# Patient Record
Sex: Male | Born: 1935
Health system: Southern US, Community
[De-identification: ages and names within clinical notes are randomized; demographics above are authoritative.]

## PROBLEM LIST (undated history)

## (undated) DIAGNOSIS — I252 Old myocardial infarction: Secondary | ICD-10-CM

## (undated) DIAGNOSIS — D649 Anemia, unspecified: Secondary | ICD-10-CM

## (undated) DIAGNOSIS — N183 Chronic kidney disease, stage 3 unspecified: Secondary | ICD-10-CM

## (undated) DIAGNOSIS — K219 Gastro-esophageal reflux disease without esophagitis: Secondary | ICD-10-CM

## (undated) DIAGNOSIS — Z87440 Personal history of urinary (tract) infections: Secondary | ICD-10-CM

## (undated) DIAGNOSIS — Z973 Presence of spectacles and contact lenses: Secondary | ICD-10-CM

## (undated) DIAGNOSIS — Z872 Personal history of diseases of the skin and subcutaneous tissue: Secondary | ICD-10-CM

## (undated) DIAGNOSIS — M1990 Unspecified osteoarthritis, unspecified site: Secondary | ICD-10-CM

## (undated) DIAGNOSIS — Z860100 Personal history of colon polyps, unspecified: Secondary | ICD-10-CM

## (undated) DIAGNOSIS — F419 Anxiety disorder, unspecified: Secondary | ICD-10-CM

## (undated) DIAGNOSIS — I5030 Unspecified diastolic (congestive) heart failure: Secondary | ICD-10-CM

## (undated) DIAGNOSIS — I272 Pulmonary hypertension, unspecified: Secondary | ICD-10-CM

## (undated) DIAGNOSIS — I4819 Other persistent atrial fibrillation: Secondary | ICD-10-CM

## (undated) DIAGNOSIS — I714 Abdominal aortic aneurysm, without rupture: Secondary | ICD-10-CM

## (undated) DIAGNOSIS — Z951 Presence of aortocoronary bypass graft: Secondary | ICD-10-CM

## (undated) DIAGNOSIS — E785 Hyperlipidemia, unspecified: Secondary | ICD-10-CM

## (undated) DIAGNOSIS — C61 Malignant neoplasm of prostate: Secondary | ICD-10-CM

## (undated) DIAGNOSIS — Z87448 Personal history of other diseases of urinary system: Secondary | ICD-10-CM

## (undated) DIAGNOSIS — I10 Essential (primary) hypertension: Secondary | ICD-10-CM

## (undated) DIAGNOSIS — R0600 Dyspnea, unspecified: Secondary | ICD-10-CM

## (undated) DIAGNOSIS — N400 Enlarged prostate without lower urinary tract symptoms: Secondary | ICD-10-CM

## (undated) DIAGNOSIS — I340 Nonrheumatic mitral (valve) insufficiency: Secondary | ICD-10-CM

## (undated) DIAGNOSIS — N201 Calculus of ureter: Secondary | ICD-10-CM

## (undated) DIAGNOSIS — I4892 Unspecified atrial flutter: Secondary | ICD-10-CM

## (undated) DIAGNOSIS — R319 Hematuria, unspecified: Secondary | ICD-10-CM

## (undated) DIAGNOSIS — N200 Calculus of kidney: Secondary | ICD-10-CM

## (undated) DIAGNOSIS — I951 Orthostatic hypotension: Secondary | ICD-10-CM

## (undated) DIAGNOSIS — I2510 Atherosclerotic heart disease of native coronary artery without angina pectoris: Secondary | ICD-10-CM

## (undated) DIAGNOSIS — R0609 Other forms of dyspnea: Secondary | ICD-10-CM

## (undated) DIAGNOSIS — I491 Atrial premature depolarization: Secondary | ICD-10-CM

## (undated) DIAGNOSIS — I255 Ischemic cardiomyopathy: Secondary | ICD-10-CM

## (undated) DIAGNOSIS — R911 Solitary pulmonary nodule: Secondary | ICD-10-CM

## (undated) DIAGNOSIS — N210 Calculus in bladder: Secondary | ICD-10-CM

## (undated) DIAGNOSIS — Z978 Presence of other specified devices: Secondary | ICD-10-CM

## (undated) DIAGNOSIS — R011 Cardiac murmur, unspecified: Secondary | ICD-10-CM

## (undated) DIAGNOSIS — J189 Pneumonia, unspecified organism: Secondary | ICD-10-CM

## (undated) HISTORY — PX: CARDIOVASCULAR STRESS TEST: SHX262

## (undated) HISTORY — PX: CORONARY ARTERY BYPASS GRAFT: SHX141

## (undated) HISTORY — PX: INGUINAL HERNIA REPAIR: SUR1180

## (undated) HISTORY — DX: Atherosclerotic heart disease of native coronary artery without angina pectoris: I25.10

## (undated) HISTORY — DX: Gastro-esophageal reflux disease without esophagitis: K21.9

## (undated) HISTORY — DX: Hyperlipidemia, unspecified: E78.5

## (undated) HISTORY — DX: Chronic kidney disease, stage 3 (moderate): N18.3

## (undated) HISTORY — DX: Unspecified atrial flutter: I48.92

## (undated) HISTORY — DX: Nonrheumatic mitral (valve) insufficiency: I34.0

## (undated) HISTORY — DX: Essential (primary) hypertension: I10

## (undated) HISTORY — DX: Chronic kidney disease, stage 3 unspecified: N18.30

## (undated) HISTORY — DX: Orthostatic hypotension: I95.1

## (undated) HISTORY — PX: CARDIAC CATHETERIZATION: SHX172

## (undated) HISTORY — DX: Unspecified diastolic (congestive) heart failure: I50.30

## (undated) HISTORY — DX: Solitary pulmonary nodule: R91.1

## (undated) HISTORY — DX: Anxiety disorder, unspecified: F41.9

---

## 1995-07-29 HISTORY — PX: OTHER SURGICAL HISTORY: SHX169

## 2005-03-30 ENCOUNTER — Inpatient Hospital Stay (HOSPITAL_COMMUNITY): Admission: EM | Admit: 2005-03-30 | Discharge: 2005-04-10 | Payer: Self-pay | Admitting: Emergency Medicine

## 2005-03-30 DIAGNOSIS — I252 Old myocardial infarction: Secondary | ICD-10-CM

## 2005-03-30 HISTORY — DX: Old myocardial infarction: I25.2

## 2005-04-04 DIAGNOSIS — Z951 Presence of aortocoronary bypass graft: Secondary | ICD-10-CM

## 2005-04-04 HISTORY — DX: Presence of aortocoronary bypass graft: Z95.1

## 2006-06-11 ENCOUNTER — Ambulatory Visit (HOSPITAL_COMMUNITY): Admission: RE | Admit: 2006-06-11 | Discharge: 2006-06-11 | Payer: Self-pay | Admitting: Urology

## 2010-07-24 ENCOUNTER — Ambulatory Visit: Payer: Self-pay | Admitting: Cardiology

## 2010-08-16 ENCOUNTER — Ambulatory Visit: Payer: Self-pay | Admitting: Cardiology

## 2010-11-28 ENCOUNTER — Other Ambulatory Visit: Payer: Self-pay | Admitting: Cardiology

## 2010-11-28 DIAGNOSIS — E785 Hyperlipidemia, unspecified: Secondary | ICD-10-CM

## 2010-12-13 NOTE — Op Note (Signed)
NAMEWOLFE, CAMARENA            ACCOUNT NO.:  [id number]   MEDICAL RECORD NO.:  [id number]          PATIENT TYPE:  INP   LOCATION:  2311                         FACILITY:  [facility]   PHYSICIAN:  Salvatore Decent. Cornelius Moras, M.D. DATE OF BIRTH:  [birth year]   DATE OF PROCEDURE:  04/04/2005  DATE OF DISCHARGE:                                 OPERATIVE REPORT   PREOPERATIVE DIAGNOSIS:  Severe three-vessel coronary artery disease, status  post acute non-ST segment elevation myocardial infarction.   POSTOPERATIVE DIAGNOSIS:  Severe three-vessel coronary artery disease,  status post acute non-ST segment elevation myocardial infarction.   PROCEDURE:  Median sternotomy for coronary artery bypass grafting x5 (left  internal mammary artery to distal left anterior descending coronary artery,  saphenous vein graft to ramus intermediate branch, saphenous vein graft to  first circumflex marginal branch and sequential saphenous vein graft to  second left posterolateral branch, saphenous vein graft to posterior  descending coronary artery, endoscopic saphenous vein harvest from right  thigh and right lower leg).   SURGEON:  Salvatore Decent. Cornelius Moras, M.D.   ASSISTANT:  Rowe Clack, P.A.-C.   ANESTHESIA:  General.   BRIEF CLINICAL NOTE:  The patient is a 75 year old male who presented with  an incarcerated left inguinal hernia on March 30, 2005.  He underwent  emergency repair of his inguinal hernia using mesh by Dr. Glenna Fellows.  Immediately following surgery, the patient complained of substernal chest  pain.  EKG findings are equivocal and the patient's symptoms resolved.  However, on serial cardiac enzymes the patient is noted to have ruled in for  an acute non-ST segment elevation myocardial infarction.  The patient is  anticoagulated with heparin.  He remained stable for the ensuing 72 hours  and ultimately he underwent elective cardiac catheterization by Dr. Delfin Edis on September 6.  Findings  at the time of catheterization demonstrate  severe three-vessel coronary artery disease with normal left ventricular  function.  A full consultation note has been dictated previously.  Subsequent to consultation, the patient's heparin is discontinued due to the  presence of hematoma in the patient's left groin and scrotum associated with  his inguinal hernia repair.  He remained entirely stable from a cardiac  standpoint and never had any recurrent episodes of chest pain.  Over the  ensuing 48 hours, the patient has remained clinically stable.  He has been  afebrile with normal white blood count.  There have been signs of bowel  activity, although the patient has not had a bowel movement.  His abdominal  exam has been benign.  No other abnormalities are appreciated.  The patient  is now brought to the operating room for elective surgical  revascularization.   OPERATIVE CONSENT:  The patient has been counseled at length regarding the  indications and potential benefits of coronary artery bypass grafting.  Alternative treatment strategies have been discussed.  The patient  understands and accepts all associated risks of surgery including but not  limited to risk of death, stroke, myocardial infarction, congestive heart  failure, respiratory failure, pneumonia, bleeding requiring blood  transfusion, arrhythmia, infection,  and recurrent coronary artery disease.  The patient also understands that he may be at somewhat increased risk for  postoperative complications due to the presence of a large hematoma in the  left groin and scrotum with recent left inguinal hernia repair and mild  postoperative ileus.  All of his questions have been addressed.   OPERATIVE NOTE IN DETAIL:  The patient is brought to the operating room on  the above-mentioned date and placed in the supine position on the operating  table.  Central monitoring is established by the Anesthesia Service  initially under the care  and direction of Dr. Sharee Holster.  Specifically,  a Swan-Ganz catheter is placed through the right internal jugular approach.  A radial arterial line is placed.  Intravenous antibiotics are administered.  Following induction with general endotracheal anesthesia, the patient's  chest, abdomen, both groins and both lower extremities are prepared and  draped in a sterile manner. The existing Foley catheter that was placed  prior to surgery was left in place and not exchanged due to significant  penile and scrotal edema.   Of note, after induction the patient was noted to develop a low-grade fever  with a temperature of 38.3 degrees centigrade.   Baseline transesophageal echocardiogram is performed by Dr. Jacklynn Bue.  This  demonstrates normal left ventricular function.  There is trivial mitral  regurgitation.  There are no significant wall motion abnormalities.  No  other significant abnormalities are appreciated.   A median sternotomy incision is performed and the left internal mammary  artery is dissected from the chest wall and prepared for bypass grafting.  The left internal mammary artery is good-quality conduit.  Simultaneously saphenous vein is obtained from the patient's right thigh and  the upper portion of the right lower leg using endoscopic vein harvest  incision through a small incision made just below the right knee.  Several  additional incisions are made in the lower leg to facilitate adequate  saphenous vein for conduit.  After the saphenous vein is removed, all of the  small surgical incisions in the right lower extremity are closed in multiple  layers with running absorbable suture.  The saphenous vein conduit is felt  to be good-quality.   The patient is heparinized systemically.  The pericardium is opened.  The  ascending aorta is normal in appearance.  The ascending aorta and the right atrium are cannulated for cardiopulmonary bypass.  Adequate heparinization is  verified.  Cardiopulmonary bypass is begun and  the surface of the heart is inspected.  There is codominant coronary  circulation with a small posterior descending coronary artery arising off  the distal right coronary artery.  All of the remaining left ventricular  branches to the inferior wall and posterolateral wall are branches off the  distal left circumflex coronary artery.  Distal sites are selected for  coronary bypass grafting.  Portions of saphenous vein and left internal  mammary artery are trimmed to appropriate lengths.  A temperature probe is  placed in the left ventricular septum.  A cardioplegia catheter is placed in the ascending aorta.   The patient is cooled to 34 degrees systemic temperature.  The aortic  crossclamp is applied and cardioplegia is delivered in an antegrade fashion  through the aortic root.  Iced saline slush is applied for topical  hypothermia.  The initial cardioplegic arrest and myocardial cooling are  felt to be excellent.  Repeat doses of cardioplegia are administered intermittently throughout the  crossclamp portion  of the operation through the aortic root and down the  subsequently-placed vein grafts to maintain septal temperature less than 15  degrees centigrade.   The following distal coronary anastomoses are performed:  1.  The posterior descending coronary artery is grafted with a saphenous      vein graft in an end-to-side fashion.  This vessel measures 1.4 mm in      diameter and is of fair to good quality at the site of distal bypass.  2.  The first circumflex marginal branch is grafted with a saphenous vein      graft in a side-to-side fashion.  This vessel measures 1.1 mm in diameter and is a fair-quality target.  1.  The second posterolateral branch off the distal left circumflex coronary      artery is grafted using a sequential saphenous vein graft off of the      vein placed at this circumflex marginal branch.  This posterolateral       branch is the largest and terminal-most branch off the distal left      circumflex coronary artery.  At the site of distal bypass it measures      1.8 mm in diameter.  It is a fair to good-quality target.  2.  The ramus intermediate branch is grafted with a saphenous vein graft in      an end-to-side fashion.  This vessel measures 1.8 mm in diameter and is      good quality at the site of distal bypass.  3.  The distal left anterior descending coronary artery is grafted with the      left internal mammary artery in an end-to-side fashion.  This vessel is      diffusely diseased.  At the site of distal bypass it measures 1.7 mm in      diameter.  A 1.0 probe will pass to the apex of the heart  but a 1.5      probe will not due to diffuse disease both proximal and distal to the      level of the distal anastomosis.  This vessel is chronically occluded      proximally.  All three proximal saphenous vein anastomoses are performed directly to the  ascending aorta prior to removal of the aortic crossclamp.  The left  ventricular septal temperature rises rapidly with reperfusion of the left  internal mammary artery.  The aortic crossclamp is removed after a total  crossclamp time of 76 minutes.   The heart began to beat spontaneously without need for cardioversion.  All  proximal and distal coronary anastomoses are inspected for hemostasis and  appropriate graft orientation.  Epicardial pacing wires are fixed right  ventricular outflow tract and to the right atrial appendage.  The patient is  rewarmed to 36.5 degrees centigrade temperature.  The patient is weaned from  cardiopulmonary bypass without difficulty.  The patient's rhythm at  separation from bypass is normal sinus rhythm.  No inotropic support is  required.  Total cardiopulmonary bypass time for the operation is 95  minutes.  Follow-up transesophageal echocardiogram performed by Dr. Gelene Mink after  separation from bypass  demonstrates normal left ventricular function with no  significant abnormalities.   The venous and arterial cannulae are removed uneventfully.  Protamine is  administered to reverse the anticoagulation.  The mediastinum and the left  chest are irrigated with saline solution containing vancomycin.  Meticulous  surgical hemostasis ascertained.  The mediastinum and left chest are  drained  using three chest tubes exited through separate stab incisions inferiorly.  The median sternotomy is closed with single-strength sternal wire after  loosely reapproximating the pericardium anteriorly.  The soft tissues  anterior to the sternum are closed in multiple layers and the skin is closed  with a running subcuticular skin closure.   The patient tolerated the procedure well and is transported to the surgical  intensive care unit in stable condition.  There are no intraoperative  complications.  All sponge, instrument and needle counts are verified  correct at completion of the operation.  The patient was transfused total of  4 units packed red blood cells during cardiopulmonary bypass due to anemia  which is present preoperatively and exacerbated by surgery.      Salvatore Decent. Cornelius Moras, M.D.  Electronically Signed     CHO/MEDQ  D:  04/04/2005  T:  04/04/2005  Job:  [id number]   cc:   Colleen Can. Deborah Chalk, M.D.  Fax: [phone]   Lorne Skeens. Hoxworth, M.D.  [address]  [place]  [place] [address]   Meindert Lacie Scotts  [address]  [place]  [place] [address]  Fax: [phone]

## 2010-12-13 NOTE — Op Note (Signed)
NAMEHARSHIL, CAVALLARO NO.:  [id number]   MEDICAL RECORD NO.:  [id number]          PATIENT TYPE:  INP   LOCATION:  2399                         FACILITY:  [facility]   PHYSICIAN:  Burna Forts, M.D.DATE OF BIRTH:  [birth year]   DATE OF PROCEDURE:  04/04/2005  DATE OF DISCHARGE:                                 OPERATIVE REPORT   PROCEDURE:  Interoperative transesophageal echocardiogram.   INDICATIONS FOR PROCEDURE:  Mr. Foerster is a 75 year old gentleman who  underwent left inguinal hernia repair and was subsequently found to have  severe three vessel coronary artery disease.  He presents today for coronary  artery bypass grafting by Dr. Tressie Stalker who has requested that we place  a TEE probe for evaluation for cardiac structures and function.   DESCRIPTION OF PROCEDURE:  The patient was taken to the operating room for  routine induction of general anesthesia whereupon the trachea was intubated.  Following this, the TEE probe was lubricated and passed oropharyngeally into  the stomach and withdrawn for imaging of the cardiac structures.   PRE-CORONARY BYPASS EXAMINATION:   Left ventricle:  This is normal left ventricular chamber size, function, and  shape.  Short axis views revealed good overall contractility, good segmental  wall contractility in all aspects.  Papillary muscles are well outlined.  Long axis views, again, revealed good inferior and good anterior  contractility in the left ventricular wall well down into the apex.  Good  overall contractility noted.   Mitral valve:  This is a normal, compliant, mobile mitral valve apparatus.  Thin, compliant mobile leaflets are noted.  There is trace mitral  regurgitant flow.   Left atrium:  Normal left atrial chamber.   Aortic valve:  Thin, compliant, trileaflet aortic valve, opened well during  systolic contraction, there is no systolic obstruction to flow nor is there  any aortic insufficiency  noted.   Right ventricle, tricuspid valve, and the right atrium are entirely normal.  This is an entirely normal exam.  The patient will be subsequently bypassed  by Dr. Cornelius Moras.  The rest of the dictation, if any, will be followed by Dr.  Gelene Mink in the post bypass period.           ______________________________  Burna Forts, M.D.     JTM/MEDQ  D:  04/04/2005  T:  04/04/2005  Job:  [id number]

## 2010-12-13 NOTE — Cardiovascular Report (Signed)
NAMEDREUX, MCGROARTY NO.:  [id number]   MEDICAL RECORD NO.:  [id number]          PATIENT TYPE:  INP   LOCATION:  2923                         FACILITY:  [facility]   PHYSICIAN:  Colleen Can. Deborah Chalk, M.D.DATE OF BIRTH:  [birth year]   DATE OF PROCEDURE:  04/02/2005  DATE OF DISCHARGE:                              CARDIAC CATHETERIZATION   HISTORY:  Mr. Zwart presented initially with incarcerated left inguinal  hernia. In the immediate postoperative phase, he had chest pain and had rise  in his cardiac enzymes consistent with subendocardial infarction. He was  placed on heparin with plans to proceed on with cardiac catheterization. He  developed a left groin hematoma over the last 12 to 24 hours and presented  to the catheterization lab for elective catheterization but with a prominent  left groin hematoma. He is now referred for elective catheterization.   PROCEDURE:  Left heart catheterization with selective coronary angiography,  left ventricular angiography with AngioSeal.   TYPE AND SITE OF ENTRY:  Percutaneous right femoral artery with AngioSeal.   CATHETERS:  A 6-French 4 curved Judkins right and left coronary catheter, 6-  French pigtail ventriculographic catheter.   CONTRAST MATERIAL:  Omnipaque.   MEDICATIONS:  Given prior procedure, Valium 10 milligrams p.o.   MEDICATIONS:  Given during the procedure, Versed 2 milligrams IV, Ancef 1  gram IV.   COMMENT:  The patient tolerated the procedure well.   HEMODYNAMIC DATA:  The aortic pressure was 80/60. LV was 100/2-6. There is  no aortic valve gradient on pullback.   ANGIOGRAPHIC DATA:  1.  Left main coronary artery had 30-40% left main stenosis. It is somewhat      diffuse.  2.  Left circumflex: Left circumflex was large and codominant. It had a 60-      70% proximal lesion left circumflex at the level of a smaller obtuse      marginal.  3.  Intermediate:  Intermediate is a relatively large branch  with multiple      distal branches. It had 95% proximal stenosis.  4.  Left anterior descending:  Left anterior descending was totally occluded      proximally. There is retrograde collaterals from the left system. It      would be bypassable.  5.  Right coronary artery:  The right coronary artery has a 99% stenosis      with thrombus present. It is TIMI grade III flow into bypassable      pressure lateral branch.   Left ventricular function was essentially normal. The ejection fraction is  55%.   AngioSeal was used for right groin with satisfactory results.   OVERALL IMPRESSION:  1.  Critical three-vessel coronary disease.  2.  Essentially normal left ventricular function.   DISCUSSION:  I think Mr. Schaffert is in a critical situation. We have had  consultation with general surgery as well as with CVTS. It is a feeling that  emergency bypass surgery would be too risky with his groin hematoma and that  it is best to do the surgery on an elective basis. We will try to manage  him  with IV fluids appropriate and transfusions as needed. Heparin and  anticoagulation will be very problematic because of the significance of the  hematoma in the left groin. The high risk situation will be discussed with  the patient's family.      Colleen Can. Deborah Chalk, M.D.  Electronically Signed     SNT/MEDQ  D:  04/02/2005  T:  04/02/2005  Job:  [id number]

## 2010-12-13 NOTE — Consult Note (Signed)
NAMERONI, SCOW NO.:  [id number]   MEDICAL RECORD NO.:  [id number]          PATIENT TYPE:  INP   LOCATION:  2923                         FACILITY:  [facility]   PHYSICIAN:  Salvatore Decent. Cornelius Moras, M.D. DATE OF BIRTH:  25-Nov-1935   DATE OF CONSULTATION:  04/02/2005  DATE OF DISCHARGE:                                   CONSULTATION   REQUESTING PHYSICIAN:  Dr. Delfin Edis.   REASON FOR CONSULTATION:  Severe three-vessel coronary artery disease,  status post non ST-segment elevation myocardial infarction.   HISTORY OF PRESENT ILLNESS:  Mr. Trentman is a 75 year old white male with  no previous history of coronary artery disease but risk factors notable for  history of hypertension and hyperlipidemia. The patient states in retrospect  that he has been having occasional episodes of substernal chest discomfort  off and on for at least six months. His wife also interjects that several  weeks ago he had a prolonged episode of substernal chest pain radiating down  his left arm. He did not seek medical attention for it at that time and he  blamed his symptoms on his presumed hiatal hernia. Mr. Goudeau is also  known to have a left inguinal hernia for several months but elected not to  have elective surgical repair. On September3, 2006, the patient presented  with an incarcerated left inguinal hernia and he was taken to the operating  room emergently by Dr. Johna Sheriff for repair of his inguinal hernia. In the  postoperative recovery, early following surgery, Mr. Barthelemy developed  substernal chest pain. A 12-lead electrocardiograms revealed some mild and  nonspecific ST-segment changes but no significant acute changes were  appreciated. However, Mr. Mccook did rule in for an acute non-ST-segment  myocardial infarction by serial cardiac enzymes. His peak cardiac enzymes  were notable for a total CK of 888 with a CK-MB fraction of 59.9. The  troponin-I peaked at 6.3. Since  that period time, Mr. Specht has remained  entirely stable from a clinical standpoint with no further episodes of chest  pain and the last set of cardiac enzymes were trending down towards normal.  He was brought for elective cardiac catheterization today by Dr. Deborah Chalk.  Findings at the time of catheterization demonstrate severe three-vessel  coronary artery disease with normal left ventricular function. Emergent  cardiac surgical consultation was requested to consider emergent surgical  revascularization.   REVIEW OF SYSTEMS:  GENERAL: Mr. Morad reports that he has otherwise felt  reasonably well recently. He has good appetite and has not been gaining or  losing weight. CARDIAC: The patient does admit in retrospect of having  exertional chest pain for at least six months and it sounds as though he may  have had a prolonged episode of chest pain occurring at rest several weeks  ago according to his wife. He denies shortness of breath with activity or at  rest. He denies productive cough, hemoptysis, PND, orthopnea, or lower  extremity edema. He denies palpitations or syncope. RESPIRATORY: Notable for  the absence of productive cough, hemoptysis, wheezing. GASTROINTESTINAL:  Notable for the absence of significant problems  prior to this week. However,  since his recent hernia surgery repair, Mr. Kosman has yet to develop  bowel function. He claims that he did pass a small amount of flatus this  morning but he has not had a bowel movement. He has been tolerating small  amounts of clear liquids fluids p.o. without nausea and vomiting.  MUSCULOSKELETAL: Negative. NEUROLOGIC: Negative. GENITOURINARY: Notable for  history of prostate cancer, although Mr. Carll has been out ambulating  and voiding reportedly without difficulty over the last two days. HEENT:  Negative. INFECTIOUS: Negative.   PAST MEDICAL HISTORY:  1.  Hypertension.  2.  Hyperlipidemia.  3.  Prostate cancer, status  post prostatectomy followed by radiation seed      implantation.  4.  Anxiety.  5.  Left inguinal hernia, status post emergent inguinal hernia repair using      mesh September 3rd by Dr. Johna Sheriff for incarcerated inguinal hernia      (without compromise to the intestines and without need for valve      resection).   FAMILY HISTORY:  Noncontributory. Notable for the absence of premature  coronary artery disease.   MEDICATIONS PRIOR TO ADMISSION:  1.  Aspirin.  2.  Prevacid.  3.  Lotrel.  4.  Lovastatin.  5.  Celebrex.   DRUG ALLERGIES:  None known.   PHYSICAL EXAMINATION:  The patient is a well-appearing male who appears his  stated age in no acute distress. He is currently in sinus rhythm with normal  blood pressure. He is resting comfortably and denies any chest pain.  Auscultation of chest reveals clear and symmetrical breath sounds  bilaterally. No wheezes or rhonchi noted. Cardiovascular exam includes  regular rate and rhythm. No murmurs, rubs or gallops noted. The abdomen is  soft and nontender. There are a few bowel sounds, although they are slightly  hypoactive. There is no tenderness or guarding. There is a clean, dry and  intact surgical incision from recent left inguinal hernia repair. There is  moderate amount of swelling with moderate-sized hematoma extending down into  the scrotum. The extremities are warm and well-perfused. There is no lower  extremity edema. Distal pulses are palpable in both lower legs at the ankle.  Rectal and GU exam are both deferred. Neurologic examination is grossly  nonfocal. The skin is clean, dry healthy-appearing throughout.   DIAGNOSTIC TEST:  Cardiac catheterization performed by Dr. Deborah Chalk is  reviewed. This demonstrates severe three-vessel coronary artery disease with  normal left ventricular function. Specifically, there is 100% proximal  occlusion of the left anterior descending coronary artery. This appears chronic with diffusely  diseased distal left anterior descending coronary  artery that can be seen filling via chronic collaterals through the left  circumflex system. The left circumflex coronary artery is a very large and  dominant vessel that gives rise to a medium-sized first circumflex marginal  branch. There is 60% stenosis in this vessel. There is a few small  circumflex marginal branch beyond the first circumflex marginal branch and  then two very large posterolateral branches off the distal left circumflex  coronary artery that appeared to perfuse the majority of the inferior wall  of the left ventricle. There is no significant disease in these vessels. The  right coronary artery was codominant and relatively small perfusing a small  posterior descending coronary artery. There is subtotal occlusion of the  right coronary artery with sluggish flow in this vessel, TIMI grade I to II.  The distal right  coronary artery and/or posterior descending coronary artery  probably will be graftable although it appears relatively small. Left  ventricular function appears normal with no significant wall motion  abnormalities.   IMPRESSION:  Severe three-vessel coronary artery disease status post recent  non ST-segment elevation acute myocardial infarction during the immediate  postoperative recovery phase following emergent repair of incarcerated  inguinal hernia three days ago. Mr. Sacks has remained entirely stable  from a cardiac standpoint since then with no further episodes of chest pain  and no acute changes on 12-lead electrocardiogram. He does have critical  coronary anatomy that would be unfavorable for percutaneous coronary  intervention and I believe in the long run this coronary artery disease  would best be treated by surgical revascularization. He has had recent  emergency surgical repair of incarcerated inguinal hernia. He does appear to  have a moderate size hematoma in the left groin associated with  this hernia  repair, likely secondary to the recent emergency surgery with subsequent  anticoagulation with heparin. However, this hematoma is not tense and the  swelling is not unexpected under the circumstances. The time course for the  development of this hematoma is not clear.  Mr. Rabine has had a mild  postoperative ileus which is not unexpected under the circumstances, but his  bowel function has not yet returned towards normal. I do not feel that it  would be in Mr. Barbone's best interest to rush him off for emergency  surgical revascularization. However, I do agree that coronary artery bypass  grafting will be the best way to treat his coronary disease and it certainly  must be done prior to hospital discharge.  Percutaneous coronary  intervention for the subtotal occlusion of the right coronary artery is an  option, particularly if the patient were to develop signs of unstable post- infarction angina or acute coronary occlusion.  However, I do not feel this  is neccessary at the present time.   RECOMMENDATIONS:  I recommend close observation in the cardiac intensive  care unit. I suspect that stopping heparin will not exacerbate his cardiac  status, but I will defer decisions regarding anticoagulation at present to  discussions between Dr. Deborah Chalk and Dr. Johna Sheriff. Mr. Capers will require  a large dose of heparin and full anticoagulation when he goes to the  operating room for surgical revascularization. Once he is stabilized from  this standpoint of his inguinal hernia repair, we can consider elective  coronary artery bypass grafting. Certainly, if he develops signs of cardiac  instability we may need to think about proceeding with coronary bypass  surgery sooner, although PCI of the RCA might be more prudent should that  occur. We will follow along closely.      Salvatore Decent. Cornelius Moras, M.D.  Electronically Signed     CHO/MEDQ  D:  04/02/2005  T:  04/03/2005  Job:   [id number]   cc:   Colleen Can. Deborah Chalk, M.D.  Fax: [phone]   Lorne Skeens. Hoxworth, M.D.  [address], [address]  [place]  [place] [address]   Meindert Lacie Scotts  [address]  [place]  [place] [address]  Fax: [phone]

## 2010-12-13 NOTE — Discharge Summary (Signed)
NAMEGABRIEN, MENTINK NO.:  [id number]   MEDICAL RECORD NO.:  [id number]          PATIENT TYPE:  INP   LOCATION:  2037                         FACILITY:  [facility]   PHYSICIAN:  Salvatore Decent. Cornelius Moras, M.D. DATE OF BIRTH:  10-Oct-1935   DATE OF ADMISSION:  03/30/2005  DATE OF DISCHARGE:  04/10/2005                                 DISCHARGE SUMMARY   HISTORY OF THE PRESENT ILLNESS:  The patient is a 75 year old male with a  known inguinal hernia on the left side for several years.  He noted a bulge  that was intermittent, sometimes uncomfortable, but decided not to seek  medical treatment.  He presented to the emergency room on the date of  admission with a several-hour history of a persistent painful mass in the  left groin.  There was no nausea or vomiting, no urinary difficulties.  Prompt surgical consultation was obtained with Dr. Johna Sheriff, whose  assessment included incarcerated left inguinal hernia and the patient was to  be taken to the operating room for emergency repair under general  anesthesia.   PAST MEDICAL HISTORY:  1.  History of prostate cancer, status post radioactive seed implant.  2.  Hypertension.  3.  Hypercholesterolemia.  4.  Anxiety.   MEDICATIONS PRIOR TO ADMISSION:  Aspirin, Prevacid, Lotrel, Lovastatin and  Celebrex.   ALLERGIES:  Unknown pain medication.   FAMILY HISTORY:  Please see the history and physical done at the time of  admission.   SOCIAL HISTORY:  Please see the history and physical done at the time of  admission.   REVIEW OF SYSTEMS:  Please see the history and physical done at the time of  admission.   PHYSICAL EXAMINATION:  Please see the history and physical done at the time  of admission.   HOSPITAL COURSE:  The patient was admitted through the emergency room and  taken to the operating room, where he underwent a repair of an incarcerated  left inguinal hernia with a Parietex mesh by Dr. Johna Sheriff.  There were no  complications and the patient presented to the postanesthesia care unit,  however, with complaints of substernal and epigastric burning-type pain.  An  EKG was obtained as well as Cardiology consultation by Dr. Lemmie Evens.  Impression was chest pain, rule out cardiac ischemia, and plan was outlined  to include telemetry, cardiac enzymes, aspirin, intravenous nitroglycerin,  low-dose Lovenox, after clearing with Dr. Johna Sheriff, metoprolol and  consultation with Dr. Roger Shelter.  The patient was noted to have  elevation of troponin and CK isoenzymes to a peak in troponin I of 1.39 and  a CK-MB of 43.2.  He was stabilized medically, placed on heparin and felt to  require cardiac catheterization, and this was undertaken on April 02, 2005; this was performed by Dr. Deborah Chalk.  The patient subsequently also  developed a large inguinal hernia site hematoma as well as scrotal hematoma  that necessitated the discontinuation of heparin.  Findings at  catheterization revealed the following:  #1 - Left main coronary artery with  a 30% to 40% stenosis.  #2 - The left  circumflex with a 60% to 70% proximal  circumflex and it was a codominant vessel.  #3 - The intermediate vessel had  a 95% stenosis.  #4 - The LAD had 100% proximal with retrograde collateral  flow from the left side.  #5 - The right coronary artery had a 99% stenosis  with evidence of thrombus.  Due to these findings, it was felt that surgical  revascularization was warranted and consultation was obtained with Dr.  Tressie Stalker, who evaluated the patient and studies, and agreed with the  recommendations.  The patient also was noted to have had difficulty in  placing of the Foley catheter and consultation was obtained with Dr. Isabel Caprice  on April 02, 2005.  After the patient was deemed to be medically stable  and all preoperative evaluation was undertaken, it was felt he was able to  proceed with the cardiac surgery and on  April 04, 2005, he underwent the  following procedure:  Coronary artery bypass grafting x5.  The following  grafts were placed:  #1 - Left internal mammary artery to the LAD, #2 -  saphenous vein graft to the intermediate, #3 - sequential saphenous vein  graft to the obtuse marginal 1 and the left posterolateral #2, and #4 -  saphenous vein graft to the posterior descending.  The patient tolerated the  procedure well and was taken to the surgical intensive care unit in stable  condition.   POSTOPERATIVE HOSPITAL COURSE:  The patient has done well, maintaining  stable hemodynamics.  He did have some sinus tachycardia and beta blocker  was adjusted with good improvement.  The patient had all routine lines,  monitors and drainage devices discontinued through the standard fashion.  His hematoma related to his hernia surgery has shown a good and gradual  improvement with decrease in ecchymosis and swelling.  Followup  Genitourinary consultation with Dr. Isabel Caprice was also obtained and other  issues have presented.  He is noted to have a renal calculus which appears  to be chronic and can be treated as an outpatient.  A CT scan was obtained  in regards to this and it was not felt to require any urgent followup.  The  CT did, however, show some findings that may be consistent with metastatic  implants related to his prostate cancer.  A bone scan is to be obtained on  April 09, 2005 prior to discharge to see if there are any metastatic  bony foci.  This patient is voiding with the Foley catheter out without  difficulty.  He also ruled out on evaluation for deep venous thrombosis by  Duplex and CT scan.  The patient is overall felt to be quite stable for  discharge on today's date, again following the bone scan, April 10, 2005.   MEDICATIONS ON DISCHARGE:  1.  Aspirin 325 mg daily.  2.  Lopressor 50 mg b.i.d.  3.  Lovastatin 20 mg nightly.  4.  Prevacid 30 mg daily. 5.  Lasix 40 mg  five times daily.  6.  Potassium chloride 20 mEq daily x5 days.  7.  Diazepam as previously taken at home.  8.  Tylox one or two every 4-6 hours as needed for pain.   INSTRUCTIONS:  The patient received written instructions regarding  medications, activity, diet, wound care and followup.   FOLLOWUP:  Followup will include Dr. Johna Sheriff on April 25, 2005 at 3:20  p.m.  Additionally, he is instructed to follow up with Dr. Ellin Goodie  office  and Dr. Ronnald Nian office as written in his instructions.  Additionally, he  will see Dr. Cornelius Moras on April 28, 2005 at 2 p.m.   FINAL DIAGNOSES:  1.  Admission for incarcerated left inguinal hernia, status post repair with      mesh.  2.  Perioperative myocardial infarction.  3.  Severe three-vessel coronary artery disease, status post non-ST-segment-      elevation myocardial infarction.  4.  Hypertension.  5.  Hyperlipidemia.  6.  Prostate cancer with possible metastatic disease to be followed up as an      outpatient with Dr Isabel Caprice.  7.  Left inguinal hernia site repair hematoma secondary to systemic      heparinization, improving.  8.  Anxiety.  9.  Renal calculi.      Rowe Clack, P.A.-C.      Salvatore Decent. Cornelius Moras, M.D.  Electronically Signed    WEG/MEDQ  D:  04/10/2005  T:  04/10/2005  Job:  [id number]   cc:   Lorne Skeens. Hoxworth, M.D.  [address]  [place]  [place] [address]   Salvatore Decent. Cornelius Moras, M.D.  [address]  [place]  [place] [address]   Colleen Can. Deborah Chalk, M.D.  Fax: [phone]   Valetta Fuller, M.D.  [address]  [place]  [place] [address]  Fax: [phone]

## 2010-12-13 NOTE — Consult Note (Signed)
NAMEARDIE, MCLENNAN NO.:  [id number]   MEDICAL RECORD NO.:  [id number]          PATIENT TYPE:  INP   LOCATION:  4702                         FACILITY:  [facility]   PHYSICIAN:  Ulyses Amor, MD DATE OF BIRTH:  [birth year]   DATE OF CONSULTATION:  03/30/2005  DATE OF DISCHARGE:                                   CONSULTATION   Carl [name] is a 75 year old white man who underwent repair of an  incarcerated inguinal hernia today.  In the recovery room he began to  experience chest pain.  Over the ensuing hours, his chest pain waxed and  waned.  The chest pain is described as a pressure along the length of the  sternum.  It has not radiated.  It is not associated with dyspnea,  diaphoresis, or nausea. There are no exacerbating or ameliorating factors.  It appears not be related to position, activity, meals, or respirations.  It  has subsided somewhat since earlier this evening.   The patient has no past history of cardiac disease, including no history of  chest pain, myocardial infarction, coronary artery disease, congestive heart  failure, or arrhythmia.  He has a number of risk factors for coronary artery  disease including hypertension and dyslipidemia.  There is no history of  diabetes mellitus, smoking, or family history of early coronary artery  disease.   The patient has only one other medical problem.  That is a history of  prostate cancer.   MEDICATIONS:  1.  Aspirin.  2.  Prevacid.  3.  Lotrel.  4.  Lovastatin.  5.  Celebrex.   ALLERGIES:  Unknown pain medicine.   PAST SURGICAL HISTORY:  Prostate surgery.   SOCIAL HISTORY:  The patient is a retired Consulting civil engineer.  He lives with  his wife.  He does not smoke cigarettes.   REVIEW OF SYSTEMS:  No problems related to his head, eyes, ears, nose,  mouth, throat, lungs, gastrointestinal system, genitourinary system, or  extremities.  There is no neurologic or psychiatric disorder.  There is  no  history of fever, chills, or weight loss.   PHYSICAL EXAMINATION:  VITAL SIGNS:  Blood pressure 143/85, pulse 114 and  regular, temperature 19.0, respirations 20.  GENERAL:  The patient was an older white man in no discomfort.  She was  alert, oriented, appropriate, and responsive.  HEENT:  Normal.  NECK:  Without thyromegaly or adenopathy.  Carotid pulses were palpable  bilaterally and without bruits.  CARDIOVASCULAR:  Normal S1 and S2.  There was no S3, S4, murmur, rub, or  click.  Cardiac rhythm is regular.  No chest wall tenderness was noted.  LUNGS:  Clear.  ABDOMEN:  Soft and nontender.  There was no mass, hepatosplenomegaly,  bruits, extension, rebound, guarding, or rigidity.  RECTAL:  GENITOURINARY:  Not performed as they were not pertinent to the  reason for acute care hospitalization.  EXTREMITIES:  Without edema, deviation, or deformity.  Radial and dorsalis  pedal pulses were palpable bilaterally.  NEUROLOGY:  Brief screening neurologic survey was unremarkable.   Electrocardiogram revealed sinus tachycardia.  There were  Q waves in leads 3  and aVF which were of uncertain significance, but the possibility of a prior  inferior myocardial infarction could not be excluded.  There were no changes  specific for ischemia or infarction.   The initial set of cardiac enzymes revealed a CK of 135, CK-MB 5.7, troponin  0.02.  Another set of cardiac enzymes revealed a total CK of 261, CK-MB  11.5, and troponin 0.07.  No other labs are available at this time.   IMPRESSION:  1.  Chest pain, rule out cardiac ischemia.  2.  Status post repair of incarcerated inguinal hernia today.  3.  Hypertension.  4.  Dyslipidemia.  5.  Status post prostate cancer.   PLAN:  1.  Telemetry.  2.  Serial cardiac enzymes.  3.  Aspirin.  4.  Intravenous nitroglycerin.  5.  Low dose Lovenox (cleared by Sharlet Salina T. Hoxworth, M.D.)  6.  Metoprolol.  7.  Further measures per Colleen Can. Deborah Chalk,  M.D.      Ulyses Amor, MD  Electronically Signed     MSC/MEDQ  D:  03/31/2005  T:  03/31/2005  Job:  [id number]   cc:   Colleen Can. Deborah Chalk, M.D.  Fax: [phone]

## 2010-12-13 NOTE — H&P (Signed)
NAMEJAYDEN, [name]            ACCOUNT NO.:  [id number]   MEDICAL RECORD NO.:  [id number]          PATIENT TYPE:  INP   LOCATION:  1826                         FACILITY:  [facility]   PHYSICIAN:  Carl [name], M.D.DATE OF BIRTH:  [birth year]   DATE OF ADMISSION:  03/30/2005  DATE OF DISCHARGE:                                HISTORY & PHYSICAL   CHIEF COMPLAINT:  Painful lump left groin.   HISTORY OF PRESENT ILLNESS:  Carl [name] is a 75 year old white male  who has had a known inguinal hernia on the left side for several years. He  has had a bulge that has come and gone occasionally, uncomfortable, but had  decided not to seek medical treatment. He now presents in the emergency room  with a several-hour history of a persistent painful mass in the left groin.  There is no nausea or vomiting. No urinary difficulties.   PAST MEDICAL HISTORY:  1.  History of cancer of the prostate status post seed implant.  2.  He is also treated for hypertension.  3.  Elevated cholesterol.  4.  Anxiety.   MEDICATIONS:  Aspirin, Prevacid, Lotrel, Lovastatin, Celebrex.   ALLERGIES:  UNKNOWN PAIN MEDICATION.   SOCIAL HISTORY:  Married. Denies cigarette or alcohol use.   FAMILY HISTORY:  Noncontributory.   REVIEW OF SYSTEMS:  GENERAL:  No fever, chills, weight change. RESPIRATORY:  No shortness of breath, cough, wheezing, history of lung disease. CARDIAC:  No chest pain, palpitations, swelling. No history of heart disease. ABDOMEN:  No abdominal pain, nausea, vomiting. GU:  No urinary burning or frequency.   PHYSICAL EXAMINATION:  VITAL SIGNS:  He is afebrile. Vital signs are within  normal limits.  GENERAL:  Mildly overweight white male in no acute distress.  SKIN:  Warm and dry, no rash or infection.  HEENT:  No palpable masses or thyromegaly. Sclerae nonicteric. Nares,  oropharynx clear.  LUNGS:  Clear to auscultation, no wheeze or increased work of breathing.  CARDIAC:  Regular  rate and rhythm, no murmurs, no edema.  ABDOMEN:  Nondistended, soft, nontender. There is an approximately 8 cm  tender palpable mass in the left inguinal canal consistent with incarcerated  hernia.  GU:  Normal genitalia.  EXTREMITIES:  No joint swelling or deformity.  NEUROLOGIC:  Alert and oriented, able to understand and answer questions,  grossly normal.   ASSESSMENT/PLAN:  Incarcerated left inguinal hernia. The patient is being  taken to the operating room for emergency repair under general anesthesia.      Lorne Skeens. [name], M.D.  Electronically Signed     BTH/MEDQ  D:  03/30/2005  T:  03/30/2005  Job:  [id number]

## 2010-12-13 NOTE — Op Note (Signed)
Carl [name], Carl [name]            ACCOUNT NO.:  [id number]   MEDICAL RECORD NO.:  [id number]          PATIENT TYPE:  INP   LOCATION:  1826                         FACILITY:  [facility]   PHYSICIAN:  Sharlet Salina T. Hoxworth, M.D.DATE OF BIRTH:  1935-09-23   DATE OF PROCEDURE:  03/30/2005  DATE OF DISCHARGE:                                 OPERATIVE REPORT   PREOPERATIVE DIAGNOSIS:  Incarcerated left inguinal hernia.   POSTOPERATIVE DIAGNOSIS:  Incarcerated left inguinal hernia.   SURGICAL PROCEDURE:  Repair of incarcerated left inguinal hernia with mesh.   SURGEON:  Lorne Skeens. Hoxworth, M.D.   ANESTHESIA:  General.   BRIEF HISTORY:  [name] is a 75 year old male with a known left  inguinal hernia for several years.  He now presents with a persistent  painful mass in the left groin of several hours' duration. Examination shows  a tender mass in left groin consistent with an incarcerated left inguinal  hernia. There is no evidence of bowel obstruction on history, exam, or  abdominal x-ray. I have recommended proceeding with emergency repair under  general anesthesia using mesh.  The nature of the procedure, indications,  risks of bleeding, infection, recurrence, and possible need for bowel  resection were discussed with the patient's family preoperatively.  He is  now brought to the operating room for this procedure.   DESCRIPTION OF OPERATION:  The patient was brought to the operating room,  placed in the supine position on the operating table, and general  endotracheal anesthesia was induced. He received preoperative IV  antibiotics. PAS were placed. The left groin was widely sterilely prepped  and draped with Ioban. Correct patient and procedure were verified. I made  an oblique incision in the left groin over the mass, and dissection was  carried down through  the subcutaneous tissue and Scarpa's fascia.  Dissection was deepened down to the external oblique which was cleared  down  to the external ring and inguinal ligament. There was a large mass at the  external ring. The external oblique was divided through the ring along the  lines of its fibers. The spermatic cord was then unable to be dissected up  off the floor of the pubic tubercle and encircled with a Penrose drain.  There was a good-sized hernia sac through the indirect space. As I dissected  this away from the cord, the contents spontaneously reduced. The indirect  sac was dissected away from cord structures up to and above the level of the  internal ring. I opened the sac at this point. There was just a little bit  of clear fluid. There was a small sliding component with appendix epiploica  from the sigmoid colon, and the sigmoid colon was adjacent, and I suspect  that the hernia had contained sigmoid colon and pericolonic fat. There was  no evidence of any visceral injury. The colon was dissected somewhat off the  sac in the area of the sliding component, and then the sac was suture  ligated with 2-0 silk and divided and removed. The internal ring was very  dilated. I closed this with several interrupted  2-0 Prolene sutures placed  between the internal oblique medially and the iliopubic tract and inguinal  ligament laterally, closing the internal ring down to a fingertip. A piece  of Parietex mesh was then chosen and trimmed to size to fit the floor of the  inguinal canal with tails around the cord at the inguinal ring.  The mesh  was sutured to the pubic tubercle and then to the iliopubic tract and  inguinal ligament with running 2-0 Prolene, working medial to lateral until  it was sutured up lateral to the internal ring. Medially, the mesh was  sutured to the edge of the rectus sheath and the internal oblique with  interrupted 2-0 Prolene. The tails were then tacked together lateral to cord  with interrupted 2-0 Prolene creating a new internal ring that was snug to a  fingertip. The area was  inspected fracture hemostasis which appeared  complete. The cord was returned to its anatomic position. The external  oblique was closed over this with running 3-0 Vicryl. Scarpa's fascia was  closed with running 3-0 Vicryl. The skin was closed with running  subcuticular 4-0 Monocryl and Steri-Strips. Sponge and instrument counts  were correct.  Dry sterile dressings were applied, and the patient was taken  to the recovery room in good condition.      Lorne Skeens. Hoxworth, M.D.  Electronically Signed     BTH/MEDQ  D:  03/30/2005  T:  03/30/2005  Job:  [id number]

## 2011-01-24 ENCOUNTER — Encounter: Payer: Self-pay | Admitting: *Deleted

## 2011-01-27 ENCOUNTER — Other Ambulatory Visit: Payer: Self-pay | Admitting: Cardiology

## 2011-01-27 MED ORDER — METOPROLOL TARTRATE 50 MG PO TABS: 50.0000 mg | ORAL_TABLET | Freq: Two times a day (BID) | ORAL | Status: DC

## 2011-01-27 NOTE — Telephone Encounter (Signed)
Med refill

## 2011-01-28 ENCOUNTER — Encounter: Payer: Self-pay | Admitting: Cardiology

## 2011-02-04 ENCOUNTER — Encounter: Payer: Self-pay | Admitting: Nurse Practitioner

## 2011-02-04 ENCOUNTER — Ambulatory Visit (INDEPENDENT_AMBULATORY_CARE_PROVIDER_SITE_OTHER): Payer: Medicare Other | Admitting: Nurse Practitioner

## 2011-02-04 VITALS — BP 120/74 | HR 60 | Ht 70.0 in | Wt 205.6 lb

## 2011-02-04 DIAGNOSIS — I2510 Atherosclerotic heart disease of native coronary artery without angina pectoris: Secondary | ICD-10-CM

## 2011-02-04 DIAGNOSIS — Z951 Presence of aortocoronary bypass graft: Secondary | ICD-10-CM | POA: Insufficient documentation

## 2011-02-04 DIAGNOSIS — E785 Hyperlipidemia, unspecified: Secondary | ICD-10-CM

## 2011-02-04 LAB — BASIC METABOLIC PANEL
BUN: 30 mg/dL — ABNORMAL HIGH (ref 6–23)
CO2: 25 mEq/L (ref 19–32)
Calcium: 9.2 mg/dL (ref 8.4–10.5)
Chloride: 111 mEq/L (ref 96–112)
Creatinine, Ser: 1.5 mg/dL (ref 0.4–1.5)
GFR: 47.27 mL/min — ABNORMAL LOW (ref >60.00)
Glucose, Bld: 109 mg/dL — ABNORMAL HIGH (ref 70–99)
Potassium: 4.6 mEq/L (ref 3.5–5.1)
Sodium: 142 mEq/L (ref 135–145)

## 2011-02-04 LAB — HEPATIC FUNCTION PANEL
ALT: 15 U/L (ref 0–53)
AST: 17 U/L (ref 0–37)
Albumin: 4.4 g/dL (ref 3.5–5.2)
Alkaline Phosphatase: 59 U/L (ref 39–117)
Bilirubin, Direct: 0 mg/dL (ref 0.0–0.3)
Total Bilirubin: 0.3 mg/dL (ref 0.3–1.2)
Total Protein: 7 g/dL (ref 6.0–8.3)

## 2011-02-04 LAB — LIPID PANEL
Cholesterol: 148 mg/dL (ref 0–200)
HDL: 39.9 mg/dL (ref >39.00)
LDL Cholesterol: 87 mg/dL (ref 0–99)
Total CHOL/HDL Ratio: 4
Triglycerides: 108 mg/dL (ref 0.0–149.0)
VLDL: 21.6 mg/dL (ref 0.0–40.0)

## 2011-02-04 NOTE — Assessment & Plan Note (Signed)
He is having trouble affording Zetia. Will see what his labs look like. No samples available today.

## 2011-02-04 NOTE — Assessment & Plan Note (Signed)
He has had a NSTEMI following hernia surgery with emergent CABG back in 2006. Nuclear study was performed in 2009. He remains asymptomatic. I have left him on his current medicines. We will see him back in 6 months. Labs are checked today although he was not entirely fasting, but he has a long car ride. Patient is agreeable to this plan and will call if any problems develop in the interim.

## 2011-02-04 NOTE — Progress Notes (Signed)
Gloriajean Dell Date of Birth: 05/05/1936   History of Present Illness: Mr. Signor is seen today for his 6 month visit. He is seen for Dr. Shirlee Latch. He is a former patient of Dr. Ronnald Nian. He is doing well. No chest pain. No shortness of breath. He does not exercise to any routine. He is having trouble affording his Zetia. He needs his labs today. He is tolerating his medicines otherwise. He thinks he is still taking his statin but will clarify when he returns home. His last nuclear was in 2009.   Current Outpatient Prescriptions on File Prior to Visit  Medication Sig Dispense Refill  . aspirin 325 MG tablet Take 325 mg by mouth daily.        [name] ezetimibe (ZETIA) 10 MG tablet Take 10 mg by mouth daily.        . fenofibrate 160 MG tablet Take 160 mg by mouth daily.        [name] lovastatin (MEVACOR) 40 MG tablet TAKE 1 TABLET EVERY DAY  30 tablet  3  . metoprolol (LOPRESSOR) 50 MG tablet Take 1 tablet (50 mg total) by mouth 2 (two) times daily.  60 tablet  1  . omeprazole (PRILOSEC) 20 MG capsule Take 20 mg by mouth daily.          Allergies  Allergen Reactions  . Other     Unknown pain medicine    Past Medical History  Diagnosis Date  . Hypertension   . Hyperlipidemia   . Ischemic heart disease   . Obesity   . History of prostate cancer     on Lupron  . GERD (gastroesophageal reflux disease)   . Chronic anxiety   . Coronary artery disease     04/04/2005 -- CABG x5  . Myocardial infarction 03/2005    postoperative MI - after emergency hernia repair  . S/P CABG (coronary artery bypass graft) 2006  . Normal nuclear stress test 2009    Past Surgical History  Procedure Date  . Cardiac catheterization 04/02/2005    Est. EF of 55% -- Critical three-vessel coronary disease --  Essentially normal left ventricular function -- Colleen Can. Deborah Chalk, M.D.  . Coronary artery bypass graft 04/04/2005    x5 (left internal mammary artery to distal left anterior descending coronary artery,  saphenous vein graft to ramus intermediate branch, saphenous vein graft to first circumflex marginal branch and sequential saphenous vein graft tosecond left posterolateral branch, saphenous vein graft to posterior descending coronary artery -- Salvatore Decent. Cornelius Moras, M.D.  . Hiatal hernia repair 03/2005    History  Smoking status  . Former Smoker  . Quit date: 07/28/1960  Smokeless tobacco  . Not on file    History  Alcohol Use No    Family History  Problem Relation Age of Onset  . Heart disease Neg Hx     Review of Systems: The review of systems is positive for mild joint pain. He has had a recent URI that has resolved.  All other systems were reviewed and are negative.  Physical Exam: BP 120/74  Pulse 60  Ht 5\' 10"  (1.778 m)  Wt 205 lb 9.6 oz (93.26 kg)  BMI 29.50 kg/m2 Patient is very pleasant and in no acute distress. Skin is warm and dry. Color is normal.  HEENT is unremarkable. Normocephalic/atraumatic. PERRL. Sclera are nonicteric. Neck is supple. No masses. No JVD. Lungs are clear. Cardiac exam shows a regular rate and rhythm. Abdomen is soft. Extremities  are without edema. Gait and ROM are intact. No gross neurologic deficits noted.  LABORATORY DATA:   Assessment / Plan:

## 2011-02-04 NOTE — Patient Instructions (Signed)
Make sure your medicine list is correct. We will check some labs today I will have you see Dr. Marca Ancona in 6 months with fasting labs Call for any problems.

## 2011-02-05 ENCOUNTER — Telehealth: Payer: Self-pay | Admitting: Cardiology

## 2011-02-05 NOTE — Telephone Encounter (Signed)
Message copied by Karle Plumber on Wed Feb 05, 2011  9:18 AM ------      Message from: Rosalio Macadamia      Created: Tue Feb 04, 2011  4:09 PM       Ok to report. Has some mild kidney impairment. Needs to avoid NSAIDS. Lipids are ok. If can't afford Zetia may stop.Stay on other medicines. Recheck BMET/HPF/LIPIDS  in 6 months.

## 2011-02-05 NOTE — Telephone Encounter (Signed)
Returning your call regarding his INR results. Please call back.

## 2011-03-03 ENCOUNTER — Telehealth: Payer: Self-pay | Admitting: Cardiology

## 2011-03-03 NOTE — Telephone Encounter (Signed)
Received call from patient wife stating that she received a bill from [facility] for 5.00.  This is due to our providers are being processed as out of network for [facility].  I had emailed this information to Daphene Calamity to be worked on.  Advised to patient it is being address.

## 2011-03-25 ENCOUNTER — Other Ambulatory Visit: Payer: Self-pay | Admitting: Cardiology

## 2011-04-23 ENCOUNTER — Other Ambulatory Visit: Payer: Self-pay | Admitting: *Deleted

## 2011-04-23 DIAGNOSIS — E781 Pure hyperglyceridemia: Secondary | ICD-10-CM

## 2011-04-23 NOTE — Telephone Encounter (Signed)
Refilled meds per fax request.  

## 2011-04-24 MED ORDER — FENOFIBRATE 160 MG PO TABS: 160.0000 mg | ORAL_TABLET | Freq: Every day | ORAL | Status: DC

## 2011-04-25 ENCOUNTER — Other Ambulatory Visit: Payer: Self-pay | Admitting: Nurse Practitioner

## 2011-07-20 ENCOUNTER — Other Ambulatory Visit: Payer: Self-pay | Admitting: Nurse Practitioner

## 2011-07-20 ENCOUNTER — Other Ambulatory Visit: Payer: Self-pay | Admitting: Cardiology

## 2011-08-05 ENCOUNTER — Ambulatory Visit (INDEPENDENT_AMBULATORY_CARE_PROVIDER_SITE_OTHER): Payer: Medicare Other | Admitting: Cardiology

## 2011-08-05 ENCOUNTER — Encounter: Payer: Self-pay | Admitting: Cardiology

## 2011-08-05 DIAGNOSIS — I10 Essential (primary) hypertension: Secondary | ICD-10-CM

## 2011-08-05 DIAGNOSIS — I25810 Atherosclerosis of coronary artery bypass graft(s) without angina pectoris: Secondary | ICD-10-CM

## 2011-08-05 DIAGNOSIS — R011 Cardiac murmur, unspecified: Secondary | ICD-10-CM | POA: Insufficient documentation

## 2011-08-05 DIAGNOSIS — I2510 Atherosclerotic heart disease of native coronary artery without angina pectoris: Secondary | ICD-10-CM

## 2011-08-05 DIAGNOSIS — E785 Hyperlipidemia, unspecified: Secondary | ICD-10-CM

## 2011-08-05 LAB — BASIC METABOLIC PANEL
BUN: 20 mg/dL (ref 6–23)
CO2: 26 mEq/L (ref 19–32)
Calcium: 9.3 mg/dL (ref 8.4–10.5)
Chloride: 110 mEq/L (ref 96–112)
Creatinine, Ser: 1.5 mg/dL (ref 0.4–1.5)
GFR: 49.03 mL/min — ABNORMAL LOW (ref >60.00)
Glucose, Bld: 101 mg/dL — ABNORMAL HIGH (ref 70–99)
Potassium: 4.9 mEq/L (ref 3.5–5.1)
Sodium: 142 mEq/L (ref 135–145)

## 2011-08-05 LAB — LIPID PANEL
Cholesterol: 140 mg/dL (ref 0–200)
HDL: 30.1 mg/dL — ABNORMAL LOW (ref >39.00)
LDL Cholesterol: 95 mg/dL (ref 0–99)
Total CHOL/HDL Ratio: 5
Triglycerides: 76 mg/dL (ref 0.0–149.0)
VLDL: 15.2 mg/dL (ref 0.0–40.0)

## 2011-08-05 NOTE — Assessment & Plan Note (Signed)
Stable s/p CABG.  Had normal myoview in 2009.  No ischemic symptoms.  Continue ASA, statin, metoprolol.  Will hold off on ACEI for right now with elevated creatinine when last checked.  Will repeat BMET today to see what creatinine looks like.

## 2011-08-05 NOTE — Assessment & Plan Note (Signed)
Goal LDL < 70.  He is no longer taking Zetia, just lovastatin.  I will check lipids today.

## 2011-08-05 NOTE — Assessment & Plan Note (Signed)
Mitral area murmur.  Will get echo.

## 2011-08-05 NOTE — Assessment & Plan Note (Signed)
BP high today but checks at home and tends to run in normal range.  I will have him check his BP periodically over the next 2 weeks and we will call to see what it runs at home.  If BP is elevated at home, would start additional agent.  Would ideally choose ACEI but need to recheck creatinine first (will get BMET today).

## 2011-08-05 NOTE — Progress Notes (Signed)
PCP: Dr. Lacie Scotts  76 yo with history of CAD s/p CABG presents for cardiology followup.  He has seen Dr. Deborah Chalk in the past and is seeing me for the first time today.  In 9/06, he had NSTEMI after hernia repair.  Cath showed severe 3 vessel disease so CABG was done.  Last myoview in 2009 showed no evidence for ischemia or infarction.    Carl [name] has been doing well lately.  No chest pain or exertional dyspnea.  No formal exercise program but he is fairly active.  BP is running high in the office today but has been in the 130s/80s range when he checks at home.  He is no longer taking Zetia but is still on lovastatin.    ECG: NSR, 1st degree AV block (216 msec), old ASMI  Labs (7/12): LDL 87, HDL 40, K 4.6, creatinine 1.5  PMH: 1. CKD 2. HTN 3. Hyperlipidemia 4. H/o prostate CA treated in 1997 with radioactive seeds, now getting Lupron injections.  5. GERD 6. CAD s/p CABG x 5 in 9/06.  Patient had NSTEMI post-op hernia repair and was noted on cath to have severe 3VD with preserved EF (55%).  CABG was done with LIMA-LAD, SVG-ramus, SVG-OM, and seq SVG-PLV and PDA.  Normal myoview in 2009.  7. Hiatal hernia repair.   SH: Quit smoking in 1962.  Lives in [place] with wife.  3 children, one lives with them.  Retired from [employer].    FH: No premature CAD  ROS: All systems reviewed and negative except as per HPI.   Current Outpatient Prescriptions  Medication Sig Dispense Refill  . aspirin 325 MG tablet Take 325 mg by mouth daily.        . fenofibrate 160 MG tablet Take 1 tablet (160 mg total) by mouth daily.  30 tablet  5  . HYDROcodone-acetaminophen (VICODIN) 5-500 MG per tablet Take 1 tablet by mouth every 8 (eight) hours as needed.       . lovastatin (MEVACOR) 40 MG tablet TAKE 1 TABLET EVERY DAY  30 tablet  5  . metoprolol (LOPRESSOR) 50 MG tablet TAKE 1 TABLET BY MOUTH TWICE A DAY  60 tablet  2  . omeprazole (PRILOSEC) 20 MG capsule TAKE 1 CAPSULE BY MOUTH DAILY  (REPLACES PREVACID)  30 capsule  6    BP 158/88  Pulse 62  Ht 5\' 11"  (1.803 m)  Wt 98.884 kg (218 lb)  BMI 30.40 kg/m2 General: NAD Neck: No JVD, no thyromegaly or thyroid nodule.  Lungs: Clear to auscultation bilaterally with normal respiratory effort. CV: Nondisplaced PMI.  Heart regular S1/S2, no S3/S4, 2/6 early SEM at apex.  No peripheral edema.  No carotid bruit.  Normal pedal pulses.  Abdomen: Soft, nontender, no hepatosplenomegaly, no distention.  Neurologic: Alert and oriented x 3.  Psych: Normal affect. Extremities: No clubbing or cyanosis.

## 2011-08-05 NOTE — Patient Instructions (Signed)
Your physician recommends that you have  a FASTING lipid profile/ BMET today--401.9 785.2  414.05 272.0  Your physician has requested that you have an echocardiogram. Echocardiography is a painless test that uses sound waves to create images of your heart. It provides your doctor with information about the size and shape of your heart and how well your heart's chambers and valves are working. This procedure takes approximately one hour. There are no restrictions for this procedure.  Take and record your blood pressure daily. I will call you in 2 weeks to get the readings. Luana Shu [phone]  Your physician wants you to follow-up in: 6 months with Dr Shirlee Latch. (July 2013) You will receive a reminder letter in the mail two months in advance. If you don't receive a letter, please call our office to schedule the follow-up appointment.

## 2011-08-08 ENCOUNTER — Telehealth: Payer: Self-pay | Admitting: *Deleted

## 2011-08-08 DIAGNOSIS — E785 Hyperlipidemia, unspecified: Secondary | ICD-10-CM

## 2011-08-08 DIAGNOSIS — I25810 Atherosclerosis of coronary artery bypass graft(s) without angina pectoris: Secondary | ICD-10-CM

## 2011-08-08 MED ORDER — ATORVASTATIN CALCIUM 20 MG PO TABS: 20.0000 mg | ORAL_TABLET | Freq: Every day | ORAL | Status: DC

## 2011-08-08 NOTE — Telephone Encounter (Signed)
Notes Recorded by Jacqlyn Krauss, RN on 08/08/2011 at 5:05 PM Discussed with pt and wife. Pt will stop lovastatin and start atorvastatin 20mg  daily. Pt will return for fasting lipid /liver profile 10/09/11. ------  Notes Recorded by Marca Ancona, MD on 08/05/2011 at 4:53 PM LDL is too high. Would stop lovastatin, start atorvastatin 20 mg for goal LDL < 70. Creatinine stable.

## 2011-08-14 ENCOUNTER — Ambulatory Visit (HOSPITAL_COMMUNITY): Payer: Medicare Other | Attending: Cardiology | Admitting: Radiology

## 2011-08-14 DIAGNOSIS — E785 Hyperlipidemia, unspecified: Secondary | ICD-10-CM | POA: Insufficient documentation

## 2011-08-14 DIAGNOSIS — I252 Old myocardial infarction: Secondary | ICD-10-CM | POA: Insufficient documentation

## 2011-08-14 DIAGNOSIS — N189 Chronic kidney disease, unspecified: Secondary | ICD-10-CM | POA: Insufficient documentation

## 2011-08-14 DIAGNOSIS — R011 Cardiac murmur, unspecified: Secondary | ICD-10-CM

## 2011-08-14 DIAGNOSIS — I2510 Atherosclerotic heart disease of native coronary artery without angina pectoris: Secondary | ICD-10-CM | POA: Insufficient documentation

## 2011-08-14 DIAGNOSIS — Z951 Presence of aortocoronary bypass graft: Secondary | ICD-10-CM | POA: Insufficient documentation

## 2011-08-14 DIAGNOSIS — I129 Hypertensive chronic kidney disease with stage 1 through stage 4 chronic kidney disease, or unspecified chronic kidney disease: Secondary | ICD-10-CM | POA: Insufficient documentation

## 2011-08-15 ENCOUNTER — Other Ambulatory Visit: Payer: Self-pay | Admitting: *Deleted

## 2011-08-15 ENCOUNTER — Telehealth: Payer: Self-pay | Admitting: *Deleted

## 2011-08-15 DIAGNOSIS — E875 Hyperkalemia: Secondary | ICD-10-CM

## 2011-08-15 NOTE — Telephone Encounter (Signed)
08/15/11--1430p--spoke with pt and gave results of echo and advised pt he needs to follow low K+ diet(sent diet instructions to home), start lisinopril 2.5 mg qd (called in script to [facility]), and f/u in 2 weeks with repeat BMET--pt agrees to all and will come for lab on 08/29/11--nt

## 2011-08-19 ENCOUNTER — Telehealth: Payer: Self-pay | Admitting: *Deleted

## 2011-08-19 NOTE — Telephone Encounter (Signed)
Pt aware.

## 2011-08-19 NOTE — Telephone Encounter (Signed)
Good BP

## 2011-08-19 NOTE — Telephone Encounter (Signed)
Hypertension - Carl Ancona, MD 08/05/2011 11:55 AM Signed  BP high today but checks at home and tends to run in normal range. I will have him check his BP periodically over the next 2 weeks and we will call to see what it runs at home. If BP is elevated at home, would start additional agent. Would ideally choose ACEI but need to recheck creatinine first (will get BMET today).   08/19/11--talked with pt. Pt states since OV with Dr Shirlee Latch BPs have been in the 132-134/62-65 range. Pt did start lisinopril 2.5mg  daily 08/16/11 based on echo results 08/14/11. He states he did get one reading 115 systolic since then. Pt scheduled for repeat BMET 08/29/11.  I will forward to Dr Shirlee Latch for review.

## 2011-08-29 ENCOUNTER — Other Ambulatory Visit (INDEPENDENT_AMBULATORY_CARE_PROVIDER_SITE_OTHER): Payer: Medicare Other | Admitting: *Deleted

## 2011-08-29 DIAGNOSIS — E875 Hyperkalemia: Secondary | ICD-10-CM

## 2011-08-29 LAB — BASIC METABOLIC PANEL
BUN: 27 mg/dL — ABNORMAL HIGH (ref 6–23)
CO2: 22 mEq/L (ref 19–32)
Calcium: 9.2 mg/dL (ref 8.4–10.5)
Chloride: 109 mEq/L (ref 96–112)
Creatinine, Ser: 1.5 mg/dL (ref 0.4–1.5)
GFR: 47.55 mL/min — ABNORMAL LOW (ref >60.00)
Glucose, Bld: 96 mg/dL (ref 70–99)
Potassium: 4.5 mEq/L (ref 3.5–5.1)
Sodium: 139 mEq/L (ref 135–145)

## 2011-09-05 ENCOUNTER — Telehealth: Payer: Self-pay | Admitting: Cardiology

## 2011-09-05 NOTE — Telephone Encounter (Signed)
Patient and aware of lab results.

## 2011-09-05 NOTE — Telephone Encounter (Signed)
New Problem   Patient wife Bonita Quin calling for the results of patient labwork 08/29/11. Please return call patient at hm#

## 2011-10-09 ENCOUNTER — Other Ambulatory Visit: Payer: Self-pay | Admitting: Cardiology

## 2011-10-09 ENCOUNTER — Other Ambulatory Visit (INDEPENDENT_AMBULATORY_CARE_PROVIDER_SITE_OTHER): Payer: Medicare Other

## 2011-10-09 DIAGNOSIS — E785 Hyperlipidemia, unspecified: Secondary | ICD-10-CM

## 2011-10-09 DIAGNOSIS — I25810 Atherosclerosis of coronary artery bypass graft(s) without angina pectoris: Secondary | ICD-10-CM

## 2011-10-09 LAB — HEPATIC FUNCTION PANEL
ALT: 14 U/L (ref 0–53)
AST: 19 U/L (ref 0–37)
Albumin: 3.9 g/dL (ref 3.5–5.2)
Alkaline Phosphatase: 50 U/L (ref 39–117)
Bilirubin, Direct: 0.1 mg/dL (ref 0.0–0.3)
Total Bilirubin: 0.4 mg/dL (ref 0.3–1.2)
Total Protein: 6.7 g/dL (ref 6.0–8.3)

## 2011-10-09 LAB — BASIC METABOLIC PANEL
BUN: 27 mg/dL — ABNORMAL HIGH (ref 6–23)
CO2: 25 mEq/L (ref 19–32)
Calcium: 9.2 mg/dL (ref 8.4–10.5)
Chloride: 107 mEq/L (ref 96–112)
Creatinine, Ser: 1.4 mg/dL (ref 0.4–1.5)
GFR: 52.66 mL/min — ABNORMAL LOW (ref >60.00)
Glucose, Bld: 102 mg/dL — ABNORMAL HIGH (ref 70–99)
Potassium: 4.3 mEq/L (ref 3.5–5.1)
Sodium: 139 mEq/L (ref 135–145)

## 2011-10-09 LAB — LIPID PANEL
Cholesterol: 124 mg/dL (ref 0–200)
HDL: 35 mg/dL — ABNORMAL LOW (ref >39.00)
LDL Cholesterol: 74 mg/dL (ref 0–99)
Total CHOL/HDL Ratio: 4
Triglycerides: 75 mg/dL (ref 0.0–149.0)
VLDL: 15 mg/dL (ref 0.0–40.0)

## 2011-10-21 ENCOUNTER — Other Ambulatory Visit: Payer: Self-pay | Admitting: *Deleted

## 2011-10-21 DIAGNOSIS — E781 Pure hyperglyceridemia: Secondary | ICD-10-CM

## 2011-10-21 MED ORDER — METOPROLOL TARTRATE 50 MG PO TABS: 50.0000 mg | ORAL_TABLET | Freq: Two times a day (BID) | ORAL | Status: DC

## 2011-10-21 MED ORDER — FENOFIBRATE 160 MG PO TABS: 160.0000 mg | ORAL_TABLET | Freq: Every day | ORAL | Status: DC

## 2012-01-16 ENCOUNTER — Other Ambulatory Visit: Payer: Self-pay | Admitting: Cardiology

## 2012-01-28 ENCOUNTER — Encounter: Payer: Self-pay | Admitting: Cardiology

## 2012-01-28 ENCOUNTER — Ambulatory Visit (INDEPENDENT_AMBULATORY_CARE_PROVIDER_SITE_OTHER): Payer: Medicare Other | Admitting: Cardiology

## 2012-01-28 VITALS — BP 122/70 | HR 80 | Ht 71.0 in | Wt 209.0 lb

## 2012-01-28 DIAGNOSIS — I2510 Atherosclerotic heart disease of native coronary artery without angina pectoris: Secondary | ICD-10-CM

## 2012-01-28 DIAGNOSIS — I10 Essential (primary) hypertension: Secondary | ICD-10-CM

## 2012-01-28 DIAGNOSIS — E785 Hyperlipidemia, unspecified: Secondary | ICD-10-CM

## 2012-01-28 MED ORDER — LISINOPRIL 5 MG PO TABS: 5.0000 mg | ORAL_TABLET | Freq: Every day | ORAL | Status: DC

## 2012-01-28 MED ORDER — ASPIRIN EC 81 MG PO TBEC: 81.0000 mg | DELAYED_RELEASE_TABLET | Freq: Every day | ORAL | Status: DC

## 2012-01-28 NOTE — Patient Instructions (Addendum)
Increase Lisinopril 5 mg daily  Start Aspirin 81 mg daily  Your physician recommends that you return for lab work Bmet in: 3 weeks 02/18/12 you may eat( 8:30 am to 12 noon.)   Your physician wants you to follow-up in: 6 months You will receive a reminder letter in the mail two months in advance. If you don't receive a letter, please call our office to schedule the follow-up appointment.

## 2012-01-30 NOTE — Assessment & Plan Note (Signed)
Stable s/p CABG.  Had normal myoview in 2009.  No ischemic symptoms.  Echo with EF 45-50% septal akinesis (septal Qs on ECG).  Continue ASA, statin, metoprolol.  Can decrease ASA to 81 mg daily.  On ACEI for secondary prevention and with mild cardiomyopathy, can increase lisinopril to 5 mg daily today. BMET in 3 wks.

## 2012-01-30 NOTE — Assessment & Plan Note (Signed)
BP is controlled 

## 2012-01-30 NOTE — Progress Notes (Signed)
Patient ID: Carl [name], male   DOB: 1935-12-09, 76 y.o.   MRN: [id number] PCP: Dr. Lacie Scotts  76 y.o. with history of CAD s/p CABG presents for cardiology followup.  In 9/06, he had NSTEMI after hernia repair.  Cath showed severe 3 vessel disease so CABG was done.  Last myoview in 2009 showed no evidence for ischemia or infarction.  Echo earlier this year showed EF 45-50%.   Carl [name] has been doing well lately.  No chest pain or exertional dyspnea.  No formal exercise program but he is fairly active and has planted a garden.  BP is well-controlled.   Labs (7/12): LDL 87, HDL 40, K 4.6, creatinine 1.5 Labs (3/13): K 4.3, creatinine 1.4, LDL 74, HDL 35  PMH: 1. CKD 2. HTN 3. Hyperlipidemia 4. H/o prostate CA treated in 1997 with radioactive seeds, now getting Lupron injections.  5. GERD 6. CAD s/p CABG x 5 in 9/06.  Patient had NSTEMI post-op hernia repair and was noted on cath to have severe 3VD with preserved EF (55%).  CABG was done with LIMA-LAD, SVG-ramus, SVG-OM, and seq SVG-PLV and PDA.  Normal myoview in 2009.  7. Hiatal hernia repair.  8. Ischemic cardiomyopathy: Echo (1/13) with EF 45-50%, septal akinesis, mild MR.   SH: Quit smoking in 1962.  Lives in [place] with wife.  3 children, one lives with them.  Retired from [employer].    FH: No premature CAD  ROS: All systems reviewed and negative except as per HPI.   Current Outpatient Prescriptions  Medication Sig Dispense Refill  . aspirin 325 MG tablet Take 325 mg by mouth daily.        [name] atorvastatin (LIPITOR) 20 MG tablet Take 1 tablet (20 mg total) by mouth daily.  30 tablet  6  . fenofibrate 160 MG tablet Take 1 tablet (160 mg total) by mouth daily.  30 tablet  5  . HYDROcodone-acetaminophen (VICODIN) 5-500 MG per tablet Take 1 tablet by mouth every 8 (eight) hours as needed.       . metoprolol (LOPRESSOR) 50 MG tablet Take 1 tablet (50 mg total) by mouth 2 (two) times daily.  60 tablet  6  . omeprazole  (PRILOSEC) 20 MG capsule TAKE 1 CAPSULE BY MOUTH DAILY (REPLACES PREVACID)  30 capsule  6  . aspirin EC 81 MG tablet Take 1 tablet (81 mg total) by mouth daily.  30 tablet  6  . lisinopril (PRINIVIL,ZESTRIL) 5 MG tablet Take 1 tablet (5 mg total) by mouth daily.  30 tablet  6    BP 122/70  Pulse 80  Ht 5\' 11"  (1.803 m)  Wt 94.802 kg (209 lb)  BMI 29.15 kg/m2 General: NAD Neck: No JVD, no thyromegaly or thyroid nodule.  Lungs: Clear to auscultation bilaterally with normal respiratory effort. CV: Nondisplaced PMI.  Heart regular S1/S2, no S3/S4, 2/6 early SEM at apex.  No peripheral edema.  No carotid bruit.  Normal pedal pulses.  Abdomen: Soft, nontender, no hepatosplenomegaly, no distention.  Neurologic: Alert and oriented x 3.  Psych: Normal affect. Extremities: No clubbing or cyanosis.

## 2012-01-30 NOTE — Assessment & Plan Note (Signed)
Lipids near goal (LDL < 70) when last checked in 3/13.

## 2012-02-16 ENCOUNTER — Other Ambulatory Visit: Payer: Self-pay | Admitting: Nurse Practitioner

## 2012-02-18 ENCOUNTER — Other Ambulatory Visit (INDEPENDENT_AMBULATORY_CARE_PROVIDER_SITE_OTHER): Payer: Medicare Other

## 2012-02-18 DIAGNOSIS — I2510 Atherosclerotic heart disease of native coronary artery without angina pectoris: Secondary | ICD-10-CM

## 2012-02-18 LAB — BASIC METABOLIC PANEL
BUN: 27 mg/dL — ABNORMAL HIGH (ref 6–23)
CO2: 24 mEq/L (ref 19–32)
Calcium: 9.3 mg/dL (ref 8.4–10.5)
Chloride: 107 mEq/L (ref 96–112)
Creatinine, Ser: 1.4 mg/dL (ref 0.4–1.5)
GFR: 54.4 mL/min — ABNORMAL LOW (ref >60.00)
Glucose, Bld: 108 mg/dL — ABNORMAL HIGH (ref 70–99)
Potassium: 4.3 mEq/L (ref 3.5–5.1)
Sodium: 139 mEq/L (ref 135–145)

## 2012-03-16 ENCOUNTER — Other Ambulatory Visit: Payer: Self-pay | Admitting: Cardiology

## 2012-04-27 ENCOUNTER — Other Ambulatory Visit: Payer: Self-pay | Admitting: Cardiology

## 2012-06-02 ENCOUNTER — Other Ambulatory Visit: Payer: Self-pay | Admitting: Cardiology

## 2012-07-16 ENCOUNTER — Encounter: Payer: Self-pay | Admitting: Cardiology

## 2012-07-16 ENCOUNTER — Ambulatory Visit (INDEPENDENT_AMBULATORY_CARE_PROVIDER_SITE_OTHER): Payer: Medicare Other | Admitting: Cardiology

## 2012-07-16 VITALS — BP 160/72 | HR 66 | Ht 71.0 in | Wt 203.2 lb

## 2012-07-16 DIAGNOSIS — I2510 Atherosclerotic heart disease of native coronary artery without angina pectoris: Secondary | ICD-10-CM

## 2012-07-16 DIAGNOSIS — I10 Essential (primary) hypertension: Secondary | ICD-10-CM

## 2012-07-16 DIAGNOSIS — I2589 Other forms of chronic ischemic heart disease: Secondary | ICD-10-CM

## 2012-07-16 DIAGNOSIS — N183 Chronic kidney disease, stage 3 unspecified: Secondary | ICD-10-CM | POA: Insufficient documentation

## 2012-07-16 DIAGNOSIS — N189 Chronic kidney disease, unspecified: Secondary | ICD-10-CM

## 2012-07-16 DIAGNOSIS — I255 Ischemic cardiomyopathy: Secondary | ICD-10-CM

## 2012-07-16 DIAGNOSIS — E785 Hyperlipidemia, unspecified: Secondary | ICD-10-CM

## 2012-07-16 LAB — LIPID PANEL
Cholesterol: 116 mg/dL (ref 0–200)
HDL: 26.8 mg/dL — ABNORMAL LOW (ref >39.00)
LDL Cholesterol: 73 mg/dL (ref 0–99)
Total CHOL/HDL Ratio: 4
Triglycerides: 82 mg/dL (ref 0.0–149.0)
VLDL: 16.4 mg/dL (ref 0.0–40.0)

## 2012-07-16 LAB — BASIC METABOLIC PANEL
BUN: 17 mg/dL (ref 6–23)
CO2: 26 mEq/L (ref 19–32)
Calcium: 8.9 mg/dL (ref 8.4–10.5)
Chloride: 109 mEq/L (ref 96–112)
Creatinine, Ser: 1.4 mg/dL (ref 0.4–1.5)
GFR: 54.8 mL/min — ABNORMAL LOW (ref >60.00)
Glucose, Bld: 107 mg/dL — ABNORMAL HIGH (ref 70–99)
Potassium: 4.6 mEq/L (ref 3.5–5.1)
Sodium: 140 mEq/L (ref 135–145)

## 2012-07-16 MED ORDER — CARVEDILOL 12.5 MG PO TABS: 12.5000 mg | ORAL_TABLET | Freq: Two times a day (BID) | ORAL | Status: DC

## 2012-07-16 NOTE — Patient Instructions (Addendum)
Stop metoprolol.   Start coreg(carvedilol) 12.5mg  two times a day.  Your physician recommends that you have lipid profile /BMET today.  Your physician wants you to follow-up in: 6 months with Dr Shirlee Latch. (June 2014).You will receive a reminder letter in the mail two months in advance. If you don't receive a letter, please call our office to schedule the follow-up appointment.

## 2012-07-16 NOTE — Progress Notes (Signed)
Patient ID: Carl [name], male   DOB: [birth year], 76 y.o.   MRN: [id number] PCP: Dr. Lacie Scotts  76 yo with history of CAD s/p CABG presents for cardiology followup.  In 9/06, he had NSTEMI after hernia repair.  Cath showed severe 3 vessel disease so CABG was done.  Last myoview in 2009 showed no evidence for ischemia or infarction.  Echo earlier this year showed EF 45-50%.   Carl [name] has been doing well lately.  No chest pain or exertional dyspnea.  No formal exercise program but he is fairly active and does all his yardwork on [place] acres.  He has been doing a lot of raking and leaf-blowing.  He does some woodworking as well.  His wife of 55 yrs recently passed away from COPD complications.  He is coping with it fairly well.  Weight is down 6 lbs since last appointment.  BP is running high today, tends to be in the 130s systolic when he checks at home.   Labs (7/12): LDL 87, HDL 40, K 4.6, creatinine 1.5 Labs (3/13): K 4.3, creatinine 1.4, LDL 74, HDL 35  PMH: 1. CKD 2. HTN 3. Hyperlipidemia 4. H/o prostate CA treated in 1997 with radioactive seeds, now getting Lupron injections.  5. GERD 6. CAD s/p CABG x 5 in 9/06.  Patient had NSTEMI post-op hernia repair and was noted on cath to have severe 3VD with preserved EF (55%).  CABG was done with LIMA-LAD, SVG-ramus, SVG-OM, and seq SVG-PLV and PDA.  Normal myoview in 2009.  7. Hiatal hernia repair.  8. Ischemic cardiomyopathy: Echo (1/13) with EF 45-50%, septal akinesis, mild MR.   SH: Quit smoking in 1962.  Lives in [place] with wife.  3 children, one lives with them.  Retired from [employer].    FH: No premature CAD  ROS: All systems reviewed and negative except as per HPI.   Current Outpatient Prescriptions  Medication Sig Dispense Refill  . aspirin EC 81 MG tablet Take 1 tablet (81 mg total) by mouth daily.  30 tablet  6  . atorvastatin (LIPITOR) 20 MG tablet TAKE 1 TABLET BY MOUTH EVERY DAY  30 tablet  6  . fenofibrate  160 MG tablet TAKE 1 TABLET EVERY DAY  30 tablet  5  . HYDROcodone-acetaminophen (VICODIN) 5-500 MG per tablet Take 1 tablet by mouth every 8 (eight) hours as needed.       [name] lisinopril (PRINIVIL,ZESTRIL) 5 MG tablet Take 1 tablet (5 mg total) by mouth daily.  30 tablet  6  . metoprolol (LOPRESSOR) 50 MG tablet TAKE 1 TABLET TWICE A DAY  60 tablet  5  . omeprazole (PRILOSEC) 20 MG capsule TAKE ONE CAPSULE BY MOUTH EVERY DAY *REPLACES PREVACID*  30 capsule  6    BP 160/72  Pulse 66  Ht 5\' 11"  (1.803 m)  Wt 203 lb 3.2 oz (92.171 kg)  BMI 28.34 kg/m2  SpO2 98% General: NAD Neck: No JVD, no thyromegaly or thyroid nodule.  Lungs: Clear to auscultation bilaterally with normal respiratory effort. CV: Nondisplaced PMI.  Heart regular S1/S2, no S3/S4, 1/6 early SEM at apex.  No peripheral edema.  No carotid bruit.  Normal pedal pulses.  Abdomen: Soft, nontender, no hepatosplenomegaly, no distention.  Neurologic: Alert and oriented x 3.  Psych: Normal affect. Extremities: No clubbing or cyanosis.   Assessment/Plan:  CAD  Stable s/p CABG. Had normal myoview in 2009. No ischemic symptoms. Echo with EF 45-50% septal akinesis (septal Qs  on ECG).  Continue ASA 81, statin, ACEI, beta blocker.  Hyperlipidemia   Check lipids today, continue statin.  Hypertension  BP is high today.  I am going to stop metoprolol and put him on Coreg 12.5 mg bid which should lower the BP to a greater extent.  Ischemic Cardiomyopathy EF around 45%.  NYHA class I-II symptoms.  Not volume overloaded on exam.  Continue lisinopril.  Will change metoprolol to Coreg 12.5 mg bid as above.  CKD BMET today.   He is on lisinopril.   74 10:07 AM

## 2012-08-10 ENCOUNTER — Telehealth: Payer: Self-pay | Admitting: Cardiology

## 2012-08-10 NOTE — Telephone Encounter (Signed)
Pt has had some medication changes and b/p is now elevated and they need to be advised as to what to do

## 2012-08-10 NOTE — Telephone Encounter (Signed)
07/16/12 metoprolol stopped and coreg 12.5mg  two times a day started. Daughter states pt's BP has been varying. Recent BP readings 132/74 08/10/12 154/82 08/09/12. Pt's daughter will continue to monitor BP and heart rate daily for 10 days. She will call readings in 10 days.

## 2012-08-10 NOTE — Telephone Encounter (Signed)
Spoke with pt's daughter,[name].

## 2012-09-13 ENCOUNTER — Other Ambulatory Visit: Payer: Self-pay | Admitting: Nurse Practitioner

## 2012-09-15 ENCOUNTER — Other Ambulatory Visit: Payer: Self-pay | Admitting: Cardiology

## 2012-10-11 ENCOUNTER — Other Ambulatory Visit: Payer: Self-pay | Admitting: Cardiology

## 2012-10-12 ENCOUNTER — Other Ambulatory Visit: Payer: Self-pay | Admitting: Cardiology

## 2012-10-12 ENCOUNTER — Other Ambulatory Visit: Payer: Self-pay | Admitting: *Deleted

## 2012-10-12 MED ORDER — ATORVASTATIN CALCIUM 20 MG PO TABS: 20.0000 mg | ORAL_TABLET | Freq: Every day | ORAL | Status: DC

## 2012-11-07 ENCOUNTER — Other Ambulatory Visit: Payer: Self-pay | Admitting: Cardiovascular Disease

## 2012-11-09 ENCOUNTER — Other Ambulatory Visit: Payer: Self-pay | Admitting: Cardiology

## 2012-11-09 ENCOUNTER — Other Ambulatory Visit: Payer: Self-pay | Admitting: Cardiovascular Disease

## 2012-11-09 ENCOUNTER — Other Ambulatory Visit: Payer: Self-pay | Admitting: Nurse Practitioner

## 2012-12-30 ENCOUNTER — Ambulatory Visit (INDEPENDENT_AMBULATORY_CARE_PROVIDER_SITE_OTHER): Payer: Medicare Other | Admitting: Cardiology

## 2012-12-30 ENCOUNTER — Encounter: Payer: Self-pay | Admitting: Cardiology

## 2012-12-30 VITALS — BP 116/72 | HR 88 | Ht 72.0 in | Wt 180.8 lb

## 2012-12-30 DIAGNOSIS — I10 Essential (primary) hypertension: Secondary | ICD-10-CM

## 2012-12-30 DIAGNOSIS — I2589 Other forms of chronic ischemic heart disease: Secondary | ICD-10-CM

## 2012-12-30 DIAGNOSIS — E785 Hyperlipidemia, unspecified: Secondary | ICD-10-CM

## 2012-12-30 DIAGNOSIS — I255 Ischemic cardiomyopathy: Secondary | ICD-10-CM

## 2012-12-30 DIAGNOSIS — R011 Cardiac murmur, unspecified: Secondary | ICD-10-CM

## 2012-12-30 DIAGNOSIS — I2510 Atherosclerotic heart disease of native coronary artery without angina pectoris: Secondary | ICD-10-CM

## 2012-12-30 LAB — BASIC METABOLIC PANEL
BUN: 26 mg/dL — ABNORMAL HIGH (ref 6–23)
CO2: 24 mEq/L (ref 19–32)
Calcium: 9.7 mg/dL (ref 8.4–10.5)
Chloride: 109 mEq/L (ref 96–112)
Creatinine, Ser: 1.4 mg/dL (ref 0.4–1.5)
GFR: 50.81 mL/min — ABNORMAL LOW (ref >60.00)
Glucose, Bld: 104 mg/dL — ABNORMAL HIGH (ref 70–99)
Potassium: 4.3 mEq/L (ref 3.5–5.1)
Sodium: 142 mEq/L (ref 135–145)

## 2012-12-30 LAB — LIPID PANEL
Cholesterol: 133 mg/dL (ref 0–200)
HDL: 28.9 mg/dL — ABNORMAL LOW (ref >39.00)
LDL Cholesterol: 83 mg/dL (ref 0–99)
Total CHOL/HDL Ratio: 5
Triglycerides: 108 mg/dL (ref 0.0–149.0)
VLDL: 21.6 mg/dL (ref 0.0–40.0)

## 2012-12-30 NOTE — Patient Instructions (Addendum)
Your physician recommends that you have  lab work today--lipid profile/BMET.   Your physician wants you to follow-up in: 1 year with Dr Shirlee Latch. (June 2015).  You will receive a reminder letter in the mail two months in advance. If you don't receive a letter, please call our office to schedule the follow-up appointment.

## 2012-12-31 NOTE — Progress Notes (Signed)
Patient ID: Carl [name], male   DOB: 1936/01/09, 77 y.o.   MRN: [id number] PCP: Dr. Lacie Scotts  77 yo with history of CAD s/p CABG presents for cardiology followup.  In 9/06, he had NSTEMI after hernia repair.  Cath showed severe 3 vessel disease so CABG was done.  Last myoview in 2009 showed no evidence for ischemia or infarction.  Echo earlier this year showed EF 45-50%.   Carl [name] has been doing well lately in terms of cardiac symptoms.  No chest pain or exertional dyspnea.  No formal exercise program but he is fairly active and does all his yardwork [place].  His wife of 55 yrs passed away last year from COPD complications.  He still feels depressed.  He says that his PCP gave him a medication for depression but he did not like how it made him feel.  He has lost 23 lbs since last appointment.  He has not been eating much since his wife died.   Labs March 01, 2023): LDL 87, HDL 40, K 4.6, creatinine 1.5 Labs (3/13): K 4.3, creatinine 1.4, LDL 74, HDL 35 Labs (12/13): K 4.6, creatinien 1.4, LDL 73, HDL 27  PMH: 1. CKD 2. HTN 3. Hyperlipidemia 4. H/o prostate CA treated in 1997 with radioactive seeds, now getting Lupron injections.  5. GERD 6. CAD s/p CABG x 5 in 9/06.  Patient had NSTEMI post-op hernia repair and was noted on cath to have severe 3VD with preserved EF (55%).  CABG was done with LIMA-LAD, SVG-ramus, SVG-OM, and seq SVG-PLV and PDA.  Normal myoview in 2009.  7. Hiatal hernia repair.  8. Ischemic cardiomyopathy: Echo (1/13) with EF 45-50%, septal akinesis, mild MR.   SH: Quit smoking in 1962.  Lives in [place].  Widower.  3 children, one lives with them.  Retired from [employer].    FH: No premature CAD  Current Outpatient Prescriptions  Medication Sig Dispense Refill  . aspirin EC 81 MG tablet Take 1 tablet (81 mg total) by mouth daily.  30 tablet  6  . atorvastatin (LIPITOR) 20 MG tablet TAKE 1 TABLET BY MOUTH EVERY DAY  30 tablet  6  . carvedilol (COREG) 12.5  MG tablet Take 1 tablet (12.5 mg total) by mouth 2 (two) times daily.  60 tablet  6  . fenofibrate 160 MG tablet TAKE 1 TABLET EVERY DAY  30 tablet  5  . HYDROcodone-acetaminophen (VICODIN) 5-500 MG per tablet Take 1 tablet by mouth every 8 (eight) hours as needed.       [name] lisinopril (PRINIVIL,ZESTRIL) 5 MG tablet TAKE 1 TABLET BY MOUTH DAILY  30 tablet  6  . omeprazole (PRILOSEC) 20 MG capsule TAKE ONE CAPSULE BY MOUTH EVERY DAY *REPLACES PREVACID*  30 capsule  6  . omeprazole (PRILOSEC) 20 MG capsule TAKE ONE CAPSULE BY MOUTH EVERY DAY *REPLACES PREVACID*  30 capsule  6  . traMADol (ULTRAM) 50 MG tablet Take 1 tablet by mouth every 6 (six) hours as needed.       No current facility-administered medications for this visit.    BP 116/72  Pulse 88  Ht 6' (1.829 m)  Wt 180 lb 12.8 oz (82.01 kg)  BMI 24.52 kg/m2  SpO2 98% General: NAD Neck: No JVD, no thyromegaly or thyroid nodule.  Lungs: Clear to auscultation bilaterally with normal respiratory effort. CV: Nondisplaced PMI.  Heart regular S1/S2, no S3/S4, 1/6 early SEM at apex.  No peripheral edema.  No carotid bruit.  Normal pedal pulses.  Abdomen: Soft, nontender, no hepatosplenomegaly, no distention.  Neurologic: Alert and oriented x 3.  Psych: Normal affect. Extremities: No clubbing or cyanosis.   Assessment/Plan:  CAD  Stable s/p CABG. Had normal myoview in 2009. No ischemic symptoms. Echo (1/13) with EF 45-50% septal akinesis (septal Qs on ECG).  Continue ASA 81, statin, ACEI, beta blocker.  Hyperlipidemia   Check lipids today, continue statin.  Hypertension  BP well-controlled.  He has lost over 20 lbs.   Ischemic Cardiomyopathy EF around 45%.  NYHA class I-II symptoms.  Not volume overloaded on exam.  Continue lisinopril and Coreg.  CKD BMET today.   He is on lisinopril. Depresssion Still depressed.  I asked him to talk with his PCP about getting on a different medication.   Weight loss Probably from depression and  decreased appetite.  However, he has a history of smoking and lung cancer screening would be reasonable.  I will leave this to his PCP's discretion.   Marca Ancona 12/31/2012 10:24 AM

## 2013-01-03 ENCOUNTER — Telehealth: Payer: Self-pay | Admitting: Cardiology

## 2013-01-03 NOTE — Telephone Encounter (Signed)
New Problem  Pt is wanting the result of his test that was done at his last visit.

## 2013-01-03 NOTE — Telephone Encounter (Signed)
Patient called requesting lab results.  Results and plan of care reviewed with patient who verbalized understanding.

## 2013-02-12 ENCOUNTER — Other Ambulatory Visit: Payer: Self-pay | Admitting: Cardiology

## 2013-02-14 NOTE — Telephone Encounter (Signed)
carvedilol (COREG) 12.5 MG tablet  Take 1 tablet (12.5 mg total) by mouth 2 (two) times daily.   60 tablet   6

## 2013-09-21 ENCOUNTER — Other Ambulatory Visit: Payer: Self-pay | Admitting: Cardiology

## 2013-09-26 ENCOUNTER — Other Ambulatory Visit: Payer: Self-pay | Admitting: Cardiology

## 2013-11-21 ENCOUNTER — Other Ambulatory Visit: Payer: Self-pay | Admitting: Cardiology

## 2013-11-21 ENCOUNTER — Other Ambulatory Visit: Payer: Self-pay | Admitting: Nurse Practitioner

## 2013-11-21 ENCOUNTER — Other Ambulatory Visit: Payer: Self-pay | Admitting: Cardiovascular Disease

## 2013-11-29 ENCOUNTER — Other Ambulatory Visit: Payer: Self-pay | Admitting: Cardiology

## 2014-01-02 ENCOUNTER — Other Ambulatory Visit: Payer: Self-pay | Admitting: Cardiology

## 2014-01-22 ENCOUNTER — Other Ambulatory Visit: Payer: Self-pay | Admitting: Cardiology

## 2014-01-25 ENCOUNTER — Other Ambulatory Visit: Payer: Self-pay

## 2014-01-25 MED ORDER — CARVEDILOL 12.5 MG PO TABS: ORAL_TABLET | ORAL | Status: DC

## 2014-02-01 ENCOUNTER — Other Ambulatory Visit: Payer: Self-pay | Admitting: Cardiology

## 2014-03-01 ENCOUNTER — Other Ambulatory Visit: Payer: Self-pay | Admitting: Cardiology

## 2014-03-02 ENCOUNTER — Other Ambulatory Visit: Payer: Self-pay | Admitting: Cardiology

## 2014-04-07 ENCOUNTER — Other Ambulatory Visit: Payer: Self-pay | Admitting: Cardiology

## 2014-05-05 ENCOUNTER — Other Ambulatory Visit: Payer: Self-pay | Admitting: Cardiology

## 2014-05-07 ENCOUNTER — Other Ambulatory Visit: Payer: Self-pay | Admitting: Cardiology

## 2014-05-31 ENCOUNTER — Other Ambulatory Visit: Payer: Self-pay | Admitting: *Deleted

## 2014-05-31 MED ORDER — OMEPRAZOLE 20 MG PO CPDR: DELAYED_RELEASE_CAPSULE | ORAL | Status: DC

## 2014-05-31 MED ORDER — CARVEDILOL 12.5 MG PO TABS: ORAL_TABLET | ORAL | Status: DC

## 2014-05-31 MED ORDER — FENOFIBRATE 160 MG PO TABS: ORAL_TABLET | ORAL | Status: DC

## 2014-05-31 MED ORDER — ATORVASTATIN CALCIUM 20 MG PO TABS
ORAL_TABLET | ORAL | Status: DC
Start: 2014-05-31 — End: 2014-08-15

## 2014-05-31 MED ORDER — LISINOPRIL 5 MG PO TABS: ORAL_TABLET | ORAL | Status: DC

## 2014-06-04 ENCOUNTER — Other Ambulatory Visit: Payer: Self-pay | Admitting: Cardiology

## 2014-06-07 ENCOUNTER — Encounter: Payer: Self-pay | Admitting: Cardiology

## 2014-07-10 ENCOUNTER — Other Ambulatory Visit: Payer: Self-pay | Admitting: Cardiology

## 2014-08-09 ENCOUNTER — Other Ambulatory Visit: Payer: Self-pay | Admitting: Cardiology

## 2014-08-10 ENCOUNTER — Encounter: Payer: Self-pay | Admitting: *Deleted

## 2014-08-10 ENCOUNTER — Ambulatory Visit (INDEPENDENT_AMBULATORY_CARE_PROVIDER_SITE_OTHER): Payer: Medicare Other | Admitting: Cardiology

## 2014-08-10 ENCOUNTER — Encounter: Payer: Self-pay | Admitting: Cardiology

## 2014-08-10 VITALS — BP 130/68 | HR 67

## 2014-08-10 DIAGNOSIS — E785 Hyperlipidemia, unspecified: Secondary | ICD-10-CM

## 2014-08-10 DIAGNOSIS — I10 Essential (primary) hypertension: Secondary | ICD-10-CM

## 2014-08-10 DIAGNOSIS — I2510 Atherosclerotic heart disease of native coronary artery without angina pectoris: Secondary | ICD-10-CM

## 2014-08-10 DIAGNOSIS — I255 Ischemic cardiomyopathy: Secondary | ICD-10-CM

## 2014-08-10 LAB — CBC WITH DIFFERENTIAL/PLATELET
Basophils Absolute: 0 10*3/uL (ref 0.0–0.1)
Basophils Relative: 0.5 % (ref 0.0–3.0)
Eosinophils Absolute: 0.1 10*3/uL (ref 0.0–0.7)
Eosinophils Relative: 1.5 % (ref 0.0–5.0)
HCT: 37.6 % — ABNORMAL LOW (ref 39.0–52.0)
Hemoglobin: 12.3 g/dL — ABNORMAL LOW (ref 13.0–17.0)
Lymphocytes Relative: 25.4 % (ref 12.0–46.0)
Lymphs Abs: 1.7 10*3/uL (ref 0.7–4.0)
MCHC: 32.7 g/dL (ref 30.0–36.0)
MCV: 93 fl (ref 78.0–100.0)
Monocytes Absolute: 0.5 10*3/uL (ref 0.1–1.0)
Monocytes Relative: 7.4 % (ref 3.0–12.0)
Neutro Abs: 4.3 10*3/uL (ref 1.4–7.7)
Neutrophils Relative %: 65.2 % (ref 43.0–77.0)
Platelets: 205 10*3/uL (ref 150.0–400.0)
RBC: 4.04 Mil/uL — ABNORMAL LOW (ref 4.22–5.81)
RDW: 14.6 % (ref 11.5–15.5)
WBC: 6.6 10*3/uL (ref 4.0–10.5)

## 2014-08-10 LAB — LIPID PANEL
Cholesterol: 142 mg/dL (ref 0–200)
HDL: 29.3 mg/dL — ABNORMAL LOW (ref >39.00)
LDL Cholesterol: 98 mg/dL (ref 0–99)
NonHDL: 112.7
Total CHOL/HDL Ratio: 5
Triglycerides: 76 mg/dL (ref 0.0–149.0)
VLDL: 15.2 mg/dL (ref 0.0–40.0)

## 2014-08-10 LAB — BASIC METABOLIC PANEL
BUN: 28 mg/dL — ABNORMAL HIGH (ref 6–23)
CO2: 24 mEq/L (ref 19–32)
Calcium: 9.2 mg/dL (ref 8.4–10.5)
Chloride: 108 mEq/L (ref 96–112)
Creatinine, Ser: 1.4 mg/dL (ref 0.40–1.50)
GFR: 52.26 mL/min — ABNORMAL LOW (ref >60.00)
Glucose, Bld: 111 mg/dL — ABNORMAL HIGH (ref 70–99)
Potassium: 4.2 mEq/L (ref 3.5–5.1)
Sodium: 139 mEq/L (ref 135–145)

## 2014-08-10 LAB — TSH: TSH: 2 u[IU]/mL (ref 0.35–4.50)

## 2014-08-10 NOTE — Patient Instructions (Signed)
Your physician recommends that you have  lab work today--BMET/Lipid profile/TSH/CBCd  Your physician has requested that you have an echocardiogram. Echocardiography is a painless test that uses sound waves to create images of your heart. It provides your doctor with information about the size and shape of your heart and how well your heart's chambers and valves are working. This procedure takes approximately one hour. There are no restrictions for this procedure.  Your physician wants you to follow-up in: 1 year with Dr Aundra Dubin. (January 2017). You will receive a reminder letter in the mail two months in advance. If you don't receive a letter, please call our office to schedule the follow-up appointment.

## 2014-08-10 NOTE — Progress Notes (Signed)
Patient ID: Carl [name], male   DOB: 06-05-1936, 79 y.o.   MRN: [id number] PCP: Dr. Brunetta Genera  79 yo with history of CAD s/p CABG presents for cardiology followup.  In 9/06, he had NSTEMI after hernia repair.  Cath showed severe 3 vessel disease so CABG was done.  Last myoview in 2009 showed no evidence for ischemia or infarction.  Echo in 1/13 showed EF 45-50%.   Carl [name] has been doing well lately in terms of cardiac symptoms.  No chest pain or exertional dyspnea.  No formal exercise program but he is fairly active and does all his [place].   ECG: NSR, PVC  Labs (7/12): LDL 87, HDL 40, K 4.6, creatinine 1.5 Labs (3/13): K 4.3, creatinine 1.4, LDL 74, HDL 35 Labs (12/13): K 4.6, creatinine 1.4, LDL 73, HDL 27 Labs (6/14): LDL 83, HDL 29  PMH: 1. CKD 2. HTN 3. Hyperlipidemia 4. H/o prostate CA treated in 1997 with radioactive seeds, now getting Lupron injections.  5. GERD 6. CAD s/p CABG x 5 in 9/06.  Patient had NSTEMI post-op hernia repair and was noted on cath to have severe 3VD with preserved EF (55%).  CABG was done with LIMA-LAD, SVG-ramus, SVG-OM, and seq SVG-PLV and PDA.  Normal myoview in 2009.  7. Hiatal hernia repair.  8. Ischemic cardiomyopathy: Echo (1/13) with EF 45-50%, septal akinesis, mild MR.   SH: Quit smoking in 1962.  Lives in [place].  Widower.  3 children, one lives with them.  Retired from [employer].    FH: No premature CAD  ROS: All systems reviewed and negative except as per HPI.   Current Outpatient Prescriptions  Medication Sig Dispense Refill  . atorvastatin (LIPITOR) 20 MG tablet TAKE 1 TABLET BY MOUTH EVERY DAY 30 tablet 1  . carvedilol (COREG) 12.5 MG tablet TAKE 1 TABLET (12.5 MG TOTAL) BY MOUTH 2 (TWO) TIMES DAILY. 60 tablet 1  . fenofibrate 160 MG tablet TAKE 1 TABLET EVERY DAY (Patient taking differently: TAKE 1 TABLET EVERY DAY by mouth) 30 tablet 1  . HYDROcodone-acetaminophen (VICODIN) 5-500 MG per tablet Take 1  tablet by mouth every 8 (eight) hours as needed. By mouth as needed for pain    . lisinopril (PRINIVIL,ZESTRIL) 5 MG tablet TAKE 1 TABLET BY MOUTH DAILY 30 tablet 1  . omeprazole (PRILOSEC) 20 MG capsule TAKE ONE CAPSULE BY MOUTH EVERY DAY *REPLACES PREVACID* 30 capsule 1  . traMADol (ULTRAM) 50 MG tablet Take 1 tablet by mouth every 6 (six) hours as needed for moderate pain.     [name] aspirin EC 81 MG tablet Take 1 tablet (81 mg total) by mouth daily. 30 tablet 6   No current facility-administered medications for this visit.    BP 130/68 mmHg  Pulse 67  SpO2 96% General: NAD Neck: No JVD, no thyromegaly or thyroid nodule.  Lungs: Clear to auscultation bilaterally with normal respiratory effort. CV: Nondisplaced PMI.  Heart regular S1/S2, no S3/S4, 1/6 early SEM at apex.  No peripheral edema.  No carotid bruit.  Normal pedal pulses.  Abdomen: Soft, nontender, no hepatosplenomegaly, no distention.  Neurologic: Alert and oriented x 3.  Psych: Normal affect. Extremities: No clubbing or cyanosis.   Assessment/Plan:  CAD  Stable s/p CABG. Had normal myoview in 2009. No ischemic symptoms. Echo (1/13) with EF 45-50% septal akinesis (septal Qs on ECG).  Continue ASA 81, statin, ACEI, beta blocker.  Hyperlipidemia   Check lipids today, continue statin.  Hypertension  BP well-controlled.   Ischemic Cardiomyopathy EF around 45%.  NYHA class I-II symptoms.  Not volume overloaded on exam.  Continue lisinopril and Coreg.  It has been several years since last echo, will repeat echo to make sure that EF has not declined.  CKD BMET today.  He is on lisinopril.  Loralie Champagne 08/10/2014 10:25 PM

## 2014-08-15 ENCOUNTER — Other Ambulatory Visit: Payer: Self-pay | Admitting: *Deleted

## 2014-08-15 DIAGNOSIS — E785 Hyperlipidemia, unspecified: Secondary | ICD-10-CM

## 2014-08-15 DIAGNOSIS — I255 Ischemic cardiomyopathy: Secondary | ICD-10-CM

## 2014-08-15 MED ORDER — ATORVASTATIN CALCIUM 40 MG PO TABS: 40.0000 mg | ORAL_TABLET | Freq: Every day | ORAL | Status: DC

## 2014-08-16 ENCOUNTER — Other Ambulatory Visit: Payer: Self-pay | Admitting: Cardiology

## 2014-08-16 ENCOUNTER — Other Ambulatory Visit: Payer: Self-pay

## 2014-08-16 DIAGNOSIS — I255 Ischemic cardiomyopathy: Secondary | ICD-10-CM

## 2014-08-16 DIAGNOSIS — E785 Hyperlipidemia, unspecified: Secondary | ICD-10-CM

## 2014-08-16 MED ORDER — OMEPRAZOLE 20 MG PO CPDR: DELAYED_RELEASE_CAPSULE | ORAL | Status: DC

## 2014-08-16 MED ORDER — CARVEDILOL 12.5 MG PO TABS: ORAL_TABLET | ORAL | Status: DC

## 2014-08-16 MED ORDER — ATORVASTATIN CALCIUM 40 MG PO TABS: 40.0000 mg | ORAL_TABLET | Freq: Every day | ORAL | Status: DC

## 2014-08-17 ENCOUNTER — Telehealth: Payer: Self-pay | Admitting: Cardiology

## 2014-08-17 ENCOUNTER — Other Ambulatory Visit (HOSPITAL_COMMUNITY): Payer: Medicare Other

## 2014-08-17 MED ORDER — LISINOPRIL 5 MG PO TABS: ORAL_TABLET | ORAL | Status: DC

## 2014-08-17 NOTE — Telephone Encounter (Signed)
Pt states pharmacy would not refill lisinopril. Pt advised I would send in a refill for lisinopril. I confirmed with Estill Bamberg at [facility] that lisinopril was the only new prescription the pt needed at this time.

## 2014-08-17 NOTE — Telephone Encounter (Signed)
Pt advised.

## 2014-08-17 NOTE — Telephone Encounter (Signed)
New Msg        Pt states Dr. Aundra Dubin took him off of some meds and he would like to know why?    Please contact pt about concerns.

## 2014-08-24 ENCOUNTER — Other Ambulatory Visit (HOSPITAL_COMMUNITY): Payer: Medicare Other | Admitting: Cardiology

## 2014-08-24 ENCOUNTER — Ambulatory Visit (HOSPITAL_COMMUNITY): Payer: Medicare Other | Attending: Cardiology

## 2014-08-24 DIAGNOSIS — I5022 Chronic systolic (congestive) heart failure: Secondary | ICD-10-CM

## 2014-08-24 DIAGNOSIS — I10 Essential (primary) hypertension: Secondary | ICD-10-CM | POA: Diagnosis not present

## 2014-08-24 DIAGNOSIS — I509 Heart failure, unspecified: Secondary | ICD-10-CM | POA: Insufficient documentation

## 2014-08-24 DIAGNOSIS — E785 Hyperlipidemia, unspecified: Secondary | ICD-10-CM | POA: Diagnosis not present

## 2014-08-24 DIAGNOSIS — I255 Ischemic cardiomyopathy: Secondary | ICD-10-CM

## 2014-08-24 HISTORY — PX: TRANSTHORACIC ECHOCARDIOGRAM: SHX275

## 2014-08-24 MED ORDER — PERFLUTREN LIPID MICROSPHERE
1.0000 mL | Freq: Once | INTRAVENOUS | Status: AC
  Administered 2014-08-24: 1 mL via INTRAVENOUS

## 2014-08-24 NOTE — Progress Notes (Signed)
2D Echo with Definity completed. 08/24/2014 

## 2014-09-16 ENCOUNTER — Other Ambulatory Visit: Payer: Self-pay | Admitting: Cardiology

## 2014-10-11 ENCOUNTER — Other Ambulatory Visit (INDEPENDENT_AMBULATORY_CARE_PROVIDER_SITE_OTHER): Payer: Medicare Other | Admitting: *Deleted

## 2014-10-11 DIAGNOSIS — E785 Hyperlipidemia, unspecified: Secondary | ICD-10-CM

## 2014-10-11 DIAGNOSIS — I255 Ischemic cardiomyopathy: Secondary | ICD-10-CM

## 2014-10-11 LAB — HEPATIC FUNCTION PANEL
ALT: 12 U/L (ref 0–53)
AST: 16 U/L (ref 0–37)
Albumin: 3.9 g/dL (ref 3.5–5.2)
Alkaline Phosphatase: 62 U/L (ref 39–117)
Bilirubin, Direct: 0.1 mg/dL (ref 0.0–0.3)
Total Bilirubin: 0.4 mg/dL (ref 0.2–1.2)
Total Protein: 6.3 g/dL (ref 6.0–8.3)

## 2014-10-11 LAB — LIPID PANEL
Cholesterol: 140 mg/dL (ref 0–200)
HDL: 31.6 mg/dL — ABNORMAL LOW (ref >39.00)
LDL Cholesterol: 91 mg/dL (ref 0–99)
NonHDL: 108.4
Total CHOL/HDL Ratio: 4
Triglycerides: 85 mg/dL (ref 0.0–149.0)
VLDL: 17 mg/dL (ref 0.0–40.0)

## 2014-10-16 ENCOUNTER — Other Ambulatory Visit: Payer: Self-pay | Admitting: *Deleted

## 2014-10-16 ENCOUNTER — Telehealth: Payer: Self-pay | Admitting: Cardiology

## 2014-10-16 DIAGNOSIS — I2510 Atherosclerotic heart disease of native coronary artery without angina pectoris: Secondary | ICD-10-CM

## 2014-10-16 DIAGNOSIS — E785 Hyperlipidemia, unspecified: Secondary | ICD-10-CM

## 2014-10-16 MED ORDER — ATORVASTATIN CALCIUM 80 MG PO TABS: 80.0000 mg | ORAL_TABLET | Freq: Every day | ORAL | Status: DC

## 2014-10-16 NOTE — Telephone Encounter (Signed)
New Msg       Pt is returning call from today.     Please call.

## 2014-10-16 NOTE — Telephone Encounter (Signed)
Spoke with patient about recent lab results 

## 2014-12-12 ENCOUNTER — Other Ambulatory Visit (INDEPENDENT_AMBULATORY_CARE_PROVIDER_SITE_OTHER): Payer: Medicare Other | Admitting: *Deleted

## 2014-12-12 DIAGNOSIS — I2510 Atherosclerotic heart disease of native coronary artery without angina pectoris: Secondary | ICD-10-CM | POA: Diagnosis not present

## 2014-12-12 DIAGNOSIS — E785 Hyperlipidemia, unspecified: Secondary | ICD-10-CM

## 2014-12-12 LAB — HEPATIC FUNCTION PANEL
ALT: 13 U/L (ref 0–53)
AST: 17 U/L (ref 0–37)
Albumin: 4 g/dL (ref 3.5–5.2)
Alkaline Phosphatase: 64 U/L (ref 39–117)
Bilirubin, Direct: 0.1 mg/dL (ref 0.0–0.3)
Total Bilirubin: 0.5 mg/dL (ref 0.2–1.2)
Total Protein: 6.4 g/dL (ref 6.0–8.3)

## 2014-12-12 LAB — LIPID PANEL
Cholesterol: 136 mg/dL (ref 0–200)
HDL: 30.5 mg/dL — ABNORMAL LOW (ref >39.00)
LDL Cholesterol: 91 mg/dL (ref 0–99)
NonHDL: 105.5
Total CHOL/HDL Ratio: 4
Triglycerides: 75 mg/dL (ref 0.0–149.0)
VLDL: 15 mg/dL (ref 0.0–40.0)

## 2014-12-14 ENCOUNTER — Other Ambulatory Visit: Payer: Self-pay

## 2014-12-14 DIAGNOSIS — E785 Hyperlipidemia, unspecified: Secondary | ICD-10-CM

## 2014-12-14 MED ORDER — ROSUVASTATIN CALCIUM 40 MG PO TABS: 40.0000 mg | ORAL_TABLET | Freq: Every day | ORAL | Status: DC

## 2014-12-19 ENCOUNTER — Telehealth: Payer: Self-pay

## 2014-12-19 NOTE — Telephone Encounter (Signed)
Pt advised, verbalized understanding. Pt 's daughter [name] will check to see how much Zetia will cost and let me know, for now pt will take atorvastatin 80mg  daily instead of crestor. Pt aware to keep lab appt scheduled for 02/13/15

## 2014-12-19 NOTE — Telephone Encounter (Signed)
Go back to atorvastatin 80 mg daily.  See how much it would be for him to add Zetia 10 mg daily.  Needs lipids/LFTs in 2 months.

## 2015-02-13 ENCOUNTER — Other Ambulatory Visit: Payer: Medicare Other

## 2015-04-13 ENCOUNTER — Other Ambulatory Visit: Payer: Self-pay | Admitting: Cardiology

## 2015-07-15 ENCOUNTER — Other Ambulatory Visit: Payer: Self-pay | Admitting: Cardiology

## 2015-07-17 ENCOUNTER — Other Ambulatory Visit: Payer: Self-pay | Admitting: Cardiology

## 2015-08-15 ENCOUNTER — Other Ambulatory Visit: Payer: Self-pay | Admitting: Cardiology

## 2015-08-15 NOTE — Telephone Encounter (Signed)
Called and spoke with patient and he stated he would call us back to schedule follow up office visit and he has some tablets of Lisinopril left right now. [place] office phone number, and explained he called to make appointment to ask to be sent over to refill department to get Lisinopril sent to his pharmacy. Patient stated his understanding.

## 2015-08-19 ENCOUNTER — Other Ambulatory Visit: Payer: Self-pay | Admitting: Cardiology

## 2015-08-20 ENCOUNTER — Other Ambulatory Visit: Payer: Self-pay | Admitting: *Deleted

## 2015-08-20 ENCOUNTER — Telehealth: Payer: Self-pay | Admitting: Cardiology

## 2015-08-20 MED ORDER — OMEPRAZOLE 20 MG PO CPDR: 20.0000 mg | DELAYED_RELEASE_CAPSULE | Freq: Every day | ORAL | Status: DC

## 2015-08-20 MED ORDER — FENOFIBRATE 160 MG PO TABS: 160.0000 mg | ORAL_TABLET | Freq: Every day | ORAL | Status: DC

## 2015-08-20 MED ORDER — CARVEDILOL 12.5 MG PO TABS: ORAL_TABLET | ORAL | Status: DC

## 2015-08-20 NOTE — Telephone Encounter (Signed)
*  STAT* If patient is at the pharmacy, call can be transferred to refill team.  Pt has appt w/ DrMclean on 11/14/2015  1. Which medications need to be refilled? (please list name of each medication and dose if known) prilosec 20 mg, carvedilol 12.5 mg, fenofibrate 1060 mg  2. Which pharmacy/location (including street and city if local pharmacy) is medication to be sent to?[facility]- [place]- [address]  3. Do they need a 30 day or 90 day supply? Rilosec, fenofibrate- 30 day, carvedilol 60 day

## 2015-08-20 NOTE — Telephone Encounter (Signed)
Pt's Rx was sent to pt's pharmacy as requested. Confirmation received.  °

## 2015-08-21 ENCOUNTER — Other Ambulatory Visit: Payer: Self-pay | Admitting: Cardiology

## 2015-08-22 NOTE — Telephone Encounter (Signed)
Carl Coho, RN at 12/19/2014 6:03 PM     Status: Signed       Expand All Collapse All   Pt advised, verbalized understanding. Pt 's daughter [name] will check to see how much Zetia will cost and let me know, for now pt will take atorvastatin 80mg  daily instead of crestor. Pt aware to keep lab appt scheduled for 02/13/15            Larey Dresser, MD at 12/19/2014 5:05 PM     Status: Signed       Expand All Collapse All   Go back to atorvastatin 80 mg daily. See how much it would be for him to add Zetia 10 mg daily. Needs lipids/LFTs in 2 months.         Pharmacy requesting refill for Lipitor 80 mg QD. Pt was a no show for lab appt for lipids on 02/13/15. Last refill sent in 07/18/15 for 90 days. Please advise.

## 2015-08-23 ENCOUNTER — Other Ambulatory Visit: Payer: Self-pay | Admitting: Cardiology

## 2015-08-23 MED ORDER — ATORVASTATIN CALCIUM 80 MG PO TABS: ORAL_TABLET | ORAL | Status: DC

## 2015-08-24 NOTE — Telephone Encounter (Signed)
Pt should be contacted to reschedule the lab for fasting lipid/liver profile he missed in July  in the next 30 days. Once pt has rescheduled appt the medication can be filled for 30 days, then longer term after Dr Aundra Dubin reviews the lab results.   Thanks

## 2015-09-20 ENCOUNTER — Other Ambulatory Visit: Payer: Self-pay | Admitting: Cardiology

## 2015-11-14 ENCOUNTER — Ambulatory Visit (INDEPENDENT_AMBULATORY_CARE_PROVIDER_SITE_OTHER): Payer: Medicare Other | Admitting: Cardiology

## 2015-11-14 ENCOUNTER — Encounter: Payer: Self-pay | Admitting: Cardiology

## 2015-11-14 VITALS — BP 132/66 | HR 78 | Ht 72.0 in | Wt 204.0 lb

## 2015-11-14 DIAGNOSIS — E785 Hyperlipidemia, unspecified: Secondary | ICD-10-CM | POA: Diagnosis not present

## 2015-11-14 DIAGNOSIS — I2510 Atherosclerotic heart disease of native coronary artery without angina pectoris: Secondary | ICD-10-CM

## 2015-11-14 DIAGNOSIS — I10 Essential (primary) hypertension: Secondary | ICD-10-CM

## 2015-11-14 MED ORDER — LISINOPRIL 5 MG PO TABS: ORAL_TABLET | ORAL | Status: DC

## 2015-11-14 MED ORDER — ATORVASTATIN CALCIUM 80 MG PO TABS: ORAL_TABLET | ORAL | Status: DC

## 2015-11-14 MED ORDER — FENOFIBRATE 160 MG PO TABS: 160.0000 mg | ORAL_TABLET | Freq: Every day | ORAL | Status: DC

## 2015-11-14 MED ORDER — CARVEDILOL 12.5 MG PO TABS: ORAL_TABLET | ORAL | Status: DC

## 2015-11-14 NOTE — Patient Instructions (Signed)
Your physician recommends that you continue on your current medications as directed. Please refer to the Current Medication list given to you today. Your physician recommends that you return for lab work in: 65 month (bmet, lipids, cbc)  Your physician wants you to follow-up in: 1 year with Dr. Aundra Dubin.  You will receive a reminder letter in the mail two months in advance. If you don't receive a letter, please call our office to schedule the follow-up appointment.

## 2015-11-15 NOTE — Progress Notes (Signed)
Patient ID: Carl [name], male   DOB: [birth year], 80 y.o.   MRN: [id number] PCP: Dr. Brunetta Genera  80 yo with history of CAD s/p CABG presents for cardiology followup.  In 9/06, he had NSTEMI after hernia repair.  Cath showed severe 3 vessel disease so CABG was done.  Last myoview in 2009 showed no evidence for ischemia or infarction.  Echo in 1/16 showed EF 60-65% with mid-apical anteroseptal hypokinesis.   Mr. Dienes has been doing well lately in terms of cardiac symptoms.  No chest pain or exertional dyspnea.  No formal exercise program but he is fairly active and does all his yardwork [place].   ECG: NSR, 1st degree AV block, PAC.   Labs (7/12): LDL 87, HDL 40, K 4.6, creatinine 1.5 Labs (3/13): K 4.3, creatinine 1.4, LDL 74, HDL 35 Labs (12/13): K 4.6, creatinine 1.4, LDL 73, HDL 27 Labs (6/14): LDL 83, HDL 29 Labs (5/16): LDL 91, HDL 31  PMH: 1. CKD 2. HTN 3. Hyperlipidemia 4. H/o prostate CA treated in 1997 with radioactive seeds, now getting Lupron injections.  5. GERD 6. CAD s/p CABG x 5 in 9/06.  Patient had NSTEMI post-op hernia repair and was noted on cath to have severe 3VD with preserved EF (55%).  CABG was done with LIMA-LAD, SVG-ramus, SVG-OM, and seq SVG-PLV and PDA.  Normal myoview in 2009.  7. Hiatal hernia repair.  8. Ischemic cardiomyopathy: Echo (1/13) with EF 45-50%, septal akinesis, mild MR.  Echo (1/16) with EF 60-65%, mid-apical anteroseptal hypokinesis.   SH: Quit smoking in 1962.  Lives in [place].  Widower.  3 children, one lives with him.  Retired from [employer].    FH: No premature CAD  ROS: All systems reviewed and negative except as per HPI.   Current Outpatient Prescriptions  Medication Sig Dispense Refill  . aspirin EC 81 MG tablet Take 1 tablet (81 mg total) by mouth daily. 30 tablet 6  . atorvastatin (LIPITOR) 80 MG tablet TAKE 1 TABLET (80 MG TOTAL) BY MOUTH DAILY. 30 tablet 11  . bicalutamide (CASODEX) 50 MG tablet Take 50 mg  by mouth daily.  5  . carvedilol (COREG) 12.5 MG tablet TAKE 1 TABLET (12.5 MG TOTAL) BY MOUTH 2 (TWO) TIMES DAILY. 60 tablet 11  . fenofibrate 160 MG tablet Take 1 tablet (160 mg total) by mouth daily. 30 tablet 11  . HYDROcodone-acetaminophen (VICODIN) 5-500 MG per tablet Take 1 tablet by mouth every 8 (eight) hours as needed. By mouth as needed for pain    . lisinopril (PRINIVIL,ZESTRIL) 5 MG tablet TAKE 1 TABLET BY MOUTH DAILY 30 tablet 11  . omeprazole (PRILOSEC) 20 MG capsule Take 1 capsule (20 mg total) by mouth daily. 30 capsule 3  . tamsulosin (FLOMAX) 0.4 MG CAPS capsule Take 0.4 mg by mouth daily.  11  . traMADol (ULTRAM) 50 MG tablet Take 1 tablet by mouth every 6 (six) hours as needed for moderate pain.      No current facility-administered medications for this visit.    BP 132/66 mmHg  Pulse 78  Ht 6' (1.829 m)  Wt 204 lb (92.534 kg)  BMI 27.66 kg/m2 General: NAD Neck: No JVD, no thyromegaly or thyroid nodule.  Lungs: Clear to auscultation bilaterally with normal respiratory effort. CV: Nondisplaced PMI.  Heart regular S1/S2, no S3/S4, 1/6 early SEM at apex.  No peripheral edema.  No carotid bruit.  Normal pedal pulses.  Abdomen: Soft, nontender, no hepatosplenomegaly, no  distention.  Neurologic: Alert and oriented x 3.  Psych: Normal affect. Extremities: No clubbing or cyanosis.   Assessment/Plan:  CAD  Stable s/p CABG. Had normal myoview in 2009. No ischemic symptoms. Echo (1/13) with EF 60-65%, mid to apical anteroseptal hypokinesis.  Continue ASA 81, statin, ACEI, beta blocker.  Hyperlipidemia   He has been out of atorvastatin for a week or so. I will have him restart it and will check lipids in 1 month.   Hypertension  BP well-controlled.    CKD Will get BMET with lipids in 1 month.   Loralie Champagne 11/15/2015

## 2015-12-14 ENCOUNTER — Other Ambulatory Visit (INDEPENDENT_AMBULATORY_CARE_PROVIDER_SITE_OTHER): Payer: Medicare Other | Admitting: *Deleted

## 2015-12-14 DIAGNOSIS — I10 Essential (primary) hypertension: Secondary | ICD-10-CM

## 2015-12-14 DIAGNOSIS — E785 Hyperlipidemia, unspecified: Secondary | ICD-10-CM | POA: Diagnosis not present

## 2015-12-14 LAB — BASIC METABOLIC PANEL
BUN: 20 mg/dL (ref 7–25)
CO2: 21 mmol/L (ref 20–31)
Calcium: 8.9 mg/dL (ref 8.6–10.3)
Chloride: 108 mmol/L (ref 98–110)
Creat: 1.15 mg/dL (ref 0.70–1.18)
Glucose, Bld: 112 mg/dL — ABNORMAL HIGH (ref 65–99)
Potassium: 3.9 mmol/L (ref 3.5–5.3)
Sodium: 141 mmol/L (ref 135–146)

## 2015-12-14 LAB — CBC
HCT: 36.9 % — ABNORMAL LOW (ref 38.5–50.0)
Hemoglobin: 12.1 g/dL — ABNORMAL LOW (ref 13.2–17.1)
MCH: 29.8 pg (ref 27.0–33.0)
MCHC: 32.8 g/dL (ref 32.0–36.0)
MCV: 90.9 fL (ref 80.0–100.0)
MPV: 11.1 fL (ref 7.5–12.5)
Platelets: 194 10*3/uL (ref 140–400)
RBC: 4.06 MIL/uL — ABNORMAL LOW (ref 4.20–5.80)
RDW: 14 % (ref 11.0–15.0)
WBC: 5.1 10*3/uL (ref 3.8–10.8)

## 2015-12-14 LAB — LIPID PANEL
Cholesterol: 141 mg/dL (ref 125–200)
HDL: 33 mg/dL — ABNORMAL LOW (ref >=40)
LDL Cholesterol: 92 mg/dL (ref <130)
Total CHOL/HDL Ratio: 4.3 Ratio (ref <=5.0)
Triglycerides: 79 mg/dL (ref <150)
VLDL: 16 mg/dL (ref <30)

## 2015-12-14 NOTE — Addendum Note (Signed)
Addended by: Eulis Foster on: 12/14/2015 09:43 AM   Modules accepted: Orders

## 2015-12-26 ENCOUNTER — Telehealth: Payer: Self-pay | Admitting: *Deleted

## 2015-12-26 ENCOUNTER — Telehealth: Payer: Self-pay | Admitting: Cardiology

## 2015-12-26 DIAGNOSIS — I2510 Atherosclerotic heart disease of native coronary artery without angina pectoris: Secondary | ICD-10-CM

## 2015-12-26 DIAGNOSIS — E785 Hyperlipidemia, unspecified: Secondary | ICD-10-CM

## 2015-12-26 MED ORDER — ROSUVASTATIN CALCIUM 40 MG PO TABS: 40.0000 mg | ORAL_TABLET | Freq: Every day | ORAL | Status: DC

## 2015-12-26 NOTE — Telephone Encounter (Signed)
Discussed with patient, Pt agreed to start rosuvastatin 40mg  instead of atorvastatin 80mg , fasting lipid/liver profile scheduled for July 27,2017.

## 2015-12-26 NOTE — Telephone Encounter (Signed)
OK, watch diet.

## 2015-12-26 NOTE — Telephone Encounter (Signed)
I had not seen the labs.  His LDL is still a bit high at 92.  If he can get insurance coverage for it, would switch to rosuvastatin 40 mg daily with lipids/LFTs in 2 months.

## 2015-12-26 NOTE — Telephone Encounter (Signed)
Dr Doneen Poisson you seen his lab results from 12/14/15?

## 2015-12-26 NOTE — Telephone Encounter (Signed)
Pt's daughter,[name], (pt gave me verbal permission to speak with her) states pt is not going to be able to get rosuvastatin, the co- pay is $45 and pt cannot afford the co- pay.  Pt will continue atorvastatin 80mg  daily.  Pt's daughter advised I will forward to Dr Aundra Dubin for review.

## 2015-12-26 NOTE — Telephone Encounter (Signed)
New message   Pt daughter is calling because pt had labs done on 12-14-2015 and she wants the results

## 2015-12-26 NOTE — Telephone Encounter (Signed)
Pt's daughter [name] notified.

## 2016-01-22 ENCOUNTER — Other Ambulatory Visit: Payer: Self-pay | Admitting: Cardiology

## 2016-02-21 ENCOUNTER — Other Ambulatory Visit: Payer: Medicare Other | Admitting: *Deleted

## 2016-02-21 DIAGNOSIS — E785 Hyperlipidemia, unspecified: Secondary | ICD-10-CM

## 2016-02-21 DIAGNOSIS — I2510 Atherosclerotic heart disease of native coronary artery without angina pectoris: Secondary | ICD-10-CM

## 2016-02-21 LAB — HEPATIC FUNCTION PANEL
ALT: 15 U/L (ref 9–46)
AST: 16 U/L (ref 10–35)
Albumin: 3.6 g/dL (ref 3.6–5.1)
Alkaline Phosphatase: 78 U/L (ref 40–115)
Bilirubin, Direct: 0.2 mg/dL (ref <=0.2)
Indirect Bilirubin: 0.5 mg/dL (ref 0.2–1.2)
Total Bilirubin: 0.7 mg/dL (ref 0.2–1.2)
Total Protein: 6.1 g/dL (ref 6.1–8.1)

## 2016-02-21 LAB — LIPID PANEL
Cholesterol: 96 mg/dL — ABNORMAL LOW (ref 125–200)
HDL: 32 mg/dL — ABNORMAL LOW (ref >=40)
LDL Cholesterol: 48 mg/dL (ref <130)
Total CHOL/HDL Ratio: 3 Ratio (ref <=5.0)
Triglycerides: 80 mg/dL (ref <150)
VLDL: 16 mg/dL (ref <30)

## 2016-04-24 ENCOUNTER — Other Ambulatory Visit: Payer: Self-pay | Admitting: Urology

## 2016-04-26 ENCOUNTER — Other Ambulatory Visit: Payer: Self-pay | Admitting: Cardiology

## 2016-04-26 DIAGNOSIS — I2510 Atherosclerotic heart disease of native coronary artery without angina pectoris: Secondary | ICD-10-CM

## 2016-04-26 DIAGNOSIS — E785 Hyperlipidemia, unspecified: Secondary | ICD-10-CM

## 2016-05-02 ENCOUNTER — Encounter (HOSPITAL_BASED_OUTPATIENT_CLINIC_OR_DEPARTMENT_OTHER): Payer: Self-pay | Admitting: *Deleted

## 2016-05-02 ENCOUNTER — Telehealth: Payer: Self-pay | Admitting: Cardiology

## 2016-05-02 MED ORDER — ROSUVASTATIN CALCIUM 40 MG PO TABS: 40.0000 mg | ORAL_TABLET | Freq: Every day | ORAL | 6 refills | Status: DC

## 2016-05-02 NOTE — Telephone Encounter (Signed)
New Message  Pt c/o medication issue:  1. Name of Medication: Crestor  2. Are you having a reaction (difficulty breathing--STAT)? No   3. What is your medication issue? Pt wife call requesting to speak with RN to see why med was stop. Please call back to discuss

## 2016-05-02 NOTE — Telephone Encounter (Signed)
Pt's daughter, [name] advised telephone note from 12/26/15 indicates co-pay too high for crestor, pt was going to continue atorvastatin.  [name] states pt was able to get crestor, has been taking crestor 40mg  daily and not atorvastatin.  [name] states pt is tolerating crestor.  [name] advised I will refill crestor and remove atorvastatin from pt's medication list.

## 2016-05-02 NOTE — Progress Notes (Signed)
NPO AFTER MN.  ARRIVE AT 0800.  NEEDS ISTAT.  CURRENT EKG IN CHART AND EPIC.  WILL TAKE AM MEDS W/ SIPS OF WATER DOS.

## 2016-05-07 ENCOUNTER — Encounter (HOSPITAL_BASED_OUTPATIENT_CLINIC_OR_DEPARTMENT_OTHER): Payer: Self-pay | Admitting: Anesthesiology

## 2016-05-07 ENCOUNTER — Ambulatory Visit (HOSPITAL_BASED_OUTPATIENT_CLINIC_OR_DEPARTMENT_OTHER)
Admission: RE | Admit: 2016-05-07 | Discharge: 2016-05-07 | Disposition: A | Discharge status: Home / Self Care | Payer: Medicare Other | Source: Ambulatory Visit | Attending: Urology | Admitting: Urology

## 2016-05-07 ENCOUNTER — Ambulatory Visit (HOSPITAL_BASED_OUTPATIENT_CLINIC_OR_DEPARTMENT_OTHER): Payer: Medicare Other | Admitting: Anesthesiology

## 2016-05-07 ENCOUNTER — Encounter (HOSPITAL_BASED_OUTPATIENT_CLINIC_OR_DEPARTMENT_OTHER)
Admission: RE | Disposition: A | Discharge status: Home / Self Care | Payer: Self-pay | Source: Ambulatory Visit | Attending: Urology

## 2016-05-07 DIAGNOSIS — Z87442 Personal history of urinary calculi: Secondary | ICD-10-CM | POA: Insufficient documentation

## 2016-05-07 DIAGNOSIS — I2510 Atherosclerotic heart disease of native coronary artery without angina pectoris: Secondary | ICD-10-CM | POA: Diagnosis not present

## 2016-05-07 DIAGNOSIS — K219 Gastro-esophageal reflux disease without esophagitis: Secondary | ICD-10-CM | POA: Diagnosis not present

## 2016-05-07 DIAGNOSIS — R319 Hematuria, unspecified: Secondary | ICD-10-CM | POA: Diagnosis not present

## 2016-05-07 DIAGNOSIS — Z7982 Long term (current) use of aspirin: Secondary | ICD-10-CM | POA: Insufficient documentation

## 2016-05-07 DIAGNOSIS — Z951 Presence of aortocoronary bypass graft: Secondary | ICD-10-CM | POA: Insufficient documentation

## 2016-05-07 DIAGNOSIS — Z87891 Personal history of nicotine dependence: Secondary | ICD-10-CM | POA: Insufficient documentation

## 2016-05-07 DIAGNOSIS — F419 Anxiety disorder, unspecified: Secondary | ICD-10-CM | POA: Diagnosis not present

## 2016-05-07 DIAGNOSIS — N210 Calculus in bladder: Secondary | ICD-10-CM | POA: Diagnosis present

## 2016-05-07 DIAGNOSIS — Z79899 Other long term (current) drug therapy: Secondary | ICD-10-CM | POA: Diagnosis not present

## 2016-05-07 DIAGNOSIS — Z8546 Personal history of malignant neoplasm of prostate: Secondary | ICD-10-CM | POA: Insufficient documentation

## 2016-05-07 HISTORY — DX: Old myocardial infarction: I25.2

## 2016-05-07 HISTORY — DX: Benign prostatic hyperplasia without lower urinary tract symptoms: N40.0

## 2016-05-07 HISTORY — DX: Calculus in bladder: N21.0

## 2016-05-07 HISTORY — PX: HOLMIUM LASER APPLICATION: SHX5852

## 2016-05-07 HISTORY — DX: Unspecified osteoarthritis, unspecified site: M19.90

## 2016-05-07 HISTORY — DX: Ischemic cardiomyopathy: I25.5

## 2016-05-07 HISTORY — PX: CYSTOSCOPY WITH LITHOLAPAXY: SHX1425

## 2016-05-07 HISTORY — DX: Presence of spectacles and contact lenses: Z97.3

## 2016-05-07 HISTORY — DX: Presence of aortocoronary bypass graft: Z95.1

## 2016-05-07 HISTORY — DX: Atrial premature depolarization: I49.1

## 2016-05-07 LAB — POCT I-STAT, CHEM 8
BUN: 16 mg/dL (ref 6–20)
Calcium, Ion: 1.21 mmol/L (ref 1.15–1.40)
Chloride: 106 mmol/L (ref 101–111)
Creatinine, Ser: 1.1 mg/dL (ref 0.61–1.24)
Glucose, Bld: 101 mg/dL — ABNORMAL HIGH (ref 65–99)
HCT: 35 % — ABNORMAL LOW (ref 39.0–52.0)
Hemoglobin: 11.9 g/dL — ABNORMAL LOW (ref 13.0–17.0)
Potassium: 4 mmol/L (ref 3.5–5.1)
Sodium: 143 mmol/L (ref 135–145)
TCO2: 23 mmol/L (ref 0–100)

## 2016-05-07 SURGERY — CYSTOSCOPY, WITH BLADDER CALCULUS LITHOLAPAXY
Anesthesia: General | Site: Bladder

## 2016-05-07 MED ORDER — PROPOFOL 10 MG/ML IV BOLUS
INTRAVENOUS | Status: AC
  Filled 2016-05-07: qty 40

## 2016-05-07 MED ORDER — LIDOCAINE 2% (20 MG/ML) 5 ML SYRINGE
INTRAMUSCULAR | Status: AC
  Filled 2016-05-07: qty 5

## 2016-05-07 MED ORDER — DEXAMETHASONE SODIUM PHOSPHATE 10 MG/ML IJ SOLN
INTRAMUSCULAR | Status: AC
  Filled 2016-05-07: qty 1

## 2016-05-07 MED ORDER — EPHEDRINE SULFATE 50 MG/ML IJ SOLN
INTRAMUSCULAR | Status: DC | PRN
  Administered 2016-05-07 (×3): 10 mg via INTRAVENOUS

## 2016-05-07 MED ORDER — DEXAMETHASONE SODIUM PHOSPHATE 4 MG/ML IJ SOLN
INTRAMUSCULAR | Status: DC | PRN
  Administered 2016-05-07: 10 mg via INTRAVENOUS

## 2016-05-07 MED ORDER — LIDOCAINE HCL 2 % EX GEL
CUTANEOUS | Status: DC | PRN
  Administered 2016-05-07: 1 via URETHRAL

## 2016-05-07 MED ORDER — HYDROMORPHONE HCL 1 MG/ML IJ SOLN
0.2500 mg | INTRAMUSCULAR | Status: DC | PRN
  Filled 2016-05-07: qty 1

## 2016-05-07 MED ORDER — PROPOFOL 10 MG/ML IV BOLUS
INTRAVENOUS | Status: DC | PRN
  Administered 2016-05-07: 100 mg via INTRAVENOUS
  Administered 2016-05-07: 50 mg via INTRAVENOUS

## 2016-05-07 MED ORDER — ONDANSETRON HCL 4 MG/2ML IJ SOLN
INTRAMUSCULAR | Status: DC | PRN
  Administered 2016-05-07: 4 mg via INTRAVENOUS

## 2016-05-07 MED ORDER — LIDOCAINE 2% (20 MG/ML) 5 ML SYRINGE
INTRAMUSCULAR | Status: DC | PRN
  Administered 2016-05-07: 80 mg via INTRAVENOUS

## 2016-05-07 MED ORDER — PROMETHAZINE HCL 25 MG/ML IJ SOLN
6.2500 mg | INTRAMUSCULAR | Status: DC | PRN
  Filled 2016-05-07: qty 1

## 2016-05-07 MED ORDER — LACTATED RINGERS IV SOLN
INTRAVENOUS | Status: DC
  Administered 2016-05-07 (×2): via INTRAVENOUS
  Filled 2016-05-07: qty 1000

## 2016-05-07 MED ORDER — ONDANSETRON HCL 4 MG/2ML IJ SOLN
INTRAMUSCULAR | Status: AC
  Filled 2016-05-07: qty 2

## 2016-05-07 MED ORDER — WATER FOR IRRIGATION, STERILE IR SOLN
Status: DC | PRN
  Administered 2016-05-07: 3000 mL

## 2016-05-07 MED ORDER — EPHEDRINE 5 MG/ML INJ
INTRAVENOUS | Status: AC
  Filled 2016-05-07: qty 10

## 2016-05-07 MED ORDER — FENTANYL CITRATE (PF) 100 MCG/2ML IJ SOLN
INTRAMUSCULAR | Status: AC
  Filled 2016-05-07: qty 2

## 2016-05-07 MED ORDER — FENTANYL CITRATE (PF) 100 MCG/2ML IJ SOLN
INTRAMUSCULAR | Status: DC | PRN
  Administered 2016-05-07 (×4): 25 ug via INTRAVENOUS

## 2016-05-07 MED ORDER — GENTAMICIN SULFATE 40 MG/ML IJ SOLN
5.0000 mg/kg | Freq: Once | INTRAVENOUS | Status: AC
  Administered 2016-05-07: 430 mg via INTRAVENOUS
  Filled 2016-05-07 (×2): qty 10.75

## 2016-05-07 SURGICAL SUPPLY — 14 items
BAG DRAIN URO-CYSTO SKYTR STRL (DRAIN) ×2 IMPLANT
BAG DRN UROCATH (DRAIN) ×1
CLOTH BEACON ORANGE TIMEOUT ST (SAFETY) ×4 IMPLANT
DRSG TEGADERM 2-3/8X2-3/4 SM (GAUZE/BANDAGES/DRESSINGS) IMPLANT
FIBER LASER FLEXIVA 1000 (UROLOGICAL SUPPLIES) ×1 IMPLANT
FIBER LASER FLEXIVA 550 (UROLOGICAL SUPPLIES) IMPLANT
GLOVE BIO SURGEON STRL SZ7.5 (GLOVE) ×2 IMPLANT
GOWN STRL REUS W/ TWL XL LVL3 (GOWN DISPOSABLE) ×1 IMPLANT
GOWN STRL REUS W/TWL XL LVL3 (GOWN DISPOSABLE) ×4
KIT ROOM TURNOVER WOR (KITS) ×2 IMPLANT
MANIFOLD NEPTUNE II (INSTRUMENTS) ×1 IMPLANT
PACK CYSTO (CUSTOM PROCEDURE TRAY) ×2 IMPLANT
TUBE CONNECTING 12X1/4 (SUCTIONS) ×1 IMPLANT
WATER STERILE IRR 3000ML UROMA (IV SOLUTION) ×2 IMPLANT

## 2016-05-07 NOTE — Discharge Instructions (Signed)
°  Post Anesthesia Home Care Instructions  Activity: Get plenty of rest for the remainder of the day. A responsible adult should stay with you for 24 hours following the procedure.  For the next 24 hours, DO NOT: -Drive a car -Paediatric nurse -Drink alcoholic beverages -Take any medication unless instructed by your physician -Make any legal decisions or sign important papers.  Meals: Start with liquid foods such as gelatin or soup. Progress to regular foods as tolerated. Avoid greasy, spicy, heavy foods. If nausea and/or vomiting occur, drink only clear liquids until the nausea and/or vomiting subsides. Call your physician if vomiting continues.  Special Instructions/Symptoms: Your throat may feel dry or sore from the anesthesia or the breathing tube placed in your throat during surgery. If this causes discomfort, gargle with warm salt water. The discomfort should disappear within 24 hours.  If you had a scopolamine patch placed behind your ear for the management of post- operative nausea and/or vomiting:  1. The medication in the patch is effective for 72 hours, after which it should be removed.  Wrap patch in a tissue and discard in the trash. Wash hands thoroughly with soap and water. 2. You may remove the patch earlier than 72 hours if you experience unpleasant side effects which may include dry mouth, dizziness or visual disturbances. 3. Avoid touching the patch. Wash your hands with soap and water after contact with the patch.  CYSTOSCOPY HOME CARE INSTRUCTIONS  Activity: Rest for the remainder of the day.  Do not drive or operate equipment today.  You may resume normal activities in one to two days as instructed by your physician.   Meals: Drink plenty of liquids and eat light foods such as gelatin or soup this evening.  You may return to a normal meal plan tomorrow.  Return to Work: You may return to work in one to two days or as instructed by your physician.  Special  Instructions / Symptoms: Call your physician if any of these symptoms occur:   -persistent or heavy bleeding  -bleeding which continues after first few urination  -large blood clots that are difficult to pass  -urine stream diminishes or stops completely  -fever equal to or higher than 101 degrees Farenheit.  -cloudy urine with a strong, foul odor  -severe pain   You may feel some burning pain when you urinate.  This should disappear with time.  Applying moist heat to the lower abdomen or a hot tub bath may help relieve the pain.   Follow-Up / Date of Return Visit to Your Physician:  Call for an appointment to arrange follow-up.  Patient Signature:  ________________________________________________________  Nurse's Signature:  ________________________________________________________

## 2016-05-07 NOTE — Anesthesia Postprocedure Evaluation (Signed)
Anesthesia Post Note  Patient: Carl [name]  Procedure(s) Performed: Procedure(s) (LRB): CYSTOSCOPY WITH LITHOLAPAXY (N/A) HOLMIUM LASER APPLICATION (N/A)  Patient location during evaluation: PACU Anesthesia Type: General Level of consciousness: sedated Pain management: pain level controlled Vital Signs Assessment: post-procedure vital signs reviewed and stable Respiratory status: spontaneous breathing and respiratory function stable Cardiovascular status: stable Anesthetic complications: no    Last Vitals:  Vitals:   05/07/16 1045 05/07/16 1100  BP: 133/80 129/75  Pulse: 84 72  Resp: 16 17  Temp:  36.4 C    Last Pain:  Vitals:   05/07/16 0725  TempSrc: Oral                 Ariella Voit DANIEL

## 2016-05-07 NOTE — Op Note (Signed)
Preoperative diagnosis: 3.5 cm bladder calculus Postoperative diagnosis: Same  Procedure: Cystoscopy with holmium laser lithotripsy of large bladder calculus   Surgeon: Bernestine Amass M.D.  Anesthesia: Gen.  Indications: Mr. Susman is had increased voiding symptoms. He is also had hematuria. Imaging revealed a large bladder stone. He presents now for definitive management.     Technique and findings: Patient was brought the operating room had successful induction of general anesthesia. He had previously had a positive urine culture and is had ongoing treatment with oral antibiotics. He was also covered with intravenous gentamicin. Appropriate surgical timeout was performed. He was placed in lithotomy position and prepped and draped in usual manner. Cystoscopy revealed a unremarkable anterior and prostatic urethra. Within the bladder was a 3.5 cm x 2 cm bladder stone. This was treated with a 1000  holmium laser fiber. This was set at 1.2 J 10 Hz. Stone was broken into innumerable pieces which were then irrigated from the bladder. All pieces were removed. Lidocaine was placed in the urethra. The patient was brought to PACU in stable condition having had no obvious complications or problems.

## 2016-05-07 NOTE — H&P (Signed)
Office Visit Report     04/17/2016   --------------------------------------------------------------------------------   Carl [name]. Todaro  MRN: [id number]  PRIMARY CARE:  Meindert A. Brunetta Genera, MD  DOB: 04-Dec-1935, 80 year old Male  REFERRING:    SSN: [id number]  PROVIDER:  Rana Snare, M.D.    TREATING:  Jiles Crocker    LOCATION:  [facility] [phone]   --------------------------------------------------------------------------------   CC/HPI: This gentleman has a history of prostate cancer on androgen deprivation therapy as well as a significant bilateral stone burden and narrowing 2 cm stone within the urinary bladder. Last office visit he was recommended to undergo treatment of the bladder stone as this is thought to be the source of his microscopic hematuria and other bothersome lower urinary tract symptoms but patient did not want to undergo treatment at that time.    Since last evaluation, pt has had worsening LUTS most notably frequency, dysuria, and some bladder pain. He denies fevers or gross hematuria.     ALLERGIES: No Known Drug Allergies    MEDICATIONS: Bicalutamide 50 mg tablet TAKE 1 TABLET EVERY DAY  Aspirin 81 MG TABS 0 Oral  Atorvastatin Calcium 40 mg tablet  Carvedilol 12.5 MG Oral Tablet Oral  Fenofibrate 160 MG Oral Tablet 1 Oral Daily  Hydrocodone-Acetaminophen 5-325 MG Oral Tablet 1 tablet PO Every 6 hours  Lisinopril 5 MG Oral Tablet Oral  Lupron Depot 30 mg (4 month) syringe kit 30 mg IM Administered by: Anell Barr  Metoprolol Tartrate 50 MG Oral Tablet 2 Oral  Omeprazole 20 MG Oral Capsule Delayed Release 0 Oral  Tamsulosin HCl - 0.4 MG Oral Capsule 1 capsule PO Daily  Vitamin D TABS Oral     GU PSH: Cystoscopy - 04/07/2016 Hernia Repair Locm 300-399Mg/Ml Iodine,1Ml - 04/04/2016      PSH Notes: Heart Surgery, Inguinal Hernia Repair   NON-GU PSH: None   GU PMH: Bladder Stone - 04/07/2016 Kidney Stone - 04/07/2016 Asymptomatic  microscopic hematuria - 03/21/2016 Prostate Cancer, Prostate cancer - 11/12/2015 Recurrent Cystitis w/o hematuria, Chronic cystitis - 07/13/2015 Weak Urinary Stream, Weak urinary stream - 03/02/2015 Dysuria, Dysuria - 2014 Personal Hx urinary calculi, Nephrolithiasis - 2014 Prostate Cancer, History, Prostate Cancer - 2014      PMH Notes:  Past Gu Hx:    Mr. Brayboy returns for follow-up. Again, his situation is fairly complicated. He had been a long-standing patient of Dr. Caesar Bookman and we assumed his care in 2006.[name] He was originally diagnosed with what appeared to be favorable prostate cancer 18 years ago( 1997). He had a seed implantation which subsequently failed. His PSA began to rise and it appears that around 2004 he was started on hormonal therapy. His testosterone fell but not all the way to castrate levels. His PSA went down to a nadir level of 1.1 in October 2006. It was elected to do intermittent hormonal therapy and his PSA slowly increased. His testosterone remained on the low side. Primary care physician was unaware of his diagnosis of prostate cancer and he was put on testosterone. With that, his PSA went up really quite dramatically. It got as high as approximately 12.0. The patient's testosterone supplementation was subsequently discontinued, but his PSA continued to remain elevated. For that reason, we decided to go ahead and put him back on hormonal therapy. A bone scan was done in November in 2007 which was negative. He has been on Lupron therapy now since 09/2006. He still has occasional hot flashes but is doing pretty  well. No voiding complaints. No other new systemic complaints. He does like to take usually about 2 pain pills a day to help with some chronic back discomfort. PSA 06/2007 down to 1.6.    Recent PSA in June 2014 was actually down slightly at 3.1. There's been a slow increase over the last couple of years. 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 DEXA scan was repeated in July of 2014. The hip showed some very mild osteopenia and spine was completely normal.     NON-GU PMH: Encounter for general adult medical examination without abnormal findings, Encounter for preventive health examination - 07/13/2015    FAMILY HISTORY: 1 son - Runs in Family 2 daughters - Runs in Family Death - Mother, Father   SOCIAL HISTORY: Marital Status: Widowed Current Smoking Status: Patient does not smoke anymore. Has not smoked since 02/25/1966.  Does not drink anymore.  Drinks 1 caffeinated drink per day. Patient's occupation is/was Retired.     Notes: Former smoker   REVIEW OF SYSTEMS:    GU Review Male:   Patient reports frequent urination, hard to postpone urination, burning/ pain with urination, and get up at night to urinate. Patient denies leakage of urine, stream starts and stops, trouble starting your stream, have to strain to urinate , erection problems, and penile pain.  Gastrointestinal (Upper):   Patient denies nausea, vomiting, and indigestion/ heartburn.  Gastrointestinal (Lower):   Patient denies diarrhea and constipation.  Constitutional:   Patient denies fever, night sweats, weight loss, and fatigue.  Skin:   Patient denies skin rash/ lesion and itching.  Eyes:   Patient denies blurred vision and double vision.  Ears/ Nose/ Throat:   Patient denies sore throat and sinus problems.  Hematologic/Lymphatic:   Patient denies swollen glands and easy bruising.  Cardiovascular:   Patient denies leg swelling and chest pains.  Respiratory:   Patient denies cough and shortness of breath.  Endocrine:   Patient denies excessive thirst.  Musculoskeletal:   Patient reports back pain and joint pain.   Neurological:   Patient denies headaches and dizziness.  Psychologic:   Patient denies depression and anxiety.   VITAL SIGNS:      04/17/2016 10:38 AM  Weight 200 lb /  90.72 kg  Height 70 in / 177.8 cm  BP 111/64 mmHg  Pulse 79 /min  Temperature 97.4 F / 36 C  BMI 28.7 kg/m   MULTI-SYSTEM PHYSICAL EXAMINATION:    Constitutional: Well-nourished. No physical deformities. Normally developed. Good grooming.  Respiratory: No labored breathing, no use of accessory muscles.   Cardiovascular: Normal temperature, normal extremity pulses, no swelling, no varicosities.  Skin: No paleness, no jaundice, no cyanosis. No lesion, no ulcer, no rash.  Neurologic / Psychiatric: Oriented to time, oriented to place, oriented to person. No depression, no anxiety, no agitation.  Gastrointestinal: No mass, no tenderness, no rigidity, non obese abdomen.  Musculoskeletal: Spine, ribs, pelvis no bilateral tenderness. Normal gait and station of head and neck.     PAST DATA REVIEWED:  Source Of History:  Patient  Records Review:   Previous Patient Records  Urine Test Review:   Urinalysis  Urodynamics Review:   Review Bladder Scan   03/14/16 11/06/15 07/07/15 02/23/15 10/24/14 06/13/14 02/03/14 10/04/13  PSA  Total PSA 1.42  1.09  0.80  0.53  0.36  0.28  0.29  0.50     03/14/16 07/06/15  02/23/15 10/05/13 05/28/13 01/20/13 12/05/08 03/21/08  Hormones  Testosterone, Total 33.0 pg/dL 45  64  56  47  12  30.1  23.7     04/17/16  Urinalysis  Urine Appearance Clear   Urine Specimen Voided   Urine Color Yellow   Urine Glucose Neg   Urine Bilirubin Neg   Urine Ketones Neg   Urine Specific Gravity 1.025   Urine Blood 3+   Urine pH 5.5   Urine Protein 3+   Urine Urobilinogen 0.2   Urine Nitrites Neg   Urine Leukocyte Esterase 3+   Urine WBC/hpf >60/hpf   Urine RBC/hpf NS (Not Seen)   Urine Epithelial Cells NS (Not Seen)   Urine Bacteria Few (10-25/hpf)   Urine Mucous Present   Urine Yeast NS (Not Seen)   Urine Trichomonas Not Present   Urine Cystals NS (Not Seen)   Urine Casts NS (Not Seen)   Urine Sperm Not Present    PROCEDURES:           PVR Ultrasound -  87867  Scanned Volume: 25 cc         Urinalysis w/Scope - 81001 Dipstick Dipstick Cont'd Micro  Specimen: Voided Bilirubin: Neg WBC/hpf: >60/hpf  Color: Yellow Ketones: Neg RBC/hpf: NS (Not Seen)  Appearance: Clear Blood: 3+ Bacteria: Few (10-25/hpf)  Specific Gravity: 1.025 Protein: 3+ Cystals: NS (Not Seen)  pH: 5.5 Urobilinogen: 0.2 Casts: NS (Not Seen)  Glucose: Neg Nitrites: Neg Trichomonas: Not Present    Leukocyte Esterase: 3+ Mucous: Present      Epithelial Cells: NS (Not Seen)      Yeast: NS (Not Seen)      Sperm: Not Present    ASSESSMENT:      ICD-10 Details  1 GU:   Bladder Stone - N21.0    PLAN:           Orders Labs Urine Culture and Sensitivity            Schedule Return Visit: Next Available Appointment - Schedule Surgery, Follow up MD          Document Letter(s):  Created for Patient: Clinical Summary         Notes:   Pt now with new symptoms of increased frequency, bladder pain, and dysuria. He wishes to proceed with a definitive procedure to treat stone as previously recommended. I'm mildly suspicious for possible UTI. I will follow up with urine culture results and prescribe appropriate and a microbial therapy and also reach up to his urologist about having him set up for cystolitholapaxy.   Urine culture was positive for pseudomonas which was pansensitive.  He's been on several weeks now of ciprofloxacin.    * Signed by Jiles Crocker on 04/17/16 at 10:56 AM (EDT)*

## 2016-05-07 NOTE — Transfer of Care (Signed)
Immediate Anesthesia Transfer of Care Note  Patient: Carl [name]  Procedure(s) Performed: Procedure(s) (LRB): CYSTOSCOPY WITH LITHOLAPAXY (N/A) HOLMIUM LASER APPLICATION (N/A)  Patient Location: PACU  Anesthesia Type: General  Level of Consciousness: awake, sedated, patient cooperative and responds to stimulation  Airway & Oxygen Therapy: Patient Spontanous Breathing and Patient connected to face mask oxygen  Post-op Assessment: Report given to PACU RN, Post -op Vital signs reviewed and stable and Patient moving all extremities  Post vital signs: Reviewed and stable  Complications: No apparent anesthesia complications

## 2016-05-07 NOTE — Anesthesia Procedure Notes (Signed)
Procedure Name: LMA Insertion Date/Time: 05/07/2016 9:30 AM Performed by: Justice Rocher Pre-anesthesia Checklist: Patient identified, Emergency Drugs available, Suction available and Patient being monitored Patient Re-evaluated:Patient Re-evaluated prior to inductionOxygen Delivery Method: Circle system utilized Preoxygenation: Pre-oxygenation with 100% oxygen Intubation Type: IV induction Ventilation: Mask ventilation without difficulty LMA: LMA inserted LMA Size: 5.0 Number of attempts: 1 Airway Equipment and Method: Bite block Placement Confirmation: positive ETCO2 Tube secured with: Tape Dental Injury: Teeth and Oropharynx as per pre-operative assessment

## 2016-05-07 NOTE — Interval H&P Note (Signed)
History and Physical Interval Note:  05/07/2016 9:30 AM  Carl [name]  has presented today for surgery, with the diagnosis of BLADDER CALCULUS  The various methods of treatment have been discussed with the patient and family. After consideration of risks, benefits and other options for treatment, the patient has consented to  Procedure(s): CYSTOSCOPY WITH LITHOLAPAXY (N/A) HOLMIUM LASER APPLICATION (N/A) as a surgical intervention .  The patient's history has been reviewed, patient examined, no change in status, stable for surgery.  I have reviewed the patient's chart and labs.  Questions were answered to the patient's satisfaction.     Giankarlo Leamer S

## 2016-05-07 NOTE — Anesthesia Preprocedure Evaluation (Signed)
Anesthesia Evaluation  Patient identified by MRN, date of birth, ID band Patient awake    Reviewed: Allergy & Precautions, NPO status , Patient's Chart, lab work & pertinent test results, reviewed documented beta blocker date and time   History of Anesthesia Complications Negative for: history of anesthetic complications  Airway Mallampati: II  TM Distance: >3 FB Neck ROM: Full    Dental  (+) Dental Advisory Given, Partial Upper, Poor Dentition   Pulmonary former smoker,    Pulmonary exam normal        Cardiovascular hypertension, Pt. on medications and Pt. on home beta blockers + CAD and + CABG  Normal cardiovascular exam  01/16) ef 60-65%, mid-apical anteroseptal hypokinesis   Neuro/Psych PSYCHIATRIC DISORDERS Anxiety negative neurological ROS     GI/Hepatic Neg liver ROS, GERD  ,  Endo/Other    Renal/GU      Musculoskeletal   Abdominal   Peds  Hematology negative hematology ROS (+)   Anesthesia Other Findings   Reproductive/Obstetrics                             Anesthesia Physical Anesthesia Plan  ASA: III  Anesthesia Plan: General   Post-op Pain Management:    Induction: Intravenous  Airway Management Planned: LMA  Additional Equipment:   Intra-op Plan:   Post-operative Plan: Extubation in OR  Informed Consent: I have reviewed the patients History and Physical, chart, labs and discussed the procedure including the risks, benefits and alternatives for the proposed anesthesia with the patient or authorized representative who has indicated his/her understanding and acceptance.   Dental advisory given  Plan Discussed with: CRNA  Anesthesia Plan Comments:         Anesthesia Quick Evaluation

## 2016-05-08 ENCOUNTER — Encounter (HOSPITAL_BASED_OUTPATIENT_CLINIC_OR_DEPARTMENT_OTHER): Payer: Self-pay | Admitting: Urology

## 2016-09-04 ENCOUNTER — Ambulatory Visit (INDEPENDENT_AMBULATORY_CARE_PROVIDER_SITE_OTHER): Payer: Medicare Other | Admitting: Cardiology

## 2016-09-04 ENCOUNTER — Telehealth: Payer: Self-pay | Admitting: Pharmacist

## 2016-09-04 ENCOUNTER — Encounter: Payer: Self-pay | Admitting: Cardiology

## 2016-09-04 DIAGNOSIS — E785 Hyperlipidemia, unspecified: Secondary | ICD-10-CM

## 2016-09-04 DIAGNOSIS — I4891 Unspecified atrial fibrillation: Secondary | ICD-10-CM | POA: Diagnosis not present

## 2016-09-04 DIAGNOSIS — I4819 Other persistent atrial fibrillation: Secondary | ICD-10-CM | POA: Insufficient documentation

## 2016-09-04 DIAGNOSIS — C61 Malignant neoplasm of prostate: Secondary | ICD-10-CM | POA: Insufficient documentation

## 2016-09-04 DIAGNOSIS — Z8546 Personal history of malignant neoplasm of prostate: Secondary | ICD-10-CM

## 2016-09-04 DIAGNOSIS — I4811 Longstanding persistent atrial fibrillation: Secondary | ICD-10-CM | POA: Insufficient documentation

## 2016-09-04 DIAGNOSIS — I255 Ischemic cardiomyopathy: Secondary | ICD-10-CM

## 2016-09-04 DIAGNOSIS — Z951 Presence of aortocoronary bypass graft: Secondary | ICD-10-CM

## 2016-09-04 DIAGNOSIS — I5041 Acute combined systolic (congestive) and diastolic (congestive) heart failure: Secondary | ICD-10-CM

## 2016-09-04 DIAGNOSIS — I509 Heart failure, unspecified: Secondary | ICD-10-CM | POA: Insufficient documentation

## 2016-09-04 LAB — CBC
HCT: 34.2 % — ABNORMAL LOW (ref 38.5–50.0)
Hemoglobin: 10.7 g/dL — ABNORMAL LOW (ref 13.2–17.1)
MCH: 27.6 pg (ref 27.0–33.0)
MCHC: 31.3 g/dL — ABNORMAL LOW (ref 32.0–36.0)
MCV: 88.1 fL (ref 80.0–100.0)
MPV: 10.2 fL (ref 7.5–12.5)
Platelets: 151 10*3/uL (ref 140–400)
RBC: 3.88 MIL/uL — ABNORMAL LOW (ref 4.20–5.80)
RDW: 16.1 % — ABNORMAL HIGH (ref 11.0–15.0)
WBC: 5.4 10*3/uL (ref 3.8–10.8)

## 2016-09-04 LAB — COMPREHENSIVE METABOLIC PANEL
ALT: 23 U/L (ref 9–46)
AST: 27 U/L (ref 10–35)
Albumin: 3.6 g/dL (ref 3.6–5.1)
Alkaline Phosphatase: 90 U/L (ref 40–115)
BUN: 23 mg/dL (ref 7–25)
CO2: 27 mmol/L (ref 20–31)
Calcium: 8.7 mg/dL (ref 8.6–10.3)
Chloride: 109 mmol/L (ref 98–110)
Creat: 1.2 mg/dL — ABNORMAL HIGH (ref 0.70–1.18)
Glucose, Bld: 98 mg/dL (ref 65–99)
Potassium: 4.4 mmol/L (ref 3.5–5.3)
Sodium: 143 mmol/L (ref 135–146)
Total Bilirubin: 0.7 mg/dL (ref 0.2–1.2)
Total Protein: 6 g/dL — ABNORMAL LOW (ref 6.1–8.1)

## 2016-09-04 LAB — MAGNESIUM: Magnesium: 1.7 mg/dL (ref 1.5–2.5)

## 2016-09-04 LAB — TSH: TSH: 1.68 mIU/L (ref 0.40–4.50)

## 2016-09-04 MED ORDER — RIVAROXABAN 20 MG PO TABS: 20.0000 mg | ORAL_TABLET | Freq: Every day | ORAL | 5 refills | Status: DC

## 2016-09-04 NOTE — Assessment & Plan Note (Addendum)
LIMA-LAD, S-RI, SOM, S-PD/PL Myoview low risk 2009 No angina

## 2016-09-04 NOTE — Assessment & Plan Note (Signed)
Pt noted to be in AF with VR 80's- new for him, onset undetermined

## 2016-09-04 NOTE — Telephone Encounter (Signed)
Carl [name] from [place] called to report STAT labs have resulted. No critical values. She is faxing currently to [phone].   Will route to Triage and covering nurse for provider, [name], [name].

## 2016-09-04 NOTE — Telephone Encounter (Signed)
NOTED KILROY ALREADY RESULTED

## 2016-09-04 NOTE — Assessment & Plan Note (Signed)
Placed on Lasix at Urgent Care with some improvement but still volume overloaded on exam

## 2016-09-04 NOTE — Addendum Note (Signed)
Addended by: Waylan Rocher on: 09/04/2016 04:10 PM   Modules accepted: Orders

## 2016-09-04 NOTE — Assessment & Plan Note (Signed)
EF 45% in 2013- improved to 60-65% by echo Jan 2016

## 2016-09-04 NOTE — Patient Instructions (Signed)
Medication Instructions:  Your physician recommends that you continue on your current medications as directed. Please refer to the Current Medication list given to you today.  WE WILL CALL TO LET YOU KNOW IF YOU NEED TO START XARELTO 20MG  DAILY  If you need a refill on your cardiac medications before your next appointment, please call your pharmacy.  Labwork: CBC-STAT, CMET, MAG AND TSH TODAY AT [facility] ON THE 1ST FLOOR  Testing/Procedures: Your physician has requested that you have an echocardiogram. Echocardiography is a painless test that uses sound waves to create images of your heart. It provides your doctor with information about the size and shape of your heart and how well your heart's chambers and valves are working. This procedure takes approximately one hour. There are no restrictions for this procedure.   Follow-Up: WITH [name], PA-C AFTER ECHO   Thank you for choosing [facility] at [facility], [place], PA-C

## 2016-09-04 NOTE — Progress Notes (Signed)
09/04/2016 Carl [name]   10-13-1935  [id number]  Primary Physician Lorelee Market, MD Primary Cardiologist: Dr Aundra Dubin- (will be Dr Percival Spanish)  HPI:  81 y/o male from [place] who is here with his granddaughter. He has a history of CAD- s/p CABG x 5 in 2006. He has had no further issues from that standpoint. He has had a moderate CM in the past but his echo in Jan 2016 showed his EF was 60-65%. The pt went to an Urgent Care in [facility] yesterday with complaints of increased DOE and orthopnea x 2 weeks. He was felt to be in AF with CHF there. His rate is controlled-80's. He was placed on Lasix and referred here for further evaluation. He was unaware of his HR being irregular. He denies any chest pain. His dyspnea is better but not resolved.      Current Outpatient Prescriptions  Medication Sig Dispense Refill  . aspirin EC 81 MG tablet Take 1 tablet (81 mg total) by mouth daily. 30 tablet 6  . bicalutamide (CASODEX) 50 MG tablet Take 50 mg by mouth every morning.   5  . carvedilol (COREG) 12.5 MG tablet TAKE 1 TABLET (12.5 MG TOTAL) BY MOUTH 2 (TWO) TIMES DAILY. (Patient taking differently: Take 12.5 mg by mouth 2 (two) times daily with a meal. TAKE 1 TABLET (12.5 MG TOTAL) BY MOUTH 2 (TWO) TIMES DAILY.) 60 tablet 11  . furosemide (LASIX) 20 MG tablet Take 20 mg by mouth daily. 7 tablets    . omeprazole (PRILOSEC) 20 MG capsule TAKE ONE CAPSULE BY MOUTH EVERY DAY (Patient taking differently: TAKE ONE CAPSULE BY MOUTH EVERY DAY--  TAKES IN AM) 90 capsule 2  . tamsulosin (FLOMAX) 0.4 MG CAPS capsule Take 0.4 mg by mouth every morning.   11  . rosuvastatin (CRESTOR) 40 MG tablet Take 1 tablet (40 mg total) by mouth daily. (Patient taking differently: Take 40 mg by mouth every morning. ) 30 tablet 6   No current facility-administered medications for this visit.     No Known Allergies  Social History   Social History  . Marital status: Widowed    Spouse name: N/A  . Number of  children: N/A  . Years of education: N/A   Occupational History  . Not on file.   Social History Main Topics  . Smoking status: Former Smoker    Years: 2.00    Types: Cigarettes    Quit date: 07/28/1960  . Smokeless tobacco: Never Used  . Alcohol use No  . Drug use: No  . Sexual activity: No   Other Topics Concern  . Not on file   Social History Narrative  . No narrative on file     Review of Systems: General: negative for chills, fever, night sweats or weight changes.  Cardiovascular: negative for chest pain, or palpitations Dermatological: negative for rash Respiratory: negative for cough or wheezing Urologic: negative for hematuria Abdominal: negative for nausea, vomiting, diarrhea, bright red blood per rectum, melena, or hematemesis Neurologic: negative for visual changes, syncope, or dizziness All other systems reviewed and are otherwise negative except as noted above.    Blood pressure (!) 144/87, pulse 81, weight 199 lb 9.6 oz (90.5 kg).  General appearance: alert, cooperative and no distress Neck: no carotid bruit and no JVD Lungs: basilar rales Heart: irregularly irregular rhythm Abdomen: soft, non-tender; bowel sounds normal; no masses,  no organomegaly Extremities: 1-2+ pitting ankle edema Pulses: 2+ and symmetric Skin: Skin color, texture,  turgor normal. No rashes or lesions or pale, cool, dry Neurologic: Grossly normal  EKG AF with VR 81  ASSESSMENT AND PLAN:   Atrial fibrillation, new onset ([place]) Pt noted to be in AF with VR 80's- new for him, onset undetermined  Acute congestive heart failure ([place]) Placed on Lasix at Urgent Care with some improvement but still volume overloaded on exam  Cardiomyopathy, ischemic EF 45% in 2013- improved to 60-65% by echo Jan 2016  Hx of CABG x 5 '06 LIMA-LAD, S-RI, [facility], S-PD/PL Myoview low risk 2009 No angina   PLAN  The pt is in new AF with CVR. He has CHF that is improving with diuretics. He looks pale to  me- he denies any bleeding history or PUD. He denies any melana. He is a CHADs VASc=5 for age, HTN, CHF, and vascular disease.  I reviewed his case with Hochrein in the office today-plan is for labs, (none drawn at Urgent Care) -including stool guaiac cards-  and an echo next week. He'll continue the Lasix as ordered. We provided samples of Xarelto 20 mg but will wait for his CBC before contacting him to start this.   [name] PA-C 09/04/2016 10:41 AM

## 2016-09-05 ENCOUNTER — Telehealth: Payer: Self-pay | Admitting: Cardiology

## 2016-09-05 DIAGNOSIS — D649 Anemia, unspecified: Secondary | ICD-10-CM

## 2016-09-05 DIAGNOSIS — K922 Gastrointestinal hemorrhage, unspecified: Secondary | ICD-10-CM

## 2016-09-05 NOTE — Telephone Encounter (Signed)
Discussed this case w Isaac Laud - per his instruction, pt needs to be confirmed hemocult negative before starting Xarelto. Also noted that wrong lab was ordered - pt will need stool guiac, not culture. I will contact [facility] to inform and submit new orders.

## 2016-09-05 NOTE — Telephone Encounter (Signed)
Spoke w [employer] rep, who advised that since the patient had given stool sample in a culture vial, no other tests can be run on this sample (would be compromised by vial solution). Will need patient to get this recollected in a sterile cup. The order would be for "fecal occult blood, immunochemical".  Verified w other RN and order placed for this test.  I've given patient's granddaughter instructions to hold Xarelto for now, apologized for error but requested she get sterile cup from [facility] for new fecal sample collection. She voiced understanding and thanks. She will go to [facility] [place] today (open until 5pm) or tomorrow (open at 8am) to get specimen cup, and reacquire.  Routed to provider as Juluis Rainier. Please advise further if needed.

## 2016-09-05 NOTE — Telephone Encounter (Signed)
Former pt of Dr. Aundra Dubin, seen urgently by [facility] yesterday and who will establish care w Dr. Percival Spanish.  Hx of CHF Pale on assessment yesterday, had new dx of A Fib, Luke indicated he would start Xarelto pending stable bloodwork. Labs including CBC and stool study ordered  I spoke w daughter. She is on the way to [facility] to drop off stool sample today. Relays that patient saw possible blood in stool sample, but he's unsure.  Aware that Xarelto initiation was pending labwork - daughter informed me patient received call yesterday from nurse indicating OK to start. I don't see Luke's notes on lab results so am unable to verify.  Patient denies new symptoms.  Routed to Dr. Sallyanne Kuster DoD  Can you review labs? Advise if he should hold Xarelto or continue?

## 2016-09-05 NOTE — Telephone Encounter (Signed)
Pt was started on a blood thinner yesterday.2 or 3 hrs after pt took the blood thinner he saw blood in his stool. Should pt continue taking the blood thinner?

## 2016-09-09 ENCOUNTER — Encounter: Payer: Self-pay | Admitting: Nurse Practitioner

## 2016-09-09 LAB — FECAL OCCULT BLOOD, IMMUNOCHEMICAL: Fecal Occult Blood: POSITIVE — AB

## 2016-09-09 NOTE — Telephone Encounter (Signed)
noted 

## 2016-09-09 NOTE — Telephone Encounter (Signed)
Discussed w Dr. Percival Spanish, explained situation w lapse in primary care - he advised OK to submit GI referral rather than refer back to PCP.  Discussed w granddaughter. She voiced understanding of plan and expressed thanks for helping w arrangements. Understands to call if she does not hear back from [facility] about evaluation.

## 2016-09-09 NOTE — Telephone Encounter (Signed)
Unable to reach patient, have contacted granddaughter to get information. She states pt does not have a PCP, she's been trying to set him up with someone, but he lapsed into "new patient" status after not seeing his historical PCP, also now they will not accept his current insurance. She's trying to get him in w a new provider, but states they are being waitlisted and not able to see anyone for a few weeks. Needing advice on what to do, if urgent care, GI referral, etc advised.  Informed granddaughter I would seek recommendations from MD and return call.

## 2016-09-09 NOTE — Telephone Encounter (Signed)
-----   Message from Minus Breeding, MD sent at 09/09/2016 12:38 PM EST ----- Please confirm that this patient has an appt with his PCP to discuss work up of the guaiac positive stool.

## 2016-09-10 LAB — STOOL CULTURE

## 2016-09-15 ENCOUNTER — Telehealth: Payer: Self-pay | Admitting: Cardiology

## 2016-09-15 ENCOUNTER — Encounter: Payer: Self-pay | Admitting: *Deleted

## 2016-09-15 NOTE — Telephone Encounter (Addendum)
Received records from [facility] for appointment on 09/19/16 with [name], [place].  Records put with Luke's schedule for 09/19/16. lp

## 2016-09-16 ENCOUNTER — Ambulatory Visit (HOSPITAL_COMMUNITY): Payer: Medicare Other | Attending: Cardiovascular Disease

## 2016-09-16 ENCOUNTER — Other Ambulatory Visit: Payer: Self-pay

## 2016-09-16 DIAGNOSIS — I4891 Unspecified atrial fibrillation: Secondary | ICD-10-CM

## 2016-09-16 DIAGNOSIS — I517 Cardiomegaly: Secondary | ICD-10-CM

## 2016-09-16 DIAGNOSIS — I081 Rheumatic disorders of both mitral and tricuspid valves: Secondary | ICD-10-CM | POA: Insufficient documentation

## 2016-09-16 HISTORY — DX: Cardiomegaly: I51.7

## 2016-09-17 ENCOUNTER — Encounter: Payer: Self-pay | Admitting: Nurse Practitioner

## 2016-09-17 ENCOUNTER — Ambulatory Visit (INDEPENDENT_AMBULATORY_CARE_PROVIDER_SITE_OTHER): Payer: Medicare Other | Admitting: Nurse Practitioner

## 2016-09-17 ENCOUNTER — Other Ambulatory Visit (INDEPENDENT_AMBULATORY_CARE_PROVIDER_SITE_OTHER): Payer: Medicare Other

## 2016-09-17 VITALS — BP 118/70 | HR 72 | Ht 71.0 in | Wt 200.8 lb

## 2016-09-17 DIAGNOSIS — R195 Other fecal abnormalities: Secondary | ICD-10-CM

## 2016-09-17 DIAGNOSIS — D649 Anemia, unspecified: Secondary | ICD-10-CM | POA: Diagnosis not present

## 2016-09-17 LAB — CBC WITH DIFFERENTIAL/PLATELET
Basophils Absolute: 0 10*3/uL (ref 0.0–0.1)
Basophils Relative: 0.7 % (ref 0.0–3.0)
Eosinophils Absolute: 0.2 10*3/uL (ref 0.0–0.7)
Eosinophils Relative: 3 % (ref 0.0–5.0)
HCT: 32.5 % — ABNORMAL LOW (ref 39.0–52.0)
Hemoglobin: 10.3 g/dL — ABNORMAL LOW (ref 13.0–17.0)
Lymphocytes Relative: 23.6 % (ref 12.0–46.0)
Lymphs Abs: 1.3 10*3/uL (ref 0.7–4.0)
MCHC: 31.8 g/dL (ref 30.0–36.0)
MCV: 87.8 fl (ref 78.0–100.0)
Monocytes Absolute: 0.5 10*3/uL (ref 0.1–1.0)
Monocytes Relative: 8.9 % (ref 3.0–12.0)
Neutro Abs: 3.5 10*3/uL (ref 1.4–7.7)
Neutrophils Relative %: 63.8 % (ref 43.0–77.0)
Platelets: 138 10*3/uL — ABNORMAL LOW (ref 150.0–400.0)
RBC: 3.7 Mil/uL — ABNORMAL LOW (ref 4.22–5.81)
RDW: 17.1 % — ABNORMAL HIGH (ref 11.5–15.5)
WBC: 5.4 10*3/uL (ref 4.0–10.5)

## 2016-09-17 NOTE — Patient Instructions (Addendum)
Your physician has requested that you go to the basement for the following lab work before leaving today: CBC  If you are age 79 or older, your body mass index should be between 23-30. Your Body mass index is 28.01 kg/m. If this is out of the aforementioned range listed, please consider follow up with your Primary Care Provider.  If you are age 72 or younger, your body mass index should be between 19-25. Your Body mass index is 28.01 kg/m. If this is out of the aformentioned range listed, please consider follow up with your Primary Care Provider.

## 2016-09-17 NOTE — Progress Notes (Signed)
HPI: Patient is a 81 year old male referred by [name], P.A. [facility] for evaluation of anemia. He has a history of CAD, status post CABG in 2006. He has cardiomyopathy, last echo done January 2016 showed ejection fraction of 123456, grade 1 diastolic failure. He had an echo yesterday, results are pending. Patient recently seen at urgent care with complaints of dyspnea and orthopnea, diagnosed with new AFib. Evaluated by cardiology on 09/05/15, started on Xarelto but it was placed on hold after a few doses because of FOBT+. After starting Xarelto patient saw a speck of blood with one BM. In October his hgb was 11.9, now at 10.7. MCV is normal. Patient doesn't take any NSAIDs. No abdominal pain, nausea, bowel changes, or weight loss.   He continues to complain of SOB, especially with exertion. No chest pain or dizziness. No leg swelling. No cough. Patient is accompanied by his Granddaughter.    Past Medical History:  Diagnosis Date  . Anemia   . Arthritis    arms  . Bladder calculus   . Chronic anxiety   . Coronary artery disease CARDIOLOGIST-  DR Loralie Champagne   04/04/2005 -- CABG x5  . GERD (gastroesophageal reflux disease)   . History of non-ST elevation myocardial infarction (NSTEMI) 03/30/2005   immediate post-op chest and sob 03-30-2005 Left inguinal hernia repair  . History of prostate cancer urologist-  dr Risa Grill   hx radioactive prostate seed implants 1997--  currently on Lupron injection  . Hyperlipidemia   . Hyperplasia of prostate without lower urinary tract symptoms (LUTS)   . Hypertension   . Ischemic cardiomyopathy    echo (01/13) 45-50% septal akinesis, mild MR/  echo  (01/16) ef 60-65%, mid-apical anteroseptal hypokinesis  . PAC (premature atrial contraction)   . S/P CABG x 5 04/04/2005   LIMA to LAD,  SVG to Ramus,  SVG to OM,  seqSVG to PLV and PDA  . Wears glasses      Past Surgical History:  Procedure Laterality Date  . CARDIAC CATHETERIZATION   04/02/2005   dr Doreatha Lew    Critical three-vessel coronary disease --  Essentially normal left ventricular function , ef 55%  . CARDIOVASCULAR STRESS TEST  05-01-2008  dr Doreatha Lew /  dr harding   Low risk nuclear study w/ no ischemia or scar but flattened septal motion/  ef 64%  . CORONARY ARTERY BYPASS GRAFT  04/04/2005   dr  Ricard Dillon   LIMA - LAD,  SVG - Ramus, SVG - OM,  seqSVG - PLV and PDA  . CYSTOSCOPY WITH LITHOLAPAXY N/A 05/07/2016   Procedure: CYSTOSCOPY WITH LITHOLAPAXY;  Surgeon: Rana Snare, MD;  Location: [facility];  Service: Urology;  Laterality: N/A;  . HOLMIUM LASER APPLICATION N/A [id number]   Procedure: HOLMIUM LASER APPLICATION;  Surgeon: Rana Snare, MD;  Location: [facility];  Service: Urology;  Laterality: N/A;  . INGUINAL HERNIA REPAIR Left 03-30-2005  dr Excell Seltzer   incarcerated   . RADIOACTIVE PROSTATE SEED IMPLANTS  1997  . TRANSTHORACIC ECHOCARDIOGRAM  08/24/2014   mid anteroseptal and distal septak hypokinetic,  mild focal basal LVH, ef 60-65%,  grade 1 diastolic dysfunction/  mild MR/  mild LAE/  mild dilated RV with normal RVSP/  trivial TR   Family History  Problem Relation Age of Onset  . Heart disease Neg Hx    Social History  Substance Use Topics  . Smoking status: Former Smoker    Years: 2.00  Types: Cigarettes    Quit date: 07/28/1960  . Smokeless tobacco: Never Used  . Alcohol use No   Current Outpatient Prescriptions  Medication Sig Dispense Refill  . aspirin EC 81 MG tablet Take 1 tablet (81 mg total) by mouth daily. 30 tablet 6  . bicalutamide (CASODEX) 50 MG tablet Take 50 mg by mouth every morning.   5  . carvedilol (COREG) 12.5 MG tablet TAKE 1 TABLET (12.5 MG TOTAL) BY MOUTH 2 (TWO) TIMES DAILY. (Patient taking differently: Take 12.5 mg by mouth 2 (two) times daily with a meal. TAKE 1 TABLET (12.5 MG TOTAL) BY MOUTH 2 (TWO) TIMES DAILY.) 60 tablet 11  . omeprazole (PRILOSEC) 20 MG capsule TAKE ONE CAPSULE BY  MOUTH EVERY DAY (Patient taking differently: TAKE ONE CAPSULE BY MOUTH EVERY DAY--  TAKES IN AM) 90 capsule 2  . rivaroxaban (XARELTO) 20 MG TABS tablet Take 1 tablet (20 mg total) by mouth daily with supper. 30 tablet 5  . rosuvastatin (CRESTOR) 40 MG tablet Take 1 tablet (40 mg total) by mouth daily. (Patient taking differently: Take 40 mg by mouth every morning. ) 30 tablet 6  . tamsulosin (FLOMAX) 0.4 MG CAPS capsule Take 0.4 mg by mouth every morning.   11  . furosemide (LASIX) 20 MG tablet Take 20 mg by mouth daily. 7 tablets     No current facility-administered medications for this visit.    No Known Allergies   Review of Systems: All systems reviewed and negative except where noted in HPI.    Physical Exam: BP 118/70 (BP Location: Left Arm, Patient Position: Sitting, Cuff Size: Normal)   Pulse 72   Ht 5\' 11"  (1.803 m)   Wt 200 lb 12.8 oz (91.1 kg)   BMI 28.01 kg/m  Constitutional:  Well-developed, white male in no acute distress. Psychiatric: Normal mood and affect. Behavior is normal. EENT:  Pupils equal. Conjunctivae are normal. No scleral icterus. Neck supple.  Cardiovascular: Normal rate, irregular rhythm, + murmur Pulmonary/chest: Effort normal and breath sounds normal. No wheezing, rales or rhonchi. Abdominal: Soft, nondistended, nontender. Bowel sounds active throughout. There are no masses palpable. No hepatomegaly. Extremities: no edema Lymphadenopathy: No cervical adenopathy noted. Neurological: Alert and oriented to person place and time. Skin: Skin is warm and dry. No rashes noted.   ASSESSMENT AND PLAN:  28. 81 year old male with normocytic anemia, FOBT+. Patient recently diagnosed with  AFib and plan is to start Xarelto but Cardiology wanted GI workup beforehand. We discussed colonoscopy but patient seems weak and still having intermittent SOB. I am concerned about his ability to undergo a colonoscopy right now. He appeared pale today, I recheck a CBC and  hgb is down a little more but overall stable at 10.3 ( a 2 gram drop since mid May) -His echocardiogram from yesterday is pending. At this point would recommend starting Xarelto if necessary. If he has overt bleeding then will certainly proceed with a colonoscopy. Hopefully in the interim his dyspnea will improve  2. CAD / CABG / cardiomopathy/ new Afib, rate controlled, CHADs VASc=5 for age.    [name], NP  09/17/2016, 10:14 AM  Cc:  [name], PA-C

## 2016-09-18 ENCOUNTER — Telehealth: Payer: Self-pay | Admitting: Nurse Practitioner

## 2016-09-18 NOTE — Telephone Encounter (Signed)
Advised of the hgb result and understands there will be recommendations once the labs are reviewed by the provider.

## 2016-09-18 NOTE — Progress Notes (Signed)
I agree with the above note, plan 

## 2016-09-19 ENCOUNTER — Ambulatory Visit (INDEPENDENT_AMBULATORY_CARE_PROVIDER_SITE_OTHER): Payer: Medicare Other | Admitting: Cardiology

## 2016-09-19 ENCOUNTER — Encounter: Payer: Self-pay | Admitting: Cardiology

## 2016-09-19 VITALS — BP 144/92 | HR 92 | Ht 71.0 in | Wt 201.8 lb

## 2016-09-19 DIAGNOSIS — E785 Hyperlipidemia, unspecified: Secondary | ICD-10-CM | POA: Diagnosis not present

## 2016-09-19 DIAGNOSIS — K921 Melena: Secondary | ICD-10-CM | POA: Insufficient documentation

## 2016-09-19 DIAGNOSIS — I4891 Unspecified atrial fibrillation: Secondary | ICD-10-CM

## 2016-09-19 DIAGNOSIS — R011 Cardiac murmur, unspecified: Secondary | ICD-10-CM

## 2016-09-19 DIAGNOSIS — I10 Essential (primary) hypertension: Secondary | ICD-10-CM

## 2016-09-19 DIAGNOSIS — N182 Chronic kidney disease, stage 2 (mild): Secondary | ICD-10-CM

## 2016-09-19 DIAGNOSIS — Z951 Presence of aortocoronary bypass graft: Secondary | ICD-10-CM

## 2016-09-19 DIAGNOSIS — I255 Ischemic cardiomyopathy: Secondary | ICD-10-CM | POA: Diagnosis not present

## 2016-09-19 DIAGNOSIS — Z7901 Long term (current) use of anticoagulants: Secondary | ICD-10-CM | POA: Diagnosis not present

## 2016-09-19 DIAGNOSIS — I5041 Acute combined systolic (congestive) and diastolic (congestive) heart failure: Secondary | ICD-10-CM

## 2016-09-19 MED ORDER — APIXABAN 5 MG PO TABS: 5.0000 mg | ORAL_TABLET | Freq: Two times a day (BID) | ORAL | 11 refills | Status: DC

## 2016-09-19 MED ORDER — FUROSEMIDE 20 MG PO TABS: 20.0000 mg | ORAL_TABLET | Freq: Every day | ORAL | 11 refills | Status: DC

## 2016-09-19 NOTE — Assessment & Plan Note (Signed)
Borderline control. 

## 2016-09-19 NOTE — Progress Notes (Addendum)
09/19/2016 Carl [name]   12/06/1935  [id number]  Primary Physician Lorelee Market, MD Primary Cardiologist: Was Dr Aundra Dubin- pt and family have requested a cardiologist at the [facility] office.   HPI:  81 y/o male from [place] who was seen 09/04/16 in the office at [facility]. He is a former cardiology patient of Dr Aundra Dubin and was to transition to Dr Percival Spanish (though Dr Percival Spanish has never seen him). He has a history of CAD- s/p CABG x 5 in 2006. He has had no further issues from that standpoint. He has had a moderate CM in the past but his echo in Jan 2016 showed his EF was 60-65%. The pt went to an Urgent Care in [facility] 09/03/16 with complaints of increased DOE and orthopnea x 2 weeks. He was felt to be in AF with CHF there. His rate was controlled-80's. He was placed on Lasix 20 mg x 7 days and referred here for further evaluation. He was unaware of his HR being irregular. He denies any chest pain. His dyspnea improved on the Lasix but did not completely resolve.   When I saw him in the office he looked pale. He denied any history of GI bleeding or PUD. The plan was to start him on Xarelto (CHADsVASc=5) if his labs including stool hemoccults were OK.  His Hgb was stable but his hemoccults were positive and the pt reported red blood in his stool. He was referred to GI (Dr Ardis Hughs and [name] saw him 09/17/16). They felt he had only minor bleeding and suggested resuming anticoagulation. If he has recurrent bleeding we'll need to consider colonoscopy.   The pt is in the office today with his granddaughter for follow up. His 7 days of Lasix are up and he feels a little more SOB. He remains in AF with CVR.     Current Outpatient Prescriptions  Medication Sig Dispense Refill  . aspirin EC 81 MG tablet Take 1 tablet (81 mg total) by mouth daily. 30 tablet 6  . bicalutamide (CASODEX) 50 MG tablet Take 50 mg by mouth every morning.   5  . carvedilol (COREG) 12.5 MG tablet TAKE 1  TABLET (12.5 MG TOTAL) BY MOUTH 2 (TWO) TIMES DAILY. (Patient taking differently: Take 12.5 mg by mouth 2 (two) times daily with a meal. TAKE 1 TABLET (12.5 MG TOTAL) BY MOUTH 2 (TWO) TIMES DAILY.) 60 tablet 11  . furosemide (LASIX) 20 MG tablet Take 1 tablet (20 mg total) by mouth daily. 7 tablets 30 tablet 11  . omeprazole (PRILOSEC) 20 MG capsule TAKE ONE CAPSULE BY MOUTH EVERY DAY (Patient taking differently: TAKE ONE CAPSULE BY MOUTH EVERY DAY--  TAKES IN AM) 90 capsule 2  . rosuvastatin (CRESTOR) 40 MG tablet Take 1 tablet (40 mg total) by mouth daily. 30 tablet 6  . tamsulosin (FLOMAX) 0.4 MG CAPS capsule Take 0.4 mg by mouth every morning.   11  . apixaban (ELIQUIS) 5 MG TABS tablet Take 1 tablet (5 mg total) by mouth 2 (two) times daily. 60 tablet 11   No current facility-administered medications for this visit.     No Known Allergies  Social History   Social History  . Marital status: Widowed    Spouse name: N/A  . Number of children: N/A  . Years of education: N/A   Occupational History  . Not on file.   Social History Main Topics  . Smoking status: Former Smoker    Years: 2.00  Types: Cigarettes    Quit date: 07/28/1960  . Smokeless tobacco: Never Used  . Alcohol use No  . Drug use: No  . Sexual activity: No   Other Topics Concern  . Not on file   Social History Narrative  . No narrative on file     Review of Systems: General: negative for chills, fever, night sweats or weight changes.  Cardiovascular: negative for chest pain, dyspnea on exertion, edema, orthopnea, palpitations, paroxysmal nocturnal dyspnea  Dermatological: negative for rash Respiratory: negative for cough or wheezing Urologic: negative for hematuria Abdominal: negative for nausea, vomiting, diarrhea, bright red blood per rectum, melena, or hematemesis Neurologic: negative for visual changes, syncope, or dizziness All other systems reviewed and are otherwise negative except as noted  above.    Blood pressure (!) 144/92, pulse 92, height 5\' 11"  (1.803 m), weight 201 lb 12.8 oz (91.5 kg), SpO2 99 %.  General appearance: alert, cooperative, appears older than stated age and no distress Neck: no carotid bruit and no JVD Lungs: dereased at bases Rt > Lt with scattered rhonchi Heart: irregularly irregular rhythm and 2/6 MR murmur Extremities: trace edema Skin: Pale, cool, dry Neurologic: Grossly normal  EKG AF with VR  ASSESSMENT AND PLAN:   Acute congestive heart failure (HCC) CHF suspected to be secondary to new AF. He improved with diuretics but he is out now.  Atrial fibrillation, new onset ([place]) Pt noted to be in AF with VR 80's- new for him, onset undetermined  Cardiomyopathy, ischemic EF 45% in 2013- improved to 60-65% by echo Jan 2016 Repeat echo done 09/16/16 is pending  Hx of CABG x 5 '06 LIMA-LAD, S-RI, [facility], S-PD/PL Myoview low risk 2009  Murmur MR murmur on exam  Essential hypertension Borderline control  Dyslipidemia LDL 48 July 2017 on statin Rx  CKD (chronic kidney disease) stage 2, GFR 60-89 ml/min Last SCr1.2  Blood in stool GI evaluation-09/17/16. He is not significantly anemic and blood in stool suspected to be minor hemorrhoidal bleeding.   Anticoagulated Will try Eliquis (family requested something other than Xarelto)   PLAN  The pt and his family made it clear they would prefer to be followed at the [facility] office. They are very appreciative of our care but I think its a convenience issue for them. As its unlikely I could get him established there with a cardiologist in the next 2-3 weeks, and I think he should be cardioverted sooner rather than later, (after 4 weeks of anticoagulation), I will arrange for him to be seen in the AF clinic to arrange this. I resumed his Lasix 20 mg daily.   After his cardioversion a primary cardiologist either at [facility], (or possible [place] ?)  Can be arranged.   [name]  PA-C 09/19/2016 10:15 AM

## 2016-09-19 NOTE — Progress Notes (Signed)
Thanks! Owe you one  [employer]

## 2016-09-19 NOTE — Assessment & Plan Note (Signed)
Last SCr1.2

## 2016-09-19 NOTE — Patient Instructions (Signed)
Medication Instructions:  START LASIX 20MG  BID START ELIQUIS 5MG  BID  If you need a refill on your cardiac medications before your next appointment, please call your pharmacy.    Follow-Up: Your physician recommends that you schedule a follow-up appointment in: [facility]   Thank you for choosing [facility] at [facility]!!    Kerby Moors, LPN

## 2016-09-19 NOTE — Assessment & Plan Note (Signed)
Pt noted to be in AF with VR 80's- new for him, onset undetermined

## 2016-09-19 NOTE — Assessment & Plan Note (Signed)
EF 45% in 2013- improved to 60-65% by echo Jan 2016 Repeat echo done 09/16/16 is pending

## 2016-09-19 NOTE — Assessment & Plan Note (Signed)
CHF suspected to be secondary to new AF. He improved with diuretics but he is out now.

## 2016-09-19 NOTE — Assessment & Plan Note (Signed)
Duane [place], [facility], S-PD/PL Myoview low risk 2009

## 2016-09-19 NOTE — Assessment & Plan Note (Signed)
LDL 48 July 2017 on statin Rx

## 2016-09-19 NOTE — Assessment & Plan Note (Signed)
MR murmur on exam

## 2016-09-19 NOTE — Assessment & Plan Note (Signed)
GI evaluation-09/17/16. He is not significantly anemic and blood in stool suspected to be minor hemorrhoidal bleeding.

## 2016-09-19 NOTE — Assessment & Plan Note (Signed)
Will try Eliquis (family requested something other than Xarelto)

## 2016-09-23 LAB — ECHOCARDIOGRAM COMPLETE
Ao-asc: 34 cm
E decel time: 246 msec
FS: 33 % (ref 28–44)
IVS/LV PW RATIO, ED: 1.03
LA ID, A-P, ES: 53 mm
LA diam end sys: 53 mm
LA diam index: 2.54 cm/m2
LA vol A4C: 86 ml
LA vol index: 53.6 mL/m2
LA vol: 112 mL
LV PW d: 14.1 mm — AB (ref 0.6–1.1)
LVOT SV: 53 mL
LVOT VTI: 15.4 cm
LVOT area: 3.46 cm2
LVOT diameter: 21 mm
LVOT peak grad rest: 3 mmHg
LVOT peak vel: 88.7 cm/s
MV Dec: 246
MV Peak grad: 5 mmHg
MV pk E vel: 114 m/s
RV sys press: 62 mmHg
Reg peak vel: 368 cm/s
TR max vel: 368 cm/s

## 2016-09-29 ENCOUNTER — Encounter: Payer: Medicare Other | Admitting: Gastroenterology

## 2016-10-10 ENCOUNTER — Ambulatory Visit (HOSPITAL_COMMUNITY)
Admission: RE | Admit: 2016-10-10 | Discharge: 2016-10-10 | Disposition: A | Discharge status: Home / Self Care | Payer: Medicare Other | Source: Ambulatory Visit | Attending: Nurse Practitioner | Admitting: Nurse Practitioner

## 2016-10-10 ENCOUNTER — Encounter (HOSPITAL_COMMUNITY): Payer: Self-pay | Admitting: Nurse Practitioner

## 2016-10-10 VITALS — BP 160/86 | HR 82 | Ht 71.0 in | Wt 194.0 lb

## 2016-10-10 DIAGNOSIS — Z951 Presence of aortocoronary bypass graft: Secondary | ICD-10-CM | POA: Diagnosis not present

## 2016-10-10 DIAGNOSIS — Z9889 Other specified postprocedural states: Secondary | ICD-10-CM | POA: Diagnosis not present

## 2016-10-10 DIAGNOSIS — Z8249 Family history of ischemic heart disease and other diseases of the circulatory system: Secondary | ICD-10-CM | POA: Diagnosis not present

## 2016-10-10 DIAGNOSIS — K219 Gastro-esophageal reflux disease without esophagitis: Secondary | ICD-10-CM | POA: Insufficient documentation

## 2016-10-10 DIAGNOSIS — Z8546 Personal history of malignant neoplasm of prostate: Secondary | ICD-10-CM | POA: Diagnosis not present

## 2016-10-10 DIAGNOSIS — I491 Atrial premature depolarization: Secondary | ICD-10-CM | POA: Diagnosis not present

## 2016-10-10 DIAGNOSIS — E785 Hyperlipidemia, unspecified: Secondary | ICD-10-CM | POA: Insufficient documentation

## 2016-10-10 DIAGNOSIS — I2510 Atherosclerotic heart disease of native coronary artery without angina pectoris: Secondary | ICD-10-CM | POA: Diagnosis not present

## 2016-10-10 DIAGNOSIS — I252 Old myocardial infarction: Secondary | ICD-10-CM | POA: Diagnosis not present

## 2016-10-10 DIAGNOSIS — I10 Essential (primary) hypertension: Secondary | ICD-10-CM | POA: Diagnosis not present

## 2016-10-10 DIAGNOSIS — I4891 Unspecified atrial fibrillation: Secondary | ICD-10-CM

## 2016-10-10 DIAGNOSIS — Z79899 Other long term (current) drug therapy: Secondary | ICD-10-CM | POA: Insufficient documentation

## 2016-10-10 DIAGNOSIS — N400 Enlarged prostate without lower urinary tract symptoms: Secondary | ICD-10-CM | POA: Diagnosis not present

## 2016-10-10 DIAGNOSIS — Z87891 Personal history of nicotine dependence: Secondary | ICD-10-CM | POA: Insufficient documentation

## 2016-10-10 DIAGNOSIS — I255 Ischemic cardiomyopathy: Secondary | ICD-10-CM | POA: Diagnosis not present

## 2016-10-10 DIAGNOSIS — Z7901 Long term (current) use of anticoagulants: Secondary | ICD-10-CM | POA: Insufficient documentation

## 2016-10-10 LAB — BASIC METABOLIC PANEL
Anion gap: 6 (ref 5–15)
BUN: 14 mg/dL (ref 6–20)
CO2: 23 mmol/L (ref 22–32)
Calcium: 8.8 mg/dL — ABNORMAL LOW (ref 8.9–10.3)
Chloride: 108 mmol/L (ref 101–111)
Creatinine, Ser: 1.05 mg/dL (ref 0.61–1.24)
GFR calc Af Amer: >60 mL/min (ref >60)
GFR calc non Af Amer: >60 mL/min (ref >60)
Glucose, Bld: 107 mg/dL — ABNORMAL HIGH (ref 65–99)
Potassium: 4.3 mmol/L (ref 3.5–5.1)
Sodium: 137 mmol/L (ref 135–145)

## 2016-10-10 LAB — CBC
HCT: 30.9 % — ABNORMAL LOW (ref 39.0–52.0)
Hemoglobin: 9.5 g/dL — ABNORMAL LOW (ref 13.0–17.0)
MCH: 27 pg (ref 26.0–34.0)
MCHC: 30.7 g/dL (ref 30.0–36.0)
MCV: 87.8 fL (ref 78.0–100.0)
Platelets: 160 10*3/uL (ref 150–400)
RBC: 3.52 MIL/uL — ABNORMAL LOW (ref 4.22–5.81)
RDW: 16.2 % — ABNORMAL HIGH (ref 11.5–15.5)
WBC: 5.5 10*3/uL (ref 4.0–10.5)

## 2016-10-10 NOTE — Patient Instructions (Addendum)
Your physician has recommended you make the following change in your medication:  1)Stop Aspirin  Cardioversion scheduled for Tuesday, March 20th  - Arrive at the [facility] and go to admitting at 9:30am  -Do not eat or drink anything after midnight the night prior to your procedure.  - Take all your medication with a sip of water prior to arrival.  - You will not be able to drive home after your procedure.

## 2016-10-13 NOTE — Progress Notes (Addendum)
Primary Care Physician: Lorelee Market, MD Referring Physician:Luke Ackerman, [name]   Carl [name] is a 81 y.o. male with a h/o CAD- s/p CABG x 5 in 2006. He has had no further issues from that standpoint. He has had a moderate CM in the past but his echo in Jan 2016 showed his EF was 60-65%. The pt went to an Urgent Care in [facility] 09/03/16 with complaints of increased DOE and orthopnea x 2 weeks. He was felt to be in AF with CHF.[name] His rate was controlled-80's. He was placed on Lasix 20 mg x 7 days and referred to [name], [name] for further evaluation. He was unaware of his HR being irregular. His dyspnea improved on the Lasix but did not completely resolve. He had been a prior pt of Dr. Aundra Dubin and when he saw [name], requested a MD at [facility] office instead of [facility] due to the further distance for [employer]. [name] thought he needed cardioversion when he satisfied the 3 weeks loading of DOAC. He went on to c/o of some blood in the stool with a drop in HGB, [name] was concerned with his paleness and was referred to GI, seen by [name], NP. She felt for now he was stable with minimal blood seen in stool and she was concerned he may not be able to tolerate an colonoscopy, HGB at 10.3 down 2 points since May of 2017.[name]   He continues to feel shortness of breath with exertion, unchanged. Weight is down 6 lbs.No alcohol, no tobacco.No excessive caffeine. His granddaughter is here with him today and helps with his care. They live together.   Today, he denies symptoms of palpitations, chest pain, shortness of breath, orthopnea, PND, lower extremity edema, dizziness, presyncope, syncope, or neurologic sequela. The patient is tolerating medications without difficulties and is otherwise without complaint today.   Past Medical History:  Diagnosis Date  . Anemia   . Arthritis    arms  . Bladder calculus   . Chronic anxiety   . Coronary artery disease CARDIOLOGIST-  DR Loralie Champagne   04/04/2005 -- CABG x5  . GERD (gastroesophageal reflux disease)   . History of non-ST elevation myocardial infarction (NSTEMI) 03/30/2005   immediate post-op chest and sob 03-30-2005 Left inguinal hernia repair  . History of prostate cancer urologist-  dr Risa Grill   hx radioactive prostate seed implants 1997--  currently on Lupron injection  . Hyperlipidemia   . Hyperplasia of prostate without lower urinary tract symptoms (LUTS)   . Hypertension   . Ischemic cardiomyopathy    echo (01/13) 45-50% septal akinesis, mild MR/  echo  (01/16) ef 60-65%, mid-apical anteroseptal hypokinesis  . PAC (premature atrial contraction)   . S/P CABG x 5 04/04/2005   LIMA to LAD,  SVG to Ramus,  SVG to OM,  seqSVG to PLV and PDA  . Wears glasses    Past Surgical History:  Procedure Laterality Date  . CARDIAC CATHETERIZATION  04/02/2005   dr Doreatha Lew    Critical three-vessel coronary disease --  Essentially normal left ventricular function , ef 55%  . CARDIOVASCULAR STRESS TEST  05-01-2008  dr Doreatha Lew /  dr harding   Low risk nuclear study w/ no ischemia or scar but flattened septal motion/  ef 64%  . CORONARY ARTERY BYPASS GRAFT  04/04/2005   dr  Ricard Dillon   LIMA - LAD,  SVG - Ramus, SVG - OM,  seqSVG - PLV and PDA  . CYSTOSCOPY WITH LITHOLAPAXY  N/A 05/07/2016   Procedure: CYSTOSCOPY WITH LITHOLAPAXY;  Surgeon: Rana Snare, MD;  Location: [facility];  Service: Urology;  Laterality: N/A;  . HOLMIUM LASER APPLICATION N/A [id number]/[id number]   Procedure: HOLMIUM LASER APPLICATION;  Surgeon: Rana Snare, MD;  Location: [facility];  Service: Urology;  Laterality: N/A;  . INGUINAL HERNIA REPAIR Left 03-30-2005  dr Excell Seltzer   incarcerated   . RADIOACTIVE PROSTATE SEED IMPLANTS  1997  . TRANSTHORACIC ECHOCARDIOGRAM  08/24/2014   mid anteroseptal and distal septak hypokinetic,  mild focal basal LVH, ef 60-65%,  grade 1 diastolic dysfunction/  mild MR/  mild LAE/  mild dilated RV with normal  RVSP/  trivial TR    Current Outpatient Prescriptions  Medication Sig Dispense Refill  . apixaban (ELIQUIS) 5 MG TABS tablet Take 1 tablet (5 mg total) by mouth 2 (two) times daily. 60 tablet 11  . bicalutamide (CASODEX) 50 MG tablet Take 50 mg by mouth every morning.   5  . carvedilol (COREG) 12.5 MG tablet TAKE 1 TABLET (12.5 MG TOTAL) BY MOUTH 2 (TWO) TIMES DAILY. (Patient taking differently: Take 12.5 mg by mouth 2 (two) times daily with a meal. TAKE 1 TABLET (12.5 MG TOTAL) BY MOUTH 2 (TWO) TIMES DAILY.) 60 tablet 11  . furosemide (LASIX) 20 MG tablet Take 1 tablet (20 mg total) by mouth daily. 7 tablets (Patient taking differently: Take 20 mg by mouth daily. ) 30 tablet 11  . omeprazole (PRILOSEC) 20 MG capsule TAKE ONE CAPSULE BY MOUTH EVERY DAY (Patient taking differently: TAKE ONE CAPSULE BY MOUTH EVERY DAY--  TAKES IN AM) 90 capsule 2  . rosuvastatin (CRESTOR) 40 MG tablet Take 1 tablet (40 mg total) by mouth daily. 30 tablet 6  . tamsulosin (FLOMAX) 0.4 MG CAPS capsule Take 0.4 mg by mouth every morning.   11   No current facility-administered medications for this encounter.     No Known Allergies  Social History   Social History  . Marital status: Widowed    Spouse name: N/A  . Number of children: N/A  . Years of education: N/A   Occupational History  . Not on file.   Social History Main Topics  . Smoking status: Former Smoker    Years: 2.00    Types: Cigarettes    Quit date: 07/28/1960  . Smokeless tobacco: Never Used  . Alcohol use No  . Drug use: No  . Sexual activity: No   Other Topics Concern  . Not on file   Social History Narrative  . No narrative on file    Family History  Problem Relation Age of Onset  . Heart disease Neg Hx     ROS- All systems are reviewed and negative except as per the HPI above  Physical Exam: Vitals:   10/10/16 0912  BP: (!) 160/86  Pulse: 82  Weight: 194 lb (88 kg)  Height: 5\' 11"  (1.803 m)   Wt Readings from Last  3 Encounters:  10/10/16 194 lb (88 kg)  09/19/16 201 lb 12.8 oz (91.5 kg)  09/17/16 200 lb 12.8 oz (91.1 kg)    Labs: Lab Results  Component Value Date   NA 137 10/10/2016   K 4.3 10/10/2016   CL 108 10/10/2016   CO2 23 10/10/2016   GLUCOSE 107 (H) 10/10/2016   BUN 14 10/10/2016   CREATININE 1.05 10/10/2016   CALCIUM 8.8 (L) 10/10/2016   MG 1.7 09/04/2016   No results found for: INR  Lab Results  Component Value Date   CHOL 96 (L) 02/21/2016   HDL 32 (L) 02/21/2016   LDLCALC 48 02/21/2016   TRIG 80 02/21/2016     GEN- The patient is well appearing, alert and oriented x 3 today.   Head- normocephalic, atraumatic Eyes-  Sclera clear, conjunctiva pink Ears- hearing intact Oropharynx- clear Neck- supple, no JVP Lymph- no cervical lymphadenopathy Lungs- Clear to ausculation bilaterally, normal work of breathing Heart- Regular rate and rhythm, no murmurs, rubs or gallops, PMI not laterally displaced GI- soft, NT, ND, + BS Extremities- no clubbing, cyanosis, or edema MS- no significant deformity or atrophy Skin- no rash or lesion Psych- euthymic mood, full affect Neuro- strength and sensation are intact  EKG- afib rate controlled at 82 bpm, qrs int 86 ms, qtc 455 ms Echo-Study Conclusions  - Left ventricle: The cavity size was normal. Wall thickness was   increased in a pattern of moderate LVH. Systolic function was   normal. The estimated ejection fraction was in the range of 50%   to 55%. The study is not technically sufficient to allow   evaluation of LV diastolic function. - Ventricular septum: Septal motion showed paradox. - Mitral valve: There was moderate regurgitation. - Left atrium: The appendage was severely dilated. - Right ventricle: The cavity size was moderately dilated. - Right atrium: The atrium was moderately dilated. - Atrial septum: No defect or patent foramen ovale was identified. - Tricuspid valve: There was moderate regurgitation. - Pulmonary  arteries: PA peak pressure: 62 mm Hg (S).  CBC-3/16-RBC 3.52,HGB -9.5(10.3-2/21)HCT 30.9    Assessment and Plan: 1. New onset afib Pt states that he has been back on Eliquis 5 mg without interruption since 2/23, chadsvasc score is at least 4. Scheduled pt for cardioversion however his hgb has shown additional drop to 9.5. If he is cardioverted, anticoagulation can not be interrupted x 30 days without increasing his risk for stroke. I have messaged [name], NP to see if cardioversion should be cancelled and further work up of anemia should be done. I will await her opinion before cancelling cardioversion. Pt aware of risk vrs benefit of procedure and wishes to proceed.  f/u in one week if cardioversion is pursued.  Geroge Baseman Mila Homer [facility] [address] [place], [place] [address] [phone]  Addendum, I did speak to [place] and she agreed to see him this week for further GI work up. I will cancel cardioversion for tomorrow until more is known re possible GI bleed.  I will see in one month.  Geroge Baseman Seleta Hovland, [facility] [address] [place], [place] [address] [phone]

## 2016-10-13 NOTE — Addendum Note (Signed)
Encounter addended by: Sherran Needs, NP on: 10/13/2016  9:01 AM<BR>    Actions taken: Sign clinical note

## 2016-10-14 ENCOUNTER — Encounter (HOSPITAL_COMMUNITY): Payer: Self-pay

## 2016-10-14 ENCOUNTER — Ambulatory Visit (HOSPITAL_COMMUNITY): Admit: 2016-10-14 | Payer: Medicare Other | Admitting: Cardiology

## 2016-10-14 SURGERY — CARDIOVERSION
Anesthesia: Monitor Anesthesia Care

## 2016-10-17 ENCOUNTER — Encounter: Payer: Self-pay | Admitting: Nurse Practitioner

## 2016-10-17 ENCOUNTER — Ambulatory Visit (INDEPENDENT_AMBULATORY_CARE_PROVIDER_SITE_OTHER): Payer: Medicare Other | Admitting: Nurse Practitioner

## 2016-10-17 VITALS — BP 144/78 | HR 88 | Ht 70.0 in | Wt 190.0 lb

## 2016-10-17 DIAGNOSIS — K625 Hemorrhage of anus and rectum: Secondary | ICD-10-CM | POA: Diagnosis not present

## 2016-10-17 DIAGNOSIS — Z7901 Long term (current) use of anticoagulants: Secondary | ICD-10-CM | POA: Diagnosis not present

## 2016-10-17 DIAGNOSIS — D649 Anemia, unspecified: Secondary | ICD-10-CM

## 2016-10-17 MED ORDER — NA SULFATE-K SULFATE-MG SULF 17.5-3.13-1.6 GM/177ML PO SOLN: 1.0000 | Freq: Once | ORAL | 0 refills | Status: AC

## 2016-10-17 NOTE — Progress Notes (Signed)
I agree with the above note, plan 

## 2016-10-17 NOTE — Progress Notes (Signed)
HPI: Patient is a 81 year old male who I saw late February as a referral from cardiology for anemia and Hemoccult-positive stools. Patient had had some very minor rectal bleeding since starting anticoagulation for new onset atrial fibrillation. Patient was weak and short of breath when I saw in the office, not felt to be a great candidate for endoscopic workup. Our recommendation was to resume anticoagulation and let us know if there was any further overt bleeding or drop in hemoglobin. Patient was scheduled for a cardioversion last week it was canceled after a drop in hemoglobin from 10.3 to 9.5. He was worked in for further evaluation today. Patient had an echocardiogram in late February, EF was 50-55%.  His breathing has improved since I saw him last.  Patient is here with his granddaughter. He has had intermittent, small volume rectal bleeding with bowel movements since being on blood thinner. He has no abdominal pain. No upper GI symptoms. He does have a history of GERD but asymptomatic on chronic omeprazole.  Past Medical History:  Diagnosis Date  . Anemia   . Arthritis    arms  . Bladder calculus   . Chronic anxiety   . Coronary artery disease CARDIOLOGIST-  DR Loralie Champagne   04/04/2005 -- CABG x5  . GERD (gastroesophageal reflux disease)   . History of non-ST elevation myocardial infarction (NSTEMI) 03/30/2005   immediate post-op chest and sob 03-30-2005 Left inguinal hernia repair  . History of prostate cancer urologist-  dr Risa Grill   hx radioactive prostate seed implants 1997--  currently on Lupron injection  . Hyperlipidemia   . Hyperplasia of prostate without lower urinary tract symptoms (LUTS)   . Hypertension   . Ischemic cardiomyopathy    echo (01/13) 45-50% septal akinesis, mild MR/  echo  (01/16) ef 60-65%, mid-apical anteroseptal hypokinesis  . PAC (premature atrial contraction)   . S/P CABG x 5 04/04/2005   LIMA to LAD,  SVG to Ramus,  SVG to OM,  seqSVG to PLV  and PDA  . Wears glasses     Patient's surgical history, family medical history, social history, medications and allergies were all reviewed in Epic    Physical Exam: BP (!) 144/78   Pulse 88   Ht 5\' 10"  (1.778 m)   Wt 190 lb (86.2 kg)   BMI 27.26 kg/m   GENERAL: well developed white male in NAD PSYCH: :Pleasant, cooperative, normal affect EENT:  conjunctiva pink, mucous membranes moist, neck supple without masses CARDIAC:  Regular rate, irregular rhythm , trace BLE peripheral edema PULM: Normal respiratory effort, lungs CTA bilaterally, no wheezing ABDOMEN:  soft, nontender, nondistended, no obvious masses, no hepatomegaly,  normal bowel sounds SKIN:  turgor, no lesions seen Musculoskeletal:  Normal muscle tone, normal  strength NEURO: Alert and oriented x 3, no focal neurologic deficits   ASSESSMENT and PLAN:  43. 81 year old male with declining hemoglobin and minor rectal bleeding on Eliquis. Baseline hgb 12 in May 2017, it has slowly declined to 9.5.  -for further evaluation patient will be scheduled for a colonoscopy. Will plan for EGD to be done at same time since patient is anemic and will be off blood thinner for procedure.  The risks and benefits of the procedures were discussed and the patient agrees to proceed.    2. CAD, remote CABG / cardiomyopathy but recent echo  >> EF 50-55% / AFib, on Eliquis. Hold Eliquis for 2 days before procedure - will instruct when and  how to resume after procedure. Patient understands that there is a low but real risk of cardiovascular event such as heart attack or embolism while off blood thinner.  The patient consents to proceed. Will communicate by phone or EMR with patient's prescribing provider to confirm that holding Eliquis is reasonable in this case.    [name] , NP 10/17/2016, 10:59 AM

## 2016-10-17 NOTE — Patient Instructions (Signed)
If you are age 79 or older, your body mass index should be between 23-30. Your Body mass index is 27.26 kg/m. If this is out of the aforementioned range listed, please consider follow up with your Primary Care Provider.  If you are age 3 or younger, your body mass index should be between 19-25. Your Body mass index is 27.26 kg/m. If this is out of the aformentioned range listed, please consider follow up with your Primary Care Provider.   We have sent the following medications to your pharmacy for you to pick up at your convenience:  [place] have been scheduled for a colonoscopy. Please follow written instructions given to you at your visit today.  Please pick up your prep supplies at the pharmacy within the next 1-3 days. If you use inhalers (even only as needed), please bring them with you on the day of your procedure. Your physician has requested that you go to www.startemmi.com and enter the access code given to you at your visit today. This web site gives a general overview about your procedure. However, you should still follow specific instructions given to you by our office regarding your preparation for the procedure.  We will contact your cardiologist about discontinuing your Eliqis prior to your colonoscopy. If you have not heard from our office in 1 week please call us to see if we have spoken with the cardiologist.  Thank you.

## 2016-10-21 ENCOUNTER — Ambulatory Visit (HOSPITAL_COMMUNITY): Payer: Medicare Other | Admitting: Nurse Practitioner

## 2016-10-21 ENCOUNTER — Encounter (HOSPITAL_COMMUNITY): Payer: Self-pay | Admitting: *Deleted

## 2016-10-21 ENCOUNTER — Telehealth: Payer: Self-pay

## 2016-10-21 ENCOUNTER — Encounter: Payer: Medicare Other | Admitting: Gastroenterology

## 2016-10-21 NOTE — Telephone Encounter (Signed)
Per Roderic Palau at Dr. Tiajuana Amass office pt can hold his Eliquis 2 days prior to his procedure and restart ASAP after procedure. Pt's granddaughter [name] informed.

## 2016-10-22 NOTE — Anesthesia Preprocedure Evaluation (Addendum)
Anesthesia Evaluation  Patient identified by MRN, date of birth, ID band Patient awake    Reviewed: Allergy & Precautions, NPO status , Patient's Chart, lab work & pertinent test results  Airway Mallampati: II   Neck ROM: Full  Mouth opening: Limited Mouth Opening  Dental  (+) Edentulous Upper   Pulmonary neg pulmonary ROS, former smoker,    breath sounds clear to auscultation       Cardiovascular hypertension, + CAD, + CABG and +CHF  + dysrhythmias Atrial Fibrillation  Rhythm:Irregular Rate:Normal     Neuro/Psych negative neurological ROS     GI/Hepatic GERD  ,  Endo/Other    Renal/GU      Musculoskeletal  (+) Arthritis ,   Abdominal   Peds  Hematology  (+) anemia ,   Anesthesia Other Findings   Reproductive/Obstetrics                            Anesthesia Physical Anesthesia Plan  ASA: III  Anesthesia Plan: MAC   Post-op Pain Management:    Induction: Intravenous  Airway Management Planned: Natural Airway and Nasal Cannula  Additional Equipment:   Intra-op Plan:   Post-operative Plan:   Informed Consent: I have reviewed the patients History and Physical, chart, labs and discussed the procedure including the risks, benefits and alternatives for the proposed anesthesia with the patient or authorized representative who has indicated his/her understanding and acceptance.     Plan Discussed with:   Anesthesia Plan Comments:         Anesthesia Quick Evaluation

## 2016-10-23 ENCOUNTER — Telehealth: Payer: Self-pay | Admitting: Gastroenterology

## 2016-10-23 ENCOUNTER — Encounter (HOSPITAL_COMMUNITY): Payer: Self-pay | Admitting: *Deleted

## 2016-10-23 ENCOUNTER — Ambulatory Visit (HOSPITAL_COMMUNITY): Payer: Medicare Other | Admitting: Anesthesiology

## 2016-10-23 ENCOUNTER — Ambulatory Visit (HOSPITAL_COMMUNITY)
Admission: RE | Admit: 2016-10-23 | Discharge: 2016-10-23 | Disposition: A | Discharge status: Home / Self Care | Payer: Medicare Other | Source: Ambulatory Visit | Attending: Gastroenterology | Admitting: Gastroenterology

## 2016-10-23 ENCOUNTER — Encounter (HOSPITAL_COMMUNITY)
Admission: RE | Disposition: A | Discharge status: Home / Self Care | Payer: Self-pay | Source: Ambulatory Visit | Attending: Gastroenterology

## 2016-10-23 DIAGNOSIS — Z8546 Personal history of malignant neoplasm of prostate: Secondary | ICD-10-CM | POA: Diagnosis not present

## 2016-10-23 DIAGNOSIS — K219 Gastro-esophageal reflux disease without esophagitis: Secondary | ICD-10-CM | POA: Diagnosis not present

## 2016-10-23 DIAGNOSIS — D123 Benign neoplasm of transverse colon: Secondary | ICD-10-CM | POA: Diagnosis not present

## 2016-10-23 DIAGNOSIS — N400 Enlarged prostate without lower urinary tract symptoms: Secondary | ICD-10-CM | POA: Insufficient documentation

## 2016-10-23 DIAGNOSIS — Z87442 Personal history of urinary calculi: Secondary | ICD-10-CM | POA: Diagnosis not present

## 2016-10-23 DIAGNOSIS — M1990 Unspecified osteoarthritis, unspecified site: Secondary | ICD-10-CM | POA: Diagnosis not present

## 2016-10-23 DIAGNOSIS — D124 Benign neoplasm of descending colon: Secondary | ICD-10-CM | POA: Insufficient documentation

## 2016-10-23 DIAGNOSIS — Z923 Personal history of irradiation: Secondary | ICD-10-CM | POA: Diagnosis not present

## 2016-10-23 DIAGNOSIS — D125 Benign neoplasm of sigmoid colon: Secondary | ICD-10-CM | POA: Diagnosis not present

## 2016-10-23 DIAGNOSIS — E785 Hyperlipidemia, unspecified: Secondary | ICD-10-CM | POA: Insufficient documentation

## 2016-10-23 DIAGNOSIS — Z951 Presence of aortocoronary bypass graft: Secondary | ICD-10-CM | POA: Diagnosis not present

## 2016-10-23 DIAGNOSIS — Z7901 Long term (current) use of anticoagulants: Secondary | ICD-10-CM | POA: Diagnosis not present

## 2016-10-23 DIAGNOSIS — I491 Atrial premature depolarization: Secondary | ICD-10-CM | POA: Diagnosis not present

## 2016-10-23 DIAGNOSIS — F419 Anxiety disorder, unspecified: Secondary | ICD-10-CM | POA: Diagnosis not present

## 2016-10-23 DIAGNOSIS — I509 Heart failure, unspecified: Secondary | ICD-10-CM | POA: Insufficient documentation

## 2016-10-23 DIAGNOSIS — Z87891 Personal history of nicotine dependence: Secondary | ICD-10-CM | POA: Insufficient documentation

## 2016-10-23 DIAGNOSIS — K921 Melena: Secondary | ICD-10-CM | POA: Diagnosis not present

## 2016-10-23 DIAGNOSIS — D649 Anemia, unspecified: Secondary | ICD-10-CM | POA: Insufficient documentation

## 2016-10-23 DIAGNOSIS — I2510 Atherosclerotic heart disease of native coronary artery without angina pectoris: Secondary | ICD-10-CM | POA: Insufficient documentation

## 2016-10-23 DIAGNOSIS — K644 Residual hemorrhoidal skin tags: Secondary | ICD-10-CM | POA: Diagnosis not present

## 2016-10-23 DIAGNOSIS — K648 Other hemorrhoids: Secondary | ICD-10-CM | POA: Insufficient documentation

## 2016-10-23 DIAGNOSIS — K5730 Diverticulosis of large intestine without perforation or abscess without bleeding: Secondary | ICD-10-CM | POA: Diagnosis not present

## 2016-10-23 DIAGNOSIS — I255 Ischemic cardiomyopathy: Secondary | ICD-10-CM | POA: Diagnosis not present

## 2016-10-23 DIAGNOSIS — I4891 Unspecified atrial fibrillation: Secondary | ICD-10-CM | POA: Insufficient documentation

## 2016-10-23 DIAGNOSIS — D122 Benign neoplasm of ascending colon: Secondary | ICD-10-CM | POA: Diagnosis not present

## 2016-10-23 DIAGNOSIS — I252 Old myocardial infarction: Secondary | ICD-10-CM | POA: Insufficient documentation

## 2016-10-23 DIAGNOSIS — I110 Hypertensive heart disease with heart failure: Secondary | ICD-10-CM | POA: Insufficient documentation

## 2016-10-23 HISTORY — PX: COLONOSCOPY: SHX5424

## 2016-10-23 SURGERY — COLONOSCOPY
Anesthesia: Monitor Anesthesia Care

## 2016-10-23 MED ORDER — PROPOFOL 10 MG/ML IV BOLUS
INTRAVENOUS | Status: DC | PRN
  Administered 2016-10-23: 30 mg via INTRAVENOUS

## 2016-10-23 MED ORDER — KETOROLAC TROMETHAMINE 0.5 % OP SOLN
1.0000 [drp] | OPHTHALMIC | Status: DC
  Administered 2016-10-23: 2 [drp] via OPHTHALMIC
  Filled 2016-10-23: qty 3

## 2016-10-23 MED ORDER — LIDOCAINE 2% (20 MG/ML) 5 ML SYRINGE
INTRAMUSCULAR | Status: DC | PRN
  Administered 2016-10-23: 100 mg via INTRAVENOUS

## 2016-10-23 MED ORDER — ONDANSETRON HCL 4 MG/2ML IJ SOLN
INTRAMUSCULAR | Status: DC | PRN
  Administered 2016-10-23: 4 mg via INTRAVENOUS

## 2016-10-23 MED ORDER — ONDANSETRON HCL 4 MG/2ML IJ SOLN
INTRAMUSCULAR | Status: AC
  Filled 2016-10-23: qty 2

## 2016-10-23 MED ORDER — PHENYLEPHRINE 40 MCG/ML (10ML) SYRINGE FOR IV PUSH (FOR BLOOD PRESSURE SUPPORT)
PREFILLED_SYRINGE | INTRAVENOUS | Status: AC
  Filled 2016-10-23: qty 10

## 2016-10-23 MED ORDER — PHENYLEPHRINE 40 MCG/ML (10ML) SYRINGE FOR IV PUSH (FOR BLOOD PRESSURE SUPPORT)
PREFILLED_SYRINGE | INTRAVENOUS | Status: DC | PRN
  Administered 2016-10-23 (×6): 80 ug via INTRAVENOUS

## 2016-10-23 MED ORDER — SPOT INK MARKER SYRINGE KIT
PACK | SUBMUCOSAL | Status: AC
  Filled 2016-10-23: qty 5

## 2016-10-23 MED ORDER — PROPOFOL 10 MG/ML IV BOLUS
INTRAVENOUS | Status: AC
  Filled 2016-10-23: qty 20

## 2016-10-23 MED ORDER — LACTATED RINGERS IV SOLN
INTRAVENOUS | Status: DC
  Administered 2016-10-23: 08:00:00 via INTRAVENOUS

## 2016-10-23 MED ORDER — LIDOCAINE 2% (20 MG/ML) 5 ML SYRINGE
INTRAMUSCULAR | Status: AC
  Filled 2016-10-23: qty 5

## 2016-10-23 MED ORDER — PROPOFOL 500 MG/50ML IV EMUL
INTRAVENOUS | Status: DC | PRN
  Administered 2016-10-23: 125 ug/kg/min via INTRAVENOUS

## 2016-10-23 MED ORDER — PROPOFOL 10 MG/ML IV BOLUS
INTRAVENOUS | Status: AC
  Filled 2016-10-23: qty 60

## 2016-10-23 NOTE — Progress Notes (Signed)
Eye drops and patch done per order. Went over instructions with granddaughter who is caretaker. She voices understanding to keep eye covered and to do one drop tonight around 8. If eye does not feel better will follow up with his family doctor.

## 2016-10-23 NOTE — Anesthesia Postprocedure Evaluation (Addendum)
Anesthesia Post Note  Patient: Carl [name]  Procedure(s) Performed: Procedure(s) (LRB): COLONOSCOPY (N/A)  Patient location during evaluation: Endoscopy Anesthesia Type: MAC Level of consciousness: awake and alert Pain management: pain level controlled Vital Signs Assessment: post-procedure vital signs reviewed and stable Respiratory status: spontaneous breathing, nonlabored ventilation, respiratory function stable and patient connected to nasal cannula oxygen Cardiovascular status: stable and blood pressure returned to baseline Anesthetic complications: no       Last Vitals:  Vitals:   10/23/16 1110 10/23/16 1115  BP: 127/66   Pulse: 61 (!) 41  Resp: 14 17  Temp:      Last Pain:  Vitals:   10/23/16 0723  TempSrc: Oral                 Marielouise Amey,JAMES TERRILL

## 2016-10-23 NOTE — Discharge Instructions (Signed)

## 2016-10-23 NOTE — Progress Notes (Signed)
Dr. Orene Desanctis to see pt before discharge . Ordering ketorolac eye drops and eye patch. Will place 2 drops in before discharge then patch and family will place one drop at [place] and will keep it covered.

## 2016-10-23 NOTE — Transfer of Care (Signed)
Immediate Anesthesia Transfer of Care Note  Patient: Carl [name]  Procedure(s) Performed: Procedure(s): ESOPHAGOGASTRODUODENOSCOPY (EGD) (N/A) COLONOSCOPY (N/A)  Patient Location: PACU  Anesthesia Type:MAC  Level of Consciousness:  sedated, patient cooperative and responds to stimulation  Airway & Oxygen Therapy:Patient Spontanous Breathing and Patient connected to face mask oxgen  Post-op Assessment:  Report given to PACU RN and Post -op Vital signs reviewed and stable  Post vital signs:  Reviewed and stable  Last Vitals:  Vitals:   10/23/16 0723  BP: 117/80  Pulse: 70  Resp: (!) 21  Temp: 33.2 C    Complications: No apparent anesthesia complications

## 2016-10-23 NOTE — H&P (View-Only) (Signed)
HPI: Patient is a 81 year old male who I saw late February as a referral from cardiology for anemia and Hemoccult-positive stools. Patient had had some very minor rectal bleeding since starting anticoagulation for new onset atrial fibrillation. Patient was weak and short of breath when I saw in the office, not felt to be a great candidate for endoscopic workup. Our recommendation was to resume anticoagulation and let us know if there was any further overt bleeding or drop in hemoglobin. Patient was scheduled for a cardioversion last week it was canceled after a drop in hemoglobin from 10.3 to 9.5. He was worked in for further evaluation today. Patient had an echocardiogram in late February, EF was 50-55%.  His breathing has improved since I saw him last.  Patient is here with his granddaughter. He has had intermittent, small volume rectal bleeding with bowel movements since being on blood thinner. He has no abdominal pain. No upper GI symptoms. He does have a history of GERD but asymptomatic on chronic omeprazole.  Past Medical History:  Diagnosis Date  . Anemia   . Arthritis    arms  . Bladder calculus   . Chronic anxiety   . Coronary artery disease CARDIOLOGIST-  DR Loralie Champagne   04/04/2005 -- CABG x5  . GERD (gastroesophageal reflux disease)   . History of non-ST elevation myocardial infarction (NSTEMI) 03/30/2005   immediate post-op chest and sob 03-30-2005 Left inguinal hernia repair  . History of prostate cancer urologist-  dr Risa Grill   hx radioactive prostate seed implants 1997--  currently on Lupron injection  . Hyperlipidemia   . Hyperplasia of prostate without lower urinary tract symptoms (LUTS)   . Hypertension   . Ischemic cardiomyopathy    echo (01/13) 45-50% septal akinesis, mild MR/  echo  (01/16) ef 60-65%, mid-apical anteroseptal hypokinesis  . PAC (premature atrial contraction)   . S/P CABG x 5 04/04/2005   LIMA to LAD,  SVG to Ramus,  SVG to OM,  seqSVG to PLV  and PDA  . Wears glasses     Patient's surgical history, family medical history, social history, medications and allergies were all reviewed in Epic    Physical Exam: BP (!) 144/78   Pulse 88   Ht 5\' 10"  (1.778 m)   Wt 190 lb (86.2 kg)   BMI 27.26 kg/m   GENERAL: well developed white male in NAD PSYCH: :Pleasant, cooperative, normal affect EENT:  conjunctiva pink, mucous membranes moist, neck supple without masses CARDIAC:  Regular rate, irregular rhythm , trace BLE peripheral edema PULM: Normal respiratory effort, lungs CTA bilaterally, no wheezing ABDOMEN:  soft, nontender, nondistended, no obvious masses, no hepatomegaly,  normal bowel sounds SKIN:  turgor, no lesions seen Musculoskeletal:  Normal muscle tone, normal  strength NEURO: Alert and oriented x 3, no focal neurologic deficits   ASSESSMENT and PLAN:  9. 81 year old male with declining hemoglobin and minor rectal bleeding on Eliquis. Baseline hgb 12 in May 2017, it has slowly declined to 9.5.  -for further evaluation patient will be scheduled for a colonoscopy. Will plan for EGD to be done at same time since patient is anemic and will be off blood thinner for procedure.  The risks and benefits of the procedures were discussed and the patient agrees to proceed.    2. CAD, remote CABG / cardiomyopathy but recent echo  >> EF 50-55% / AFib, on Eliquis. Hold Eliquis for 2 days before procedure - will instruct when and  how to resume after procedure. Patient understands that there is a low but real risk of cardiovascular event such as heart attack or embolism while off blood thinner.  The patient consents to proceed. Will communicate by phone or EMR with patient's prescribing provider to confirm that holding Eliquis is reasonable in this case.    [name] , NP 10/17/2016, 10:59 AM

## 2016-10-23 NOTE — Progress Notes (Signed)
26 Dr. Orene Desanctis by to see pt. Pt has been complaining of itchy painful left eye " feels like something is in it"  Dr. Orene Desanctis talked with pt and granddaughter. Encouraging pt not to rub or scratch eye.  Dr. Orene Desanctis will be back by to see pt before discharge.

## 2016-10-23 NOTE — Op Note (Signed)
[facility] Patient Name: Carl [name] Procedure Date: 10/23/2016 MRN: [id number] Attending MD: Milus Banister , MD Date of Birth: 10-23-1935 CSN: [id number] Age: 81 Admit Type: Outpatient Procedure:                Colonoscopy Indications:              Hematochezia, Heme positive stool Providers:                Milus Banister, MD, Hilma Favors, RN, Alfonso Patten, Technician, Marla Roe, CRNA Referring MD:              Medicines:                Monitored Anesthesia Care Complications:            No immediate complications. Estimated blood loss:                            None. Estimated Blood Loss:     Estimated blood loss: none. Procedure:                Pre-Anesthesia Assessment:                           - Prior to the procedure, a History and Physical                            was performed, and patient medications and                            allergies were reviewed. The patient's tolerance of                            previous anesthesia was also reviewed. The risks                            and benefits of the procedure and the sedation                            options and risks were discussed with the patient.                            All questions were answered, and informed consent                            was obtained. Prior Anticoagulants: The patient has                            taken Eliquis (apixaban), last dose was 5 days                            prior to procedure. ASA Grade Assessment: III - A  patient with severe systemic disease. After                            reviewing the risks and benefits, the patient was                            deemed in satisfactory condition to undergo the                            procedure.                           After obtaining informed consent, the colonoscope                            was passed under direct vision. Throughout the                        procedure, the patient's blood pressure, pulse, and                            oxygen saturations were monitored continuously. The                            was introduced through the anus and advanced to the                            the cecum, identified by appendiceal orifice and                            ileocecal valve. The colonoscopy was performed                            without difficulty. The patient tolerated the                            procedure well. The quality of the bowel                            preparation was good. The ileocecal valve,                            appendiceal orifice, and rectum were photographed. Scope In: 8:27:01 AM Scope Out: 9:17:47 AM Scope Withdrawal Time: 0 hours 47 minutes 18 seconds  Total Procedure Duration: 0 hours 50 minutes 46 seconds  Findings:      A 35 mm polyp was found in the sigmoid colon. The polyp was       pedunculated. The polyp was removed with a hot snare. Resection and       retrieval were complete. Area was tattooed with an injection of [place]       ink. To prevent bleeding after the polypectomy, two hemostatic clips       were successfully placed (MR conditional). (jar 5)      Five sessile polyps were found in the sigmoid colon, transverse colon  and ascending colon. The polyps were 5 to 9 mm in size. These polyps       were removed with a cold snare. Resection and retrieval were complete.      Seven pedunculated and semi-pedunculated polyps were found in the       descending colon, transverse colon, hepatic flexure and ascending colon.       The polyps were 10 to 17 mm in size. These polyps were removed with a       hot snare. Resection and retrieval were complete.      There were several other smaller, adenomatous appearing polyps       predominantly in the right colon. I estimate 10-12 polyps remain, these       range in size from 3-25mm.      Multiple small and large-mouthed diverticula  were found in the left       colon.      External and internal hemorrhoids were found. The hemorrhoids were small.      The exam was otherwise without abnormality on direct and retroflexion       views. Impression:               - One 35 mm polyp in the sigmoid colon, removed                            with a hot snare. Resected and retrieved. Tattooed.                            Clips (MR conditional) were placed.                           - Five 5 to 9 mm polyps in the sigmoid colon, in                            the transverse colon and in the ascending colon,                            removed with a cold snare. Resected and retrieved.                           - Seven 10 to 17 mm polyps in the descending colon,                            in the transverse colon, at the hepatic flexure and                            in the ascending colon, removed with a hot snare.                            Resected and retrieved.                           - Several smaller adenomatous polyps remain. These                            are at low risk for bleeding,  even when he resumes                            his blood thinner. I plan to remove these after                            several months, after cardiac issues settled as                            long as none of the polyps removed today are                            malignant.                           - Diverticulosis in the left colon.                           - External and internal hemorrhoids.                           - The examination was otherwise normal on direct                            and retroflexion views. Moderate Sedation:      N/A- Per Anesthesia Care Recommendation:           - Patient has a contact number available for                            emergencies. The signs and symptoms of potential                            delayed complications were discussed with the                            patient. Return to  normal activities tomorrow.                            Written discharge instructions were provided to the                            patient.                           - Resume previous diet.                           - Continue present medications. OK to resume his                            blood thinner tomorrow.                           - Repeat colonoscopy is recommended. The  colonoscopy date will be determined after pathology                            results from today's exam become available for                            review. Procedure Code(s):        --- Professional ---                           [phone], Colonoscopy, flexible; with removal of                            tumor(s), polyp(s), or other lesion(s) by snare                            technique                           [id number], Colonoscopy, flexible; with directed                            submucosal injection(s), any substance Diagnosis Code(s):        --- Professional ---                           D12.5, Benign neoplasm of sigmoid colon                           D12.4, Benign neoplasm of descending colon                           D12.3, Benign neoplasm of transverse colon (hepatic                            flexure or splenic flexure)                           D12.2, Benign neoplasm of ascending colon                           K64.8, Other hemorrhoids                           K92.1, Melena (includes Hematochezia)                           R19.5, Other fecal abnormalities                           K57.30, Diverticulosis of large intestine without                            perforation or abscess without bleeding CPT copyright 2016 American Medical Association. All rights reserved. The codes documented in this report are preliminary and upon coder review may  be revised to meet current compliance requirements. Milus Banister, MD 10/23/2016 9:33:36 AM This  report has been signed  electronically. Number of Addenda: 0

## 2016-10-23 NOTE — Telephone Encounter (Signed)
Ok, thanks.

## 2016-10-23 NOTE — Interval H&P Note (Signed)
History and Physical Interval Note:  10/23/2016 11:25 AM  Carl [name]  has presented today for surgery, with the diagnosis of Anemia, rectal bleeding, chronic anticoagulation, AH  The various methods of treatment have been discussed with the patient and family. After consideration of risks, benefits and other options for treatment, the patient has consented to  Procedure(s): COLONOSCOPY (N/A) as a surgical intervention .  The patient's history has been reviewed, patient examined, no change in status, stable for surgery.  I have reviewed the patient's chart and labs.  Questions were answered to the patient's satisfaction.     Milus Banister

## 2016-10-23 NOTE — Telephone Encounter (Signed)
Dr Jac Canavan:  The pt's granddaughter is calling to report on the pt who had colon this morning and is home now with severe headache, body aches, pt states 10/10 pain :all over"  and BR rectal bleeding filling the toilet.  No abd pain, feels warm but they do not have a thermometer.  Pt is in the back ground having a hard time completing sentences.  He is telling the granddaughter he is not going to the ED.  I advised her that he needs to go to the ED for eval and she states she will call EMS and have him transported.

## 2016-10-24 LAB — POCT I-STAT 4, (NA,K, GLUC, HGB,HCT)
Glucose, Bld: 104 mg/dL — ABNORMAL HIGH (ref 65–99)
HCT: 28 % — ABNORMAL LOW (ref 39.0–52.0)
Hemoglobin: 9.5 g/dL — ABNORMAL LOW (ref 13.0–17.0)
Potassium: 4.3 mmol/L (ref 3.5–5.1)
Sodium: 142 mmol/L (ref 135–145)

## 2016-10-30 ENCOUNTER — Telehealth: Payer: Self-pay | Admitting: Cardiology

## 2016-10-30 DIAGNOSIS — I509 Heart failure, unspecified: Secondary | ICD-10-CM

## 2016-10-30 NOTE — Telephone Encounter (Signed)
Spoke with pt grand daughter, the patient will not see his new PCP for 6 weeks. He is currently sleeping in a recliner for better breathing and it is causing some back issues. He would be able to get a hospital bed with his insurance they just need an order. Discussed with luke, okay to order. Order placed.

## 2016-10-30 NOTE — Telephone Encounter (Signed)
New Message  Pts grand-daughter-in law voiced wanting to know if we can put order in for hospital bed.  Please f/u

## 2016-10-30 NOTE — Telephone Encounter (Signed)
I am not this pt's primary cardiologist. He was transferred to the [facility] office. I would suggest she contact the pt's primary care provider for a hospital bed.   [name] PA-C 10/30/2016 1:37 PM

## 2016-10-31 ENCOUNTER — Telehealth: Payer: Self-pay | Admitting: Cardiology

## 2016-10-31 NOTE — Telephone Encounter (Signed)
New Message  Pts granddaughter in law voiced she reached out to [facility] and they did not see an order and told her to give [place] a call back.  Please f/u

## 2016-10-31 NOTE — Telephone Encounter (Signed)
Spoke with Ms. Fields and informed her that the order has been faxed to [place] at [facility] care.

## 2016-11-05 ENCOUNTER — Encounter (HOSPITAL_COMMUNITY): Payer: Self-pay | Admitting: Nurse Practitioner

## 2016-11-05 ENCOUNTER — Ambulatory Visit (HOSPITAL_COMMUNITY)
Admission: RE | Admit: 2016-11-05 | Discharge: 2016-11-05 | Disposition: A | Discharge status: Home / Self Care | Payer: Medicare Other | Source: Ambulatory Visit | Attending: Nurse Practitioner | Admitting: Nurse Practitioner

## 2016-11-05 VITALS — BP 122/76 | HR 61 | Ht 70.0 in | Wt 187.0 lb

## 2016-11-05 DIAGNOSIS — Z8261 Family history of arthritis: Secondary | ICD-10-CM | POA: Insufficient documentation

## 2016-11-05 DIAGNOSIS — I255 Ischemic cardiomyopathy: Secondary | ICD-10-CM | POA: Diagnosis not present

## 2016-11-05 DIAGNOSIS — I10 Essential (primary) hypertension: Secondary | ICD-10-CM | POA: Diagnosis not present

## 2016-11-05 DIAGNOSIS — I481 Persistent atrial fibrillation: Secondary | ICD-10-CM | POA: Diagnosis not present

## 2016-11-05 DIAGNOSIS — Z951 Presence of aortocoronary bypass graft: Secondary | ICD-10-CM | POA: Diagnosis not present

## 2016-11-05 DIAGNOSIS — E785 Hyperlipidemia, unspecified: Secondary | ICD-10-CM | POA: Diagnosis not present

## 2016-11-05 DIAGNOSIS — Z87891 Personal history of nicotine dependence: Secondary | ICD-10-CM | POA: Diagnosis not present

## 2016-11-05 DIAGNOSIS — I252 Old myocardial infarction: Secondary | ICD-10-CM | POA: Insufficient documentation

## 2016-11-05 DIAGNOSIS — Z9889 Other specified postprocedural states: Secondary | ICD-10-CM | POA: Diagnosis not present

## 2016-11-05 DIAGNOSIS — I2510 Atherosclerotic heart disease of native coronary artery without angina pectoris: Secondary | ICD-10-CM | POA: Insufficient documentation

## 2016-11-05 DIAGNOSIS — N400 Enlarged prostate without lower urinary tract symptoms: Secondary | ICD-10-CM | POA: Diagnosis not present

## 2016-11-05 DIAGNOSIS — Z79899 Other long term (current) drug therapy: Secondary | ICD-10-CM | POA: Diagnosis not present

## 2016-11-05 DIAGNOSIS — I4891 Unspecified atrial fibrillation: Secondary | ICD-10-CM | POA: Diagnosis not present

## 2016-11-05 DIAGNOSIS — K219 Gastro-esophageal reflux disease without esophagitis: Secondary | ICD-10-CM | POA: Diagnosis not present

## 2016-11-05 DIAGNOSIS — I491 Atrial premature depolarization: Secondary | ICD-10-CM | POA: Insufficient documentation

## 2016-11-05 DIAGNOSIS — Z7901 Long term (current) use of anticoagulants: Secondary | ICD-10-CM | POA: Insufficient documentation

## 2016-11-05 DIAGNOSIS — Z8546 Personal history of malignant neoplasm of prostate: Secondary | ICD-10-CM | POA: Diagnosis not present

## 2016-11-05 DIAGNOSIS — Z8249 Family history of ischemic heart disease and other diseases of the circulatory system: Secondary | ICD-10-CM | POA: Insufficient documentation

## 2016-11-05 DIAGNOSIS — D649 Anemia, unspecified: Secondary | ICD-10-CM | POA: Diagnosis not present

## 2016-11-05 DIAGNOSIS — I4819 Other persistent atrial fibrillation: Secondary | ICD-10-CM

## 2016-11-05 LAB — CBC
HCT: 29.3 % — ABNORMAL LOW (ref 39.0–52.0)
Hemoglobin: 8.7 g/dL — ABNORMAL LOW (ref 13.0–17.0)
MCH: 25.1 pg — ABNORMAL LOW (ref 26.0–34.0)
MCHC: 29.7 g/dL — ABNORMAL LOW (ref 30.0–36.0)
MCV: 84.7 fL (ref 78.0–100.0)
Platelets: 205 10*3/uL (ref 150–400)
RBC: 3.46 MIL/uL — ABNORMAL LOW (ref 4.22–5.81)
RDW: 16.3 % — ABNORMAL HIGH (ref 11.5–15.5)
WBC: 5.2 10*3/uL (ref 4.0–10.5)

## 2016-11-05 LAB — BASIC METABOLIC PANEL
Anion gap: 8 (ref 5–15)
BUN: 26 mg/dL — ABNORMAL HIGH (ref 6–20)
CO2: 24 mmol/L (ref 22–32)
Calcium: 9.1 mg/dL (ref 8.9–10.3)
Chloride: 109 mmol/L (ref 101–111)
Creatinine, Ser: 1.11 mg/dL (ref 0.61–1.24)
GFR calc Af Amer: >60 mL/min (ref >60)
GFR calc non Af Amer: >60 mL/min (ref >60)
Glucose, Bld: 112 mg/dL — ABNORMAL HIGH (ref 65–99)
Potassium: 4.2 mmol/L (ref 3.5–5.1)
Sodium: 141 mmol/L (ref 135–145)

## 2016-11-05 NOTE — Progress Notes (Signed)
Primary Care Physician: No PCP Per Patient Referring Physician:Luke [name], [name]   Carl [name] is a 81 y.o. male with a h/o CAD- s/p CABG x 5 in 2006. He has had no further issues from that standpoint. He has had a moderate CM in the past but his echo in Jan 2016 showed his EF was 60-65%. The pt went to an Urgent Care in [facility] 09/03/16 with complaints of increased DOE and orthopnea x 2 weeks. He was felt to be in AF with CHF.[name] His rate was controlled-80's. He was placed on Lasix 20 mg x 7 days and referred to Carl [name], [name] for further evaluation. He was unaware of his HR being irregular. His dyspnea improved on the Lasix but did not completely resolve. He had been a prior pt of Carl [name] and when he saw Carl [name], requested a MD at [facility] office instead of [facility] due to the further distance for [employer]. Carl [name] thought he needed cardioversion when he satisfied the 3 weeks loading of DOAC. He went on to c/o of some blood in the stool with a drop in HGB, Carl [name] was concerned with his paleness and was referred to GI, seen by Carl [name]. She felt for now he was stable with minimal blood seen in stool and she was concerned he may not be able to tolerate an colonoscopy, HGB at 10.3 down 2 points since May of 2017.[name]   He continues to feel shortness of breath with exertion, unchanged. Weight is down 6 lbs.No alcohol, no tobacco.No excessive caffeine. His granddaughter is here with him today and helps with his care. They live together.  F/u in afib clinic 4/11. He was referred to GI when his pre cardioversion labs showed a further drop in hgb. DCCV was cancelled and he was sent back to GI. He underwent colonoscopy 3/29 and had 14 polyps removed but many more small ones were left in place. Bx neg for cancer.` He will need to have repeat colonoscopy in the fall. He restarted DOAC 4/2 and will have been back on x 3 weeks 4/23. Will plan for cardioversion 4/25.   Today, he denies symptoms of  palpitations, chest pain, shortness of breath, orthopnea, PND, lower extremity edema, dizziness, presyncope, syncope, or neurologic sequela. The patient is tolerating medications without difficulties and is otherwise without complaint today.   Past Medical History:  Diagnosis Date  . Anemia   . Arthritis    arms  . Bladder calculus   . Chronic anxiety   . Coronary artery disease CARDIOLOGIST-  DR Carl [name]   04/04/2005 -- CABG x5  . GERD (gastroesophageal reflux disease)   . History of non-ST elevation myocardial infarction (NSTEMI) 03/30/2005   immediate post-op chest and sob 03-30-2005 Left inguinal hernia repair  . History of prostate cancer urologist-  dr Carl [name]   hx radioactive prostate seed implants 1997--  currently on Lupron injection  . Hyperlipidemia   . Hyperplasia of prostate without lower urinary tract symptoms (LUTS)   . Hypertension   . Ischemic cardiomyopathy    echo (01/13) 45-50% septal akinesis, mild MR/  echo  (01/16) ef 60-65%, mid-apical anteroseptal hypokinesis  . PAC (premature atrial contraction)   . S/P CABG x 5 04/04/2005   LIMA to LAD,  SVG to Ramus,  SVG to OM,  seqSVG to PLV and PDA  . Wears glasses    Past Surgical History:  Procedure Laterality Date  . CARDIAC CATHETERIZATION  04/02/2005   dr Carl [name]  Critical three-vessel coronary disease --  Essentially normal left ventricular function , ef 55%  . CARDIOVASCULAR STRESS TEST  05-01-2008  dr Carl [name] /  dr Carl [name]   Low risk nuclear study w/ no ischemia or scar but flattened septal motion/  ef 64%  . COLONOSCOPY N/A 10/23/2016   Procedure: COLONOSCOPY;  Surgeon: Carl Banister, MD;  Location: [facility];  Service: Endoscopy;  Laterality: N/A;  . CORONARY ARTERY BYPASS GRAFT  04/04/2005   dr  Carl [name]   LIMA - LAD,  SVG - Ramus, SVG - OM,  seqSVG - PLV and PDA  . CYSTOSCOPY WITH LITHOLAPAXY N/A 05/07/2016   Procedure: CYSTOSCOPY WITH LITHOLAPAXY;  Surgeon: Carl Snare, MD;  Location: [facility];  Service: Urology;  Laterality: N/A;  . HOLMIUM LASER APPLICATION N/A 72/53/6644   Procedure: HOLMIUM LASER APPLICATION;  Surgeon: Carl Snare, MD;  Location: [facility];  Service: Urology;  Laterality: N/A;  . INGUINAL HERNIA REPAIR Left 03-30-2005  dr Carl [name]   incarcerated   . RADIOACTIVE PROSTATE SEED IMPLANTS  1997  . TRANSTHORACIC ECHOCARDIOGRAM  08/24/2014   mid anteroseptal and distal septak hypokinetic,  mild focal basal LVH, ef 60-65%,  grade 1 diastolic dysfunction/  mild MR/  mild LAE/  mild dilated RV with normal RVSP/  trivial TR    Current Outpatient Prescriptions  Medication Sig Dispense Refill  . apixaban (ELIQUIS) 5 MG TABS tablet Take 1 tablet (5 mg total) by mouth 2 (two) times daily. 60 tablet 11  . bicalutamide (CASODEX) 50 MG tablet Take 50 mg by mouth daily with breakfast.   5  . carvedilol (COREG) 12.5 MG tablet TAKE 1 TABLET (12.5 MG TOTAL) BY MOUTH 2 (TWO) TIMES DAILY. 60 tablet 11  . furosemide (LASIX) 20 MG tablet Take 1 tablet (20 mg total) by mouth daily. 7 tablets (Patient taking differently: Take 20 mg by mouth daily. ) 30 tablet 11  . ibuprofen (ADVIL,MOTRIN) 200 MG tablet Take 400 mg by mouth daily as needed for headache or moderate pain.    . naproxen sodium (ANAPROX) 220 MG tablet Take 220 mg by mouth daily as needed (pain).    [name] omeprazole (PRILOSEC) 20 MG capsule TAKE ONE CAPSULE BY MOUTH EVERY DAY 90 capsule 2  . rosuvastatin (CRESTOR) 40 MG tablet Take 1 tablet (40 mg total) by mouth daily. 30 tablet 6  . tamsulosin (FLOMAX) 0.4 MG CAPS capsule Take 0.4 mg by mouth every morning.   11   No current facility-administered medications for this encounter.     No Known Allergies  Social History   Social History  . Marital status: Widowed    Spouse name: N/A  . Number of children: 3  . Years of education: N/A   Occupational History  . retired    Social History Main Topics  . Smoking status: Former Smoker      Years: 2.00    Types: Cigarettes    Quit date: 07/28/1960  . Smokeless tobacco: Never Used  . Alcohol use No  . Drug use: No  . Sexual activity: No   Other Topics Concern  . Not on file   Social History Narrative  . No narrative on file    Family History  Problem Relation Age of Onset  . Heart disease Neg Hx     ROS- All systems are reviewed and negative except as per the HPI above  Physical Exam: Vitals:   11/05/16 1007  BP: 122/76  Pulse:  61  Weight: 187 lb (84.8 kg)  Height: 5\' 10"  (1.778 m)   Wt Readings from Last 3 Encounters:  11/05/16 187 lb (84.8 kg)  10/23/16 190 lb (86.2 kg)  10/17/16 190 lb (86.2 kg)    Labs: Lab Results  Component Value Date   NA 141 11/05/2016   K 4.2 11/05/2016   CL 109 11/05/2016   CO2 24 11/05/2016   GLUCOSE 112 (H) 11/05/2016   BUN 26 (H) 11/05/2016   CREATININE 1.11 11/05/2016   CALCIUM 9.1 11/05/2016   MG 1.7 09/04/2016   No results found for: INR Lab Results  Component Value Date   CHOL 96 (L) 02/21/2016   HDL 32 (L) 02/21/2016   LDLCALC 48 02/21/2016   TRIG 80 02/21/2016     GEN- The patient is well appearing, alert and oriented x 3 today.   Head- normocephalic, atraumatic Eyes-  Sclera clear, conjunctiva pink Ears- hearing intact Oropharynx- clear Neck- supple, no JVP Lymph- no cervical lymphadenopathy Lungs- Clear to ausculation bilaterally, normal work of breathing Heart- Regular rate and rhythm, no murmurs, rubs or gallops, PMI not laterally displaced GI- soft, NT, ND, + BS Extremities- no clubbing, cyanosis, or edema MS- no significant deformity or atrophy Skin- no rash or lesion Psych- euthymic mood, full affect Neuro- strength and sensation are intact  EKG- afib rate controlled at 61 bpm, qrs int 86 ms, qtc 448 ms Echo-Study Conclusions  - Left ventricle: The cavity size was normal. Wall thickness was   increased in a pattern of moderate LVH. Systolic function was   normal. The estimated  ejection fraction was in the range of 50%   to 55%. The study is not technically sufficient to allow   evaluation of LV diastolic function. - Ventricular septum: Septal motion showed paradox. - Mitral valve: There was moderate regurgitation. - Left atrium: The appendage was severely dilated. - Right ventricle: The cavity size was moderately dilated. - Right atrium: The atrium was moderately dilated. - Atrial septum: No defect or patent foramen ovale was identified. - Tricuspid valve: There was moderate regurgitation. - Pulmonary arteries: PA peak pressure: 62 mm Hg (S).  CBC-3/16-RBC 3.52,HGB -9.5(10.3-2/21)HCT 30.9    Assessment and Plan: 1. New onset afib Pt states that he has been back on Eliquis 5 mg without interruption since 4/2, chadsvasc score is at least 4. Scheduled pt for cardioversion  for 4/25  after being back on eliquis uninterrupted x 3 weeks   Pt aware of risk vrs benefit of procedure and wishes to proceed Hold BB am of cardioverion bmet,cbc today  2. Anemia Multiple polys removed Back on DOAC without any visible blood seen by pt in stool Has been taking ibuprofen for pain and recommended to avoid NSAIDS and take tylenol instead   f/u in one week after cardioversion   Butch Penny C. [facility] [address] [place], [place] [address] [phone]  .

## 2016-11-05 NOTE — Patient Instructions (Signed)
   Your cardioversion is scheduled for  11/19/2016.  Please be there at 11:30 a.m. Arrive at the [facility] and go to admitting at (time) Do Not eat or drink anything after midnight the night prior to your procedure. Take all your medications with a sip of water prior to arrival. Do NOT miss any doses of your blood thinner. You will NOT be able to drive home after your procedure.   HOLD carvedilol the morning of the cardioversion.   DO NOT miss any doses of your blood thinner.

## 2016-11-06 ENCOUNTER — Other Ambulatory Visit (HOSPITAL_COMMUNITY): Payer: Self-pay | Admitting: *Deleted

## 2016-11-14 ENCOUNTER — Inpatient Hospital Stay (HOSPITAL_COMMUNITY): Admission: RE | Admit: 2016-11-14 | Payer: Medicare Other | Source: Ambulatory Visit | Admitting: Nurse Practitioner

## 2016-11-17 ENCOUNTER — Ambulatory Visit (HOSPITAL_COMMUNITY)
Admission: RE | Admit: 2016-11-17 | Discharge: 2016-11-17 | Disposition: A | Discharge status: Home / Self Care | Payer: Medicare Other | Source: Ambulatory Visit | Attending: Nurse Practitioner | Admitting: Nurse Practitioner

## 2016-11-17 ENCOUNTER — Other Ambulatory Visit: Payer: Self-pay | Admitting: Cardiology

## 2016-11-17 DIAGNOSIS — D649 Anemia, unspecified: Secondary | ICD-10-CM | POA: Insufficient documentation

## 2016-11-17 LAB — CBC
HCT: 30.6 % — ABNORMAL LOW (ref 39.0–52.0)
Hemoglobin: 9.2 g/dL — ABNORMAL LOW (ref 13.0–17.0)
MCH: 25 pg — ABNORMAL LOW (ref 26.0–34.0)
MCHC: 30.1 g/dL (ref 30.0–36.0)
MCV: 83.2 fL (ref 78.0–100.0)
Platelets: 193 10*3/uL (ref 150–400)
RBC: 3.68 MIL/uL — ABNORMAL LOW (ref 4.22–5.81)
RDW: 17 % — ABNORMAL HIGH (ref 11.5–15.5)
WBC: 7.9 10*3/uL (ref 4.0–10.5)

## 2016-11-19 ENCOUNTER — Ambulatory Visit (HOSPITAL_COMMUNITY)
Admission: RE | Admit: 2016-11-19 | Discharge: 2016-11-19 | Disposition: A | Discharge status: Home / Self Care | Payer: Medicare Other | Source: Ambulatory Visit | Attending: Cardiology | Admitting: Cardiology

## 2016-11-19 ENCOUNTER — Ambulatory Visit (HOSPITAL_COMMUNITY): Payer: Medicare Other | Admitting: Certified Registered"

## 2016-11-19 ENCOUNTER — Encounter (HOSPITAL_COMMUNITY): Payer: Self-pay

## 2016-11-19 ENCOUNTER — Encounter (HOSPITAL_COMMUNITY)
Admission: RE | Disposition: A | Discharge status: Home / Self Care | Payer: Self-pay | Source: Ambulatory Visit | Attending: Cardiology

## 2016-11-19 DIAGNOSIS — I4891 Unspecified atrial fibrillation: Secondary | ICD-10-CM | POA: Insufficient documentation

## 2016-11-19 DIAGNOSIS — F418 Other specified anxiety disorders: Secondary | ICD-10-CM | POA: Diagnosis not present

## 2016-11-19 DIAGNOSIS — E785 Hyperlipidemia, unspecified: Secondary | ICD-10-CM | POA: Insufficient documentation

## 2016-11-19 DIAGNOSIS — I10 Essential (primary) hypertension: Secondary | ICD-10-CM | POA: Diagnosis not present

## 2016-11-19 DIAGNOSIS — Z8546 Personal history of malignant neoplasm of prostate: Secondary | ICD-10-CM | POA: Insufficient documentation

## 2016-11-19 DIAGNOSIS — I252 Old myocardial infarction: Secondary | ICD-10-CM | POA: Insufficient documentation

## 2016-11-19 DIAGNOSIS — K219 Gastro-esophageal reflux disease without esophagitis: Secondary | ICD-10-CM | POA: Diagnosis not present

## 2016-11-19 DIAGNOSIS — I255 Ischemic cardiomyopathy: Secondary | ICD-10-CM | POA: Diagnosis not present

## 2016-11-19 DIAGNOSIS — I2510 Atherosclerotic heart disease of native coronary artery without angina pectoris: Secondary | ICD-10-CM | POA: Diagnosis not present

## 2016-11-19 DIAGNOSIS — Z7901 Long term (current) use of anticoagulants: Secondary | ICD-10-CM | POA: Insufficient documentation

## 2016-11-19 DIAGNOSIS — Z87891 Personal history of nicotine dependence: Secondary | ICD-10-CM | POA: Insufficient documentation

## 2016-11-19 DIAGNOSIS — I491 Atrial premature depolarization: Secondary | ICD-10-CM | POA: Insufficient documentation

## 2016-11-19 DIAGNOSIS — Z951 Presence of aortocoronary bypass graft: Secondary | ICD-10-CM | POA: Diagnosis not present

## 2016-11-19 DIAGNOSIS — N400 Enlarged prostate without lower urinary tract symptoms: Secondary | ICD-10-CM | POA: Insufficient documentation

## 2016-11-19 DIAGNOSIS — D649 Anemia, unspecified: Secondary | ICD-10-CM | POA: Diagnosis not present

## 2016-11-19 DIAGNOSIS — I481 Persistent atrial fibrillation: Secondary | ICD-10-CM

## 2016-11-19 HISTORY — PX: CARDIOVERSION: SHX1299

## 2016-11-19 SURGERY — CARDIOVERSION
Anesthesia: General

## 2016-11-19 MED ORDER — PROPOFOL 10 MG/ML IV BOLUS
INTRAVENOUS | Status: DC | PRN
  Administered 2016-11-19: 80 mg via INTRAVENOUS

## 2016-11-19 MED ORDER — LIDOCAINE 2% (20 MG/ML) 5 ML SYRINGE
INTRAMUSCULAR | Status: DC | PRN
  Administered 2016-11-19: 60 mg via INTRAVENOUS

## 2016-11-19 MED ORDER — SODIUM CHLORIDE 0.9 % IV SOLN
INTRAVENOUS | Status: DC | PRN
  Administered 2016-11-19: 13:00:00 via INTRAVENOUS

## 2016-11-19 NOTE — Anesthesia Postprocedure Evaluation (Addendum)
Anesthesia Post Note  Patient: Carl [name]  Procedure(s) Performed: Procedure(s) (LRB): CARDIOVERSION (N/A)  Patient location during evaluation: PACU Anesthesia Type: General Level of consciousness: awake Pain management: pain level controlled Vital Signs Assessment: post-procedure vital signs reviewed and stable Respiratory status: spontaneous breathing Cardiovascular status: stable Postop Assessment: no signs of nausea or vomiting Anesthetic complications: no        Last Vitals:  Vitals:   11/19/16 1311 11/19/16 1314  BP: (!) 113/56 (!) 129/59  Pulse: 64 (!) 52  Resp: 18 14  Temp:  36.8 C    Last Pain:  Vitals:   11/19/16 1314  TempSrc: Oral   Pain Goal:                 Thedore Pickel JR,JOHN Lisaanne Lawrie

## 2016-11-19 NOTE — Transfer of Care (Signed)
Immediate Anesthesia Transfer of Care Note  Patient: Carl [name]  Procedure(s) Performed: Procedure(s): CARDIOVERSION (N/A)  Patient Location: Endoscopy Unit  Anesthesia Type:General  Level of Consciousness: sedated  Airway & Oxygen Therapy: Patient Spontanous Breathing and Patient connected to nasal cannula oxygen  Post-op Assessment: Report given to RN, Post -op Vital signs reviewed and stable and Patient moving all extremities  Post vital signs: Reviewed and stable  Last Vitals:  Vitals:   11/19/16 1153  BP: (!) 157/90  Pulse: 75  Resp: 17    Last Pain:  Vitals:   11/19/16 1153  TempSrc: Oral         Complications: No apparent anesthesia complications

## 2016-11-19 NOTE — Interval H&P Note (Signed)
History and Physical Interval Note:  11/19/2016 12:46 PM  Carl [name]  has presented today for surgery, with the diagnosis of afib  The various methods of treatment have been discussed with the patient and family. After consideration of risks, benefits and other options for treatment, the patient has consented to  Procedure(s): CARDIOVERSION (N/A) as a surgical intervention .  The patient's history has been reviewed, patient examined, no change in status, stable for surgery.  I have reviewed the patient's chart and labs.  Questions were answered to the patient's satisfaction.     [employer]

## 2016-11-19 NOTE — H&P (View-Only) (Signed)
Primary Care Physician: No PCP Per Patient Referring Physician:Luke [place], [name]   DONAL LYNAM is a 81 y.o. male with a h/o CAD- s/p CABG x 5 in 2006. He has had no further issues from that standpoint. He has had a moderate CM in the past but his echo in Jan 2016 showed his EF was 60-65%. The pt went to an Urgent Care in [facility] 09/03/16 with complaints of increased DOE and orthopnea x 2 weeks. He was felt to be in AF with CHF.[name] His rate was controlled-80's. He was placed on Lasix 20 mg x 7 days and referred to [name], [name] for further evaluation. He was unaware of his HR being irregular. His dyspnea improved on the Lasix but did not completely resolve. He had been a prior pt of Dr. Aundra Dubin and when he saw [name], requested a MD at [facility] office instead of [facility] due to the further distance for [employer]. [name] thought he needed cardioversion when he satisfied the 3 weeks loading of DOAC. He went on to c/o of some blood in the stool with a drop in HGB, [name] was concerned with his paleness and was referred to GI, seen by [name], NP. She felt for now he was stable with minimal blood seen in stool and she was concerned he may not be able to tolerate an colonoscopy, HGB at 10.3 down 2 points since May of 2017.[name]   He continues to feel shortness of breath with exertion, unchanged. Weight is down 6 lbs.No alcohol, no tobacco.No excessive caffeine. His granddaughter is here with him today and helps with his care. They live together.  F/u in afib clinic 4/11. He was referred to GI when his pre cardioversion labs showed a further drop in hgb. DCCV was cancelled and he was sent back to GI. He underwent colonoscopy 3/29 and had 14 polyps removed but many more small ones were left in place. Bx neg for cancer.` He will need to have repeat colonoscopy in the fall. He restarted DOAC 4/2 and will have been back on x 3 weeks 4/23. Will plan for cardioversion 4/25.   Today, he denies symptoms of  palpitations, chest pain, shortness of breath, orthopnea, PND, lower extremity edema, dizziness, presyncope, syncope, or neurologic sequela. The patient is tolerating medications without difficulties and is otherwise without complaint today.   Past Medical History:  Diagnosis Date  . Anemia   . Arthritis    arms  . Bladder calculus   . Chronic anxiety   . Coronary artery disease CARDIOLOGIST-  DR Loralie Champagne   04/04/2005 -- CABG x5  . GERD (gastroesophageal reflux disease)   . History of non-ST elevation myocardial infarction (NSTEMI) 03/30/2005   immediate post-op chest and sob 03-30-2005 Left inguinal hernia repair  . History of prostate cancer urologist-  dr Risa Grill   hx radioactive prostate seed implants 1997--  currently on Lupron injection  . Hyperlipidemia   . Hyperplasia of prostate without lower urinary tract symptoms (LUTS)   . Hypertension   . Ischemic cardiomyopathy    echo (01/13) 45-50% septal akinesis, mild MR/  echo  (01/16) ef 60-65%, mid-apical anteroseptal hypokinesis  . PAC (premature atrial contraction)   . S/P CABG x 5 04/04/2005   LIMA to LAD,  SVG to Ramus,  SVG to OM,  seqSVG to PLV and PDA  . Wears glasses    Past Surgical History:  Procedure Laterality Date  . CARDIAC CATHETERIZATION  04/02/2005   dr Doreatha Lew  Critical three-vessel coronary disease --  Essentially normal left ventricular function , ef 55%  . CARDIOVASCULAR STRESS TEST  05-01-2008  dr Doreatha Lew /  dr harding   Low risk nuclear study w/ no ischemia or scar but flattened septal motion/  ef 64%  . COLONOSCOPY N/A 10/23/2016   Procedure: COLONOSCOPY;  Surgeon: Milus Banister, MD;  Location: [facility];  Service: Endoscopy;  Laterality: N/A;  . CORONARY ARTERY BYPASS GRAFT  04/04/2005   dr  Ricard Dillon   LIMA - LAD,  SVG - Ramus, SVG - OM,  seqSVG - PLV and PDA  . CYSTOSCOPY WITH LITHOLAPAXY N/A 05/07/2016   Procedure: CYSTOSCOPY WITH LITHOLAPAXY;  Surgeon: Rana Snare, MD;  Location: [facility];  Service: Urology;  Laterality: N/A;  . HOLMIUM LASER APPLICATION N/A 55/73/2202   Procedure: HOLMIUM LASER APPLICATION;  Surgeon: Rana Snare, MD;  Location: [facility];  Service: Urology;  Laterality: N/A;  . INGUINAL HERNIA REPAIR Left 03-30-2005  dr Excell Seltzer   incarcerated   . RADIOACTIVE PROSTATE SEED IMPLANTS  1997  . TRANSTHORACIC ECHOCARDIOGRAM  08/24/2014   mid anteroseptal and distal septak hypokinetic,  mild focal basal LVH, ef 60-65%,  grade 1 diastolic dysfunction/  mild MR/  mild LAE/  mild dilated RV with normal RVSP/  trivial TR    Current Outpatient Prescriptions  Medication Sig Dispense Refill  . apixaban (ELIQUIS) 5 MG TABS tablet Take 1 tablet (5 mg total) by mouth 2 (two) times daily. 60 tablet 11  . bicalutamide (CASODEX) 50 MG tablet Take 50 mg by mouth daily with breakfast.   5  . carvedilol (COREG) 12.5 MG tablet TAKE 1 TABLET (12.5 MG TOTAL) BY MOUTH 2 (TWO) TIMES DAILY. 60 tablet 11  . furosemide (LASIX) 20 MG tablet Take 1 tablet (20 mg total) by mouth daily. 7 tablets (Patient taking differently: Take 20 mg by mouth daily. ) 30 tablet 11  . ibuprofen (ADVIL,MOTRIN) 200 MG tablet Take 400 mg by mouth daily as needed for headache or moderate pain.    . naproxen sodium (ANAPROX) 220 MG tablet Take 220 mg by mouth daily as needed (pain).    [name] omeprazole (PRILOSEC) 20 MG capsule TAKE ONE CAPSULE BY MOUTH EVERY DAY 90 capsule 2  . rosuvastatin (CRESTOR) 40 MG tablet Take 1 tablet (40 mg total) by mouth daily. 30 tablet 6  . tamsulosin (FLOMAX) 0.4 MG CAPS capsule Take 0.4 mg by mouth every morning.   11   No current facility-administered medications for this encounter.     No Known Allergies  Social History   Social History  . Marital status: Widowed    Spouse name: N/A  . Number of children: 3  . Years of education: N/A   Occupational History  . retired    Social History Main Topics  . Smoking status: Former Smoker      Years: 2.00    Types: Cigarettes    Quit date: 07/28/1960  . Smokeless tobacco: Never Used  . Alcohol use No  . Drug use: No  . Sexual activity: No   Other Topics Concern  . Not on file   Social History Narrative  . No narrative on file    Family History  Problem Relation Age of Onset  . Heart disease Neg Hx     ROS- All systems are reviewed and negative except as per the HPI above  Physical Exam: Vitals:   11/05/16 1007  BP: 122/76  Pulse:  61  Weight: 187 lb (84.8 kg)  Height: 5\' 10"  (1.778 m)   Wt Readings from Last 3 Encounters:  11/05/16 187 lb (84.8 kg)  10/23/16 190 lb (86.2 kg)  10/17/16 190 lb (86.2 kg)    Labs: Lab Results  Component Value Date   NA 141 11/05/2016   K 4.2 11/05/2016   CL 109 11/05/2016   CO2 24 11/05/2016   GLUCOSE 112 (H) 11/05/2016   BUN 26 (H) 11/05/2016   CREATININE 1.11 11/05/2016   CALCIUM 9.1 11/05/2016   MG 1.7 09/04/2016   No results found for: INR Lab Results  Component Value Date   CHOL 96 (L) 02/21/2016   HDL 32 (L) 02/21/2016   LDLCALC 48 02/21/2016   TRIG 80 02/21/2016     GEN- The patient is well appearing, alert and oriented x 3 today.   Head- normocephalic, atraumatic Eyes-  Sclera clear, conjunctiva pink Ears- hearing intact Oropharynx- clear Neck- supple, no JVP Lymph- no cervical lymphadenopathy Lungs- Clear to ausculation bilaterally, normal work of breathing Heart- Regular rate and rhythm, no murmurs, rubs or gallops, PMI not laterally displaced GI- soft, NT, ND, + BS Extremities- no clubbing, cyanosis, or edema MS- no significant deformity or atrophy Skin- no rash or lesion Psych- euthymic mood, full affect Neuro- strength and sensation are intact  EKG- afib rate controlled at 61 bpm, qrs int 86 ms, qtc 448 ms Echo-Study Conclusions  - Left ventricle: The cavity size was normal. Wall thickness was   increased in a pattern of moderate LVH. Systolic function was   normal. The estimated  ejection fraction was in the range of 50%   to 55%. The study is not technically sufficient to allow   evaluation of LV diastolic function. - Ventricular septum: Septal motion showed paradox. - Mitral valve: There was moderate regurgitation. - Left atrium: The appendage was severely dilated. - Right ventricle: The cavity size was moderately dilated. - Right atrium: The atrium was moderately dilated. - Atrial septum: No defect or patent foramen ovale was identified. - Tricuspid valve: There was moderate regurgitation. - Pulmonary arteries: PA peak pressure: 62 mm Hg (S).  CBC-3/16-RBC 3.52,HGB -9.5(10.3-2/21)HCT 30.9    Assessment and Plan: 1. New onset afib Pt states that he has been back on Eliquis 5 mg without interruption since 4/2, chadsvasc score is at least 4. Scheduled pt for cardioversion  for 4/25  after being back on eliquis uninterrupted x 3 weeks   Pt aware of risk vrs benefit of procedure and wishes to proceed Hold BB am of cardioverion bmet,cbc today  2. Anemia Multiple polys removed Back on DOAC without any visible blood seen by pt in stool Has been taking ibuprofen for pain and recommended to avoid NSAIDS and take tylenol instead   f/u in one week after cardioversion   Butch Penny C. [facility] [name] [place], [place] [address] [phone]  .

## 2016-11-19 NOTE — OR Nursing (Signed)
300 IVF of NS

## 2016-11-19 NOTE — Anesthesia Preprocedure Evaluation (Addendum)
Anesthesia Evaluation  Patient identified by MRN, date of birth, ID band Patient awake    Reviewed: Allergy & Precautions, NPO status , Patient's Chart, lab work & pertinent test results, reviewed documented beta blocker date and time   Airway Mallampati: III  TM Distance: >3 FB Neck ROM: Full    Dental  (+) Partial Upper, Dental Advisory Given, Poor Dentition   Pulmonary former smoker,    Pulmonary exam normal        Cardiovascular hypertension, Pt. on medications and Pt. on home beta blockers + CAD   Rhythm:Irregular Rate:Normal     Neuro/Psych    GI/Hepatic   Endo/Other    Renal/GU      Musculoskeletal   Abdominal Normal abdominal exam  (+)   Peds  Hematology   Anesthesia Other Findings   Reproductive/Obstetrics                            Anesthesia Physical Anesthesia Plan  ASA: III  Anesthesia Plan: General   Post-op Pain Management:    Induction: Intravenous  Airway Management Planned: Natural Airway  Additional Equipment: None  Intra-op Plan:   Post-operative Plan: Extubation in OR  Informed Consent: I have reviewed the patients History and Physical, chart, labs and discussed the procedure including the risks, benefits and alternatives for the proposed anesthesia with the patient or authorized representative who has indicated his/her understanding and acceptance.   Dental advisory given  Plan Discussed with: CRNA, Anesthesiologist and Surgeon  Anesthesia Plan Comments:         Anesthesia Quick Evaluation

## 2016-11-19 NOTE — CV Procedure (Signed)
    Electrical Cardioversion Procedure Note Carl [name] [id number] [birth year]  Procedure: Electrical Cardioversion Indications:  Atrial Fibrillation  Time Out: Verified patient identification, verified procedure,medications/allergies/relevent history reviewed, required imaging and test results available.  Performed  Procedure Details  The patient was NPO after midnight. Anesthesia was administered at the beside  by Dr.Hatchet with propofol.  Cardioversion was performed with synchronized biphasic defibrillation via AP pads with 120 joules.  1 attempt(s) were performed.  The patient converted to normal sinus rhythm. The patient tolerated the procedure well   IMPRESSION:  Successful cardioversion of atrial fibrillation. Occasional PAC's noted.     Candee Furbish 11/19/2016, 1:03 PM

## 2016-11-19 NOTE — Discharge Instructions (Signed)
Electrical Cardioversion, Care After °This sheet gives you information about how to care for yourself after your procedure. Your health care provider may also give you more specific instructions. If you have problems or questions, contact your health care provider. °What can I expect after the procedure? °After the procedure, it is common to have: °· Some redness on the skin where the shocks were given. °Follow these instructions at home: °· Do not drive for 24 hours if you were given a medicine to help you relax (sedative). °· Take over-the-counter and prescription medicines only as told by your health care provider. °· Ask your health care provider how to check your pulse. Check it often. °· Rest for 48 hours after the procedure or as told by your health care provider. °· Avoid or limit your caffeine use as told by your health care provider. °Contact a health care provider if: °· You feel like your heart is beating too quickly or your pulse is not regular. °· You have a serious muscle cramp that does not go away. °Get help right away if: °· You have discomfort in your chest. °· You are dizzy or you feel faint. °· You have trouble breathing or you are short of breath. °· Your speech is slurred. °· You have trouble moving an arm or leg on one side of your body. °· Your fingers or toes turn cold or blue. °This information is not intended to replace advice given to you by your health care provider. Make sure you discuss any questions you have with your health care provider. °Document Released: 05/04/2013 Document Revised: 02/15/2016 Document Reviewed: 01/18/2016 °Elsevier Interactive Patient Education © 2017 Elsevier Inc. ° °

## 2016-11-20 ENCOUNTER — Encounter (HOSPITAL_COMMUNITY): Payer: Self-pay | Admitting: Cardiology

## 2016-11-25 ENCOUNTER — Other Ambulatory Visit: Payer: Self-pay | Admitting: Cardiology

## 2016-11-27 ENCOUNTER — Inpatient Hospital Stay (HOSPITAL_COMMUNITY): Admission: RE | Admit: 2016-11-27 | Payer: Medicare Other | Source: Ambulatory Visit | Admitting: Nurse Practitioner

## 2016-12-09 ENCOUNTER — Ambulatory Visit (HOSPITAL_COMMUNITY)
Admission: RE | Admit: 2016-12-09 | Discharge: 2016-12-09 | Disposition: A | Discharge status: Home / Self Care | Payer: Medicare Other | Source: Ambulatory Visit | Attending: Nurse Practitioner | Admitting: Nurse Practitioner

## 2016-12-09 ENCOUNTER — Encounter (HOSPITAL_COMMUNITY): Payer: Self-pay | Admitting: Nurse Practitioner

## 2016-12-09 VITALS — BP 104/72 | HR 79 | Ht 70.0 in | Wt 169.0 lb

## 2016-12-09 DIAGNOSIS — I4819 Other persistent atrial fibrillation: Secondary | ICD-10-CM

## 2016-12-09 DIAGNOSIS — Z951 Presence of aortocoronary bypass graft: Secondary | ICD-10-CM | POA: Diagnosis not present

## 2016-12-09 DIAGNOSIS — I481 Persistent atrial fibrillation: Secondary | ICD-10-CM | POA: Diagnosis not present

## 2016-12-09 DIAGNOSIS — I4891 Unspecified atrial fibrillation: Secondary | ICD-10-CM | POA: Diagnosis present

## 2016-12-09 DIAGNOSIS — D649 Anemia, unspecified: Secondary | ICD-10-CM | POA: Insufficient documentation

## 2016-12-09 DIAGNOSIS — Z87891 Personal history of nicotine dependence: Secondary | ICD-10-CM | POA: Diagnosis not present

## 2016-12-09 DIAGNOSIS — E785 Hyperlipidemia, unspecified: Secondary | ICD-10-CM | POA: Diagnosis not present

## 2016-12-09 DIAGNOSIS — I2510 Atherosclerotic heart disease of native coronary artery without angina pectoris: Secondary | ICD-10-CM | POA: Insufficient documentation

## 2016-12-09 DIAGNOSIS — I252 Old myocardial infarction: Secondary | ICD-10-CM | POA: Insufficient documentation

## 2016-12-09 DIAGNOSIS — N400 Enlarged prostate without lower urinary tract symptoms: Secondary | ICD-10-CM | POA: Insufficient documentation

## 2016-12-09 DIAGNOSIS — I10 Essential (primary) hypertension: Secondary | ICD-10-CM | POA: Diagnosis not present

## 2016-12-09 DIAGNOSIS — Z923 Personal history of irradiation: Secondary | ICD-10-CM | POA: Diagnosis not present

## 2016-12-09 DIAGNOSIS — Z8546 Personal history of malignant neoplasm of prostate: Secondary | ICD-10-CM | POA: Diagnosis not present

## 2016-12-09 DIAGNOSIS — Z79899 Other long term (current) drug therapy: Secondary | ICD-10-CM | POA: Diagnosis not present

## 2016-12-09 DIAGNOSIS — Z7901 Long term (current) use of anticoagulants: Secondary | ICD-10-CM | POA: Diagnosis not present

## 2016-12-09 DIAGNOSIS — K219 Gastro-esophageal reflux disease without esophagitis: Secondary | ICD-10-CM | POA: Insufficient documentation

## 2016-12-09 NOTE — Progress Notes (Signed)
Primary Care Physician: Patient, No Pcp Per Referring Physician:Luke [place], [place]   Carl [name] is a 81 y.o. male with a h/o CAD- s/p CABG x 5 in 2006. He has had no further issues from that standpoint. He has had a moderate CM in the past but his echo in Jan 2016 showed his EF was 60-65%. The pt went to an Urgent Care in [facility] 09/03/16 with complaints of increased DOE and orthopnea x 2 weeks. He was felt to be in AF with CHF.[name] His rate was controlled-80's. He was placed on Lasix 20 mg x 7 days and referred to [name], [name] for further evaluation. He was unaware of his HR being irregular. His dyspnea improved on the Lasix but did not completely resolve. He had been a prior pt of Dr. Aundra Dubin and when he saw [name], requested a MD at [facility] office instead of [facility] due to the further distance for [employer]. [name] thought he needed cardioversion when he satisfied the 3 weeks loading of DOAC. He went on to c/o of some blood in the stool with a drop in HGB, [name] was concerned with his paleness and was referred to GI, seen by [name], NP. She felt for now he was stable with minimal blood seen in stool and she was concerned he may not be able to tolerate an colonoscopy, HGB at 10.3 down 2 points since May of 2017.[name]   He continues to feel shortness of breath with exertion, unchanged. Weight is down 6 lbs.No alcohol, no tobacco.No excessive caffeine. His granddaughter is here with him today and helps with his care. They live together.  F/u in afib clinic 4/11. He was referred to GI when his pre cardioversion labs showed a further drop in hgb. DCCV was cancelled and he was sent back to GI. He underwent colonoscopy 3/29 and had 14 polyps removed but many more small ones were left in place. Bx neg for cancer.` He will need to have repeat colonoscopy in the fall. He restarted DOAC 4/2 and will have been back on x 3 weeks 4/23. Will plan for cardioversion 4/25.  F/u cardioversion 5/15, He had  successful cardioversion and continues in SR. No further rectal bleeding.No complaints voiced.   Today, he denies symptoms of palpitations, chest pain, shortness of breath, orthopnea, PND, lower extremity edema, dizziness, presyncope, syncope, or neurologic sequela. The patient is tolerating medications without difficulties and is otherwise without complaint today.   Past Medical History:  Diagnosis Date  . Anemia   . Arthritis    arms  . Bladder calculus   . Chronic anxiety   . Coronary artery disease CARDIOLOGIST-  DR Loralie Champagne   04/04/2005 -- CABG x5  . GERD (gastroesophageal reflux disease)   . History of non-ST elevation myocardial infarction (NSTEMI) 03/30/2005   immediate post-op chest and sob 03-30-2005 Left inguinal hernia repair  . History of prostate cancer urologist-  dr Risa Grill   hx radioactive prostate seed implants 1997--  currently on Lupron injection  . Hyperlipidemia   . Hyperplasia of prostate without lower urinary tract symptoms (LUTS)   . Hypertension   . Ischemic cardiomyopathy    echo (01/13) 45-50% septal akinesis, mild MR/  echo  (01/16) ef 60-65%, mid-apical anteroseptal hypokinesis  . PAC (premature atrial contraction)   . S/P CABG x 5 04/04/2005   LIMA to LAD,  SVG to Ramus,  SVG to OM,  seqSVG to PLV and PDA  . Wears glasses  Past Surgical History:  Procedure Laterality Date  . CARDIAC CATHETERIZATION  04/02/2005   dr Doreatha Lew    Critical three-vessel coronary disease --  Essentially normal left ventricular function , ef 55%  . CARDIOVASCULAR STRESS TEST  05-01-2008  dr Doreatha Lew /  dr harding   Low risk nuclear study w/ no ischemia or scar but flattened septal motion/  ef 64%  . CARDIOVERSION N/A 11/19/2016   Procedure: CARDIOVERSION;  Surgeon: Jerline Pain, MD;  Location: [place];  Service: Cardiovascular;  Laterality: N/A;  . COLONOSCOPY N/A 10/23/2016   Procedure: COLONOSCOPY;  Surgeon: Milus Banister, MD;  Location: [facility];   Service: Endoscopy;  Laterality: N/A;  . CORONARY ARTERY BYPASS GRAFT  04/04/2005   dr  Ricard Dillon   LIMA - LAD,  SVG - Ramus, SVG - OM,  seqSVG - PLV and PDA  . CYSTOSCOPY WITH LITHOLAPAXY N/A 05/07/2016   Procedure: CYSTOSCOPY WITH LITHOLAPAXY;  Surgeon: Rana Snare, MD;  Location: [facility];  Service: Urology;  Laterality: N/A;  . HOLMIUM LASER APPLICATION N/A 39/09/90   Procedure: HOLMIUM LASER APPLICATION;  Surgeon: Rana Snare, MD;  Location: [facility];  Service: Urology;  Laterality: N/A;  . INGUINAL HERNIA REPAIR Left 03-30-2005  dr Excell Seltzer   incarcerated   . RADIOACTIVE PROSTATE SEED IMPLANTS  1997  . TRANSTHORACIC ECHOCARDIOGRAM  08/24/2014   mid anteroseptal and distal septak hypokinetic,  mild focal basal LVH, ef 60-65%,  grade 1 diastolic dysfunction/  mild MR/  mild LAE/  mild dilated RV with normal RVSP/  trivial TR    Current Outpatient Prescriptions  Medication Sig Dispense Refill  . acetaminophen (TYLENOL) 500 MG tablet Take 500 mg by mouth every 6 (six) hours as needed.    [name] apixaban (ELIQUIS) 5 MG TABS tablet Take 1 tablet (5 mg total) by mouth 2 (two) times daily. 60 tablet 11  . bicalutamide (CASODEX) 50 MG tablet Take 50 mg by mouth daily with breakfast.   5  . carvedilol (COREG) 12.5 MG tablet TAKE 1 TABLET (12.5 MG TOTAL) BY MOUTH 2 (TWO) TIMES DAILY. 60 tablet 6  . furosemide (LASIX) 20 MG tablet Take 20 mg by mouth daily as needed.    [name] omeprazole (PRILOSEC) 20 MG capsule TAKE ONE CAPSULE BY MOUTH EVERY DAY 90 capsule 2  . rosuvastatin (CRESTOR) 40 MG tablet Take 1 tablet (40 mg total) by mouth daily. 30 tablet 6  . tamsulosin (FLOMAX) 0.4 MG CAPS capsule Take 0.4 mg by mouth every morning.   11   No current facility-administered medications for this encounter.     No Known Allergies  Social History   Social History  . Marital status: Widowed    Spouse name: N/A  . Number of children: 3  . Years of education: N/A    Occupational History  . retired    Social History Main Topics  . Smoking status: Former Smoker    Years: 2.00    Types: Cigarettes    Quit date: 07/28/1960  . Smokeless tobacco: Never Used  . Alcohol use No  . Drug use: No  . Sexual activity: No   Other Topics Concern  . Not on file   Social History Narrative  . No narrative on file    Family History  Problem Relation Age of Onset  . Heart disease Neg Hx     ROS- All systems are reviewed and negative except as per the HPI above  Physical Exam: Vitals:  12/09/16 0916  BP: 104/72  Pulse: 79  Weight: 169 lb (76.7 kg)  Height: 5\' 10"  (1.778 m)   Wt Readings from Last 3 Encounters:  12/09/16 169 lb (76.7 kg)  11/19/16 187 lb (84.8 kg)  11/05/16 187 lb (84.8 kg)    Labs: Lab Results  Component Value Date   NA 141 11/05/2016   K 4.2 11/05/2016   CL 109 11/05/2016   CO2 24 11/05/2016   GLUCOSE 112 (H) 11/05/2016   BUN 26 (H) 11/05/2016   CREATININE 1.11 11/05/2016   CALCIUM 9.1 11/05/2016   MG 1.7 09/04/2016   No results found for: INR Lab Results  Component Value Date   CHOL 96 (L) 02/21/2016   HDL 32 (L) 02/21/2016   LDLCALC 48 02/21/2016   TRIG 80 02/21/2016     GEN- The patient is well appearing, alert and oriented x 3 today.   Head- normocephalic, atraumatic Eyes-  Sclera clear, conjunctiva pink Ears- hearing intact Oropharynx- clear Neck- supple, no JVP Lymph- no cervical lymphadenopathy Lungs- Clear to ausculation bilaterally, normal work of breathing Heart- Regular rate and rhythm, no murmurs, rubs or gallops, PMI not laterally displaced GI- soft, NT, ND, + BS Extremities- no clubbing, cyanosis, or edema MS- no significant deformity or atrophy Skin- no rash or lesion Psych- euthymic mood, full affect Neuro- strength and sensation are intact  EKG- afib rate controlled at 61 bpm, qrs int 86 ms, qtc 448 ms Echo-Study Conclusions  - Left ventricle: The cavity size was normal. Wall  thickness was   increased in a pattern of moderate LVH. Systolic function was   normal. The estimated ejection fraction was in the range of 50%   to 55%. The study is not technically sufficient to allow   evaluation of LV diastolic function. - Ventricular septum: Septal motion showed paradox. - Mitral valve: There was moderate regurgitation. - Left atrium: The appendage was severely dilated. - Right ventricle: The cavity size was moderately dilated. - Right atrium: The atrium was moderately dilated. - Atrial septum: No defect or patent foramen ovale was identified. - Tricuspid valve: There was moderate regurgitation. - Pulmonary arteries: PA peak pressure: 62 mm Hg (S).  CBC-3/16-RBC 3.52,HGB -9.5(10.3-2/21)HCT 30.9    Assessment and Plan: 1. New onset afib Continue Eliquis 5 mg without interruption, chadsvasc score is at least 4. Successful cardioversion lasix as needed Continue carvedilol at 12.5 mg bid  2. Anemia Multiple polys removed Back on DOAC without any visible blood seen by pt in stool Has been taking ibuprofen for pain and recommended to avoid NSAIDS and take tylenol instead  3. CAD Appears stable Requesting a cardiologist at [facility] street to follow Appointment requested   afib clinic as needed  Geroge Baseman. [facility] [address] [place], [place] [address] [phone]  .

## 2016-12-17 ENCOUNTER — Other Ambulatory Visit: Payer: Self-pay | Admitting: Cardiology

## 2016-12-17 ENCOUNTER — Encounter: Payer: Medicare Other | Admitting: Gastroenterology

## 2016-12-26 NOTE — Addendum Note (Signed)
Addendum  created 12/26/16 1304 by Edie Vallandingham, MD   Sign clinical note    

## 2017-01-02 NOTE — Addendum Note (Signed)
Addendum  created 01/02/17 1203 by Lyn Hollingshead, MD   Sign clinical note

## 2017-01-12 ENCOUNTER — Ambulatory Visit: Payer: Medicare Other | Admitting: Gastroenterology

## 2017-02-09 ENCOUNTER — Encounter: Payer: Self-pay | Admitting: Nurse Practitioner

## 2017-02-09 ENCOUNTER — Ambulatory Visit (INDEPENDENT_AMBULATORY_CARE_PROVIDER_SITE_OTHER): Payer: Medicare Other | Admitting: Nurse Practitioner

## 2017-02-09 ENCOUNTER — Encounter (INDEPENDENT_AMBULATORY_CARE_PROVIDER_SITE_OTHER): Payer: Self-pay

## 2017-02-09 VITALS — BP 90/60 | HR 86 | Ht 71.0 in | Wt 172.8 lb

## 2017-02-09 DIAGNOSIS — Z951 Presence of aortocoronary bypass graft: Secondary | ICD-10-CM | POA: Diagnosis not present

## 2017-02-09 DIAGNOSIS — R634 Abnormal weight loss: Secondary | ICD-10-CM | POA: Diagnosis not present

## 2017-02-09 DIAGNOSIS — I255 Ischemic cardiomyopathy: Secondary | ICD-10-CM

## 2017-02-09 DIAGNOSIS — I4819 Other persistent atrial fibrillation: Secondary | ICD-10-CM

## 2017-02-09 DIAGNOSIS — Z79899 Other long term (current) drug therapy: Secondary | ICD-10-CM | POA: Diagnosis not present

## 2017-02-09 DIAGNOSIS — I481 Persistent atrial fibrillation: Secondary | ICD-10-CM

## 2017-02-09 LAB — CBC
Hematocrit: 34.2 % — ABNORMAL LOW (ref 37.5–51.0)
Hemoglobin: 11.4 g/dL — ABNORMAL LOW (ref 13.0–17.7)
MCH: 26 pg — ABNORMAL LOW (ref 26.6–33.0)
MCHC: 33.3 g/dL (ref 31.5–35.7)
MCV: 78 fL — ABNORMAL LOW (ref 79–97)
Platelets: 202 10*3/uL (ref 150–379)
RBC: 4.39 x10E6/uL (ref 4.14–5.80)
RDW: 23 % — ABNORMAL HIGH (ref 12.3–15.4)
WBC: 7.2 10*3/uL (ref 3.4–10.8)

## 2017-02-09 LAB — BASIC METABOLIC PANEL
BUN/Creatinine Ratio: 16 (ref 10–24)
BUN: 19 mg/dL (ref 8–27)
CO2: 27 mmol/L (ref 20–29)
Calcium: 9.9 mg/dL (ref 8.6–10.2)
Chloride: 111 mmol/L — ABNORMAL HIGH (ref 96–106)
Creatinine, Ser: 1.19 mg/dL (ref 0.76–1.27)
GFR calc Af Amer: 67 mL/min/{1.73_m2} (ref >59)
GFR calc non Af Amer: 58 mL/min/{1.73_m2} — ABNORMAL LOW (ref >59)
Glucose: 119 mg/dL — ABNORMAL HIGH (ref 65–99)
Potassium: 4.2 mmol/L (ref 3.5–5.2)
Sodium: 139 mmol/L (ref 134–144)

## 2017-02-09 MED ORDER — CARVEDILOL 12.5 MG PO TABS
6.2500 mg | ORAL_TABLET | Freq: Two times a day (BID) | ORAL | 6 refills | Status: DC
Start: 2017-02-09 — End: 2017-02-16

## 2017-02-09 NOTE — Patient Instructions (Addendum)
We will be checking the following labs today - STAT BMET and CBC, TSH   Medication Instructions:    Continue with your current medicines. BUT  I am cutting the Coreg in half - take just 1/2 a tablet twice a day    Testing/Procedures To Be Arranged:  N/A  Follow-Up:   Let's see what the labs show.     Other Special Instructions:   N/A    If you need a refill on your cardiac medications before your next appointment, please call your pharmacy.   Call the [place] office at [phone] if you have any questions, problems or concerns.

## 2017-02-09 NOTE — Progress Notes (Signed)
CARDIOLOGY OFFICE NOTE  Date:  02/09/2017     Carl [name] Date of Birth: 01-Mar-1936 Medical Record #[id number]  PCP:  Patient, No Pcp Per  Cardiologist:  Carl [name] & Carl [name] (wishes to establish with Carl. Marlou [name] - former patient of Carl. Susa [name] and Carl. Aundra [name])    Chief Complaint  Patient presents with  . Atrial Fibrillation  . Congestive Heart Failure  . Coronary Artery Disease    Follow up visit - to establish with Carl. Marlou [name]    History of Present Illness: Carl [name] is a 81 y.o. male who presents today for a follow up visit. He would like to be followed back here in the [facility]. Former patient of Carl. Susa [name] and Carl. Claris [name]. I last saw him about 6 years ago.   He has a history of CAD- s/p CABG x 5 in 2006.  He has had a moderate CM in the past but his echo in Jan 2016 showed his EF was 60-65%. His other issues are as noted below.   The pt went to an Urgent Care in [facility] 09/03/16 with complaints of increased DOE and orthopnea x 2 weeks. He was found to be in AF with CHF there. His rate was controlled-80's. He was placed on Lasix 20 mg x 7 days and referred here. He was unaware of his HR being irregular. He denies any chest [name]. His dyspnea improved on the Lasix but did not completely resolve.   When seen for follow up in the office by Carl Ransom, PA back in February - he looked pale. He denied any history of GI bleeding or PUD. The plan was to start him on Xarelto (CHADsVASc=5) if his labs including stool hemoccults were OK.  His Hgb was stable but his hemoccults were positive and the pt reported red blood in his stool. He was referred to GI (Carl [name] and Carl [name] saw him 09/17/16). They felt he had only minor bleeding and suggested resuming anticoagulation. If he were to have recurrent bleeding then colonoscopy would be considered. Last seen towards the later part of February. He was doing well - remained in AF with a controlled VR and was on  anticoagulation without issue.  He was then referred to the AF clinic - got cardioverted back to NSR.   Comes in today. Here with his grand daughter in law Carl [name]) and her 2 kids. They have moved in with him.  His wife died about 5 years ago. He has aged considerably since I last saw him 6 years ago. Looks pale. BP soft. He has had his colonoscopy - due to recurrent bleeding - had lots of polyps removed - goes back to see GI next month. No recent lab noted. He remains on Eliquis. Little lightheaded. No syncope. Generalized weakness. Stomach "stays tore up".  No chest [name]. Says he is not short of breath. Very sedentary as a general rule. Sits most of the day. Lost his PCP and will be reestablishing next month.   Past Medical History:  Diagnosis Date  . Anemia   . Arthritis    arms  . Bladder calculus   . Chronic anxiety   . Coronary artery disease CARDIOLOGIST-  Carl [name]   04/04/2005 -- CABG x5  . GERD (gastroesophageal reflux disease)   . History of non-ST elevation myocardial infarction (NSTEMI) 03/30/2005   immediate post-op chest and sob 03-30-2005 Left inguinal hernia repair  . History of prostate cancer urologist-  Carl Carl [name]   hx radioactive prostate seed implants 1997--  currently on Lupron injection  . Hyperlipidemia   . Hyperplasia of prostate without lower urinary tract symptoms (LUTS)   . Hypertension   . Ischemic cardiomyopathy    echo (01/13) 45-50% septal akinesis, mild MR/  echo  (01/16) ef 60-65%, mid-apical anteroseptal hypokinesis  . PAC (premature atrial contraction)   . S/P CABG x 5 04/04/2005   LIMA to LAD,  SVG to Ramus,  SVG to OM,  seqSVG to PLV and PDA  . Wears glasses     Past Surgical History:  Procedure Laterality Date  . CARDIAC CATHETERIZATION  04/02/2005   Carl Carl [name]    Critical three-vessel coronary disease --  Essentially normal left ventricular function , ef 55%  . CARDIOVASCULAR STRESS TEST  05-01-2008  Carl Carl [name] /  Carl Carl [name]   Low  risk nuclear study w/ no ischemia or scar but flattened septal motion/  ef 64%  . CARDIOVERSION N/A 11/19/2016   Procedure: CARDIOVERSION;  Surgeon: Carl Pain, MD;  Location: [facility];  Service: Cardiovascular;  Laterality: N/A;  . COLONOSCOPY N/A 10/23/2016   Procedure: COLONOSCOPY;  Surgeon: Carl Banister, MD;  Location: [facility];  Service: Endoscopy;  Laterality: N/A;  . CORONARY ARTERY BYPASS GRAFT  04/04/2005   Carl  Carl [name]   LIMA - LAD,  SVG - Ramus, SVG - OM,  seqSVG - PLV and PDA  . CYSTOSCOPY WITH LITHOLAPAXY N/A 05/07/2016   Procedure: CYSTOSCOPY WITH LITHOLAPAXY;  Surgeon: Carl Snare, MD;  Location[facility];  Service: Urology;  Laterality: N/A;  . HOLMIUM LASER APPLICATION N/A 97/35/3299   Procedure: HOLMIUM LASER APPLICATION;  Surgeon: Carl Snare, MD;  Location: [facility];  Service: Urology;  Laterality: N/A;  . INGUINAL HERNIA REPAIR Left 03-30-2005  Carl Carl [name]   incarcerated   . RADIOACTIVE PROSTATE SEED IMPLANTS  1997  . TRANSTHORACIC ECHOCARDIOGRAM  08/24/2014   mid anteroseptal and distal septak hypokinetic,  mild focal basal LVH, ef 60-65%,  grade 1 diastolic dysfunction/  mild MR/  mild LAE/  mild dilated RV with normal RVSP/  trivial TR     Medications: Current Meds  Medication Sig  . acetaminophen (TYLENOL) 500 MG tablet Take 500 mg by mouth every 6 (six) hours as needed.  [name] apixaban (ELIQUIS) 5 MG TABS tablet Take 1 tablet (5 mg total) by mouth 2 (two) times daily.  . bicalutamide (CASODEX) 50 MG tablet Take 50 mg by mouth daily with breakfast.   . carvedilol (COREG) 12.5 MG tablet Take 0.5 tablets (6.25 mg total) by mouth 2 (two) times daily with a meal.  . furosemide (LASIX) 20 MG tablet Take 20 mg by mouth daily as needed.  [name] HYDROcodone-acetaminophen (NORCO/VICODIN) 5-325 MG tablet Take 1 tablet by mouth every 6 (six) hours as needed. for [name]  . omeprazole (PRILOSEC) 20 MG capsule TAKE ONE CAPSULE BY MOUTH EVERY  DAY  . rosuvastatin (CRESTOR) 40 MG tablet TAKE 1 TABLET BY MOUTH EVERY DAY  . tamsulosin (FLOMAX) 0.4 MG CAPS capsule Take 0.4 mg by mouth every morning.   . [DISCONTINUED] carvedilol (COREG) 12.5 MG tablet TAKE 1 TABLET (12.5 MG TOTAL) BY MOUTH 2 (TWO) TIMES DAILY.     Allergies: No Known Allergies  Social History: The patient  reports that he quit smoking about 56 years ago. His smoking use included Cigarettes. He quit after 2.00 years of use. He has never used smokeless tobacco. He reports  that he does not drink alcohol or use drugs.   Family History: The patient's family history is not on file.   Review of Systems: Please see the history of present illness.   Otherwise, the review of systems is positive for none.   All other systems are reviewed and negative.   Physical Exam: VS:  BP 90/60 (BP Location: Left Arm, Patient Position: Sitting, Cuff Size: Normal)   Pulse 86   Ht 5\' 11"  (1.803 m)   Wt 172 lb 12.8 oz (78.4 kg)   BMI 24.10 kg/m  .  BMI Body mass index is 24.1 kg/m.  Wt Readings from Last 3 Encounters:  02/09/17 172 lb 12.8 oz (78.4 kg)  12/09/16 169 lb (76.7 kg)  11/19/16 187 lb (84.8 kg)   Repeat BP by me is 85/60.  General: Little despondent. Chronically ill appearing. Quite pale - very sallow. Weight is down 15 pounds since April. He is in no acute distress.   HEENT: Normal.  Neck: Supple, no JVD, carotid bruits, or masses noted.  Cardiac: Regular rate and rhythm. No murmurs, rubs, or gallops. No edema.  Respiratory:  Lungs are fairly clear to auscultation bilaterally with normal work of breathing.  GI: Soft and nontender.  MS: No deformity or atrophy. Gait and ROM intact.  Skin: Warm and dry. Color is normal.  Neuro:  Strength and sensation are intact and no gross focal deficits noted.  Psych: Alert, appropriate and with normal affect.   LABORATORY DATA:  EKG:  EKG is ordered today. This demonstrates NSR with 1st degree AV block.   Lab Results    Component Value Date   WBC 7.9 11/17/2016   HGB 9.2 (L) 11/17/2016   HCT 30.6 (L) 11/17/2016   PLT 193 11/17/2016   GLUCOSE 112 (H) 11/05/2016   CHOL 96 (L) 02/21/2016   TRIG 80 02/21/2016   HDL 32 (L) 02/21/2016   LDLCALC 48 02/21/2016   ALT 23 09/04/2016   AST 27 09/04/2016   NA 141 11/05/2016   K 4.2 11/05/2016   CL 109 11/05/2016   CREATININE 1.11 11/05/2016   BUN 26 (H) 11/05/2016   CO2 24 11/05/2016   TSH 1.68 09/04/2016     BNP (last 3 results) No results for input(s): BNP in the last 8760 hours.  ProBNP (last 3 results) No results for input(s): PROBNP in the last 8760 hours.   Other Studies Reviewed Today:  Echo Study Conclusions from 08/2016  - Left ventricle: The cavity size was normal. Wall thickness was   increased in a pattern of moderate LVH. Systolic function was   normal. The estimated ejection fraction was in the range of 50%   to 55%. The study is not technically sufficient to allow   evaluation of LV diastolic function. - Ventricular septum: Septal motion showed paradox. - Mitral valve: There was moderate regurgitation. - Left atrium: The appendage was severely dilated. - Right ventricle: The cavity size was moderately dilated. - Right atrium: The atrium was moderately dilated. - Atrial septum: No defect or patent foramen ovale was identified. - Tricuspid valve: There was moderate regurgitation. - Pulmonary arteries: PA peak pressure: 62 mm Hg (S).  Procedure: Electrical Cardioversion 10/2016 Indications:  Atrial Fibrillation  Time Out: Verified patient identification, verified procedure,medications/allergies/relevent history reviewed, required imaging and test results available.  Performed  Procedure Details  The patient was NPO after midnight. Anesthesia was administered at the beside  by Carl.Hatchet with propofol.  Cardioversion was performed with synchronized  biphasic defibrillation via AP pads with 120 joules.  1 attempt(s) were  performed.  The patient converted to normal sinus rhythm. The patient tolerated the procedure well   IMPRESSION:  Successful cardioversion of atrial fibrillation. Occasional PAC's noted.     Mark Skains 11/19/2016, 1:03 PM  Assessment/Plan:  1. PAF - s/p cardioversion back in April - holding in NSR today by EKG and exam but clinically does not look well today. I suspect that he is bleeding. He is hypotensive.   2. Ischemic CM - EF normal by most recent echo. BP is low. Weight is down 15 pounds over the past 4 months - this is quite concerning. Cutting Coreg back today.   3. CAD with remote CABG - no active chest [name].   4. HTN - BP quite low here today - cutting Coreg back.   5. HLD - on Crestor  6. CKD - lab today  7. Blood in stool - he has had multiple polyps removed earlier this year. Last HGB was on 9. No recent labs. Lost his prior PCP. He looks quite anemic here today and BP is low. Checking stat labs. Coreg cut back. May end up referring on to the hospital if he is in fact anemic to the point that he needs a transfusion.   8. Prostate cancer - unclear as to the disposition of this.   Current medicines are reviewed with the patient today.  The patient does not have concerns regarding medicines other than what has been noted above.  The following changes have been made:  See above.  Labs/ tests ordered today include:    Orders Placed This Encounter  Procedures  . Basic metabolic panel  . CBC  . TSH  . Hepatic function panel  . EKG 12-Lead     Disposition:   Further disposition pending.   Patient is agreeable to this plan and will call if any problems develop in the interim.   SignedTruitt Merle, NP  02/09/2017 2:15 PM  [facility] [address] [place], [place]  [address] Phone: [phone] Fax: [phone]

## 2017-02-10 LAB — HEPATIC FUNCTION PANEL
ALT: 16 IU/L (ref 0–44)
AST: 14 IU/L (ref 0–40)
Albumin: 4.1 g/dL (ref 3.5–4.8)
Alkaline Phosphatase: 91 IU/L (ref 39–117)
Bilirubin Total: 0.4 mg/dL (ref 0.0–1.2)
Bilirubin, Direct: 0.11 mg/dL (ref 0.00–0.40)
Total Protein: 6.7 g/dL (ref 6.0–8.5)

## 2017-02-10 LAB — TSH: TSH: 1.48 u[IU]/mL (ref 0.450–4.500)

## 2017-02-16 ENCOUNTER — Encounter (INDEPENDENT_AMBULATORY_CARE_PROVIDER_SITE_OTHER): Payer: Self-pay

## 2017-02-16 ENCOUNTER — Encounter: Payer: Self-pay | Admitting: Physician Assistant

## 2017-02-16 ENCOUNTER — Ambulatory Visit (INDEPENDENT_AMBULATORY_CARE_PROVIDER_SITE_OTHER): Payer: Medicare Other | Admitting: Physician Assistant

## 2017-02-16 VITALS — BP 132/86 | HR 96 | Ht 71.0 in | Wt 168.8 lb

## 2017-02-16 DIAGNOSIS — Z951 Presence of aortocoronary bypass graft: Secondary | ICD-10-CM

## 2017-02-16 DIAGNOSIS — I481 Persistent atrial fibrillation: Secondary | ICD-10-CM

## 2017-02-16 DIAGNOSIS — I951 Orthostatic hypotension: Secondary | ICD-10-CM

## 2017-02-16 DIAGNOSIS — I10 Essential (primary) hypertension: Secondary | ICD-10-CM

## 2017-02-16 DIAGNOSIS — I4819 Other persistent atrial fibrillation: Secondary | ICD-10-CM

## 2017-02-16 MED ORDER — CARVEDILOL 3.125 MG PO TABS: 3.1250 mg | ORAL_TABLET | Freq: Two times a day (BID) | ORAL | 3 refills | Status: DC

## 2017-02-16 NOTE — Progress Notes (Signed)
Cardiology Office Note    Date:  02/16/2017   ID:  Carl [name], DOB [birth year], MRN [id number]  PCP:  Carl [name], No Pcp Per  Cardiologist: Dr. Marlou Porch  Chief Complaint  Carl [name] presents with  . Follow-up    History of Present Illness:  Carl [name] is a 81 y.o. male with history of CAD status post CABG 5 in 2006 with prior cardiomyopathy but most recent echo 2016 EF 60-65%.  Carl [name] was found to be in atrial fibrillation and heart failure 08/2016. He was treated at of [facility] urgent care with Lasix and had follow-up with plans to start him on Xarelto because of the Chadsvasc score of 5. He was Hemoccult positive and was referred to GI who felt he only had minor bleeding and suggested resumption of Xarelto. Colonoscopy for recurrent bleeding and had lots of polyps removed.  Carl [name] was seen by Truitt Merle, NP last week. He was very weak. He was in normal sinus rhythm on EKG. He was hypotensive. She cut his Coreg back and ordered labs. Hemoglobin was actually up to 11.4. TSH was normal. Renal function normal.  Carl [name] comes in today accompanied by his granddaughter. He got dizzy weighing at the scale. He gets dizzy every time he takes his Coreg. He says he  might feel low but better but his granddaughter says she hasn't noticed a difference. She says she held his Coreg to 1 day and he wasn't dizzy. He is significantly orthostatic today.  Past Medical History:  Diagnosis Date  . Anemia   . Arthritis    arms  . Bladder calculus   . Chronic anxiety   . Coronary artery disease CARDIOLOGIST-  DR Loralie Champagne   04/04/2005 -- CABG x5  . GERD (gastroesophageal reflux disease)   . History of non-ST elevation myocardial infarction (NSTEMI) 03/30/2005   immediate post-op chest and sob 03-30-2005 Left inguinal hernia repair  . History of prostate cancer urologist-  dr Risa Grill   hx radioactive prostate seed implants 1997--  currently on Lupron injection  . Hyperlipidemia   .  Hyperplasia of prostate without lower urinary tract symptoms (LUTS)   . Hypertension   . Ischemic cardiomyopathy    echo (01/13) 45-50% septal akinesis, mild MR/  echo  (01/16) ef 60-65%, mid-apical anteroseptal hypokinesis  . PAC (premature atrial contraction)   . S/P CABG x 5 04/04/2005   LIMA to LAD,  SVG to Ramus,  SVG to OM,  seqSVG to PLV and PDA  . Wears glasses     Past Surgical History:  Procedure Laterality Date  . CARDIAC CATHETERIZATION  04/02/2005   dr Doreatha Lew    Critical three-vessel coronary disease --  Essentially normal left ventricular function , ef 55%  . CARDIOVASCULAR STRESS TEST  05-01-2008  dr Doreatha Lew /  dr harding   Low risk nuclear study w/ no ischemia or scar but flattened septal motion/  ef 64%  . CARDIOVERSION N/A 11/19/2016   Procedure: CARDIOVERSION;  Surgeon: Jerline Pain, MD;  Location: [facility];  Service: Cardiovascular;  Laterality: N/A;  . COLONOSCOPY N/A 10/23/2016   Procedure: COLONOSCOPY;  Surgeon: Milus Banister, MD;  Location: [facility];  Service: Endoscopy;  Laterality: N/A;  . CORONARY ARTERY BYPASS GRAFT  04/04/2005   dr  Ricard Dillon   LIMA - LAD,  SVG - Ramus, SVG - OM,  seqSVG - PLV and PDA  . CYSTOSCOPY WITH LITHOLAPAXY N/A 05/07/2016   Procedure: CYSTOSCOPY WITH LITHOLAPAXY;  Surgeon: Rana Snare,  MD;  Location: [facility];  Service: Urology;  Laterality: N/A;  . HOLMIUM LASER APPLICATION N/A 93/81/8299   Procedure: HOLMIUM LASER APPLICATION;  Surgeon: Rana Snare, MD;  Location: [facility];  Service: Urology;  Laterality: N/A;  . INGUINAL HERNIA REPAIR Left 03-30-2005  dr Excell Seltzer   incarcerated   . RADIOACTIVE PROSTATE SEED IMPLANTS  1997  . TRANSTHORACIC ECHOCARDIOGRAM  08/24/2014   mid anteroseptal and distal septak hypokinetic,  mild focal basal LVH, ef 60-65%,  grade 1 diastolic dysfunction/  mild MR/  mild LAE/  mild dilated RV with normal RVSP/  trivial TR    Current Medications: Current Meds    Medication Sig  . acetaminophen (TYLENOL) 500 MG tablet Take 500 mg by mouth every 6 (six) hours as needed.  [name] apixaban (ELIQUIS) 5 MG TABS tablet Take 1 tablet (5 mg total) by mouth 2 (two) times daily.  . bicalutamide (CASODEX) 50 MG tablet Take 50 mg by mouth daily with breakfast.   . carvedilol (COREG) 3.125 MG tablet Take 1 tablet (3.125 mg total) by mouth 2 (two) times daily with a meal.  . furosemide (LASIX) 20 MG tablet Take 20 mg by mouth daily as needed.  [name] HYDROcodone-acetaminophen (NORCO/VICODIN) 5-325 MG tablet Take 1 tablet by mouth every 6 (six) hours as needed. for pain  . omeprazole (PRILOSEC) 20 MG capsule TAKE ONE CAPSULE BY MOUTH EVERY DAY  . rosuvastatin (CRESTOR) 40 MG tablet TAKE 1 TABLET BY MOUTH EVERY DAY  . tamsulosin (FLOMAX) 0.4 MG CAPS capsule Take 0.4 mg by mouth every morning.   . [DISCONTINUED] carvedilol (COREG) 12.5 MG tablet Take 0.5 tablets (6.25 mg total) by mouth 2 (two) times daily with a meal.     Allergies:   Carl [name] has no known allergies.   Social History   Social History  . Marital status: Widowed    Spouse name: N/A  . Number of children: 3  . Years of education: N/A   Occupational History  . retired    Social History Main Topics  . Smoking status: Former Smoker    Years: 2.00    Types: Cigarettes    Quit date: 07/28/1960  . Smokeless tobacco: Never Used  . Alcohol use No  . Drug use: No  . Sexual activity: No   Other Topics Concern  . None   Social History Narrative  . None     Family History:  The Carl [name]'s  family history is not on file.   ROS:   Please see the history of present illness.    Review of Systems  Constitution: Negative.  HENT: Negative.   Cardiovascular: Negative.   Respiratory: Negative.   Endocrine: Negative.   Hematologic/Lymphatic: Negative.   Musculoskeletal: Negative.   Gastrointestinal: Positive for diarrhea.  Genitourinary: Negative.   Neurological: Positive for dizziness and loss of  balance.   All other systems reviewed and are negative.   PHYSICAL EXAM:   VS:  BP 132/86 (BP Location: Left Arm, Carl [name] Position: Sitting, Cuff Size: Normal)   Pulse 96   Ht 5\' 11"  (1.803 m)   Wt 168 lb 12.8 oz (76.6 kg)   SpO2 98%   BMI 23.54 kg/m   Physical Exam  GEN: Well nourished, well developed, in no acute distress  Neck: no JVD, carotid bruits, or masses Cardiac:RRR  With some skipping in 1 to 2/6 systolic murmur left sternal border Respiratory:  clear to auscultation bilaterally, normal work of breathing GI: soft, nontender,  nondistended, + BS Ext: without cyanosis, clubbing, or edema, Good distal pulses bilaterally MS: no deformity or atrophy  Skin: warm and dry, no rash Neuro:  Alert and Oriented x 3  Psych: euthymic mood, full affect  Wt Readings from Last 3 Encounters:  02/16/17 168 lb 12.8 oz (76.6 kg)  02/09/17 172 lb 12.8 oz (78.4 kg)  12/09/16 169 lb (76.7 kg)      Studies/Labs Reviewed:   EKG:  EKG is  ordered today.  The ekg ordered today demonstrates Normal sinus rhythm with first-degree AV block and PA-C  Recent Labs: 09/04/2016: Magnesium 1.7 02/09/2017: ALT 16; BUN 19; Creatinine, Ser 1.19; Hemoglobin 11.4; Platelets 202; Potassium 4.2; Sodium 139; TSH 1.480   Lipid Panel    Component Value Date/Time   CHOL 96 (L) 02/21/2016 0830   TRIG 80 02/21/2016 0830   HDL 32 (L) 02/21/2016 0830   CHOLHDL 3.0 02/21/2016 0830   VLDL 16 02/21/2016 0830   LDLCALC 48 02/21/2016 0830    Additional studies/ records that were reviewed today include:  Echo Study Conclusions from 08/2016   - Left ventricle: The cavity size was normal. Wall thickness was   increased in a pattern of moderate LVH. Systolic function was   normal. The estimated ejection fraction was in the range of 50%   to 55%. The study is not technically sufficient to allow   evaluation of LV diastolic function. - Ventricular septum: Septal motion showed paradox. - Mitral valve: There was  moderate regurgitation. - Left atrium: The appendage was severely dilated. - Right ventricle: The cavity size was moderately dilated. - Right atrium: The atrium was moderately dilated. - Atrial septum: No defect or patent foramen ovale was identified. - Tricuspid valve: There was moderate regurgitation. - Pulmonary arteries: PA peak pressure: 62 mm Hg (S).   Procedure: Electrical Cardioversion 10/2016 Indications:  Atrial Fibrillation   Time Out: Verified Carl [name] identification, verified procedure,medications/allergies/relevent history reviewed, required imaging and test results available.  Performed   Procedure Details   The Carl [name] was NPO after midnight. Anesthesia was administered at the beside  by Dr.Hatchet with propofol.  Cardioversion was performed with synchronized biphasic defibrillation via AP pads with 120 joules.  1 attempt(s) were performed.  The Carl [name] converted to normal sinus rhythm. The Carl [name] tolerated the procedure well    IMPRESSION:   Successful cardioversion of atrial fibrillation. Occasional PAC's noted.        Mark Skains 11/19/2016, 1:03 PM    ASSESSMENT:    1. Orthostatic hypotension   2. Persistent atrial fibrillation ([place])   3. Essential hypertension   4. Hx of CABG x 5 '06      PLAN:  In order of problems listed above:   Orthostatic hypotension blood pressure drops from 142/88 lying to 77/53 standing. Carl [name]'s lab last week looked good. He is eating and drinking enough. We'll decrease Coreg to 3.125 mg twice a day. His granddaughter will take his blood pressures lying sitting and standing and stop his Coreg altogether if he remains orthostatic. We'll also order compression hose. Precautions when changing position have been given. I suspect he won't tolerate even low-dose Coreg at this point. Follow-up in 1-2 weeks with Cecille Rubin or myself.  Paroxysmal atrial fibrillation currently in normal sinus rhythm on Eliquis and Hgb stable last week. Hopefully  his heart rate will go fast on low dose or no Coreg at all.  Essential hypertension now severely orthostatic and not tolerating Coreg.  CAD status post CABG in  2006 stable without angina.  Medication Adjustments/Labs and Tests Ordered: Current medicines are reviewed at length with the Carl [name] today.  Concerns regarding medicines are outlined above.  Medication changes, Labs and Tests ordered today are listed in the Carl [name] Instructions below. There are no Carl [name] Instructions on file for this visit.   Sumner Boast, PA-C  02/16/2017 11:54 AM    [facility] [place], [place], [place]  [address] Phone: [phone]; Fax: [phone]

## 2017-02-16 NOTE — Patient Instructions (Signed)
Medication Instructions:  Your physician has recommended you make the following change in your medication:  1-DECREASE Coreg 3.125 mg (1/4 of 12. 5 mg tablet)  by mouth twice daily  Check BPs before taking medication, if BP is low do not take Coreg.  Labwork: NONE  Testing/Procedures: NONE  Follow-Up: Your physician recommends that you schedule a follow-up appointment in: 2 weeks with Truitt Merle NP or Estella Husk PA.  Patient needs to wear compression stockings daily to help with BP.   If you need a refill on your cardiac medications before your next appointment, please call your pharmacy.

## 2017-02-17 ENCOUNTER — Telehealth: Payer: Self-pay | Admitting: Physician Assistant

## 2017-02-17 NOTE — Telephone Encounter (Signed)
Returned pts grand daughter n law, Glo Herring, call, DPR on file. She states that last night there was a pool of blood in the toiled and paper in the trash with blood on it, that she believes came from the pt and the dizziness and all that he's been having, she believes is related, and she was concerned and requesting a stool card to be done. I did advise her that when we checked his hgb a week ago, it was ok, and if he was losing blood, that we would see it in that number, but I would send Ermalinda Barrios, PA-C a message, and get her recommendations.  Please advise!

## 2017-02-17 NOTE — Telephone Encounter (Signed)
New Message  Pt daughter call requesting to speak with RN. Pt states she would like for pt to get a stool sample complete. Pt daughter states she feels pt has not been telling the truth about having blood in stool. She states pt has been c/o dizziness, and high headedness. Pt is also losing weight. Please call back to discuss

## 2017-02-19 ENCOUNTER — Other Ambulatory Visit: Payer: Medicare Other | Admitting: *Deleted

## 2017-02-19 ENCOUNTER — Other Ambulatory Visit: Payer: Self-pay | Admitting: *Deleted

## 2017-02-19 DIAGNOSIS — K921 Melena: Secondary | ICD-10-CM

## 2017-02-19 NOTE — Telephone Encounter (Signed)
S/w pt's grand daughter per (DPR) is aware to bring pt in for repeat cbc.  Will bring pt In today. Appointment made, orders in and linked.

## 2017-02-19 NOTE — Telephone Encounter (Signed)
If bleeding will need CBC rechecked.

## 2017-02-20 LAB — CBC WITH DIFFERENTIAL/PLATELET
Basophils Absolute: 0 10*3/uL (ref 0.0–0.2)
Basos: 0 %
EOS (ABSOLUTE): 0.1 10*3/uL (ref 0.0–0.4)
Eos: 2 %
Hematocrit: 35.5 % — ABNORMAL LOW (ref 37.5–51.0)
Hemoglobin: 11.3 g/dL — ABNORMAL LOW (ref 13.0–17.7)
Immature Grans (Abs): 0 10*3/uL (ref 0.0–0.1)
Immature Granulocytes: 0 %
Lymphocytes Absolute: 2.2 10*3/uL (ref 0.7–3.1)
Lymphs: 28 %
MCH: 26.5 pg — ABNORMAL LOW (ref 26.6–33.0)
MCHC: 31.8 g/dL (ref 31.5–35.7)
MCV: 83 fL (ref 79–97)
Monocytes Absolute: 0.4 10*3/uL (ref 0.1–0.9)
Monocytes: 5 %
Neutrophils Absolute: 5.2 10*3/uL (ref 1.4–7.0)
Neutrophils: 65 %
Platelets: 226 10*3/uL (ref 150–379)
RBC: 4.27 x10E6/uL (ref 4.14–5.80)
RDW: 21.6 % — ABNORMAL HIGH (ref 12.3–15.4)
WBC: 8 10*3/uL (ref 3.4–10.8)

## 2017-02-20 NOTE — Telephone Encounter (Signed)
Called granddaughter and informed her that the provider needs to review lab work before results are released. Informed her that someone will call her on Monday with results. Patient's granddaughter verbalized understanding.

## 2017-02-20 NOTE — Telephone Encounter (Signed)
New message     Pt granddaughter calling for blood work results from yesterday

## 2017-02-24 ENCOUNTER — Ambulatory Visit: Payer: Medicare Other | Admitting: Podiatry

## 2017-03-04 NOTE — Progress Notes (Signed)
Cardiology Office Note   Date:  03/05/2017   ID:  [name], DOB [birth year], MRN [id number]  PCP:  Patient, No Pcp Per  Cardiologist:  Dr. Marlou Porch    Chief Complaint  Patient presents with  . Dizziness      History of Present Illness: GILFORD LARDIZABAL is a 80 y.o. male who presents for follow up of orthostatic hypotension.    He has a history of CAD status post CABG 5 in 2006 with prior cardiomyopathy but most recent echo 2016 EF 60-65%.  Patient was found to be in atrial fibrillation and heart failure 08/2016. He was treated at of [facility] urgent care with Lasix and had follow-up with plans to start him on Xarelto because of the Chadsvasc score of 5. He was Hemoccult positive and was referred to GI who felt he only had minor bleeding and suggested resumption of Xarelto. Colonoscopy for recurrent bleeding and had lots of polyps removed.  Patient was seen by Truitt Merle, NP several weeks ago He was very weak. He was in normal sinus rhythm on EKG. He was hypotensive. She cut his Coreg back and ordered labs. Hemoglobin was actually up to 11.4. TSH was normal. Renal function normal.  Patient was seen a couple weeks ago accompanied by his granddaughter. He got dizzy weighing at the scale. He gets dizzy every time he takes his Coreg. He says he  might feel low but better but his granddaughter says she hasn't noticed a difference. She says she held his Coreg to 1 day and he wasn't dizzy. He was significantly orthostatic 02/16/17.  He was in SR, .coreg decreased to 3.125 BID and due to continued dizziness and low BP coreg has been stopped.  He has not yet rec'd support stockings.  Still dizzy at times but improved.  He is eating well.. No chest pain, some DOE +orthostatic today.  His HR increases with walking as well up to 126.  Has not had lasix in some time.  Past Medical History:  Diagnosis Date  . Anemia   . Arthritis    arms  . Bladder calculus   . Chronic anxiety   .  Coronary artery disease CARDIOLOGIST-  DR Loralie Champagne   04/04/2005 -- CABG x5  . GERD (gastroesophageal reflux disease)   . History of non-ST elevation myocardial infarction (NSTEMI) 03/30/2005   immediate post-op chest and sob 03-30-2005 Left inguinal hernia repair  . History of prostate cancer urologist-  dr Risa Grill   hx radioactive prostate seed implants 1997--  currently on Lupron injection  . Hyperlipidemia   . Hyperplasia of prostate without lower urinary tract symptoms (LUTS)   . Hypertension   . Ischemic cardiomyopathy    echo (01/13) 45-50% septal akinesis, mild MR/  echo  (01/16) ef 60-65%, mid-apical anteroseptal hypokinesis  . PAC (premature atrial contraction)   . S/P CABG x 5 04/04/2005   LIMA to LAD,  SVG to Ramus,  SVG to OM,  seqSVG to PLV and PDA  . Wears glasses     Past Surgical History:  Procedure Laterality Date  . CARDIAC CATHETERIZATION  04/02/2005   dr Doreatha Lew    Critical three-vessel coronary disease --  Essentially normal left ventricular function , ef 55%  . CARDIOVASCULAR STRESS TEST  05-01-2008  dr Doreatha Lew /  dr harding   Low risk nuclear study w/ no ischemia or scar but flattened septal motion/  ef 64%  . CARDIOVERSION N/A 11/19/2016   Procedure: CARDIOVERSION;  Surgeon: Jerline Pain, MD;  Location: [facility] ENDOSCOPY;  Service: Cardiovascular;  Laterality: N/A;  . COLONOSCOPY N/A 10/23/2016   Procedure: COLONOSCOPY;  Surgeon: Milus Banister, MD;  Location: [facility] ENDOSCOPY;  Service: Endoscopy;  Laterality: N/A;  . CORONARY ARTERY BYPASS GRAFT  04/04/2005   dr  Ricard Dillon   LIMA - LAD,  SVG - Ramus, SVG - OM,  seqSVG - PLV and PDA  . CYSTOSCOPY WITH LITHOLAPAXY N/A 05/07/2016   Procedure: CYSTOSCOPY WITH LITHOLAPAXY;  Surgeon: Rana Snare, MD;  Location: [facility];  Service: Urology;  Laterality: N/A;  . HOLMIUM LASER APPLICATION N/A 17/51/0258   Procedure: HOLMIUM LASER APPLICATION;  Surgeon: Rana Snare, MD;  Location: [facility];  Service: Urology;  Laterality: N/A;  . INGUINAL HERNIA REPAIR Left 03-30-2005  dr Excell Seltzer   incarcerated   . RADIOACTIVE PROSTATE SEED IMPLANTS  1997  . TRANSTHORACIC ECHOCARDIOGRAM  08/24/2014   mid anteroseptal and distal septak hypokinetic,  mild focal basal LVH, ef 60-65%,  grade 1 diastolic dysfunction/  mild MR/  mild LAE/  mild dilated RV with normal RVSP/  trivial TR     Current Outpatient Prescriptions  Medication Sig Dispense Refill  . acetaminophen (TYLENOL) 500 MG tablet Take 500 mg by mouth every 6 (six) hours as needed (pain).     [name] apixaban (ELIQUIS) 5 MG TABS tablet Take 1 tablet (5 mg total) by mouth 2 (two) times daily. 60 tablet 11  . bicalutamide (CASODEX) 50 MG tablet Take 50 mg by mouth daily with breakfast.   5  . furosemide (LASIX) 20 MG tablet Take 20 mg by mouth daily as needed (swelling).     [name] HYDROcodone-acetaminophen (NORCO/VICODIN) 5-325 MG tablet Take 1 tablet by mouth every 6 (six) hours as needed. for pain  0  . omeprazole (PRILOSEC) 20 MG capsule TAKE ONE CAPSULE BY MOUTH EVERY DAY 90 capsule 2  . rosuvastatin (CRESTOR) 40 MG tablet TAKE 1 TABLET BY MOUTH EVERY DAY 30 tablet 6   No current facility-administered medications for this visit.     Allergies:   Patient has no known allergies.    Social History:  The patient  reports that he quit smoking about 56 years ago. His smoking use included Cigarettes. He quit after 2.00 years of use. He has never used smokeless tobacco. He reports that he does not drink alcohol or use drugs.   Family History:  The patient's family history includes Cancer in his brother, father, and mother; Heart disease in his mother.    ROS:  General:no colds or fevers, no weight changes Skin:no rashes or ulcers HEENT:no blurred vision, no congestion CV:see HPI PUL:see HPI GI:no diarrhea constipation or melena, no indigestion GU:no hematuria, no dysuria MS:no joint pain, no claudication Neuro:no syncope, no  lightheadedness Endo:no diabetes, no thyroid disease  Wt Readings from Last 3 Encounters:  03/05/17 169 lb 6.4 oz (76.8 kg)  02/16/17 168 lb 12.8 oz (76.6 kg)  02/09/17 172 lb 12.8 oz (78.4 kg)     PHYSICAL EXAM: VS:  BP 124/68   Pulse (!) 106   Ht 5\' 11"  (1.803 m)   Wt 169 lb 6.4 oz (76.8 kg)   SpO2 96%   BMI 23.63 kg/m  , BMI Body mass index is 23.63 kg/m. General:Pleasant affect, NAD Skin:Warm and dry, brisk capillary refill HEENT:normocephalic, sclera clear, mucus membranes moist Neck:supple, no JVD, no bruits  Heart:S1S2 RRR without murmur, gallup, rub or click Lungs:clear to diminished without  rales, rhonchi, or wheezes JME:QAST, non tender, + BS, do not palpate liver spleen or masses Ext:no lower ext edema, 2+ pedal pulses, 2+ radial pulses Neuro:alert and oriented X 3, MAE, follows commands, + facial symmetry  Lying BP 124/82 p 92 Sitting p 113 BP 81.49 felt fuzzy Standing p 77 with BP 85/52 Standing at 3 min P 128 BP 86/46   EKG:  EKG is ordered today. The ekg ordered today demonstrates ST with freq PACs but no acute changes otherwise.    Recent Labs: 09/04/2016: Magnesium 1.7 02/09/2017: ALT 16; BUN 19; Creatinine, Ser 1.19; Potassium 4.2; Sodium 139; TSH 1.480 02/19/2017: Hemoglobin 11.3; Platelets 226    Lipid Panel    Component Value Date/Time   CHOL 96 (L) 02/21/2016 0830   TRIG 80 02/21/2016 0830   HDL 32 (L) 02/21/2016 0830   CHOLHDL 3.0 02/21/2016 0830   VLDL 16 02/21/2016 0830   LDLCALC 48 02/21/2016 0830       Other studies Reviewed: Additional studies/ records that were reviewed today include: . ECHO 09/16/16 Study Conclusions  - Left ventricle: The cavity size was normal. Wall thickness was   increased in a pattern of moderate LVH. Systolic function was   normal. The estimated ejection fraction was in the range of 50%   to 55%. The study is not technically sufficient to allow   evaluation of LV diastolic function. - Ventricular  septum: Septal motion showed paradox. - Mitral valve: There was moderate regurgitation. - Left atrium: The appendage was severely dilated. - Right ventricle: The cavity size was moderately dilated. - Right atrium: The atrium was moderately dilated. - Atrial septum: No defect or patent foramen ovale was identified. - Tricuspid valve: There was moderate regurgitation. - Pulmonary arteries: PA peak pressure: 62 mm Hg (S).   ASSESSMENT AND PLAN:  1. Orthostatic hypotension - will hold flomax for now - encouraged to increase fluids , some salt and support stockings. Would like to see him back in 1 week for orthostatic BP check  If he has problems off flomax may need to resume discussed with Dr. Angelena Form.    2.  Tachycardia with exertion may be due to hypotension but freq PACs will check 48 hour holter to make sure no a fib adding to the picture- if so send to a fib clinic  3.  Persistent AF with successful DCCV on eliquis and stable hgb- hx of GI bleed but GI stated he could resume anticoagulation  4. .   CAD with hx CABG  5. BPH      Current medicines are reviewed with the patient today.  The patient Has no concerns regarding medicines.  The following changes have been made:  See above Labs/ tests ordered today include:see above  Disposition:   FU:  see above  Signed, Cecilie Kicks, NP  03/05/2017 6:10 PM    [facility] [place], [place], [place] Phone: [phone]; Fax: [phone]

## 2017-03-05 ENCOUNTER — Ambulatory Visit (INDEPENDENT_AMBULATORY_CARE_PROVIDER_SITE_OTHER): Payer: Medicare Other | Admitting: Cardiology

## 2017-03-05 ENCOUNTER — Encounter: Payer: Self-pay | Admitting: Cardiology

## 2017-03-05 VITALS — BP 124/68 | HR 106 | Ht 71.0 in | Wt 169.4 lb

## 2017-03-05 DIAGNOSIS — I481 Persistent atrial fibrillation: Secondary | ICD-10-CM

## 2017-03-05 DIAGNOSIS — I4819 Other persistent atrial fibrillation: Secondary | ICD-10-CM

## 2017-03-05 DIAGNOSIS — I951 Orthostatic hypotension: Secondary | ICD-10-CM

## 2017-03-05 DIAGNOSIS — Z951 Presence of aortocoronary bypass graft: Secondary | ICD-10-CM | POA: Diagnosis not present

## 2017-03-05 DIAGNOSIS — I499 Cardiac arrhythmia, unspecified: Secondary | ICD-10-CM | POA: Diagnosis not present

## 2017-03-05 NOTE — Patient Instructions (Addendum)
Medication Instructions:  Your physician has recommended you make the following change in your medication: 1.) Hold on taking the flomax for now.  Labwork: None Ordered   Testing/Procedures: Your physician has recommended that you wear a holter monitor.  AS SOON AS POSSIBLE. Holter monitors are medical devices that record the heart's electrical activity. Doctors most often use these monitors to diagnose arrhythmias. Arrhythmias are problems with the speed or rhythm of the heartbeat. The monitor is a small, portable device. You can wear one while you do your normal daily activities. This is usually used to diagnose what is causing palpitations/syncope (passing out).    Follow-Up: Your physician recommends that you schedule a follow-up appointment in: with Doristine Devoid in the Afib clinic.    Your physician recommends that you schedule a follow-up appointment in: 1 week with Nena Polio, Truitt Merle, or Dr. Marlou Porch for blood pressure check.   Any Other Special Instructions Will Be Listed Below (If Applicable).     If you need a refill on your cardiac medications before your next appointment, please call your pharmacy.

## 2017-03-10 ENCOUNTER — Other Ambulatory Visit: Payer: Self-pay | Admitting: Cardiology

## 2017-03-10 ENCOUNTER — Ambulatory Visit: Payer: Medicare Other | Admitting: Gastroenterology

## 2017-03-10 ENCOUNTER — Ambulatory Visit (INDEPENDENT_AMBULATORY_CARE_PROVIDER_SITE_OTHER): Payer: Medicare Other

## 2017-03-10 DIAGNOSIS — R42 Dizziness and giddiness: Secondary | ICD-10-CM

## 2017-03-10 DIAGNOSIS — I499 Cardiac arrhythmia, unspecified: Secondary | ICD-10-CM

## 2017-03-12 ENCOUNTER — Emergency Department (HOSPITAL_COMMUNITY): Payer: Medicare Other

## 2017-03-12 ENCOUNTER — Inpatient Hospital Stay (HOSPITAL_COMMUNITY)
Admission: EM | Admit: 2017-03-12 | Discharge: 2017-03-17 | DRG: 309 | Disposition: A | Discharge status: Home / Self Care | Payer: Medicare Other | Attending: Internal Medicine | Admitting: Internal Medicine

## 2017-03-12 ENCOUNTER — Encounter: Payer: Self-pay | Admitting: Family Medicine

## 2017-03-12 ENCOUNTER — Encounter (INDEPENDENT_AMBULATORY_CARE_PROVIDER_SITE_OTHER): Payer: Self-pay

## 2017-03-12 ENCOUNTER — Ambulatory Visit (INDEPENDENT_AMBULATORY_CARE_PROVIDER_SITE_OTHER): Payer: Medicare Other | Admitting: Family Medicine

## 2017-03-12 ENCOUNTER — Encounter (HOSPITAL_COMMUNITY): Payer: Self-pay | Admitting: Emergency Medicine

## 2017-03-12 VITALS — BP 102/64 | HR 155 | Temp 97.4°F | Ht 68.5 in | Wt 166.2 lb

## 2017-03-12 DIAGNOSIS — Z951 Presence of aortocoronary bypass graft: Secondary | ICD-10-CM | POA: Diagnosis not present

## 2017-03-12 DIAGNOSIS — R5381 Other malaise: Secondary | ICD-10-CM | POA: Diagnosis present

## 2017-03-12 DIAGNOSIS — N179 Acute kidney failure, unspecified: Secondary | ICD-10-CM | POA: Diagnosis present

## 2017-03-12 DIAGNOSIS — R011 Cardiac murmur, unspecified: Secondary | ICD-10-CM | POA: Diagnosis present

## 2017-03-12 DIAGNOSIS — N189 Chronic kidney disease, unspecified: Secondary | ICD-10-CM | POA: Diagnosis not present

## 2017-03-12 DIAGNOSIS — R911 Solitary pulmonary nodule: Secondary | ICD-10-CM | POA: Diagnosis not present

## 2017-03-12 DIAGNOSIS — I2510 Atherosclerotic heart disease of native coronary artery without angina pectoris: Secondary | ICD-10-CM | POA: Diagnosis present

## 2017-03-12 DIAGNOSIS — I255 Ischemic cardiomyopathy: Secondary | ICD-10-CM | POA: Diagnosis present

## 2017-03-12 DIAGNOSIS — N182 Chronic kidney disease, stage 2 (mild): Secondary | ICD-10-CM

## 2017-03-12 DIAGNOSIS — I4819 Other persistent atrial fibrillation: Secondary | ICD-10-CM | POA: Diagnosis present

## 2017-03-12 DIAGNOSIS — E785 Hyperlipidemia, unspecified: Secondary | ICD-10-CM | POA: Diagnosis present

## 2017-03-12 DIAGNOSIS — I252 Old myocardial infarction: Secondary | ICD-10-CM

## 2017-03-12 DIAGNOSIS — E871 Hypo-osmolality and hyponatremia: Secondary | ICD-10-CM | POA: Diagnosis present

## 2017-03-12 DIAGNOSIS — N3000 Acute cystitis without hematuria: Secondary | ICD-10-CM | POA: Diagnosis not present

## 2017-03-12 DIAGNOSIS — Z79899 Other long term (current) drug therapy: Secondary | ICD-10-CM | POA: Diagnosis not present

## 2017-03-12 DIAGNOSIS — I4891 Unspecified atrial fibrillation: Secondary | ICD-10-CM

## 2017-03-12 DIAGNOSIS — W19XXXA Unspecified fall, initial encounter: Secondary | ICD-10-CM | POA: Diagnosis present

## 2017-03-12 DIAGNOSIS — Z87891 Personal history of nicotine dependence: Secondary | ICD-10-CM | POA: Diagnosis not present

## 2017-03-12 DIAGNOSIS — I481 Persistent atrial fibrillation: Secondary | ICD-10-CM

## 2017-03-12 DIAGNOSIS — B965 Pseudomonas (aeruginosa) (mallei) (pseudomallei) as the cause of diseases classified elsewhere: Secondary | ICD-10-CM | POA: Diagnosis present

## 2017-03-12 DIAGNOSIS — J8410 Pulmonary fibrosis, unspecified: Secondary | ICD-10-CM | POA: Diagnosis present

## 2017-03-12 DIAGNOSIS — I483 Typical atrial flutter: Secondary | ICD-10-CM | POA: Diagnosis present

## 2017-03-12 DIAGNOSIS — R000 Tachycardia, unspecified: Secondary | ICD-10-CM | POA: Diagnosis present

## 2017-03-12 DIAGNOSIS — D72829 Elevated white blood cell count, unspecified: Secondary | ICD-10-CM | POA: Diagnosis present

## 2017-03-12 DIAGNOSIS — Z7901 Long term (current) use of anticoagulants: Secondary | ICD-10-CM | POA: Diagnosis not present

## 2017-03-12 DIAGNOSIS — Z8546 Personal history of malignant neoplasm of prostate: Secondary | ICD-10-CM

## 2017-03-12 DIAGNOSIS — N390 Urinary tract infection, site not specified: Secondary | ICD-10-CM | POA: Diagnosis present

## 2017-03-12 DIAGNOSIS — N183 Chronic kidney disease, stage 3 unspecified: Secondary | ICD-10-CM | POA: Diagnosis present

## 2017-03-12 DIAGNOSIS — E860 Dehydration: Secondary | ICD-10-CM | POA: Diagnosis present

## 2017-03-12 DIAGNOSIS — N289 Disorder of kidney and ureter, unspecified: Secondary | ICD-10-CM | POA: Diagnosis not present

## 2017-03-12 DIAGNOSIS — D649 Anemia, unspecified: Secondary | ICD-10-CM | POA: Diagnosis present

## 2017-03-12 DIAGNOSIS — Z7401 Bed confinement status: Secondary | ICD-10-CM | POA: Diagnosis not present

## 2017-03-12 DIAGNOSIS — Z923 Personal history of irradiation: Secondary | ICD-10-CM

## 2017-03-12 DIAGNOSIS — I4811 Longstanding persistent atrial fibrillation: Secondary | ICD-10-CM | POA: Diagnosis present

## 2017-03-12 DIAGNOSIS — K219 Gastro-esophageal reflux disease without esophagitis: Secondary | ICD-10-CM | POA: Diagnosis present

## 2017-03-12 DIAGNOSIS — I25119 Atherosclerotic heart disease of native coronary artery with unspecified angina pectoris: Secondary | ICD-10-CM | POA: Diagnosis not present

## 2017-03-12 DIAGNOSIS — I129 Hypertensive chronic kidney disease with stage 1 through stage 4 chronic kidney disease, or unspecified chronic kidney disease: Secondary | ICD-10-CM | POA: Diagnosis present

## 2017-03-12 DIAGNOSIS — R531 Weakness: Secondary | ICD-10-CM | POA: Diagnosis not present

## 2017-03-12 DIAGNOSIS — C61 Malignant neoplasm of prostate: Secondary | ICD-10-CM | POA: Diagnosis not present

## 2017-03-12 DIAGNOSIS — I951 Orthostatic hypotension: Secondary | ICD-10-CM | POA: Diagnosis present

## 2017-03-12 DIAGNOSIS — R109 Unspecified abdominal pain: Secondary | ICD-10-CM

## 2017-03-12 DIAGNOSIS — I10 Essential (primary) hypertension: Secondary | ICD-10-CM | POA: Diagnosis not present

## 2017-03-12 DIAGNOSIS — I420 Dilated cardiomyopathy: Secondary | ICD-10-CM | POA: Diagnosis not present

## 2017-03-12 LAB — I-STAT TROPONIN, ED: Troponin i, poc: 0.03 ng/mL (ref 0.00–0.08)

## 2017-03-12 LAB — CBC
HCT: 34.6 % — ABNORMAL LOW (ref 39.0–52.0)
Hemoglobin: 11.3 g/dL — ABNORMAL LOW (ref 13.0–17.0)
MCH: 27.2 pg (ref 26.0–34.0)
MCHC: 32.7 g/dL (ref 30.0–36.0)
MCV: 83.2 fL (ref 78.0–100.0)
Platelets: 176 10*3/uL (ref 150–400)
RBC: 4.16 MIL/uL — ABNORMAL LOW (ref 4.22–5.81)
RDW: 19.1 % — ABNORMAL HIGH (ref 11.5–15.5)
WBC: 11.5 10*3/uL — ABNORMAL HIGH (ref 4.0–10.5)

## 2017-03-12 LAB — BASIC METABOLIC PANEL
Anion gap: 12 (ref 5–15)
BUN: 40 mg/dL — ABNORMAL HIGH (ref 6–20)
CO2: 17 mmol/L — ABNORMAL LOW (ref 22–32)
Calcium: 8.3 mg/dL — ABNORMAL LOW (ref 8.9–10.3)
Chloride: 103 mmol/L (ref 101–111)
Creatinine, Ser: 1.95 mg/dL — ABNORMAL HIGH (ref 0.61–1.24)
GFR calc Af Amer: 36 mL/min — ABNORMAL LOW (ref >60)
GFR calc non Af Amer: 31 mL/min — ABNORMAL LOW (ref >60)
Glucose, Bld: 107 mg/dL — ABNORMAL HIGH (ref 65–99)
Potassium: 4 mmol/L (ref 3.5–5.1)
Sodium: 132 mmol/L — ABNORMAL LOW (ref 135–145)

## 2017-03-12 LAB — URINALYSIS, ROUTINE W REFLEX MICROSCOPIC
Bilirubin Urine: NEGATIVE
Glucose, UA: NEGATIVE mg/dL
Ketones, ur: NEGATIVE mg/dL
Nitrite: NEGATIVE
Protein, ur: 100 mg/dL — AB
Specific Gravity, Urine: 1.015 (ref 1.005–1.030)
pH: 5 (ref 5.0–8.0)

## 2017-03-12 LAB — TROPONIN I
Troponin I: 0.03 ng/mL (ref <0.03)
Troponin I: 0.03 ng/mL (ref <0.03)

## 2017-03-12 LAB — HEMOGLOBIN A1C
Hgb A1c MFr Bld: 5.5 % (ref 4.8–5.6)
Mean Plasma Glucose: 111.15 mg/dL

## 2017-03-12 LAB — MAGNESIUM: Magnesium: 1.8 mg/dL (ref 1.7–2.4)

## 2017-03-12 LAB — MRSA PCR SCREENING: MRSA by PCR: NEGATIVE

## 2017-03-12 LAB — TSH: TSH: 1.445 u[IU]/mL (ref 0.350–4.500)

## 2017-03-12 MED ORDER — ONDANSETRON HCL 4 MG/2ML IJ SOLN: 4.0000 mg | Freq: Four times a day (QID) | INTRAMUSCULAR | Status: DC | PRN

## 2017-03-12 MED ORDER — SODIUM CHLORIDE 0.9% FLUSH: 3.0000 mL | Freq: Two times a day (BID) | INTRAVENOUS | Status: DC

## 2017-03-12 MED ORDER — DILTIAZEM HCL 25 MG/5ML IV SOLN
10.0000 mg | Freq: Once | INTRAVENOUS | Status: AC
  Administered 2017-03-12: 10 mg via INTRAVENOUS
  Filled 2017-03-12: qty 5

## 2017-03-12 MED ORDER — DOCUSATE SODIUM 100 MG PO CAPS
100.0000 mg | ORAL_CAPSULE | Freq: Two times a day (BID) | ORAL | Status: DC
  Administered 2017-03-12 – 2017-03-17 (×10): 100 mg via ORAL
  Filled 2017-03-12 (×10): qty 1

## 2017-03-12 MED ORDER — SODIUM CHLORIDE 0.9 % IV BOLUS (SEPSIS)
1000.0000 mL | Freq: Once | INTRAVENOUS | Status: AC
  Administered 2017-03-12: 1000 mL via INTRAVENOUS

## 2017-03-12 MED ORDER — AMIODARONE HCL IN DEXTROSE 360-4.14 MG/200ML-% IV SOLN
60.0000 mg/h | INTRAVENOUS | Status: AC
  Administered 2017-03-12: 60 mg/h via INTRAVENOUS
  Filled 2017-03-12 (×2): qty 200

## 2017-03-12 MED ORDER — PANTOPRAZOLE SODIUM 40 MG PO TBEC
40.0000 mg | DELAYED_RELEASE_TABLET | Freq: Every day | ORAL | Status: DC
  Administered 2017-03-13 – 2017-03-17 (×5): 40 mg via ORAL
  Filled 2017-03-12 (×5): qty 1

## 2017-03-12 MED ORDER — SODIUM CHLORIDE 0.9% FLUSH: 3.0000 mL | INTRAVENOUS | Status: DC | PRN

## 2017-03-12 MED ORDER — ONDANSETRON HCL 4 MG PO TABS: 4.0000 mg | ORAL_TABLET | Freq: Four times a day (QID) | ORAL | Status: DC | PRN

## 2017-03-12 MED ORDER — SODIUM CHLORIDE 0.9 % IV SOLN: 250.0000 mL | INTRAVENOUS | Status: DC | PRN

## 2017-03-12 MED ORDER — ACETAMINOPHEN 500 MG PO TABS: 500.0000 mg | ORAL_TABLET | Freq: Four times a day (QID) | ORAL | Status: DC | PRN

## 2017-03-12 MED ORDER — APIXABAN 5 MG PO TABS
5.0000 mg | ORAL_TABLET | Freq: Two times a day (BID) | ORAL | Status: DC
  Administered 2017-03-12 – 2017-03-17 (×10): 5 mg via ORAL
  Filled 2017-03-12 (×10): qty 1

## 2017-03-12 MED ORDER — SODIUM CHLORIDE 0.9 % IV SOLN: 250.0000 mL | INTRAVENOUS | Status: DC

## 2017-03-12 MED ORDER — DEXTROSE 5 % IV SOLN
5.0000 mg/h | Freq: Once | INTRAVENOUS | Status: AC
  Administered 2017-03-12: 5 mg/h via INTRAVENOUS
  Filled 2017-03-12: qty 100

## 2017-03-12 MED ORDER — BICALUTAMIDE 50 MG PO TABS
50.0000 mg | ORAL_TABLET | Freq: Every day | ORAL | Status: DC
  Administered 2017-03-12 – 2017-03-16 (×5): 50 mg via ORAL
  Filled 2017-03-12 (×6): qty 1

## 2017-03-12 MED ORDER — DEXTROSE 5 % IV SOLN
5.0000 mg/h | INTRAVENOUS | Status: DC
  Filled 2017-03-12: qty 100

## 2017-03-12 MED ORDER — POLYETHYLENE GLYCOL 3350 17 G PO PACK: 17.0000 g | PACK | Freq: Every day | ORAL | Status: DC | PRN

## 2017-03-12 MED ORDER — SODIUM CHLORIDE 0.9 % IV SOLN: INTRAVENOUS | Status: DC

## 2017-03-12 MED ORDER — SODIUM CHLORIDE 0.9 % IV SOLN: INTRAVENOUS | Status: AC

## 2017-03-12 MED ORDER — AMIODARONE HCL IN DEXTROSE 360-4.14 MG/200ML-% IV SOLN
30.0000 mg/h | INTRAVENOUS | Status: DC
  Administered 2017-03-13 – 2017-03-15 (×6): 30 mg/h via INTRAVENOUS
  Filled 2017-03-12 (×7): qty 200

## 2017-03-12 MED ORDER — ROSUVASTATIN CALCIUM 20 MG PO TABS
40.0000 mg | ORAL_TABLET | Freq: Every day | ORAL | Status: DC
  Administered 2017-03-12 – 2017-03-16 (×5): 40 mg via ORAL
  Filled 2017-03-12 (×4): qty 2

## 2017-03-12 MED ORDER — AMIODARONE LOAD VIA INFUSION
150.0000 mg | Freq: Once | INTRAVENOUS | Status: AC
  Administered 2017-03-12: 150 mg via INTRAVENOUS
  Filled 2017-03-12: qty 83.34

## 2017-03-12 NOTE — ED Notes (Signed)
Doctor at bedside.

## 2017-03-12 NOTE — Progress Notes (Signed)
Carl Rumps, MD Phone: [phone]  Carl [name] is a 81 y.o. male who presents today for new patient visit.  A. fib: Patient notes history of A. fib. Has been followed by cardiology. Recently has been followed by a variety nurse practitioners and PAs. He notes his heart rate goes up if active. He's recently come off of Flomax and Coreg given orthostasis. He has been on a heart monitor and just came off of this today. He notes he has palpitations when he is active. He notes no dyspnea at this time. No chest pain. Heart rate noted to be in the 150s today. He notes over the last 2-3 days he has not felt well. He has difficult time stating what this means.  He notes he has had some stomach discomfort that is described as soreness since having polyps removed during a colonoscopy. He notes rare cough that does cause some soreness. He notes some loose stools. Some nausea. No vomiting. No blood in his stool. He was supposed to see GI this week though has not been able to do that given cardiac issues.  Patient reports 2 nights ago he fell out of a chair. He had no head injury. No loss of consciousness. No other injuries. He reports no issues currently.  History of prostate cancer: Followed by urology in [place]. Recently came off of Flomax and has had increased difficulty with starting and stopping his urination.  Active Ambulatory Problems    Diagnosis Date Noted  . 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 Dyslipidemia 02/04/2011  . Essential hypertension 08/05/2011  . Murmur 08/05/2011  . Cardiomyopathy, ischemic 07/16/2012  . CKD (chronic kidney disease) stage 2, GFR 60-89 ml/min 07/16/2012  . History of prostate cancer 09/04/2016  . Persistent atrial fibrillation ([place]) 09/04/2016  . Acute congestive heart failure ([place]) 09/04/2016  . Blood in stool 09/19/2016  . Anticoagulated 09/19/2016  . Orthostatic hypotension 02/16/2017  . Fall 03/12/2017  . Abdominal soreness 03/12/2017    Resolved Ambulatory Problems    Diagnosis Date Noted  . No Resolved Ambulatory Problems   Past Medical History:  Diagnosis Date  . Anemia   . Arthritis   . Bladder calculus   . Chronic anxiety   . Coronary artery disease CARDIOLOGIST-  DR Loralie Champagne  . GERD (gastroesophageal reflux disease)   . History of non-ST elevation myocardial infarction (NSTEMI) 03/30/2005  . History of prostate cancer urologist-  dr Risa Grill  . Hyperlipidemia   . Hyperplasia of prostate without lower urinary tract symptoms (LUTS)   . Hypertension   . Ischemic cardiomyopathy   . PAC (premature atrial contraction)   . S/P CABG x 5 04/04/2005  . Wears glasses     Family History  Problem Relation Age of Onset  . Heart disease Mother   . Cancer Mother   . Cancer Father   . Cancer Brother     Social History   Social History  . Marital status: Widowed    Spouse name: N/A  . Number of children: 3  . Years of education: N/A   Occupational History  . retired    Social History Main Topics  . Smoking status: Former Smoker    Years: 2.00    Types: Cigarettes    Quit date: 07/28/1960  . Smokeless tobacco: Never Used  . Alcohol use No  . Drug use: No  . Sexual activity: No   Other Topics Concern  . Not on file   Social History  Narrative  . No narrative on file    ROS  General:  Negative for nexplained weight loss, fever Skin: Negative for new or changing mole, sore that won't heal HEENT: Negative for trouble hearing, trouble seeing, ringing in ears, mouth sores, hoarseness, change in voice, dysphagia. CV:  Positive for palpitations, Negative for chest pain, dyspnea, edema Resp: Positive for dyspnea, Negative for cough, hemoptysis GI: Positive for nausea, diarrhea, abdominal pain, vomiting, constipation, melena, hematochezia. GU: Positive for urinary hesitancy, Negative for dysuria, incontinence, hematuria, vaginal or penile discharge, polyuria, sexual difficulty, lumps in testicle or  breasts MSK: Negative for muscle cramps or aches, joint pain or swelling Neuro: Positive for weakness, dizziness, Negative for headaches, numbness, passing out/fainting Psych: Negative for depression, anxiety, memory problems  Objective  Physical Exam Vitals:   03/12/17 1050  BP: 102/64  Pulse: (!) 155  Temp: (!) 97.4 F (36.3 C)  SpO2: 97%    BP Readings from Last 3 Encounters:  03/12/17 102/64  03/05/17 124/68  02/16/17 132/86   Wt Readings from Last 3 Encounters:  03/12/17 166 lb 3.2 oz (75.4 kg)  03/05/17 169 lb 6.4 oz (76.8 kg)  02/16/17 168 lb 12.8 oz (76.6 kg)    Physical Exam  Constitutional: No distress.  HENT:  Head: Normocephalic and atraumatic.  Mouth/Throat: Oropharynx is clear and moist.  Eyes: Pupils are equal, round, and reactive to light. Conjunctivae are normal.  Cardiovascular: Normal heart sounds.  Tachycardia present.   Pulmonary/Chest: Effort normal and breath sounds normal.  Abdominal: Soft. Bowel sounds are normal. He exhibits no distension. There is no tenderness. There is no rebound and no guarding.  Musculoskeletal: He exhibits no edema.  Neurological: He is alert. Gait normal.  Skin: Skin is warm and dry. He is not diaphoretic.  Psychiatric: Mood and affect normal.   EKG: Rate 153, difficult to tell what rhythm it is, no apparent P waves though there is some artifact, could be A. fib versus SVT  Assessment/Plan:   Persistent atrial fibrillation ([place]) Patient with significant tachycardia. Rate of 153. He has not been feeling well the last several days. Blood pressure is borderline low normal. Given significant heart rate elevation and low normal blood pressure I believe he warrants evaluation in the emergency room. We will have him transported by EMS for evaluation to consider further treatment and evaluation. CMA contacted the charge nurse and let them know he was on his way.  Fall Patient reports no injury. Potentially could be related to  his heart rate being elevated and lightheadedness. He had no syncopal event. He's going to be evaluated in the emergency room for his tachycardia. Monitor for further falls.  History of prostate cancer Patient does note some issues with his urinary stream having to strain and having difficulty starting and stopping. He notes he does empty his bladder. He urinates 10 times a day. Discussed that he will need to see his urologist in follow-up once his evaluation for his elevated heart rate is completed.  Abdominal soreness Patient notes intermittent abdominal soreness and loose stools since having his colonoscopy. No blood in his stool. Benign abdominal exam. He is going to the emergency room to be evaluated for his tachycardia. He will need to see GI once tachycardia evaluation is complete for follow-up to evaluate his soreness and loose stools.   Orders Placed This Encounter  Procedures  . EKG 12-Lead    No orders of the defined types were placed in this encounter.  Carl Rumps, MD [place]

## 2017-03-12 NOTE — H&P (Addendum)
History and Physical  Carl [name] PFX:[id number] DOB: 11-16-1935 DOA: 03/12/2017  Referring physician: Rona Ravens PCP: [name], MD  Cardiologist: Dr. Marlou Porch  Chief Complaint: palpatations  HPI: Carl [name] is a 81 y.o. male with significant CAD, s/p 5 vessel CABG, history of prostate cancer, known Atrial fib followed by Dr. Marlou Porch presented to ED from PCP office for palpations.  He has been complaining of generalized weakness and malaise.  Reports occasional heart palpitations when he is active. He was seen by his doctor and was placed on a Holter monitoring. He returned today for a follow-up and was sent here due to a heart rate of 160. Patient received IV fluid prior to arrival and states he feels better. Patient is a poor historian. He does have history of atrial fibrillation, currently on Eliquis.  He denies any fever, headache, lightheadedness, dizziness, chest pain, shortness of breath, abdominal pain, focal numbness/weakness. Did report that he was taken off some blood pressure medication a month ago but no recent medication changes.  No other complaints.    ED course:  He was seen in ED and noted to be tachycardic with HR in 160s irregular and aflutter seen on EKG.  He was started on IV diltiazem with good control of heart rate.  SDU admission was requested.   Severity of Illness: The appropriate patient status for this patient is INPATIENT. Inpatient status is judged to be reasonable and necessary in order to provide the required intensity of service to ensure the patient's safety. The patient's presenting symptoms, physical exam findings, and initial radiographic and laboratory data in the context of their chronic comorbidities is felt to place them at high risk for further clinical deterioration. Furthermore, it is not anticipated that the patient will be medically stable for discharge from the hospital within 2 midnights of admission. The following factors support the  patient status of inpatient.   " The patient's presenting symptoms include palpitations. " The worrisome physical exam findings include tachycardia. " The initial radiographic and laboratory data are worrisome because of Afib RVR on EKG. " The chronic co-morbidities include CAD and others detailed in [facility] below.  * I certify that at the point of admission it is my clinical judgment that the patient will require inpatient hospital care spanning beyond 2 midnights from the point of admission due to high intensity of service, high risk for further deterioration and high frequency of surveillance required.*  Review of Systems: All systems reviewed and apart from history of presenting illness, are negative.  Past Medical History:  Diagnosis Date  . Anemia   . Arthritis    arms  . Bladder calculus   . Chronic anxiety   . Coronary artery disease CARDIOLOGIST-  DR Loralie Champagne   04/04/2005 -- CABG x5  . GERD (gastroesophageal reflux disease)   . History of non-ST elevation myocardial infarction (NSTEMI) 03/30/2005   immediate post-op chest and sob 03-30-2005 Left inguinal hernia repair  . History of prostate cancer urologist-  dr Risa Grill   hx radioactive prostate seed implants 1997--  currently on Lupron injection  . Hyperlipidemia   . Hyperplasia of prostate without lower urinary tract symptoms (LUTS)   . Hypertension   . Ischemic cardiomyopathy    echo (01/13) 45-50% septal akinesis, mild MR/  echo  (01/16) ef 60-65%, mid-apical anteroseptal hypokinesis  . PAC (premature atrial contraction)   . S/P CABG x 5 04/04/2005   LIMA to LAD,  SVG to Ramus,  SVG  to OM,  seqSVG to PLV and PDA  . Wears glasses    Past Surgical History:  Procedure Laterality Date  . CARDIAC CATHETERIZATION  04/02/2005   dr Doreatha Lew    Critical three-vessel coronary disease --  Essentially normal left ventricular function , ef 55%  . CARDIOVASCULAR STRESS TEST  05-01-2008  dr Doreatha Lew /  dr harding   Low risk  nuclear study w/ no ischemia or scar but flattened septal motion/  ef 64%  . CARDIOVERSION N/A 11/19/2016   Procedure: CARDIOVERSION;  Surgeon: Jerline Pain, MD;  Location: [facility];  Service: Cardiovascular;  Laterality: N/A;  . COLONOSCOPY N/A 10/23/2016   Procedure: COLONOSCOPY;  Surgeon: Milus Banister, MD;  Location: [facility];  Service: Endoscopy;  Laterality: N/A;  . CORONARY ARTERY BYPASS GRAFT  04/04/2005   dr  Ricard Dillon   LIMA - LAD,  SVG - Ramus, SVG - OM,  seqSVG - PLV and PDA  . CYSTOSCOPY WITH LITHOLAPAXY N/A 05/07/2016   Procedure: CYSTOSCOPY WITH LITHOLAPAXY;  Surgeon: Rana Snare, MD;  Location: [facility];  Service: Urology;  Laterality: N/A;  . HOLMIUM LASER APPLICATION N/A 81/44/8185   Procedure: HOLMIUM LASER APPLICATION;  Surgeon: Rana Snare, MD;  Location: [facility];  Service: Urology;  Laterality: N/A;  . INGUINAL HERNIA REPAIR Left 03-30-2005  dr Excell Seltzer   incarcerated   . RADIOACTIVE PROSTATE SEED IMPLANTS  1997  . TRANSTHORACIC ECHOCARDIOGRAM  08/24/2014   mid anteroseptal and distal septak hypokinetic,  mild focal basal LVH, ef 60-65%,  grade 1 diastolic dysfunction/  mild MR/  mild LAE/  mild dilated RV with normal RVSP/  trivial TR   Social History:  reports that he quit smoking about 56 years ago. His smoking use included Cigarettes. He quit after 2.00 years of use. He has never used smokeless tobacco. He reports that he does not drink alcohol or use drugs.  No Known Allergies  Family History  Problem Relation Age of Onset  . Heart disease Mother   . Cancer Mother   . Cancer Father   . Cancer Brother     Prior to Admission medications   Medication Sig Start Date End Date Taking? Authorizing Provider  acetaminophen (TYLENOL) 500 MG tablet Take 500 mg by mouth every 6 (six) hours as needed (for pain).    Yes [provider]  apixaban (ELIQUIS) 5 MG TABS tablet Take 1 tablet (5 mg total) by mouth 2 (two)  times daily. 09/19/16  Yes Erlene Quan, PA-C  bicalutamide (CASODEX) 50 MG tablet Take 50 mg by mouth daily with breakfast.  10/22/15  Yes [provider]  furosemide (LASIX) 20 MG tablet Take 20 mg by mouth daily as needed for edema.    Yes [provider]  omeprazole (PRILOSEC) 20 MG capsule TAKE ONE CAPSULE BY MOUTH EVERY DAY Patient taking differently: Take 20 mg by moiuth once a day 11/25/16  Yes Larey Dresser, MD  rosuvastatin (CRESTOR) 40 MG tablet TAKE 1 TABLET BY MOUTH EVERY DAY Patient taking differently: Take 40 mg by mouth once a day 12/17/16  Yes Sherran Needs, NP   Physical Exam: Vitals:   03/12/17 1415 03/12/17 1430 03/12/17 1445 03/12/17 1500  BP: 104/65 105/60 (!) 103/59 108/63  Pulse: 77 76 76 76  Resp: 18 19 16 16   Temp:      TempSrc:      SpO2: 98% 98% 99% 99%  Weight:  Height:        General exam: Moderately built and nourished patient, lying comfortably supine on the gurney in no obvious distress.  Head, eyes and ENT: Nontraumatic and normocephalic. Pupils equally reacting to light and accommodation. Oral mucosa moist.  Neck: Supple. No JVD, carotid bruit or thyromegaly.  Lymphatics: No lymphadenopathy.  Respiratory system: Clear to auscultation. No increased work of breathing.  Cardiovascular system: S1 and S2 heard, tachycardic and irregular. No JVD, murmurs, gallops, clicks or pedal edema.  Gastrointestinal system: Abdomen is nondistended, soft and nontender. Normal bowel sounds heard. No organomegaly or masses appreciated.  Central nervous system: Alert and oriented. No focal neurological deficits.  Extremities: Symmetric 5 x 5 power. Peripheral pulses symmetrically felt.   Skin: No rashes or acute findings.  Musculoskeletal system: Negative exam.  Psychiatry: Pleasant and cooperative.  Labs on Admission:  Basic Metabolic Panel:  Recent Labs Lab 03/12/17 1220  NA 132*  K 4.0  CL 103  CO2 17*  GLUCOSE 107*  BUN  40*  CREATININE 1.95*  CALCIUM 8.3*  MG 1.8   Liver Function Tests: No results for input(s): AST, ALT, ALKPHOS, BILITOT, PROT, ALBUMIN in the last 168 hours. No results for input(s): LIPASE, AMYLASE in the last 168 hours. No results for input(s): AMMONIA in the last 168 hours. CBC:  Recent Labs Lab 03/12/17 1220  WBC 11.5*  HGB 11.3*  HCT 34.6*  MCV 83.2  PLT 176   Cardiac Enzymes: No results for input(s): CKTOTAL, CKMB, CKMBINDEX, TROPONINI in the last 168 hours.  BNP (last 3 results) No results for input(s): PROBNP in the last 8760 hours. CBG: No results for input(s): GLUCAP in the last 168 hours.  Radiological Exams on Admission: Dg Chest 2 View  Result Date: 03/12/2017 CLINICAL DATA:  Generalize weakness. Atrial fibrillation with tachycardia. EXAM: CHEST  2 VIEW COMPARISON:  04/08/2005 and previous FINDINGS: Previous median sternotomy and CABG. Heart size is normal. There is aortic atherosclerosis. The pulmonary vascularity is normal. No infiltrate, collapse or effusion. There is a 7 mm nodule in the left mid lung that looks dense and is most consistent with a granuloma, but I cannot identify this on any of the previous chest radiographs as distant as 04/02/2005. IMPRESSION: No active cardiopulmonary disease.  No evidence of heart failure. Aortic atherosclerosis. Newly seen 7 mm nodule in the left mid lung. This appears well circumscribed and is dense, most consistent with a granuloma. However, I cannot define this on any of the images from 2006. Even given that, the strong likelihood is that this is benign. This could be followed on subsequent radiographs. Electronically Signed   By: Nelson Chimes M.D.   On: 03/12/2017 14:01    EKG: Independently reviewed.   Assessment/Plan Principal Problem:   Atrial fibrillation with RVR (HCC) Active Problems:   Cardiomyopathy, ischemic   CKD (chronic kidney disease) stage 2, GFR 60-89 ml/min   Persistent atrial fibrillation (HCC)    Anticoagulated   Orthostatic hypotension   Generalized weakness   Leukocytosis   Hyponatremia   Hx of CABG x 5 '06   Dyslipidemia   Essential hypertension   Murmur   History of prostate cancer  1. Afib with RVR - He has been started on IV diltiazem with good improvement in heart rate control, will continue and admit to SDU.  I have requested a cardiology consultation.  I have ordered a repeat EKG in AM.  I ordered TSH, Mg, Phos.  I reviewed his recent echocardiogram  which shows preserved EF with diastolic dysfunction.  Monitor on continuous telemetry.  Resume full anticoagulation with apixaban.  2. CAD s/p CABG - no complaints of chest pain, troponin normal. Follow clinically.  3. Generalized weakness - suspect multifocal but may improve with better control of heart rate. I have asked for PT/OT eval when medically stabilized.  Fall precautions recommended.  4. Hyponatremia - hold lasix for now, follow CMP. Gently hydrate with NS.  5. Dyslipidemia - resume home rosuvastatin 40 mg and check lipid panel in AM.  6. Acute on CKD stage 2/3 - pt appears mildly dehydrated, will gently hydrate and follow creatinine, will monitor closely with serial CMP testing.  7. Orthostatic hypotension - check orthostatic vital signs.  8. Leukocytosis - check urinalysis, blood cultures, chest xray clear.    DVT Prophylaxis: apixaban Code Status: FULL   Family Communication: bedside  Disposition Plan: TBD   Time spent: 25 mins  Irwin Brakeman, MD [facility] Pager [phone]  If 7PM-7AM, please contact night-coverage [email or web address] Password [email or web address] 03/12/2017, 3:59 PM

## 2017-03-12 NOTE — Assessment & Plan Note (Signed)
Patient with significant tachycardia. Rate of 153. He has not been feeling well the last several days. Blood pressure is borderline low normal. Given significant heart rate elevation and low normal blood pressure I believe he warrants evaluation in the emergency room. We will have him transported by EMS for evaluation to consider further treatment and evaluation. CMA contacted the charge nurse and let them know he was on his way.

## 2017-03-12 NOTE — Consult Note (Addendum)
Cardiology Consult    Patient ID: Carl [name] MRN: [id number], DOB/AGE: January 09, 1936   Admit date: 03/12/2017 Date of Consult: 03/12/2017  Primary Physician: [name], MD Reason for Consult: Atrial Fibrillation Primary Cardiologist: Dr. Marlou Porch Requesting Provider: Dr. Wynetta Emery  Patient Profile    Carl [name] is a 81 y.o. male with past medical history of CAD (s/p CABG in 2006 with LIMA-LAD, SVG-Ramus, SVG-OM, seqSVG-PLV-PDA), PAF (on Eliquis, s/p DCCV in 10/2016), HTN, and HLD who is being seen today for the evaluation of atrial fibrillation at the request of Dr. Wynetta Emery.   History of Present Illness   He has been followed closely in the office over the past several months as he was experiencing hypotension and dizziness while on BB therapy. Coreg was decreased to 3.125mg  BID in 01/2017 but at the time of his recent office visit on 03/05/2017 this had been discontinued. He reported continued dizziness and soft BP's with HR in the low-100's at that time. He had been positive for orthostatic hypotension at prior visits but had not yet started to utilize compression stockings. Flomax was discontinued at his visit.  He was evaluated at his PCP's office earlier today and was noted to have a HR in the 150's. He also noted abdominal pain and loose stools, therefore EMS was contacted and he was transported to [facility] for further evaluation.   In talking with the patient and his family today, he has experienced worsening weakness and a decreased appetite for the past 3 days. He denies any palpitations, chest pain, dyspnea, orthopnea, PND, or lower extremity edema.   The patient reports his dizziness improved following discontinuation of Flomax but he has experienced difficulty urinating. Does not wear compression stockings regularly.   Initial labs show Na+ of 132, creatinine 1.95 (previously 1.19 in 01/2017), K+ 4.0, WBC 11.5, Hgb 11.3, platelets 176. CXR shows no active  cardiopulmonary disease but a 7 mm nodule is noted in the left mid-lung, thought to be most consistent with a granuloma. EKG shows atrial flutter with HR of 121.   He has been started on IV Diltiazem with HR currently in the 70's - 80's. He denies any symptoms at this time. He does report missing his AM doses of Eliquis over the past 2 days due to "not feeling well".   Past Medical History   Past Medical History:  Diagnosis Date  . Anemia   . Arthritis    arms  . Bladder calculus   . Chronic anxiety   . Coronary artery disease CARDIOLOGIST-  DR Loralie Champagne   04/04/2005 -- CABG x5  . GERD (gastroesophageal reflux disease)   . History of non-ST elevation myocardial infarction (NSTEMI) 03/30/2005   immediate post-op chest and sob 03-30-2005 Left inguinal hernia repair  . History of prostate cancer urologist-  dr Risa Grill   hx radioactive prostate seed implants 1997--  currently on Lupron injection  . Hyperlipidemia   . Hyperplasia of prostate without lower urinary tract symptoms (LUTS)   . Hypertension   . Ischemic cardiomyopathy    echo (01/13) 45-50% septal akinesis, mild MR/  echo  (01/16) ef 60-65%, mid-apical anteroseptal hypokinesis  . PAC (premature atrial contraction)   . S/P CABG x 5 04/04/2005   LIMA to LAD,  SVG to Ramus,  SVG to OM,  seqSVG to PLV and PDA  . Wears glasses     Past Surgical History:  Procedure Laterality Date  . CARDIAC CATHETERIZATION  04/02/2005  dr Doreatha Lew    Critical three-vessel coronary disease --  Essentially normal left ventricular function , ef 55%  . CARDIOVASCULAR STRESS TEST  05-01-2008  dr Doreatha Lew /  dr harding   Low risk nuclear study w/ no ischemia or scar but flattened septal motion/  ef 64%  . CARDIOVERSION N/A 11/19/2016   Procedure: CARDIOVERSION;  Surgeon: Jerline Pain, MD;  Location: [place];  Service: Cardiovascular;  Laterality: N/A;  . COLONOSCOPY N/A 10/23/2016   Procedure: COLONOSCOPY;  Surgeon: Milus Banister, MD;   Location: [facility];  Service: Endoscopy;  Laterality: N/A;  . CORONARY ARTERY BYPASS GRAFT  04/04/2005   dr  Ricard Dillon   LIMA - LAD,  SVG - Ramus, SVG - OM,  seqSVG - PLV and PDA  . CYSTOSCOPY WITH LITHOLAPAXY N/A 05/07/2016   Procedure: CYSTOSCOPY WITH LITHOLAPAXY;  Surgeon: Rana Snare, MD;  Location: [facility];  Service: Urology;  Laterality: N/A;  . HOLMIUM LASER APPLICATION N/A 49/70/2637   Procedure: HOLMIUM LASER APPLICATION;  Surgeon: Rana Snare, MD;  Location: [facility];  Service: Urology;  Laterality: N/A;  . INGUINAL HERNIA REPAIR Left 03-30-2005  dr Excell Seltzer   incarcerated   . RADIOACTIVE PROSTATE SEED IMPLANTS  1997  . TRANSTHORACIC ECHOCARDIOGRAM  08/24/2014   mid anteroseptal and distal septak hypokinetic,  mild focal basal LVH, ef 60-65%,  grade 1 diastolic dysfunction/  mild MR/  mild LAE/  mild dilated RV with normal RVSP/  trivial TR     Allergies  No Known Allergies  Inpatient Medications      Family History    Family History  Problem Relation Age of Onset  . Heart disease Mother   . Cancer Mother   . Cancer Father   . Cancer Brother     Social History    Social History   Social History  . Marital status: Widowed    Spouse name: N/A  . Number of children: 3  . Years of education: N/A   Occupational History  . retired    Social History Main Topics  . Smoking status: Former Smoker    Years: 2.00    Types: Cigarettes    Quit date: 07/28/1960  . Smokeless tobacco: Never Used  . Alcohol use No  . Drug use: No  . Sexual activity: No   Other Topics Concern  . Not on file   Social History Narrative  . No narrative on file     Review of Systems    General:  No chills, fever, night sweats or weight changes. Positive for weakness and decreased appetite.  Cardiovascular:  No chest pain, dyspnea on exertion, edema, orthopnea, palpitations, paroxysmal nocturnal dyspnea. Dermatological: No rash,  lesions/masses Respiratory: No cough, dyspnea Urologic: No hematuria, dysuria Abdominal:   No nausea, vomiting, diarrhea, bright red blood per rectum, melena, or hematemesis. Positive for abdominal pain.  Neurologic:  No visual changes, wkns, changes in mental status. All other systems reviewed and are otherwise negative except as noted above.  Physical Exam    Blood pressure 108/63, pulse 76, temperature 98.4 F (36.9 C), temperature source Oral, resp. rate 16, height 5\' 8"  (1.727 m), weight 160 lb (72.6 kg), SpO2 99 %.  General: Pleasant elderly Caucasian male appearing in NAD Psych: Normal affect. Neuro: Alert and oriented X 3. Moves all extremities spontaneously. HEENT: Normal  Neck: Supple without bruits or JVD. Lungs:  Resp regular and unlabored, CTA without wheezing or rales. Heart: Irregularly irregular no  s3, s4, or murmurs. Abdomen: Soft, non-tender, non-distended, BS + x 4.  Extremities: No clubbing, cyanosis or edema. DP/PT/Radials 2+ and equal bilaterally.  Labs    Troponin (Point of Care Test) No results for input(s): TROPIPOC in the last 72 hours. No results for input(s): CKTOTAL, CKMB, TROPONINI in the last 72 hours. Lab Results  Component Value Date   WBC 11.5 (H) 03/12/2017   HGB 11.3 (L) 03/12/2017   HCT 34.6 (L) 03/12/2017   MCV 83.2 03/12/2017   PLT 176 03/12/2017    Recent Labs Lab 03/12/17 1220  NA 132*  K 4.0  CL 103  CO2 17*  BUN 40*  CREATININE 1.95*  CALCIUM 8.3*  GLUCOSE 107*   Lab Results  Component Value Date   CHOL 96 (L) 02/21/2016   HDL 32 (L) 02/21/2016   LDLCALC 48 02/21/2016   TRIG 80 02/21/2016   No results found for: [facility]   Radiology Studies    Dg Chest 2 View  Result Date: 03/12/2017 CLINICAL DATA:  Generalize weakness. Atrial fibrillation with tachycardia. EXAM: CHEST  2 VIEW COMPARISON:  04/08/2005 and previous FINDINGS: Previous median sternotomy and CABG. Heart size is normal. There is aortic atherosclerosis. The  pulmonary vascularity is normal. No infiltrate, collapse or effusion. There is a 7 mm nodule in the left mid lung that looks dense and is most consistent with a granuloma, but I cannot identify this on any of the previous chest radiographs as distant as 04/02/2005. IMPRESSION: No active cardiopulmonary disease.  No evidence of heart failure. Aortic atherosclerosis. Newly seen 7 mm nodule in the left mid lung. This appears well circumscribed and is dense, most consistent with a granuloma. However, I cannot define this on any of the images from 2006. Even given that, the strong likelihood is that this is benign. This could be followed on subsequent radiographs. Electronically Signed   By:  Chimes M.D.   On: 03/12/2017 14:01    EKG & Cardiac Imaging    EKG: Atrial flutter with HR of 121.  - Personally Reviewed  Echocardiogram: 09/16/2016 Study Conclusions  - Left ventricle: The cavity size was normal. Wall thickness was   increased in a pattern of moderate LVH. Systolic function was   normal. The estimated ejection fraction was in the range of 50%   to 55%. The study is not technically sufficient to allow   evaluation of LV diastolic function. - Ventricular septum: Septal motion showed paradox. - Mitral valve: There was moderate regurgitation. - Left atrium: The appendage was severely dilated. - Right ventricle: The cavity size was moderately dilated. - Right atrium: The atrium was moderately dilated. - Atrial septum: No defect or patent foramen ovale was identified. - Tricuspid valve: There was moderate regurgitation. - Pulmonary arteries: PA peak pressure: 62 mm Hg (S).  Assessment & Plan    1. Paroxysmal Atrial Flutter with RVR - initially diagnosed with atrial flutter in 10/2016 with a DCCV performed at that time. No known recurrence since, although his office EKG on 03/05/2017 is concerning for atrial flutter. He presented with worsening weakness and fatigue but denies any  palpitations, chest pain, or dyspnea.  - HR was initially elevated in the 150's, but now improved into the 70's while on IV Cardizem.  - initial labs show Na+ of 132, creatinine 1.95 (previously 1.19 in 01/2017), K+ 4.0, WBC 11.5, Hgb 11.3, platelets 176. TSH and Mg are pending.  - he has been unable to tolerate even low-dose BB therapy  with Coreg 3.125mg  BID. Currently on IV Cardizem but unable to tolerate this with activity secondary to dizziness and orthostatic hypotension. He remains on Eliquis 5mg  BID for anticoagulation but missed his past two morning doses and is therefore not a candidate for DCCV at this time. Possibly consider initiation of antiarrhythmic therapy with Amiodarone since rate-control options are limited.   2. CAD - s/p CABG in 2006 with LIMA-LAD, SVG-Ramus, SVG-OM, and seqSVG-PLV-PDA - he denies any recurrent chest pain or dyspnea on exertion.  - continue statin. No ASA secondary to need for Eliquis  3. Orthostatic Hypotension - has been a recurrent problem for the patient. Repeat orthostatics are pending.  - receiving IVF. BB therapy was previously discontinued as an outpatient.   4. Acute on Chronic Stage 3 CKD - baseline creatinine 1.1 - 1.2, elevated to 1.95 this admission.  - receiving IVF. Consider bladder scan to check for urinary retention and resumption of lower dose Flomax or Finasteride.   5. Lung Nodule - CXR shows a 7 mm nodule in the left mid-lung, thought to be most consistent with a granuloma.  Signed, Erma Heritage, PA-C 03/12/2017, 3:24 PM Pager: [phone]  The patient was seen, examined and discussed with Bernerd Pho, PA-C and I agree with the above.    81 y.o. male with past medical history of CAD (s/p CABG in 2006 with LIMA-LAD, SVG-Ramus, SVG-OM, seqSVG-PLV-PDA), PAF (on Eliquis, s/p DCCV in 10/2016), HTN, and HLD who presented with increased fatigue, weakness and was found to be in atrial flutter with RVR. No palpitations, chest  pain or SOB, no LE edema.   He has been followed closely in the office over the past several months as he was experiencing hypotension and dizziness while on BB therapy. Coreg was discontinued. He had been positive for orthostatic hypotension.   Initial labs show Na+ of 132, creatinine 1.95 (previously 1.19 in 01/2017), K+ 4.0, WBC 11.5, Hgb 11.3, platelets 176. CXR shows no active cardiopulmonary disease but a 7 mm nodule is noted in the left mid-lung, thought to be most consistent with a granuloma. EKG shows atrial flutter with RVR 121 BPM< telemetry now shows atrial flutter with variable block, ventricular rates in 70', however low BP, on iv Cardizem.   The patient is not feeling well. Physical exam reveals iRRR, lungs CTA, no LE edema, good peripheral pulses.  Plan: - I will discontinue cardizem drip, start amiodarone drip - Plan for a TEE/DCCV if no spontaneous cardioversion overnight (he missed 2 doses of Eliquis) - continue Eliquis   Ena Dawley, MD 03/12/2017

## 2017-03-12 NOTE — ED Provider Notes (Signed)
[place] DEPT Provider Note   CSN: [id number] Arrival date & time: 03/12/17  1205     History   Chief Complaint Chief Complaint  Patient presents with  . Tachycardia    HPI Carl [name] is a 81 y.o. male.  HPI   81 year old male With history of premature atrial contraction, CAD status post CABG 5, prostate cancer status post radiation, sent here from [place] office for fast heart rate. Patient states for the past 3-4 days he has been having generalized weakness without any significant pain. The report that he fell off his chest 2 nights ago but denies any injury. Reports occasional heart palpitations when he is active. He was seen by his doctor and was placed on a Holter monitoring. He returned today for a follow-up and was sent here due to a heart rate of 160. Patient received IV fluid prior to arrival and states he feels better. Patient is a poor historian. He does have history of atrial fibrillation, currently on Eliquis.  He denies any fever, headache, lightheadedness, dizziness, chest pain, shortness of breath, abdominal pain, focal numbness/weakness. Did report that he was taken off some blood pressure medication a month ago but no recent medication changes.  No other complaints.   Past Medical History:  Diagnosis Date  . Anemia   . Arthritis    arms  . Bladder calculus   . Chronic anxiety   . Coronary artery disease CARDIOLOGIST-  DR Loralie Champagne   04/04/2005 -- CABG x5  . GERD (gastroesophageal reflux disease)   . History of non-ST elevation myocardial infarction (NSTEMI) 03/30/2005   immediate post-op chest and sob 03-30-2005 Left inguinal hernia repair  . History of prostate cancer urologist-  dr Risa Grill   hx radioactive prostate seed implants 1997--  currently on Lupron injection  . Hyperlipidemia   . Hyperplasia of prostate without lower urinary tract symptoms (LUTS)   . Hypertension   . Ischemic cardiomyopathy    echo (01/13) 45-50% septal akinesis,  mild MR/  echo  (01/16) ef 60-65%, mid-apical anteroseptal hypokinesis  . PAC (premature atrial contraction)   . S/P CABG x 5 04/04/2005   LIMA to LAD,  SVG to Ramus,  SVG to OM,  seqSVG to PLV and PDA  . Wears glasses     Patient Active Problem List   Diagnosis Date Noted  . Fall 03/12/2017  . Abdominal soreness 03/12/2017  . Orthostatic hypotension 02/16/2017  . Blood in stool 09/19/2016  . Anticoagulated 09/19/2016  . History of prostate cancer 09/04/2016  . Persistent atrial fibrillation ([place]) 09/04/2016  . Acute congestive heart failure ([place]) 09/04/2016  . Cardiomyopathy, ischemic 07/16/2012  . CKD (chronic kidney disease) stage 2, GFR 60-89 ml/min 07/16/2012  . Essential hypertension 08/05/2011  . Murmur 08/05/2011  . 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 Dyslipidemia 02/04/2011    Past Surgical History:  Procedure Laterality Date  . CARDIAC CATHETERIZATION  04/02/2005   dr Doreatha Lew    Critical three-vessel coronary disease --  Essentially normal left ventricular function , ef 55%  . CARDIOVASCULAR STRESS TEST  05-01-2008  dr Doreatha Lew /  dr harding   Low risk nuclear study w/ no ischemia or scar but flattened septal motion/  ef 64%  . CARDIOVERSION N/A 11/19/2016   Procedure: CARDIOVERSION;  Surgeon: Jerline Pain, MD;  Location: [place];  Service: Cardiovascular;  Laterality: N/A;  . COLONOSCOPY N/A 10/23/2016   Procedure: COLONOSCOPY;  Surgeon: Milus Banister, MD;  Location: [facility];  Service: Endoscopy;  Laterality: N/A;  . CORONARY ARTERY BYPASS GRAFT  04/04/2005   dr  Ricard Dillon   LIMA - LAD,  SVG - Ramus, SVG - OM,  seqSVG - PLV and PDA  . CYSTOSCOPY WITH LITHOLAPAXY N/A 05/07/2016   Procedure: CYSTOSCOPY WITH LITHOLAPAXY;  Surgeon: Rana Snare, MD;  Location: [facility];  Service: Urology;  Laterality: N/A;  . HOLMIUM LASER APPLICATION N/A 38/18/2993   Procedure: HOLMIUM LASER APPLICATION;  Surgeon: Rana Snare, MD;  Location: [facility];  Service: Urology;  Laterality: N/A;  . INGUINAL HERNIA REPAIR Left 03-30-2005  dr Excell Seltzer   incarcerated   . RADIOACTIVE PROSTATE SEED IMPLANTS  1997  . TRANSTHORACIC ECHOCARDIOGRAM  08/24/2014   mid anteroseptal and distal septak hypokinetic,  mild focal basal LVH, ef 60-65%,  grade 1 diastolic dysfunction/  mild MR/  mild LAE/  mild dilated RV with normal RVSP/  trivial TR       Home Medications    Prior to Admission medications   Medication Sig Start Date End Date Taking? Authorizing Provider  acetaminophen (TYLENOL) 500 MG tablet Take 500 mg by mouth every 6 (six) hours as needed (pain).     [provider]  apixaban (ELIQUIS) 5 MG TABS tablet Take 1 tablet (5 mg total) by mouth 2 (two) times daily. 09/19/16   Erlene Quan, PA-C  bicalutamide (CASODEX) 50 MG tablet Take 50 mg by mouth daily with breakfast.  10/22/15   [provider]  furosemide (LASIX) 20 MG tablet Take 20 mg by mouth daily as needed (swelling).     [provider]  HYDROcodone-acetaminophen (NORCO/VICODIN) 5-325 MG tablet Take 1 tablet by mouth every 6 (six) hours as needed. for pain 12/10/16   [provider]  omeprazole (PRILOSEC) 20 MG capsule TAKE ONE CAPSULE BY MOUTH EVERY DAY 11/25/16   Larey Dresser, MD  rosuvastatin (CRESTOR) 40 MG tablet TAKE 1 TABLET BY MOUTH EVERY DAY 12/17/16   Sherran Needs, NP    Family History Family History  Problem Relation Age of Onset  . Heart disease Mother   . Cancer Mother   . Cancer Father   . Cancer Brother     Social History Social History  Substance Use Topics  . Smoking status: Former Smoker    Years: 2.00    Types: Cigarettes    Quit date: 07/28/1960  . Smokeless tobacco: Never Used  . Alcohol use No     Allergies   Patient has no known allergies.   Review of Systems Review of Systems  All other systems reviewed and are negative.    Physical Exam Updated Vital Signs Ht 5\' 8"  (1.727 m)   Wt 72.6 kg  (160 lb)   SpO2 97%   BMI 24.33 kg/m   Physical Exam  Constitutional: He appears well-developed and well-nourished. No distress.  Chronically ill-appearing elderly male in no acute discomfort  HENT:  Head: Atraumatic.  Eyes: Conjunctivae are normal.  Neck: Neck supple. No JVD present.  Cardiovascular: Intact distal pulses.   Tachycardic and irregular heart rate, no murmur rubs or gallops   Pulmonary/Chest: Effort normal and breath sounds normal.  Abdominal: Soft. Bowel sounds are normal. He exhibits no distension. There is no tenderness.  Musculoskeletal:  Able to move all 4 extremities with poor effort  Neurological: He is alert.  Skin: No rash noted.  Psychiatric: He has a normal mood and affect.  Nursing note  and vitals reviewed.    ED Treatments / Results  Labs (all labs ordered are listed, but only abnormal results are displayed) Labs Reviewed  BASIC METABOLIC PANEL - Abnormal; Notable for the following:       Result Value   Sodium 132 (*)    CO2 17 (*)    Glucose, Bld 107 (*)    BUN 40 (*)    Creatinine, Ser 1.95 (*)    Calcium 8.3 (*)    GFR calc non Af Amer 31 (*)    GFR calc Af Amer 36 (*)    All other components within normal limits  CBC - Abnormal; Notable for the following:    WBC 11.5 (*)    RBC 4.16 (*)    Hemoglobin 11.3 (*)    HCT 34.6 (*)    RDW 19.1 (*)    All other components within normal limits  I-STAT TROPONIN, ED    EKG  EKG Interpretation  Date/Time:  Thursday March 12 2017 12:20:03 EDT Ventricular Rate:  121 PR Interval:    QRS Duration: 98 QT Interval:  336 QTC Calculation: 447 R Axis:   11 Text Interpretation:  Atrial flutter Baseline wander in lead(s) II aVF When compared to prior, new A flutter.  No STEMI Confirmed by Antony Blackbird [phone]) on 03/12/2017 1:12:06 PM       Radiology Dg Chest 2 View  Result Date: 03/12/2017 CLINICAL DATA:  Generalize weakness. Atrial fibrillation with tachycardia. EXAM: CHEST  2 VIEW  COMPARISON:  04/08/2005 and previous FINDINGS: Previous median sternotomy and CABG. Heart size is normal. There is aortic atherosclerosis. The pulmonary vascularity is normal. No infiltrate, collapse or effusion. There is a 7 mm nodule in the left mid lung that looks dense and is most consistent with a granuloma, but I cannot identify this on any of the previous chest radiographs as distant as 04/02/2005. IMPRESSION: No active cardiopulmonary disease.  No evidence of heart failure. Aortic atherosclerosis. Newly seen 7 mm nodule in the left mid lung. This appears well circumscribed and is dense, most consistent with a granuloma. However, I cannot define this on any of the images from 2006. Even given that, the strong likelihood is that this is benign. This could be followed on subsequent radiographs. Electronically Signed   By: Nelson Chimes M.D.   On: 03/12/2017 14:01    Procedures Procedures (including critical care time)  Medications Ordered in ED Medications  sodium chloride 0.9 % bolus 1,000 mL (not administered)  diltiazem (CARDIZEM) 100 mg in dextrose 5 % 100 mL (1 mg/mL) infusion (5 mg/hr Intravenous New Bag/Given 03/12/17 1320)  diltiazem (CARDIZEM) injection 10 mg (10 mg Intravenous Given 03/12/17 1250)     Initial Impression / Assessment and Plan / ED Course  I have reviewed the triage vital signs and the nursing notes.  Pertinent labs & imaging results that were available during my care of the patient were reviewed by me and considered in my medical decision making (see chart for details).     BP (!) 101/56   Pulse 76   Temp 98.4 F (36.9 C) (Oral)   Resp 19   Ht 5\' 8"  (1.727 m)   Wt 72.6 kg (160 lb)   SpO2 98%   BMI 24.33 kg/m    Final Clinical Impressions(s) / ED Diagnoses   Final diagnoses:  Atrial fibrillation with rapid ventricular response (HCC)  AKI (acute kidney injury) (HCC)  Generalized weakness    New Prescriptions New Prescriptions  No medications on  file   12:52 PM Patient with no history of A. fib with denies weakness, and found to be tachycardic. Patient is in atrial flutter with RVR.  Blood pressure is soft however improved from prior. Will give patient diltiazem, anticipate admission. Care discussed with Dr. Sherry Ruffing.    1:36 PM HR and BP normalized after patient received diltiazem.  Labs remarkable for elevated creatinine of 1.95 much elevated from baseline. BUN is 40. Will give IV fluid and will consult for admission.  2:13 PM Appreciate consultation from [facility] Dr. Wynetta Emery who agrees to see and admit pt to step down for further management of his afib/flutter w/ RVR.    CRITICAL CARE Performed by: Domenic Moras Total critical care time: 35 minutes Critical care time was exclusive of separately billable procedures and treating other patients. Critical care was necessary to treat or prevent imminent or life-threatening deterioration. Critical care was time spent personally by me on the following activities: development of treatment plan with patient and/or surrogate as well as nursing, discussions with consultants, evaluation of patient's response to treatment, examination of patient, obtaining history from patient or surrogate, ordering and performing treatments and interventions, ordering and review of laboratory studies, ordering and review of radiographic studies, pulse oximetry and re-evaluation of patient's condition.    Domenic Moras, PA-C 03/12/17 1414    Tegeler, Gwenyth Allegra, MD 03/16/17 [phone]

## 2017-03-12 NOTE — ED Triage Notes (Addendum)
Arrived by EMS from [place] office.  Patient had general weakness and while at [place] [address]'sand en route A flutter with history of afib per EMS.  Sent to ED for evaluation. Denies chest pain and sob. Alert answering and following commands appropriate.

## 2017-03-12 NOTE — Assessment & Plan Note (Signed)
Patient reports no injury. Potentially could be related to his heart rate being elevated and lightheadedness. He had no syncopal event. He's going to be evaluated in the emergency room for his tachycardia. Monitor for further falls.

## 2017-03-12 NOTE — Assessment & Plan Note (Signed)
Patient does note some issues with his urinary stream having to strain and having difficulty starting and stopping. He notes he does empty his bladder. He urinates 10 times a day. Discussed that he will need to see his urologist in follow-up once his evaluation for his elevated heart rate is completed.

## 2017-03-12 NOTE — Assessment & Plan Note (Addendum)
Patient notes intermittent abdominal soreness and loose stools since having his colonoscopy. No blood in his stool. Benign abdominal exam. He is going to the emergency room to be evaluated for his tachycardia. He will need to see GI once tachycardia evaluation is complete for follow-up to evaluate his soreness and loose stools.

## 2017-03-13 ENCOUNTER — Ambulatory Visit: Payer: Medicare Other

## 2017-03-13 ENCOUNTER — Inpatient Hospital Stay (HOSPITAL_COMMUNITY): Payer: Medicare Other

## 2017-03-13 DIAGNOSIS — R5381 Other malaise: Secondary | ICD-10-CM

## 2017-03-13 DIAGNOSIS — N179 Acute kidney failure, unspecified: Secondary | ICD-10-CM

## 2017-03-13 DIAGNOSIS — E871 Hypo-osmolality and hyponatremia: Secondary | ICD-10-CM

## 2017-03-13 DIAGNOSIS — R911 Solitary pulmonary nodule: Secondary | ICD-10-CM | POA: Diagnosis present

## 2017-03-13 DIAGNOSIS — Z7901 Long term (current) use of anticoagulants: Secondary | ICD-10-CM

## 2017-03-13 DIAGNOSIS — D649 Anemia, unspecified: Secondary | ICD-10-CM | POA: Diagnosis present

## 2017-03-13 LAB — COMPREHENSIVE METABOLIC PANEL WITH GFR
ALT: 26 U/L (ref 17–63)
AST: 33 U/L (ref 15–41)
Albumin: 2.7 g/dL — ABNORMAL LOW (ref 3.5–5.0)
Alkaline Phosphatase: 59 U/L (ref 38–126)
Anion gap: 8 (ref 5–15)
BUN: 38 mg/dL — ABNORMAL HIGH (ref 6–20)
CO2: 19 mmol/L — ABNORMAL LOW (ref 22–32)
Calcium: 8 mg/dL — ABNORMAL LOW (ref 8.9–10.3)
Chloride: 104 mmol/L (ref 101–111)
Creatinine, Ser: 1.54 mg/dL — ABNORMAL HIGH (ref 0.61–1.24)
GFR calc Af Amer: 48 mL/min — ABNORMAL LOW (ref >60)
GFR calc non Af Amer: 41 mL/min — ABNORMAL LOW (ref >60)
Glucose, Bld: 102 mg/dL — ABNORMAL HIGH (ref 65–99)
Potassium: 3.5 mmol/L (ref 3.5–5.1)
Sodium: 131 mmol/L — ABNORMAL LOW (ref 135–145)
Total Bilirubin: 0.7 mg/dL (ref 0.3–1.2)
Total Protein: 5.5 g/dL — ABNORMAL LOW (ref 6.5–8.1)

## 2017-03-13 LAB — CBC
HCT: 31.2 % — ABNORMAL LOW (ref 39.0–52.0)
Hemoglobin: 10.1 g/dL — ABNORMAL LOW (ref 13.0–17.0)
MCH: 26.8 pg (ref 26.0–34.0)
MCHC: 32.4 g/dL (ref 30.0–36.0)
MCV: 82.8 fL (ref 78.0–100.0)
Platelets: 150 10*3/uL (ref 150–400)
RBC: 3.77 MIL/uL — ABNORMAL LOW (ref 4.22–5.81)
RDW: 19.1 % — ABNORMAL HIGH (ref 11.5–15.5)
WBC: 6.4 10*3/uL (ref 4.0–10.5)

## 2017-03-13 LAB — LIPID PANEL
Cholesterol: 83 mg/dL (ref 0–200)
HDL: 27 mg/dL — ABNORMAL LOW (ref >40)
LDL Cholesterol: 40 mg/dL (ref 0–99)
Total CHOL/HDL Ratio: 3.1 RATIO
Triglycerides: 79 mg/dL (ref <150)
VLDL: 16 mg/dL (ref 0–40)

## 2017-03-13 LAB — MAGNESIUM: Magnesium: 1.8 mg/dL (ref 1.7–2.4)

## 2017-03-13 LAB — PHOSPHORUS: Phosphorus: 2.8 mg/dL (ref 2.5–4.6)

## 2017-03-13 LAB — TROPONIN I: Troponin I: 0.03 ng/mL (ref <0.03)

## 2017-03-13 NOTE — Progress Notes (Signed)
Progress Note    Carl [name]  TGG:[id number] DOB: 1935/08/25  DOA: 03/12/2017 PCP: [name], MD    Brief Narrative:   Chief complaint: F/U atrial fibrillation with RVR  Medical records reviewed and are as summarized below:  Carl [name] is an 81 y.o. male with a PMH of CAD status post 5 vessel CABG, prostate cancer, and atrial fibrillation/flutter, initially diagnosed 10/2016 status post DCCV, on Eliquis, followed by Dr. Marlou Porch, who was admitted 03/12/17 after he presented to his PCP office with a chief complaint of palpitations. He was directed to the ED where he was found to be in atrial fibrillation with RVR. Heart rate control was achieved with Cardizem drip.  Assessment/Plan:   Principal Problem:   Atrial fibrillation with RVR (HCC)/Anticoagulated Patient is on chronic blood thinners with Eliquis, and underwent DCCV 10/2016 with no known recurrence since that time. Heart rate control was achieved with Cardizem drip and cardiology consultation was requested. According to the cardiologist notes, he has not been able to tolerate low dose beta blocker therapy. He was not felt to be a candidate for repeat DCCV due to having missed 2 doses of his Eliquis. Cardiologist initiated treatment with antiarrhythmic therapy (amiodarone via continuous drip) and discontinued IV Cardizem. Possible TEE/DCCV on Monday. TSH and magnesium both WNL.  Active Problems:   Hx of CABG x 5 '06/ischemic cardiomyopathy On chronic anticoagulation with Eliquis so not on ASA. Continue statin. LDL well controlled at 40. Troponin 0.033.    Dyslipidemia Continue statin.    Generalized weakness Likely secondary to rapid A. fib. PT/OT when able.    Essential hypertension/orthostatic hypotension Blood pressure currently well controlled.    CKD (chronic kidney disease) stage 3 GFR currently consistent with stage III CKD. Creatinine improved 1.95---> 1.54 with holding Lasix and providing  gentle hydration.    History of prostate cancer Continue Casodex.    Leukocytosis Transient, resolved. No evidence of infection.    Hyponatremia Appeared dry on admission so Lasix was held and the patient was gently hydrated.    7 mm nodule in the left midlung Appears to be consistent with granuloma, but will need follow-up radiographs to ensure stability.    Deconditioning Evaluated by PT. Home health PT recommended. We'll get OT consult as well.    Normocytic anemia Likely anemia of chronic kidney disease.  Family Communication/Anticipated D/C date and plan/Code Status   DVT prophylaxis: Eliquis ordered. Code Status: Full Code.  Family Communication: No family at bedside. Disposition Plan: Home 03/16/17 if stable after cardioversion.   Medical Consultants:    Cardiology   Anti-Infectives:    None  Subjective:   Denies chest pain, palpitations, dyspnea.  Appetite good.  Objective:    Vitals:   03/12/17 2000 03/12/17 2338 03/13/17 0331 03/13/17 0520  BP: (!) 96/56 110/61 110/67   Pulse: 71 70 69   Resp: 16 20 19    Temp: 98.6 F (37 C) 98.7 F (37.1 C) 99.1 F (37.3 C)   TempSrc: Oral Oral Oral   SpO2: 100% 99% 98%   Weight:    76.2 kg (167 lb 15.9 oz)  Height:        Intake/Output Summary (Last 24 hours) at 03/13/17 0755 Last data filed at 03/13/17 0520  Gross per 24 hour  Intake          1023.56 ml  Output              650 ml  Net  373.56 ml   Filed Weights   03/12/17 1214 03/13/17 0520  Weight: 72.6 kg (160 lb) 76.2 kg (167 lb 15.9 oz)    Exam: General: No acute distress, sitting up in chair. Cardiovascular: Heart sounds are irregular and mildly tachy. No gallops or rubs. No murmurs. No JVD. Lungs: Clear to auscultation bilaterally with good air movement. No rales, rhonchi or wheezes. Abdomen: Soft, nontender, nondistended with normal active bowel sounds. No masses. No hepatosplenomegaly. Neurological: Alert and oriented 3.  Moves all extremities 4 with equal strength. Cranial nerves II through XII grossly intact. Skin: Warm and dry. No rashes or lesions. Extremities: No clubbing or cyanosis. 1-2+ edema. Pedal pulses 2+. Psychiatric: Mood and affect are flat. Insight and judgment are fair.   Data Reviewed:   I have personally reviewed following labs and imaging studies:  Labs: Labs show the following: Sodium 131, potassium 3.5, chloride 104, bicarbonate 19, BUN 38, creatinine 1.54, glucose 102, magnesium 1.8, phosphorus 2.8. LFTs are WNL. Protein and albumin are low at 5.5/2.7. CBC shows a WBC of 6.4, hemoglobin 10.1, hematocrit 31.2, platelets 150. Troponin 0.033. Hemoglobin A1c 5.5%. Cholesterol 83, HDL 27, LDL 40, triglycerides 79. TSH 1.445. MRSA screen negative.   Procedures and diagnostic studies:  Dg Chest 2 View  03/12/2017: My independent review of the image shows: No evidence of CHF. Nodule in the left midlung apparent.     Dg Chest Port 1 View 03/13/2017: My independent review of the image shows: No significant change from film done 03/12/17.  EKG 03/13/17: Personally reviewed and shows atrial flutter at 70 bpm with variable AV block.  Medications:   . apixaban  5 mg Oral BID  . bicalutamide  50 mg Oral QHS  . docusate sodium  100 mg Oral BID  . pantoprazole  40 mg Oral Daily  . rosuvastatin  40 mg Oral q1800  . sodium chloride flush  3 mL Intravenous Q12H  . sodium chloride flush  3 mL Intravenous Q12H   Continuous Infusions: . sodium chloride    . sodium chloride    . sodium chloride    . amiodarone 30 mg/hr (03/13/17 0300)     LOS: 1 day   RAMA,CHRISTINA  [facility] Pager [phone]. If unable to reach me by pager, please call my cell phone at [phone].  *Please refer to [email or web address], password [email or web address] to get updated schedule on who will round on this patient, as hospitalists switch teams weekly. If 7PM-7AM, please contact night-coverage at [email or web address], password [email or web address] for any  overnight needs.  03/13/2017, 7:55 AM

## 2017-03-13 NOTE — Progress Notes (Addendum)
Progress Note  Patient Name: Carl [name] Date of Encounter: 03/13/2017  Primary Cardiologist: Dr Marlou Porch  Subjective   He feels better today. Complains of back pain.  Inpatient Medications    Scheduled Meds: . apixaban  5 mg Oral BID  . bicalutamide  50 mg Oral QHS  . docusate sodium  100 mg Oral BID  . pantoprazole  40 mg Oral Daily  . rosuvastatin  40 mg Oral q1800  . sodium chloride flush  3 mL Intravenous Q12H  . sodium chloride flush  3 mL Intravenous Q12H   Continuous Infusions: . sodium chloride    . sodium chloride    . sodium chloride    . amiodarone 30 mg/hr (03/13/17 0300)   PRN Meds: sodium chloride, acetaminophen, ondansetron **OR** ondansetron (ZOFRAN) IV, polyethylene glycol, sodium chloride flush, sodium chloride flush   Vital Signs    Vitals:   03/12/17 2338 03/13/17 0331 03/13/17 0520 03/13/17 0758  BP: 110/61 110/67    Pulse: 70 69  86  Resp: 20 19  18   Temp: 98.7 F (37.1 C) 99.1 F (37.3 C)  98.2 F (36.8 C)  TempSrc: Oral Oral  Oral  SpO2: 99% 98%  98%  Weight:   167 lb 15.9 oz (76.2 kg)   Height:        Intake/Output Summary (Last 24 hours) at 03/13/17 0849 Last data filed at 03/13/17 0520  Gross per 24 hour  Intake          1023.56 ml  Output              650 ml  Net           373.56 ml   Filed Weights   03/12/17 1214 03/13/17 0520  Weight: 160 lb (72.6 kg) 167 lb 15.9 oz (76.2 kg)    Telemetry    A-flutter with variable block and ventricular rates in 24' - Personally Reviewed  Physical Exam   GEN: No acute distress.   Neck: No JVD Cardiac: iRRR, no murmurs, rubs, or gallops.  Respiratory: Clear to auscultation bilaterally. GI: Soft, nontender, non-distended  MS: No edema; No deformity. Neuro:  Nonfocal  Psych: Normal affect   Labs    Chemistry Recent Labs Lab 03/12/17 1220 03/13/17 0451  NA 132* 131*  K 4.0 3.5  CL 103 104  CO2 17* 19*  GLUCOSE 107* 102*  BUN 40* 38*  CREATININE 1.95* 1.54*    CALCIUM 8.3* 8.0*  PROT  --  5.5*  ALBUMIN  --  2.7*  AST  --  33  ALT  --  26  ALKPHOS  --  59  BILITOT  --  0.7  GFRNONAA 31* 41*  GFRAA 36* 48*  ANIONGAP 12 8     Hematology Recent Labs Lab 03/12/17 1220 03/13/17 0451  WBC 11.5* 6.4  RBC 4.16* 3.77*  HGB 11.3* 10.1*  HCT 34.6* 31.2*  MCV 83.2 82.8  MCH 27.2 26.8  MCHC 32.7 32.4  RDW 19.1* 19.1*  PLT 176 150    Cardiac Enzymes Recent Labs Lab 03/12/17 1637 03/12/17 2158 03/13/17 0451  TROPONINI 0.03* 0.03* 0.03*    Recent Labs Lab 03/12/17 1559  TROPIPOC 0.03     BNPNo results for input(s): BNP, PROBNP in the last 168 hours.   DDimer No results for input(s): DDIMER in the last 168 hours.   Radiology    Dg Chest 2 View  Result Date: 03/12/2017 CLINICAL DATA:  Generalize weakness. Atrial fibrillation  with tachycardia. EXAM: CHEST  2 VIEW COMPARISON:  04/08/2005 and previous FINDINGS: Previous median sternotomy and CABG. Heart size is normal. There is aortic atherosclerosis. The pulmonary vascularity is normal. No infiltrate, collapse or effusion. There is a 7 mm nodule in the left mid lung that looks dense and is most consistent with a granuloma, but I cannot identify this on any of the previous chest radiographs as distant as 04/02/2005. IMPRESSION: No active cardiopulmonary disease.  No evidence of heart failure. Aortic atherosclerosis. Newly seen 7 mm nodule in the left mid lung. This appears well circumscribed and is dense, most consistent with a granuloma. However, I cannot define this on any of the images from 2006. Even given that, the strong likelihood is that this is benign. This could be followed on subsequent radiographs. Electronically Signed   By:  Chimes M.D.   On: 03/12/2017 14:01   Dg Chest Port 1 View  Result Date: 03/13/2017 CLINICAL DATA:  Atrial fibrillation. EXAM: PORTABLE CHEST 1 VIEW COMPARISON:  03/12/2017. FINDINGS: Prior CABG. Heart size normal. No focal infiltrate. No pleural  effusion or pneumothorax. Stable nodular density in the left mid lung again most likely a calcified granulomas noted on prior study. As noted on prior study this can be followed on subsequent exams to demonstrate stability. No pleural effusion or pneumothorax. IMPRESSION: 1. Prior CABG.  Heart size stable. 2. Stable nodular opacity left mid lung most likely a calcified granuloma. As noted on prior study this could be followed on subsequent exams to demonstrate stability. Electronically Signed   By: Marcello Moores  Register   On: 03/13/2017 06:58     Patient Profile     81 y.o. male   Assessment & Plan    1. Paroxysmal Atrial Flutter with RVR - initially diagnosed with atrial flutter in 10/2016 with a DCCV performed at that time. No known recurrence since, although his office EKG on 03/05/2017 is concerning for atrial flutter. He presented with worsening weakness and fatigue but denies any palpitations, chest pain, or dyspnea.  - HR was initially elevated in the 150's, but now improved into the 70's while on IV Cardizem.  - initial labs show Na+ of 132, creatinine 1.95 (previously 1.19 in 01/2017), K+ 4.0, WBC 11.5, Hgb 11.3, platelets 176. TSH and Mg are pending.  - he has been unable to tolerate even low-dose BB therapy with Coreg 3.125mg  BID. Currently on IV Cardizem but unable to tolerate this with activity secondary to dizziness and orthostatic hypotension. He remains on Eliquis 5mg  BID for anticoagulation but missed his past two morning doses and is therefore not a candidate for DCCV at this time.  - CHA2DS2/VAS Stroke Risk Points  5  - started on amiodarone drip yesterday, now rate controlled but still in a-flutter, no space for a TEE DCCV today, scheduled for Monday - he needs TEE as he missed 2 doses of Eliquis  2. CAD - s/p CABG in 2006 with LIMA-LAD, SVG-Ramus, SVG-OM, and seqSVG-PLV-PDA - he denies any recurrent chest pain or dyspnea on exertion.  - continue statin. No ASA secondary to need  for Eliquis  3. Orthostatic Hypotension - has been a recurrent problem for the patient. Repeat orthostatics are pending.  - receiving IVF. BB therapy was previously discontinued as an outpatient.   4. Acute on Chronic Stage 3 CKD - baseline creatinine 1.1 - 1.2, elevated to 1.95 this admission.  - receiving IVF. Consider bladder scan to check for urinary retention and resumption of lower dose  Flomax or Finasteride.   5. Lung Nodule - CXR shows a 7 mm nodule in the left mid-lung, thought to be most consistent with a granuloma.  Signed, Ena Dawley, MD  03/13/2017, 8:49 AM

## 2017-03-13 NOTE — Evaluation (Signed)
Physical Therapy Evaluation Patient Details Name: Carl [name] MRN: [id number] DOB: Mar 18, 1936 Today's Date: 03/13/2017   History of Present Illness  81 yo admitted with weakness, fatigue, Afib. PMHx: CAD, CABG, CKD, PAF, orthostatic hypotension, prostate CA  Clinical Impression  Upon arrival pt seated at EOB after using BSC. PT assisted pt in getting cleaned up while standing. Pt able to mobilize without physical assist; however mobility limited due to cardiovascular status (HR increased from 90s to 138 during short bout of ambulation) and decreased activity tolerance. Pt reports PTA he was independent with ADLs at home and able to ambulate to and from his mailbox "several tenths of a mile away." Pt currently presents with deficits listed in PT problem list below and will benefit from continued acute therapy for increased mobilization, increased activity tolerance and strengthening to progress toward PLOF. Feel pt will be safe to return home with 24 hour assistance from family with HHPT. Pt agreeable with plan.      Follow Up Recommendations Home health PT    Equipment Recommendations  Cane    Recommendations for Other Services OT consult     Precautions / Restrictions Precautions Precautions: Fall      Mobility  Bed Mobility               General bed mobility comments: Pt seated at EOB upon arrival   Transfers Overall transfer level: Needs assistance   Transfers: Sit to/from Stand Sit to Stand: Min guard         General transfer comment: decreased control of descent with pt plopping on surface despite cues   Ambulation/Gait Ambulation/Gait assistance: Min guard Ambulation Distance (Feet): 80 Feet Assistive device: None Gait Pattern/deviations: Wide base of support;Trunk flexed;Decreased stride length   Gait velocity interpretation: Below normal speed for age/gender General Gait Details: pt with wide BOS for stable gait and limited by Afib with HR varied  between 108-138, non-sustained elevated rhythm  Stairs            Wheelchair Mobility    Modified Rankin (Stroke Patients Only)       Balance Overall balance assessment: Needs assistance Sitting-balance support: No upper extremity supported;Feet unsupported Sitting balance-Leahy Scale: Good     Standing balance support: During functional activity;No upper extremity supported Standing balance-Leahy Scale: Good Standing balance comment: Pt able to ambulate without AD or physical assist without LOB or apparent gait deficits                              Pertinent Vitals/Pain Pain Assessment: No/denies pain    Home Living Family/patient expects to be discharged to:: Private residence Living Arrangements: Children Available Help at Discharge: Family Type of Home: House Home Access: Stairs to enter Entrance Stairs-Rails: Right Entrance Stairs-Number of Steps: 6 Home Layout: One level Home Equipment: Shower seat      Prior Function Level of Independence: Needs assistance   Gait / Transfers Assistance Needed: independent  ADL's / Homemaking Assistance Needed: daughter does the homemaking, performed iADLs independently  Comments: 1 fall in last year at night     Hand Dominance        Extremity/Trunk Assessment   Upper Extremity Assessment Upper Extremity Assessment: Overall WFL for tasks assessed    Lower Extremity Assessment Lower Extremity Assessment: Overall WFL for tasks assessed    Cervical / Trunk Assessment Cervical / Trunk Assessment: Kyphotic  Communication   Communication: No difficulties  Cognition  Arousal/Alertness: Awake/alert Behavior During Therapy: Flat affect Overall Cognitive Status: Within Functional Limits for tasks assessed                                        General Comments      Exercises Total Joint Exercises Long Arc Quad: AROM;10 reps;Both;Seated Marching in Standing: Seated;Both;10  reps;AROM General Exercises - Lower Extremity Long Arc Quad: AROM;Both;Seated;10 reps Hip Flexion/Marching: AROM;Both;Seated;10 reps   Assessment/Plan    PT Assessment Patient needs continued PT services  PT Problem List Decreased mobility;Decreased activity tolerance;Decreased balance;Decreased knowledge of use of DME;Cardiopulmonary status limiting activity       PT Treatment Interventions Gait training;Therapeutic exercise;Patient/family education;Balance training;Functional mobility training;DME instruction;Therapeutic activities    PT Goals (Current goals can be found in the Care Plan section)  Acute Rehab PT Goals Patient Stated Goal: carpentry, gardening PT Goal Formulation: With patient Time For Goal Achievement: 03/27/17 Potential to Achieve Goals: Fair    Frequency Min 3X/week   Barriers to discharge Decreased caregiver support      Co-evaluation               AM-PAC PT "6 Clicks" Daily Activity  Outcome Measure                  End of Session Equipment Utilized During Treatment: Gait belt Activity Tolerance: Patient limited by fatigue Patient left: in chair;with call bell/phone within reach;Other (comment) (no chair alarm pads on unit with RN aware) Nurse Communication: Mobility status PT Visit Diagnosis: Difficulty in walking, not elsewhere classified (R26.2)    Time: 2229-7989 PT Time Calculation (min) (ACUTE ONLY): 18 min   Charges:   PT Evaluation $PT Eval Moderate Complexity: 1 Mod     PT G CodesElberta [name], SPT Acute Rehab [place] 03/13/2017, 11:39 AM

## 2017-03-14 DIAGNOSIS — N189 Chronic kidney disease, unspecified: Secondary | ICD-10-CM

## 2017-03-14 DIAGNOSIS — I25119 Atherosclerotic heart disease of native coronary artery with unspecified angina pectoris: Secondary | ICD-10-CM

## 2017-03-14 DIAGNOSIS — N289 Disorder of kidney and ureter, unspecified: Secondary | ICD-10-CM

## 2017-03-14 LAB — COMPREHENSIVE METABOLIC PANEL
ALT: 30 U/L (ref 17–63)
AST: 33 U/L (ref 15–41)
Albumin: 2.6 g/dL — ABNORMAL LOW (ref 3.5–5.0)
Alkaline Phosphatase: 58 U/L (ref 38–126)
Anion gap: 8 (ref 5–15)
BUN: 25 mg/dL — ABNORMAL HIGH (ref 6–20)
CO2: 20 mmol/L — ABNORMAL LOW (ref 22–32)
Calcium: 8 mg/dL — ABNORMAL LOW (ref 8.9–10.3)
Chloride: 107 mmol/L (ref 101–111)
Creatinine, Ser: 1.38 mg/dL — ABNORMAL HIGH (ref 0.61–1.24)
GFR calc Af Amer: 55 mL/min — ABNORMAL LOW (ref >60)
GFR calc non Af Amer: 47 mL/min — ABNORMAL LOW (ref >60)
Glucose, Bld: 103 mg/dL — ABNORMAL HIGH (ref 65–99)
Potassium: 3.6 mmol/L (ref 3.5–5.1)
Sodium: 135 mmol/L (ref 135–145)
Total Bilirubin: 0.9 mg/dL (ref 0.3–1.2)
Total Protein: 5.2 g/dL — ABNORMAL LOW (ref 6.5–8.1)

## 2017-03-14 LAB — CBC
HCT: 31.3 % — ABNORMAL LOW (ref 39.0–52.0)
Hemoglobin: 10 g/dL — ABNORMAL LOW (ref 13.0–17.0)
MCH: 26.9 pg (ref 26.0–34.0)
MCHC: 31.9 g/dL (ref 30.0–36.0)
MCV: 84.1 fL (ref 78.0–100.0)
Platelets: 129 10*3/uL — ABNORMAL LOW (ref 150–400)
RBC: 3.72 MIL/uL — ABNORMAL LOW (ref 4.22–5.81)
RDW: 19.5 % — ABNORMAL HIGH (ref 11.5–15.5)
WBC: 5.3 10*3/uL (ref 4.0–10.5)

## 2017-03-14 LAB — MAGNESIUM: Magnesium: 1.8 mg/dL (ref 1.7–2.4)

## 2017-03-14 LAB — PHOSPHORUS: Phosphorus: 2.6 mg/dL (ref 2.5–4.6)

## 2017-03-14 MED ORDER — DEXTROSE 5 % IV SOLN
1.0000 g | INTRAVENOUS | Status: DC
  Administered 2017-03-14: 1 g via INTRAVENOUS
  Filled 2017-03-14: qty 10

## 2017-03-14 MED ORDER — ACETAMINOPHEN 500 MG PO TABS
500.0000 mg | ORAL_TABLET | Freq: Four times a day (QID) | ORAL | Status: DC | PRN
  Administered 2017-03-14 – 2017-03-17 (×5): 500 mg via ORAL
  Filled 2017-03-14 (×5): qty 1

## 2017-03-14 MED ORDER — PIPERACILLIN-TAZOBACTAM 3.375 G IVPB
3.3750 g | Freq: Three times a day (TID) | INTRAVENOUS | Status: DC
Start: 2017-03-15 — End: 2017-03-15
  Administered 2017-03-15 (×2): 3.375 g via INTRAVENOUS
  Filled 2017-03-14 (×3): qty 50

## 2017-03-14 MED ORDER — PIPERACILLIN-TAZOBACTAM 3.375 G IVPB 30 MIN
3.3750 g | Freq: Once | INTRAVENOUS | Status: AC
  Administered 2017-03-14: 3.375 g via INTRAVENOUS
  Filled 2017-03-14: qty 50

## 2017-03-14 NOTE — Progress Notes (Signed)
Progress Note  Patient Name: Carl [name] Date of Encounter: 03/14/2017  Primary Cardiologist: Dr. Candee Furbish  Subjective   No complaints chest pain or palpitations. No shortness of breath at rest.  Inpatient Medications    Scheduled Meds: . apixaban  5 mg Oral BID  . bicalutamide  50 mg Oral QHS  . docusate sodium  100 mg Oral BID  . pantoprazole  40 mg Oral Daily  . rosuvastatin  40 mg Oral q1800  . sodium chloride flush  3 mL Intravenous Q12H  . sodium chloride flush  3 mL Intravenous Q12H   Continuous Infusions: . sodium chloride    . sodium chloride    . sodium chloride    . amiodarone 30 mg/hr (03/14/17 0900)   PRN Meds: sodium chloride, acetaminophen, ondansetron **OR** ondansetron (ZOFRAN) IV, polyethylene glycol, sodium chloride flush, sodium chloride flush   Vital Signs    Vitals:   03/13/17 2314 03/14/17 0344 03/14/17 0730 03/14/17 0731  BP: (!) 95/56 (!) 92/54 (!) 89/58   Pulse: 66 64 65   Resp: (!) 21 18 19 19   Temp: 97.7 F (36.5 C) 97.8 F (36.6 C)  98 F (36.7 C)  TempSrc: Oral Oral  Oral  SpO2: 99% 97% 97% 99%  Weight:  165 lb 12.6 oz (75.2 kg)    Height:        Intake/Output Summary (Last 24 hours) at 03/14/17 1103 Last data filed at 03/14/17 1000  Gross per 24 hour  Intake           1447.3 ml  Output             1475 ml  Net            -27.7 ml   Filed Weights   03/12/17 1214 03/13/17 0520 03/14/17 0344  Weight: 160 lb (72.6 kg) 167 lb 15.9 oz (76.2 kg) 165 lb 12.6 oz (75.2 kg)    Telemetry    Atrial flutter. Personally reviewed.  ECG    Tracing from 8/70/2018 shows atrial flutter with variable conduction. Personally reviewed.  Physical Exam   GEN: Elderly male. No acute distress.   Neck: No JVD. Cardiac:  Irregular, no gallop. Respiratory: Nonlabored. Clear to auscultation bilaterally. GI: Soft, nontender, bowel sounds present. MS: No edema; No deformity.  Labs    Chemistry Recent Labs Lab 03/12/17 1220  03/13/17 0451 03/14/17 0345  NA 132* 131* 135  K 4.0 3.5 3.6  CL 103 104 107  CO2 17* 19* 20*  GLUCOSE 107* 102* 103*  BUN 40* 38* 25*  CREATININE 1.95* 1.54* 1.38*  CALCIUM 8.3* 8.0* 8.0*  PROT  --  5.5* 5.2*  ALBUMIN  --  2.7* 2.6*  AST  --  33 33  ALT  --  26 30  ALKPHOS  --  59 58  BILITOT  --  0.7 0.9  GFRNONAA 31* 41* 47*  GFRAA 36* 48* 55*  ANIONGAP 12 8 8      Hematology Recent Labs Lab 03/12/17 1220 03/13/17 0451 03/14/17 0345  WBC 11.5* 6.4 5.3  RBC 4.16* 3.77* 3.72*  HGB 11.3* 10.1* 10.0*  HCT 34.6* 31.2* 31.3*  MCV 83.2 82.8 84.1  MCH 27.2 26.8 26.9  MCHC 32.7 32.4 31.9  RDW 19.1* 19.1* 19.5*  PLT 176 150 129*    Cardiac Enzymes Recent Labs Lab 03/12/17 1637 03/12/17 2158 03/13/17 0451  TROPONINI 0.03* 0.03* 0.03*    Recent Labs Lab 03/12/17 1559  TROPIPOC 0.03  Radiology    Dg Chest 2 View  Result Date: 03/12/2017 CLINICAL DATA:  Generalize weakness. Atrial fibrillation with tachycardia. EXAM: CHEST  2 VIEW COMPARISON:  04/08/2005 and previous FINDINGS: Previous median sternotomy and CABG. Heart size is normal. There is aortic atherosclerosis. The pulmonary vascularity is normal. No infiltrate, collapse or effusion. There is a 7 mm nodule in the left mid lung that looks dense and is most consistent with a granuloma, but I cannot identify this on any of the previous chest radiographs as distant as 04/02/2005. IMPRESSION: No active cardiopulmonary disease.  No evidence of heart failure. Aortic atherosclerosis. Newly seen 7 mm nodule in the left mid lung. This appears well circumscribed and is dense, most consistent with a granuloma. However, I cannot define this on any of the images from 2006. Even given that, the strong likelihood is that this is benign. This could be followed on subsequent radiographs. Electronically Signed   By: Nelson Chimes M.D.   On: 03/12/2017 14:01   Dg Chest Port 1 View  Result Date: 03/13/2017 CLINICAL DATA:  Atrial  fibrillation. EXAM: PORTABLE CHEST 1 VIEW COMPARISON:  03/12/2017. FINDINGS: Prior CABG. Heart size normal. No focal infiltrate. No pleural effusion or pneumothorax. Stable nodular density in the left mid lung again most likely a calcified granulomas noted on prior study. As noted on prior study this can be followed on subsequent exams to demonstrate stability. No pleural effusion or pneumothorax. IMPRESSION: 1. Prior CABG.  Heart size stable. 2. Stable nodular opacity left mid lung most likely a calcified granuloma. As noted on prior study this could be followed on subsequent exams to demonstrate stability. Electronically Signed   By: Marcello Moores  Register   On: 03/13/2017 06:58    Cardiac Studies   Echocardiogram 09/16/2016: Study Conclusions  - Left ventricle: The cavity size was normal. Wall thickness was   increased in a pattern of moderate LVH. Systolic function was   normal. The estimated ejection fraction was in the range of 50%   to 55%. The study is not technically sufficient to allow   evaluation of LV diastolic function. - Ventricular septum: Septal motion showed paradox. - Mitral valve: There was moderate regurgitation. - Left atrium: The appendage was severely dilated. - Right ventricle: The cavity size was moderately dilated. - Right atrium: The atrium was moderately dilated. - Atrial septum: No defect or patent foramen ovale was identified. - Tricuspid valve: There was moderate regurgitation. - Pulmonary arteries: PA peak pressure: 62 mm Hg (S).  Patient Profile     81 y.o. male with a history of CAD status post CABG in 2006, PAF on Eliquis status post cardioversion in April, hypertension, and hyperlipidemia. He is now being managed for atrial flutter with symptoms including weakness and fatigue. Currently on IV amiodarone to assist with heart rate control and plan for TEE guided cardioversion on Monday. He has been on Eliquis but missed 2 doses recently.  Assessment & Plan      1. Typical atrial flutter with variable conduction. CHADSVASC score is 5. Continues on Eliquis and IV amiodarone.  2. CAD status post CABG in 2006. No active angina symptoms. Troponin I levels equivocal at 0.03 and in flat pattern.  3. History of orthostatic hypotension, has limited therapy with beta blockers as an outpatient.  4. Acute on chronic CKD stage 3. Creatinine down to 1.38.  5. History of atrial fibrillation status post cardioversion in April of this year.  6. Newly noted 7 mm mid left  lung nodule most suggestive of granuloma but not seen on prior images.  Continue Eliquis and IV amiodarone pending planned TEE guided cardioversion on Monday. Patient missed 2 doses of Eliquis recently which is why TEE is being pursued. May want to ultimately get EP opinion regarding whether standing antiarrhythmic therapy or even potentially ablation of atrial flutter might be consideration. He has had both atrial fibrillation and atrial flutter documented this year.  Signed, Rozann Lesches, MD  03/14/2017, 11:03 AM

## 2017-03-14 NOTE — Progress Notes (Signed)
[facility] Progress Note Patient Name: AUBERT CHOYCE DOB: 1935-10-13 MRN: [id number]   Date of Service  03/14/2017  HPI/Events of Note  Urine culture positive for pseudomonas.  sens pending.  eICU Interventions  Rocephin changed to zosyn until sensitivities available     Intervention Category Intermediate Interventions: Infection - evaluation and management  Mauri Brooklyn, P 03/14/2017, 4:33 PM

## 2017-03-14 NOTE — Progress Notes (Signed)
[facility] TEAM 1 - Stepdown/ICU TEAM  Carl [name]  FXT:[id number] DOB: 1935/09/09 DOA: 03/12/2017 PCP: [name], MD    Brief Narrative:  81 y.o. male with a Hx of CAD status post 5 vessel CABG, prostate cancer, and atrial fibrillation/flutter initially diagnosed 10/2016 status post DCCV, on Eliquis, followed by Dr. Marlou Porch, who was admitted 03/12/17 after he presented to his PCP office with a chief complaint of palpitations. He was directed to the ED where he was found to be in atrial fibrillation with RVR. Heart rate control was achieved with Cardizem drip.  Subjective: The patient is resting comfortably with no evidence or respiratory distress or uncontrolled pain.  His heart rate is presently well controlled.  Assessment & Plan:  Atrial fibrillation with RVR / Anticoagulated on chronic Eliquis - s/p DCCV 10/2016 with no known recurrence since - rate control achieved with Cardizem drip - has not been able to tolerate low dose beta blocker therapy previously - Cardiology initiated treatment with antiarrhythmic therapy - possible TEE/DCCV on Monday (missed 2 doses of Eliquis recently)  Gram negative rod UTI UA + & urine culture pending but gram neg rods already noted - empiric rocephin for now and follow  CABG x 5 2006 / ischemic cardiomyopathy not on ASA as he is on eliquis -  continue statin (LDL 40) - Troponin 0.033.  Dyslipidemia Continue statin  Generalized weakness PT/OT to assess  Essential hypertension/orthostatic hypotension BP controlled   CKD stage 3 crt improving w/ gently hydration and rate control - follow trend   Recent Labs Lab 03/12/17 1220 03/13/17 0451 03/14/17 0345  CREATININE 1.95* 1.54* 1.38*   History of prostate cancer Continue Casodex  Hyponatremia Appeared dry on admission - improving w/ NS hydration   7 mm nodule in the left midlung Appears to be consistent with granuloma - will need follow-up radiographs to ensure  stability  Normocytic anemia Likely anemia of chronic kidney disease - follow   DVT prophylaxis: eliquis Code Status: FULL CODE Family Communication: no family present at time of exam  Disposition Plan: SDU   Consultants:  Cardiology   Procedures: none  Antimicrobials:  none  Objective: Blood pressure (!) 89/58, pulse 65, temperature 98 F (36.7 C), temperature source Oral, resp. rate 19, height 5\' 8"  (1.727 m), weight 75.2 kg (165 lb 12.6 oz), SpO2 99 %.  Intake/Output Summary (Last 24 hours) at 03/14/17 1048 Last data filed at 03/14/17 1000  Gross per 24 hour  Intake           1447.3 ml  Output             1475 ml  Net            -27.7 ml   Filed Weights   03/12/17 1214 03/13/17 0520 03/14/17 0344  Weight: 72.6 kg (160 lb) 76.2 kg (167 lb 15.9 oz) 75.2 kg (165 lb 12.6 oz)    Examination: General: No acute respiratory distress Lungs: Clear to auscultation bilaterally without wheezes or crackles Cardiovascular: Regular rate without murmur gallop or rub normal S1 and S2 Abdomen: Nontender, nondistended, soft, bowel sounds positive, no rebound, no ascites, no appreciable mass Extremities: No significant cyanosis, clubbing, or edema bilateral lower extremities  CBC:  Recent Labs Lab 03/12/17 1220 03/13/17 0451 03/14/17 0345  WBC 11.5* 6.4 5.3  HGB 11.3* 10.1* 10.0*  HCT 34.6* 31.2* 31.3*  MCV 83.2 82.8 84.1  PLT 176 150 299*   Basic Metabolic Panel:  Recent Labs Lab  03/12/17 1220 03/13/17 0451 03/14/17 0345  NA 132* 131* 135  K 4.0 3.5 3.6  CL 103 104 107  CO2 17* 19* 20*  GLUCOSE 107* 102* 103*  BUN 40* 38* 25*  CREATININE 1.95* 1.54* 1.38*  CALCIUM 8.3* 8.0* 8.0*  MG 1.8 1.8 1.8  PHOS  --  2.8 2.6   GFR: Estimated Creatinine Clearance: 42 mL/min (A) (by C-G formula based on SCr of 1.38 mg/dL (H)).  Liver Function Tests:  Recent Labs Lab 03/13/17 0451 03/14/17 0345  AST 33 33  ALT 26 30  ALKPHOS 59 58  BILITOT 0.7 0.9  PROT 5.5*  5.2*  ALBUMIN 2.7* 2.6*    Cardiac Enzymes:  Recent Labs Lab 03/12/17 1637 03/12/17 2158 03/13/17 0451  TROPONINI 0.03* 0.03* 0.03*    HbA1C: Hgb A1c MFr Bld  Date/Time Value Ref Range Status  03/12/2017 04:37 PM 5.5 4.8 - 5.6 % Final    Comment:    (NOTE) Pre diabetes:          5.7%-6.4% Diabetes:              >6.4% Glycemic control for   <7.0% adults with diabetes      Recent Results (from the past 240 hour(s))  Urine Culture     Status: Abnormal (Preliminary result)   Collection Time: 03/12/17  3:06 PM  Result Value Ref Range Status   Specimen Description URINE, CLEAN CATCH  Final   Special Requests NONE  Final   Culture (A)  Final    >=100,000 COLONIES/mL GRAM NEGATIVE RODS IDENTIFICATION AND SUSCEPTIBILITIES TO FOLLOW    Report Status PENDING  Incomplete  Culture, blood (Routine X 2) w Reflex to ID Panel     Status: None (Preliminary result)   Collection Time: 03/12/17  3:46 PM  Result Value Ref Range Status   Specimen Description BLOOD RIGHT ANTECUBITAL  Final   Special Requests   Final    BOTTLES DRAWN AEROBIC AND ANAEROBIC Blood Culture adequate volume   Culture NO GROWTH < 24 HOURS  Final   Report Status PENDING  Incomplete  Culture, blood (Routine X 2) w Reflex to ID Panel     Status: None (Preliminary result)   Collection Time: 03/12/17  3:46 PM  Result Value Ref Range Status   Specimen Description BLOOD RIGHT HAND  Final   Special Requests   Final    BOTTLES DRAWN AEROBIC AND ANAEROBIC Blood Culture adequate volume   Culture NO GROWTH < 24 HOURS  Final   Report Status PENDING  Incomplete  MRSA PCR Screening     Status: None   Collection Time: 03/12/17  4:30 PM  Result Value Ref Range Status   MRSA by PCR NEGATIVE NEGATIVE Final    Comment:        The GeneXpert MRSA Assay (FDA approved for NASAL specimens only), is one component of a comprehensive MRSA colonization surveillance program. It is not intended to diagnose MRSA infection nor to  guide or monitor treatment for MRSA infections.      Scheduled Meds: . apixaban  5 mg Oral BID  . bicalutamide  50 mg Oral QHS  . docusate sodium  100 mg Oral BID  . pantoprazole  40 mg Oral Daily  . rosuvastatin  40 mg Oral q1800  . sodium chloride flush  3 mL Intravenous Q12H  . sodium chloride flush  3 mL Intravenous Q12H     LOS: 2 days   Cherene Altes, MD [facility] Office  [phone] Pager - Text Page per Shea Evans as per below:  On-Call/Text Page:      [email or web address]      password [email or web address]  If 7PM-7AM, please contact night-coverage [email or web address] Password [facility] 03/14/2017, 10:48 AM

## 2017-03-14 NOTE — Progress Notes (Signed)
Pharmacy Antibiotic Note Carl [name] is a 81 y.o. male admitted on 03/12/2017 with UTI.  Pharmacy has been consulted for Zosyn dosing.  Plan: Zosyn 3.375g IV q8h (4 hour infusion).  Height: 5\' 8"  (172.7 cm) Weight: 165 lb 12.6 oz (75.2 kg) IBW/kg (Calculated) : 68.4  Temp (24hrs), Avg:98.1 F (36.7 C), Min:97.7 F (36.5 C), Max:98.7 F (37.1 C)   Recent Labs Lab 03/12/17 1220 03/13/17 0451 03/14/17 0345  WBC 11.5* 6.4 5.3  CREATININE 1.95* 1.54* 1.38*    Estimated Creatinine Clearance: 42 mL/min (A) (by C-G formula based on SCr of 1.38 mg/dL (H)).    No Known Allergies  Thank you for allowing pharmacy to be a part of this patient's care.  Vincenza Hews, PharmD, BCPS 03/14/2017, 4:38 PM

## 2017-03-15 LAB — CBC
HCT: 32.2 % — ABNORMAL LOW (ref 39.0–52.0)
Hemoglobin: 10.5 g/dL — ABNORMAL LOW (ref 13.0–17.0)
MCH: 27.1 pg (ref 26.0–34.0)
MCHC: 32.6 g/dL (ref 30.0–36.0)
MCV: 83 fL (ref 78.0–100.0)
Platelets: 147 10*3/uL — ABNORMAL LOW (ref 150–400)
RBC: 3.88 MIL/uL — ABNORMAL LOW (ref 4.22–5.81)
RDW: 19.1 % — ABNORMAL HIGH (ref 11.5–15.5)
WBC: 5.6 10*3/uL (ref 4.0–10.5)

## 2017-03-15 LAB — COMPREHENSIVE METABOLIC PANEL
ALT: 34 U/L (ref 17–63)
AST: 36 U/L (ref 15–41)
Albumin: 2.5 g/dL — ABNORMAL LOW (ref 3.5–5.0)
Alkaline Phosphatase: 65 U/L (ref 38–126)
Anion gap: 6 (ref 5–15)
BUN: 18 mg/dL (ref 6–20)
CO2: 22 mmol/L (ref 22–32)
Calcium: 8.1 mg/dL — ABNORMAL LOW (ref 8.9–10.3)
Chloride: 108 mmol/L (ref 101–111)
Creatinine, Ser: 1.32 mg/dL — ABNORMAL HIGH (ref 0.61–1.24)
GFR calc Af Amer: 58 mL/min — ABNORMAL LOW (ref >60)
GFR calc non Af Amer: 50 mL/min — ABNORMAL LOW (ref >60)
Glucose, Bld: 106 mg/dL — ABNORMAL HIGH (ref 65–99)
Potassium: 3.7 mmol/L (ref 3.5–5.1)
Sodium: 136 mmol/L (ref 135–145)
Total Bilirubin: 0.7 mg/dL (ref 0.3–1.2)
Total Protein: 5.5 g/dL — ABNORMAL LOW (ref 6.5–8.1)

## 2017-03-15 LAB — URINE CULTURE: Culture: >=100000 — AB

## 2017-03-15 MED ORDER — CIPROFLOXACIN HCL 500 MG PO TABS
500.0000 mg | ORAL_TABLET | Freq: Two times a day (BID) | ORAL | Status: DC
  Administered 2017-03-15 – 2017-03-17 (×4): 500 mg via ORAL
  Filled 2017-03-15 (×4): qty 1

## 2017-03-15 NOTE — Progress Notes (Signed)
[facility] TEAM 1 - Stepdown/ICU TEAM  [name]  ZOX:[id number] DOB: May 03, 1936 DOA: 03/12/2017 PCP: [name], MD    Brief Narrative:  81 y.o. male with a Hx of CAD status post 5 vessel CABG, prostate cancer, and atrial fibrillation/flutter initially diagnosed 10/2016 status post DCCV, on Eliquis, followed by Dr. Marlou Porch, who was admitted 03/12/17 after he presented to his PCP office with a chief complaint of palpitations. He was directed to the ED where he was found to be in atrial fibrillation with RVR. Heart rate control was achieved with Cardizem drip.  Subjective: The patient is resting comfortably in bed and has no complaints at this time.  He denies chest pain or shortness of breath.  Assessment & Plan:  Atrial fibrillation with RVR / Anticoagulated CHA2DS2-VASc is 5 - on chronic Eliquis - s/p DCCV 10/2016 with no known recurrence since - rate control achieved with Cardizem drip - has not been able to tolerate low dose beta blocker therapy previously due to orthostasis - Cardiology initiated treatment with amio - possible TEE/DCCV on Monday (missed 2 doses of Eliquis recently) - care as per Cards service   Pseudomonas UTI Culture now confirming greater than 100,000 colonies of Pseudomonas - Cipro sensitivity confirmed - simplify regimen to oral Cipro and follow clinically  CABG x 5 2006 / ischemic cardiomyopathy not on ASA as he is on eliquis -  continue statin (LDL 40) - Troponin 0.033.  Dyslipidemia Continue statin  Generalized weakness PT/OT to assess  Essential hypertension/orthostatic hypotension BP controlled   CKD stage 3 crt improving w/ gently hydration and rate control - follow trend   Recent Labs Lab 03/12/17 1220 03/13/17 0451 03/14/17 0345 03/15/17 0342  CREATININE 1.95* 1.54* 1.38* 1.32*   History of prostate cancer Continue Casodex  Hyponatremia Appeared dry on admission - resolved w/ NS hydration   7 mm nodule in the left  midlung Appears to be consistent with granuloma - will need follow-up outpt radiographs to ensure stability  Normocytic anemia Likely anemia of chronic kidney disease - follow   DVT prophylaxis: eliquis Code Status: FULL CODE Family Communication: no family present at time of exam  Disposition Plan: SDU while on gtt  Consultants:  Cardiology   Procedures: none  Antimicrobials:  Ceftriaxone 8/18 Zosyn 8/18 > 8/19 Ciprofloxacin 8/19 >  Objective: Blood pressure 118/82, pulse 83, temperature 99.3 F (37.4 C), temperature source Oral, resp. rate 18, height 5\' 8"  (1.727 m), weight 71.4 kg (157 lb 6.5 oz), SpO2 100 %.  Intake/Output Summary (Last 24 hours) at 03/15/17 1326 Last data filed at 03/15/17 1126  Gross per 24 hour  Intake           652.15 ml  Output             1525 ml  Net          -872.85 ml   Filed Weights   03/13/17 0520 03/14/17 0344 03/15/17 0339  Weight: 76.2 kg (167 lb 15.9 oz) 75.2 kg (165 lb 12.6 oz) 71.4 kg (157 lb 6.5 oz)    Examination: General: No acute respiratory distress - alert  Lungs: CTA B w/o wheeze  Cardiovascular: Regular rate  Abdomen: NT/ND, soft, bs+  Extremities: No edema bilateral lower extremities  CBC:  Recent Labs Lab 03/12/17 1220 03/13/17 0451 03/14/17 0345 03/15/17 0342  WBC 11.5* 6.4 5.3 5.6  HGB 11.3* 10.1* 10.0* 10.5*  HCT 34.6* 31.2* 31.3* 32.2*  MCV 83.2 82.8 84.1 83.0  PLT 176 150 129* 532*   Basic Metabolic Panel:  Recent Labs Lab 03/12/17 1220 03/13/17 0451 03/14/17 0345 03/15/17 0342  NA 132* 131* 135 136  K 4.0 3.5 3.6 3.7  CL 103 104 107 108  CO2 17* 19* 20* 22  GLUCOSE 107* 102* 103* 106*  BUN 40* 38* 25* 18  CREATININE 1.95* 1.54* 1.38* 1.32*  CALCIUM 8.3* 8.0* 8.0* 8.1*  MG 1.8 1.8 1.8  --   PHOS  --  2.8 2.6  --    GFR: Estimated Creatinine Clearance: 43.9 mL/min (A) (by C-G formula based on SCr of 1.32 mg/dL (H)).  Liver Function Tests:  Recent Labs Lab 03/13/17 0451  03/14/17 0345 03/15/17 0342  AST 33 33 36  ALT 26 30 34  ALKPHOS 59 58 65  BILITOT 0.7 0.9 0.7  PROT 5.5* 5.2* 5.5*  ALBUMIN 2.7* 2.6* 2.5*    Cardiac Enzymes:  Recent Labs Lab 03/12/17 1637 03/12/17 2158 03/13/17 0451  TROPONINI 0.03* 0.03* 0.03*    HbA1C: Hgb A1c MFr Bld  Date/Time Value Ref Range Status  03/12/2017 04:37 PM 5.5 4.8 - 5.6 % Final    Comment:    (NOTE) Pre diabetes:          5.7%-6.4% Diabetes:              >6.4% Glycemic control for   <7.0% adults with diabetes      Recent Results (from the past 240 hour(s))  Urine Culture     Status: Abnormal   Collection Time: 03/12/17  3:06 PM  Result Value Ref Range Status   Specimen Description URINE, CLEAN CATCH  Final   Special Requests NONE  Final   Culture >=100,000 COLONIES/mL PSEUDOMONAS AERUGINOSA (A)  Final   Report Status 03/15/2017 FINAL  Final   Organism ID, Bacteria PSEUDOMONAS AERUGINOSA (A)  Final      Susceptibility   Pseudomonas aeruginosa - MIC*    CEFTAZIDIME 4 SENSITIVE Sensitive     CIPROFLOXACIN <=0.25 SENSITIVE Sensitive     GENTAMICIN 2 SENSITIVE Sensitive     IMIPENEM 2 SENSITIVE Sensitive     PIP/TAZO 8 SENSITIVE Sensitive     CEFEPIME 2 SENSITIVE Sensitive     * >=100,000 COLONIES/mL PSEUDOMONAS AERUGINOSA  Culture, blood (Routine X 2) w Reflex to ID Panel     Status: None (Preliminary result)   Collection Time: 03/12/17  3:46 PM  Result Value Ref Range Status   Specimen Description BLOOD RIGHT ANTECUBITAL  Final   Special Requests   Final    BOTTLES DRAWN AEROBIC AND ANAEROBIC Blood Culture adequate volume   Culture NO GROWTH 2 DAYS  Final   Report Status PENDING  Incomplete  Culture, blood (Routine X 2) w Reflex to ID Panel     Status: None (Preliminary result)   Collection Time: 03/12/17  3:46 PM  Result Value Ref Range Status   Specimen Description BLOOD RIGHT HAND  Final   Special Requests   Final    BOTTLES DRAWN AEROBIC AND ANAEROBIC Blood Culture adequate  volume   Culture NO GROWTH 2 DAYS  Final   Report Status PENDING  Incomplete  MRSA PCR Screening     Status: None   Collection Time: 03/12/17  4:30 PM  Result Value Ref Range Status   MRSA by PCR NEGATIVE NEGATIVE Final    Comment:        The GeneXpert MRSA Assay (FDA approved for NASAL specimens only), is one component of a  comprehensive MRSA colonization surveillance program. It is not intended to diagnose MRSA infection nor to guide or monitor treatment for MRSA infections.      Scheduled Meds: . apixaban  5 mg Oral BID  . bicalutamide  50 mg Oral QHS  . docusate sodium  100 mg Oral BID  . pantoprazole  40 mg Oral Daily  . rosuvastatin  40 mg Oral q1800     LOS: 3 days   Cherene Altes, MD [facility] Office  [phone] Pager - Text Page per Amion as per below:  On-Call/Text Page:      [email or web address]      password [email or web address]  If 7PM-7AM, please contact night-coverage [email or web address] Password [email or web address] 03/15/2017, 1:26 PM

## 2017-03-15 NOTE — Progress Notes (Signed)
Progress Note  Patient Name: Carl [name] Date of Encounter: 03/15/2017  Primary Cardiologist: Dr. Candee Furbish  Subjective   No palpitations or chest pain.  Inpatient Medications    Scheduled Meds: . apixaban  5 mg Oral BID  . bicalutamide  50 mg Oral QHS  . docusate sodium  100 mg Oral BID  . pantoprazole  40 mg Oral Daily  . rosuvastatin  40 mg Oral q1800   Continuous Infusions: . sodium chloride    . amiodarone 30 mg/hr (03/15/17 1207)  . piperacillin-tazobactam (ZOSYN)  IV 3.375 g (03/15/17 0906)   PRN Meds: acetaminophen, ondansetron **OR** ondansetron (ZOFRAN) IV, polyethylene glycol   Vital Signs    Vitals:   03/14/17 2330 03/15/17 0339 03/15/17 0726 03/15/17 1113  BP: 102/62 102/67 111/65 118/82  Pulse: 72 79 65 83  Resp: (!) 22 17 18 18   Temp: 98 F (36.7 C) 98.2 F (36.8 C) 97.7 F (36.5 C) 99.3 F (37.4 C)  TempSrc: Oral Oral Oral Oral  SpO2: 99% 99% 98% 100%  Weight:  157 lb 6.5 oz (71.4 kg)    Height:        Intake/Output Summary (Last 24 hours) at 03/15/17 1243 Last data filed at 03/15/17 1126  Gross per 24 hour  Intake           652.15 ml  Output             1525 ml  Net          -872.85 ml   Filed Weights   03/13/17 0520 03/14/17 0344 03/15/17 0339  Weight: 167 lb 15.9 oz (76.2 kg) 165 lb 12.6 oz (75.2 kg) 157 lb 6.5 oz (71.4 kg)    Telemetry    Probable sinus rhythm with frequent PACs. Personally reviewed.  ECG    Tracing from 8/70/2018 shows atrial flutter with variable conduction. Personally reviewed.  Physical Exam   GEN: Elderly male. No acute distress.   Neck: No JVD. Cardiac:  Irregular, no gallop. Respiratory: Nonlabored. Clear to auscultation bilaterally. GI: Soft, nontender, bowel sounds present. MS: No edema; No deformity.  Labs    Chemistry  Recent Labs Lab 03/13/17 0451 03/14/17 0345 03/15/17 0342  NA 131* 135 136  K 3.5 3.6 3.7  CL 104 107 108  CO2 19* 20* 22  GLUCOSE 102* 103* 106*  BUN  38* 25* 18  CREATININE 1.54* 1.38* 1.32*  CALCIUM 8.0* 8.0* 8.1*  PROT 5.5* 5.2* 5.5*  ALBUMIN 2.7* 2.6* 2.5*  AST 33 33 36  ALT 26 30 34  ALKPHOS 59 58 65  BILITOT 0.7 0.9 0.7  GFRNONAA 41* 47* 50*  GFRAA 48* 55* 58*  ANIONGAP 8 8 6      Hematology  Recent Labs Lab 03/13/17 0451 03/14/17 0345 03/15/17 0342  WBC 6.4 5.3 5.6  RBC 3.77* 3.72* 3.88*  HGB 10.1* 10.0* 10.5*  HCT 31.2* 31.3* 32.2*  MCV 82.8 84.1 83.0  MCH 26.8 26.9 27.1  MCHC 32.4 31.9 32.6  RDW 19.1* 19.5* 19.1*  PLT 150 129* 147*    Cardiac Enzymes  Recent Labs Lab 03/12/17 1637 03/12/17 2158 03/13/17 0451  TROPONINI 0.03* 0.03* 0.03*     Recent Labs Lab 03/12/17 1559  TROPIPOC 0.03     Radiology    No results found.  Cardiac Studies   Echocardiogram 09/16/2016: Study Conclusions  - Left ventricle: The cavity size was normal. Wall thickness was   increased in a pattern of moderate LVH. Systolic  function was   normal. The estimated ejection fraction was in the range of 50%   to 55%. The study is not technically sufficient to allow   evaluation of LV diastolic function. - Ventricular septum: Septal motion showed paradox. - Mitral valve: There was moderate regurgitation. - Left atrium: The appendage was severely dilated. - Right ventricle: The cavity size was moderately dilated. - Right atrium: The atrium was moderately dilated. - Atrial septum: No defect or patent foramen ovale was identified. - Tricuspid valve: There was moderate regurgitation. - Pulmonary arteries: PA peak pressure: 62 mm Hg (S).  Patient Profile     81 y.o. male with a history of CAD status post CABG in 2006, PAF on Eliquis status post cardioversion in April, hypertension, and hyperlipidemia. He is now being managed for atrial flutter with symptoms including weakness and fatigue. Currently on IV amiodarone to assist with heart rate control and plan for TEE guided cardioversion on Monday. He has been on Eliquis but  missed 2 doses recently.   Assessment & Plan    1. Typical atrial flutter with variable conduction. CHADSVASC score is 5. Continues on Eliquis and IV amiodarone. Telemetry shows possible sinus rhythm with frequent PACs, need follow-up ECG.  2. CAD status post CABG in 2006. No active angina symptoms. Troponin I levels equivocal at 0.03 and in flat pattern.  3. History of orthostatic hypotension, has limited therapy with beta blocker.  4. Acute on chronic CKD stage 3. Creatinine down to 1.32.  5. History of atrial fibrillation status post cardioversion in April of this year.  6. Newly noted 7 mm mid left lung nodule most suggestive of granuloma but not seen on prior images.  Check follow-up ECG confirmed rhythm. Will otherwise continue Eliquis and IV amiodarone. May want to ultimately get EP opinion regarding whether standing antiarrhythmic therapy (oral amiodarone) or even potentially ablation of atrial flutter might be consideration. He has had both atrial fibrillation and atrial flutter documented this year.  Signed, Rozann Lesches, MD  03/15/2017, 12:43 PM

## 2017-03-15 NOTE — Evaluation (Signed)
Occupational Therapy Evaluation Patient Details Name: Carl [name] MRN: [id number] DOB: [birth year] Today's Date: 03/15/2017    History of Present Illness 81 yo admitted with weakness, fatigue, Afib. PMHx: CAD, CABG, CKD, PAF, orthostatic hypotension, prostate CA   Clinical Impression   Pt reports he was independent with ADL PTA. Currently pt requires min assist for short distance functional mobility with HR elevated to 156 bpm; returned to 110s in sitting. Pt requires min guard assist overall for ADL. Pt planning to d/c home with supervision from family. Recommending HHOT for follow up to maximize independence and safety with ADL and functional mobility upon return home. Pt would benefit from continued skilled OT to address established goals.    Follow Up Recommendations  Home health OT;Supervision/Assistance - 24 hour    Equipment Recommendations  None recommended by OT    Recommendations for Other Services       Precautions / Restrictions Precautions Precautions: Fall Restrictions Weight Bearing Restrictions: No      Mobility Bed Mobility Overal bed mobility: Needs Assistance Bed Mobility: Supine to Sit     Supine to sit: Mod assist     General bed mobility comments: Mod hand held assist for trunk elevation to sitting.  Transfers Overall transfer level: Needs assistance Equipment used: None Transfers: Sit to/from Stand Sit to Stand: Min guard         General transfer comment: Min guard for safety with sit to stand from EOB x1.     Balance Overall balance assessment: Needs assistance Sitting-balance support: Feet supported;No upper extremity supported Sitting balance-Leahy Scale: Good     Standing balance support: No upper extremity supported;During functional activity Standing balance-Leahy Scale: Poor Standing balance comment: min assist for balance with dynamic standing                           ADL either performed or assessed with  clinical judgement   ADL Overall ADL's : Needs assistance/impaired Eating/Feeding: Set up;Sitting   Grooming: Set up;Sitting;Supervision/safety   Upper Body Bathing: Set up;Supervision/ safety;Sitting   Lower Body Bathing: Min guard;Sit to/from stand   Upper Body Dressing : Set up;Supervision/safety;Sitting   Lower Body Dressing: Min guard;Sit to/from stand   Toilet Transfer: Minimal assistance;Ambulation           Functional mobility during ADLs: Minimal assistance General ADL Comments: HR up to 156 bpm max; resting in 110s in seated after 3 minutes.     Vision         Perception     Praxis      Pertinent Vitals/Pain Pain Assessment: No/denies pain     Hand Dominance     Extremity/Trunk Assessment Upper Extremity Assessment Upper Extremity Assessment: Overall WFL for tasks assessed   Lower Extremity Assessment Lower Extremity Assessment: Defer to PT evaluation   Cervical / Trunk Assessment Cervical / Trunk Assessment: Kyphotic   Communication Communication Communication: No difficulties   Cognition Arousal/Alertness: Awake/alert Behavior During Therapy: Flat affect Overall Cognitive Status: Within Functional Limits for tasks assessed                                     General Comments       Exercises     Shoulder Instructions      Home Living Family/patient expects to be discharged to:: Private residence Living Arrangements: Children;Other relatives Available Help at Discharge:  Family Type of Home: House Home Access: Stairs to enter [employer] of Steps: 6 Entrance Stairs-Rails: Right Home Layout: One level     Bathroom Shower/Tub: Occupational psychologist: Standard     Home Equipment: Shower seat          Prior Functioning/Environment Level of Independence: Independent                 OT Problem List: Decreased activity tolerance;Impaired balance (sitting and/or standing);Decreased  knowledge of use of DME or AE;Cardiopulmonary status limiting activity      OT Treatment/Interventions: Self-care/ADL training;Energy conservation;DME and/or AE instruction;Therapeutic activities;Balance training;Patient/family education    OT Goals(Current goals can be found in the care plan section) Acute Rehab OT Goals Patient Stated Goal: return home OT Goal Formulation: With patient Time For Goal Achievement: 03/29/17 Potential to Achieve Goals: Good ADL Goals Pt Will Perform Grooming: with modified independence;standing (x3 tasks) Pt Will Transfer to Toilet: with modified independence;ambulating;regular height toilet Pt Will Perform Toileting - Clothing Manipulation and hygiene: with modified independence;sit to/from stand Pt Will Perform Tub/Shower Transfer: Shower transfer;with modified independence;ambulating;shower seat Additional ADL Goal #1: Pt will independently verbally recall 3 energy conservation strategies and use during ADL. Additional ADL Goal #2: Pt will gather ADL items and perform UB/LB dressing/bathing with mod I.  OT Frequency: Min 2X/week   Barriers to D/C:            Co-evaluation              AM-PAC PT "6 Clicks" Daily Activity     Outcome Measure Help from another person eating meals?: None Help from another person taking care of personal grooming?: A Little Help from another person toileting, which includes using toliet, bedpan, or urinal?: A Little Help from another person bathing (including washing, rinsing, drying)?: A Little Help from another person to put on and taking off regular upper body clothing?: None Help from another person to put on and taking off regular lower body clothing?: A Little 6 Click Score: 20   End of Session Equipment Utilized During Treatment: Gait belt Nurse Communication: Mobility status;Other (comment) (elevated HR with mobility)  Activity Tolerance: Patient tolerated treatment well Patient left: in chair;with  call bell/phone within reach  OT Visit Diagnosis: Unsteadiness on feet (R26.81)                Time: 4944-9675 OT Time Calculation (min): 18 min Charges:  OT General Charges $OT Visit: 1 Procedure OT Evaluation $OT Eval Moderate Complexity: 1 Procedure G-Codes:     Steffi Noviello A. Ulice Brilliant, M.S., OTR/L Pager: [place] 03/15/2017, 8:36 AM

## 2017-03-16 ENCOUNTER — Encounter (HOSPITAL_COMMUNITY): Payer: Self-pay | Admitting: Certified Registered"

## 2017-03-16 ENCOUNTER — Other Ambulatory Visit (HOSPITAL_COMMUNITY): Payer: Medicare Other

## 2017-03-16 ENCOUNTER — Encounter (HOSPITAL_COMMUNITY)
Admission: EM | Disposition: A | Discharge status: Home / Self Care | Payer: Self-pay | Source: Home / Self Care | Attending: Internal Medicine

## 2017-03-16 LAB — COMPREHENSIVE METABOLIC PANEL
ALT: 31 U/L (ref 17–63)
AST: 28 U/L (ref 15–41)
Albumin: 2.4 g/dL — ABNORMAL LOW (ref 3.5–5.0)
Alkaline Phosphatase: 77 U/L (ref 38–126)
Anion gap: 5 (ref 5–15)
BUN: 13 mg/dL (ref 6–20)
CO2: 23 mmol/L (ref 22–32)
Calcium: 8 mg/dL — ABNORMAL LOW (ref 8.9–10.3)
Chloride: 111 mmol/L (ref 101–111)
Creatinine, Ser: 1.19 mg/dL (ref 0.61–1.24)
GFR calc Af Amer: >60 mL/min (ref >60)
GFR calc non Af Amer: 56 mL/min — ABNORMAL LOW (ref >60)
Glucose, Bld: 94 mg/dL (ref 65–99)
Potassium: 3.6 mmol/L (ref 3.5–5.1)
Sodium: 139 mmol/L (ref 135–145)
Total Bilirubin: 0.5 mg/dL (ref 0.3–1.2)
Total Protein: 5.6 g/dL — ABNORMAL LOW (ref 6.5–8.1)

## 2017-03-16 LAB — CBC
HCT: 30.9 % — ABNORMAL LOW (ref 39.0–52.0)
Hemoglobin: 10 g/dL — ABNORMAL LOW (ref 13.0–17.0)
MCH: 27.5 pg (ref 26.0–34.0)
MCHC: 32.4 g/dL (ref 30.0–36.0)
MCV: 84.9 fL (ref 78.0–100.0)
Platelets: 170 10*3/uL (ref 150–400)
RBC: 3.64 MIL/uL — ABNORMAL LOW (ref 4.22–5.81)
RDW: 19.6 % — ABNORMAL HIGH (ref 11.5–15.5)
WBC: 5.5 10*3/uL (ref 4.0–10.5)

## 2017-03-16 SURGERY — CANCELLED PROCEDURE

## 2017-03-16 MED ORDER — AMIODARONE HCL 200 MG PO TABS
400.0000 mg | ORAL_TABLET | Freq: Two times a day (BID) | ORAL | Status: DC
  Administered 2017-03-16 – 2017-03-17 (×3): 400 mg via ORAL
  Filled 2017-03-16 (×3): qty 2

## 2017-03-16 NOTE — Progress Notes (Signed)
Progress Note  Patient Name: Carl [name] Date of Encounter: 03/16/2017  Primary Cardiologist: Dr. Marlou Porch  Subjective   No chest pain or SOB today.  Since off Flomax no problems with voiding.  Will check orthostatic BPs today.      Inpatient Medications    Scheduled Meds: . apixaban  5 mg Oral BID  . bicalutamide  50 mg Oral QHS  . ciprofloxacin  500 mg Oral BID  . docusate sodium  100 mg Oral BID  . pantoprazole  40 mg Oral Daily  . rosuvastatin  40 mg Oral q1800   Continuous Infusions: . sodium chloride    . amiodarone 30 mg/hr (03/15/17 1207)   PRN Meds: acetaminophen, ondansetron **OR** ondansetron (ZOFRAN) IV, polyethylene glycol   Vital Signs    Vitals:   03/15/17 0726 03/15/17 1113 03/15/17 1554 03/16/17 0720  BP: 111/65 118/82 (!) 109/57 132/69  Pulse: 65 83 81 76  Resp: 18 18 20 18   Temp: 97.7 F (36.5 C) 99.3 F (37.4 C) 97.8 F (36.6 C) 98.2 F (36.8 C)  TempSrc: Oral Oral Oral Oral  SpO2: 98% 100% 98% 100%  Weight:      Height:        Intake/Output Summary (Last 24 hours) at 03/16/17 0934 Last data filed at 03/16/17 0723  Gross per 24 hour  Intake              240 ml  Output             1250 ml  Net            -1010 ml   Filed Weights   03/13/17 0520 03/14/17 0344 03/15/17 0339  Weight: 167 lb 15.9 oz (76.2 kg) 165 lb 12.6 oz (75.2 kg) 157 lb 6.5 oz (71.4 kg)    Telemetry    SR with 1st degree AV block PACs and non conducted PACs , one episode of WCT vs. aberrancy  - Personally Reviewed  ECG    03/15/17 SR with 1st degree AV block  - Personally Reviewed 03/16/17 SR with 1st degree AV block  PR 230 ms also personally reviewed. Physical Exam   GEN: No acute distress.   Neck: No JVD Cardiac: RRR, and premature beats, no murmurs, rubs, or gallops.  Respiratory: Clear to auscultation bilaterally. GI: Soft, nontender, non-distended  MS: No edema; No deformity. Neuro:  Nonfocal  Psych: Normal to flat affect   Labs     Chemistry Recent Labs Lab 03/14/17 0345 03/15/17 0342 03/16/17 0435  NA 135 136 139  K 3.6 3.7 3.6  CL 107 108 111  CO2 20* 22 23  GLUCOSE 103* 106* 94  BUN 25* 18 13  CREATININE 1.38* 1.32* 1.19  CALCIUM 8.0* 8.1* 8.0*  PROT 5.2* 5.5* 5.6*  ALBUMIN 2.6* 2.5* 2.4*  AST 33 36 28  ALT 30 34 31  ALKPHOS 58 65 77  BILITOT 0.9 0.7 0.5  GFRNONAA 47* 50* 56*  GFRAA 55* 58* >60  ANIONGAP 8 6 5      Hematology Recent Labs Lab 03/14/17 0345 03/15/17 0342 03/16/17 0435  WBC 5.3 5.6 5.5  RBC 3.72* 3.88* 3.64*  HGB 10.0* 10.5* 10.0*  HCT 31.3* 32.2* 30.9*  MCV 84.1 83.0 84.9  MCH 26.9 27.1 27.5  MCHC 31.9 32.6 32.4  RDW 19.5* 19.1* 19.6*  PLT 129* 147* 170    Cardiac Enzymes Recent Labs Lab 03/12/17 1637 03/12/17 2158 03/13/17 0451  TROPONINI 0.03* 0.03* 0.03*  Recent Labs Lab 03/12/17 1559  TROPIPOC 0.03     BNPNo results for input(s): BNP, PROBNP in the last 168 hours.   DDimer No results for input(s): DDIMER in the last 168 hours.   Radiology    No results found.  Cardiac Studies   Echocardiogram 09/16/2016: Study Conclusions  - Left ventricle: The cavity size was normal. Wall thickness was increased in a pattern of moderate LVH. Systolic function was normal. The estimated ejection fraction was in the range of 50% to 55%. The study is not technically sufficient to allow evaluation of LV diastolic function. - Ventricular septum: Septal motion showed paradox. - Mitral valve: There was moderate regurgitation. - Left atrium: The appendage was severely dilated. - Right ventricle: The cavity size was moderately dilated. - Right atrium: The atrium was moderately dilated. - Atrial septum: No defect or patent foramen ovale was identified. - Tricuspid valve: There was moderate regurgitation. - Pulmonary arteries: PA peak pressure: 62 mm Hg (S).  Patient Profile     81 y.o. male with a history of CAD status post CABG in 2006, PAF on Eliquis  status post cardioversion in April, hypertension, and hyperlipidemia. He is now being managed for atrial flutter with symptoms including weakness and fatigue. Currently on IV amiodarone to assist with heart rate control and plan for TEE guided cardioversion on Monday. He has been on Eliquis but missed 2 doses recently.   Assessment & Plan    Typical a flutter with variable conduction on admit  CHADSVASC score is 5. Continues on Eliquis --now SR with PACs and non conducted PACs.  On IV amiodarone  Hx of persistent AF with DCCV in April.  Off BB due to orthostatic hypotension.  Will recheck, also stopped flomax.  --was to be seen in a fib clinic tomorrow will reschedule or to follow with Dr. Marlou Porch.  --change IV amiodarone to po   (TSH 1.445, LFTs normal)  Has had with load 2250 mg since the 16th.   --was for TEE DCCV but cancelled for SR   CAD hx of CABG in 2006 with LIMA-LAD, SVG-Ramus, SVG-OM, and seqSVG-PLV-PDA no chest pain.  Neg troponin  Orthostatic hypotension on 03/05/17 with tachycardia with exertion but EKG with SR with PACs.  His BB had been stopped due to hypotension.  flomax had been held.  Will recheck.  Acute on chronic CKD stage 3 Cr down to 1.19 today.  From 1.54.   UTI on ABX per IM    Signed, Cecilie Kicks, NP  03/16/2017, 9:34 AM    Personally seen and examined. Agree with above.  Feels better. No CP, no SOB RRR, CTAB, alert, no edema  Typical atrial flutter  - continue with PO amio 400mg  PO BID for 7 days then 200mg  BID for 7 days then 200mg  PO QD thereafter.   - If returns, consider ablation.  - Eliquis  CAD  - stable post CABG  Orthostatic hypotension  - likely improved after stopping flomax and restoration of NSR.   Candee Furbish, MD

## 2017-03-16 NOTE — Anesthesia Preprocedure Evaluation (Deleted)
Anesthesia Evaluation    Airway        Dental   Pulmonary former smoker,           Cardiovascular hypertension, + CAD and + CABG  + dysrhythmias Atrial Fibrillation   2/18 ECHO: EF 50-55%, mod MR, mod TR   Neuro/Psych    GI/Hepatic GERD  ,  Endo/Other    Renal/GU    Prostate cancer    Musculoskeletal   Abdominal   Peds  Hematology eliquis   Anesthesia Other Findings   Reproductive/Obstetrics                             Anesthesia Physical Anesthesia Plan Anesthesia Quick Evaluation

## 2017-03-16 NOTE — Progress Notes (Signed)
[facility] TEAM 1 - Stepdown/ICU TEAM  Carl [name]  XHB:[id number] DOB: 1935/11/22 DOA: 03/12/2017 PCP: [name], MD    Brief Narrative:  81 y.o. male with a Hx of CAD status post 5 vessel CABG, prostate cancer, and atrial fibrillation/flutter initially diagnosed 10/2016 status post DCCV, on Eliquis, followed by Dr. Marlou Porch, who was admitted 03/12/17 after he presented to his PCP office with a chief complaint of palpitations. He was directed to the ED where he was found to be in atrial fibrillation with RVR. Heart rate control was achieved with Cardizem drip.  Subjective: Resting comfortably in bed.  Patient is happy he will not require cardioversion today.  He denies chest pain shortness breath fevers chills nausea or vomiting.  He tells me that he lives in a private home with multiple family members who are always there to assist him.  Assessment & Plan:  Atrial fibrillation with RVR / Anticoagulated CHA2DS2-VASc is 5 - on chronic Eliquis - s/p DCCV 10/2016 with no known recurrence since - rate control achieved with Cardizem drip - has not been able to tolerate low dose beta blocker therapy previously due to orthostasis - Cardiology initiated treatment with amio IV, and is now transitioning to oral - TEE/DCCV planned for today has been canceled as pt is back in NSR presently - Cards directing care of this issue - see Cards note for amio tapering dose schedule   Pseudomonas UTI Culture now confirming greater than 100,000 colonies of Pseudomonas - Cipro sensitivity confirmed - cont oral Cipro and follow clinically  CABG x 5 2006 / ischemic cardiomyopathy not on ASA as he is on eliquis -  continue statin (LDL 40) - Troponin 0.033.  Dyslipidemia Continue statin  Generalized weakness OT suggests HHOT w/ 24hr assit - PT suggests HHPT - ambulate today - possible d/c next 24-48hrs   Essential hypertension / orthostatic hypotension BP controlled - no c/o orthostasis off flomax  but has been mostly bedbound - ambulate more and follow - watch for retention off flomax   CKD stage 3 crt improved w/ gently hydration and rate control   Recent Labs Lab 03/12/17 1220 03/13/17 0451 03/14/17 0345 03/15/17 0342 03/16/17 0435  CREATININE 1.95* 1.54* 1.38* 1.32* 1.19   History of prostate cancer Continue Casodex  Hyponatremia Appeared dry on admission - resolved w/ NS hydration   7 mm nodule in the left midlung Appears to be consistent with granuloma - will need follow-up outpt radiographs to ensure stability  Normocytic anemia Likely anemia of chronic kidney disease - Hgb stable w/o evidence of blood loss   DVT prophylaxis: eliquis Code Status: FULL CODE Family Communication: no family present at time of exam  Disposition Plan: ambulate and watch HR and rhythm in doing so - monitor for urinary retention - possible d/c next 24-48hrs if HR controlled and pt stable on feet   Consultants:  Cardiology   Procedures: none  Antimicrobials:  Ceftriaxone 8/18 Zosyn 8/18 > 8/19 Ciprofloxacin 8/19 >  Objective: Blood pressure 132/69, pulse 76, temperature 98.2 F (36.8 C), temperature source Oral, resp. rate 18, height 5\' 8"  (1.727 m), weight 71.4 kg (157 lb 6.5 oz), SpO2 100 %.  Intake/Output Summary (Last 24 hours) at 03/16/17 1102 Last data filed at 03/16/17 0723  Gross per 24 hour  Intake              240 ml  Output             1250 ml  Net            -1010 ml   Filed Weights   03/13/17 0520 03/14/17 0344 03/15/17 0339  Weight: 76.2 kg (167 lb 15.9 oz) 75.2 kg (165 lb 12.6 oz) 71.4 kg (157 lb 6.5 oz)    Examination: General: No acute respiratory distress - alert and conversant  Lungs: CTA B w/o wheeze or crackles  Cardiovascular: RRR w/o M or rub  Abdomen: NT/ND, soft, bs+, no rebound  Extremities: No edema B LE   CBC:  Recent Labs Lab 03/12/17 1220 03/13/17 0451 03/14/17 0345 03/15/17 0342 03/16/17 0435  WBC 11.5* 6.4 5.3 5.6 5.5    HGB 11.3* 10.1* 10.0* 10.5* 10.0*  HCT 34.6* 31.2* 31.3* 32.2* 30.9*  MCV 83.2 82.8 84.1 83.0 84.9  PLT 176 150 129* 147* 854   Basic Metabolic Panel:  Recent Labs Lab 03/12/17 1220 03/13/17 0451 03/14/17 0345 03/15/17 0342 03/16/17 0435  NA 132* 131* 135 136 139  K 4.0 3.5 3.6 3.7 3.6  CL 103 104 107 108 111  CO2 17* 19* 20* 22 23  GLUCOSE 107* 102* 103* 106* 94  BUN 40* 38* 25* 18 13  CREATININE 1.95* 1.54* 1.38* 1.32* 1.19  CALCIUM 8.3* 8.0* 8.0* 8.1* 8.0*  MG 1.8 1.8 1.8  --   --   PHOS  --  2.8 2.6  --   --    GFR: Estimated Creatinine Clearance: 48.7 mL/min (by C-G formula based on SCr of 1.19 mg/dL).  Liver Function Tests:  Recent Labs Lab 03/13/17 0451 03/14/17 0345 03/15/17 0342 03/16/17 0435  AST 33 33 36 28  ALT 26 30 34 31  ALKPHOS 59 58 65 77  BILITOT 0.7 0.9 0.7 0.5  PROT 5.5* 5.2* 5.5* 5.6*  ALBUMIN 2.7* 2.6* 2.5* 2.4*    Cardiac Enzymes:  Recent Labs Lab 03/12/17 1637 03/12/17 2158 03/13/17 0451  TROPONINI 0.03* 0.03* 0.03*    HbA1C: Hgb A1c MFr Bld  Date/Time Value Ref Range Status  03/12/2017 04:37 PM 5.5 4.8 - 5.6 % Final    Comment:    (NOTE) Pre diabetes:          5.7%-6.4% Diabetes:              >6.4% Glycemic control for   <7.0% adults with diabetes      Recent Results (from the past 240 hour(s))  Urine Culture     Status: Abnormal   Collection Time: 03/12/17  3:06 PM  Result Value Ref Range Status   Specimen Description URINE, CLEAN CATCH  Final   Special Requests NONE  Final   Culture >=100,000 COLONIES/mL PSEUDOMONAS AERUGINOSA (A)  Final   Report Status 03/15/2017 FINAL  Final   Organism ID, Bacteria PSEUDOMONAS AERUGINOSA (A)  Final      Susceptibility   Pseudomonas aeruginosa - MIC*    CEFTAZIDIME 4 SENSITIVE Sensitive     CIPROFLOXACIN <=0.25 SENSITIVE Sensitive     GENTAMICIN 2 SENSITIVE Sensitive     IMIPENEM 2 SENSITIVE Sensitive     PIP/TAZO 8 SENSITIVE Sensitive     CEFEPIME 2 SENSITIVE Sensitive      * >=100,000 COLONIES/mL PSEUDOMONAS AERUGINOSA  Culture, blood (Routine X 2) w Reflex to ID Panel     Status: None (Preliminary result)   Collection Time: 03/12/17  3:46 PM  Result Value Ref Range Status   Specimen Description BLOOD RIGHT ANTECUBITAL  Final   Special Requests   Final    BOTTLES DRAWN AEROBIC  AND ANAEROBIC Blood Culture adequate volume   Culture NO GROWTH 3 DAYS  Final   Report Status PENDING  Incomplete  Culture, blood (Routine X 2) w Reflex to ID Panel     Status: None (Preliminary result)   Collection Time: 03/12/17  3:46 PM  Result Value Ref Range Status   Specimen Description BLOOD RIGHT HAND  Final   Special Requests   Final    BOTTLES DRAWN AEROBIC AND ANAEROBIC Blood Culture adequate volume   Culture NO GROWTH 3 DAYS  Final   Report Status PENDING  Incomplete  MRSA PCR Screening     Status: None   Collection Time: 03/12/17  4:30 PM  Result Value Ref Range Status   MRSA by PCR NEGATIVE NEGATIVE Final    Comment:        The GeneXpert MRSA Assay (FDA approved for NASAL specimens only), is one component of a comprehensive MRSA colonization surveillance program. It is not intended to diagnose MRSA infection nor to guide or monitor treatment for MRSA infections.      Scheduled Meds: . apixaban  5 mg Oral BID  . bicalutamide  50 mg Oral QHS  . ciprofloxacin  500 mg Oral BID  . docusate sodium  100 mg Oral BID  . pantoprazole  40 mg Oral Daily  . rosuvastatin  40 mg Oral q1800     LOS: 4 days   Cherene Altes, MD [facility] Office  [phone] Pager - Text Page per Amion as per below:  On-Call/Text Page:      [email or web address]      password [email or web address]  If 7PM-7AM, please contact night-coverage [email or web address] Password [email or web address] 03/16/2017, 11:02 AM

## 2017-03-16 NOTE — Progress Notes (Signed)
Physical Therapy Treatment Patient Details Name: Carl [name] MRN: [id number] DOB: 08-02-1935 Today's Date: 03/16/2017    History of Present Illness 81 yo admitted with weakness, fatigue, Afib. PMHx: CAD, CABG, CKD, PAF, orthostatic hypotension, prostate CA    PT Comments    Pt making slow progress towards goals. Pt lethargic during session. Pt currently, minA for bed mobility, transfers and ambulation of 100 feet with HHA.    Follow Up Recommendations  Home health PT     Equipment Recommendations  Cane    Recommendations for Other Services OT consult     Precautions / Restrictions Precautions Precautions: Fall Restrictions Weight Bearing Restrictions: No    Mobility  Bed Mobility Overal bed mobility: Needs Assistance Bed Mobility: Supine to Sit     Supine to sit: Min assist     General bed mobility comments: minA for trunk to upright  Transfers Overall transfer level: Needs assistance Equipment used: None Transfers: Sit to/from Stand Sit to Stand: Min assist         General transfer comment: minA for steadying once in upright  Ambulation/Gait Ambulation/Gait assistance: Min assist Ambulation Distance (Feet): 100 Feet Assistive device: 1 person hand held assist Gait Pattern/deviations: Shuffle;Decreased stride length;Wide base of support Gait velocity: slowed Gait velocity interpretation: Below normal speed for age/gender General Gait Details: minA HHA for steadying with wide BoS, vc for anterior pelvic tilt,           Balance Overall balance assessment: Needs assistance Sitting-balance support: Feet supported;No upper extremity supported Sitting balance-Leahy Scale: Good     Standing balance support: No upper extremity supported;During functional activity Standing balance-Leahy Scale: Fair                              Cognition Arousal/Alertness: Awake/alert Behavior During Therapy: Flat affect Overall Cognitive Status:  Within Functional Limits for tasks assessed                                           General Comments General comments (skin integrity, edema, etc.): Pt had to be awoken for PT session and was lethargic throughout session. Vitals: at rest BP 121/70, HR 86 bpm, SaO2 on RA 98%O2, with ambulation max HR 107 bpm, after activity BP 117/73, HR 87 bpm, SaO2 on RA 98%O2      Pertinent Vitals/Pain Pain Assessment: No/denies pain           PT Goals (current goals can now be found in the care plan section) Acute Rehab PT Goals Patient Stated Goal: return home PT Goal Formulation: With patient Time For Goal Achievement: 03/27/17 Potential to Achieve Goals: Fair Progress towards PT goals: Progressing toward goals    Frequency    Min 3X/week      PT Plan Current plan remains appropriate    Co-evaluation              AM-PAC PT "6 Clicks" Daily Activity  Outcome Measure  Difficulty turning over in bed (including adjusting bedclothes, sheets and blankets)?: A Little Difficulty moving from lying on back to sitting on the side of the bed? : Unable Difficulty sitting down on and standing up from a chair with arms (e.g., wheelchair, bedside commode, etc,.)?: Unable Help needed moving to and from a bed to chair (including a wheelchair)?: A Little Help needed walking  in hospital room?: A Little Help needed climbing 3-5 steps with a railing? : A Lot 6 Click Score: 13    End of Session Equipment Utilized During Treatment: Gait belt Activity Tolerance: Patient limited by lethargy Patient left: in bed;with call bell/phone within reach Nurse Communication: Mobility status PT Visit Diagnosis: Difficulty in walking, not elsewhere classified (R26.2)     Time: 7209-1980 PT Time Calculation (min) (ACUTE ONLY): 22 min  Charges:  $Gait Training: 8-22 mins                    G Codes:       Oskar Cretella B. Migdalia Dk PT, DPT Acute Rehabilitation  [phone] Pager  [phone]     [place] 03/16/2017, 4:50 PM

## 2017-03-17 ENCOUNTER — Ambulatory Visit: Payer: Medicare Other | Admitting: Podiatry

## 2017-03-17 ENCOUNTER — Telehealth: Payer: Self-pay | Admitting: Family Medicine

## 2017-03-17 DIAGNOSIS — C61 Malignant neoplasm of prostate: Secondary | ICD-10-CM

## 2017-03-17 DIAGNOSIS — I420 Dilated cardiomyopathy: Secondary | ICD-10-CM

## 2017-03-17 DIAGNOSIS — R911 Solitary pulmonary nodule: Secondary | ICD-10-CM

## 2017-03-17 DIAGNOSIS — N3000 Acute cystitis without hematuria: Secondary | ICD-10-CM

## 2017-03-17 DIAGNOSIS — N183 Chronic kidney disease, stage 3 (moderate): Secondary | ICD-10-CM

## 2017-03-17 DIAGNOSIS — B965 Pseudomonas (aeruginosa) (mallei) (pseudomallei) as the cause of diseases classified elsewhere: Secondary | ICD-10-CM

## 2017-03-17 LAB — CULTURE, BLOOD (ROUTINE X 2)
Culture: NO GROWTH
Culture: NO GROWTH
Special Requests: ADEQUATE
Special Requests: ADEQUATE

## 2017-03-17 MED ORDER — AMIODARONE HCL 200 MG PO TABS: ORAL_TABLET | ORAL | 0 refills | Status: DC

## 2017-03-17 MED ORDER — CIPROFLOXACIN HCL 500 MG PO TABS: 500.0000 mg | ORAL_TABLET | Freq: Two times a day (BID) | ORAL | 0 refills | Status: DC

## 2017-03-17 MED ORDER — POTASSIUM CHLORIDE CRYS ER 20 MEQ PO TBCR
40.0000 meq | EXTENDED_RELEASE_TABLET | Freq: Once | ORAL | Status: AC
  Administered 2017-03-17: 40 meq via ORAL
  Filled 2017-03-17: qty 2

## 2017-03-17 NOTE — Discharge Summary (Signed)
Physician Discharge Summary  Carl [name] OEV:[id number] DOB: 30-May-1936 DOA: 03/12/2017  PCP: [name], MD  Admit date: 03/12/2017 Discharge date: 03/17/2017  Time spent: 35 minutes  Recommendations for Outpatient Follow-up:   Atrial fibrillation with RVR CHADSVASC score is 5/ Anticoagulated(CHA2DS2-VASc is 5)  - s/p DCCV 10/2016 with no known recurrence since - has not been able to tolerate low dose beta blocker therapy previously due to orthostasis  - Cardiology initiated treatment with amio IV, and is now transitioning to oral - -Cardiology directing care of this issue - see Cards note for amio tapering dose schedule  -Per cardiology Amiodarone; PO amio 400mg  PO BID for 5 days then 200mg  BID for 7 days then 200mg  PO QD thereafter -Continue Eliquis -Schedule follow-up with Dr. Candee Furbish cardiology for 2 weeks, A. fib with RVR, essential HTN, ischemic cardiomyopathy    Pseudomonas UTI -Culture now confirming greater than 100,000 colonies of Pseudomonas - Cipro sensitivity confirmed - cont oral Cipro and follow clinically -Schedule follow-up 1-2 weeks Dr.Eric [name], UTI, essential HTN, dyslipidemia, CKD stage III    CABG x 5 2006 / ischemic cardiomyopathy -not on ASA as he is on eliquis  -  continue statin (LDL 40)  - Troponin 0.033.   Dyslipidemia Continue statin   Generalized weakness -OT suggests HHOT w/ 24hr assit - PT suggests HHPT     Essential hypertension / orthostatic hypotension -BP controlled - no c/o orthostasis off flomax but has been mostly bedbound    CKD stage 3 -Cr improved w/ gently hydration and rate control    History of prostate cancer -Continue Casodex   Hyponatremia -Appeared dry on admission - resolved w/ NS hydration    7 mm nodule in the left midlung -Appears to be consistent with granuloma - will need follow-up outpt radiographs to ensure stability   Normocytic anemia Likely anemia of chronic kidney disease - Hgb  stable w/o evidence of blood loss        Discharge Diagnoses:  Principal Problem:   Atrial fibrillation with RVR (HCC) Active Problems:   Hx of CABG x 5 '06   Dyslipidemia   Essential hypertension   Murmur   Cardiomyopathy, ischemic   CKD (chronic kidney disease) stage 2, GFR 60-89 ml/min   History of prostate cancer   Persistent atrial fibrillation (HCC)   Anticoagulated   Orthostatic hypotension   Generalized weakness   Leukocytosis   Hyponatremia   Lung nodule seen on imaging study: 7 mm nodule left midlung   Physical deconditioning   Normocytic anemia   Discharge Condition: Stable  Diet recommendation: Heart healthy  Filed Weights   03/14/17 0344 03/15/17 0339 03/17/17 0500  Weight: 165 lb 12.6 oz (75.2 kg) 157 lb 6.5 oz (71.4 kg) 166 lb 7.2 oz (75.5 kg)    History of present illness:  81 y.o. WM PMHx Chronic Anxiety, CAD, s/p 5 vessel CABG, Ischemic Cardiomyopathy HTN, HLD, Prostate cancer, Atrial Fibrillation followed by Dr. Marlou Porch    Presented to ED from PCP office for palpations.  He has been complaining of generalized weakness and malaise.  Reports occasional heart palpitations when he is active. He was seen by his doctor and was placed on a Holter monitoring. He returned today for a follow-up and was sent here due to a heart rate of 160. Patient received IV fluid prior to arrival and states he feels better. Patient is a poor historian. He does have history of atrial fibrillation, currently on Eliquis.  He denies  any fever, headache, lightheadedness, dizziness, chest pain, shortness of breath, abdominal pain, focal numbness/weakness. Did report that he was taken off some blood pressure medication a month ago but no recent medication changes.  No other complaints.   Previous hospitalization patient was treated for A. fib with RVR responded well to the protocol recommended by cardiology. In addition patient was treated for Pseudomonas UTI with appropriate antibiotics.  Patient stable for discharge home.   Procedures: None  Consultations: Cardiology   Cultures   8/16 urine positive pseudomonas aeruginosa 8/16 blood negative 8/16 MRSA by PCR negative    Antibiotics Anti-infectives    Start     Stop   03/17/17 0000  ciprofloxacin (CIPRO) 500 MG tablet  Status:  Discontinued     03/24/17    03/15/17 2000  ciprofloxacin (CIPRO) tablet 500 mg  Status:  Discontinued     03/17/17 2037   03/15/17 0000  piperacillin-tazobactam (ZOSYN) IVPB 3.375 g  Status:  Discontinued     03/15/17 1403   03/14/17 1645  piperacillin-tazobactam (ZOSYN) IVPB 3.375 g     03/14/17 1715   03/14/17 1145  cefTRIAXone (ROCEPHIN) 1 g in dextrose 5 % 50 mL IVPB  Status:  Discontinued     03/14/17 1633       Discharge Exam: Vitals:   03/17/17 0500 03/17/17 0844 03/17/17 1159 03/17/17 1624  BP:   113/68 125/78  Pulse:   75 82  Resp:   15 16  Temp:  98.2 F (36.8 C) 98.1 F (36.7 C) 98.2 F (36.8 C)  TempSrc:  Oral Oral Oral  SpO2:   96% 98%  Weight: 166 lb 7.2 oz (75.5 kg)     Height:        General: A/O 4, negative acute respiratory distress Cardiovascular: Regular rhythm and rate, negative murmurs rubs or gallops, normal S1 and S2 Respiratory: Clear to auscultation bilaterally without wheezes or crackles  Discharge Instructions   Allergies as of 03/17/2017   No Known Allergies     Medication List    STOP taking these medications   furosemide 20 MG tablet Commonly known as:  LASIX     TAKE these medications   amiodarone 200 MG tablet Commonly known as:  PACERONE Per cardiology Amiodarone;  400mg  PO twice a day for 5 days;  then 200mg  Twice a day for 7 days then 200mg   Once per day thereafter   apixaban 5 MG Tabs tablet Commonly known as:  ELIQUIS Take 1 tablet (5 mg total) by mouth 2 (two) times daily.   bicalutamide 50 MG tablet Commonly known as:  CASODEX Take 50 mg by mouth daily with breakfast.   ciprofloxacin 500 MG tablet Commonly  known as:  CIPRO Take 1 tablet (500 mg total) by mouth 2 (two) times daily.   omeprazole 20 MG capsule Commonly known as:  PRILOSEC TAKE ONE CAPSULE BY MOUTH EVERY DAY What changed:  See the new instructions.   rosuvastatin 40 MG tablet Commonly known as:  CRESTOR TAKE 1 TABLET BY MOUTH EVERY DAY What changed:  See the new instructions.   TYLENOL 500 MG tablet Generic drug:  acetaminophen Take 500 mg by mouth every 6 (six) hours as needed (for pain).      No Known Allergies Follow-up Information    Jerline Pain, MD. Schedule an appointment as soon as possible for a visit in 2 weeks.   Specialty:  Cardiology Why:  Schedule follow-up with Dr. Candee Furbish cardiology for 2 weeks,  A. fib with RVR, essential HTN      For Sept.5  2018 @10  AM  Contact information: [address] [place] [address] [phone]        [name], MD. Schedule an appointment as soon as possible for a visit in 2 weeks.   Specialty:  Family Medicine Why:  Schedule follow-up 1-2 weeks Dr.Eric [name], UTI, essential HTN, dyslipidemia, Aug. 28 , 2018 @11 :15 Am Contact information: [address] [place] [address] [phone]            The results of significant diagnostics from this hospitalization (including imaging, microbiology, ancillary and laboratory) are listed below for reference.    Significant Diagnostic Studies: Dg Chest 2 View  Result Date: 03/12/2017 CLINICAL DATA:  Generalize weakness. Atrial fibrillation with tachycardia. EXAM: CHEST  2 VIEW COMPARISON:  04/08/2005 and previous FINDINGS: Previous median sternotomy and CABG. Heart size is normal. There is aortic atherosclerosis. The pulmonary vascularity is normal. No infiltrate, collapse or effusion. There is a 7 mm nodule in the left mid lung that looks dense and is most consistent with a granuloma, but I cannot identify this on any of the previous chest radiographs as distant as  04/02/2005. IMPRESSION: No active cardiopulmonary disease.  No evidence of heart failure. Aortic atherosclerosis. Newly seen 7 mm nodule in the left mid lung. This appears well circumscribed and is dense, most consistent with a granuloma. However, I cannot define this on any of the images from 2006. Even given that, the strong likelihood is that this is benign. This could be followed on subsequent radiographs. Electronically Signed   By: Nelson Chimes M.D.   On: 03/12/2017 14:01   Dg Chest Port 1 View  Result Date: 03/13/2017 CLINICAL DATA:  Atrial fibrillation. EXAM: PORTABLE CHEST 1 VIEW COMPARISON:  03/12/2017. FINDINGS: Prior CABG. Heart size normal. No focal infiltrate. No pleural effusion or pneumothorax. Stable nodular density in the left mid lung again most likely a calcified granulomas noted on prior study. As noted on prior study this can be followed on subsequent exams to demonstrate stability. No pleural effusion or pneumothorax. IMPRESSION: 1. Prior CABG.  Heart size stable. 2. Stable nodular opacity left mid lung most likely a calcified granuloma. As noted on prior study this could be followed on subsequent exams to demonstrate stability. Electronically Signed   By: Marcello Moores  Register   On: 03/13/2017 06:58    Microbiology: Recent Results (from the past 240 hour(s))  Urine Culture     Status: Abnormal   Collection Time: 03/12/17  3:06 PM  Result Value Ref Range Status   Specimen Description URINE, CLEAN CATCH  Final   Special Requests NONE  Final   Culture >=100,000 COLONIES/mL PSEUDOMONAS AERUGINOSA (A)  Final   Report Status 03/15/2017 FINAL  Final   Organism ID, Bacteria PSEUDOMONAS AERUGINOSA (A)  Final      Susceptibility   Pseudomonas aeruginosa - MIC*    CEFTAZIDIME 4 SENSITIVE Sensitive     CIPROFLOXACIN <=0.25 SENSITIVE Sensitive     GENTAMICIN 2 SENSITIVE Sensitive     IMIPENEM 2 SENSITIVE Sensitive     PIP/TAZO 8 SENSITIVE Sensitive     CEFEPIME 2 SENSITIVE Sensitive      * >=100,000 COLONIES/mL PSEUDOMONAS AERUGINOSA  Culture, blood (Routine X 2) w Reflex to ID Panel     Status: None   Collection Time: 03/12/17  3:46 PM  Result Value Ref Range Status   Specimen Description BLOOD  RIGHT ANTECUBITAL  Final   Special Requests   Final    BOTTLES DRAWN AEROBIC AND ANAEROBIC Blood Culture adequate volume   Culture NO GROWTH 5 DAYS  Final   Report Status 03/17/2017 FINAL  Final  Culture, blood (Routine X 2) w Reflex to ID Panel     Status: None   Collection Time: 03/12/17  3:46 PM  Result Value Ref Range Status   Specimen Description BLOOD RIGHT HAND  Final   Special Requests   Final    BOTTLES DRAWN AEROBIC AND ANAEROBIC Blood Culture adequate volume   Culture NO GROWTH 5 DAYS  Final   Report Status 03/17/2017 FINAL  Final  MRSA PCR Screening     Status: None   Collection Time: 03/12/17  4:30 PM  Result Value Ref Range Status   MRSA by PCR NEGATIVE NEGATIVE Final    Comment:        The GeneXpert MRSA Assay (FDA approved for NASAL specimens only), is one component of a comprehensive MRSA colonization surveillance program. It is not intended to diagnose MRSA infection nor to guide or monitor treatment for MRSA infections.      Labs: Basic Metabolic Panel:  Recent Labs Lab 03/12/17 1220 03/13/17 0451 03/14/17 0345 03/15/17 0342 03/16/17 0435  NA 132* 131* 135 136 139  K 4.0 3.5 3.6 3.7 3.6  CL 103 104 107 108 111  CO2 17* 19* 20* 22 23  GLUCOSE 107* 102* 103* 106* 94  BUN 40* 38* 25* 18 13  CREATININE 1.95* 1.54* 1.38* 1.32* 1.19  CALCIUM 8.3* 8.0* 8.0* 8.1* 8.0*  MG 1.8 1.8 1.8  --   --   PHOS  --  2.8 2.6  --   --    Liver Function Tests:  Recent Labs Lab 03/13/17 0451 03/14/17 0345 03/15/17 0342 03/16/17 0435  AST 33 33 36 28  ALT 26 30 34 31  ALKPHOS 59 58 65 77  BILITOT 0.7 0.9 0.7 0.5  PROT 5.5* 5.2* 5.5* 5.6*  ALBUMIN 2.7* 2.6* 2.5* 2.4*   No results for input(s): LIPASE, AMYLASE in the last 168 hours. No results  for input(s): AMMONIA in the last 168 hours. CBC:  Recent Labs Lab 03/12/17 1220 03/13/17 0451 03/14/17 0345 03/15/17 0342 03/16/17 0435  WBC 11.5* 6.4 5.3 5.6 5.5  HGB 11.3* 10.1* 10.0* 10.5* 10.0*  HCT 34.6* 31.2* 31.3* 32.2* 30.9*  MCV 83.2 82.8 84.1 83.0 84.9  PLT 176 150 129* 147* 170   Cardiac Enzymes:  Recent Labs Lab 03/12/17 1637 03/12/17 2158 03/13/17 0451  TROPONINI 0.03* 0.03* 0.03*   BNP: BNP (last 3 results) No results for input(s): BNP in the last 8760 hours.  ProBNP (last 3 results) No results for input(s): PROBNP in the last 8760 hours.  CBG: No results for input(s): GLUCAP in the last 168 hours.     Signed:  Dia Crawford, MD [facility] [phone] pager

## 2017-03-17 NOTE — Progress Notes (Signed)
Discharged home  accompanied by grandson and family, stable, prescription and d/c instructions given to family , belongings taken home.

## 2017-03-17 NOTE — Telephone Encounter (Signed)
HFU, FYI, Dx was UTI/CKB stage 3. Pt is being discharged today. Pt is scheduled. Thank you!

## 2017-03-17 NOTE — Progress Notes (Signed)
Progress Note  Patient Name: Carl [name] Date of Encounter: 03/17/2017  Primary Cardiologist: Dr. Marlou Porch  Subjective   No CP, no SOB  Inpatient Medications    Scheduled Meds: . amiodarone  400 mg Oral BID  . apixaban  5 mg Oral BID  . bicalutamide  50 mg Oral QHS  . ciprofloxacin  500 mg Oral BID  . docusate sodium  100 mg Oral BID  . pantoprazole  40 mg Oral Daily  . potassium chloride  40 mEq Oral Once  . rosuvastatin  40 mg Oral q1800   Continuous Infusions:  PRN Meds: acetaminophen, ondansetron **OR** ondansetron (ZOFRAN) IV, polyethylene glycol   Vital Signs    Vitals:   03/16/17 2000 03/16/17 2328 03/17/17 0354 03/17/17 0500  BP: (!) 119/93 115/68 (!) 143/88   Pulse: 81 73 77   Resp: 16 17 14    Temp: 98.7 F (37.1 C) 98.2 F (36.8 C) 98.6 F (37 C)   TempSrc: Oral Oral Oral   SpO2: 99% 99% 99%   Weight:    166 lb 7.2 oz (75.5 kg)  Height:        Intake/Output Summary (Last 24 hours) at 03/17/17 0843 Last data filed at 03/17/17 0100  Gross per 24 hour  Intake              480 ml  Output             1975 ml  Net            -1495 ml   Filed Weights   03/14/17 0344 03/15/17 0339 03/17/17 0500  Weight: 165 lb 12.6 oz (75.2 kg) 157 lb 6.5 oz (71.4 kg) 166 lb 7.2 oz (75.5 kg)    Telemetry    NSR, occas PAC, artifact - Personally Reviewed  ECG    03/15/17 SR with 1st degree AV block  - Personally Reviewed 03/16/17 SR with 1st degree AV block  PR 230 ms also personally reviewed. Physical Exam   GEN: Well nourished, well developed, in no acute distress  HEENT: normal, wearing wool hat Neck: no JVD, carotid bruits, or masses Cardiac: RRR; no murmurs, rubs, or gallops,no edema, occasional ectopy Respiratory:  clear to auscultation bilaterally, normal work of breathing GI: soft, nontender, nondistended, + BS MS: no deformity or atrophy  Skin: warm and dry, no rash Neuro:  Alert and Oriented x 3, Strength and sensation are intact Psych:  euthymic mood, full affect   Labs    Chemistry  Recent Labs Lab 03/14/17 0345 03/15/17 0342 03/16/17 0435  NA 135 136 139  K 3.6 3.7 3.6  CL 107 108 111  CO2 20* 22 23  GLUCOSE 103* 106* 94  BUN 25* 18 13  CREATININE 1.38* 1.32* 1.19  CALCIUM 8.0* 8.1* 8.0*  PROT 5.2* 5.5* 5.6*  ALBUMIN 2.6* 2.5* 2.4*  AST 33 36 28  ALT 30 34 31  ALKPHOS 58 65 77  BILITOT 0.9 0.7 0.5  GFRNONAA 47* 50* 56*  GFRAA 55* 58* >60  ANIONGAP 8 6 5      Hematology  Recent Labs Lab 03/14/17 0345 03/15/17 0342 03/16/17 0435  WBC 5.3 5.6 5.5  RBC 3.72* 3.88* 3.64*  HGB 10.0* 10.5* 10.0*  HCT 31.3* 32.2* 30.9*  MCV 84.1 83.0 84.9  MCH 26.9 27.1 27.5  MCHC 31.9 32.6 32.4  RDW 19.5* 19.1* 19.6*  PLT 129* 147* 170    Cardiac Enzymes  Recent Labs Lab 03/12/17 1637 03/12/17 2158 03/13/17  0451  TROPONINI 0.03* 0.03* 0.03*     Recent Labs Lab 03/12/17 1559  TROPIPOC 0.03     BNPNo results for input(s): BNP, PROBNP in the last 168 hours.   DDimer No results for input(s): DDIMER in the last 168 hours.   Radiology    No results found.  Cardiac Studies   Echocardiogram 09/16/2016: Study Conclusions  - Left ventricle: The cavity size was normal. Wall thickness was increased in a pattern of moderate LVH. Systolic function was normal. The estimated ejection fraction was in the range of 50% to 55%. The study is not technically sufficient to allow evaluation of LV diastolic function. - Ventricular septum: Septal motion showed paradox. - Mitral valve: There was moderate regurgitation. - Left atrium: The appendage was severely dilated. - Right ventricle: The cavity size was moderately dilated. - Right atrium: The atrium was moderately dilated. - Atrial septum: No defect or patent foramen ovale was identified. - Tricuspid valve: There was moderate regurgitation. - Pulmonary arteries: PA peak pressure: 62 mm Hg (S).  Patient Profile     81 y.o. male with a history  of CAD status post CABG in 2006, PAF on Eliquis status post cardioversion in April, hypertension, and hyperlipidemia. He is now being managed for atrial flutter with symptoms including weakness and fatigue.  Assessment & Plan    Typical a flutter with variable conduction on admit  CHADSVASC score is 5. Continues on Eliquis --now SR with PACs and non conducted PACs.   - continue with PO amio 400mg  PO BID for 5 days then 200mg  BID for 7 days then 200mg  PO QD thereafter.   - If returns, consider ablation.  - Eliquis  - (TSH 1.445, LFTs normal)   CAD hx of CABG in 2006 with LIMA-LAD, SVG-Ramus, SVG-OM, and seqSVG-PLV-PDA no chest pain.  Neg troponin. NO ANGINA. Statin.  Orthostatic hypotension improved after stopping flomax and control of atrial flutter to NSR. Off metoprolol because of this as well. Continue amiodarone.   Acute on chronic CKD stage 3 stable  UTI on ABX per IM   OK with DC from cardiology perspective. Will sign off. Please call if ?  Signed, Candee Furbish, MD  03/17/2017, 8:43 AM

## 2017-03-18 ENCOUNTER — Ambulatory Visit (HOSPITAL_COMMUNITY): Payer: Medicare Other | Admitting: Nurse Practitioner

## 2017-03-19 NOTE — Telephone Encounter (Signed)
Transition Care Management Follow-up Telephone Call  How have you been since you were released from the hospital? Patient BP has been elevated since being home 152/86  Pulse 80 per Granddaughter.   Do you understand why you were in the hospital? Yes   Do you understand the discharge instrcutions? Yes  Items Reviewed:  Medications reviewed: Yes D/C carvedilol added amiodarone   Allergies reviewed: Yes  Dietary changes reviewed: Yes  Referrals reviewed: yes   Functional Questionnaire:   Activities of Daily Living (ADLs):   He states they are independent in the following: In all ADLS States they require assistance with the following: no assist needed at this time.   Any transportation issues/concerns?: No   Any patient concerns? Yes the blood pressure    Confirmed importance and date/time of follow-up visits scheduled: Yes   Confirmed with patient if condition begins to worsen call PCP or go to the ER.  Patient was given the Call-a-Nurse line [phone]: Yes

## 2017-03-24 ENCOUNTER — Encounter: Payer: Self-pay | Admitting: Family Medicine

## 2017-03-24 ENCOUNTER — Ambulatory Visit (INDEPENDENT_AMBULATORY_CARE_PROVIDER_SITE_OTHER): Payer: Medicare Other | Admitting: Family Medicine

## 2017-03-24 VITALS — BP 136/82 | HR 84 | Temp 98.1°F | Wt 168.6 lb

## 2017-03-24 DIAGNOSIS — N179 Acute kidney failure, unspecified: Secondary | ICD-10-CM

## 2017-03-24 DIAGNOSIS — Z8546 Personal history of malignant neoplasm of prostate: Secondary | ICD-10-CM

## 2017-03-24 DIAGNOSIS — I4891 Unspecified atrial fibrillation: Secondary | ICD-10-CM

## 2017-03-24 DIAGNOSIS — G8929 Other chronic pain: Secondary | ICD-10-CM

## 2017-03-24 DIAGNOSIS — R5381 Other malaise: Secondary | ICD-10-CM

## 2017-03-24 DIAGNOSIS — N3000 Acute cystitis without hematuria: Secondary | ICD-10-CM | POA: Diagnosis not present

## 2017-03-24 DIAGNOSIS — M545 Low back pain, unspecified: Secondary | ICD-10-CM

## 2017-03-24 LAB — POCT URINALYSIS DIPSTICK
Bilirubin, UA: NEGATIVE
Glucose, UA: NEGATIVE
Ketones, UA: NEGATIVE
Nitrite, UA: NEGATIVE
Protein, UA: 30
Spec Grav, UA: 1.015 (ref 1.010–1.025)
Urobilinogen, UA: 0.2 E.U./dL
pH, UA: 6 (ref 5.0–8.0)

## 2017-03-24 LAB — BASIC METABOLIC PANEL
BUN: 14 mg/dL (ref 6–23)
CO2: 28 mEq/L (ref 19–32)
Calcium: 8.9 mg/dL (ref 8.4–10.5)
Chloride: 106 mEq/L (ref 96–112)
Creatinine, Ser: 1.15 mg/dL (ref 0.40–1.50)
GFR: 65.13 mL/min (ref >60.00)
Glucose, Bld: 105 mg/dL — ABNORMAL HIGH (ref 70–99)
Potassium: 3.6 mEq/L (ref 3.5–5.1)
Sodium: 141 mEq/L (ref 135–145)

## 2017-03-24 LAB — PSA, MEDICARE: PSA: 3.91 ng/ml (ref 0.10–4.00)

## 2017-03-24 NOTE — Patient Instructions (Signed)
Nice to see you. Please see cardiology next week. We'll get you set up for home health PT and occupational therapy. We'll contact you when your lab results come back. If you develop recurrent palpitations, chest pain, shortness of breath, or numbness, weakness, loss of bowel or bladder function, or numbness between your legs please seek medical attention immediately.

## 2017-03-24 NOTE — Assessment & Plan Note (Signed)
Followed by urology. Does have some back pain. We'll check a PSA and will need to consider imaging of his back depending on PSA results.

## 2017-03-24 NOTE — Assessment & Plan Note (Signed)
Patient has finished treatment for UTI. He is asymptomatic. We'll check a UA.

## 2017-03-24 NOTE — Progress Notes (Signed)
  Carl Rumps, MD Phone: [phone]  Carl [name] is a 81 y.o. male who presents today for hospital follow-up.  Patient reports he is doing quite a bit better. He was diagnosed with a flutter and hospitalized from 03/12/17-03/17/17. He was placed on IV amiodarone and then switched to oral amiodarone and spontaneously converted to normal sinus rhythm. He's noted no chest pain, shortness breath, or palpitations since discharge. He is currently on amiodarone 200 mg twice a day. Also on Eliquis. He was found to have a UTI and was started on Cipro. This was finished 3 days ago. He notes no dysuria, frequency, or urgency. He notes chronic left low back pain that's been occurring over the last year. It is intermittent and sometimes does not bother him. He notes no numbness, weakness, loss of bowel or bladder function, saddle anesthesia, or radiation down his legs. He takes Tylenol for this. He does have a history of prostate cancer that has been followed by Dr. Risa Grill  with [facility]. He does note slightly more urinary stream hesitancy though no straining or reported urinary retention. Discharge summary not fully completed though briefly reviewed with completed portions reviewed. Medications reviewed.  PMH: Former smoker   ROS see history of present illness  Objective  Physical Exam Vitals:   03/24/17 1129 03/24/17 1156  BP: (!) 150/86 136/82  Pulse: 84   Temp: 98.1 F (36.7 C)   SpO2: 98%     BP Readings from Last 3 Encounters:  03/24/17 136/82  03/17/17 125/78  03/12/17 102/64   Wt Readings from Last 3 Encounters:  03/24/17 168 lb 9.6 oz (76.5 kg)  03/17/17 166 lb 7.2 oz (75.5 kg)  03/12/17 166 lb 3.2 oz (75.4 kg)    Physical Exam  Constitutional: No distress.  Cardiovascular: Normal rate, regular rhythm and normal heart sounds.   Pulmonary/Chest: Effort normal and breath sounds normal.  Musculoskeletal: He exhibits no edema.  No midline spine tenderness, no  midline spine step-off, no muscular back tenderness  Neurological: He is alert. Gait normal.  5/5 strength bilateral quads, hamstrings, plantar flexion, and dorsiflexion, sensation light touch intact bilaterally lower extremities  Skin: Skin is warm and dry. He is not diaphoretic.     Assessment/Plan: Please see individual problem list.  Atrial fibrillation with [facility]) Patient has done well since discharge. He has maintained sinus rhythm. Asymptomatic. He'll see cardiology next week. We'll attempt to get him established here in [place] with Dr. Rockey Situ. Given return precautions.  History of prostate cancer Followed by urology. Does have some back pain. We'll check a PSA and will need to consider imaging of his back depending on PSA results.  Physical deconditioning Has not been set up with home health PT or OT yet. Orders placed.  Low back pain Suspect muscular issue though does have history of prostate cancer. We'll check a PSA and determine need for an imaging based off of that. He'll do physical therapy through home health PT. Given return precautions.  Acute cystitis without hematuria Patient has finished treatment for UTI. He is asymptomatic. We'll check a UA.   Orders Placed This Encounter  Procedures  . Urine Culture  . Basic Metabolic Panel (BMET)  . PSA, Medicare  . Urine Microscopic Only  . POCT Urinalysis Dipstick   Carl Rumps, MD [place]

## 2017-03-24 NOTE — Assessment & Plan Note (Signed)
Suspect muscular issue though does have history of prostate cancer. We'll check a PSA and determine need for an imaging based off of that. He'll do physical therapy through home health PT. Given return precautions.

## 2017-03-24 NOTE — Assessment & Plan Note (Signed)
Patient has done well since discharge. He has maintained sinus rhythm. Asymptomatic. He'll see cardiology next week. We'll attempt to get him established here in [place] with Dr. Rockey Situ. Given return precautions.

## 2017-03-24 NOTE — Assessment & Plan Note (Signed)
Has not been set up with home health PT or OT yet. Orders placed.

## 2017-03-25 ENCOUNTER — Other Ambulatory Visit: Payer: Self-pay | Admitting: Family Medicine

## 2017-03-25 DIAGNOSIS — M545 Low back pain, unspecified: Secondary | ICD-10-CM

## 2017-03-25 DIAGNOSIS — G8929 Other chronic pain: Secondary | ICD-10-CM

## 2017-03-25 LAB — URINALYSIS, MICROSCOPIC ONLY
Bacteria, UA: NONE SEEN [HPF]
Casts: NONE SEEN [LPF]
Crystals: NONE SEEN [HPF]
Squamous Epithelial / LPF: NONE SEEN [HPF] (ref <=5)
WBC, UA: >60 WBC/HPF — AB (ref <=5)
Yeast: NONE SEEN [HPF]

## 2017-03-25 NOTE — Addendum Note (Signed)
Addended by: [name] on: 03/25/2017 06:50 PM   Modules accepted: Orders

## 2017-03-26 LAB — URINE CULTURE: Organism ID, Bacteria: NO GROWTH

## 2017-03-27 ENCOUNTER — Ambulatory Visit (INDEPENDENT_AMBULATORY_CARE_PROVIDER_SITE_OTHER): Payer: Medicare Other

## 2017-03-27 DIAGNOSIS — G8929 Other chronic pain: Secondary | ICD-10-CM | POA: Diagnosis not present

## 2017-03-27 DIAGNOSIS — M545 Low back pain, unspecified: Secondary | ICD-10-CM

## 2017-03-31 ENCOUNTER — Encounter: Payer: Self-pay | Admitting: Physician Assistant

## 2017-03-31 NOTE — Progress Notes (Signed)
Cardiology Office Note    Date:  04/01/2017  ID:  Carl [name], DOB 06-18-1936, MRN [id number] PCP:  [name], MD  Cardiologist:  Formerly Dr. Aundra Dubin and Dr. Doreatha Lew; previously requested Dr. Marlou Porch and was followed in hospital by him.    Chief Complaint: f/u atrial fib  History of Present Illness:  Carl [name] is a 81 y.o. male with history of CAD s/p CABG 2006 with  LIMA-LAD, SVG-Ramus, SVG-OM, seq SVG-PLV-PDA), PAF on Eliquis s/p DCCV in April 2018, HTN, HLD, CKD III, orthostatic hypotension, anemia, lung nodule, moderate MR, elevated PASP, recently diagnosed atrial flutter 02/2017 who presents for post-hospital follow-up.  Last echo 08/2016 showed EF 50-55%, moderate LVH, moderate MR, severe LAE, mod RV dilation, PASP 66mmHg. He's been followed closely this year due to PAF s/p DCCV in 10/2016, with subsequent med adjustment due to persistent orthostatic hypotension. He required discontinuation of beta blocker and flomax as well as initation of midodrine. In 02/2017 he was seen by his PCP and found to be in new onset atrial flutter, also diagnosed with a UTI. During that admission, workup showed hyponatremia 132, acute kidney injury with Cr 1.95 (previously 1.19), anemia, and newly noted 7 mm mid left lung nodule most suggestive of granuloma but not seen on prior images. He was started on IV amiodarone to assist with rate control and converted spontaneously to NSR with PACs. He had missed 2 doses of Eliquis previously. Troponin levels were equivocal to peak of 0.03. He was transitioned to oral amiodarone (400mg  PO BID for 5 days then 200mg  BID for 7 days then 200mg  PO QD thereafter). Most recent labs were notable for normal TSH, Hgb 10.0 (chronic appearing), Cr 1.15, albumin 2.4, normal LFTs otherwise.  He presents back to clinic for follow-up. Overall he says he's been feeling well since discharge. No CP, SOB, LEE, orthopnea, palpitations or bleeding problems. He is now doing  amiodarone 200mg  daily. Denies prior history of lung disease. He is wondering if he can switch to [place] office due to convenience; son also sees Dr. Rockey Situ.   Past Medical History:  Diagnosis Date  . Anemia   . Arthritis    arms  . Bladder calculus   . Chronic anxiety   . CKD (chronic kidney disease), stage III   . Coronary artery disease CARDIOLOGIST-  DR Loralie Champagne   a. CABG 2006 with  LIMA-LAD, SVG-Ramus, SVG-OM, seq SVG-PLV-PDA.  [name] GERD (gastroesophageal reflux disease)   . History of prostate cancer urologist-  dr Risa Grill   hx radioactive prostate seed implants 1997--  currently on Lupron injection  . Hyperlipidemia   . Hyperplasia of prostate without lower urinary tract symptoms (LUTS)   . Hypertension   . Ischemic cardiomyopathy    echo (01/13) 45-50% septal akinesis, mild MR/  echo  (01/16) ef 60-65%, mid-apical anteroseptal hypokinesis  . Lung nodule   . Moderate mitral regurgitation   . Orthostatic hypotension   . PAC (premature atrial contraction)   . PAF (paroxysmal atrial fibrillation) ([place])    a. prior DCCV 10/2016.  . Paroxysmal atrial flutter ([place])    a. dx 02/2017, started on amiodarone.  . S/P CABG x 5 04/04/2005   LIMA to LAD,  SVG to Ramus,  SVG to OM,  seqSVG to PLV and PDA  . Wears glasses     Past Surgical History:  Procedure Laterality Date  . CARDIAC CATHETERIZATION  04/02/2005   dr Doreatha Lew    Critical three-vessel coronary  disease --  Essentially normal left ventricular function , ef 55%  . CARDIOVASCULAR STRESS TEST  05-01-2008  dr Doreatha Lew /  dr harding   Low risk nuclear study w/ no ischemia or scar but flattened septal motion/  ef 64%  . CARDIOVERSION N/A 11/19/2016   Procedure: CARDIOVERSION;  Surgeon: Jerline Pain, MD;  Location: [facility];  Service: Cardiovascular;  Laterality: N/A;  . COLONOSCOPY N/A 10/23/2016   Procedure: COLONOSCOPY;  Surgeon: Milus Banister, MD;  Location: [facility];  Service: Endoscopy;  Laterality: N/A;  .  CORONARY ARTERY BYPASS GRAFT  04/04/2005   dr  Ricard Dillon   LIMA - LAD,  SVG - Ramus, SVG - OM,  seqSVG - PLV and PDA  . CYSTOSCOPY WITH LITHOLAPAXY N/A 05/07/2016   Procedure: CYSTOSCOPY WITH LITHOLAPAXY;  Surgeon: Rana Snare, MD;  [facility];  Service: Urology;  Laterality: N/A;  . HOLMIUM LASER APPLICATION N/A 14/97/0263   Procedure: HOLMIUM LASER APPLICATION;  Surgeon: Rana Snare, MD;  Location[facility];  Service: Urology;  Laterality: N/A;  . INGUINAL HERNIA REPAIR Left 03-30-2005  dr Excell Seltzer   incarcerated   . RADIOACTIVE PROSTATE SEED IMPLANTS  1997  . TRANSTHORACIC ECHOCARDIOGRAM  08/24/2014   mid anteroseptal and distal septak hypokinetic,  mild focal basal LVH, ef 60-65%,  grade 1 diastolic dysfunction/  mild MR/  mild LAE/  mild dilated RV with normal RVSP/  trivial TR    Current Medications: Current Meds  Medication Sig  . acetaminophen (TYLENOL) 500 MG tablet Take 500 mg by mouth every 6 (six) hours as needed (for pain).   [name] apixaban (ELIQUIS) 5 MG TABS tablet Take 1 tablet (5 mg total) by mouth 2 (two) times daily.  . bicalutamide (CASODEX) 50 MG tablet Take 50 mg by mouth daily with breakfast.   . omeprazole (PRILOSEC) 20 MG capsule Take 20 mg by mouth daily.  . rosuvastatin (CRESTOR) 40 MG tablet Take 40 mg by mouth daily.  . [DISCONTINUED] amiodarone (PACERONE) 200 MG tablet Per cardiology Amiodarone;  400mg  PO twice a day for 5 days;  then 200mg  Twice a day for 7 days then 200mg   Once per day thereafter  . [DISCONTINUED] omeprazole (PRILOSEC) 20 MG capsule TAKE ONE CAPSULE BY MOUTH EVERY DAY (Patient taking differently: Take 20 mg by moiuth once a day)  . [DISCONTINUED] rosuvastatin (CRESTOR) 40 MG tablet TAKE 1 TABLET BY MOUTH EVERY DAY (Patient taking differently: Take 40 mg by mouth once a day)     Allergies:   Patient has no known allergies.   Social History   Social History  . Marital status: Widowed    Spouse  name: N/A  . Number of children: 3  . Years of education: N/A   Occupational History  . retired    Social History Main Topics  . Smoking status: Former Smoker    Years: 2.00    Types: Cigarettes    Quit date: 07/28/1960  . Smokeless tobacco: Never Used  . Alcohol use No  . Drug use: No  . Sexual activity: No   Other Topics Concern  . None   Social History Narrative  . None     Family History:  Family History  Problem Relation Age of Onset  . Heart disease Mother   . Cancer Mother   . Cancer Father   . Cancer Brother    ROS:   Please see the history of present illness. All other systems are reviewed  and otherwise negative.    PHYSICAL EXAM:   VS:  BP (!) 142/85   Pulse 83   Ht 5\' 11"  (1.803 m)   Wt 166 lb (75.3 kg)   BMI 23.15 kg/m   BMI: Body mass index is 23.15 kg/m. GEN: Well nourished, well developed elderly WM, in no acute distress  HEENT: normocephalic, atraumatic Neck: no JVD, carotid bruits, or masses Cardiac: RRR; no murmurs, rubs, or gallops, no edema  Respiratory:  clear to auscultation bilaterally, normal work of breathing GI: soft, nontender, nondistended, + BS MS: no deformity or atrophy  Skin: warm and dry, no rash Neuro:  Alert and Oriented x 3, Strength and sensation are intact, follows commands Psych: euthymic mood, full affect  Wt Readings from Last 3 Encounters:  04/01/17 166 lb (75.3 kg)  03/24/17 168 lb 9.6 oz (76.5 kg)  03/17/17 166 lb 7.2 oz (75.5 kg)      Studies/Labs Reviewed:   EKG:  EKG was ordered today and personally reviewed by me and demonstrates NSR 83bpm 1st degree AV block, IRBBB, cnanot rule out prior ant infarct, no acute changes.  Recent Labs: 03/12/2017: TSH 1.445 03/14/2017: Magnesium 1.8 03/16/2017: ALT 31; Hemoglobin 10.0; Platelets 170 03/24/2017: BUN 14; Creatinine, Ser 1.15; Potassium 3.6; Sodium 141   Lipid Panel    Component Value Date/Time   CHOL 83 03/13/2017 0451   TRIG 79 03/13/2017 0451   HDL 27  (L) 03/13/2017 0451   CHOLHDL 3.1 03/13/2017 0451   VLDL 16 03/13/2017 0451   LDLCALC 40 03/13/2017 0451    Additional studies/ records that were reviewed today include: Summarized above    ASSESSMENT & PLAN:   1. Paroxysmal atrial flutter/on amiodarone therapy - maintaining NSR on amiodarone 200mg  daily. Recent baseline LFTs/TSH wnl. Discussed yearly eye exam with patient as well as need for monitoring of liver/thyroid/lungs periodically. Will arrange baseline PFTs with [facility]. Further monitoring of organ systems while on amiodarone will be at discretion of primary cardiologist. Continue Eliquis. He does report some mild weight loss over the last few years to which I've asked him to continue to follow PCP. For now, weight, age and Cr are appropriate for current dose of Eliquis. 2. Paroxysmal atrial fib - maintaining NSR as above. 3. Orthostatic hypotension - asymptomatic. BP mildly elevated in clinic but given his prior history of this, I would not chase aggressively. 4. CAD s/p CABG - no recent anginal sx. Follow. Has not been on aspirin due to concomitant Eliquis and stable CV disease. 5. Mitral valve regurgitation - no progressive murmur on exam. Follow clinically and consider f/u surveillance echo early next year at discretion of cardiologist.  Disposition: Patient requests switch to [facility] office due to convenience of closer to home; son also sees Dr. Rockey Situ. Sent message to Dr. Ardelia Mems requesting permission per clinic protocol. If OK, would recommend f/u 2-3 months in [place] office.   Medication Adjustments/Labs and Tests Ordered: Current medicines are reviewed at length with the patient today.  Concerns regarding medicines are outlined above. Medication changes, Labs and Tests ordered today are summarized above and listed in the Patient Instructions accessible in Encounters.   Signed, Charlie Pitter, PA-C  04/01/2017 11:00 AM    [facility] [place], [place], [place]  [address] Phone: [phone]; Fax: [phone]

## 2017-04-01 ENCOUNTER — Encounter (INDEPENDENT_AMBULATORY_CARE_PROVIDER_SITE_OTHER): Payer: Self-pay

## 2017-04-01 ENCOUNTER — Encounter: Payer: Self-pay | Admitting: Physician Assistant

## 2017-04-01 ENCOUNTER — Ambulatory Visit (INDEPENDENT_AMBULATORY_CARE_PROVIDER_SITE_OTHER): Payer: Medicare Other | Admitting: Physician Assistant

## 2017-04-01 ENCOUNTER — Other Ambulatory Visit: Payer: Self-pay | Admitting: *Deleted

## 2017-04-01 VITALS — BP 142/85 | HR 83 | Ht 71.0 in | Wt 166.0 lb

## 2017-04-01 DIAGNOSIS — I340 Nonrheumatic mitral (valve) insufficiency: Secondary | ICD-10-CM

## 2017-04-01 DIAGNOSIS — I951 Orthostatic hypotension: Secondary | ICD-10-CM

## 2017-04-01 DIAGNOSIS — I480 Paroxysmal atrial fibrillation: Secondary | ICD-10-CM

## 2017-04-01 DIAGNOSIS — I2510 Atherosclerotic heart disease of native coronary artery without angina pectoris: Secondary | ICD-10-CM

## 2017-04-01 DIAGNOSIS — Z79899 Other long term (current) drug therapy: Secondary | ICD-10-CM | POA: Diagnosis not present

## 2017-04-01 DIAGNOSIS — I4892 Unspecified atrial flutter: Secondary | ICD-10-CM

## 2017-04-01 MED ORDER — AMIODARONE HCL 200 MG PO TABS: 200.0000 mg | ORAL_TABLET | Freq: Every day | ORAL | 11 refills | Status: DC

## 2017-04-01 MED ORDER — APIXABAN 5 MG PO TABS: 5.0000 mg | ORAL_TABLET | Freq: Two times a day (BID) | ORAL | 11 refills | Status: DC

## 2017-04-01 NOTE — Telephone Encounter (Signed)
Pt last saw Melina Copa, PA on 04/01/17, last labs 03/24/17 Creat 1.15, age 81, weight 75.3kg, based on specified criteria pt is on appropriate dosage of Eliquis 5mg  BID.  Will refill rx.

## 2017-04-01 NOTE — Patient Instructions (Signed)
Medication Instructions:  Your physician recommends that you continue on your current medications as directed. Please refer to the Current Medication list given to you today.  Labwork: None ordered  Testing/Procedures: Your physician has recommended that you have a pulmonary function test. Pulmonary Function Tests are a group of tests that measure how well air moves in and out of your lungs.    Follow-Up: Your physician recommends that you schedule a follow-up appointment in: 2-3 MONTHS    Any Other Special Instructions Will Be Listed Below (If Applicable).  Pulmonary Function Tests Pulmonary function tests (PFTs) are used to measure how well your lungs work, find out what is causing your lung problems, and figure out the best treatment for you. You may have PFTs:  When you have an illness involving the lungs.  To follow changes in your lung function over time if you have a chronic lung disease.  If you are an Nature conservation officer. This checks the effects of being exposed to chemicals over a long period of time.  To check lung function before having surgery or other procedures.  To check your lungs if you smoke.  To check if prescribed medicines or treatments are helping your lungs.  Your results will be compared to the expected lung function of someone with healthy lungs who is similar to you in:  Age.  Gender.  Height.  Weight.  Race or ethnicity.  This is done to show how your lungs compare to normal lung function (percent predicted). This is how your health care provider knows if your lung function is normal or not. If you have had PFTs done before, your health care provider will compare your current results with past results. This shows if your lung function is better, worse, or the same as before. Tell a health care provider about:  Any allergies you have.  All medicines you are taking, including inhaler or nebulizer medicines, vitamins, herbs, eye drops,  creams, and over-the-counter medicines.  Any blood disorders you have.  Any surgeries you have had, especially recent eye surgery, abdominal surgery, or chest surgery. These can make PFTs difficult or unsafe.  Any medical conditions you have, including chest pain or heart problems, tuberculosis, or respiratory infections such as pneumonia, a cold, or the flu.  Any fear of being in closed spaces (claustrophobia). Some of your tests may be in a closed space. What are the risks? Generally, this is a safe procedure. However, problems may occur, including:  Light-headedness due to over-breathing (hyperventilation).  An asthma attack from deep breathing.  A collapsed lung.  What happens before the procedure?  Take over-the-counter and prescription medicines only as told by your health care provider. If you take inhaler or nebulizer medicines, ask your health care provider which medicines you should take on the day of your testing. Some inhaler medicines may interfere with PFTs if they are taken shortly before the tests.  Follow your health care provider's instructions on eating and drinking restrictions. This may include avoiding eating large meals and drinking alcohol before the testing.  Do not use any products that contain nicotine or tobacco, such as cigarettes and e-cigarettes. If you need help quitting, ask your health care provider.  Wear comfortable clothing that will not interfere with breathing. What happens during the procedure?  You will be given a soft nose clip to wear. This is done so all of your breaths will go through your mouth instead of your nose.  You will be given a  germ-free (sterile) mouthpiece. It will be attached to a machine that measures your breathing (spirometer).  You will be asked to do various breathing maneuvers. The maneuvers will be done by breathing in (inhaling) and breathing out (exhaling). You may be asked to repeat the maneuvers several times before  the testing is done.  It is important to follow the instructions exactly to get accurate results. Make sure to blow as hard and as fast as you can when you are told to do so.  You may be given a medicine that makes the small air passages in your lungs larger (bronchodilator) after testing has been done. This medicine will make it easier for you to breathe.  The tests will be repeated after the bronchodilator has taken effect.  You will be monitored carefully during the procedure for faintness, dizziness, trouble breathing, or any other problems. The procedure may vary among health care providers and hospitals. What happens after the procedure?  It is up to you to get your test results. Ask your health care provider, or the department that is doing the tests, when your results will be ready. After you have received your test results, talk with your health care provider about treatment options, if necessary. Summary  Pulmonary function tests (PFTs) are used to measure how well your lungs work, find out what is causing your lung problems, and figure out the best treatment for you.  Wear comfortable clothing that will not interfere with breathing.  It is up to you to get your test results. After you have received them, talk with your health care provider about treatment options, if necessary. This information is not intended to replace advice given to you by your health care provider. Make sure you discuss any questions you have with your health care provider. Document Released: 03/06/2004 Document Revised: 06/05/2016 Document Reviewed: 06/05/2016 Elsevier Interactive Patient Education  2017 [employer].     If you need a refill on your cardiac medications before your next appointment, please call your pharmacy.

## 2017-04-06 ENCOUNTER — Ambulatory Visit (INDEPENDENT_AMBULATORY_CARE_PROVIDER_SITE_OTHER): Payer: Medicare Other | Admitting: Internal Medicine

## 2017-04-06 ENCOUNTER — Telehealth: Payer: Self-pay | Admitting: *Deleted

## 2017-04-06 ENCOUNTER — Telehealth: Payer: Self-pay | Admitting: Physician Assistant

## 2017-04-06 DIAGNOSIS — Z79899 Other long term (current) drug therapy: Secondary | ICD-10-CM

## 2017-04-06 LAB — PULMONARY FUNCTION TEST
FEF 25-75 Pre: 3.93 L/sec
FEF2575-%Pred-Pre: 187 %
FEV1-%Pred-Pre: 113 %
FEV1-Pre: 3.44 L
FEV1FVC-%Pred-Pre: 119 %
FEV6-%Pred-Pre: 101 %
FEV6-Pre: 3.99 L
FEV6FVC-%Pred-Pre: 106 %
FVC-%Pred-Pre: 94 %
FVC-Pre: 4 L
Pre FEV1/FVC ratio: 86 %
Pre FEV6/FVC Ratio: 100 %

## 2017-04-06 NOTE — Telephone Encounter (Signed)
Carl [name] with [facility] requested verbal orders for physical therapy with a frequency of 1 time a week for 2 weeks and 1 time a week for 3 weeks . Contact [phone]

## 2017-04-06 NOTE — Progress Notes (Signed)
PFT was attempted today. Pt could not complete the test due to being unable to follow directions and perform the proper technique. Alethia Berthold, RN also came into the PFT lab to attempt the test with the pt, she was also unsuccessful.

## 2017-04-06 NOTE — Telephone Encounter (Signed)
Verbal orders can be given. 

## 2017-04-06 NOTE — Telephone Encounter (Signed)
Please advise 

## 2017-04-06 NOTE — Telephone Encounter (Signed)
Orders given.  

## 2017-04-06 NOTE — Telephone Encounter (Signed)
I reached out to Drs. Gollan and Skains to make sure they are OK with patient switching (per clinic protocol). Both have approved. Please arrange f/u with Dr. Rockey Situ as outlined in note and let pt know. Thx! Kimberley Dastrup PA-C

## 2017-04-07 ENCOUNTER — Telehealth: Payer: Self-pay | Admitting: Family Medicine

## 2017-04-07 ENCOUNTER — Other Ambulatory Visit: Payer: Self-pay | Admitting: Family Medicine

## 2017-04-07 DIAGNOSIS — M545 Low back pain, unspecified: Secondary | ICD-10-CM

## 2017-04-07 DIAGNOSIS — G8929 Other chronic pain: Secondary | ICD-10-CM

## 2017-04-07 NOTE — Telephone Encounter (Signed)
Pt will change providers to Dr. Rockey Situ Per 9/5 notes, pt will need to follow up in [place] in 2-3 months. Routed to scheduling for an appt.

## 2017-04-07 NOTE — Telephone Encounter (Signed)
Please advise 

## 2017-04-07 NOTE — Telephone Encounter (Signed)
Caprice Red from [place] OT called is needing verbal orders for OT 1x2 for exercise, energy conservation, and ok to discharge when goal has been met. Please advise, thank you!  Call [facility] @ [phone]

## 2017-04-07 NOTE — Telephone Encounter (Signed)
Verbal orders can be given. 

## 2017-04-07 NOTE — Telephone Encounter (Signed)
Left message with verbal orders 

## 2017-04-10 ENCOUNTER — Encounter: Payer: Self-pay | Admitting: Gastroenterology

## 2017-04-15 ENCOUNTER — Ambulatory Visit: Payer: Medicare Other

## 2017-04-16 ENCOUNTER — Ambulatory Visit
Admission: RE | Admit: 2017-04-16 | Discharge: 2017-04-16 | Disposition: A | Discharge status: Home / Self Care | Payer: Medicare Other | Source: Ambulatory Visit | Attending: Family Medicine | Admitting: Family Medicine

## 2017-04-16 DIAGNOSIS — G8929 Other chronic pain: Secondary | ICD-10-CM | POA: Insufficient documentation

## 2017-04-16 DIAGNOSIS — M5126 Other intervertebral disc displacement, lumbar region: Secondary | ICD-10-CM | POA: Diagnosis not present

## 2017-04-16 DIAGNOSIS — M545 Low back pain, unspecified: Secondary | ICD-10-CM

## 2017-04-17 ENCOUNTER — Telehealth: Payer: Self-pay | Admitting: Family Medicine

## 2017-04-17 NOTE — Telephone Encounter (Signed)
Patients right hand/wrist is swollen, red, and warm to touch. Symptoms started yesterday. [facility] advised caregiver to take patient to urgent care. Spoke with caregiver and she stated they were getting ready to go to urgent care.

## 2017-04-17 NOTE — Telephone Encounter (Signed)
Noted and agree with urgent care evaluation.

## 2017-04-17 NOTE — Telephone Encounter (Signed)
Patient Name: Carl [name] DOB: Jun 29, 1936 Initial Comment Caller states patient's right hand and wrist is swelling, red and hot to the touch. Nurse Assessment Nurse: Emilio Math, RN, Estill Bamberg Date/Time (Eastern Time): 04/17/2017 9:36:30 AM Confirm and document reason for call. If symptomatic, describe symptoms. ---Caller is caregiver, states R wrist and hand are swollen. Also red and warm to touch. Denies fever. Does the patient have any new or worsening symptoms? ---Yes Will a triage be completed? ---Yes Related visit to physician within the last 2 weeks? ---Yes Does the PT have any chronic conditions? (i.e. diabetes, asthma, etc.) ---Yes List chronic conditions. ---A-fib Is this a behavioral health or substance abuse call? ---No Guidelines Guideline Title Affirmed Question Affirmed Notes Hand and Wrist Pain [1] SEVERE pain (e.g., excruciating, unable to use hand at all) AND [2] not improved after 2 hours of pain medicine Final Disposition User See Physician within 4 Hours (or PCP triage) Emilio Math, RN, Estill Bamberg Comments No appointment available in office today. Caregiver states she will take patient to UC. Referrals GO TO FACILITY OTHER - SPECIFY Caller Disagree/Comply Comply Caller Understands Yes PreDisposition Call Doctor

## 2017-04-17 NOTE — Telephone Encounter (Signed)
FYI

## 2017-04-17 NOTE — Telephone Encounter (Signed)
Pt caretaker [name] called and stated that pt rt hand and wrist are swollen. Also stated that it is red and hot to the touch. Sent call to team health triage. Please advise, thank you!  Call [facility] @ [phone]

## 2017-05-18 NOTE — Progress Notes (Signed)
Cardiology Office Note  Date:  05/22/2017   ID:  Carl [name], DOB December 28, 1935, MRN [id number]  PCP:  [name], MD   Chief Complaint  Patient presents with  . other    Pt. would like to transfer his care to [facility] from the [facility] office. Meds reviewed by the pt. verbally. "doing well."     HPI:  Carl [name] is a 81 y.o. male with history of  CAD s/p CABG 2006 with  LIMA-LAD, SVG-Ramus, SVG-OM, seq SVG-PLV-PDA),  PAF on Eliquis s/p DCCV in April 2018,  HTN,  HLD,  CKD III,  orthostatic hypotension,  Anemia,  lung nodule,  moderate MR, elevated PASP,  recently diagnosed atrial flutter 02/2017  Who presents to establish care in the [facility] office and for follow-up of his paroxysmal atrial fibrillation, coronary disease  Last echo 08/2016 showed EF 50-55%, moderate LVH, moderate MR, severe LAE, mod RV dilation, PASP 62mmHg.   In follow-up today he reports that he feels well, Presents with family that live with him No regular exercise, moderate leg weakness Gait instability, no falls  Denies any tachycardia concerning for atrial fibrillation  Lab work reviewed with him showing hematocrit 30.9 Slow trend downward  Further discussion, reports having colonoscopy with numerous polyps, decision made for no repeat colonoscopy per the family   EKG personally reviewed by myself on todays visit Shows normal sinus rhythm first-degree AV block QTC 483, no significant ST or T wave changes  Of the past medical history reviewed PAF s/p DCCV in 10/2016,  Prior history of orthostatic hypotension.    02/2017  onset atrial flutter, also diagnosed with a UTI.  During that admission, workup showed hyponatremia 132, acute kidney injury with Cr 1.95 (previously 1.19), anemia, and newly noted 7 mm mid left lung nodule most suggestive of granuloma but not seen on prior images.   started on IV amiodarone to assist with rate control and converted spontaneously to NSR with  PACs.    PMH:   has a past medical history of Anemia; Arthritis; Bladder calculus; Chronic anxiety; CKD (chronic kidney disease), stage III ([place]); Coronary artery disease (CARDIOLOGIST-  DR Loralie Champagne); GERD (gastroesophageal reflux disease); History of prostate cancer (urologist-  dr Risa Grill); Hyperlipidemia; Hyperplasia of prostate without lower urinary tract symptoms (LUTS); Hypertension; Ischemic cardiomyopathy; Lung nodule; Moderate mitral regurgitation; Orthostatic hypotension; PAC (premature atrial contraction); PAF (paroxysmal atrial fibrillation) ([place]); Paroxysmal atrial flutter (HCC); S/P CABG x 5 (04/04/2005); and Wears glasses.  PSH:    Past Surgical History:  Procedure Laterality Date  . CARDIAC CATHETERIZATION  04/02/2005   dr Doreatha Lew    Critical three-vessel coronary disease --  Essentially normal left ventricular function , ef 55%  . CARDIOVASCULAR STRESS TEST  05-01-2008  dr Doreatha Lew /  dr harding   Low risk nuclear study w/ no ischemia or scar but flattened septal motion/  ef 64%  . CARDIOVERSION N/A 11/19/2016   Procedure: CARDIOVERSION;  Surgeon: Jerline Pain, MD;  Location: [place];  Service: Cardiovascular;  Laterality: N/A;  . COLONOSCOPY N/A 10/23/2016   Procedure: COLONOSCOPY;  Surgeon: Milus Banister, MD;  Location: [facility];  Service: Endoscopy;  Laterality: N/A;  . CORONARY ARTERY BYPASS GRAFT  04/04/2005   dr  Ricard Dillon   LIMA - LAD,  SVG - Ramus, SVG - OM,  seqSVG - PLV and PDA  . CYSTOSCOPY WITH LITHOLAPAXY N/A 05/07/2016   Procedure: CYSTOSCOPY WITH LITHOLAPAXY;  Surgeon: Rana Snare, MD;  Location: [facility];  Service: Urology;  Laterality: N/A;  . HOLMIUM LASER APPLICATION N/A 93/26/7124   Procedure: HOLMIUM LASER APPLICATION;  Surgeon: Rana Snare, MD;  Location: [facility];  Service: Urology;  Laterality: N/A;  . INGUINAL HERNIA REPAIR Left 03-30-2005  dr Excell Seltzer   incarcerated   . RADIOACTIVE PROSTATE SEED  IMPLANTS  1997  . TRANSTHORACIC ECHOCARDIOGRAM  08/24/2014   mid anteroseptal and distal septak hypokinetic,  mild focal basal LVH, ef 60-65%,  grade 1 diastolic dysfunction/  mild MR/  mild LAE/  mild dilated RV with normal RVSP/  trivial TR    Current Outpatient Prescriptions  Medication Sig Dispense Refill  . acetaminophen (TYLENOL) 500 MG tablet Take 500 mg by mouth every 6 (six) hours as needed (for pain).     [name] amiodarone (PACERONE) 200 MG tablet Take 1 tablet (200 mg total) by mouth daily. 30 tablet 11  . apixaban (ELIQUIS) 5 MG TABS tablet Take 1 tablet (5 mg total) by mouth 2 (two) times daily. 60 tablet 11  . bicalutamide (CASODEX) 50 MG tablet Take 50 mg by mouth daily with breakfast.   5  . omeprazole (PRILOSEC) 20 MG capsule Take 20 mg by mouth daily.    . rosuvastatin (CRESTOR) 40 MG tablet Take 40 mg by mouth daily.     No current facility-administered medications for this visit.      Allergies:   Patient has no known allergies.   Social History:  The patient  reports that he quit smoking about 56 years ago. His smoking use included Cigarettes. He quit after 2.00 years of use. He has never used smokeless tobacco. He reports that he does not drink alcohol or use drugs.   Family History:   family history includes Cancer in his brother, father, and mother; Heart disease in his mother.    Review of Systems: Review of Systems  Constitutional: Negative.   Respiratory: Negative.   Cardiovascular: Negative.   Gastrointestinal: Negative.   Musculoskeletal: Negative.   Neurological: Negative.   Psychiatric/Behavioral: Negative.   All other systems reviewed and are negative.   PHYSICAL EXAM: VS:  BP 120/76 (BP Location: Left Arm, Patient Position: Sitting, Cuff Size: Normal)   Pulse 82   Ht 5\' 11"  (1.803 m)   Wt 171 lb 8 oz (77.8 kg)   BMI 23.92 kg/m  , BMI Body mass index is 23.92 kg/m. GEN: Well nourished, well developed, in no acute distress  HEENT: normal  Neck:  no JVD, carotid bruits, or masses Cardiac: RRR; no murmurs, rubs, or gallops,no edema  Respiratory:  clear to auscultation bilaterally, normal work of breathing GI: soft, nontender, nondistended, + BS MS: no deformity or atrophy  Skin: warm and dry, no rash Neuro:  Strength and sensation are intact Psych: euthymic mood, full affect   Recent Labs: 03/12/2017: TSH 1.445 03/14/2017: Magnesium 1.8 03/16/2017: ALT 31; Hemoglobin 10.0; Platelets 170 03/24/2017: BUN 14; Creatinine, Ser 1.15; Potassium 3.6; Sodium 141    Lipid Panel Lab Results  Component Value Date   CHOL 83 03/13/2017   HDL 27 (L) 03/13/2017   LDLCALC 40 03/13/2017   TRIG 79 03/13/2017      Wt Readings from Last 3 Encounters:  05/22/17 171 lb 8 oz (77.8 kg)  04/01/17 166 lb (75.3 kg)  03/24/17 168 lb 9.6 oz (76.5 kg)      ASSESSMENT AND PLAN:  Persistent atrial fibrillation (HCC) Maintaining normal sinus rhythm Recommended to continue low-dose amiodarone Other rate controlling  agents such as beta blockers previously held for orthostasis On anticoagulation, eliquis  Acute diastolic congestive heart failure ([place]) Appears relatively euvolemic  Previous medications held for hypotension  Hx of CABG x 5 '06 Recommended to continue on his Crestor LDL at goal  CKD (chronic kidney disease) stage 2, GFR 60-89 ml/min Renal function improved, creatinine 1.15  Anemia Chronic baseline anemia, recommended he add iron supplement If numbers continue to trend lower, may need to readdress his anticoagulation  Disposition:   F/U  6 months   Total encounter time more than 25 minutes  Greater than 50% was spent in counseling and coordination of care with the patient    Orders Placed This Encounter  Procedures  . EKG 12-Lead     Signed, Esmond Plants, M.D., Ph.D. 05/22/2017  [place], [place]

## 2017-05-20 ENCOUNTER — Ambulatory Visit: Payer: Medicare Other | Admitting: Cardiology

## 2017-05-22 ENCOUNTER — Ambulatory Visit (INDEPENDENT_AMBULATORY_CARE_PROVIDER_SITE_OTHER): Payer: Medicare Other | Admitting: Cardiovascular Disease

## 2017-05-22 ENCOUNTER — Encounter: Payer: Self-pay | Admitting: Cardiovascular Disease

## 2017-05-22 VITALS — BP 120/76 | HR 82 | Ht 71.0 in | Wt 171.5 lb

## 2017-05-22 DIAGNOSIS — N182 Chronic kidney disease, stage 2 (mild): Secondary | ICD-10-CM | POA: Diagnosis not present

## 2017-05-22 DIAGNOSIS — I5031 Acute diastolic (congestive) heart failure: Secondary | ICD-10-CM

## 2017-05-22 DIAGNOSIS — I481 Persistent atrial fibrillation: Secondary | ICD-10-CM

## 2017-05-22 DIAGNOSIS — I4819 Other persistent atrial fibrillation: Secondary | ICD-10-CM

## 2017-05-22 DIAGNOSIS — Z951 Presence of aortocoronary bypass graft: Secondary | ICD-10-CM | POA: Diagnosis not present

## 2017-05-22 DIAGNOSIS — I10 Essential (primary) hypertension: Secondary | ICD-10-CM

## 2017-05-22 DIAGNOSIS — I4891 Unspecified atrial fibrillation: Secondary | ICD-10-CM | POA: Diagnosis not present

## 2017-05-22 NOTE — Patient Instructions (Signed)
Medication Instructions:   No medication changes made  Consider taking iron pill 1 to 2 a day  Labwork:  No new labs needed  Testing/Procedures:  No further testing at this time   Follow-Up: It was a pleasure seeing you in the office today. Please call us if you have new issues that need to be addressed before your next appt.  [phone]  Your physician wants you to follow-up in: 6 months.  You will receive a reminder letter in the mail two months in advance. If you don't receive a letter, please call our office to schedule the follow-up appointment.  If you need a refill on your cardiac medications before your next appointment, please call your pharmacy.

## 2017-05-25 ENCOUNTER — Encounter: Payer: Self-pay | Admitting: Family Medicine

## 2017-05-25 ENCOUNTER — Ambulatory Visit (INDEPENDENT_AMBULATORY_CARE_PROVIDER_SITE_OTHER): Payer: Medicare Other | Admitting: Family Medicine

## 2017-05-25 VITALS — BP 142/86 | HR 82 | Temp 97.8°F | Wt 171.8 lb

## 2017-05-25 DIAGNOSIS — M25531 Pain in right wrist: Secondary | ICD-10-CM | POA: Diagnosis not present

## 2017-05-25 DIAGNOSIS — I481 Persistent atrial fibrillation: Secondary | ICD-10-CM

## 2017-05-25 DIAGNOSIS — R5381 Other malaise: Secondary | ICD-10-CM

## 2017-05-25 DIAGNOSIS — R29898 Other symptoms and signs involving the musculoskeletal system: Secondary | ICD-10-CM

## 2017-05-25 DIAGNOSIS — K921 Melena: Secondary | ICD-10-CM

## 2017-05-25 DIAGNOSIS — R911 Solitary pulmonary nodule: Secondary | ICD-10-CM

## 2017-05-25 DIAGNOSIS — I4819 Other persistent atrial fibrillation: Secondary | ICD-10-CM

## 2017-05-25 DIAGNOSIS — E785 Hyperlipidemia, unspecified: Secondary | ICD-10-CM | POA: Diagnosis not present

## 2017-05-25 DIAGNOSIS — D649 Anemia, unspecified: Secondary | ICD-10-CM | POA: Diagnosis not present

## 2017-05-25 DIAGNOSIS — Z23 Encounter for immunization: Secondary | ICD-10-CM

## 2017-05-25 LAB — CBC
HCT: 39 % (ref 39.0–52.0)
Hemoglobin: 12.6 g/dL — ABNORMAL LOW (ref 13.0–17.0)
MCHC: 32.2 g/dL (ref 30.0–36.0)
MCV: 89.1 fl (ref 78.0–100.0)
Platelets: 197 10*3/uL (ref 150.0–400.0)
RBC: 4.38 Mil/uL (ref 4.22–5.81)
RDW: 16.1 % — ABNORMAL HIGH (ref 11.5–15.5)
WBC: 6.9 10*3/uL (ref 4.0–10.5)

## 2017-05-25 LAB — URIC ACID: Uric Acid, Serum: 3 mg/dL — ABNORMAL LOW (ref 4.0–7.8)

## 2017-05-25 NOTE — Progress Notes (Signed)
[name], MD Phone: [phone]  Carl [name] is a 81 y.o. male who presents today for follow-up.  Patient recently seen by cardiology for follow-up of his A. fib. He underwent cardioversion previously. Most recently was on IV amiodarone during hospitalization and converted spontaneously to normal sinus rhythm. He is on oral amiodarone and Eliquis. He notes no chest pain, shortness breath, palpitations, or edema. They advised to do a little bit of walking.  They also discussed starting on an iron supplement given his history of anemia. He underwent evaluation previously with colonoscopy that had multiple polyps. He notes he felt horribly after this and does not want to undergo the repeat colonoscopy that was recommended. He has had no recurrent bleeding. Bleeding was felt to be hemorrhoidal in nature.  Patient underwent evaluation at urgent care for right wrist pain. States he had x-rays that did not reveal any fractures. They thought maybe it was gout and placed him on prednisone. This cleared up within 1-2 days. Notes the area was swollen and red and warm.  He continues on Crestor. No myalgias.  Notes he did quite well with home health physical therapy previously to help strengthen his legs. He notes some difficulty with strength in his legs that has persisted and is interested in undergoing physical therapy evaluation again. No acute changes.  PMH: Former smoker   ROS see history of present illness  Objective  Physical Exam Vitals:   05/25/17 1001  BP: (!) 142/86  Pulse: 82  Temp: 97.8 F (36.6 C)  SpO2: 97%    BP Readings from Last 3 Encounters:  05/25/17 (!) 142/86  05/22/17 120/76  04/01/17 (!) 142/85   Wt Readings from Last 3 Encounters:  05/25/17 171 lb 12.8 oz (77.9 kg)  05/22/17 171 lb 8 oz (77.8 kg)  04/01/17 166 lb (75.3 kg)    Physical Exam  Constitutional: No distress.  Cardiovascular: Normal rate, regular rhythm and normal heart sounds.     Pulmonary/Chest: Effort normal and breath sounds normal.  Musculoskeletal: He exhibits no edema.  Bilateral wrist with no swelling, warmth, tenderness, or erythema, 5/5 strength bilateral grips, quads, hamstrings, and plantar flexion and dorsiflexion, sensation light touch intact in bilateral hands and lower extremities  Neurological: He is alert.  Skin: Skin is warm and dry. He is not diaphoretic.     Assessment/Plan: Please see individual problem list.  Persistent atrial fibrillation (HCC) Normal sinus rhythm. Underwent spontaneous conversion to sinus rhythm on IV amiodarone. Has maintained sinus rhythm. Asymptomatic. He'll continue to follow with cardiology.  Blood in stool No recurrence. Has been anemic. Recheck CBC and iron panel.  Dyslipidemia Continue Crestor.  Physical deconditioning We'll have patient evaluated by physical therapy again.  Normocytic anemia Recheck labs.  Wrist pain, acute, right Sounds like gout. Check uric acid.  Lung nodule seen on imaging study: 7 mm nodule left midlung Likely granuloma based on chest x-ray reports. No firm recommendations for interval follow-up given no they did note could be followed up on with repeat radiographs. Plan to repeat chest x-ray in 2 months. Patient can walk in for this. Order has been placed.   Orders Placed This Encounter  Procedures  . DG Chest 2 View    Standing Status:   Future    Standing Expiration Date:   07/25/2018    Order Specific Question:   Reason for Exam (SYMPTOM  OR DIAGNOSIS REQUIRED)    Answer:   follow-up lung nodule    Order Specific Question:  Preferred imaging location?    Answer:   [facility] Specific Question:   Radiology Contrast Protocol - do NOT remove file path    Answer:   [email or web address]  . Flu vaccine HIGH DOSE PF  . Uric acid  . Iron, TIBC and Ferritin Panel  . CBC  . Ambulatory referral to Home Health     Referral Priority:   Routine    Referral Type:   Home Health Care    Referral Reason:   Specialty Services Required    Requested Specialty:   [place]    Number of Visits Requested:   [place], MD [place]

## 2017-05-25 NOTE — Assessment & Plan Note (Signed)
Sounds like gout. Check uric acid.

## 2017-05-25 NOTE — Assessment & Plan Note (Signed)
Likely granuloma based on chest x-ray reports. No firm recommendations for interval follow-up given no they did note could be followed up on with repeat radiographs. Plan to repeat chest x-ray in 2 months. Patient can walk in for this. Order has been placed.

## 2017-05-25 NOTE — Assessment & Plan Note (Signed)
No recurrence. Has been anemic. Recheck CBC and iron panel.

## 2017-05-25 NOTE — Assessment & Plan Note (Signed)
Continue Crestor 

## 2017-05-25 NOTE — Assessment & Plan Note (Signed)
Normal sinus rhythm. Underwent spontaneous conversion to sinus rhythm on IV amiodarone. Has maintained sinus rhythm. Asymptomatic. He'll continue to follow with cardiology.

## 2017-05-25 NOTE — Patient Instructions (Signed)
Nice to see you. I'm glad you are doing well. We'll check lab work today and contact you with the results. We'll get physical therapy back out to evaluate you.

## 2017-05-25 NOTE — Assessment & Plan Note (Signed)
We'll have patient evaluated by physical therapy again.

## 2017-05-25 NOTE — Assessment & Plan Note (Signed)
Recheck labs 

## 2017-05-26 LAB — IRON,TIBC AND FERRITIN PANEL
%SAT: 16 % (calc) (ref 15–60)
Ferritin: 19 ng/mL — ABNORMAL LOW (ref 20–380)
Iron: 53 ug/dL (ref 50–180)
TIBC: 335 mcg/dL (calc) (ref 250–425)

## 2017-05-30 ENCOUNTER — Other Ambulatory Visit: Payer: Self-pay | Admitting: Family Medicine

## 2017-05-30 DIAGNOSIS — D649 Anemia, unspecified: Secondary | ICD-10-CM

## 2017-05-30 MED ORDER — FERROUS SULFATE 325 (65 FE) MG PO TABS: 325.0000 mg | ORAL_TABLET | Freq: Every day | ORAL | 1 refills | Status: DC

## 2017-07-22 ENCOUNTER — Other Ambulatory Visit: Payer: Self-pay | Admitting: Family Medicine

## 2017-08-20 ENCOUNTER — Other Ambulatory Visit: Payer: Self-pay | Admitting: Nurse Practitioner

## 2017-09-03 ENCOUNTER — Other Ambulatory Visit: Payer: Self-pay | Admitting: Cardiology

## 2017-09-10 ENCOUNTER — Encounter: Payer: Self-pay | Admitting: Family Medicine

## 2017-09-10 ENCOUNTER — Ambulatory Visit (INDEPENDENT_AMBULATORY_CARE_PROVIDER_SITE_OTHER): Payer: Medicare Other | Admitting: Family Medicine

## 2017-09-10 ENCOUNTER — Other Ambulatory Visit: Payer: Self-pay | Admitting: Cardiology

## 2017-09-10 ENCOUNTER — Ambulatory Visit (INDEPENDENT_AMBULATORY_CARE_PROVIDER_SITE_OTHER): Payer: Medicare Other

## 2017-09-10 VITALS — BP 122/70 | HR 84 | Temp 98.0°F | Resp 18 | Wt 163.2 lb

## 2017-09-10 DIAGNOSIS — J189 Pneumonia, unspecified organism: Secondary | ICD-10-CM | POA: Diagnosis not present

## 2017-09-10 DIAGNOSIS — R05 Cough: Secondary | ICD-10-CM

## 2017-09-10 DIAGNOSIS — R059 Cough, unspecified: Secondary | ICD-10-CM

## 2017-09-10 LAB — POCT INFLUENZA A/B
Influenza A, POC: NEGATIVE
Influenza B, POC: NEGATIVE

## 2017-09-10 MED ORDER — DOXYCYCLINE HYCLATE 100 MG PO TABS: 100.0000 mg | ORAL_TABLET | Freq: Two times a day (BID) | ORAL | 0 refills | Status: DC

## 2017-09-10 NOTE — Patient Instructions (Addendum)
Please take medication as directed with food.  Please drink plenty of water so that your urine is pale yellow or clear. Also, get plenty of rest and follow up sooner  if symptoms do not improve in 3 to 4 days, worsen, or you develop a fever >101.  September 15, 2017 at 3:15pm you have a follow up appointment with Dr. Biagio Quint.   Community-Acquired Pneumonia, Adult Pneumonia is an infection of the lungs. One type of pneumonia can happen while a person is in a hospital. A different type can happen when a person is not in a hospital (community-acquired pneumonia). It is easy for this kind to spread from person to person. It can spread to you if you breathe near an infected person who coughs or sneezes. Some symptoms include:  A dry cough.  A wet (productive) cough.  Fever.  Sweating.  Chest pain.  Follow these instructions at home:  Take over-the-counter and prescription medicines only as told by your doctor. ? Only take cough medicine if you are losing sleep. ? If you were prescribed an antibiotic medicine, take it as told by your doctor. Do not stop taking the antibiotic even if you start to feel better.  Sleep with your head and neck raised (elevated). You can do this by putting a few pillows under your head, or you can sleep in a recliner.  Do not use tobacco products. These include cigarettes, chewing tobacco, and e-cigarettes. If you need help quitting, ask your doctor.  Drink enough water to keep your pee (urine) clear or pale yellow. A shot (vaccine) can help prevent pneumonia. Shots are often suggested for:  People older than 80 years of age.  People older than 80 years of age: ? Who are having cancer treatment. ? Who have long-term (chronic) lung disease. ? Who have problems with their body's defense system (immune system).  You may also prevent pneumonia if you take these actions:  Get the flu (influenza) shot every year.  Go to the dentist as often as told.  Wash  your hands often. If soap and water are not available, use hand sanitizer.  Contact a doctor if:  You have a fever.  You lose sleep because your cough medicine does not help. Get help right away if:  You are short of breath and it gets worse.  You have more chest pain.  Your sickness gets worse. This is very serious if: ? You are an older adult. ? Your body's defense system is weak.  You cough up blood. This information is not intended to replace advice given to you by your health care provider. Make sure you discuss any questions you have with your health care provider. Document Released: 12/31/2007 Document Revised: 12/20/2015 Document Reviewed: 11/08/2014 Elsevier Interactive Patient Education  [employer].

## 2017-09-10 NOTE — Progress Notes (Signed)
Patient ID: Carl [name], male   DOB: 07-27-1936, 82 y.o.   MRN: [id number]  PCP: Carl Haven, MD  Subjective:  Carl [name] is a 82 y.o. year old very pleasant male patient who presents acutely ill with symptoms including fever, body aches, decreased appetite, and cough productive of yellow mucus. -other symptoms: loss of appetite and one episode of loose stool. Denies watery or bloody stool -started: one week ago -inside 48 hour treatment window if needed for tamiflu: No -high risk condition: 82 year old with history of cardiomyopathy ischemic, CHF, Atrial fibrillation with RVR -symptoms are not improving  -previous treatments: None - patient did receive flu shot this year.  -  sick contact; specifically influenza: Yes, 3 family members have been positive for flu  ROS-denies SOB, chest pain, NV, sinus, dental pain, ear pain/pressure, nasal congestion, sore throat, or dental pain  Pertinent Past Medical History-  Patient Active Problem List   Diagnosis Date Noted  . Wrist pain, acute, right 05/25/2017  . Low back pain 03/24/2017  . Acute cystitis without hematuria 03/24/2017  . Lung nodule seen on imaging study: 7 mm nodule left midlung 03/13/2017  . Physical deconditioning 03/13/2017  . Normocytic anemia 03/13/2017  . Fall 03/12/2017  . Abdominal soreness 03/12/2017  . Generalized weakness 03/12/2017  . Atrial fibrillation with RVR ([place]) 03/12/2017  . Leukocytosis 03/12/2017  . Hyponatremia 03/12/2017  . Orthostatic hypotension 02/16/2017  . Blood in stool 09/19/2016  . Anticoagulated 09/19/2016  . History of prostate cancer 09/04/2016  . Persistent atrial fibrillation ([place]) 09/04/2016  . Acute congestive heart failure ([place]) 09/04/2016  . Cardiomyopathy, ischemic 07/16/2012  . CKD (chronic kidney disease) stage 2, GFR 60-89 ml/min 07/16/2012  . Essential hypertension 08/05/2011  . Murmur 08/05/2011  . 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 Dyslipidemia  02/04/2011    Medications- reviewed  Current Outpatient Medications  Medication Sig Dispense Refill  . acetaminophen (TYLENOL) 500 MG tablet Take 500 mg by mouth every 6 (six) hours as needed (for pain).     [name] amiodarone (PACERONE) 200 MG tablet Take 1 tablet (200 mg total) by mouth daily. 30 tablet 11  . apixaban (ELIQUIS) 5 MG TABS tablet Take 1 tablet (5 mg total) by mouth 2 (two) times daily. 60 tablet 11  . bicalutamide (CASODEX) 50 MG tablet Take 50 mg by mouth daily with breakfast.   5  . ferrous sulfate 325 (65 FE) MG tablet TAKE 1 TABLET BY MOUTH EVERY DAY WITH BREAKFAST 30 tablet 1  . omeprazole (PRILOSEC) 20 MG capsule Take 20 mg by mouth daily.    . rosuvastatin (CRESTOR) 40 MG tablet Take 40 mg by mouth daily.    . rosuvastatin (CRESTOR) 40 MG tablet TAKE 1 TABLET BY MOUTH EVERY DAY 30 tablet 3   No current facility-administered medications for this visit.     Objective: BP 122/70 (BP Location: Left Arm, Patient Position: Sitting, Cuff Size: Normal)   Pulse 84   Temp 98 F (36.7 C) (Oral)   Resp 18   Wt 163 lb 4 oz (74 kg)   SpO2 95%   BMI 22.77 kg/m  Gen: NAD, appears fatigued HEENT: Turbinates erythematous, TMs normal bilaterally, oropharynx clear and moist  with no tonsilar exudate or edema, no sinus tenderness CV: RRR no murmurs rubs or gallops Lungs: rales in RLL, no wheeze or rhonchi noted Abdomen: soft/nontender/nondistended/normal bowel sounds. Ext: no edema Skin: warm, dry, no rash   Assessment/Plan: 1.  Community acquired pneumonia, unspecified laterality POC influenza negative. Acutely ill appearance with myalgias,history of fever, cough productive of yellow sputum and rales noted in RLL are concerning for CAP and less likely CHF. CURB criteria score: 1 as he is 82 years old;  will obtain chest X-ray and empirically treat with doxycycline. Doxycycline chosen as patient also takes amiodarone and concern for interaction with levaquin and risk of cardiac  arrhythmias. Follow up appointment made in 5 days with PCP for reassessment.    - DG Chest 2 View; Future  2. Cough  - POCT Influenza A/B  Finally, we reviewed reasons to return to care sooner  including if symptoms worsen, persist or new concerns arise particularly SOB, chest pain, hemoptysis, or he is not feeling better.    Laurita Quint, FNP

## 2017-09-15 ENCOUNTER — Encounter: Payer: Self-pay | Admitting: Family Medicine

## 2017-09-15 ENCOUNTER — Ambulatory Visit (INDEPENDENT_AMBULATORY_CARE_PROVIDER_SITE_OTHER): Payer: Medicare Other | Admitting: Family Medicine

## 2017-09-15 VITALS — BP 122/70 | HR 86 | Temp 97.5°F | Wt 163.6 lb

## 2017-09-15 DIAGNOSIS — K635 Polyp of colon: Secondary | ICD-10-CM | POA: Diagnosis not present

## 2017-09-15 DIAGNOSIS — J181 Lobar pneumonia, unspecified organism: Secondary | ICD-10-CM | POA: Diagnosis not present

## 2017-09-15 DIAGNOSIS — K219 Gastro-esophageal reflux disease without esophagitis: Secondary | ICD-10-CM

## 2017-09-15 DIAGNOSIS — J189 Pneumonia, unspecified organism: Secondary | ICD-10-CM | POA: Insufficient documentation

## 2017-09-15 MED ORDER — OMEPRAZOLE 20 MG PO CPDR: 20.0000 mg | DELAYED_RELEASE_CAPSULE | Freq: Every day | ORAL | 1 refills | Status: DC

## 2017-09-15 NOTE — Assessment & Plan Note (Signed)
Patient is doing quite a bit better on doxycycline.  He will finish his course of antibiotics.  We will check a BMP to ensure adequate patient given recent illness.  He will return in 3-4 weeks for an x-ray that has already been ordered.  He is given return precautions.

## 2017-09-15 NOTE — Progress Notes (Signed)
[name], MD Phone: [phone]  Carl [name] is a 82 y.o. male who presents today for follow-up.  Patient seen last week by a nurse practitioner and diagnosed with pneumonia.  X-ray revealed pneumonia as well.  He has been on doxycycline twice daily notes he has improved quite a bit.  Cough is improved.  He was having "lung pain."  And that has resolved.  Congestion is lessening.  Coughing up some yellow mucus.  No shortness of breath or fevers.  He has been staying hydrated with water and Gatorade.  Overall feels a lot better.  GERD: Notes he takes omeprazole daily.  Has been out of this though has had minimal symptoms.  No blood in his stool.  No abdominal pain.  Notes the omeprazole keep things at [place].  He has a history of numerous colon polyps.  It was recommended that he have a follow-up colonoscopy though he previously declined this.  He has not had any issues.  I discussed the risks of not undergoing colonoscopy and the benefits of undergoing colonoscopy.  Social History   Tobacco Use  Smoking Status Former Smoker  . Years: 2.00  . Types: Cigarettes  . Last attempt to quit: 07/28/1960  . Years since quitting: 57.1  Smokeless Tobacco Never Used     ROS see history of present illness  Objective  Physical Exam Vitals:   09/15/17 1458  BP: 122/70  Pulse: 86  Temp: (!) 97.5 F (36.4 C)  SpO2: 96%    BP Readings from Last 3 Encounters:  09/15/17 122/70  09/10/17 122/70  05/25/17 (!) 142/86   Wt Readings from Last 3 Encounters:  09/15/17 163 lb 9.6 oz (74.2 kg)  09/10/17 163 lb 4 oz (74 kg)  05/25/17 171 lb 12.8 oz (77.9 kg)    Physical Exam  Constitutional: No distress.  Cardiovascular: Normal heart sounds.  Irregularly irregular  Pulmonary/Chest: Effort normal. No respiratory distress. He has no wheezes.  Slight crackles right lower lung field  Abdominal: Soft. Bowel sounds are normal. He exhibits no distension. There is no tenderness. There is  no rebound and no guarding.  Musculoskeletal: He exhibits no edema.  Neurological: He is alert. Gait normal.  Skin: Skin is warm and dry. He is not diaphoretic.     Assessment/Plan: Please see individual problem list.  Community acquired pneumonia of right lower lobe of lung [facility]) Patient is doing quite a bit better on doxycycline.  He will finish his course of antibiotics.  We will check a BMP to ensure adequate patient given recent illness.  He will return in 3-4 weeks for an x-ray that has already been ordered.  He is given return precautions.  Colon polyps Patient was due for recall colonoscopy late last year though he declined having this done at that time.  He is now ready to consider this.  Referral placed back to GI.  GERD (gastroesophageal reflux disease) Has been adequately controlled on Prilosec.  Refill given.   Orders Placed This Encounter  Procedures  . Basic Metabolic Panel (BMET)  . Ambulatory referral to Gastroenterology    Referral Priority:   Routine    Referral Type:   Consultation    Referral Reason:   Specialty Services Required    Number of Visits Requested:   1    Meds ordered this encounter  Medications  . omeprazole (PRILOSEC) 20 MG capsule    Sig: Take 1 capsule (20 mg total) by mouth daily.    Dispense:  90 capsule    Refill:  1     [name], MD [place]

## 2017-09-15 NOTE — Patient Instructions (Addendum)
Nice to see you. I am glad you are doing better from your pneumonia. Please finish the course of doxycycline. We will check lab work today and contact you with the results. I refilled your omeprazole. Shortness of breath, cough productive of blood, fevers, or any new or changing symptoms please seek medical attention.

## 2017-09-15 NOTE — Assessment & Plan Note (Signed)
Patient was due for recall colonoscopy late last year though he declined having this done at that time.  He is now ready to consider this.  Referral placed back to GI.

## 2017-09-15 NOTE — Assessment & Plan Note (Addendum)
Has been adequately controlled on Prilosec.  Refill given.

## 2017-09-16 LAB — BASIC METABOLIC PANEL
BUN: 21 mg/dL (ref 6–23)
CO2: 24 mEq/L (ref 19–32)
Calcium: 9.2 mg/dL (ref 8.4–10.5)
Chloride: 106 mEq/L (ref 96–112)
Creatinine, Ser: 1.67 mg/dL — ABNORMAL HIGH (ref 0.40–1.50)
GFR: 42.3 mL/min — ABNORMAL LOW (ref >60.00)
Glucose, Bld: 109 mg/dL — ABNORMAL HIGH (ref 70–99)
Potassium: 3.7 mEq/L (ref 3.5–5.1)
Sodium: 139 mEq/L (ref 135–145)

## 2017-09-29 ENCOUNTER — Other Ambulatory Visit: Payer: Self-pay | Admitting: Internal Medicine

## 2017-09-29 NOTE — Progress Notes (Signed)
Sent letter, unable to reach patient

## 2017-11-16 ENCOUNTER — Encounter: Payer: Self-pay | Admitting: Family Medicine

## 2017-11-24 ENCOUNTER — Telehealth: Payer: Self-pay

## 2017-11-24 DIAGNOSIS — N179 Acute kidney failure, unspecified: Secondary | ICD-10-CM

## 2017-11-24 DIAGNOSIS — J189 Pneumonia, unspecified organism: Secondary | ICD-10-CM

## 2017-11-24 NOTE — Telephone Encounter (Signed)
Patients grand daughter states she was told that she is no longer to be in charge of patients health. She states the patients daughter was to be in charge of patient health but is non compliant. I have scheduled patient for lab appointment, per labs form 09/15/17 you had requested to recheck his kidney function and also have a xray to recheck after having pneumonia. Please place orders. Also gave her the number to GI and she will call to make appointment.

## 2017-11-24 NOTE — Telephone Encounter (Signed)
Orders placed.

## 2017-11-30 ENCOUNTER — Other Ambulatory Visit: Payer: Medicare Other

## 2018-01-01 ENCOUNTER — Encounter: Payer: Self-pay | Admitting: Family Medicine

## 2018-01-01 ENCOUNTER — Ambulatory Visit (INDEPENDENT_AMBULATORY_CARE_PROVIDER_SITE_OTHER): Payer: Medicare Other | Admitting: Family Medicine

## 2018-01-01 VITALS — BP 150/88 | HR 79 | Temp 98.2°F | Ht 71.0 in | Wt 163.8 lb

## 2018-01-01 DIAGNOSIS — N179 Acute kidney failure, unspecified: Secondary | ICD-10-CM

## 2018-01-01 DIAGNOSIS — R350 Frequency of micturition: Secondary | ICD-10-CM | POA: Diagnosis not present

## 2018-01-01 DIAGNOSIS — K635 Polyp of colon: Secondary | ICD-10-CM | POA: Diagnosis not present

## 2018-01-01 DIAGNOSIS — J181 Lobar pneumonia, unspecified organism: Secondary | ICD-10-CM | POA: Diagnosis not present

## 2018-01-01 DIAGNOSIS — I10 Essential (primary) hypertension: Secondary | ICD-10-CM | POA: Diagnosis not present

## 2018-01-01 DIAGNOSIS — D649 Anemia, unspecified: Secondary | ICD-10-CM | POA: Diagnosis not present

## 2018-01-01 DIAGNOSIS — J189 Pneumonia, unspecified organism: Secondary | ICD-10-CM

## 2018-01-01 LAB — POCT URINALYSIS DIPSTICK
Bilirubin, UA: NEGATIVE
Blood, UA: POSITIVE
Glucose, UA: NEGATIVE
Ketones, UA: NEGATIVE
Nitrite, UA: POSITIVE
Protein, UA: POSITIVE — AB
Spec Grav, UA: 1.025 (ref 1.010–1.025)
Urobilinogen, UA: 0.2 E.U./dL
pH, UA: 6 (ref 5.0–8.0)

## 2018-01-01 LAB — CBC
HCT: 39.4 % (ref 39.0–52.0)
Hemoglobin: 13 g/dL (ref 13.0–17.0)
MCHC: 33 g/dL (ref 30.0–36.0)
MCV: 96.9 fl (ref 78.0–100.0)
Platelets: 196 10*3/uL (ref 150.0–400.0)
RBC: 4.07 Mil/uL — ABNORMAL LOW (ref 4.22–5.81)
RDW: 14.6 % (ref 11.5–15.5)
WBC: 6.1 10*3/uL (ref 4.0–10.5)

## 2018-01-01 LAB — URINALYSIS, MICROSCOPIC ONLY

## 2018-01-01 LAB — BASIC METABOLIC PANEL
BUN: 21 mg/dL (ref 6–23)
CO2: 24 mEq/L (ref 19–32)
Calcium: 9.2 mg/dL (ref 8.4–10.5)
Chloride: 105 mEq/L (ref 96–112)
Creatinine, Ser: 1.39 mg/dL (ref 0.40–1.50)
GFR: 52.23 mL/min — ABNORMAL LOW (ref >60.00)
Glucose, Bld: 103 mg/dL — ABNORMAL HIGH (ref 70–99)
Potassium: 4.3 mEq/L (ref 3.5–5.1)
Sodium: 138 mEq/L (ref 135–145)

## 2018-01-01 MED ORDER — CIPROFLOXACIN HCL 250 MG PO TABS: 250.0000 mg | ORAL_TABLET | Freq: Two times a day (BID) | ORAL | 0 refills | Status: DC

## 2018-01-01 NOTE — Assessment & Plan Note (Signed)
Recheck CBC and iron studies

## 2018-01-01 NOTE — Patient Instructions (Signed)
Nice to see you. We are going to treat you for a UTI with ciprofloxacin. We will contact you when your urine studies return. We will get lab work today and contact you with the results. Please contact [facility] at the number listed below to set up follow-up for your prior colonoscopy. [phone]

## 2018-01-01 NOTE — Assessment & Plan Note (Signed)
Needs to have repeat colonoscopy. GI tried to contact him though were unable to reach the patient. The patient noted he deferred the follow-up to focus on his prostate. I reinforced the importance of this and provided him with the number to call to set up an appointment for follow-up. He will have his daughter call to set this up.

## 2018-01-01 NOTE — Assessment & Plan Note (Signed)
Noted previously on labs. Needs follow-up labs.

## 2018-01-01 NOTE — Assessment & Plan Note (Addendum)
UA and symptoms most consistent with UTI.  No evidence of prostatitis on exam.  Will treat with ciprofloxacin.  Send for culture and microscopy.  He will see his urologist next week.

## 2018-01-01 NOTE — Assessment & Plan Note (Signed)
Patient never turned for a follow-up x-ray.  This was noted after the patient left the office today.  I will have the CMA contact the patient to get him set up for this.

## 2018-01-01 NOTE — Progress Notes (Signed)
[name], MD Phone: [phone]  Carl [name] is a 82 y.o. male who presents today for same day visit.   CC: urine frequency  Patient reports about a month of urinary frequency and urgency with no dysuria or hematuria.  In the past has been on Flomax though is no longer taking this.  He had a UTI about 4 to 5 months ago and was treated with ciprofloxacin.  No straining.  Notes he does start and stop.  He feels like he fully empties his bladder.  He does have a history of renal stone.  Does have a history of prostate cancer with brachii therapy in 1997.  Hypertension: Has not been elevated recently.  He is not checking at home.  He notes no chest pain, shortness of breath, or edema.  He never saw GI for follow-up colonoscopy.  He notes no blood in his stool.  Social History   Tobacco Use  Smoking Status Former Smoker  . Years: 2.00  . Types: Cigarettes  . Last attempt to quit: 07/28/1960  . Years since quitting: 57.4  Smokeless Tobacco Never Used     ROS see history of present illness  Objective  Physical Exam Vitals:   01/01/18 1044  BP: (!) 150/88  Pulse: 79  Temp: 98.2 F (36.8 C)  SpO2: 97%    BP Readings from Last 3 Encounters:  01/01/18 (!) 150/88  09/15/17 122/70  09/10/17 122/70   Wt Readings from Last 3 Encounters:  01/01/18 163 lb 12.8 oz (74.3 kg)  09/15/17 163 lb 9.6 oz (74.2 kg)  09/10/17 163 lb 4 oz (74 kg)    Physical Exam  Constitutional: No distress.  Cardiovascular: Normal rate, regular rhythm and normal heart sounds.  Pulmonary/Chest: Effort normal and breath sounds normal.  Abdominal: Soft. Bowel sounds are normal. He exhibits no distension. There is no tenderness.  Genitourinary:  Genitourinary Comments: Moderately enlarged prostate, no nodules, no tenderness, no bogginess  Musculoskeletal: He exhibits no edema.  Neurological: He is alert.  Skin: Skin is warm and dry. He is not diaphoretic.     Assessment/Plan: Please  see individual problem list.  Colon polyps Needs to have repeat colonoscopy. GI tried to contact him though were unable to reach the patient. The patient noted he deferred the follow-up to focus on his prostate. I reinforced the importance of this and provided him with the number to call to set up an appointment for follow-up. He will have his daughter call to set this up.   AKI (acute kidney injury) ([place]) Noted previously on labs. Needs follow-up labs.   Frequent urination UA and symptoms most consistent with UTI.  No evidence of prostatitis on exam.  Will treat with ciprofloxacin.  Send for culture and microscopy.  He will see his urologist next week.  Normocytic anemia Recheck CBC and iron studies.  Essential hypertension Elevated today though had been well controlled previously.  He will start checking at home.  He will return in 1 to 2 weeks for nurse blood pressure check.  Community acquired pneumonia of right lower lobe of lung [facility]) Patient never turned for a follow-up x-ray.  This was noted after the patient left the office today.  I will have the CMA contact the patient to get him set up for this.   Orders Placed This Encounter  Procedures  . Urine Culture  . Basic Metabolic Panel (BMET)  . CBC  . Iron, TIBC and Ferritin Panel  . Urine Microscopic Only  .  POCT Urinalysis Dipstick    Meds ordered this encounter  Medications  . ciprofloxacin (CIPRO) 250 MG tablet    Sig: Take 1 tablet (250 mg total) by mouth 2 (two) times daily.    Dispense:  10 tablet    Refill:  0     [name], MD [place]

## 2018-01-01 NOTE — Progress Notes (Signed)
Patients grand daughter notified and will bring the patient in next week for repeat xray

## 2018-01-01 NOTE — Assessment & Plan Note (Addendum)
Elevated today though had been well controlled previously.  He will start checking at home.  He will return in 1 to 2 weeks for nurse blood pressure check.

## 2018-01-02 ENCOUNTER — Other Ambulatory Visit: Payer: Self-pay | Admitting: Family Medicine

## 2018-01-02 LAB — IRON,TIBC AND FERRITIN PANEL
%SAT: 13 % (calc) — ABNORMAL LOW (ref 15–60)
Ferritin: 96 ng/mL (ref 20–380)
Iron: 32 ug/dL — ABNORMAL LOW (ref 50–180)
TIBC: 241 mcg/dL (calc) — ABNORMAL LOW (ref 250–425)

## 2018-01-02 MED ORDER — FERROUS SULFATE 325 (65 FE) MG PO TABS: ORAL_TABLET | ORAL | 1 refills | Status: DC

## 2018-01-04 ENCOUNTER — Inpatient Hospital Stay (HOSPITAL_COMMUNITY)
Admission: EM | Admit: 2018-01-04 | Discharge: 2018-01-06 | DRG: 690 | Disposition: A | Discharge status: Home Health | Payer: Medicare Other | Attending: Internal Medicine | Admitting: Internal Medicine

## 2018-01-04 ENCOUNTER — Emergency Department (HOSPITAL_COMMUNITY): Payer: Medicare Other

## 2018-01-04 ENCOUNTER — Encounter (HOSPITAL_COMMUNITY): Payer: Self-pay

## 2018-01-04 ENCOUNTER — Other Ambulatory Visit: Payer: Self-pay

## 2018-01-04 DIAGNOSIS — Z7901 Long term (current) use of anticoagulants: Secondary | ICD-10-CM

## 2018-01-04 DIAGNOSIS — I255 Ischemic cardiomyopathy: Secondary | ICD-10-CM | POA: Diagnosis present

## 2018-01-04 DIAGNOSIS — N183 Chronic kidney disease, stage 3 (moderate): Secondary | ICD-10-CM | POA: Diagnosis present

## 2018-01-04 DIAGNOSIS — Z87891 Personal history of nicotine dependence: Secondary | ICD-10-CM

## 2018-01-04 DIAGNOSIS — I2510 Atherosclerotic heart disease of native coronary artery without angina pectoris: Secondary | ICD-10-CM | POA: Diagnosis present

## 2018-01-04 DIAGNOSIS — Z23 Encounter for immunization: Secondary | ICD-10-CM

## 2018-01-04 DIAGNOSIS — M25569 Pain in unspecified knee: Secondary | ICD-10-CM

## 2018-01-04 DIAGNOSIS — Z8551 Personal history of malignant neoplasm of bladder: Secondary | ICD-10-CM

## 2018-01-04 DIAGNOSIS — I4811 Longstanding persistent atrial fibrillation: Secondary | ICD-10-CM | POA: Diagnosis present

## 2018-01-04 DIAGNOSIS — N179 Acute kidney failure, unspecified: Secondary | ICD-10-CM | POA: Diagnosis present

## 2018-01-04 DIAGNOSIS — N3020 Other chronic cystitis without hematuria: Secondary | ICD-10-CM | POA: Diagnosis present

## 2018-01-04 DIAGNOSIS — M25461 Effusion, right knee: Secondary | ICD-10-CM | POA: Diagnosis present

## 2018-01-04 DIAGNOSIS — M25561 Pain in right knee: Secondary | ICD-10-CM | POA: Diagnosis present

## 2018-01-04 DIAGNOSIS — Z809 Family history of malignant neoplasm, unspecified: Secondary | ICD-10-CM

## 2018-01-04 DIAGNOSIS — I130 Hypertensive heart and chronic kidney disease with heart failure and stage 1 through stage 4 chronic kidney disease, or unspecified chronic kidney disease: Secondary | ICD-10-CM | POA: Diagnosis present

## 2018-01-04 DIAGNOSIS — Z8249 Family history of ischemic heart disease and other diseases of the circulatory system: Secondary | ICD-10-CM

## 2018-01-04 DIAGNOSIS — K219 Gastro-esophageal reflux disease without esophagitis: Secondary | ICD-10-CM | POA: Diagnosis present

## 2018-01-04 DIAGNOSIS — Z923 Personal history of irradiation: Secondary | ICD-10-CM

## 2018-01-04 DIAGNOSIS — I481 Persistent atrial fibrillation: Secondary | ICD-10-CM | POA: Diagnosis present

## 2018-01-04 DIAGNOSIS — Z951 Presence of aortocoronary bypass graft: Secondary | ICD-10-CM

## 2018-01-04 DIAGNOSIS — N209 Urinary calculus, unspecified: Secondary | ICD-10-CM

## 2018-01-04 DIAGNOSIS — M11261 Other chondrocalcinosis, right knee: Secondary | ICD-10-CM | POA: Diagnosis present

## 2018-01-04 DIAGNOSIS — N201 Calculus of ureter: Secondary | ICD-10-CM | POA: Diagnosis present

## 2018-01-04 DIAGNOSIS — N390 Urinary tract infection, site not specified: Secondary | ICD-10-CM | POA: Diagnosis present

## 2018-01-04 DIAGNOSIS — N136 Pyonephrosis: Principal | ICD-10-CM | POA: Diagnosis present

## 2018-01-04 DIAGNOSIS — I5032 Chronic diastolic (congestive) heart failure: Secondary | ICD-10-CM | POA: Diagnosis present

## 2018-01-04 DIAGNOSIS — I4819 Other persistent atrial fibrillation: Secondary | ICD-10-CM | POA: Diagnosis present

## 2018-01-04 DIAGNOSIS — Z1624 Resistance to multiple antibiotics: Secondary | ICD-10-CM | POA: Diagnosis present

## 2018-01-04 DIAGNOSIS — N10 Acute tubulo-interstitial nephritis: Secondary | ICD-10-CM

## 2018-01-04 DIAGNOSIS — Z79899 Other long term (current) drug therapy: Secondary | ICD-10-CM

## 2018-01-04 DIAGNOSIS — C61 Malignant neoplasm of prostate: Secondary | ICD-10-CM | POA: Diagnosis present

## 2018-01-04 DIAGNOSIS — E785 Hyperlipidemia, unspecified: Secondary | ICD-10-CM | POA: Diagnosis present

## 2018-01-04 DIAGNOSIS — D509 Iron deficiency anemia, unspecified: Secondary | ICD-10-CM | POA: Diagnosis present

## 2018-01-04 DIAGNOSIS — I340 Nonrheumatic mitral (valve) insufficiency: Secondary | ICD-10-CM | POA: Diagnosis present

## 2018-01-04 DIAGNOSIS — B965 Pseudomonas (aeruginosa) (mallei) (pseudomallei) as the cause of diseases classified elsewhere: Secondary | ICD-10-CM | POA: Diagnosis present

## 2018-01-04 DIAGNOSIS — Z87442 Personal history of urinary calculi: Secondary | ICD-10-CM

## 2018-01-04 DIAGNOSIS — I10 Essential (primary) hypertension: Secondary | ICD-10-CM | POA: Diagnosis present

## 2018-01-04 DIAGNOSIS — I440 Atrioventricular block, first degree: Secondary | ICD-10-CM | POA: Diagnosis present

## 2018-01-04 LAB — URINE CULTURE
MICRO NUMBER:: 90686831
SPECIMEN QUALITY:: ADEQUATE

## 2018-01-04 LAB — CBC WITH DIFFERENTIAL/PLATELET
Abs Immature Granulocytes: 0 10*3/uL (ref 0.0–0.1)
Basophils Absolute: 0 10*3/uL (ref 0.0–0.1)
Basophils Relative: 0 %
Eosinophils Absolute: 0 10*3/uL (ref 0.0–0.7)
Eosinophils Relative: 0 %
HCT: 42.2 % (ref 39.0–52.0)
Hemoglobin: 13.2 g/dL (ref 13.0–17.0)
Immature Granulocytes: 0 %
Lymphocytes Relative: 16 %
Lymphs Abs: 1.1 10*3/uL (ref 0.7–4.0)
MCH: 30.8 pg (ref 26.0–34.0)
MCHC: 31.3 g/dL (ref 30.0–36.0)
MCV: 98.6 fL (ref 78.0–100.0)
Monocytes Absolute: 0.5 10*3/uL (ref 0.1–1.0)
Monocytes Relative: 8 %
Neutro Abs: 5.3 10*3/uL (ref 1.7–7.7)
Neutrophils Relative %: 76 %
Platelets: 213 10*3/uL (ref 150–400)
RBC: 4.28 MIL/uL (ref 4.22–5.81)
RDW: 13.6 % (ref 11.5–15.5)
WBC: 7 10*3/uL (ref 4.0–10.5)

## 2018-01-04 LAB — COMPREHENSIVE METABOLIC PANEL
ALT: 21 U/L (ref 17–63)
AST: 19 U/L (ref 15–41)
Albumin: 3.7 g/dL (ref 3.5–5.0)
Alkaline Phosphatase: 92 U/L (ref 38–126)
Anion gap: 9 (ref 5–15)
BUN: 20 mg/dL (ref 6–20)
CO2: 24 mmol/L (ref 22–32)
Calcium: 9.2 mg/dL (ref 8.9–10.3)
Chloride: 108 mmol/L (ref 101–111)
Creatinine, Ser: 1.72 mg/dL — ABNORMAL HIGH (ref 0.61–1.24)
GFR calc Af Amer: 41 mL/min — ABNORMAL LOW (ref >60)
GFR calc non Af Amer: 36 mL/min — ABNORMAL LOW (ref >60)
Glucose, Bld: 105 mg/dL — ABNORMAL HIGH (ref 65–99)
Potassium: 4.5 mmol/L (ref 3.5–5.1)
Sodium: 141 mmol/L (ref 135–145)
Total Bilirubin: 0.4 mg/dL (ref 0.3–1.2)
Total Protein: 6.6 g/dL (ref 6.5–8.1)

## 2018-01-04 LAB — URINALYSIS, ROUTINE W REFLEX MICROSCOPIC
Bilirubin Urine: NEGATIVE
Glucose, UA: NEGATIVE mg/dL
Ketones, ur: NEGATIVE mg/dL
Nitrite: NEGATIVE
Protein, ur: 30 mg/dL — AB
RBC / HPF: >50 RBC/hpf — ABNORMAL HIGH (ref 0–5)
Specific Gravity, Urine: 1.018 (ref 1.005–1.030)
WBC, UA: >50 WBC/hpf — ABNORMAL HIGH (ref 0–5)
pH: 5 (ref 5.0–8.0)

## 2018-01-04 LAB — I-STAT CG4 LACTIC ACID, ED: Lactic Acid, Venous: 1.2 mmol/L (ref 0.5–1.9)

## 2018-01-04 MED ORDER — SODIUM CHLORIDE 0.9 % IV BOLUS
1000.0000 mL | Freq: Once | INTRAVENOUS | Status: AC
  Administered 2018-01-05: 1000 mL via INTRAVENOUS

## 2018-01-04 NOTE — ED Triage Notes (Signed)
Pt daughter at bedside reports that pt was seen at PCP and given Cipro for UTI, states that PCP called her back today and reports that they are concerned for a kidney infection and PCP want spt admitted for IV antibiotics.

## 2018-01-04 NOTE — ED Provider Notes (Signed)
[facility] EMERGENCY DEPARTMENT Provider Note   CSN: [id number] Arrival date & time: 01/04/18  1552     History   Chief Complaint Chief Complaint  Patient presents with  . Urinary Frequency    HPI Carl [name] is a 82 y.o. male.  Patient presents with concern for urinary tract infection and urinary frequency.  States he said pain with urination for the past week and lower abdominal pain only when he coughs.  He saw his PCP on June 7 and was prescribed Cipro for UTI.  Culture is now growing Pseudomonas which is multidrug-resistant.  Patient still has symptoms of pain and frequency with urination.  Denies any fever, chills, nausea or vomiting.  No diarrhea.  No chest pain or shortness of breath.  Has a history of kidney stones and prostate cancer as well.  He denies any leg pain or leg swelling.  He denies any testicular pain. He does take eliquis for history of atrial fibrillation.  The history is provided by the patient.  Urinary Frequency  Associated symptoms include abdominal pain. Pertinent negatives include no chest pain, no headaches and no shortness of breath.    Past Medical History:  Diagnosis Date  . Anemia   . Arthritis    arms  . Bladder calculus   . Chronic anxiety   . CKD (chronic kidney disease), stage III ([place])   . Coronary artery disease CARDIOLOGIST-  DR Loralie Champagne   a. CABG 2006 with  LIMA-LAD, SVG-Ramus, SVG-OM, seq SVG-PLV-PDA.  [name] GERD (gastroesophageal reflux disease)   . History of prostate cancer urologist-  dr Risa Grill   hx radioactive prostate seed implants 1997--  currently on Lupron injection  . Hyperlipidemia   . Hyperplasia of prostate without lower urinary tract symptoms (LUTS)   . Hypertension   . Ischemic cardiomyopathy    echo (01/13) 45-50% septal akinesis, mild MR/  echo  (01/16) ef 60-65%, mid-apical anteroseptal hypokinesis  . Lung nodule   . Moderate mitral regurgitation   . Orthostatic hypotension   . PAC  (premature atrial contraction)   . PAF (paroxysmal atrial fibrillation) ([place])    a. prior DCCV 10/2016.  . Paroxysmal atrial flutter ([place])    a. dx 02/2017, started on amiodarone.  . S/P CABG x 5 04/04/2005   LIMA to LAD,  SVG to Ramus,  SVG to OM,  seqSVG to PLV and PDA  . Wears glasses     Patient Active Problem List   Diagnosis Date Noted  . Frequent urination 01/01/2018  . AKI (acute kidney injury) ([place]) 01/01/2018  . Community acquired pneumonia of right lower lobe of lung ([place]) 09/15/2017  . Colon polyps 09/15/2017  . GERD (gastroesophageal reflux disease) 09/15/2017  . Wrist pain, acute, right 05/25/2017  . Low back pain 03/24/2017  . Lung nodule seen on imaging study: 7 mm nodule left midlung 03/13/2017  . Physical deconditioning 03/13/2017  . Normocytic anemia 03/13/2017  . Fall 03/12/2017  . Abdominal soreness 03/12/2017  . Generalized weakness 03/12/2017  . Atrial fibrillation with RVR ([place]) 03/12/2017  . Leukocytosis 03/12/2017  . Hyponatremia 03/12/2017  . Orthostatic hypotension 02/16/2017  . Blood in stool 09/19/2016  . Anticoagulated 09/19/2016  . History of prostate cancer 09/04/2016  . Persistent atrial fibrillation ([place]) 09/04/2016  . Acute congestive heart failure ([place]) 09/04/2016  . Cardiomyopathy, ischemic 07/16/2012  . CKD (chronic kidney disease) stage 2, GFR 60-89 ml/min 07/16/2012  . Essential hypertension 08/05/2011  . Murmur 08/05/2011  .  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 Dyslipidemia 02/04/2011    Past Surgical History:  Procedure Laterality Date  . CARDIAC CATHETERIZATION  04/02/2005   dr Doreatha Lew    Critical three-vessel coronary disease --  Essentially normal left ventricular function , ef 55%  . CARDIOVASCULAR STRESS TEST  05-01-2008  dr Doreatha Lew /  dr harding   Low risk nuclear study w/ no ischemia or scar but flattened septal motion/  ef 64%  . CARDIOVERSION N/A 11/19/2016   Procedure: CARDIOVERSION;  Surgeon: Jerline Pain, MD;  Location:  [facility];  Service: Cardiovascular;  Laterality: N/A;  . COLONOSCOPY N/A 10/23/2016   Procedure: COLONOSCOPY;  Surgeon: Milus Banister, MD;  Location: [facility];  Service: Endoscopy;  Laterality: N/A;  . CORONARY ARTERY BYPASS GRAFT  04/04/2005   dr  Ricard Dillon   LIMA - LAD,  SVG - Ramus, SVG - OM,  seqSVG - PLV and PDA  . CYSTOSCOPY WITH LITHOLAPAXY N/A 05/07/2016   Procedure: CYSTOSCOPY WITH LITHOLAPAXY;  Surgeon: Rana Snare, MD;  Location[facility];  Service: Urology;  Laterality: N/A;  . HOLMIUM LASER APPLICATION N/A 14/48/1856   Procedure: HOLMIUM LASER APPLICATION;  Surgeon: Rana Snare, MD;  Location: [facility];  Service: Urology;  Laterality: N/A;  . INGUINAL HERNIA REPAIR Left 03-30-2005  dr Excell Seltzer   incarcerated   . RADIOACTIVE PROSTATE SEED IMPLANTS  1997  . TRANSTHORACIC ECHOCARDIOGRAM  08/24/2014   mid anteroseptal and distal septak hypokinetic,  mild focal basal LVH, ef 60-65%,  grade 1 diastolic dysfunction/  mild MR/  mild LAE/  mild dilated RV with normal RVSP/  trivial TR        Home Medications    Prior to Admission medications   Medication Sig Start Date End Date Taking? Authorizing Provider  acetaminophen (TYLENOL) 500 MG tablet Take 500 mg by mouth every 6 (six) hours as needed (for pain).     [provider]  amiodarone (PACERONE) 200 MG tablet Take 1 tablet (200 mg total) by mouth daily. 04/01/17   Dunn, Nedra Hai, PA-C  apixaban (ELIQUIS) 5 MG TABS tablet Take 1 tablet (5 mg total) by mouth 2 (two) times daily. 04/01/17   Larey Dresser, MD  bicalutamide (CASODEX) 50 MG tablet Take 50 mg by mouth daily with breakfast.  10/22/15   [provider]  ciprofloxacin (CIPRO) 250 MG tablet Take 1 tablet (250 mg total) by mouth 2 (two) times daily. 01/01/18   [name], MD  ferrous sulfate 325 (65 FE) MG tablet TAKE 1 TABLET BY MOUTH EVERY DAY WITH BREAKFAST 01/02/18   [name], MD  omeprazole  (PRILOSEC) 20 MG capsule Take 1 capsule (20 mg total) by mouth daily. 09/15/17   [name], MD  rosuvastatin (CRESTOR) 40 MG tablet TAKE 1 TABLET BY MOUTH EVERY DAY 08/20/17   Minna Merritts, MD    Family History Family History  Problem Relation Age of Onset  . Heart disease Mother   . Cancer Mother   . Cancer Father   . Cancer Brother     Social History Social History   Tobacco Use  . Smoking status: Former Smoker    Years: 2.00    Types: Cigarettes    Last attempt to quit: 07/28/1960    Years since quitting: 57.4  . Smokeless tobacco: Never Used  Substance Use Topics  . Alcohol use: No  . Drug use: No  Allergies   Patient has no known allergies.   Review of Systems Review of Systems  Constitutional: Negative for activity change, appetite change and fever.  HENT: Negative for congestion and rhinorrhea.   Eyes: Negative for visual disturbance.  Respiratory: Negative for cough, chest tightness and shortness of breath.   Cardiovascular: Negative for chest pain and leg swelling.  Gastrointestinal: Positive for abdominal pain. Negative for nausea and vomiting.  Genitourinary: Positive for difficulty urinating, dysuria, frequency and urgency. Negative for flank pain, hematuria and testicular pain.  Musculoskeletal: Negative for back pain.  Neurological: Negative for dizziness, weakness and headaches.    all other systems are negative except as noted in the HPI and PMH.    Physical Exam Updated Vital Signs BP (!) 161/76 (BP Location: Right Arm)   Pulse 75   Temp 98 F (36.7 C) (Oral)   Resp 14   Ht 5\' 11"  (1.803 m)   Wt 73.9 kg (163 lb)   SpO2 99%   BMI 22.73 kg/m   Physical Exam  Constitutional: He is oriented to person, place, and time. He appears well-developed and well-nourished. No distress.  HENT:  Head: Normocephalic and atraumatic.  Mouth/Throat: Oropharynx is clear and moist. No oropharyngeal exudate.  Eyes: Pupils are equal, round, and  reactive to light. Conjunctivae and EOM are normal.  Neck: Normal range of motion. Neck supple.  No meningismus.  Cardiovascular: Normal rate, regular rhythm, normal heart sounds and intact distal pulses.  No murmur heard. Pulmonary/Chest: Effort normal and breath sounds normal. No respiratory distress.  Abdominal: Soft. There is tenderness. There is no rebound and no guarding.  Suprapubic tenderness  Genitourinary:  Genitourinary Comments: No testicular tenderness  Musculoskeletal: Normal range of motion. He exhibits no edema or tenderness.  No CVAT  Neurological: He is alert and oriented to person, place, and time. No cranial nerve deficit. He exhibits normal muscle tone. Coordination normal.  No ataxia on finger to nose bilaterally. No pronator drift. 5/5 strength throughout. CN 2-12 intact.Equal grip strength. Sensation intact.   Skin: Skin is warm.  Psychiatric: He has a normal mood and affect. His behavior is normal.  Nursing note and vitals reviewed.    ED Treatments / Results  Labs (all labs ordered are listed, but only abnormal results are displayed) Labs Reviewed  COMPREHENSIVE METABOLIC PANEL - Abnormal; Notable for the following components:      Result Value   Glucose, Bld 105 (*)    Creatinine, Ser 1.72 (*)    GFR calc non Af Amer 36 (*)    GFR calc Af Amer 41 (*)    All other components within normal limits  URINALYSIS, ROUTINE W REFLEX MICROSCOPIC - Abnormal; Notable for the following components:   APPearance CLOUDY (*)    Hgb urine dipstick LARGE (*)    Protein, ur 30 (*)    Leukocytes, UA LARGE (*)    RBC / HPF >50 (*)    WBC, UA >50 (*)    Bacteria, UA FEW (*)    All other components within normal limits  URINE CULTURE  CULTURE, BLOOD (ROUTINE X 2)  CULTURE, BLOOD (ROUTINE X 2)  CBC WITH DIFFERENTIAL/PLATELET  I-STAT CG4 LACTIC ACID, ED    EKG None  Radiology Dg Chest 2 View  Result Date: 01/05/2018 CLINICAL DATA:  Productive cough. EXAM: CHEST -  2 VIEW COMPARISON:  09/10/2017 FINDINGS: Post median sternotomy and CABG. Normal heart size and mediastinal contours. Lingular scarring. Slight improved aeration of the lung  bases from prior. No focal airspace disease, pulmonary edema or pleural effusion. No pneumothorax. No acute osseous abnormalities. Calcified density projecting over the left mid lung on prior exam appears to be related to the scapula. IMPRESSION: Left lung base scarring.  No acute findings. Electronically Signed   By: Jeb Levering M.D.   On: 01/05/2018 00:48   Ct Renal Stone Study  Result Date: 01/05/2018 CLINICAL DATA:  Urinary tract stone. Recent diagnosis of urinary tract infection. EXAM: CT ABDOMEN AND PELVIS WITHOUT CONTRAST TECHNIQUE: Multidetector CT imaging of the abdomen and pelvis was performed following the standard protocol without IV contrast. COMPARISON:  CT 04/04/2016 FINDINGS: Lower chest: Fine reticular opacities in the subpleural lower lobes. No confluent consolidation. No pleural effusion. Hepatobiliary: No focal hepatic lesion on noncontrast exam. Small calcified gallstones in the fundus. No pericholecystic inflammation or abnormal distention. No biliary dilatation. Pancreas: Parenchymal atrophy. No ductal dilatation or inflammation. Spleen: Calcified granuloma.  Normal in size. Adrenals/Urinary Tract: Normal adrenal glands. Chronic left renal parenchymal atrophy with upper pole calyceal diverticulum containing multiple large calculi, similar to prior exam. Least 3 adjacent stones in the distal left ureter just proximal to the ureterovesicular junction, distal most stone measuring 3 mm, 2 adjacent stones measuring 7 and 5 mm slightly more proximally. There is no ureteral dilatation. No left perinephric edema. Cysts in the left kidney appears similar to prior exam. Least 4 nonobstructing stones throughout the right kidney. No right hydronephrosis or ureteral dilatation. No right ureteral calculi. Scattered cysts again  seen in the right kidney. 6 mm stone in the right urinary trigone, stone in this region previously measured 18 x 12 mm. Urinary bladder is partially distended. No wall thickening. Stomach/Bowel: Sigmoid colonic tortuosity with mild colonic diverticulosis. No diverticulitis. Moderate colonic stool burden throughout the entire colon. No abnormal rectal distention. The appendix is not confidently visualized, no pericecal inflammation. No small bowel dilatation, inflammatory change or apparent wall thickening. Stomach distended with ingested contents. Small hiatal hernia. Vascular/Lymphatic: Aortic atherosclerosis with ectasia of the infrarenal aorta, maximal dimension 2.9 cm. Bi-iliac atherosclerosis. No enlarged abdominal or pelvic lymph nodes. Reproductive: Brachytherapy seeds in the prostate bed. Other: Prior left inguinal hernia repair. No free air, free fluid, or intra-abdominal fluid collection. Musculoskeletal: Bones are under mineralized. Chronic L1 compression fracture. Multilevel degenerative change in the spine. No acute osseous abnormalities. Few scattered bone islands in the lumbar spine and pelvis are unchanged. IMPRESSION: 1. Nonobstructing stones within both kidneys, including stones within a left upper kidney calyceal diverticulum. There are 3 adjacent stones in the distal left ureter measuring up to 7 mm, but without ureteral dilatation or perinephric edema. 2. A 6 mm stone is in the right urinary bladder. Stone in this region previously measured 18 x 12 mm, may reflect a residual stone fragment or recurrent stone. 3. Fine subpleural reticular opacities at the lung bases, nonspecific. While this may represent atelectasis, in the setting of cough, interstitial lung disease is considered. Consider nonemergent high-resolution chest CT for further characterization. 4. Ectatic atherosclerotic aorta maximal dimension 2.9 cm at risk for aneurysm development. Recommend followup by ultrasound in 5 years. This  recommendation follows ACR consensus guidelines: White Paper of the ACR Incidental Findings Committee II on Vascular Findings. J Am Coll Radiol 2013; 10:789-794. Aortic Atherosclerosis (ICD10-I70.0). 5. Mild colonic diverticulosis without diverticulitis. Gallstones without gallbladder inflammation. Electronically Signed   By: Jeb Levering M.D.   On: 01/05/2018 00:58    Procedures Procedures (including critical care time)  Medications  Ordered in ED Medications  sodium chloride 0.9 % bolus 1,000 mL (has no administration in time range)     Initial Impression / Assessment and Plan / ED Course  I have reviewed the triage vital signs and the nursing notes.  Pertinent labs & imaging results that were available during my care of the patient were reviewed by me and considered in my medical decision making (see chart for details).    Patient presents with a one-week history of UTI symptoms and frequency.  Placed on Cipro for UTI.  Culture is growing Pseudomonas with multidrug resistance.  Patient appears well and nontoxic.  Bladder scan shows 75 cc. Labs consistent with AKI, UTI.  Patient will be hydrated and started on meropenem  CT scan obtained given patient's AKI and history of kidney stones and urinary symptoms.  This shows multiple stones within the distal left ureter.  As well as bladder stone.   Discussed with Dr. Alyson Ingles of urology.  Patient appears well and nontoxic and nonseptic.  Patient will be admitted for IV antibiotics given resistance patterns to [facility] long hospital for possible urology intervention in the morning.  Patient remains comfortable and nontoxic-appearing.  Admission to [facility] hospital discussed with Dr. Hal Hope.  Patient and family updated.   Final Clinical Impressions(s) / ED Diagnoses   Final diagnoses:  Acute pyelonephritis  Ureteral stone    ED Discharge Orders    None       Jamonica Schoff, Annie Main, MD 01/05/18 [id number]

## 2018-01-05 ENCOUNTER — Inpatient Hospital Stay (HOSPITAL_COMMUNITY): Payer: Medicare Other

## 2018-01-05 ENCOUNTER — Emergency Department (HOSPITAL_COMMUNITY): Payer: Medicare Other

## 2018-01-05 ENCOUNTER — Encounter (HOSPITAL_COMMUNITY): Payer: Self-pay | Admitting: Internal Medicine

## 2018-01-05 DIAGNOSIS — Z23 Encounter for immunization: Secondary | ICD-10-CM | POA: Diagnosis present

## 2018-01-05 DIAGNOSIS — N179 Acute kidney failure, unspecified: Secondary | ICD-10-CM | POA: Diagnosis present

## 2018-01-05 DIAGNOSIS — N201 Calculus of ureter: Secondary | ICD-10-CM | POA: Diagnosis present

## 2018-01-05 DIAGNOSIS — N390 Urinary tract infection, site not specified: Secondary | ICD-10-CM | POA: Diagnosis not present

## 2018-01-05 DIAGNOSIS — I255 Ischemic cardiomyopathy: Secondary | ICD-10-CM | POA: Diagnosis present

## 2018-01-05 DIAGNOSIS — M25561 Pain in right knee: Secondary | ICD-10-CM | POA: Diagnosis present

## 2018-01-05 DIAGNOSIS — I481 Persistent atrial fibrillation: Secondary | ICD-10-CM | POA: Diagnosis present

## 2018-01-05 DIAGNOSIS — D509 Iron deficiency anemia, unspecified: Secondary | ICD-10-CM | POA: Diagnosis present

## 2018-01-05 DIAGNOSIS — N209 Urinary calculus, unspecified: Secondary | ICD-10-CM

## 2018-01-05 DIAGNOSIS — Z1624 Resistance to multiple antibiotics: Secondary | ICD-10-CM | POA: Diagnosis present

## 2018-01-05 DIAGNOSIS — M25461 Effusion, right knee: Secondary | ICD-10-CM | POA: Diagnosis present

## 2018-01-05 DIAGNOSIS — I5032 Chronic diastolic (congestive) heart failure: Secondary | ICD-10-CM | POA: Diagnosis present

## 2018-01-05 DIAGNOSIS — I2510 Atherosclerotic heart disease of native coronary artery without angina pectoris: Secondary | ICD-10-CM | POA: Diagnosis present

## 2018-01-05 DIAGNOSIS — K219 Gastro-esophageal reflux disease without esophagitis: Secondary | ICD-10-CM | POA: Diagnosis present

## 2018-01-05 DIAGNOSIS — N183 Chronic kidney disease, stage 3 (moderate): Secondary | ICD-10-CM | POA: Diagnosis present

## 2018-01-05 DIAGNOSIS — Z87442 Personal history of urinary calculi: Secondary | ICD-10-CM | POA: Diagnosis not present

## 2018-01-05 DIAGNOSIS — Z951 Presence of aortocoronary bypass graft: Secondary | ICD-10-CM | POA: Diagnosis not present

## 2018-01-05 DIAGNOSIS — N10 Acute tubulo-interstitial nephritis: Secondary | ICD-10-CM | POA: Diagnosis not present

## 2018-01-05 DIAGNOSIS — I480 Paroxysmal atrial fibrillation: Secondary | ICD-10-CM | POA: Diagnosis not present

## 2018-01-05 DIAGNOSIS — I440 Atrioventricular block, first degree: Secondary | ICD-10-CM | POA: Diagnosis present

## 2018-01-05 DIAGNOSIS — B965 Pseudomonas (aeruginosa) (mallei) (pseudomallei) as the cause of diseases classified elsewhere: Secondary | ICD-10-CM | POA: Diagnosis present

## 2018-01-05 DIAGNOSIS — C61 Malignant neoplasm of prostate: Secondary | ICD-10-CM | POA: Diagnosis present

## 2018-01-05 DIAGNOSIS — I10 Essential (primary) hypertension: Secondary | ICD-10-CM

## 2018-01-05 DIAGNOSIS — I130 Hypertensive heart and chronic kidney disease with heart failure and stage 1 through stage 4 chronic kidney disease, or unspecified chronic kidney disease: Secondary | ICD-10-CM | POA: Diagnosis present

## 2018-01-05 DIAGNOSIS — M11261 Other chondrocalcinosis, right knee: Secondary | ICD-10-CM | POA: Diagnosis present

## 2018-01-05 DIAGNOSIS — I340 Nonrheumatic mitral (valve) insufficiency: Secondary | ICD-10-CM | POA: Diagnosis present

## 2018-01-05 DIAGNOSIS — N136 Pyonephrosis: Secondary | ICD-10-CM | POA: Diagnosis present

## 2018-01-05 DIAGNOSIS — E785 Hyperlipidemia, unspecified: Secondary | ICD-10-CM | POA: Diagnosis present

## 2018-01-05 DIAGNOSIS — N3020 Other chronic cystitis without hematuria: Secondary | ICD-10-CM | POA: Diagnosis present

## 2018-01-05 LAB — BASIC METABOLIC PANEL WITH GFR
Anion gap: 6 (ref 5–15)
BUN: 22 mg/dL — ABNORMAL HIGH (ref 6–20)
CO2: 24 mmol/L (ref 22–32)
Calcium: 8.6 mg/dL — ABNORMAL LOW (ref 8.9–10.3)
Chloride: 109 mmol/L (ref 101–111)
Creatinine, Ser: 1.42 mg/dL — ABNORMAL HIGH (ref 0.61–1.24)
GFR calc Af Amer: 52 mL/min — ABNORMAL LOW (ref >60)
GFR calc non Af Amer: 45 mL/min — ABNORMAL LOW (ref >60)
Glucose, Bld: 105 mg/dL — ABNORMAL HIGH (ref 65–99)
Potassium: 4.6 mmol/L (ref 3.5–5.1)
Sodium: 139 mmol/L (ref 135–145)

## 2018-01-05 MED ORDER — PANTOPRAZOLE SODIUM 40 MG PO TBEC
40.0000 mg | DELAYED_RELEASE_TABLET | Freq: Every day | ORAL | Status: DC
  Administered 2018-01-05 – 2018-01-06 (×2): 40 mg via ORAL
  Filled 2018-01-05 (×2): qty 1

## 2018-01-05 MED ORDER — BICALUTAMIDE 50 MG PO TABS
50.0000 mg | ORAL_TABLET | Freq: Every day | ORAL | Status: DC
  Administered 2018-01-05 – 2018-01-06 (×2): 50 mg via ORAL
  Filled 2018-01-05 (×2): qty 1

## 2018-01-05 MED ORDER — APIXABAN 5 MG PO TABS
5.0000 mg | ORAL_TABLET | Freq: Two times a day (BID) | ORAL | Status: DC
  Administered 2018-01-05 – 2018-01-06 (×3): 5 mg via ORAL
  Filled 2018-01-05 (×3): qty 1

## 2018-01-05 MED ORDER — SODIUM CHLORIDE 0.9 % IV SOLN
1.0000 g | Freq: Once | INTRAVENOUS | Status: AC
  Administered 2018-01-05: 1 g via INTRAVENOUS
  Filled 2018-01-05: qty 1

## 2018-01-05 MED ORDER — ACETAMINOPHEN 650 MG RE SUPP: 650.0000 mg | Freq: Four times a day (QID) | RECTAL | Status: DC | PRN

## 2018-01-05 MED ORDER — ACETAMINOPHEN 325 MG PO TABS
650.0000 mg | ORAL_TABLET | Freq: Four times a day (QID) | ORAL | Status: DC | PRN
  Administered 2018-01-05 – 2018-01-06 (×2): 650 mg via ORAL
  Filled 2018-01-05 (×2): qty 2

## 2018-01-05 MED ORDER — PNEUMOCOCCAL VAC POLYVALENT 25 MCG/0.5ML IJ INJ
0.5000 mL | INJECTION | INTRAMUSCULAR | Status: AC
  Administered 2018-01-06: 0.5 mL via INTRAMUSCULAR
  Filled 2018-01-05 (×2): qty 0.5

## 2018-01-05 MED ORDER — TRAMADOL HCL 50 MG PO TABS
50.0000 mg | ORAL_TABLET | Freq: Four times a day (QID) | ORAL | Status: DC | PRN
  Administered 2018-01-05 – 2018-01-06 (×3): 50 mg via ORAL
  Filled 2018-01-05 (×3): qty 1

## 2018-01-05 MED ORDER — SODIUM CHLORIDE 0.9 % IV SOLN
1.0000 g | Freq: Two times a day (BID) | INTRAVENOUS | Status: DC
  Administered 2018-01-05 – 2018-01-06 (×3): 1 g via INTRAVENOUS
  Filled 2018-01-05 (×5): qty 1

## 2018-01-05 MED ORDER — ROSUVASTATIN CALCIUM 20 MG PO TABS
40.0000 mg | ORAL_TABLET | Freq: Every day | ORAL | Status: DC
  Administered 2018-01-05 – 2018-01-06 (×2): 40 mg via ORAL
  Filled 2018-01-05 (×2): qty 2
  Filled 2018-01-05: qty 1

## 2018-01-05 MED ORDER — MORPHINE SULFATE (PF) 2 MG/ML IV SOLN
2.0000 mg | INTRAVENOUS | Status: DC | PRN
  Administered 2018-01-05 – 2018-01-06 (×4): 2 mg via INTRAVENOUS
  Filled 2018-01-05 (×4): qty 1

## 2018-01-05 MED ORDER — AMIODARONE HCL 200 MG PO TABS
200.0000 mg | ORAL_TABLET | Freq: Every day | ORAL | Status: DC
  Administered 2018-01-05 – 2018-01-06 (×2): 200 mg via ORAL
  Filled 2018-01-05 (×2): qty 1

## 2018-01-05 MED ORDER — ONDANSETRON HCL 4 MG/2ML IJ SOLN: 4.0000 mg | Freq: Four times a day (QID) | INTRAMUSCULAR | Status: DC | PRN

## 2018-01-05 MED ORDER — ONDANSETRON HCL 4 MG PO TABS: 4.0000 mg | ORAL_TABLET | Freq: Four times a day (QID) | ORAL | Status: DC | PRN

## 2018-01-05 NOTE — Consult Note (Signed)
Urology Consult  Referring physician: Dr. Wyvonnia Dusky Reason for referral: left ureteral calculi  Chief Complaint: fatigue  History of Present Illness: Carl [name] is an 82yo with a hx of CKD, CAD, chronic cystitis who was admitted with a UTI resistant to oral antibiotics. He denies any flank pain, hematuria, or worsening LUTS. CT abd/pelvis showed bladder calculi and multiple left distal ureteral without hydronephrosis or perinephric edema. No fevers/chills/sweats  Past Medical History:  Diagnosis Date  . Anemia   . Arthritis    arms  . Bladder calculus   . Chronic anxiety   . CKD (chronic kidney disease), stage III ([place])   . Coronary artery disease CARDIOLOGIST-  DR Loralie Champagne   a. CABG 2006 with  LIMA-LAD, SVG-Ramus, SVG-OM, seq SVG-PLV-PDA.  [name] GERD (gastroesophageal reflux disease)   . History of prostate cancer urologist-  dr Risa Grill   hx radioactive prostate seed implants 1997--  currently on Lupron injection  . Hyperlipidemia   . Hyperplasia of prostate without lower urinary tract symptoms (LUTS)   . Hypertension   . Ischemic cardiomyopathy    echo (01/13) 45-50% septal akinesis, mild Carl/  echo  (01/16) ef 60-65%, mid-apical anteroseptal hypokinesis  . Lung nodule   . Moderate mitral regurgitation   . Orthostatic hypotension   . PAC (premature atrial contraction)   . PAF (paroxysmal atrial fibrillation) ([place])    a. prior DCCV 10/2016.  . Paroxysmal atrial flutter ([place])    a. dx 02/2017, started on amiodarone.  . S/P CABG x 5 04/04/2005   LIMA to LAD,  SVG to Ramus,  SVG to OM,  seqSVG to PLV and PDA  . Wears glasses    Past Surgical History:  Procedure Laterality Date  . CARDIAC CATHETERIZATION  04/02/2005   dr Doreatha Lew    Critical three-vessel coronary disease --  Essentially normal left ventricular function , ef 55%  . CARDIOVASCULAR STRESS TEST  05-01-2008  dr Doreatha Lew /  dr harding   Low risk nuclear study w/ no ischemia or scar but flattened septal motion/  ef 64%  .  CARDIOVERSION N/A 11/19/2016   Procedure: CARDIOVERSION;  Surgeon: Jerline Pain, MD;  Location: [facility];  Service: Cardiovascular;  Laterality: N/A;  . COLONOSCOPY N/A 10/23/2016   Procedure: COLONOSCOPY;  Surgeon: Milus Banister, MD;  Location: [facility];  Service: Endoscopy;  Laterality: N/A;  . CORONARY ARTERY BYPASS GRAFT  04/04/2005   dr  Ricard Dillon   LIMA - LAD,  SVG - Ramus, SVG - OM,  seqSVG - PLV and PDA  . CYSTOSCOPY WITH LITHOLAPAXY N/A 05/07/2016   Procedure: CYSTOSCOPY WITH LITHOLAPAXY;  Surgeon: Rana Snare, MD;  Location: [facility];  Service: Urology;  Laterality: N/A;  . HOLMIUM LASER APPLICATION N/A 01/25/1600   Procedure: HOLMIUM LASER APPLICATION;  Surgeon: Rana Snare, MD;  Location: [facility];  Service: Urology;  Laterality: N/A;  . INGUINAL HERNIA REPAIR Left 03-30-2005  dr Excell Seltzer   incarcerated   . RADIOACTIVE PROSTATE SEED IMPLANTS  1997  . TRANSTHORACIC ECHOCARDIOGRAM  08/24/2014   mid anteroseptal and distal septak hypokinetic,  mild focal basal LVH, ef 60-65%,  grade 1 diastolic dysfunction/  mild Carl/  mild LAE/  mild dilated RV with normal RVSP/  trivial TR    Medications: I have reviewed the patient's current medications. Allergies: No Known Allergies  Family History  Problem Relation Age of Onset  . Heart disease Mother   . Cancer Mother   . Cancer Father   .  Cancer Brother    Social History:  reports that he quit smoking about 57 years ago. His smoking use included cigarettes. He quit after 2.00 years of use. He has never used smokeless tobacco. He reports that he does not drink alcohol or use drugs.  Review of Systems  Genitourinary: Positive for urgency.  All other systems reviewed and are negative.   Physical Exam:  Vital signs in last 24 hours: Temp:  [97.7 F (36.5 C)-98.3 F (36.8 C)] 97.7 F (36.5 C) (06/11 0424) Pulse Rate:  [70-78] 72 (06/11 0424) Resp:  [14-18] 18 (06/11 0424) BP:  (129-161)/(75-86) 138/75 (06/11 0424) SpO2:  [99 %-100 %] 99 % (06/11 0424) Weight:  [73.9 kg (163 lb)-75.9 kg (167 lb 4.8 oz)] 75.9 kg (167 lb 4.8 oz) (06/11 0415) Physical Exam  Constitutional: He is oriented to person, place, and time. He appears well-developed and well-nourished.  HENT:  Head: Normocephalic and atraumatic.  Eyes: Pupils are equal, round, and reactive to light. EOM are normal.  Neck: Normal range of motion. No thyromegaly present.  Cardiovascular: Normal rate and regular rhythm.  Respiratory: Effort normal. No respiratory distress.  GI: Soft. He exhibits no distension. There is no tenderness. Hernia confirmed negative in the right inguinal area and confirmed negative in the left inguinal area.  Genitourinary: Testes normal and penis normal.  Musculoskeletal: Normal range of motion. He exhibits no edema.  Lymphadenopathy:       Right: No inguinal adenopathy present.       Left: No inguinal adenopathy present.  Neurological: He is alert and oriented to person, place, and time.  Skin: Skin is warm and dry.  Psychiatric: He has a normal mood and affect. His behavior is normal. Judgment and thought content normal.    Laboratory Data:  Results for orders placed or performed during the hospital encounter of 01/04/18 (from the past 72 hour(s))  Comprehensive metabolic panel     Status: Abnormal   Collection Time: 01/04/18  5:35 PM  Result Value Ref Range   Sodium 141 135 - 145 mmol/L   Potassium 4.5 3.5 - 5.1 mmol/L    Comment: SLIGHT HEMOLYSIS   Chloride 108 101 - 111 mmol/L   CO2 24 22 - 32 mmol/L   Glucose, Bld 105 (H) 65 - 99 mg/dL   BUN 20 6 - 20 mg/dL   Creatinine, Ser 1.72 (H) 0.61 - 1.24 mg/dL   Calcium 9.2 8.9 - 10.3 mg/dL   Total Protein 6.6 6.5 - 8.1 g/dL   Albumin 3.7 3.5 - 5.0 g/dL   AST 19 15 - 41 U/L   ALT 21 17 - 63 U/L   Alkaline Phosphatase 92 38 - 126 U/L   Total Bilirubin 0.4 0.3 - 1.2 mg/dL   GFR calc non Af Amer 36 (L) >60 mL/min   GFR calc  Af Amer 41 (L) >60 mL/min    Comment: (NOTE) The eGFR has been calculated using the CKD EPI equation. This calculation has not been validated in all clinical situations. eGFR's persistently <60 mL/min signify possible Chronic Kidney Disease.    Anion gap 9 5 - 15    Comment: Performed at [facility] [address] [place], [place] [address]  CBC with Differential     Status: None   Collection Time: 01/04/18  5:35 PM  Result Value Ref Range   WBC 7.0 4.0 - 10.5 K/uL   RBC 4.28 4.22 - 5.81 MIL/uL   Hemoglobin 13.2 13.0 - 17.0  g/dL   HCT 42.2 39.0 - 52.0 %   MCV 98.6 78.0 - 100.0 fL   MCH 30.8 26.0 - 34.0 pg   MCHC 31.3 30.0 - 36.0 g/dL   RDW 13.6 11.5 - 15.5 %   Platelets 213 150 - 400 K/uL   Neutrophils Relative % 76 %   Neutro Abs 5.3 1.7 - 7.7 K/uL   Lymphocytes Relative 16 %   Lymphs Abs 1.1 0.7 - 4.0 K/uL   Monocytes Relative 8 %   Monocytes Absolute 0.5 0.1 - 1.0 K/uL   Eosinophils Relative 0 %   Eosinophils Absolute 0.0 0.0 - 0.7 K/uL   Basophils Relative 0 %   Basophils Absolute 0.0 0.0 - 0.1 K/uL   Immature Granulocytes 0 %   Abs Immature Granulocytes 0.0 0.0 - 0.1 K/uL    Comment: Performed at [facility] [address] [place], [place] [address]  Urinalysis, Routine w reflex microscopic     Status: Abnormal   Collection Time: 01/04/18  5:45 PM  Result Value Ref Range   Color, Urine YELLOW YELLOW   APPearance CLOUDY (A) CLEAR   Specific Gravity, Urine 1.018 1.005 - 1.030   pH 5.0 5.0 - 8.0   Glucose, UA NEGATIVE NEGATIVE mg/dL   Hgb urine dipstick LARGE (A) NEGATIVE   Bilirubin Urine NEGATIVE NEGATIVE   Ketones, ur NEGATIVE NEGATIVE mg/dL   Protein, ur 30 (A) NEGATIVE mg/dL   Nitrite NEGATIVE NEGATIVE   Leukocytes, UA LARGE (A) NEGATIVE   RBC / HPF >50 (H) 0 - 5 RBC/hpf   WBC, UA >50 (H) 0 - 5 WBC/hpf   Bacteria, UA FEW (A) NONE SEEN   Ca Oxalate Crys, UA PRESENT     Comment: Performed at [facility] [address]  [place], [place] [address]  I-Stat CG4 Lactic Acid, ED     Status: None   Collection Time: 01/04/18  5:57 PM  Result Value Ref Range   Lactic Acid, Venous 1.20 0.5 - 1.9 mmol/L  Basic metabolic panel     Status: Abnormal   Collection Time: 01/05/18  8:31 AM  Result Value Ref Range   Sodium 139 135 - 145 mmol/L   Potassium 4.6 3.5 - 5.1 mmol/L   Chloride 109 101 - 111 mmol/L   CO2 24 22 - 32 mmol/L   Glucose, Bld 105 (H) 65 - 99 mg/dL   BUN 22 (H) 6 - 20 mg/dL   Creatinine, Ser 1.42 (H) 0.61 - 1.24 mg/dL   Calcium 8.6 (L) 8.9 - 10.3 mg/dL   GFR calc non Af Amer 45 (L) >60 mL/min   GFR calc Af Amer 52 (L) >60 mL/min    Comment: (NOTE) The eGFR has been calculated using the CKD EPI equation. This calculation has not been validated in all clinical situations. eGFR's persistently <60 mL/min signify possible Chronic Kidney Disease.    Anion gap 6 5 - 15    Comment: Performed at [facility], [place] [address], [place], [place] [address]   Recent Results (from the past 240 hour(s))  Urine Culture     Status: Abnormal   Collection Time: 01/01/18 12:39 PM  Result Value Ref Range Status   MICRO NUMBER: [id number]  Final   SPECIMEN QUALITY: ADEQUATE  Final   Sample Source NOT GIVEN  Final   STATUS: FINAL  Final   ISOLATE 1: Pseudomonas aeruginosa (A)  Final    Comment: 50,000-100,000 CFU/mL of Pseudomonas aeruginosa  ISOLATE 2: Pseudomonas aeruginosa (A)  Final    Comment: 50,000-100,000 CFU/mL of Pseudomonas aeruginosa      Susceptibility   Pseudomonas aeruginosa - URINE CULTURE, REFLEX    CEFTAZIDIME  Intermediate     CEFEPIME 32 Resistant     CIPROFLOXACIN 2 Intermediate     LEVOFLOXACIN 4 Intermediate     GENTAMICIN <=1 Sensitive     IMIPENEM <=0.25 Sensitive     PIP/TAZO 16 Intermediate     TOBRAMYCIN <=1 Sensitive    Pseudomonas aeruginosa - URINE CULTURE NEGATIVE 2    CEFTAZIDIME 4 Sensitive     CEFEPIME 4 Sensitive     CIPROFLOXACIN <=0.25 Sensitive      LEVOFLOXACIN 1 Sensitive     GENTAMICIN 2 Sensitive     IMIPENEM 2 Sensitive     PIP/TAZO 8 Sensitive     TOBRAMYCIN* <=1 Sensitive      * Legend:S = Susceptible  I = IntermediateR = Resistant  NS = Not susceptible* = Not tested  NR = Not reported**NN = See antimicrobic comments   Creatinine: Recent Labs    01/01/18 1137 01/04/18 1735 01/05/18 0831  CREATININE 1.39 1.72* 1.42*   Baseline Creatinine: 1.3  Impression/Assessment:  82yo with left ureteral calculi and UTI  Plan:  1. Left ureteral calculi: I discussed the various treatment options with the patient including medical expulsive therapy, ureteroscopy, stent placement and nephrostomy tube placement with PCNL. Since the patient is afrebrile, WBC count is normal and he is asymptomatic from his ureteral calculi we have elected to proceed with medical expulsive therapy. We will start flomax 0.54m daily  PNicolette Bang6/05/2018, 2:01 PM

## 2018-01-05 NOTE — Plan of Care (Signed)
Pt comfortable  

## 2018-01-05 NOTE — Progress Notes (Addendum)
[facility] PROGRESS NOTE    Progress Note  Carl [name]  ENI:[id number] DOB: 07-Mar-1936 DOA: 01/04/2018 PCP: [name], MD     Brief Narrative:   Carl [name] is an 82 y.o. male past medical history of atrial fibrillation on apixaban kidney disease stage III, bladder cancer experiencing urinary frequency and suprapubic pain for several days was found to be in the ED and acute renal failure with a CT scan showing left distal ureteral stones transferred to [facility] long water on IV meropenem Place n.p.o. apixaban held for possible stent placement  Assessment/Plan:   Complicated UTI (urinary tract infection) with left urolithiasis: Started empirically on IV meropenem due history of Pseudomonas urine culture on 01/01/2018. transferred to [facility] long for possible stent by urology. Dr. Alyson Ingles has evaluated the patient the patient expressed that stone had passed he recommended no further intervention and to continue antibiotic regimen.  Chronic atrial fibrillation: On amiodarone, resume apixaban.  Acute on chronic renal disease possibly obstructive uropathy: Acute kidney injury is improving, likely postrenal.  Repeat a basic metabolic panel in the morning.  Hx of CABG x 5 '06 Denies any chest pain is symptomatic continue Crestor.  Chronic diastolic heart failure: Seems to be compensated.  Essential hypertension Continue to monitor on no antihypertensive medication.  DVT prophylaxis: Eluquis Family Communication:daughter Disposition Plan/Barrier to D/C: home in 2-3 days Code Status:     Code Status Orders  (From admission, onward)        Start     Ordered   01/05/18 0309  Full code  Continuous     01/05/18 0308    Code Status History    Date Active Date Inactive Code Status Order ID Comments User Context   03/12/2017 1628 03/17/2017 2043 Full Code [id number]  Murlean Iba, MD Inpatient    Advance Directive Documentation     Most Recent Value    Type of Advance Directive  Living will  Pre-existing out of facility DNR order (yellow form or pink MOST form)  -  "MOST" Form in Place?  -        IV Access:    Peripheral IV   Procedures and diagnostic studies:   Dg Chest 2 View  Result Date: 01/05/2018 CLINICAL DATA:  Productive cough. EXAM: CHEST - 2 VIEW COMPARISON:  09/10/2017 FINDINGS: Post median sternotomy and CABG. Normal heart size and mediastinal contours. Lingular scarring. Slight improved aeration of the lung bases from prior. No focal airspace disease, pulmonary edema or pleural effusion. No pneumothorax. No acute osseous abnormalities. Calcified density projecting over the left mid lung on prior exam appears to be related to the scapula. IMPRESSION: Left lung base scarring.  No acute findings. Electronically Signed   By: Jeb Levering M.D.   On: 01/05/2018 00:48   Ct Renal Stone Study  Result Date: 01/05/2018 CLINICAL DATA:  Urinary tract stone. Recent diagnosis of urinary tract infection. EXAM: CT ABDOMEN AND PELVIS WITHOUT CONTRAST TECHNIQUE: Multidetector CT imaging of the abdomen and pelvis was performed following the standard protocol without IV contrast. COMPARISON:  CT 04/04/2016 FINDINGS: Lower chest: Fine reticular opacities in the subpleural lower lobes. No confluent consolidation. No pleural effusion. Hepatobiliary: No focal hepatic lesion on noncontrast exam. Small calcified gallstones in the fundus. No pericholecystic inflammation or abnormal distention. No biliary dilatation. Pancreas: Parenchymal atrophy. No ductal dilatation or inflammation. Spleen: Calcified granuloma.  Normal in size. Adrenals/Urinary Tract: Normal adrenal glands. Chronic left renal parenchymal atrophy with upper pole calyceal  diverticulum containing multiple large calculi, similar to prior exam. Least 3 adjacent stones in the distal left ureter just proximal to the ureterovesicular junction, distal most stone measuring 3 mm, 2 adjacent  stones measuring 7 and 5 mm slightly more proximally. There is no ureteral dilatation. No left perinephric edema. Cysts in the left kidney appears similar to prior exam. Least 4 nonobstructing stones throughout the right kidney. No right hydronephrosis or ureteral dilatation. No right ureteral calculi. Scattered cysts again seen in the right kidney. 6 mm stone in the right urinary trigone, stone in this region previously measured 18 x 12 mm. Urinary bladder is partially distended. No wall thickening. Stomach/Bowel: Sigmoid colonic tortuosity with mild colonic diverticulosis. No diverticulitis. Moderate colonic stool burden throughout the entire colon. No abnormal rectal distention. The appendix is not confidently visualized, no pericecal inflammation. No small bowel dilatation, inflammatory change or apparent wall thickening. Stomach distended with ingested contents. Small hiatal hernia. Vascular/Lymphatic: Aortic atherosclerosis with ectasia of the infrarenal aorta, maximal dimension 2.9 cm. Bi-iliac atherosclerosis. No enlarged abdominal or pelvic lymph nodes. Reproductive: Brachytherapy seeds in the prostate bed. Other: Prior left inguinal hernia repair. No free air, free fluid, or intra-abdominal fluid collection. Musculoskeletal: Bones are under mineralized. Chronic L1 compression fracture. Multilevel degenerative change in the spine. No acute osseous abnormalities. Few scattered bone islands in the lumbar spine and pelvis are unchanged. IMPRESSION: 1. Nonobstructing stones within both kidneys, including stones within a left upper kidney calyceal diverticulum. There are 3 adjacent stones in the distal left ureter measuring up to 7 mm, but without ureteral dilatation or perinephric edema. 2. A 6 mm stone is in the right urinary bladder. Stone in this region previously measured 18 x 12 mm, may reflect a residual stone fragment or recurrent stone. 3. Fine subpleural reticular opacities at the lung bases,  nonspecific. While this may represent atelectasis, in the setting of cough, interstitial lung disease is considered. Consider nonemergent high-resolution chest CT for further characterization. 4. Ectatic atherosclerotic aorta maximal dimension 2.9 cm at risk for aneurysm development. Recommend followup by ultrasound in 5 years. This recommendation follows ACR consensus guidelines: White Paper of the ACR Incidental Findings Committee II on Vascular Findings. J Am Coll Radiol 2013; 10:789-794. Aortic Atherosclerosis (ICD10-I70.0). 5. Mild colonic diverticulosis without diverticulitis. Gallstones without gallbladder inflammation. Electronically Signed   By: Jeb Levering M.D.   On: 01/05/2018 00:58     Medical Consultants:    None.  Anti-Infectives:   IV meropenem  Subjective:    Carl [name] relates his pain is improved feels much better compared to yesterday.  Objective:    Vitals:   01/05/18 0338 01/05/18 0415 01/05/18 0421 01/05/18 0424  BP:    138/75  Pulse:   71 72  Resp:   18 18  Temp: 98.1 F (36.7 C)  97.7 F (36.5 C) 97.7 F (36.5 C)  TempSrc:      SpO2:   99% 99%  Weight:  75.9 kg (167 lb 4.8 oz)    Height:  5\' 11"  (1.803 m)      Intake/Output Summary (Last 24 hours) at 01/05/2018 0741 Last data filed at 01/05/2018 0700 Gross per 24 hour  Intake 1100 ml  Output 500 ml  Net 600 ml   Filed Weights   01/04/18 1725 01/05/18 0415  Weight: 73.9 kg (163 lb) 75.9 kg (167 lb 4.8 oz)    Exam: General exam: In no acute distress. Respiratory system: Good air movement and clear to auscultation.  Cardiovascular system: S1 & S2 heard, RRR.  Gastrointestinal system: Abdomen is nondistended, soft and nontender.  Central nervous system: Alert and oriented. No focal neurological deficits. Extremities: No pedal edema. Skin: No rashes, lesions or ulcers Psychiatry: Judgement and insight appear normal. Mood & affect appropriate.    Data Reviewed:    Labs: Basic  Metabolic Panel: Recent Labs  Lab 01/01/18 1137 01/04/18 1735  NA 138 141  K 4.3 4.5  CL 105 108  CO2 24 24  GLUCOSE 103* 105*  BUN 21 20  CREATININE 1.39 1.72*  CALCIUM 9.2 9.2   GFR Estimated Creatinine Clearance: 36.5 mL/min (A) (by C-G formula based on SCr of 1.72 mg/dL (H)). Liver Function Tests: Recent Labs  Lab 01/04/18 1735  AST 19  ALT 21  ALKPHOS 92  BILITOT 0.4  PROT 6.6  ALBUMIN 3.7   No results for input(s): LIPASE, AMYLASE in the last 168 hours. No results for input(s): AMMONIA in the last 168 hours. Coagulation profile No results for input(s): INR, PROTIME in the last 168 hours.  CBC: Recent Labs  Lab 01/01/18 1137 01/04/18 1735  WBC 6.1 7.0  NEUTROABS  --  5.3  HGB 13.0 13.2  HCT 39.4 42.2  MCV 96.9 98.6  PLT 196.0 213   Cardiac Enzymes: No results for input(s): CKTOTAL, CKMB, CKMBINDEX, TROPONINI in the last 168 hours. BNP (last 3 results) No results for input(s): PROBNP in the last 8760 hours. CBG: No results for input(s): GLUCAP in the last 168 hours. D-Dimer: No results for input(s): DDIMER in the last 72 hours. Hgb A1c: No results for input(s): HGBA1C in the last 72 hours. Lipid Profile: No results for input(s): CHOL, HDL, LDLCALC, TRIG, CHOLHDL, LDLDIRECT in the last 72 hours. Thyroid function studies: No results for input(s): TSH, T4TOTAL, T3FREE, THYROIDAB in the last 72 hours.  Invalid input(s): FREET3 Anemia work up: No results for input(s): VITAMINB12, FOLATE, FERRITIN, TIBC, IRON, RETICCTPCT in the last 72 hours. Sepsis Labs: Recent Labs  Lab 01/01/18 1137 01/04/18 1735 01/04/18 1757  WBC 6.1 7.0  --   LATICACIDVEN  --   --  1.20   Microbiology Recent Results (from the past 240 hour(s))  Urine Culture     Status: Abnormal   Collection Time: 01/01/18 12:39 PM  Result Value Ref Range Status   MICRO NUMBER: [id number]  Final   SPECIMEN QUALITY: ADEQUATE  Final   Sample Source NOT GIVEN  Final   STATUS: FINAL  Final     ISOLATE 1: Pseudomonas aeruginosa (A)  Final    Comment: 50,000-100,000 CFU/mL of Pseudomonas aeruginosa   ISOLATE 2: Pseudomonas aeruginosa (A)  Final    Comment: 50,000-100,000 CFU/mL of Pseudomonas aeruginosa      Susceptibility   Pseudomonas aeruginosa - URINE CULTURE, REFLEX    CEFTAZIDIME  Intermediate     CEFEPIME 32 Resistant     CIPROFLOXACIN 2 Intermediate     LEVOFLOXACIN 4 Intermediate     GENTAMICIN <=1 Sensitive     IMIPENEM <=0.25 Sensitive     PIP/TAZO 16 Intermediate     TOBRAMYCIN <=1 Sensitive    Pseudomonas aeruginosa - URINE CULTURE NEGATIVE 2    CEFTAZIDIME 4 Sensitive     CEFEPIME 4 Sensitive     CIPROFLOXACIN <=0.25 Sensitive     LEVOFLOXACIN 1 Sensitive     GENTAMICIN 2 Sensitive     IMIPENEM 2 Sensitive     PIP/TAZO 8 Sensitive     TOBRAMYCIN* <=1 Sensitive      *  Legend:S = Susceptible  I = IntermediateR = Resistant  NS = Not susceptible* = Not tested  NR = Not reported**NN = See antimicrobic comments     Medications:   . amiodarone  200 mg Oral Daily  . bicalutamide  50 mg Oral Q breakfast  . pantoprazole  40 mg Oral Daily  . [START ON 01/06/2018] pneumococcal 23 valent vaccine  0.5 mL Intramuscular Tomorrow-1000  . rosuvastatin  40 mg Oral Daily   Continuous Infusions: . meropenem (MERREM) IV       LOS: 0 days   [facility] Pager [phone]  *Please refer to [email or web address], password [email or web address] to get updated schedule on who will round on this patient, as hospitalists switch teams weekly. If 7PM-7AM, please contact night-coverage at [email or web address], password [email or web address] for any overnight needs.  01/05/2018, 7:41 AM

## 2018-01-05 NOTE — H&P (Signed)
History and Physical    Carl [name] PIR:[id number] DOB: 04/10/1936 DOA: 01/04/2018  PCP: [name], MD  Patient coming from: Home.  Chief Complaint: Urinary tract infection.  HPI: Carl [name] is a 82 y.o. male with history of atrial fibrillation, CAD, chronic kidney disease, hypertension, bladder cancer has been experiencing urinary frequency with suprapubic pain for the last few days and had gone to his PCP on June 7 4 days ago and had been empirically placed on Cipro and urine culture shows multidrug resistant Pseudomonas aeruginosa and was instructed to come to the ER.  Patient denies any fever chill nausea vomiting diarrhea chest pain or shortness of breath s.  Still has suprapubic pain.   ED Course: Labs in the ER reveal acute on chronic renal failure.  Patient is afebrile.  CT scan done for renal stone study shows left distal ureteral stone.  Dr. Alyson Ingles was consulted patient admitted for further management of complicated UTI with distal ureteral obstruction.  Patient's last dose of apixaban was yesterday morning.  Foley  Review of Systems: As per HPI, rest all negative.   Past Medical History:  Diagnosis Date  . Anemia   . Arthritis    arms  . Bladder calculus   . Chronic anxiety   . CKD (chronic kidney disease), stage III ([place])   . Coronary artery disease CARDIOLOGIST-  DR Loralie Champagne   a. CABG 2006 with  LIMA-LAD, SVG-Ramus, SVG-OM, seq SVG-PLV-PDA.  [name] GERD (gastroesophageal reflux disease)   . History of prostate cancer urologist-  dr Risa Grill   hx radioactive prostate seed implants 1997--  currently on Lupron injection  . Hyperlipidemia   . Hyperplasia of prostate without lower urinary tract symptoms (LUTS)   . Hypertension   . Ischemic cardiomyopathy    echo (01/13) 45-50% septal akinesis, mild MR/  echo  (01/16) ef 60-65%, mid-apical anteroseptal hypokinesis  . Lung nodule   . Moderate mitral regurgitation   . Orthostatic hypotension   . PAC  (premature atrial contraction)   . PAF (paroxysmal atrial fibrillation) ([place])    a. prior DCCV 10/2016.  . Paroxysmal atrial flutter ([place])    a. dx 02/2017, started on amiodarone.  . S/P CABG x 5 04/04/2005   LIMA to LAD,  SVG to Ramus,  SVG to OM,  seqSVG to PLV and PDA  . Wears glasses     Past Surgical History:  Procedure Laterality Date  . CARDIAC CATHETERIZATION  04/02/2005   dr Doreatha Lew    Critical three-vessel coronary disease --  Essentially normal left ventricular function , ef 55%  . CARDIOVASCULAR STRESS TEST  05-01-2008  dr Doreatha Lew /  dr harding   Low risk nuclear study w/ no ischemia or scar but flattened septal motion/  ef 64%  . CARDIOVERSION N/A 11/19/2016   Procedure: CARDIOVERSION;  Surgeon: Jerline Pain, MD;  Location: [facility];  Service: Cardiovascular;  Laterality: N/A;  . COLONOSCOPY N/A 10/23/2016   Procedure: COLONOSCOPY;  Surgeon: Milus Banister, MD;  Location: [facility];  Service: Endoscopy;  Laterality: N/A;  . CORONARY ARTERY BYPASS GRAFT  04/04/2005   dr  Ricard Dillon   LIMA - LAD,  SVG - Ramus, SVG - OM,  seqSVG - PLV and PDA  . CYSTOSCOPY WITH LITHOLAPAXY N/A 05/07/2016   Procedure: CYSTOSCOPY WITH LITHOLAPAXY;  Surgeon: Rana Snare, MD;  Location: [facility];  Service: Urology;  Laterality: N/A;  . HOLMIUM LASER APPLICATION N/A 63/07/6008   Procedure:  HOLMIUM LASER APPLICATION;  Surgeon: Rana Snare, MD;  Location: [facility];  Service: Urology;  Laterality: N/A;  . INGUINAL HERNIA REPAIR Left 03-30-2005  dr Excell Seltzer   incarcerated   . RADIOACTIVE PROSTATE SEED IMPLANTS  1997  . TRANSTHORACIC ECHOCARDIOGRAM  08/24/2014   mid anteroseptal and distal septak hypokinetic,  mild focal basal LVH, ef 60-65%,  grade 1 diastolic dysfunction/  mild MR/  mild LAE/  mild dilated RV with normal RVSP/  trivial TR     reports that he quit smoking about 57 years ago. His smoking use included cigarettes. He quit after 2.00 years of use.  He has never used smokeless tobacco. He reports that he does not drink alcohol or use drugs.  No Known Allergies  Family History  Problem Relation Age of Onset  . Heart disease Mother   . Cancer Mother   . Cancer Father   . Cancer Brother     Prior to Admission medications   Medication Sig Start Date End Date Taking? Authorizing Provider  acetaminophen (TYLENOL) 500 MG tablet Take 500 mg by mouth every 6 (six) hours as needed (for pain).    Yes [provider]  amiodarone (PACERONE) 200 MG tablet Take 1 tablet (200 mg total) by mouth daily. 04/01/17  Yes Dunn, Dayna N, PA-C  apixaban (ELIQUIS) 5 MG TABS tablet Take 1 tablet (5 mg total) by mouth 2 (two) times daily. 04/01/17  Yes Larey Dresser, MD  bicalutamide (CASODEX) 50 MG tablet Take 50 mg by mouth daily with breakfast.  10/22/15  Yes [provider]  ciprofloxacin (CIPRO) 250 MG tablet Take 1 tablet (250 mg total) by mouth 2 (two) times daily. 01/01/18  Yes [name], MD  omeprazole (PRILOSEC) 20 MG capsule Take 1 capsule (20 mg total) by mouth daily. 09/15/17  Yes [name], MD  rosuvastatin (CRESTOR) 40 MG tablet TAKE 1 TABLET BY MOUTH EVERY DAY 08/20/17  Yes Gollan, Kathlene November, MD  ferrous sulfate 325 (65 FE) MG tablet TAKE 1 TABLET BY MOUTH EVERY DAY WITH BREAKFAST Patient not taking: Reported on 01/05/2018 01/02/18   [name], MD    Physical Exam: Vitals:   01/04/18 1724 01/04/18 1725 01/04/18 2126  BP: (!) 160/86  (!) 161/76  Pulse: 78  75  Resp: 17  14  Temp: 98.3 F (36.8 C)  98 F (36.7 C)  TempSrc: Oral  Oral  SpO2: 100%  99%  Weight:  73.9 kg (163 lb)   Height:  5\' 11"  (1.803 m)       Constitutional: Moderately built and nourished.  Vitals:   01/04/18 1724 01/04/18 1725 01/04/18 2126  BP: (!) 160/86  (!) 161/76  Pulse: 78  75  Resp: 17  14  Temp: 98.3 F (36.8 C)  98 F (36.7 C)  TempSrc: Oral  Oral  SpO2: 100%  99%  Weight:  73.9 kg (163 lb)   Height:  5\' 11"   (1.803 m)    Eyes: Anicteric no pallor. ENMT: No discharge from the ears eyes nose or mouth. Neck: No mass palpated no JVD appreciated. Respiratory no rhonchi or crepitations. Cardiovascular: S1-S2 heard no murmurs appreciated. Abdomen: Soft nontender bowel sounds present. Musculoskeletal: No edema.  No joint effusion. Skin: No rash. Neurologic: Alert awake oriented to time place and person.  Moves all extremities. Psychiatric: Appears normal.  Normal affect.   Labs on Admission: I have personally reviewed following labs and imaging studies  CBC: Recent  Labs  Lab 01/01/18 1137 01/04/18 1735  WBC 6.1 7.0  NEUTROABS  --  5.3  HGB 13.0 13.2  HCT 39.4 42.2  MCV 96.9 98.6  PLT 196.0 595   Basic Metabolic Panel: Recent Labs  Lab 01/01/18 1137 01/04/18 1735  NA 138 141  K 4.3 4.5  CL 105 108  CO2 24 24  GLUCOSE 103* 105*  BUN 21 20  CREATININE 1.39 1.72*  CALCIUM 9.2 9.2   GFR: Estimated Creatinine Clearance: 35.8 mL/min (A) (by C-G formula based on SCr of 1.72 mg/dL (H)). Liver Function Tests: Recent Labs  Lab 01/04/18 1735  AST 19  ALT 21  ALKPHOS 92  BILITOT 0.4  PROT 6.6  ALBUMIN 3.7   No results for input(s): LIPASE, AMYLASE in the last 168 hours. No results for input(s): AMMONIA in the last 168 hours. Coagulation Profile: No results for input(s): INR, PROTIME in the last 168 hours. Cardiac Enzymes: No results for input(s): CKTOTAL, CKMB, CKMBINDEX, TROPONINI in the last 168 hours. BNP (last 3 results) No results for input(s): PROBNP in the last 8760 hours. HbA1C: No results for input(s): HGBA1C in the last 72 hours. CBG: No results for input(s): GLUCAP in the last 168 hours. Lipid Profile: No results for input(s): CHOL, HDL, LDLCALC, TRIG, CHOLHDL, LDLDIRECT in the last 72 hours. Thyroid Function Tests: No results for input(s): TSH, T4TOTAL, FREET4, T3FREE, THYROIDAB in the last 72 hours. Anemia Panel: No results for input(s): VITAMINB12, FOLATE,  FERRITIN, TIBC, IRON, RETICCTPCT in the last 72 hours. Urine analysis:    Component Value Date/Time   COLORURINE YELLOW 01/04/2018 1745   APPEARANCEUR CLOUDY (A) 01/04/2018 1745   LABSPEC 1.018 01/04/2018 1745   PHURINE 5.0 01/04/2018 1745   GLUCOSEU NEGATIVE 01/04/2018 1745   HGBUR LARGE (A) 01/04/2018 1745   BILIRUBINUR NEGATIVE 01/04/2018 1745   BILIRUBINUR negative 01/01/2018 1055   KETONESUR NEGATIVE 01/04/2018 1745   PROTEINUR 30 (A) 01/04/2018 1745   UROBILINOGEN 0.2 01/01/2018 1055   NITRITE NEGATIVE 01/04/2018 1745   LEUKOCYTESUR LARGE (A) 01/04/2018 1745   Sepsis Labs: @LABRCNTIP (procalcitonin:4,lacticidven:4) ) Recent Results (from the past 240 hour(s))  Urine Culture     Status: Abnormal   Collection Time: 01/01/18 12:39 PM  Result Value Ref Range Status   MICRO NUMBER: [id number]  Final   SPECIMEN QUALITY: ADEQUATE  Final   Sample Source NOT GIVEN  Final   STATUS: FINAL  Final   ISOLATE 1: Pseudomonas aeruginosa (A)  Final    Comment: 50,000-100,000 CFU/mL of Pseudomonas aeruginosa   ISOLATE 2: Pseudomonas aeruginosa (A)  Final    Comment: 50,000-100,000 CFU/mL of Pseudomonas aeruginosa      Susceptibility   Pseudomonas aeruginosa - URINE CULTURE, REFLEX    CEFTAZIDIME  Intermediate     CEFEPIME 32 Resistant     CIPROFLOXACIN 2 Intermediate     LEVOFLOXACIN 4 Intermediate     GENTAMICIN <=1 Sensitive     IMIPENEM <=0.25 Sensitive     PIP/TAZO 16 Intermediate     TOBRAMYCIN <=1 Sensitive    Pseudomonas aeruginosa - URINE CULTURE NEGATIVE 2    CEFTAZIDIME 4 Sensitive     CEFEPIME 4 Sensitive     CIPROFLOXACIN <=0.25 Sensitive     LEVOFLOXACIN 1 Sensitive     GENTAMICIN 2 Sensitive     IMIPENEM 2 Sensitive     PIP/TAZO 8 Sensitive     TOBRAMYCIN* <=1 Sensitive      * Legend:S = Susceptible  I = IntermediateR =  Resistant  NS = Not susceptible* = Not tested  NR = Not reported**NN = See antimicrobic comments     Radiological Exams on Admission: Dg Chest 2  View  Result Date: 01/05/2018 CLINICAL DATA:  Productive cough. EXAM: CHEST - 2 VIEW COMPARISON:  09/10/2017 FINDINGS: Post median sternotomy and CABG. Normal heart size and mediastinal contours. Lingular scarring. Slight improved aeration of the lung bases from prior. No focal airspace disease, pulmonary edema or pleural effusion. No pneumothorax. No acute osseous abnormalities. Calcified density projecting over the left mid lung on prior exam appears to be related to the scapula. IMPRESSION: Left lung base scarring.  No acute findings. Electronically Signed   By: Jeb Levering M.D.   On: 01/05/2018 00:48   Ct Renal Stone Study  Result Date: 01/05/2018 CLINICAL DATA:  Urinary tract stone. Recent diagnosis of urinary tract infection. EXAM: CT ABDOMEN AND PELVIS WITHOUT CONTRAST TECHNIQUE: Multidetector CT imaging of the abdomen and pelvis was performed following the standard protocol without IV contrast. COMPARISON:  CT 04/04/2016 FINDINGS: Lower chest: Fine reticular opacities in the subpleural lower lobes. No confluent consolidation. No pleural effusion. Hepatobiliary: No focal hepatic lesion on noncontrast exam. Small calcified gallstones in the fundus. No pericholecystic inflammation or abnormal distention. No biliary dilatation. Pancreas: Parenchymal atrophy. No ductal dilatation or inflammation. Spleen: Calcified granuloma.  Normal in size. Adrenals/Urinary Tract: Normal adrenal glands. Chronic left renal parenchymal atrophy with upper pole calyceal diverticulum containing multiple large calculi, similar to prior exam. Least 3 adjacent stones in the distal left ureter just proximal to the ureterovesicular junction, distal most stone measuring 3 mm, 2 adjacent stones measuring 7 and 5 mm slightly more proximally. There is no ureteral dilatation. No left perinephric edema. Cysts in the left kidney appears similar to prior exam. Least 4 nonobstructing stones throughout the right kidney. No right  hydronephrosis or ureteral dilatation. No right ureteral calculi. Scattered cysts again seen in the right kidney. 6 mm stone in the right urinary trigone, stone in this region previously measured 18 x 12 mm. Urinary bladder is partially distended. No wall thickening. Stomach/Bowel: Sigmoid colonic tortuosity with mild colonic diverticulosis. No diverticulitis. Moderate colonic stool burden throughout the entire colon. No abnormal rectal distention. The appendix is not confidently visualized, no pericecal inflammation. No small bowel dilatation, inflammatory change or apparent wall thickening. Stomach distended with ingested contents. Small hiatal hernia. Vascular/Lymphatic: Aortic atherosclerosis with ectasia of the infrarenal aorta, maximal dimension 2.9 cm. Bi-iliac atherosclerosis. No enlarged abdominal or pelvic lymph nodes. Reproductive: Brachytherapy seeds in the prostate bed. Other: Prior left inguinal hernia repair. No free air, free fluid, or intra-abdominal fluid collection. Musculoskeletal: Bones are under mineralized. Chronic L1 compression fracture. Multilevel degenerative change in the spine. No acute osseous abnormalities. Few scattered bone islands in the lumbar spine and pelvis are unchanged. IMPRESSION: 1. Nonobstructing stones within both kidneys, including stones within a left upper kidney calyceal diverticulum. There are 3 adjacent stones in the distal left ureter measuring up to 7 mm, but without ureteral dilatation or perinephric edema. 2. A 6 mm stone is in the right urinary bladder. Stone in this region previously measured 18 x 12 mm, may reflect a residual stone fragment or recurrent stone. 3. Fine subpleural reticular opacities at the lung bases, nonspecific. While this may represent atelectasis, in the setting of cough, interstitial lung disease is considered. Consider nonemergent high-resolution chest CT for further characterization. 4. Ectatic atherosclerotic aorta maximal dimension 2.9  cm at risk for aneurysm development. Recommend  followup by ultrasound in 5 years. This recommendation follows ACR consensus guidelines: White Paper of the ACR Incidental Findings Committee II on Vascular Findings. J Am Coll Radiol 2013; 10:789-794. Aortic Atherosclerosis (ICD10-I70.0). 5. Mild colonic diverticulosis without diverticulitis. Gallstones without gallbladder inflammation. Electronically Signed   By: Jeb Levering M.D.   On: 01/05/2018 00:58     Assessment/Plan Active Problems:   Hx of CABG x 5 '06   Essential hypertension   Persistent atrial fibrillation (HCC)   AKI (acute kidney injury) ([place])   Complicated UTI (urinary tract infection)   Urolithiasis    1. Complicated UTI with distal ureteral stone on the left side -patient is to be transferred to [facility] for possible stent placement.  Patient will be kept n.p.o.  Patient on meropenem for Pseudomonas aeruginosa UTI.  Patient's last dose of apixaban was yesterday morning.  Will hold off any further anticoagulation and patient agrees with it. 2. History of atrial fibrillation on amiodarone.  Presently rate controlled.  Holding apixaban due to possible procedure.  Patient agrees with it. 3. Acute on chronic renal failure likely from obstruction.  Follow metabolic panel closely. 4. History of CAD status post CABG denies any chest pain.  On Crestor. 5. History of diastolic CHF presently appears compensated. 6. History of hypertension presently not on any antihypertensives. 7. History of iron deficiency anemia follow CBC.   DVT prophylaxis: SCDs. Code Status: Full code. Family Communication: Discussed with patient. Disposition Plan: Home. Consults called: Dr. Alyson Ingles urologist. Admission status: Inpatient.   Rise Patience MD [facility] Pager [phone].  If 7PM-7AM, please contact night-coverage [email or web address] Password [email or web address]  01/05/2018, 3:09 AM

## 2018-01-05 NOTE — ED Notes (Signed)
Daughter orlie cundari [phone]

## 2018-01-05 NOTE — Progress Notes (Signed)
Pharmacy Antibiotic Note  Carl [name] is a 82 y.o. male admitted on 01/04/2018 with MDR Pseudomonal UTI.  Pharmacy has been consulted for Meropenem dosing.  Plan: Meropenem 1 g IV q12h  Height: 5\' 11"  (180.3 cm) Weight: 163 lb (73.9 kg) IBW/kg (Calculated) : 75.3  Temp (24hrs), Avg:98.2 F (36.8 C), Min:98 F (36.7 C), Max:98.3 F (36.8 C)  Recent Labs  Lab 01/01/18 1137 01/04/18 1735 01/04/18 1757  WBC 6.1 7.0  --   CREATININE 1.39 1.72*  --   LATICACIDVEN  --   --  1.20    Estimated Creatinine Clearance: 35.8 mL/min (A) (by C-G formula based on SCr of 1.72 mg/dL (H)).    No Known Allergies  Caryl Pina 01/05/2018 3:08 AM

## 2018-01-06 DIAGNOSIS — I480 Paroxysmal atrial fibrillation: Secondary | ICD-10-CM

## 2018-01-06 DIAGNOSIS — N390 Urinary tract infection, site not specified: Secondary | ICD-10-CM

## 2018-01-06 DIAGNOSIS — N201 Calculus of ureter: Secondary | ICD-10-CM

## 2018-01-06 DIAGNOSIS — Z951 Presence of aortocoronary bypass graft: Secondary | ICD-10-CM

## 2018-01-06 DIAGNOSIS — N183 Chronic kidney disease, stage 3 (moderate): Secondary | ICD-10-CM

## 2018-01-06 LAB — URINE CULTURE: Culture: NO GROWTH

## 2018-01-06 LAB — BASIC METABOLIC PANEL
Anion gap: 5 (ref 5–15)
BUN: 19 mg/dL (ref 6–20)
CO2: 26 mmol/L (ref 22–32)
Calcium: 8.7 mg/dL — ABNORMAL LOW (ref 8.9–10.3)
Chloride: 107 mmol/L (ref 101–111)
Creatinine, Ser: 1.51 mg/dL — ABNORMAL HIGH (ref 0.61–1.24)
GFR calc Af Amer: 49 mL/min — ABNORMAL LOW (ref >60)
GFR calc non Af Amer: 42 mL/min — ABNORMAL LOW (ref >60)
Glucose, Bld: 93 mg/dL (ref 65–99)
Potassium: 4.4 mmol/L (ref 3.5–5.1)
Sodium: 138 mmol/L (ref 135–145)

## 2018-01-06 LAB — GLUCOSE, CAPILLARY: Glucose-Capillary: 94 mg/dL (ref 65–99)

## 2018-01-06 MED ORDER — MEROPENEM IV (FOR PTA / DISCHARGE USE ONLY): 1.0000 g | Freq: Two times a day (BID) | INTRAVENOUS | 0 refills | Status: AC

## 2018-01-06 MED ORDER — APIXABAN 2.5 MG PO TABS: 2.5000 mg | ORAL_TABLET | Freq: Two times a day (BID) | ORAL | 0 refills | Status: DC

## 2018-01-06 MED ORDER — SODIUM CHLORIDE 0.9% FLUSH: 10.0000 mL | Freq: Two times a day (BID) | INTRAVENOUS | Status: DC

## 2018-01-06 MED ORDER — TAMSULOSIN HCL 0.4 MG PO CAPS: 0.4000 mg | ORAL_CAPSULE | Freq: Every day | ORAL | 0 refills | Status: AC

## 2018-01-06 MED ORDER — APIXABAN 2.5 MG PO TABS: 2.5000 mg | ORAL_TABLET | Freq: Two times a day (BID) | ORAL | Status: DC

## 2018-01-06 MED ORDER — SODIUM CHLORIDE 0.9% FLUSH: 10.0000 mL | INTRAVENOUS | Status: DC | PRN

## 2018-01-06 MED ORDER — TAMSULOSIN HCL 0.4 MG PO CAPS: 0.4000 mg | ORAL_CAPSULE | Freq: Every day | ORAL | Status: DC

## 2018-01-06 NOTE — Discharge Summary (Signed)
Discharge Summary  BRAINARD HIGHFILL EHM:[id number] DOB: March 04, 1936  PCP: [name], MD  Admit date: 01/04/2018 Discharge date: 01/06/2018  Time spent: 89mns, more than 50% time spent on coordination of care  Recommendations for Outpatient Follow-up:  1. F/u with PMD within a week  for hospital discharge follow up, repeat cbc/bmp at follow up. pmd to refer patient to nephrology for ckd III 2. F/u with urology Dr BGloriann Loan3. F/u with cardiology for afib 4. Home health arranged  Discharge Diagnoses:  Active Hospital Problems   Diagnosis Date Noted  . Complicated UTI (urinary tract infection) 01/05/2018  . Urolithiasis 01/05/2018  . AKI (acute kidney injury) ([facility] 01/01/2018  . Persistent atrial fibrillation ([facility] [place] 09/04/2016  . Essential hypertension 08/05/2011  . Hx of CABG x 5 '06 02/04/2011    Resolved Hospital Problems  No resolved problems to display.    Discharge Condition: stable  Diet recommendation: heart healthy  Filed Weights   01/04/18 1725 01/05/18 0415  Weight: 73.9 kg (163 lb) 75.9 kg (167 lb 4.8 oz)    History of present illness:  PCP: SLeone Haven MD  Patient coming from: Home.  Chief Complaint: Urinary tract infection.  HPI: Carl MALTERis a 82y.o. male with history of atrial fibrillation, CAD, chronic kidney disease, hypertension, bladder cancer has been experiencing urinary frequency with suprapubic pain for the last few days and had gone to his PCP on June 7 4 days ago and had been empirically placed on Cipro and urine culture shows multidrug resistant Pseudomonas aeruginosa and was instructed to come to the ER.  Patient denies any fever chill nausea vomiting diarrhea chest pain or shortness of breath s.  Still has suprapubic pain.   ED Course: Labs in the ER reveal acute on chronic renal failure.  Patient is afebrile.  CT scan done for renal stone study shows left distal ureteral stone.  Dr. MAlyson Ingleswas consulted patient  admitted for further management of complicated UTI with distal ureteral obstruction.  Patient's last dose of apixaban was yesterday morning.  Foley    Hospital Course:  Active Problems:   Hx of CABG x 5 '06   Essential hypertension   Persistent atrial fibrillation (HCC)   AKI (acute kidney injury) ([facility]   Complicated UTI (urinary tract infection)   Urolithiasis   Complicated UTI (urinary tract infection) with left urolithiasis: -Started empirically on IV meropenem due history of Pseudomonas urine culture on 01/01/2018 -urology Dr. MAlyson Ingleshas evaluated the patient the patient expressed that stone had passed he recommended no further intervention and to continue antibiotic regimen -Case discussed with infectious disease Dr CMegan Salonwho recommended total of 5days meropenem, he received 2days abx in the hospital, midline placed, he is discharged home with home health. He is to get three more days of iv abx. -He is started on flomax  PAF Sinus rhythm with first degree av block on tel CkdIII, ago 80, eliquis dose decreased to 2.532mbid -continue home meds amiodarone -he is to follow up with cardiology  CKDIII -cr fluctuating 1,72-1,42-1,52 -renal dosing meds -pcp to refer patient to nephrology  Hx of CABG x 5 '06 Denies any chest pain is symptomatic continue Crestor.  Chronic diastolic heart failure: Seems to be compensated.  H/o prostate cancer s/p radioactive seed implants , currently on casodex, follow up with urology   Right knee pain: -X ray with Moderate joint effusion.  Diffuse chondrocalcinosis -he is to follow up with pmd -home health Pt arranged  Procedures:  Mid line placement  Consultations:  Urology  Phone conversation with infectious disease Dr Megan Salon  Discharge Exam: BP 123/74 (BP Location: Left Arm)   Pulse 64   Temp 97.6 F (36.4 C) (Oral)   Resp 18   Ht _0  (1.803 m)   Wt 75.9 kg (167 lb 4.8 oz)   SpO2 98%   BMI 23.33 kg/m    General: NAD Cardiovascular: RRR Respiratory: CTABL   Discharge Instructions You were cared for by a hospitalist during your hospital stay. If you have any questions about your discharge medications or the care you received while you were in the hospital after you are discharged, you can call the unit and asked to speak with the hospitalist on call if the hospitalist that took care of you is not available. Once you are discharged, your primary care physician will handle any further medical issues. Please note that NO REFILLS for any discharge medications will be authorized once you are discharged, as it is imperative that you return to your primary care physician (or establish a relationship with a primary care physician if you do not have one) for your aftercare needs so that they can reassess your need for medications and monitor your lab values.  Discharge Instructions    Diet - low sodium heart healthy   Complete by:  As directed    Home infusion instructions [facility] May follow [place] Dosing Protocol; May administer Cathflo as needed to maintain patency of vascular access device.; Flushing of vascular access device: per [facility] Protocol: 0.9% NaCl pre/post medica...   Complete by:  As directed    Instructions:  May follow [place] Dosing Protocol   Instructions:  May administer Cathflo as needed to maintain patency of vascular access device.   Instructions:  Flushing of vascular access device: per [facility] Protocol: 0.9% NaCl pre/post medication administration and prn patency; Heparin 100 u/ml, 82m for implanted ports and Heparin 10u/ml, 572mfor all other central venous catheters.   Instructions:  May follow [facility] Anaphylaxis Protocol for First Dose Administration in the home: 0.9% NaCl at 25-50 ml/hr to maintain IV access for protocol meds. Epinephrine 0.3 ml IV/IM PRN and Benadryl 25-50 IV/IM PRN s/s of anaphylaxis.   Instructions:  AdWyattnfusion Coordinator (RN) to  assist per patient IV care needs in the home PRN.   Increase activity slowly   Complete by:  As directed      Allergies as of 01/06/2018   No Known Allergies     Medication List    STOP taking these medications   ciprofloxacin 250 MG tablet Commonly known as:  CIPRO   ferrous sulfate 325 (65 FE) MG tablet     TAKE these medications   amiodarone 200 MG tablet Commonly known as:  PACERONE Take 1 tablet (200 mg total) by mouth daily.   apixaban 2.5 MG Tabs tablet Commonly known as:  ELIQUIS Take 1 tablet (2.5 mg total) by mouth 2 (two) times daily. What changed:    medication strength  how much to take   bicalutamide 50 MG tablet Commonly known as:  CASODEX Take 50 mg by mouth daily with breakfast.   meropenem IVPB Commonly known as:  MERREM Inject 1 g into the vein every 12 (twelve) hours for 3 days. Indication:  MDR Pseudomonal UTI Last Day of Therapy:  01/09/18 Labs - Once weekly:  CBC/D and BMP, Labs - Every other week:  ESR and CRP  omeprazole 20 MG capsule Commonly known as:  PRILOSEC Take 1 capsule (20 mg total) by mouth daily. Notes to patient:  Resume home regimen   rosuvastatin 40 MG tablet Commonly known as:  CRESTOR TAKE 1 TABLET BY MOUTH EVERY DAY   tamsulosin 0.4 MG Caps capsule Commonly known as:  FLOMAX Take 1 capsule (0.4 mg total) by mouth daily after supper.   TYLENOL 500 MG tablet Generic drug:  acetaminophen Take 500 mg by mouth every 6 (six) hours as needed (for pain).            Home Infusion Instuctions  (From admission, onward)        Start     Ordered   01/06/18 0000  Home infusion instructions [facility] May follow [place] Dosing Protocol; May administer Cathflo as needed to maintain patency of vascular access device.; Flushing of vascular access device: per [facility] Protocol: 0.9% NaCl pre/post medica...    Question Answer Comment  Instructions May follow [place] Dosing Protocol   Instructions May  administer Cathflo as needed to maintain patency of vascular access device.   Instructions Flushing of vascular access device: per [facility] Protocol: 0.9% NaCl pre/post medication administration and prn patency; Heparin 100 u/ml, 37m for implanted ports and Heparin 10u/ml, 573mfor all other central venous catheters.   Instructions May follow [facility] Anaphylaxis Protocol for First Dose Administration in the home: 0.9% NaCl at 25-50 ml/hr to maintain IV access for protocol meds. Epinephrine 0.3 ml IV/IM PRN and Benadryl 25-50 IV/IM PRN s/s of anaphylaxis.   Instructions [facility] Infusion Coordinator (RN) to assist per patient IV care needs in the home PRN.      01/06/18 1327     No Known Allergies Follow-up Information    BeMarton RedwoodII, MD Follow up in 1 week(s).   Specialty:  Urology Why:  for kidney stones Contact information: [phone]      GoMinna MerrittsMD. Schedule an appointment as soon as possible for a visit in 3 week(s).   Specialty:  Cardiology Why:  for afib management Contact information: [phone]        SoLeone HavenMD. Schedule an appointment as soon as possible for a visit in 1 week(s).   Specialty:  Family Medicine Why:  hospital discharge follow up, pcp to repeat cbc/bmp at follow up. pcp to refer to nephrology for chronic kidney disease. Contact information: [address] [phone]      Health, AdBonneau Beachollow up.   Specialty:  HoDexterhy:  [facility],supplies;HH physical therapy Contact information: [address] [place] [phone]          The results of significant diagnostics from this hospitalization (including imaging, microbiology, ancillary and laboratory) are listed below for reference.    Significant Diagnostic  Studies: Dg Chest 2 View  Result Date: 01/05/2018 CLINICAL DATA:  Productive cough. EXAM: CHEST - 2 VIEW COMPARISON:  09/10/2017 FINDINGS: Post median sternotomy and CABG. Normal heart size and mediastinal contours. Lingular scarring. Slight improved aeration of the lung bases from prior. No focal airspace disease, pulmonary edema or pleural effusion. No pneumothorax. No acute osseous abnormalities. Calcified density projecting over the left mid lung on prior exam appears to be related to the scapula. IMPRESSION: Left lung base  scarring.  No acute findings. Electronically Signed   By: Jeb Levering M.D.   On: 01/05/2018 00:48   Dg Knee 1-2 Views Right  Result Date: 01/05/2018 CLINICAL DATA:  Diffuse pain over the last 2 days.  No known injury. EXAM: RIGHT KNEE - 1-2 VIEW COMPARISON:  None. FINDINGS: Moderate joint effusion. No joint space narrowing. Chondrocalcinosis which could be degenerative or relate to CPPD. No fracture or focal lesion. IMPRESSION: Moderate joint effusion.  Diffuse chondrocalcinosis. Electronically Signed   By: Nelson Chimes M.D.   On: 01/05/2018 12:54   Ct Renal Stone Study  Result Date: 01/05/2018 CLINICAL DATA:  Urinary tract stone. Recent diagnosis of urinary tract infection. EXAM: CT ABDOMEN AND PELVIS WITHOUT CONTRAST TECHNIQUE: Multidetector CT imaging of the abdomen and pelvis was performed following the standard protocol without IV contrast. COMPARISON:  CT 04/04/2016 FINDINGS: Lower chest: Fine reticular opacities in the subpleural lower lobes. No confluent consolidation. No pleural effusion. Hepatobiliary: No focal hepatic lesion on noncontrast exam. Small calcified gallstones in the fundus. No pericholecystic inflammation or abnormal distention. No biliary dilatation. Pancreas: Parenchymal atrophy. No ductal dilatation or inflammation. Spleen: Calcified granuloma.  Normal in size. Adrenals/Urinary Tract: Normal adrenal glands. Chronic left renal parenchymal atrophy with  upper pole calyceal diverticulum containing multiple large calculi, similar to prior exam. Least 3 adjacent stones in the distal left ureter just proximal to the ureterovesicular junction, distal most stone measuring 3 mm, 2 adjacent stones measuring 7 and 5 mm slightly more proximally. There is no ureteral dilatation. No left perinephric edema. Cysts in the left kidney appears similar to prior exam. Least 4 nonobstructing stones throughout the right kidney. No right hydronephrosis or ureteral dilatation. No right ureteral calculi. Scattered cysts again seen in the right kidney. 6 mm stone in the right urinary trigone, stone in this region previously measured 18 x 12 mm. Urinary bladder is partially distended. No wall thickening. Stomach/Bowel: Sigmoid colonic tortuosity with mild colonic diverticulosis. No diverticulitis. Moderate colonic stool burden throughout the entire colon. No abnormal rectal distention. The appendix is not confidently visualized, no pericecal inflammation. No small bowel dilatation, inflammatory change or apparent wall thickening. Stomach distended with ingested contents. Small hiatal hernia. Vascular/Lymphatic: Aortic atherosclerosis with ectasia of the infrarenal aorta, maximal dimension 2.9 cm. Bi-iliac atherosclerosis. No enlarged abdominal or pelvic lymph nodes. Reproductive: Brachytherapy seeds in the prostate bed. Other: Prior left inguinal hernia repair. No free air, free fluid, or intra-abdominal fluid collection. Musculoskeletal: Bones are under mineralized. Chronic L1 compression fracture. Multilevel degenerative change in the spine. No acute osseous abnormalities. Few scattered bone islands in the lumbar spine and pelvis are unchanged. IMPRESSION: 1. Nonobstructing stones within both kidneys, including stones within a left upper kidney calyceal diverticulum. There are 3 adjacent stones in the distal left ureter measuring up to 7 mm, but without ureteral dilatation or perinephric  edema. 2. A 6 mm stone is in the right urinary bladder. Stone in this region previously measured 18 x 12 mm, may reflect a residual stone fragment or recurrent stone. 3. Fine subpleural reticular opacities at the lung bases, nonspecific. While this may represent atelectasis, in the setting of cough, interstitial lung disease is considered. Consider nonemergent high-resolution chest CT for further characterization. 4. Ectatic atherosclerotic aorta maximal dimension 2.9 cm at risk for aneurysm development. Recommend followup by ultrasound in 5 years. This recommendation follows ACR consensus guidelines: White Paper of the ACR Incidental Findings Committee II on Vascular Findings. J Am Coll Radiol 2013; 10:789-794. Aortic  Atherosclerosis (ICD10-I70.0). 5. Mild colonic diverticulosis without diverticulitis. Gallstones without gallbladder inflammation. Electronically Signed   By: Jeb Levering M.D.   On: 01/05/2018 00:58    Microbiology: Recent Results (from the past 240 hour(s))  Urine Culture     Status: Abnormal   Collection Time: 01/01/18 12:39 PM  Result Value Ref Range Status   MICRO NUMBER: [id number]  Final   SPECIMEN QUALITY: ADEQUATE  Final   Sample Source NOT GIVEN  Final   STATUS: FINAL  Final   ISOLATE 1: Pseudomonas aeruginosa (A)  Final    Comment: 50,000-100,000 CFU/mL of Pseudomonas aeruginosa   ISOLATE 2: Pseudomonas aeruginosa (A)  Final    Comment: 50,000-100,000 CFU/mL of Pseudomonas aeruginosa      Susceptibility   Pseudomonas aeruginosa - URINE CULTURE, REFLEX    CEFTAZIDIME  Intermediate     CEFEPIME 32 Resistant     CIPROFLOXACIN 2 Intermediate     LEVOFLOXACIN 4 Intermediate     GENTAMICIN <=1 Sensitive     IMIPENEM <=0.25 Sensitive     PIP/TAZO 16 Intermediate     TOBRAMYCIN <=1 Sensitive    Pseudomonas aeruginosa - URINE CULTURE NEGATIVE 2    CEFTAZIDIME 4 Sensitive     CEFEPIME 4 Sensitive     CIPROFLOXACIN <=0.25 Sensitive     LEVOFLOXACIN 1 Sensitive      GENTAMICIN 2 Sensitive     IMIPENEM 2 Sensitive     PIP/TAZO 8 Sensitive     TOBRAMYCIN* <=1 Sensitive      * Legend:S = Susceptible  I = IntermediateR = Resistant  NS = Not susceptible* = Not tested  NR = Not reported**NN = See antimicrobic comments  Blood culture (routine x 2)     Status: None (Preliminary result)   Collection Time: 01/05/18  1:15 AM  Result Value Ref Range Status   Specimen Description BLOOD RIGHT HAND  Final   Special Requests   Final    BOTTLES DRAWN AEROBIC AND ANAEROBIC Blood Culture adequate volume   Culture   Final    NO GROWTH 1 DAY Performed at [facility], [address], [place], [place] [address]    Report Status PENDING  Incomplete  Urine Culture     Status: None   Collection Time: 01/05/18  1:17 AM  Result Value Ref Range Status   Specimen Description URINE, RANDOM  Final   Special Requests NONE  Final   Culture   Final    NO GROWTH Performed at [facility], [address] [place], [place] [address]    Report Status 01/06/2018 FINAL  Final  Blood culture (routine x 2)     Status: None (Preliminary result)   Collection Time: 01/05/18  1:20 AM  Result Value Ref Range Status   Specimen Description BLOOD LEFT ANTECUBITAL  Final   Special Requests   Final    BOTTLES DRAWN AEROBIC AND ANAEROBIC Blood Culture adequate volume   Culture   Final    NO GROWTH 1 DAY Performed at [facility], [place] [address], [place], [place] [address]    Report Status PENDING  Incomplete     Labs: Basic Metabolic Panel: Recent Labs  Lab 01/01/18 1137 01/04/18 1735 01/05/18 0831 01/06/18 0515  NA 138 141 139 138  K 4.3 4.5 4.6 4.4  CL 105 108 109 107  CO2 _0 GLUCOSE 103* 105* 105* 93  BUN 21 20 22* 19  CREATININE 1.39  1.72* 1.42* 1.51*  CALCIUM 9.2 9.2 8.6* 8.7*   Liver Function Tests: Recent Labs  Lab 01/04/18 1735  AST 19  ALT 21  ALKPHOS 92  BILITOT 0.4  PROT 6.6  ALBUMIN 3.7   No results for input(s): LIPASE,  AMYLASE in the last 168 hours. No results for input(s): AMMONIA in the last 168 hours. CBC: Recent Labs  Lab 01/01/18 1137 01/04/18 1735  WBC 6.1 7.0  NEUTROABS  --  5.3  HGB 13.0 13.2  HCT 39.4 42.2  MCV 96.9 98.6  PLT 196.0 213   Cardiac Enzymes: No results for input(s): CKTOTAL, CKMB, CKMBINDEX, TROPONINI in the last 168 hours. BNP: BNP (last 3 results) No results for input(s): BNP in the last 8760 hours.  ProBNP (last 3 results) No results for input(s): PROBNP in the last 8760 hours.  CBG: No results for input(s): GLUCAP in the last 168 hours.     Signed:  [name] MD, PhD  [facility] 01/06/2018, 5:27 PM

## 2018-01-06 NOTE — Evaluation (Signed)
Physical Therapy Evaluation Patient Details Name: Carl [name] MRN: [id number] DOB: 22-Jul-1936 Today's Date: 01/06/2018   History of Present Illness  82 yo male admitted with UTI. hx of CAD, CABG, CKD, PAF,orthostatic hypotension, prostate ca  Clinical Impression  On eval, pt required Min assist for bed mobility and Min guard assist for ambulation. He walked ~200 feet without a device. R knee pain rated 5/10. Will continue to follow and progress activity as tolerated. Recommend HHPT f/u if pt is agreeable.     Follow Up Recommendations Home health PT(if pt is agreeable)    Equipment Recommendations  None recommended by PT    Recommendations for Other Services       Precautions / Restrictions Precautions Precautions: Fall Restrictions Weight Bearing Restrictions: No      Mobility  Bed Mobility Overal bed mobility: Needs Assistance Bed Mobility: Supine to Sit;Sit to Supine     Supine to sit: Min assist Sit to supine: Min assist;HOB elevated   General bed mobility comments: Assist for trunk and LEs. Increased time. Pt relied on bedrail. Task was effortful for him.   Transfers Overall transfer level: Needs assistance   Transfers: Sit to/from Stand Sit to Stand: Min guard;From elevated surface         General transfer comment: Increased time. Cues for safety, hand placement.   Ambulation/Gait Ambulation/Gait assistance: Min guard Ambulation Distance (Feet): 200 Feet Assistive device: None Gait Pattern/deviations: Step-through pattern;Antalgic     General Gait Details: Mild antalgia noted. Pt tolerated distance well.   Stairs            Wheelchair Mobility    Modified Rankin (Stroke Patients Only)       Balance Overall balance assessment: Mild deficits observed, not formally tested                                           Pertinent Vitals/Pain Pain Assessment: 0-10 Pain Score: 5  Pain Location: R knee Pain Descriptors /  Indicators: Sore;Aching Pain Intervention(s): Monitored during session;Repositioned    Home Living Family/patient expects to be discharged to:: Private residence Living Arrangements: Children Available Help at Discharge: Family Type of Home: House Home Access: Stairs to enter Entrance Stairs-Rails: Right Entrance Stairs-Number of Steps: 6 Home Layout: One level Home Equipment: Shower seat;Cane - single point;Walker - 2 wheels      Prior Function Level of Independence: Independent               Hand Dominance        Extremity/Trunk Assessment   Upper Extremity Assessment Upper Extremity Assessment: Overall WFL for tasks assessed    Lower Extremity Assessment Lower Extremity Assessment: Generalized weakness;RLE deficits/detail RLE Deficits / Details: Pain with knee flexion    Cervical / Trunk Assessment Cervical / Trunk Assessment: Normal  Communication   Communication: No difficulties  Cognition Arousal/Alertness: Awake/alert Behavior During Therapy: WFL for tasks assessed/performed Overall Cognitive Status: Within Functional Limits for tasks assessed                                        General Comments      Exercises     Assessment/Plan    PT Assessment Patient needs continued PT services  PT Problem List Decreased mobility  PT Treatment Interventions Gait training;Functional mobility training;Therapeutic activities;Balance training;Patient/family education;Therapeutic exercise    PT Goals (Current goals can be found in the Care Plan section)  Acute Rehab PT Goals Patient Stated Goal: home soon. less R knee pain PT Goal Formulation: With patient Time For Goal Achievement: 01/20/18 Potential to Achieve Goals: Good    Frequency Min 3X/week   Barriers to discharge        Co-evaluation               AM-PAC PT "6 Clicks" Daily Activity  Outcome Measure Difficulty turning over in bed (including adjusting  bedclothes, sheets and blankets)?: A Lot Difficulty moving from lying on back to sitting on the side of the bed? : Unable Difficulty sitting down on and standing up from a chair with arms (e.g., wheelchair, bedside commode, etc,.)?: Unable Help needed moving to and from a bed to chair (including a wheelchair)?: A Little Help needed walking in hospital room?: A Little Help needed climbing 3-5 steps with a railing? : A Lot 6 Click Score: 12    End of Session Equipment Utilized During Treatment: Gait belt Activity Tolerance: Patient tolerated treatment well Patient left: with nursing/sitter in room(in bathroom-nursing student in room with pt)   PT Visit Diagnosis: Pain;Other abnormalities of gait and mobility (R26.89) Pain - Right/Left: Right Pain - part of body: Knee    Time: 5993-5701 PT Time Calculation (min) (ACUTE ONLY): 12 min   Charges:   PT Evaluation $PT Eval Moderate Complexity: 1 Mod     PT G Codes:          Weston Anna, MPT Pager: [phone]

## 2018-01-06 NOTE — Progress Notes (Signed)
[facility]  [facility] will provide [place] [facility] and home IV ABX.  [facility] hospital infusion coordinator will teach with daughter today before DC to support independence at home.  If patient discharges after hours, please call [phone].   Carl [name] 01/06/2018, 1:59 PM

## 2018-01-06 NOTE — Progress Notes (Signed)
PHARMACY CONSULT NOTE FOR:  OUTPATIENT  PARENTERAL ANTIBIOTIC THERAPY (OPAT)  Indication: MDR pseudomonas UTI Regimen: Meropenem 1 gm IV q12 End date: 6/115/19  IV antibiotic discharge orders are pended. To discharging provider:  please sign these orders via discharge navigator,  Select New Orders & click on the button choice - Manage This Unsigned Work.     Thank you for allowing pharmacy to be a part of this patient's care.  Eudelia Bunch, Pharm.D. [phone] 01/06/2018 12:25 PM

## 2018-01-06 NOTE — Care Management Note (Signed)
Case Management Note  Patient Details  Name: Carl [name] MRN: [id number] Date of Birth: 13-Dec-1935  Subjective/Objective: Ordered for home iv abx;PT-recc HHPT.Patient chose [facility]-HHRN-iv abx instruction rep Pam aware. [facility] rep Santiago Glad aware of other HHRN/HHPT orders. Awaiting midline prior d/c. Per patient Christian Mate will be the caregiver for instruction [phone].                   Action/Plan:d/c home w/HHC/iv abx.   Expected Discharge Date:  01/06/18               Expected Discharge Plan:  [place]  In-House Referral:     Discharge planning Services  CM Consult  Post Acute Care Choice:  Durable Medical Equipment(rw) Choice offered to:  Patient  DME Arranged:    DME Agency:     HH Arranged:  RN, PT, IV Antibiotics HH Agency:  [facility]  Status of Service:  Completed, signed off  If discussed at [place] of Stay Meetings, dates discussed:    Additional Comments:  Dessa Phi, RN 01/06/2018, 1:39 PM

## 2018-01-08 ENCOUNTER — Telehealth: Payer: Self-pay | Admitting: Family Medicine

## 2018-01-08 ENCOUNTER — Encounter: Payer: Self-pay | Admitting: Family Medicine

## 2018-01-08 ENCOUNTER — Ambulatory Visit: Payer: Self-pay

## 2018-01-08 NOTE — Telephone Encounter (Signed)
It is likely related to his known kidney stones.  If he develops clots he needs to be seen sooner than Monday.  I am okay with him being seen on Monday.  If he starts having pain or fevers or he starts to feel ill he needs to be seen before then.  Thanks.

## 2018-01-08 NOTE — Telephone Encounter (Signed)
Verbal order given  

## 2018-01-08 NOTE — Telephone Encounter (Signed)
Please advise 

## 2018-01-08 NOTE — Telephone Encounter (Signed)
Patient's granddaughter [name] called and says "my grandfather called and told me his urine was a little red this morning. He is still on the antibiotic from the hospital and will finish it up this weekend. I just want to know if this is something that happens when a kidney stone has passed?" I advised it could be a number of things. I asked was it blood clots? She says "I am not sure. You can call him on my husband's phone and talk to him." The patient was called and triaged. He says "I saw a little red and the water was pink, no blood clots. It happened a couple of times this morning." I asked about pain, fever, he denies. According to protocol, see PCP within 24 hours. Patient was released from the hospital and will need a hospital follow up visit. I advised there is an appointment on Monday, 01/11/18 at 1130, but Dr. [name] will have to approve the appointment and someone from the office will call with that appointment, patient verbalized understanding.   Reason for Disposition . Blood in urine  (Exception: could be normal menstrual bleeding)  Answer Assessment - Initial Assessment Questions 1. COLOR of URINE: "Describe the color of the urine."  (e.g., tea-colored, pink, red, blood clots, bloody)     Red, no blood clots-water was pink looking 2. ONSET: "When did the bleeding start?"      This morning 3. EPISODES: "How many times has there been blood in the urine?" or "How many times today?"     2 times today 4. PAIN with URINATION: "Is there any pain with passing your urine?" If so, ask: "How bad is the pain?"  (Scale 1-10; or mild, moderate, severe)    - MILD - complains slightly about urination hurting    - MODERATE - interferes with normal activities      - SEVERE - excruciating, unwilling or unable to urinate because of the pain      No 5. FEVER: "Do you have a fever?" If so, ask: "What is your temperature, how was it measured, and when did it start?"     No 6. ASSOCIATED SYMPTOMS:  "Are you passing urine more frequently than usual?"     No 7. OTHER SYMPTOMS: "Do you have any other symptoms?" (e.g., back/flank pain, abdominal pain, vomiting)     No 8. PREGNANCY: "Is there any chance you are pregnant?" "When was your last menstrual period?"     N/A  Protocols used: URINE - BLOOD IN-A-AH

## 2018-01-08 NOTE — Telephone Encounter (Signed)
Please advise patient was triaged by [facility] Nurse and was diagnosed with kidney stones,please see note below.

## 2018-01-08 NOTE — Telephone Encounter (Signed)
Copied from [place] [id number]. Topic: General - Other >> Jan 08, 2018  9:20 AM Yvette Rack wrote: Reason for CRM: [facility] [phone] calling for verbal orders need plan of care for 2 weeks for 1 week for IV antibiotic she would like a call today the order will expire tomorrow

## 2018-01-08 NOTE — Telephone Encounter (Signed)
fyi

## 2018-01-08 NOTE — Telephone Encounter (Signed)
It is okay to give verbal orders for this. 

## 2018-01-08 NOTE — Telephone Encounter (Signed)
Patients grand daughter notified and patient scheduled

## 2018-01-10 LAB — CULTURE, BLOOD (ROUTINE X 2)
Culture: NO GROWTH
Culture: NO GROWTH
Special Requests: ADEQUATE
Special Requests: ADEQUATE

## 2018-01-11 ENCOUNTER — Encounter: Payer: Self-pay | Admitting: Family Medicine

## 2018-01-11 ENCOUNTER — Ambulatory Visit (INDEPENDENT_AMBULATORY_CARE_PROVIDER_SITE_OTHER): Payer: Medicare Other | Admitting: Family Medicine

## 2018-01-11 ENCOUNTER — Telehealth: Payer: Self-pay | Admitting: Family Medicine

## 2018-01-11 VITALS — BP 122/76 | HR 83 | Temp 98.0°F | Wt 165.4 lb

## 2018-01-11 DIAGNOSIS — N390 Urinary tract infection, site not specified: Secondary | ICD-10-CM

## 2018-01-11 DIAGNOSIS — S51012A Laceration without foreign body of left elbow, initial encounter: Secondary | ICD-10-CM

## 2018-01-11 DIAGNOSIS — N183 Chronic kidney disease, stage 3 unspecified: Secondary | ICD-10-CM

## 2018-01-11 DIAGNOSIS — N201 Calculus of ureter: Secondary | ICD-10-CM

## 2018-01-11 DIAGNOSIS — K635 Polyp of colon: Secondary | ICD-10-CM | POA: Diagnosis not present

## 2018-01-11 DIAGNOSIS — Z23 Encounter for immunization: Secondary | ICD-10-CM

## 2018-01-11 LAB — COMPREHENSIVE METABOLIC PANEL
ALT: 23 U/L (ref 0–53)
AST: 24 U/L (ref 0–37)
Albumin: 3.9 g/dL (ref 3.5–5.2)
Alkaline Phosphatase: 72 U/L (ref 39–117)
BUN: 23 mg/dL (ref 6–23)
CO2: 25 mEq/L (ref 19–32)
Calcium: 9.5 mg/dL (ref 8.4–10.5)
Chloride: 104 mEq/L (ref 96–112)
Creatinine, Ser: 1.3 mg/dL (ref 0.40–1.50)
GFR: 56.43 mL/min — ABNORMAL LOW (ref >60.00)
Glucose, Bld: 113 mg/dL — ABNORMAL HIGH (ref 70–99)
Potassium: 4.5 mEq/L (ref 3.5–5.1)
Sodium: 137 mEq/L (ref 135–145)
Total Bilirubin: 0.4 mg/dL (ref 0.2–1.2)
Total Protein: 6.7 g/dL (ref 6.0–8.3)

## 2018-01-11 LAB — POCT URINALYSIS DIPSTICK
Glucose, UA: NEGATIVE
Nitrite, UA: NEGATIVE
Protein, UA: POSITIVE — AB
Spec Grav, UA: 1.02 (ref 1.010–1.025)
Urobilinogen, UA: 0.2 E.U./dL
pH, UA: 6 (ref 5.0–8.0)

## 2018-01-11 LAB — CBC
HCT: 39.1 % (ref 39.0–52.0)
Hemoglobin: 13.2 g/dL (ref 13.0–17.0)
MCHC: 33.7 g/dL (ref 30.0–36.0)
MCV: 94.2 fl (ref 78.0–100.0)
Platelets: 185 10*3/uL (ref 150.0–400.0)
RBC: 4.15 Mil/uL — ABNORMAL LOW (ref 4.22–5.81)
RDW: 13.7 % (ref 11.5–15.5)
WBC: 5.7 10*3/uL (ref 4.0–10.5)

## 2018-01-11 NOTE — Patient Instructions (Signed)
Nice to see you. Please monitor the scratches on your hand and arm.  If they start to appear red or they start to drain pus or you develop fevers please be evaluated immediately.  You can clean these areas with soap and water and please change the bandage daily. We will check lab work today and contact you with the results. We will get you to see a kidney specialist.  You will need to see your urologist as well.  Please make sure you see the GI doctor as well as discussed previously. If you develop abdominal pain, burning with urination, fevers, or any new or changing symptoms please seek medical attention immediately.

## 2018-01-11 NOTE — Telephone Encounter (Signed)
Copied from [place] [id number]. Topic: Inquiry >> Jan 11, 2018  2:01 PM Scherrie Gerlach wrote: Reason for CRM: donna with [facility] calling ot advise they dc'd pt on 01/09/18 from home health services

## 2018-01-11 NOTE — Telephone Encounter (Signed)
FYI

## 2018-01-11 NOTE — Telephone Encounter (Signed)
Noted  

## 2018-01-11 NOTE — Telephone Encounter (Signed)
fyi

## 2018-01-12 ENCOUNTER — Encounter: Payer: Self-pay | Admitting: Family Medicine

## 2018-01-12 DIAGNOSIS — S51019A Laceration without foreign body of unspecified elbow, initial encounter: Secondary | ICD-10-CM | POA: Insufficient documentation

## 2018-01-12 LAB — URINALYSIS, MICROSCOPIC ONLY

## 2018-01-12 NOTE — Progress Notes (Signed)
Carl Rumps, MD Phone: [phone]  Carl [name] is a 82 y.o. male who presents today for f/u.  CC: hospital follow-up for kidney stone and UTI, also had recent dog scratch yesterday, CKD stage III  Patient was seen previously in our office for frequent urination and diagnosed with a UTI.  He was placed on ciprofloxacin though his urine culture returned back with Pseudomonas sensitive only to IV antibiotics.  He was admitted to the hospital and found to have a kidney stone and placed on meropenem.  Urology evaluated him in the hospital and he is supposed to see them at follow-up.  He was discharged with a PICC line and had home health giving him IV antibiotics for 3 additional days.  This has been removed.  He notes one episode of pink-tinged urine late last week.  He has had no abdominal pain, fevers, chills, or dysuria.  He feels well.  He additionally was diagnosed with CKD stage III.  His Eliquis dose was also decreased given depressed renal function and age.  Also noted to have chest x-ray that revealed resolved prior pneumonia.  The patient reports his granddaughters dog became excited and jumped up on him and scratched the dorsum of his right hand as well as the inner aspect of his left elbow.  They cleansed this with soap and water.  The bleeding was brief drainage.  The dog is up-to-date on its vaccinations.  The dog did not bite the patient.  Social History   Tobacco Use  Smoking Status Former Smoker  . Years: 2.00  . Types: Cigarettes  . Last attempt to quit: 07/28/1960  . Years since quitting: 57.4  Smokeless Tobacco Never Used     ROS see history of present illness  Objective  Physical Exam Vitals:   01/11/18 1127  BP: 122/76  Pulse: 83  Temp: 98 F (36.7 C)  SpO2: 97%    BP Readings from Last 3 Encounters:  01/11/18 122/76  01/06/18 123/74  01/01/18 (!) 150/88   Wt Readings from Last 3 Encounters:  01/11/18 165 lb 6.4 oz (75 kg)  01/05/18 167 lb  4.8 oz (75.9 kg)  01/01/18 163 lb 12.8 oz (74.3 kg)    Physical Exam  Constitutional: No distress.  Cardiovascular: Normal rate, regular rhythm and normal heart sounds.  Pulmonary/Chest: Effort normal and breath sounds normal.  Abdominal: Soft. Bowel sounds are normal. He exhibits no distension. There is no tenderness.  Musculoskeletal: He exhibits no edema.  Neurological: He is alert.  Skin: Skin is warm and dry. He is not diaphoretic.  Dorsum of right hand with several superficial skin tears that are mild in nature with no active bleeding there is no surrounding erythema, there is no tenderness, there is no purulent drainage, similar small superficial skin tears over the medial aspect of his left elbow with no signs of infection.     Assessment/Plan: Please see individual problem list.  Chronic kidney disease (CKD), stage III (moderate) ([place]) Refer to nephrology.  Colon polyps I discussed with his granddaughter that he still needs to see GI for follow-up.  She will contact them to arrange this.  Complicated UTI (urinary tract infection) Has improved.  Given recent pink-tinged urine we will recheck urine.  Urolithiasis They we will see urology as an outpatient.  Skin tear of elbow without complication Skin tear of left elbow and right hand with no signs of infection.  I do not believe there is an indication for antibiotics at this  time.  The areas were cleansed and dressed by nursing.  They will change the dressing daily at home and cleanse with soap and water.  Discussed findings of infection and if those occur he needs to be evaluated.   Orders Placed This Encounter  Procedures  . Urine Culture  . Tdap vaccine greater than or equal to 7yo IM  . Comp Met (CMET)  . CBC  . Urine Microscopic Only  . Ambulatory referral to Nephrology    Referral Priority:   Routine    Referral Type:   Consultation    Referral Reason:   Specialty Services Required    Requested Specialty:    Nephrology    Number of Visits Requested:   1  . POCT Urinalysis Dipstick    No orders of the defined types were placed in this encounter.    Carl Rumps, MD [place]

## 2018-01-12 NOTE — Assessment & Plan Note (Signed)
Skin tear of left elbow and right hand with no signs of infection.  I do not believe there is an indication for antibiotics at this time.  The areas were cleansed and dressed by nursing.  They will change the dressing daily at home and cleanse with soap and water.  Discussed findings of infection and if those occur he needs to be evaluated.

## 2018-01-12 NOTE — Assessment & Plan Note (Signed)
Has improved.  Given recent pink-tinged urine we will recheck urine.

## 2018-01-12 NOTE — Assessment & Plan Note (Signed)
I discussed with his granddaughter that he still needs to see GI for follow-up.  She will contact them to arrange this.

## 2018-01-12 NOTE — Assessment & Plan Note (Addendum)
They we will see urology as an outpatient.

## 2018-01-12 NOTE — Assessment & Plan Note (Signed)
Refer to nephrology 

## 2018-01-13 ENCOUNTER — Telehealth: Payer: Self-pay | Admitting: Family Medicine

## 2018-01-13 ENCOUNTER — Ambulatory Visit: Payer: Medicare Other

## 2018-01-13 LAB — URINE CULTURE
MICRO NUMBER:: 90728097
Result:: NO GROWTH
SPECIMEN QUALITY:: ADEQUATE

## 2018-01-13 NOTE — Telephone Encounter (Signed)
Charted in result notes. 

## 2018-01-13 NOTE — Telephone Encounter (Signed)
Copied from [place] [id number]. Topic: Quick Communication - Lab Results >> Jan 13, 2018  9:37 AM Juanda Chance, CMA wrote: Bonne Dol[place] calling, no voicemail. [place] for pec to speak to patients daughter or patient to inform of message below   [phone]

## 2018-01-16 ENCOUNTER — Other Ambulatory Visit: Payer: Self-pay | Admitting: Cardiovascular Disease

## 2018-01-22 ENCOUNTER — Other Ambulatory Visit: Payer: Self-pay | Admitting: Cardiovascular Disease

## 2018-01-25 ENCOUNTER — Other Ambulatory Visit: Payer: Self-pay | Admitting: Urology

## 2018-01-25 DIAGNOSIS — C61 Malignant neoplasm of prostate: Secondary | ICD-10-CM

## 2018-02-02 ENCOUNTER — Encounter (HOSPITAL_COMMUNITY)
Admission: RE | Admit: 2018-02-02 | Discharge: 2018-02-02 | Disposition: A | Discharge status: Home / Self Care | Payer: Medicare Other | Source: Ambulatory Visit | Attending: Urology | Admitting: Urology

## 2018-02-02 DIAGNOSIS — C61 Malignant neoplasm of prostate: Secondary | ICD-10-CM | POA: Diagnosis present

## 2018-02-02 MED ORDER — AXUMIN (FLUCICLOVINE F 18) INJECTION: 10.4000 | Freq: Once | INTRAVENOUS | Status: DC

## 2018-02-14 ENCOUNTER — Other Ambulatory Visit: Payer: Self-pay | Admitting: Cardiovascular Disease

## 2018-02-23 ENCOUNTER — Other Ambulatory Visit: Payer: Self-pay | Admitting: Family Medicine

## 2018-03-04 ENCOUNTER — Ambulatory Visit (INDEPENDENT_AMBULATORY_CARE_PROVIDER_SITE_OTHER): Payer: Medicare Other

## 2018-03-04 VITALS — BP 124/72 | HR 73 | Temp 98.2°F | Resp 14 | Ht 69.0 in | Wt 167.4 lb

## 2018-03-04 DIAGNOSIS — Z0000 Encounter for general adult medical examination without abnormal findings: Secondary | ICD-10-CM | POA: Diagnosis not present

## 2018-03-04 NOTE — Progress Notes (Addendum)
Subjective:   Carl [name] is a 82 y.o. male who presents for an Initial Medicare Annual Wellness Visit.  Review of Systems  No ROS.  Medicare Wellness Visit. Additional risk factors are reflected in the social history.  Cardiac Risk Factors include: advanced age (>25men, >66 women);hypertension;male gender    Objective:    Today's Vitals   03/04/18 0833  BP: 124/72  Pulse: 73  Resp: 14  Temp: 98.2 F (36.8 C)  TempSrc: Oral  SpO2: 95%  Weight: 167 lb 6.4 oz (75.9 kg)  Height: 5\' 9"  (1.753 m)   Body mass index is 24.72 kg/m.  Advanced Directives 03/04/2018 01/05/2018 03/12/2017 03/12/2017 11/19/2016 10/23/2016 10/21/2016  Does Patient Have a Medical Advance Directive? Yes Yes No No Yes No No  Type of Paramedic of [place];Living will Living will - - [place];Living will - -  Does patient want to make changes to medical advance directive? No - Patient declined No - Patient declined - - - - -  Copy of [place] in Chart? No - copy requested - - - No - copy requested - -  Would patient like information on creating a medical advance directive? - - No - Patient declined - - No - Patient declined No - Patient declined    Current Medications (verified) Outpatient Encounter Medications as of 03/04/2018  Medication Sig  . acetaminophen (TYLENOL) 500 MG tablet Take 500 mg by mouth every 6 (six) hours as needed (for pain).   [name] amiodarone (PACERONE) 200 MG tablet Take 1 tablet (200 mg total) by mouth daily.  [name] apixaban (ELIQUIS) 2.5 MG TABS tablet Take 1 tablet (2.5 mg total) by mouth 2 (two) times daily.  . bicalutamide (CASODEX) 50 MG tablet Take 50 mg by mouth daily with breakfast.   . omeprazole (PRILOSEC) 20 MG capsule TAKE 1 CAPSULE BY MOUTH EVERY DAY  . rosuvastatin (CRESTOR) 40 MG tablet TAKE 1 TABLET BY MOUTH EVERY DAY  . tamsulosin (FLOMAX) 0.4 MG CAPS capsule Take 1 capsule (0.4 mg total) by mouth daily after  supper.  . [DISCONTINUED] rosuvastatin (CRESTOR) 40 MG tablet TAKE 1 TABLET BY MOUTH EVERY DAY   No facility-administered encounter medications on file as of 03/04/2018.     Allergies (verified) Patient has no known allergies.   History: Past Medical History:  Diagnosis Date  . AKI (acute kidney injury) ([place]) 01/01/2018  . Anemia   . Arthritis    arms  . Bladder calculus   . Chronic anxiety   . CKD (chronic kidney disease), stage III ([place])   . Community acquired pneumonia of right lower lobe of lung ([place]) 09/15/2017  . Coronary artery disease CARDIOLOGIST-  DR Loralie Champagne   a. CABG 2006 with  LIMA-LAD, SVG-Ramus, SVG-OM, seq SVG-PLV-PDA.  [name] GERD (gastroesophageal reflux disease)   . History of prostate cancer urologist-  dr Risa Grill   hx radioactive prostate seed implants 1997--  currently on Lupron injection  . Hyperlipidemia   . Hyperplasia of prostate without lower urinary tract symptoms (LUTS)   . Hypertension   . Ischemic cardiomyopathy    echo (01/13) 45-50% septal akinesis, mild MR/  echo  (01/16) ef 60-65%, mid-apical anteroseptal hypokinesis  . Lung nodule   . Moderate mitral regurgitation   . Orthostatic hypotension   . PAC (premature atrial contraction)   . PAF (paroxysmal atrial fibrillation) ([place])    a. prior DCCV 10/2016.  . Paroxysmal atrial flutter ([place])  a. dx 02/2017, started on amiodarone.  . S/P CABG x 5 04/04/2005   LIMA to LAD,  SVG to Ramus,  SVG to OM,  seqSVG to PLV and PDA  . Wears glasses    Past Surgical History:  Procedure Laterality Date  . CARDIAC CATHETERIZATION  04/02/2005   dr Doreatha Lew    Critical three-vessel coronary disease --  Essentially normal left ventricular function , ef 55%  . CARDIOVASCULAR STRESS TEST  05-01-2008  dr Doreatha Lew /  dr harding   Low risk nuclear study w/ no ischemia or scar but flattened septal motion/  ef 64%  . CARDIOVERSION N/A 11/19/2016   Procedure: CARDIOVERSION;  Surgeon: Jerline Pain, MD;  Location: [facility];  Service: Cardiovascular;  Laterality: N/A;  . COLONOSCOPY N/A 10/23/2016   Procedure: COLONOSCOPY;  Surgeon: Milus Banister, MD;  Location: [facility];  Service: Endoscopy;  Laterality: N/A;  . CORONARY ARTERY BYPASS GRAFT  04/04/2005   dr  Ricard Dillon   LIMA - LAD,  SVG - Ramus, SVG - OM,  seqSVG - PLV and PDA  . CYSTOSCOPY WITH LITHOLAPAXY N/A 05/07/2016   Procedure: CYSTOSCOPY WITH LITHOLAPAXY;  Surgeon: Rana Snare, MD;  Location: [facility];  Service: Urology;  Laterality: N/A;  . HOLMIUM LASER APPLICATION N/A 17/51/0258   Procedure: HOLMIUM LASER APPLICATION;  Surgeon: Rana Snare, MD;  [facility];  Service: Urology;  Laterality: N/A;  . INGUINAL HERNIA REPAIR Left 03-30-2005  dr Excell Seltzer   incarcerated   . RADIOACTIVE PROSTATE SEED IMPLANTS  1997  . TRANSTHORACIC ECHOCARDIOGRAM  08/24/2014   mid anteroseptal and distal septak hypokinetic,  mild focal basal LVH, ef 60-65%,  grade 1 diastolic dysfunction/  mild MR/  mild LAE/  mild dilated RV with normal RVSP/  trivial TR   Family History  Problem Relation Age of Onset  . Heart disease Mother   . Cancer Mother   . Cancer Father   . Cancer Brother    Social History   Socioeconomic History  . Marital status: Widowed    Spouse name: Not on file  . Number of children: 3  . Years of education: Not on file  . Highest education level: Not on file  Occupational History  . Occupation: retired  Scientific laboratory technician  . Financial resource strain: Not on file  . Food insecurity:    Worry: Not on file    Inability: Not on file  . Transportation needs:    Medical: Not on file    Non-medical: Not on file  Tobacco Use  . Smoking status: Former Smoker    Years: 2.00    Types: Cigarettes    Last attempt to quit: 07/28/1960    Years since quitting: 57.6  . Smokeless tobacco: Never Used  Substance and Sexual Activity  . Alcohol use: No  . Drug use: No  . Sexual activity: Never  Lifestyle    . Physical activity:    Days per week: 0 days    Minutes per session: Not on file  . Stress: Only a little  Relationships  . Social connections:    Talks on phone: Not on file    Gets together: Not on file    Attends religious service: Not on file    Active member of club or organization: Not on file    Attends meetings of clubs or organizations: Not on file    Relationship status: Not on file  Other Topics Concern  .  Not on file  Social History Narrative  . Not on file   Tobacco Counseling Counseling given: Not Answered   Clinical Intake:  Pre-visit preparation completed: Yes  Pain : No/denies pain     Nutritional Status: BMI of 19-24  Normal Diabetes: No  How often do you need to have someone help you when you read instructions, pamphlets, or other written materials from your doctor or pharmacy?: 1 - Never  Interpreter Needed?: No     Activities of Daily Living In your present state of health, do you have any difficulty performing the following activities: 03/04/2018 01/05/2018  Hearing? N N  Vision? N N  Difficulty concentrating or making decisions? N N  Walking or climbing stairs? N Y  Dressing or bathing? N N  Doing errands, shopping? Y Y  Comment States he does not drive Facilities manager and eating ? Y -  Comment Daughter assists with meal prep.  Self feeds. -  Using the Toilet? N -  In the past six months, have you accidently leaked urine? N -  Do you have problems with loss of bowel control? N -  Managing your Medications? Y -  Comment Daughter assists -  Managing your Finances? Y -  Comment Son assists -  Housekeeping or managing your Housekeeping? Y -  Comment Daughter assists -  Some recent data might be hidden     Immunizations and Health Maintenance Immunization History  Administered Date(s) Administered  . Influenza, High Dose Seasonal PF 05/25/2017  . Influenza-Unspecified 06/01/2014  . Pneumococcal Polysaccharide-23 01/06/2018  . Tdap  01/11/2018   Health Maintenance Due  Topic Date Due  . INFLUENZA VACCINE  02/25/2018    Patient Care Team: [name], MD as PCP - General (Family Medicine)  Indicate any recent Medical Services you may have received from other than [facility] providers in the past year (date may be approximate).    Assessment:   This is a routine wellness examination for Carl [name].  The goal of the wellness visit is to assist the patient how to close the gaps in care and create a preventative care plan for the patient.   The roster of all physicians providing medical care to patient is listed in the Snapshot section of the chart.  Osteoporosis risk reviewed.    Safety issues reviewed; Lives with his daughter and granddaughter. Smoke and carbon monoxide detectors in the home. No firearms in the home. Wears seatbelts when riding with others. No violence in the home.  They do not have excessive sun exposure.  Discussed the need for sun protection: hats, long sleeves and the use of sunscreen if there is significant sun exposure.  Patient is alert, normal appearance, oriented to person/place/and time. Correctly identified the president of the [place] and recalls of 2/3 words.Performs simple calculations and can read correct time from watch face. Displays appropriate judgement.  No new identified risk were noted.  No failures at ADL's or IADL's.    BMI- discussed the importance of a healthy diet, water intake and the benefits of aerobic exercise. He has a good appetite and healthy diet with adequate water intake. He plans to walk for exercise. Educational material provided.   24 hour diet recall: Regular diet  Dental- dentures  Sleep patterns- Sleeps without issues through the night. Wakes feeling rested.    Health maintenance gaps- closed.  Patient Concerns: None at this time. Follow up with PCP as needed.  Hearing/Vision screen  Visual Acuity Screening  Right eye Left eye Both eyes    Without correction: 20/63 20/100 20/63  With correction:   20/25  Comments: Wears corrective lenses    Hearing Screening Comments: Patient is able to hear conversational tones without difficulty.  No issues reported.   Dietary issues and exercise activities discussed: Current Exercise Habits: The patient does not participate in regular exercise at present  Goals    . Increase physical activity     Walk for exercise      Depression Screen PHQ 2/9 Scores 03/04/2018 03/24/2017  PHQ - 2 Score 0 0    Fall Risk Fall Risk  03/04/2018 03/24/2017  Falls in the past year? No No   Cognitive Function:     6CIT Screen 03/04/2018  What Year? 0 points  What month? 0 points  What time? 0 points  Count back from 20 0 points  Months in reverse 0 points  Repeat phrase 0 points  Total Score 0    Screening Tests Health Maintenance  Topic Date Due  . INFLUENZA VACCINE  02/25/2018  . PNA vac Low Risk Adult (2 of 2 - PCV13) 01/07/2019  . TETANUS/TDAP  01/12/2028      Plan:    End of life planning; Advance aging; Advanced directives discussed. Copy of current HCPOA/Living Will requested.    I have personally reviewed and noted the following in the patient's chart:   . Medical and social history . Use of alcohol, tobacco or illicit drugs  . Current medications and supplements . Functional ability and status . Nutritional status . Physical activity . Advanced directives . List of other physicians . Hospitalizations, surgeries, and ER visits in previous 12 months . Vitals . Screenings to include cognitive, depression, and falls . Referrals and appointments  In addition, I have reviewed and discussed with patient certain preventive protocols, quality metrics, and best practice recommendations. A written personalized care plan for preventive services as well as general preventive health recommendations were provided to patient.     Varney Biles, LPN   0/03/4075   Agree  with above Dr. Olivia Mackie [name]

## 2018-03-04 NOTE — Patient Instructions (Addendum)
  Carl [name] , Thank you for taking time to come for your Medicare Wellness Visit. I appreciate your ongoing commitment to your health goals. Please review the following plan we discussed and let me know if I can assist you in the future.   Follow up as needed.    Bring a copy of your [place] and/or Living Will to be scanned into chart.  Have a great day!  These are the goals we discussed: Goals    . Increase physical activity     Walk for exercise       This is a list of the screening recommended for you and due dates:  Health Maintenance  Topic Date Due  . Flu Shot  02/25/2018  . Pneumonia vaccines (2 of 2 - PCV13) 01/07/2019  . Tetanus Vaccine  01/12/2028

## 2018-03-17 ENCOUNTER — Other Ambulatory Visit: Payer: Self-pay | Admitting: Cardiovascular Disease

## 2018-04-23 ENCOUNTER — Ambulatory Visit (INDEPENDENT_AMBULATORY_CARE_PROVIDER_SITE_OTHER): Payer: Medicare Other | Admitting: Family Medicine

## 2018-04-23 ENCOUNTER — Encounter: Payer: Self-pay | Admitting: Family Medicine

## 2018-04-23 VITALS — BP 112/68 | HR 78 | Temp 98.1°F | Ht 69.0 in | Wt 168.7 lb

## 2018-04-23 DIAGNOSIS — K635 Polyp of colon: Secondary | ICD-10-CM

## 2018-04-23 DIAGNOSIS — Z8546 Personal history of malignant neoplasm of prostate: Secondary | ICD-10-CM

## 2018-04-23 DIAGNOSIS — E785 Hyperlipidemia, unspecified: Secondary | ICD-10-CM

## 2018-04-23 DIAGNOSIS — I481 Persistent atrial fibrillation: Secondary | ICD-10-CM | POA: Diagnosis not present

## 2018-04-23 DIAGNOSIS — I4819 Other persistent atrial fibrillation: Secondary | ICD-10-CM

## 2018-04-23 DIAGNOSIS — Z23 Encounter for immunization: Secondary | ICD-10-CM | POA: Diagnosis not present

## 2018-04-23 DIAGNOSIS — N201 Calculus of ureter: Secondary | ICD-10-CM

## 2018-04-23 DIAGNOSIS — J309 Allergic rhinitis, unspecified: Secondary | ICD-10-CM

## 2018-04-23 MED ORDER — FLUTICASONE PROPIONATE 50 MCG/ACT NA SUSP: 2.0000 | Freq: Every day | NASAL | 6 refills | Status: DC

## 2018-04-23 NOTE — Progress Notes (Signed)
[name], MD Phone: [phone]  Carl [name] is a 82 y.o. male who presents today for f/u.  CC: afib, HLD, history of prostate cancer, history of colon polyps, allergic rhinitis  A. fib: Patient is taking Eliquis only once a day.  He believes this is what he was told previously.  No palpitations or bleeding issues.  Hyperlipidemia: Taking Crestor.  No abdominal pain, myalgias, or chest pain.  History of prostate cancer/kidney stones: He sees urology next month.  No blood in his urine.  No dysuria.  No frequency.  Notes his urinary flow is good.  History of colon polyp: Patient was supposed to follow-up with GI for repeat colonoscopy.  He is no longer on iron supplementation.  He notes no blood in his stool.  He would like to follow-up with somebody local.  Allergic rhinitis: He notes intermittent issues with rhinorrhea and sneezing.  Not on any medication.  Patient's son is taking over organizing the patient's care.  Social History   Tobacco Use  Smoking Status Former Smoker  . Years: 2.00  . Types: Cigarettes  . Last attempt to quit: 07/28/1960  . Years since quitting: 57.7  Smokeless Tobacco Never Used     ROS see history of present illness  Objective  Physical Exam Vitals:   04/23/18 1112  BP: 112/68  Pulse: 78  Temp: 98.1 F (36.7 C)  SpO2: 97%    BP Readings from Last 3 Encounters:  04/23/18 112/68  03/04/18 124/72  01/11/18 122/76   Wt Readings from Last 3 Encounters:  04/23/18 168 lb 11.2 oz (76.5 kg)  03/04/18 167 lb 6.4 oz (75.9 kg)  01/11/18 165 lb 6.4 oz (75 kg)    Physical Exam  Constitutional: No distress.  HENT:  Head: Normocephalic and atraumatic.  Right Ear: Tympanic membrane and ear canal normal.  Left Ear: Tympanic membrane and ear canal normal.  Mouth/Throat: Oropharynx is clear and moist.  Eyes: Pupils are equal, round, and reactive to light. Conjunctivae are normal.  Cardiovascular: Normal rate, regular rhythm and  normal heart sounds.  Pulmonary/Chest: Effort normal and breath sounds normal.  Musculoskeletal: He exhibits no edema.  Neurological: He is alert.  Skin: Skin is warm and dry. He is not diaphoretic.     Assessment/Plan: Please see individual problem list.  History of prostate cancer Patient will continue to follow with urology.  Patient recently had a PET scan ordered through urology.  Possibly be 2 focal areas of uptake within his brain.  I discussed proceeding with MRI to confirm that these were noncancerous lesions.  Advised that there was a potential that could be cancerous lesions.  The patient opted against any further evaluation for this at this time.  He will discuss with his urologist.  Urolithiasis Continue to see urology.  Colon polyps Referral placed to local gastroenterologist.  Persistent atrial fibrillation ([place]) Sinus rhythm today.  I discussed that he needs to take his Eliquis twice daily to help prevent strokes.  Him and his son voiced understanding.  Dyslipidemia Continue current regimen.  Allergic rhinitis Patient will trial Flonase.  I discussed the fine subpleural reticular opacities at the lung bases that were described as nonspecific.  Discussed that this could represent atelectasis though could also represent other lung disease.  Discussed obtaining a CT scan to evaluate further though the patient declined.  Orders Placed This Encounter  Procedures  . Flu vaccine HIGH DOSE PF (Fluzone High dose)  . Ambulatory referral to Gastroenterology  Referral Priority:   Routine    Referral Type:   Consultation    Referral Reason:   Specialty Services Required    Number of Visits Requested:   1    Meds ordered this encounter  Medications  . fluticasone (FLONASE) 50 MCG/ACT nasal spray    Sig: Place 2 sprays into both nostrils daily.    Dispense:  16 g    Refill:  [place], MD [place]

## 2018-04-23 NOTE — Patient Instructions (Signed)
Nice to see you. Your Eliquis is supposed to be twice daily dosing. Please continue to see urology. We will get you to see GI. Please try the Flonase for your allergies.

## 2018-04-26 DIAGNOSIS — J309 Allergic rhinitis, unspecified: Secondary | ICD-10-CM | POA: Insufficient documentation

## 2018-04-26 NOTE — Assessment & Plan Note (Signed)
Sinus rhythm today.  I discussed that he needs to take his Eliquis twice daily to help prevent strokes.  Him and his son voiced understanding.

## 2018-04-26 NOTE — Assessment & Plan Note (Signed)
Continue current regimen

## 2018-04-26 NOTE — Assessment & Plan Note (Signed)
Continue to see urology.

## 2018-04-26 NOTE — Assessment & Plan Note (Signed)
Patient will trial Flonase 

## 2018-04-26 NOTE — Assessment & Plan Note (Addendum)
Referral placed to local gastroenterologist.

## 2018-04-26 NOTE — Assessment & Plan Note (Addendum)
Patient will continue to follow with urology.  Patient recently had a PET scan ordered through urology.  Possibly be 2 focal areas of uptake within his brain.  I discussed proceeding with MRI to confirm that these were noncancerous lesions.  Advised that there was a potential that could be cancerous lesions.  The patient opted against any further evaluation for this at this time.  He will discuss with his urologist.

## 2018-05-08 ENCOUNTER — Other Ambulatory Visit: Payer: Self-pay | Admitting: Physician Assistant

## 2018-05-17 ENCOUNTER — Other Ambulatory Visit: Payer: Self-pay | Admitting: Family Medicine

## 2018-05-17 NOTE — Telephone Encounter (Signed)
Copied from [place] [id number]. Topic: Quick Communication - Rx Refill/Question >> May 17, 2018 12:53 PM Blase Mess A wrote: Medication: apixaban (ELIQUIS) 2.5 MG TABS tablet [[id number]]   Has the patient contacted their pharmacy? Yes  (Agent: If no, request that the patient contact the pharmacy for the refill.) (Agent: If yes, when and what did the pharmacy advise?)  Preferred Pharmacy (with phone number or street name): [id number] - [place], [place] - [address] [place] [address] Phone: [phone] Fax: [phone]    Agent: Please be advised that RX refills may take up to 3 business days. We ask that you follow-up with your pharmacy.

## 2018-05-18 MED ORDER — APIXABAN 2.5 MG PO TABS: 2.5000 mg | ORAL_TABLET | Freq: Two times a day (BID) | ORAL | 0 refills | Status: DC

## 2018-05-18 NOTE — Telephone Encounter (Signed)
Requested Prescriptions  Pending Prescriptions Disp Refills  . apixaban (ELIQUIS) 2.5 MG TABS tablet 60 tablet 0    Sig: Take 1 tablet (2.5 mg total) by mouth 2 (two) times daily.     Hematology:  Anticoagulants Passed - 05/17/2018  2:45 PM      Passed - HGB in normal range and within 360 days    Hemoglobin  Date Value Ref Range Status  01/11/2018 13.2 13.0 - 17.0 g/dL Final  02/19/2017 11.3 (L) 13.0 - 17.7 g/dL Final         Passed - PLT in normal range and within 360 days    Platelets  Date Value Ref Range Status  01/11/2018 185.0 150.0 - 400.0 K/uL Final  02/19/2017 226 150 - 379 x10E3/uL Final         Passed - HCT in normal range and within 360 days    HCT  Date Value Ref Range Status  01/11/2018 39.1 39.0 - 52.0 % Final   Hematocrit  Date Value Ref Range Status  02/19/2017 35.5 (L) 37.5 - 51.0 % Final         Passed - Cr in normal range and within 360 days    Creat  Date Value Ref Range Status  09/04/2016 1.20 (H) 0.70 - 1.18 mg/dL Final    Comment:      For patients > or = 80 years of age: The upper reference limit for Creatinine is approximately 13% higher for people identified as African-American.      Creatinine, Ser  Date Value Ref Range Status  01/11/2018 1.30 0.40 - 1.50 mg/dL Final         Passed - Valid encounter within last 12 months    Recent Outpatient Visits          3 weeks ago Needs flu shot   [facility] [name], MD   4 months ago Complicated UTI (urinary tract infection)   [place] [name], MD   4 months ago Frequent urination   [place] [name], MD   8 months ago Community acquired pneumonia of right lower lobe of lung [facility])   [place] [name], MD   8 months ago Community acquired pneumonia, unspecified laterality   [place], [place], [place]      Future Appointments         In 3 months [name], [name], MD [facility], [place]   In 11 months O'Brien-Blaney, Bryson Corona, LPN [place], [place]   In 11 months [name], [name], MD [facility], [facility]

## 2018-05-24 ENCOUNTER — Telehealth: Payer: Self-pay | Admitting: Gastroenterology

## 2018-05-24 NOTE — Telephone Encounter (Signed)
error 

## 2018-06-11 ENCOUNTER — Other Ambulatory Visit: Payer: Self-pay | Admitting: Physician Assistant

## 2018-06-12 ENCOUNTER — Other Ambulatory Visit: Payer: Self-pay | Admitting: Family Medicine

## 2018-06-18 ENCOUNTER — Ambulatory Visit (INDEPENDENT_AMBULATORY_CARE_PROVIDER_SITE_OTHER): Payer: Medicare Other | Admitting: Internal Medicine

## 2018-06-18 ENCOUNTER — Encounter: Payer: Self-pay | Admitting: Internal Medicine

## 2018-06-18 ENCOUNTER — Ambulatory Visit
Admission: RE | Admit: 2018-06-18 | Discharge: 2018-06-18 | Disposition: A | Discharge status: Home / Self Care | Payer: Medicare Other | Source: Ambulatory Visit | Attending: Internal Medicine | Admitting: Internal Medicine

## 2018-06-18 VITALS — BP 126/90 | HR 82 | Temp 97.8°F | Ht 69.0 in | Wt 163.2 lb

## 2018-06-18 DIAGNOSIS — K635 Polyp of colon: Secondary | ICD-10-CM

## 2018-06-18 DIAGNOSIS — R739 Hyperglycemia, unspecified: Secondary | ICD-10-CM

## 2018-06-18 DIAGNOSIS — R109 Unspecified abdominal pain: Secondary | ICD-10-CM | POA: Diagnosis present

## 2018-06-18 DIAGNOSIS — R918 Other nonspecific abnormal finding of lung field: Secondary | ICD-10-CM

## 2018-06-18 DIAGNOSIS — R911 Solitary pulmonary nodule: Secondary | ICD-10-CM

## 2018-06-18 DIAGNOSIS — R1033 Periumbilical pain: Secondary | ICD-10-CM

## 2018-06-18 DIAGNOSIS — R630 Anorexia: Secondary | ICD-10-CM | POA: Diagnosis present

## 2018-06-18 DIAGNOSIS — R6889 Other general symptoms and signs: Secondary | ICD-10-CM

## 2018-06-18 DIAGNOSIS — R634 Abnormal weight loss: Secondary | ICD-10-CM

## 2018-06-18 DIAGNOSIS — I4891 Unspecified atrial fibrillation: Secondary | ICD-10-CM

## 2018-06-18 DIAGNOSIS — Z8546 Personal history of malignant neoplasm of prostate: Secondary | ICD-10-CM

## 2018-06-18 DIAGNOSIS — Z951 Presence of aortocoronary bypass graft: Secondary | ICD-10-CM

## 2018-06-18 DIAGNOSIS — Z1389 Encounter for screening for other disorder: Secondary | ICD-10-CM

## 2018-06-18 DIAGNOSIS — I25810 Atherosclerosis of coronary artery bypass graft(s) without angina pectoris: Secondary | ICD-10-CM

## 2018-06-18 LAB — POCT I-STAT CREATININE: Creatinine, Ser: 1.8 mg/dL — ABNORMAL HIGH (ref 0.61–1.24)

## 2018-06-18 MED ORDER — OMEPRAZOLE 40 MG PO CPDR: 40.0000 mg | DELAYED_RELEASE_CAPSULE | Freq: Every day | ORAL | 1 refills | Status: DC

## 2018-06-18 MED ORDER — APIXABAN 2.5 MG PO TABS: 2.5000 mg | ORAL_TABLET | Freq: Two times a day (BID) | ORAL | 0 refills | Status: DC

## 2018-06-18 MED ORDER — ROSUVASTATIN CALCIUM 40 MG PO TABS: 40.0000 mg | ORAL_TABLET | Freq: Every day | ORAL | 3 refills | Status: DC

## 2018-06-18 MED ORDER — IOPAMIDOL (ISOVUE-300) INJECTION 61%
80.0000 mL | Freq: Once | INTRAVENOUS | Status: AC | PRN
  Administered 2018-06-18: 80 mL via INTRAVENOUS

## 2018-06-18 NOTE — Progress Notes (Signed)
Pre visit review using our clinic review tool, if applicable. No additional management support is needed unless otherwise documented below in the visit note. 

## 2018-06-18 NOTE — Progress Notes (Signed)
Chief Complaint  Patient presents with  . Follow-up   Acute visit with daughter [name] who currently lives with him  1. C/o umbilical abdominal pain x 1 week, reduced appetite and weight loss.  Food makes ab pain better but does not feel like eating, denies nausea/diarrhea. Stool is yellow/tan and he reports increased urination  2. Abnormal renal US 12/2017 with atherosclerosis, kidney stones and lung lesions rec CT chest. Pt never smoker  3. H/o prostate cancer needs refill of Casodex advised to get from urology/H/o getting injections per his daughter q62 months and h/o prostate seeds    Review of Systems  Constitutional: Positive for weight loss.  HENT: Negative for hearing loss.   Eyes: Negative for blurred vision.  Respiratory: Negative for shortness of breath.   Cardiovascular: Negative for chest pain.  Gastrointestinal: Positive for abdominal pain. Negative for diarrhea, nausea and vomiting.       +reduced appetite    Genitourinary: Positive for frequency.  Musculoskeletal: Negative for falls.  Skin: Negative for rash.  Neurological: Positive for weakness.   Past Medical History:  Diagnosis Date  . AKI (acute kidney injury) ([place]) 01/01/2018  . Anemia   . Arthritis    arms  . Bladder calculus   . Chronic anxiety   . CKD (chronic kidney disease), stage III ([place])   . Community acquired pneumonia of right lower lobe of lung ([place]) 09/15/2017  . Coronary artery disease CARDIOLOGIST-  DR Loralie Champagne   a. CABG 2006 with  LIMA-LAD, SVG-Ramus, SVG-OM, seq SVG-PLV-PDA.  [name] GERD (gastroesophageal reflux disease)   . History of prostate cancer urologist-  dr Risa Grill   hx radioactive prostate seed implants 1997--  currently on Lupron injection  . Hyperlipidemia   . Hyperplasia of prostate without lower urinary tract symptoms (LUTS)   . Hypertension   . Ischemic cardiomyopathy    echo (01/13) 45-50% septal akinesis, mild MR/  echo  (01/16) ef 60-65%, mid-apical anteroseptal hypokinesis   . Lung nodule   . Moderate mitral regurgitation   . Orthostatic hypotension   . PAC (premature atrial contraction)   . PAF (paroxysmal atrial fibrillation) ([place])    a. prior DCCV 10/2016.  . Paroxysmal atrial flutter ([place])    a. dx 02/2017, started on amiodarone.  . S/P CABG x 5 04/04/2005   LIMA to LAD,  SVG to Ramus,  SVG to OM,  seqSVG to PLV and PDA  . Wears glasses    Past Surgical History:  Procedure Laterality Date  . CARDIAC CATHETERIZATION  04/02/2005   dr Doreatha Lew    Critical three-vessel coronary disease --  Essentially normal left ventricular function , ef 55%  . CARDIOVASCULAR STRESS TEST  05-01-2008  dr Doreatha Lew /  dr harding   Low risk nuclear study w/ no ischemia or scar but flattened septal motion/  ef 64%  . CARDIOVERSION N/A 11/19/2016   Procedure: CARDIOVERSION;  Surgeon: Jerline Pain, MD;  Location: [place];  Service: Cardiovascular;  Laterality: N/A;  . COLONOSCOPY N/A 10/23/2016   Procedure: COLONOSCOPY;  Surgeon: Milus Banister, MD;  Location: [facility];  Service: Endoscopy;  Laterality: N/A;  . CORONARY ARTERY BYPASS GRAFT  04/04/2005   dr  Ricard Dillon   LIMA - LAD,  SVG - Ramus, SVG - OM,  seqSVG - PLV and PDA  . CYSTOSCOPY WITH LITHOLAPAXY N/A 05/07/2016   Procedure: CYSTOSCOPY WITH LITHOLAPAXY;  Surgeon: Rana Snare, MD;  Location: [facility];  Service: Urology;  Laterality: N/A;  .  HOLMIUM LASER APPLICATION N/A 75/64/3329   Procedure: HOLMIUM LASER APPLICATION;  Surgeon: Rana Snare, MD;  Location: [facility];  Service: Urology;  Laterality: N/A;  . INGUINAL HERNIA REPAIR Left 03-30-2005  dr Excell Seltzer   incarcerated   . RADIOACTIVE PROSTATE SEED IMPLANTS  1997  . TRANSTHORACIC ECHOCARDIOGRAM  08/24/2014   mid anteroseptal and distal septak hypokinetic,  mild focal basal LVH, ef 60-65%,  grade 1 diastolic dysfunction/  mild MR/  mild LAE/  mild dilated RV with normal RVSP/  trivial TR   Family History  Problem Relation  Age of Onset  . Heart disease Mother   . Cancer Mother   . Cancer Father   . Cancer Brother    Social History   Socioeconomic History  . Marital status: Widowed    Spouse name: Not on file  . Number of children: 3  . Years of education: Not on file  . Highest education level: Not on file  Occupational History  . Occupation: retired  Scientific laboratory technician  . Financial resource strain: Not on file  . Food insecurity:    Worry: Not on file    Inability: Not on file  . Transportation needs:    Medical: Not on file    Non-medical: Not on file  Tobacco Use  . Smoking status: Former Smoker    Years: 2.00    Types: Cigarettes    Last attempt to quit: 07/28/1960    Years since quitting: 57.9  . Smokeless tobacco: Never Used  Substance and Sexual Activity  . Alcohol use: No  . Drug use: No  . Sexual activity: Never  Lifestyle  . Physical activity:    Days per week: 0 days    Minutes per session: Not on file  . Stress: Only a little  Relationships  . Social connections:    Talks on phone: Not on file    Gets together: Not on file    Attends religious service: Not on file    Active member of club or organization: Not on file    Attends meetings of clubs or organizations: Not on file    Relationship status: Not on file  . Intimate partner violence:    Fear of current or ex partner: Not on file    Emotionally abused: Not on file    Physically abused: Not on file    Forced sexual activity: Not on file  Other Topics Concern  . Not on file  Social History Narrative   Widowed lives with daughter [name]   Current Meds  Medication Sig  . acetaminophen (TYLENOL) 500 MG tablet Take 500 mg by mouth every 6 (six) hours as needed (for pain).   [name] amiodarone (PACERONE) 200 MG tablet Take 1 tablet (200 mg total) by mouth daily. Please schedule appt for future refills. 2nd attempt  . apixaban (ELIQUIS) 2.5 MG TABS tablet Take 1 tablet (2.5 mg total) by mouth 2 (two) times daily.  .  bicalutamide (CASODEX) 50 MG tablet Take 50 mg by mouth daily with breakfast.   . calcium carbonate (OS-CAL - DOSED IN MG OF ELEMENTAL CALCIUM) 1250 (500 Ca) MG tablet Take 1 tablet by mouth.  . fluticasone (FLONASE) 50 MCG/ACT nasal spray Place 2 sprays into both nostrils daily.  [name] omeprazole (PRILOSEC) 40 MG capsule Take 1 capsule (40 mg total) by mouth daily.  . rosuvastatin (CRESTOR) 40 MG tablet Take 1 tablet (40 mg total) by mouth daily.  . tamsulosin (FLOMAX) 0.4 MG  CAPS capsule Take 1 capsule (0.4 mg total) by mouth daily after supper.  . [DISCONTINUED] ELIQUIS 2.5 MG TABS tablet TAKE 1 TABLET BY MOUTH TWICE A DAY  . [DISCONTINUED] omeprazole (PRILOSEC) 20 MG capsule TAKE 1 CAPSULE BY MOUTH EVERY DAY  . [DISCONTINUED] rosuvastatin (CRESTOR) 40 MG tablet TAKE 1 TABLET BY MOUTH EVERY DAY   No Known Allergies No results found for this or any previous visit (from the past 2160 hour(s)). Objective  Body mass index is 24.1 kg/m. Wt Readings from Last 3 Encounters:  06/18/18 163 lb 3.2 oz (74 kg)  04/23/18 168 lb 11.2 oz (76.5 kg)  03/04/18 167 lb 6.4 oz (75.9 kg)   Temp Readings from Last 3 Encounters:  06/18/18 97.8 F (36.6 C) (Oral)  04/23/18 98.1 F (36.7 C) (Oral)  03/04/18 98.2 F (36.8 C) (Oral)   BP Readings from Last 3 Encounters:  06/18/18 126/90  04/23/18 112/68  03/04/18 124/72   Pulse Readings from Last 3 Encounters:  06/18/18 82  04/23/18 78  03/04/18 73    Physical Exam  Constitutional: He is oriented to person, place, and time. Vital signs are normal. He appears well-developed and well-nourished. He is cooperative.  HENT:  Head: Normocephalic and atraumatic.  Mouth/Throat: Oropharynx is clear and moist and mucous membranes are normal.  Eyes: Pupils are equal, round, and reactive to light. Conjunctivae are normal.  Cardiovascular: Normal rate.  Afib  Pulmonary/Chest: Effort normal and breath sounds normal.  Abdominal: Soft. Bowel sounds are normal. He  exhibits no distension. There is no tenderness.  Neurological: He is alert and oriented to person, place, and time. Gait normal.  Slowed gait    Skin: Skin is warm, dry and intact.  Psychiatric: He has a normal mood and affect. His speech is normal and behavior is normal. Judgment and thought content normal. Cognition and memory are normal.  Nursing note and vitals reviewed.   Assessment   1. Weight loss, loss of appetite, umbilical ab pain r/o malignancy with colonoscopy 2018 with tubular/tubularvillous polyps with dysplasia 2. Abnormal CT rec CT chest no h/o smoking and c/w #1  3. Colon polyps  4. H/o prostate cancer with seeds  5. Afib rate controlled  Plan   1. Will CT ab/pelvis and chest  Labs today CMET, CBC, TSH, UA (pt unsure if able to urinate), A1C h/o hyperglycemia  Refer to [facility]  GI wants to go local for EGD/colonoscopy  Increase ppi 20 to 40 mg qd  Prn Tylenol  rec bland diet/shakes given 2 strawberry ensures today  Disc premier protein shake  2. See above  3. See #1  4. Refill of casodex from urology or h/o  Injections q4 months  5. Refilled eliquis  Amiodarone rec pt contact cards   Provider: Dr. Olivia Mackie [name]-Internal Medicine

## 2018-06-18 NOTE — Patient Instructions (Addendum)
Try Premier Protein shake, bland diet and broth  You can try Tylenol as needed for abdominal pain  Referring to [place]   Call cardiology for amiodarone refills  Call urology or cancer doctor for casodex refills     Abdominal Pain, Adult Abdominal pain can be caused by many things. Often, abdominal pain is not serious and it gets better with no treatment or by being treated at home. However, sometimes abdominal pain is serious. Your health care provider will do a medical history and a physical exam to try to determine the cause of your abdominal pain. Follow these instructions at home:  Take over-the-counter and prescription medicines only as told by your health care provider. Do not take a laxative unless told by your health care provider.  Drink enough fluid to keep your urine clear or pale yellow.  Watch your condition for any changes.  Keep all follow-up visits as told by your health care provider. This is important. Contact a health care provider if:  Your abdominal pain changes or gets worse.  You are not hungry or you lose weight without trying.  You are constipated or have diarrhea for more than 2-3 days.  You have pain when you urinate or have a bowel movement.  Your abdominal pain wakes you up at night.  Your pain gets worse with meals, after eating, or with certain foods.  You are throwing up and cannot keep anything down.  You have a fever. Get help right away if:  Your pain does not go away as soon as your health care provider told you to expect.  You cannot stop throwing up.  Your pain is only in areas of the abdomen, such as the right side or the left lower portion of the abdomen.  You have bloody or black stools, or stools that look like tar.  You have severe pain, cramping, or bloating in your abdomen.  You have signs of dehydration, such as: ? Dark urine, very little urine, or no urine. ? Cracked lips. ? Dry mouth. ? Sunken  eyes. ? Sleepiness. ? Weakness. This information is not intended to replace advice given to you by your health care provider. Make sure you discuss any questions you have with your health care provider. Document Released: 04/23/2005 Document Revised: 02/01/2016 Document Reviewed: 12/26/2015 Elsevier Interactive Patient Education  2018 [employer].   Colon Polyps Polyps are tissue growths inside the body. Polyps can grow in many places, including the large intestine (colon). A polyp may be a round bump or a mushroom-shaped growth. You could have one polyp or several. Most colon polyps are noncancerous (benign). However, some colon polyps can become cancerous over time. What are the causes? The exact cause of colon polyps is not known. What increases the risk? This condition is more likely to develop in people who:  Have a family history of colon cancer or colon polyps.  Are older than 34 or older than 45 if they are African American.  Have inflammatory bowel disease, such as ulcerative colitis or Crohn disease.  Are overweight.  Smoke cigarettes.  Do not get enough exercise.  Drink too much alcohol.  Eat a diet that is: ? High in fat and red meat. ? Low in fiber.  Had childhood cancer that was treated with abdominal radiation.  What are the signs or symptoms? Most polyps do not cause symptoms. If you have symptoms, they may include:  Blood coming from your rectum when having a  bowel movement.  Blood in your stool.The stool may look dark red or black.  A change in bowel habits, such as constipation or diarrhea.  How is this diagnosed? This condition is diagnosed with a colonoscopy. This is a procedure that uses a lighted, flexible scope to look at the inside of your colon. How is this treated? Treatment for this condition involves removing any polyps that are found. Those polyps will then be tested for cancer. If cancer is found, your health care provider will talk to  you about options for colon cancer treatment. Follow these instructions at home: Diet  Eat plenty of fiber, such as fruits, vegetables, and whole grains.  Eat foods that are high in calcium and vitamin D, such as milk, cheese, yogurt, eggs, liver, fish, and broccoli.  Limit foods high in fat, red meats, and processed meats, such as hot dogs, sausage, bacon, and lunch meats.  Maintain a healthy weight, or lose weight if recommended by your health care provider. General instructions  Do not smoke cigarettes.  Do not drink alcohol excessively.  Keep all follow-up visits as told by your health care provider. This is important. This includes keeping regularly scheduled colonoscopies. Talk to your health care provider about when you need a colonoscopy.  Exercise every day or as told by your health care provider. Contact a health care provider if:  You have new or worsening bleeding during a bowel movement.  You have new or increased blood in your stool.  You have a change in bowel habits.  You unexpectedly lose weight. This information is not intended to replace advice given to you by your health care provider. Make sure you discuss any questions you have with your health care provider. Document Released: 04/09/2004 Document Revised: 12/20/2015 Document Reviewed: 06/04/2015 Elsevier Interactive Patient Education  2018 [employer].  Failure to Thrive, Adult Failure to thrive is a group of symptoms that affect elderly adults. These symptoms include loss of appetite and weight loss. People who have this condition may do fewer and fewer activities over time. They may lose interest in being with friends or they may not want to eat or drink. This condition is not a normal part of aging. What are the causes? This condition may be caused by:  A disease, such dementia, diabetes, cancer, or lung disease.  A health problem, such as a vitamin deficiency or a heart problem.  A disorder, such  as depression.  A disability.  Medicines.  Mistreatment or neglect.  In some cases, the cause may not be known. What are the signs or symptoms? Symptoms of this condition include:  Loss of more than 5% of your body weight.  Being more tired than normal after an activity.  Having trouble getting up after sitting.  Loss of appetite.  Not getting out of bed.  Not wanting to do usual activities.  Depression.  Getting infections often.  Bedsores.  Taking a long time to recover after an injury or a surgery.  Weakness.  How is this diagnosed? This condition may be diagnosed with a physical exam. Your health care provider will ask questions about your health, behavior, and mood, such as:  Has your activity changed?  Do you seem sad?  Are your eating habits different?  Tests may also be done. They may include:  Blood tests.  Urine tests.  Imaging tests, such as X-rays, a CT scan, or MRI.  Hearing tests.  Vision tests.  Tests to check thinking ability (cognitive  tests).  Activity tests to see if you can do tasks such as bathing and dressing and to see if you can move around safely.  You may be referred to a specialist. How is this treated? Treatment for this condition depends on the cause. It may involve:  Treating the cause.  Talk therapy or medicine to treat depression.  Improving diet, such as by eating more often or taking nutritional supplements.  Changing or stopping a medicine.  Physical therapy.  It often takes a team of health care providers to find the right treatment. Follow these instructions at home:  Take over-the-counter and prescription medicines only as told by your health care provider.  Eat a healthy, well-balanced diet. Make sure to get enough calories in each meal.  Be physically active. Include strength training as part of your exercise routine. A physical therapist can help to set up an exercise program that fits  you.  Make sure that you are safe at home.  Make sure that you have a plan for what to do if you become unable to make decisions for yourself. Contact a health care provider if:  You are not able to eat well.  You are not able to move around.  You feel very sad or hopeless. Get help right away if:  You have thoughts of ending your life.  You cannot eat or drink.  You do not get out of bed.  Staying at home is no longer safe.  You have a fever. This information is not intended to replace advice given to you by your health care provider. Make sure you discuss any questions you have with your health care provider. Document Released: 10/06/2011 Document Revised: 12/20/2015 Document Reviewed: 10/09/2014 Elsevier Interactive Patient Education  [employer].

## 2018-06-19 LAB — CBC WITH DIFFERENTIAL/PLATELET
Basophils Absolute: 30 cells/uL (ref 0–200)
Basophils Relative: 0.4 %
Eosinophils Absolute: 23 cells/uL (ref 15–500)
Eosinophils Relative: 0.3 %
HCT: 39.6 % (ref 38.5–50.0)
Hemoglobin: 13.6 g/dL (ref 13.2–17.1)
Lymphs Abs: 1110 cells/uL (ref 850–3900)
MCH: 31.9 pg (ref 27.0–33.0)
MCHC: 34.3 g/dL (ref 32.0–36.0)
MCV: 92.7 fL (ref 80.0–100.0)
MPV: 10.6 fL (ref 7.5–12.5)
Monocytes Relative: 8.8 %
Neutro Abs: 5768 cells/uL (ref 1500–7800)
Neutrophils Relative %: 75.9 %
Platelets: 185 10*3/uL (ref 140–400)
RBC: 4.27 10*6/uL (ref 4.20–5.80)
RDW: 12.9 % (ref 11.0–15.0)
Total Lymphocyte: 14.6 %
WBC mixed population: 669 cells/uL (ref 200–950)
WBC: 7.6 10*3/uL (ref 3.8–10.8)

## 2018-06-19 LAB — COMPREHENSIVE METABOLIC PANEL
AG Ratio: 2 (calc) (ref 1.0–2.5)
ALT: 14 U/L (ref 9–46)
AST: 16 U/L (ref 10–35)
Albumin: 4.3 g/dL (ref 3.6–5.1)
Alkaline phosphatase (APISO): 85 U/L (ref 40–115)
BUN/Creatinine Ratio: 14 (calc) (ref 6–22)
BUN: 23 mg/dL (ref 7–25)
CO2: 21 mmol/L (ref 20–32)
Calcium: 9 mg/dL (ref 8.6–10.3)
Chloride: 105 mmol/L (ref 98–110)
Creat: 1.68 mg/dL — ABNORMAL HIGH (ref 0.70–1.11)
Globulin: 2.1 g/dL (calc) (ref 1.9–3.7)
Glucose, Bld: 111 mg/dL — ABNORMAL HIGH (ref 65–99)
Potassium: 3.9 mmol/L (ref 3.5–5.3)
Sodium: 137 mmol/L (ref 135–146)
Total Bilirubin: 0.6 mg/dL (ref 0.2–1.2)
Total Protein: 6.4 g/dL (ref 6.1–8.1)

## 2018-06-19 LAB — URINALYSIS, ROUTINE W REFLEX MICROSCOPIC
Bilirubin Urine: NEGATIVE
Glucose, UA: NEGATIVE
Hyaline Cast: NONE SEEN /LPF
Nitrite: POSITIVE — AB
Specific Gravity, Urine: 1.017 (ref 1.001–1.03)
Squamous Epithelial / LPF: NONE SEEN /HPF (ref <=5)
WBC, UA: >=60 /HPF — AB (ref 0–5)
pH: 5.5 (ref 5.0–8.0)

## 2018-06-19 LAB — HEMOGLOBIN A1C
Hgb A1c MFr Bld: 5.2 % of total Hgb (ref <5.7)
Mean Plasma Glucose: 103 (calc)
eAG (mmol/L): 5.7 (calc)

## 2018-06-19 LAB — TSH: TSH: 1.03 mIU/L (ref 0.40–4.50)

## 2018-06-20 ENCOUNTER — Other Ambulatory Visit: Payer: Self-pay | Admitting: Internal Medicine

## 2018-06-20 DIAGNOSIS — N200 Calculus of kidney: Secondary | ICD-10-CM

## 2018-06-20 MED ORDER — OXYCODONE-ACETAMINOPHEN 5-325 MG PO TABS: 1.0000 | ORAL_TABLET | Freq: Two times a day (BID) | ORAL | 0 refills | Status: DC | PRN

## 2018-06-20 MED ORDER — CIPROFLOXACIN HCL 500 MG PO TABS: 500.0000 mg | ORAL_TABLET | Freq: Two times a day (BID) | ORAL | 0 refills | Status: DC

## 2018-06-30 ENCOUNTER — Encounter: Payer: Self-pay | Admitting: Internal Medicine

## 2018-06-30 ENCOUNTER — Ambulatory Visit (INDEPENDENT_AMBULATORY_CARE_PROVIDER_SITE_OTHER): Payer: Medicare Other | Admitting: Internal Medicine

## 2018-06-30 VITALS — BP 122/66 | HR 76 | Temp 97.7°F | Ht 69.0 in | Wt 168.4 lb

## 2018-06-30 DIAGNOSIS — N200 Calculus of kidney: Secondary | ICD-10-CM

## 2018-06-30 DIAGNOSIS — R109 Unspecified abdominal pain: Secondary | ICD-10-CM

## 2018-06-30 MED ORDER — OXYCODONE-ACETAMINOPHEN 5-325 MG PO TABS: 1.0000 | ORAL_TABLET | Freq: Two times a day (BID) | ORAL | 0 refills | Status: DC | PRN

## 2018-06-30 NOTE — Progress Notes (Signed)
Pre visit review using our clinic review tool, if applicable. No additional management support is needed unless otherwise documented below in the visit note. 

## 2018-06-30 NOTE — Progress Notes (Signed)
Note and ct faxed

## 2018-06-30 NOTE — Progress Notes (Signed)
Chief Complaint  Patient presents with  . Follow-up   F/u w/ grandson and grandsons GF 1. Abdominal and flank pain CT +kidney stones multiple size 1-2.3 cm pain 8-9/10 established with [facility] would like refill of pain medication   Review of Systems  Constitutional: Negative for fever.  HENT: Negative for hearing loss.   Eyes: Negative for blurred vision.  Cardiovascular: Negative for chest pain.  Gastrointestinal: Positive for abdominal pain.       Appetite increased    Genitourinary:       +b/l flank pain    Neurological:       +abnormal gait slowed    Past Medical History:  Diagnosis Date  . AKI (acute kidney injury) ([place]) 01/01/2018  . Anemia   . Arthritis    arms  . Bladder calculus   . Chronic anxiety   . CKD (chronic kidney disease), stage III ([place])   . Community acquired pneumonia of right lower lobe of lung ([place]) 09/15/2017  . Coronary artery disease CARDIOLOGIST-  DR Loralie Champagne   a. CABG 2006 with  LIMA-LAD, SVG-Ramus, SVG-OM, seq SVG-PLV-PDA.  [name] GERD (gastroesophageal reflux disease)   . History of prostate cancer urologist-  dr Risa Grill   hx radioactive prostate seed implants 1997--  currently on Lupron injection  . Hyperlipidemia   . Hyperplasia of prostate without lower urinary tract symptoms (LUTS)   . Hypertension   . Ischemic cardiomyopathy    echo (01/13) 45-50% septal akinesis, mild MR/  echo  (01/16) ef 60-65%, mid-apical anteroseptal hypokinesis  . Lung nodule   . Moderate mitral regurgitation   . Orthostatic hypotension   . PAC (premature atrial contraction)   . PAF (paroxysmal atrial fibrillation) ([place])    a. prior DCCV 10/2016.  . Paroxysmal atrial flutter ([place])    a. dx 02/2017, started on amiodarone.  . S/P CABG x 5 04/04/2005   LIMA to LAD,  SVG to Ramus,  SVG to OM,  seqSVG to PLV and PDA  . Wears glasses    Past Surgical History:  Procedure Laterality Date  . CARDIAC CATHETERIZATION  04/02/2005   dr Doreatha Lew    Critical  three-vessel coronary disease --  Essentially normal left ventricular function , ef 55%  . CARDIOVASCULAR STRESS TEST  05-01-2008  dr Doreatha Lew /  dr harding   Low risk nuclear study w/ no ischemia or scar but flattened septal motion/  ef 64%  . CARDIOVERSION N/A 11/19/2016   Procedure: CARDIOVERSION;  Surgeon: Jerline Pain, MD;  Location: [facility];  Service: Cardiovascular;  Laterality: N/A;  . COLONOSCOPY N/A 10/23/2016   Procedure: COLONOSCOPY;  Surgeon: Milus Banister, MD;  Location: [facility];  Service: Endoscopy;  Laterality: N/A;  . CORONARY ARTERY BYPASS GRAFT  04/04/2005   dr  Ricard Dillon   LIMA - LAD,  SVG - Ramus, SVG - OM,  seqSVG - PLV and PDA  . CYSTOSCOPY WITH LITHOLAPAXY N/A 05/07/2016   Procedure: CYSTOSCOPY WITH LITHOLAPAXY;  Surgeon: Rana Snare, MD;  Location[facility];  Service: Urology;  Laterality: N/A;  . HOLMIUM LASER APPLICATION N/A 23/55/7322   Procedure: HOLMIUM LASER APPLICATION;  Surgeon: Rana Snare, MD;  Location: [facility];  Service: Urology;  Laterality: N/A;  . INGUINAL HERNIA REPAIR Left 03-30-2005  dr Excell Seltzer   incarcerated   . RADIOACTIVE PROSTATE SEED IMPLANTS  1997  . TRANSTHORACIC ECHOCARDIOGRAM  08/24/2014   mid anteroseptal and distal septak hypokinetic,  mild focal basal LVH,  ef 60-65%,  grade 1 diastolic dysfunction/  mild MR/  mild LAE/  mild dilated RV with normal RVSP/  trivial TR   Family History  Problem Relation Age of Onset  . Heart disease Mother   . Cancer Mother   . Cancer Father   . Cancer Brother    Social History   Socioeconomic History  . Marital status: Widowed    Spouse name: Not on file  . Number of children: 3  . Years of education: Not on file  . Highest education level: Not on file  Occupational History  . Occupation: retired  Scientific laboratory technician  . Financial resource strain: Not on file  . Food insecurity:    Worry: Not on file    Inability: Not on file  . Transportation needs:     Medical: Not on file    Non-medical: Not on file  Tobacco Use  . Smoking status: Former Smoker    Years: 2.00    Types: Cigarettes    Last attempt to quit: 07/28/1960    Years since quitting: 57.9  . Smokeless tobacco: Never Used  Substance and Sexual Activity  . Alcohol use: No  . Drug use: No  . Sexual activity: Never  Lifestyle  . Physical activity:    Days per week: 0 days    Minutes per session: Not on file  . Stress: Only a little  Relationships  . Social connections:    Talks on phone: Not on file    Gets together: Not on file    Attends religious service: Not on file    Active member of club or organization: Not on file    Attends meetings of clubs or organizations: Not on file    Relationship status: Not on file  . Intimate partner violence:    Fear of current or ex partner: Not on file    Emotionally abused: Not on file    Physically abused: Not on file    Forced sexual activity: Not on file  Other Topics Concern  . Not on file  Social History Narrative   Widowed lives with daughter [name]   Current Meds  Medication Sig  . acetaminophen (TYLENOL) 500 MG tablet Take 500 mg by mouth every 6 (six) hours as needed (for pain).   [name] amiodarone (PACERONE) 200 MG tablet Take 1 tablet (200 mg total) by mouth daily. Please schedule appt for future refills. 2nd attempt  . apixaban (ELIQUIS) 2.5 MG TABS tablet Take 1 tablet (2.5 mg total) by mouth 2 (two) times daily.  . bicalutamide (CASODEX) 50 MG tablet Take 50 mg by mouth daily with breakfast.   . calcium carbonate (OS-CAL - DOSED IN MG OF ELEMENTAL CALCIUM) 1250 (500 Ca) MG tablet Take 1 tablet by mouth.  . fluticasone (FLONASE) 50 MCG/ACT nasal spray Place 2 sprays into both nostrils daily.  [name] omeprazole (PRILOSEC) 40 MG capsule Take 1 capsule (40 mg total) by mouth daily.  . rosuvastatin (CRESTOR) 40 MG tablet Take 1 tablet (40 mg total) by mouth daily.  . tamsulosin (FLOMAX) 0.4 MG CAPS capsule Take 1 capsule (0.4 mg  total) by mouth daily after supper.   No Known Allergies Recent Results (from the past 2160 hour(s))  Comprehensive metabolic panel     Status: Abnormal   Collection Time: 06/18/18  4:11 PM  Result Value Ref Range   Glucose, Bld 111 (H) 65 - 99 mg/dL    Comment: .  Fasting reference interval . For someone without known diabetes, a glucose value between 100 and 125 mg/dL is consistent with prediabetes and should be confirmed with a follow-up test. .    BUN 23 7 - 25 mg/dL   Creat 1.68 (H) 0.70 - 1.11 mg/dL    Comment: For patients >66 years of age, the reference limit for Creatinine is approximately 13% higher for people identified as African-American. .    BUN/Creatinine Ratio 14 6 - 22 (calc)   Sodium 137 135 - 146 mmol/L   Potassium 3.9 3.5 - 5.3 mmol/L   Chloride 105 98 - 110 mmol/L   CO2 21 20 - 32 mmol/L   Calcium 9.0 8.6 - 10.3 mg/dL   Total Protein 6.4 6.1 - 8.1 g/dL   Albumin 4.3 3.6 - 5.1 g/dL   Globulin 2.1 1.9 - 3.7 g/dL (calc)   AG Ratio 2.0 1.0 - 2.5 (calc)   Total Bilirubin 0.6 0.2 - 1.2 mg/dL   Alkaline phosphatase (APISO) 85 40 - 115 U/L   AST 16 10 - 35 U/L   ALT 14 9 - 46 U/L  CBC with Differential/Platelet     Status: None   Collection Time: 06/18/18  4:11 PM  Result Value Ref Range   WBC 7.6 3.8 - 10.8 Thousand/uL   RBC 4.27 4.20 - 5.80 Million/uL   Hemoglobin 13.6 13.2 - 17.1 g/dL   HCT 39.6 38.5 - 50.0 %   MCV 92.7 80.0 - 100.0 fL   MCH 31.9 27.0 - 33.0 pg   MCHC 34.3 32.0 - 36.0 g/dL   RDW 12.9 11.0 - 15.0 %   Platelets 185 140 - 400 Thousand/uL   MPV 10.6 7.5 - 12.5 fL   Neutro Abs 5,768 1,500 - 7,800 cells/uL   Lymphs Abs 1,110 850 - 3,900 cells/uL   WBC mixed population 669 200 - 950 cells/uL   Eosinophils Absolute 23 15 - 500 cells/uL   Basophils Absolute 30 0 - 200 cells/uL   Neutrophils Relative % 75.9 %   Total Lymphocyte 14.6 %   Monocytes Relative 8.8 %   Eosinophils Relative 0.3 %   Basophils Relative 0.4 %  TSH      Status: None   Collection Time: 06/18/18  4:11 PM  Result Value Ref Range   TSH 1.03 0.40 - 4.50 mIU/L  Urinalysis, Routine w reflex microscopic     Status: Abnormal   Collection Time: 06/18/18  4:11 PM  Result Value Ref Range   Color, Urine DARK YELLOW YELLOW   APPearance TURBID (A) CLEAR   Specific Gravity, Urine 1.017 1.001 - 1.03   pH 5.5 5.0 - 8.0   Glucose, UA NEGATIVE NEGATIVE   Bilirubin Urine NEGATIVE NEGATIVE   Ketones, ur TRACE (A) NEGATIVE   Hgb urine dipstick 3+ (A) NEGATIVE   Protein, ur 2+ (A) NEGATIVE   Nitrite POSITIVE (A) NEGATIVE   Leukocytes, UA 3+ (A) NEGATIVE   WBC, UA > OR = 60 (A) 0 - 5 /HPF   RBC / HPF 40-60 (A) 0 - 2 /HPF   Squamous Epithelial / LPF NONE SEEN < OR = 5 /HPF   Bacteria, UA MANY (A) NONE SEEN /HPF   Hyaline Cast NONE SEEN NONE SEEN /LPF  Hemoglobin A1c     Status: None   Collection Time: 06/18/18  4:11 PM  Result Value Ref Range   Hgb A1c MFr Bld 5.2 <5.7 % of total Hgb    Comment: For the purpose  of screening for the presence of diabetes: . <5.7%       Consistent with the absence of diabetes 5.7-6.4%    Consistent with increased risk for diabetes             (prediabetes) > or =6.5%  Consistent with diabetes . This assay result is consistent with a decreased risk of diabetes. . Currently, no consensus exists regarding use of hemoglobin A1c for diagnosis of diabetes in children. . According to American Diabetes Association (ADA) guidelines, hemoglobin A1c <7.0% represents optimal control in non-pregnant diabetic patients. Different metrics may apply to specific patient populations.  Standards of Medical Care in Diabetes(ADA). .    Mean Plasma Glucose 103 (calc)   eAG (mmol/L) 5.7 (calc)  I-STAT creatinine     Status: Abnormal   Collection Time: 06/18/18  6:25 PM  Result Value Ref Range   Creatinine, Ser 1.80 (H) 0.61 - 1.24 mg/dL   Objective  Body mass index is 24.87 kg/m. Wt Readings from Last 3 Encounters:   06/30/18 168 lb 6.4 oz (76.4 kg)  06/18/18 163 lb 3.2 oz (74 kg)  04/23/18 168 lb 11.2 oz (76.5 kg)   Temp Readings from Last 3 Encounters:  06/30/18 97.7 F (36.5 C) (Oral)  06/18/18 97.8 F (36.6 C) (Oral)  04/23/18 98.1 F (36.7 C) (Oral)   BP Readings from Last 3 Encounters:  06/30/18 122/66  06/18/18 126/90  04/23/18 112/68   Pulse Readings from Last 3 Encounters:  06/30/18 76  06/18/18 82  04/23/18 78    Physical Exam  Constitutional: He is oriented to person, place, and time. Vital signs are normal. He appears well-developed and well-nourished. He is cooperative.  HENT:  Head: Normocephalic and atraumatic.  Mouth/Throat: Oropharynx is clear and moist and mucous membranes are normal.  Eyes: Pupils are equal, round, and reactive to light. Conjunctivae are normal.  Cardiovascular: Normal rate, regular rhythm and normal heart sounds.  Pulmonary/Chest: Effort normal and breath sounds normal.  Abdominal: There is CVA tenderness.  B/l mild to mod cva ttp  Neurological: He is alert and oriented to person, place, and time. Gait normal.  Skin: Skin is warm, dry and intact.  Psychiatric: He has a normal mood and affect. His speech is normal and behavior is normal. Judgment and thought content normal. Cognition and memory are normal.  Nursing note and vitals reviewed.   Assessment   1. Abdominal and flank pain b/l likely 2/2y Kidney stones multiple  Plan  1. Called [facility] sch appt today at 1:45  Refilled percocet today  Cont flomax  Increase lemon water intake  Provider: Dr. Olivia Mackie [name]-Internal Medicine

## 2018-06-30 NOTE — Patient Instructions (Signed)
Drink plenty of lemon water  Follow up with [facility]   Dietary Guidelines to Help Prevent Kidney Stones Kidney stones are deposits of minerals and salts that form inside your kidneys. Your risk of developing kidney stones may be greater depending on your diet, your lifestyle, the medicines you take, and whether you have certain medical conditions. Most people can reduce their chances of developing kidney stones by following the instructions below. Depending on your overall health and the type of kidney stones you tend to develop, your dietitian may give you more specific instructions. What are tips for following this plan? Reading food labels  Choose foods with "no salt added" or "low-salt" labels. Limit your sodium intake to less than 1500 mg per day.  Choose foods with calcium for each meal and snack. Try to eat about 300 mg of calcium at each meal. Foods that contain 200-500 mg of calcium per serving include: ? 8 oz (237 ml) of milk, fortified nondairy milk, and fortified fruit juice. ? 8 oz (237 ml) of kefir, yogurt, and soy yogurt. ? 4 oz (118 ml) of tofu. ? 1 oz of cheese. ? 1 cup (300 g) of dried figs. ? 1 cup (91 g) of cooked broccoli. ? 1-3 oz can of sardines or mackerel.  Most people need 1000 to 1500 mg of calcium each day. Talk to your dietitian about how much calcium is recommended for you. Shopping  Buy plenty of fresh fruits and vegetables. Most people do not need to avoid fruits and vegetables, even if they contain nutrients that may contribute to kidney stones.  When shopping for convenience foods, choose: ? Whole pieces of fruit. ? Premade salads with dressing on the side. ? Low-fat fruit and yogurt smoothies.  Avoid buying frozen meals or prepared deli foods.  Look for foods with live cultures, such as yogurt and kefir. Cooking  Do not add salt to food when cooking. Place a salt shaker on the table and allow each person to add his or her own salt to  taste.  Use vegetable protein, such as beans, textured vegetable protein (TVP), or tofu instead of meat in pasta, casseroles, and soups. Meal planning  Eat less salt, if told by your dietitian. To do this: ? Avoid eating processed or premade food. ? Avoid eating fast food.  Eat less animal protein, including cheese, meat, poultry, or fish, if told by your dietitian. To do this: ? Limit the number of times you have meat, poultry, fish, or cheese each week. Eat a diet free of meat at least 2 days a week. ? Eat only one serving each day of meat, poultry, fish, or seafood. ? When you prepare animal protein, cut pieces into small portion sizes. For most meat and fish, one serving is about the size of one deck of cards.  Eat at least 5 servings of fresh fruits and vegetables each day. To do this: ? Keep fruits and vegetables on hand for snacks. ? Eat 1 piece of fruit or a handful of berries with breakfast. ? Have a salad and fruit at lunch. ? Have two kinds of vegetables at dinner.  Limit foods that are high in a substance called oxalate. These include: ? Spinach. ? Rhubarb. ? Beets. ? Potato chips and french fries. ? Nuts.  If you regularly take a diuretic medicine, make sure to eat at least 1-2 fruits or vegetables high in potassium each day. These include: ? Avocado. ? Banana. ? Orange, prune, carrot,  or tomato juice. ? Baked potato. ? Cabbage. ? Beans and split peas. General instructions  Drink enough fluid to keep your urine clear or pale yellow. This is the most important thing you can do.  Talk to your health care provider and dietitian about taking daily supplements. Depending on your health and the cause of your kidney stones, you may be advised: ? Not to take supplements with vitamin C. ? To take a calcium supplement. ? To take a daily probiotic supplement. ? To take other supplements such as magnesium, fish oil, or vitamin B6.  Take all medicines and supplements as  told by your health care provider.  Limit alcohol intake to no more than 1 drink a day for nonpregnant women and 2 drinks a day for men. One drink equals 12 oz of beer, 5 oz of wine, or 1 oz of hard liquor.  Lose weight if told by your health care provider. Work with your dietitian to find strategies and an eating plan that works best for you. What foods are not recommended? Limit your intake of the following foods, or as told by your dietitian. Talk to your dietitian about specific foods you should avoid based on the type of kidney stones and your overall health. Grains Breads. Bagels. Rolls. Baked goods. Salted crackers. Cereal. Pasta. Vegetables Spinach. Rhubarb. Beets. Canned vegetables. Carl [name]. Olives. Meats and other protein foods Nuts. Nut butters. Large portions of meat, poultry, or fish. Salted or cured meats. Deli meats. Hot dogs. Sausages. Dairy Cheese. Beverages Regular soft drinks. Regular vegetable juice. Seasonings and other foods Seasoning blends with salt. Salad dressings. Canned soups. Soy sauce. Ketchup. Barbecue sauce. Canned pasta sauce. Casseroles. Pizza. Lasagna. Frozen meals. Potato chips. [place] fries. Summary  You can reduce your risk of kidney stones by making changes to your diet.  The most important thing you can do is drink enough fluid. You should drink enough fluid to keep your urine clear or pale yellow.  Ask your health care provider or dietitian how much protein from animal sources you should eat each day, and also how much salt and calcium you should have each day. This information is not intended to replace advice given to you by your health care provider. Make sure you discuss any questions you have with your health care provider. Document Released: 11/08/2010 Document Revised: 06/24/2016 Document Reviewed: 06/24/2016 Elsevier Interactive Patient Education  2018 [employer].  Kidney Stones Kidney stones (urolithiasis) are solid, rock-like  deposits that form inside of the organs that make urine (kidneys). A kidney stone may form in a kidney and move into the bladder, where it can cause intense pain and block the flow of urine. Kidney stones are created when high levels of certain minerals are found in the urine. They are usually passed through urination, but in some cases, medical treatment may be needed to remove them. What are the causes? Kidney stones may be caused by:  A condition in which certain glands produce too much parathyroid hormone (primary hyperparathyroidism), which causes too much calcium buildup in the blood.  Buildup of uric acid crystals in the bladder (hyperuricosuria). Uric acid is a chemical that the body produces when you eat certain foods. It usually exits the body in the urine.  Narrowing (stricture) of one or both of the tubes that drain urine from the kidneys to the bladder (ureters).  A kidney blockage that is present at birth (congenital obstruction).  Past surgery on the kidney or the ureters, such as  gastric bypass surgery.  What increases the risk? The following factors make you more likely to develop kidney stones:  Having had a kidney stone in the past.  Having a family history of kidney stones.  Not drinking enough water.  Eating a diet that is high in protein, salt (sodium), or sugar.  Being overweight or obese.  What are the signs or symptoms? Symptoms of a kidney stone may include:  Nausea.  Vomiting.  Blood in the urine (hematuria).  Pain in the side of the abdomen, right below the ribs (flank pain). Pain usually spreads (radiates) to the groin.  Needing to urinate frequently or urgently.  How is this diagnosed? This condition may be diagnosed based on:  Your medical history.  A physical exam.  Blood tests.  Urine tests.  CT scan.  Abdominal X-ray.  A procedure to examine the inside of the bladder (cystoscopy).  How is this treated? Treatment for kidney  stones depends on the size, location, and makeup of the stones. Treatment may involve:  Analyzing your urine before and after you pass the stone through urination.  Being monitored at the hospital until you pass the stone through urination.  Increasing your fluid intake and decreasing the amount of calcium and protein in your diet.  A procedure to break up kidney stones in the bladder using: ? A focused beam of light (laser therapy). ? Shock waves (extracorporeal shock wave lithotripsy).  Surgery to remove kidney stones. This may be needed if you have severe pain or have stones that block your urinary tract.  Follow these instructions at home: Eating and drinking   Drink enough fluid to keep your urine clear or pale yellow. This will help you to pass the kidney stone.  If directed, change your diet. This may include: ? Limiting how much sodium you eat. ? Eating more fruits and vegetables. ? Limiting how much meat, poultry, fish, and eggs you eat.  Follow instructions from your health care provider about eating or drinking restrictions. General instructions  Collect urine samples as told by your health care provider. You may need to collect a urine sample: ? 24 hours after you pass the stone. ? 8-12 weeks after passing the kidney stone, and every 6-12 months after that.  Strain your urine every time you urinate, for as long as directed. Use the strainer that your health care provider recommends.  Do not throw out the kidney stone after passing it. Keep the stone so it can be tested by your health care provider. Testing the makeup of your kidney stone may help prevent you from getting kidney stones in the future.  Take over-the-counter and prescription medicines only as told by your health care provider.  Keep all follow-up visits as told by your health care provider. This is important. You may need follow-up X-rays or ultrasounds to make sure that your stone has passed. How is  this prevented? To prevent another kidney stone:  Drink enough fluid to keep your urine clear or pale yellow. This is the best way to prevent kidney stones.  Eat a healthy diet and follow recommendations from your health care provider about foods to avoid. You may be instructed to eat a low-protein diet. Recommendations vary depending on the type of kidney stone that you have.  Maintain a healthy weight.  Contact a health care provider if:  You have pain that gets worse or does not get better with medicine. Get help right away if:  You have  a fever or chills.  You develop severe pain.  You develop new abdominal pain.  You faint.  You are unable to urinate. This information is not intended to replace advice given to you by your health care provider. Make sure you discuss any questions you have with your health care provider. Document Released: 07/14/2005 Document Revised: 02/01/2016 Document Reviewed: 12/28/2015 Elsevier Interactive Patient Education  [employer].

## 2018-07-01 LAB — URINALYSIS, ROUTINE W REFLEX MICROSCOPIC
Bilirubin Urine: NEGATIVE
Glucose, UA: NEGATIVE
Hyaline Cast: NONE SEEN /LPF
Ketones, ur: NEGATIVE
Nitrite: NEGATIVE
RBC / HPF: >=60 /HPF — AB (ref 0–2)
Specific Gravity, Urine: 1.018 (ref 1.001–1.03)
Squamous Epithelial / LPF: NONE SEEN /HPF (ref <=5)
WBC, UA: >=60 /HPF — AB (ref 0–5)
pH: 6 (ref 5.0–8.0)

## 2018-07-01 LAB — URINE CULTURE
ISOLATE 1:: NO GROWTH
MICRO NUMBER:: 91451959
SPECIMEN QUALITY:: ADEQUATE

## 2018-07-02 ENCOUNTER — Telehealth: Payer: Self-pay | Admitting: Family Medicine

## 2018-07-02 NOTE — Telephone Encounter (Signed)
Copied from [facility] [id number]. Topic: Quick Communication - Lab Results (Clinic Use ONLY) >> Jul 02, 2018  9:10 AM Babs Bertin, CMA wrote: Called patient to inform them of [phone] lab results. When patient returns call, triage nurse may disclose results.

## 2018-07-02 NOTE — Telephone Encounter (Signed)
Charted in result notes. 

## 2018-07-08 ENCOUNTER — Other Ambulatory Visit: Payer: Self-pay | Admitting: Physician Assistant

## 2018-07-11 NOTE — Progress Notes (Signed)
Cardiology Office Note Date:  07/12/2018  Patient ID:  Carl [name] [birth year], MRN [id number] PCP:  Carl Haven, MD  Cardiologist:  Dr. Rockey Situ, MD    Chief Complaint: Follow up  History of Present Illness: Carl [name] is a 82 y.o. male with history of CAD s/p 5-vessel CABG in 2006 with a LIMA to LAD, SVG to ramus, SVG to OM, sequential SVG to PLV-PDA, HFrEF secondary to ICM, PAF on Eliquis s/p DCCV in 10/2016, atrial flutter noted in 02/2017 in the setting of a UTI, moderate mitral regurgitation, abdominal aortic aneurysm, CKD stage III, renal/bladder stones, anemia of chronic disease, pulmonary nodule, HTN, HLD, orthostatic hypotension, and GERD who presents for follow up.   Patient was previously followed by Dr. Aundra Dubin. Echo from 2013 showed an EF of 45-50%, AK of the septal myocardium, Gr1DD, mild MR, moderately dilated LA, mildly dilated RA, mildly elevated PASP. Repeat echo in 2016 showed an improved EF to 60-65%, HK of the mid anteroseptal and distal septal wall, mild focal basal hypertrophy of the septum, Gr1DD, mild MR, mildly dilated LA, mildly dilated RV, trivial TR. Echo in 08/2016 showed an EF of 50-55%, moderate LVH, moderate MR, severe LAE, moderately dilated RV, PASP 62 mmHg. He was followed closely in 2018 due to his PAF s/p DCCV in 10/2016, with subsequent medication tapering secondary to orthostatic hypotension. He ultimately required discontinuation of beta blocker and Flomax with initiation of midodrine. In 02/2017, he was noted to be in new onset atrial flutter in the setting of a UTI. At that time he was also noted to have a newly noted pulmonary nodule measuring 7 mm most suggestive of granuloma. He was started on IV amiodarone for rate control and converted to NSR with PACs. Troponin peaked at 0.03.   He has since established with Dr. Rockey Situ, last being seen in 04/2017, and was doing well. He was noted to be relatively sedentary. He was maintaining sinus  rhythm and was continued on amiodarone and Eliquis.  Recent chest/abdomen/pelvis CT performed in 05/2018 for unintentional weight loss did not show a specific explanation for his weight loss with multiple bilateral renal stones noted as well as a 3.1 cm fusiform abdominal aortic aneurysm with recommendation for follow-up ultrasound in 3 years time.  Labs: 05/2018 - SCr 1.68-->1.80, K+ 3.9, LFT normal, CBC unremarkable, TSH normal  He comes in accompanied by his daughter today.  He reports he is feeling well and denies any symptoms of chest pain, shortness of breath, lower extremity swelling, abdominal distention, palpitations, presyncope, syncope, worsening orthopnea, PND, or early satiety.  He indicates he has slept in a recliner for many years and this is his baseline.  He does continue to note some positional dizziness though if he changes positions slowly this is improved.  He lives a relatively sedentary lifestyle.  He indicates he has lost approximately 8 pounds over the past 6 months with work-up being unrevealing.  There does seem to be some possible underlying dementia as the patient goes off on tangents regarding sinus congestion, colonoscopy, elevated and PSA during our visit today.   Past Medical History:  Diagnosis Date  . AKI (acute kidney injury) ([place]) 01/01/2018  . Anemia   . Arthritis    arms  . Bladder calculus   . Chronic anxiety   . CKD (chronic kidney disease), stage III ([place])   . Community acquired pneumonia of right lower lobe of lung ([place]) 09/15/2017  . Coronary artery disease CARDIOLOGIST-  DR Loralie Champagne   a. CABG 2006 with  LIMA-LAD, SVG-Ramus, SVG-OM, seq SVG-PLV-PDA.  [name] GERD (gastroesophageal reflux disease)   . History of prostate cancer urologist-  dr Carl [name]   hx radioactive prostate seed implants 1997--  currently on Lupron injection  . Hyperlipidemia   . Hyperplasia of prostate without lower urinary tract symptoms (LUTS)   . Hypertension   . Ischemic  cardiomyopathy    echo (01/13) 45-50% septal akinesis, mild MR/  echo  (01/16) ef 60-65%, mid-apical anteroseptal hypokinesis  . Lung nodule   . Moderate mitral regurgitation   . Orthostatic hypotension   . PAC (premature atrial contraction)   . PAF (paroxysmal atrial fibrillation) ([place])    a. prior DCCV 10/2016.  . Paroxysmal atrial flutter ([place])    a. dx 02/2017, started on amiodarone.  . S/P CABG x 5 04/04/2005   LIMA to LAD,  SVG to Ramus,  SVG to OM,  seqSVG to PLV and PDA  . Wears glasses     Past Surgical History:  Procedure Laterality Date  . CARDIAC CATHETERIZATION  04/02/2005   dr Doreatha Lew    Critical three-vessel coronary disease --  Essentially normal left ventricular function , ef 55%  . CARDIOVASCULAR STRESS TEST  05-01-2008  dr Doreatha Lew /  dr harding   Low risk nuclear study w/ no ischemia or scar but flattened septal motion/  ef 64%  . CARDIOVERSION N/A 11/19/2016   Procedure: CARDIOVERSION;  Surgeon: Jerline Pain, MD;  Location: [facility];  Service: Cardiovascular;  Laterality: N/A;  . COLONOSCOPY N/A 10/23/2016   Procedure: COLONOSCOPY;  Surgeon: Milus Banister, MD;  Location: [facility];  Service: Endoscopy;  Laterality: N/A;  . CORONARY ARTERY BYPASS GRAFT  04/04/2005   dr  Ricard Dillon   LIMA - LAD,  SVG - Ramus, SVG - OM,  seqSVG - PLV and PDA  . CYSTOSCOPY WITH LITHOLAPAXY N/A 05/07/2016   Procedure: CYSTOSCOPY WITH LITHOLAPAXY;  Surgeon: Rana Snare, MD;  Location: [facility];  Service: Urology;  Laterality: N/A;  . HOLMIUM LASER APPLICATION N/A 98/33/8250   Procedure: HOLMIUM LASER APPLICATION;  Surgeon: Rana Snare, MD;  Location: [facility];  Service: Urology;  Laterality: N/A;  . INGUINAL HERNIA REPAIR Left 03-30-2005  dr Excell Seltzer   incarcerated   . RADIOACTIVE PROSTATE SEED IMPLANTS  1997  . TRANSTHORACIC ECHOCARDIOGRAM  08/24/2014   mid anteroseptal and distal septak hypokinetic,  mild focal basal LVH, ef 60-65%,  grade 1  diastolic dysfunction/  mild MR/  mild LAE/  mild dilated RV with normal RVSP/  trivial TR    Current Meds  Medication Sig  . acetaminophen (TYLENOL) 500 MG tablet Take 500 mg by mouth every 6 (six) hours as needed (for pain).   [name] amiodarone (PACERONE) 200 MG tablet Take 1 tablet (200 mg total) by mouth daily. Please schedule appt for future refills. 3rd and final attempt!  [name] apixaban (ELIQUIS) 2.5 MG TABS tablet Take 1 tablet (2.5 mg total) by mouth 2 (two) times daily.  . bicalutamide (CASODEX) 50 MG tablet Take 50 mg by mouth daily with breakfast.   . omeprazole (PRILOSEC) 40 MG capsule Take 1 capsule (40 mg total) by mouth daily.  . rosuvastatin (CRESTOR) 40 MG tablet Take 1 tablet (40 mg total) by mouth daily.  . Sulfamethoxazole-Trimethoprim (SULFAMETHOXAZOLE-TMP DS PO) Take by mouth every 12 (twelve) hours.  . tamsulosin (FLOMAX) 0.4 MG CAPS capsule Take 1 capsule (0.4 mg total) by mouth daily  after supper.    Allergies:   Patient has no known allergies.   Social History:  The patient  reports that he quit smoking about 57 years ago. His smoking use included cigarettes. He quit after 2.00 years of use. He has never used smokeless tobacco. He reports that he does not drink alcohol or use drugs.   Family History:  The patient's family history includes Cancer in his brother, father, and mother; Heart disease in his mother.  ROS:   Review of Systems  Constitutional: Positive for malaise/fatigue and weight loss. Negative for chills, diaphoresis and fever.  HENT: Positive for congestion.   Eyes: Negative for discharge and redness.  Respiratory: Negative for cough, hemoptysis, sputum production, shortness of breath and wheezing.   Cardiovascular: Negative for chest pain, palpitations, orthopnea, claudication, leg swelling and PND.  Gastrointestinal: Negative for abdominal pain, blood in stool, heartburn, melena, nausea and vomiting.  Genitourinary: Negative for hematuria.    Musculoskeletal: Negative for falls and myalgias.  Skin: Negative for rash.  Neurological: Positive for weakness. Negative for dizziness, tingling, tremors, sensory change, speech change, focal weakness and loss of consciousness.  Endo/Heme/Allergies: Does not bruise/bleed easily.  Psychiatric/Behavioral: Negative for substance abuse. The patient is not nervous/anxious.   All other systems reviewed and are negative.    PHYSICAL EXAM:  VS:  BP 120/64 (BP Location: Left Arm, Patient Position: Sitting, Cuff Size: Normal)   Pulse 78   Ht 5' 10.5" (1.791 m)   Wt 163 lb 12 oz (74.3 kg)   BMI 23.16 kg/m  BMI: Body mass index is 23.16 kg/m.  Physical Exam  Constitutional: He is oriented to person, place, and time. He appears well-developed and well-nourished.  HENT:  Head: Normocephalic and atraumatic.  Eyes: Right eye exhibits no discharge. Left eye exhibits no discharge.  Neck: Normal range of motion. No JVD present.  Cardiovascular: Normal rate, regular rhythm, S1 normal and S2 normal. Exam reveals no distant heart sounds, no friction rub, no midsystolic click and no opening snap.  Murmur heard. High-pitched blowing holosystolic murmur is present with a grade of 1/6 at the apex. Pulses:      Posterior tibial pulses are 2+ on the right side and 2+ on the left side.  Pulmonary/Chest: Effort normal and breath sounds normal. No respiratory distress. He has no decreased breath sounds. He has no wheezes. He has no rales. He exhibits no tenderness.  Abdominal: Soft. He exhibits no distension. There is no abdominal tenderness.  Musculoskeletal:        General: No edema.  Neurological: He is alert and oriented to person, place, and time.  Skin: Skin is warm and dry. No cyanosis. Nails show no clubbing.  Psychiatric: He has a normal mood and affect. His speech is normal and behavior is normal. Judgment and thought content normal.     EKG:  Was ordered and interpreted by me today. Shows NSR,  78 bpm, first-degree AV block, baseline artifact, no acute ST-T changes (unchanged from prior)  Recent Labs: 06/18/2018: ALT 14; BUN 23; Creatinine, Ser 1.80; Hemoglobin 13.6; Platelets 185; Potassium 3.9; Sodium 137; TSH 1.03  No results found for requested labs within last 8760 hours.   CrCl cannot be calculated (Patient's most recent lab result is older than the maximum 21 days allowed.).   Wt Readings from Last 3 Encounters:  07/12/18 163 lb 12 oz (74.3 kg)  06/30/18 168 lb 6.4 oz (76.4 kg)  06/18/18 163 lb 3.2 oz (74 kg)  Other studies reviewed: Additional studies/records reviewed today include: summarized above  ASSESSMENT AND PLAN:  1. CAD status post CABG without angina: He is doing well without any symptoms concerning for angina.  He is on Eliquis in place of aspirin.  Not on beta-blocker as outlined below.  Remains on Crestor.  Aggressive secondary prevention.  No plans for ischemic evaluation at this time.  2. HFrEF secondary to ICM: He does not appear grossly volume overloaded at this time.  Not currently on evidence-based heart failure therapy including beta-blocker, ACE inhibitor/ARB/spironolactone/Entresto given relative hypotension and orthostasis that has previously required de-escalation of medications.  Obtain echo as below.  3. Persistent A. Fib/flutter: Maintaining sinus rhythm today.  Continue amiodarone 200 mg daily for rhythm control.  Remains on Eliquis 2.5 mg daily given his age and renal function.  Should his renal function be improving on recheck today we will need to calculate his creatinine clearance to evaluate if he would be better suited on Xarelto.  Recent liver function and thyroid function normal.  4. Mitral regurgitation: Check echo.  Given the patient's advanced age and comorbid conditions with possible underlying dementia it remains unclear if he would be a candidate for invasive repair.  5. AAA: Incidentally noted on CT in 05/2018.  Radiology had  recommended follow-up abdominal ultrasound in 3 years time.  Discussion with patient and daughter regarding precautions including weight limitations, risk factor modification, and avoidance of fluoroquinolones.  6. Pulmonary hypertension: Appears stable and asymptomatic.  Check echocardiogram as above.  If pulmonary arterial pressure is trending upwards would refer to pulmonology.  It appears patient has only smoked for approximately 2 to 3 years and this was 60 years prior.  7. Acute on CKD stage III: Check BMP.  8. Hypertension: Blood pressure is well controlled today.  Continue current medications.  Followed by PCP.  9. Hyperlipidemia: Most recent lipid panel from 02/2017 showed an LDL of 48.  Check lipid panel.  Remains on Crestor.   Disposition: F/u with Dr. Rockey Situ or an APP in 3 months.  Current medicines are reviewed at length with the patient today.  The patient did not have any concerns regarding medicines.  Signed, [name], PA-C 07/12/2018 11:03 AM     [facility] [address] [place], [place] [address] [phone]

## 2018-07-12 ENCOUNTER — Encounter: Payer: Self-pay | Admitting: Physician Assistant

## 2018-07-12 ENCOUNTER — Ambulatory Visit: Payer: Medicare Other | Admitting: Physician Assistant

## 2018-07-12 VITALS — BP 120/64 | HR 78 | Ht 70.5 in | Wt 163.8 lb

## 2018-07-12 DIAGNOSIS — I10 Essential (primary) hypertension: Secondary | ICD-10-CM | POA: Diagnosis not present

## 2018-07-12 DIAGNOSIS — I714 Abdominal aortic aneurysm, without rupture, unspecified: Secondary | ICD-10-CM

## 2018-07-12 DIAGNOSIS — I272 Pulmonary hypertension, unspecified: Secondary | ICD-10-CM

## 2018-07-12 DIAGNOSIS — I4819 Other persistent atrial fibrillation: Secondary | ICD-10-CM

## 2018-07-12 DIAGNOSIS — Z951 Presence of aortocoronary bypass graft: Secondary | ICD-10-CM | POA: Diagnosis not present

## 2018-07-12 DIAGNOSIS — I340 Nonrheumatic mitral (valve) insufficiency: Secondary | ICD-10-CM

## 2018-07-12 DIAGNOSIS — I2510 Atherosclerotic heart disease of native coronary artery without angina pectoris: Secondary | ICD-10-CM

## 2018-07-12 DIAGNOSIS — N179 Acute kidney failure, unspecified: Secondary | ICD-10-CM

## 2018-07-12 DIAGNOSIS — N189 Chronic kidney disease, unspecified: Secondary | ICD-10-CM

## 2018-07-12 DIAGNOSIS — I5022 Chronic systolic (congestive) heart failure: Secondary | ICD-10-CM | POA: Diagnosis not present

## 2018-07-12 DIAGNOSIS — E785 Hyperlipidemia, unspecified: Secondary | ICD-10-CM

## 2018-07-12 DIAGNOSIS — N183 Chronic kidney disease, stage 3 unspecified: Secondary | ICD-10-CM

## 2018-07-12 MED ORDER — AMIODARONE HCL 200 MG PO TABS: 200.0000 mg | ORAL_TABLET | Freq: Every day | ORAL | 3 refills | Status: DC

## 2018-07-12 MED ORDER — APIXABAN 2.5 MG PO TABS: 2.5000 mg | ORAL_TABLET | Freq: Two times a day (BID) | ORAL | 3 refills | Status: DC

## 2018-07-12 NOTE — Patient Instructions (Addendum)
   Medication Instructions:  Your physician recommends that you continue on your current medications as directed. Please refer to the Current Medication list given to you today.  If you need a refill on your cardiac medications before your next appointment, please call your pharmacy.   Lab work: Your physician recommends that you return for lab work today.   If you have labs (blood work) drawn today and your tests are completely normal, you will receive your results only by: [name] MyChart Message (if you have MyChart) OR . A paper copy in the mail If you have any lab test that is abnormal or we need to change your treatment, we will call you to review the results.  Testing/Procedures: 1- Echocardiogram Your physician has requested that you have an echocardiogram. Echocardiography is a painless test that uses sound waves to create images of your heart. It provides your doctor with information about the size and shape of your heart and how well your heart's chambers and valves are working. This procedure takes approximately one hour. There are no restrictions for this procedure.    Follow-Up: At [facility], you and your health needs are our priority.  As part of our continuing mission to provide you with exceptional heart care, we have created designated Provider Care Teams.  These Care Teams include your primary Cardiologist (physician) and Advanced Practice Providers (APPs -  Physician Assistants and Nurse Practitioners) who all work together to provide you with the care you need, when you need it. You will need a follow up appointment in 3 months.  Please call our office 2 months in advance to schedule this appointment.  You may see Dr. Rockey Situ or one of the following Advanced Practice Providers on your designated Care Team:   Murray Hodgkins, NP [name], PA-C . Marrianne Mood, PA-C  Mainstays of therapy for aneurysms include good blood pressure control, healthy lifestyle, and  avoiding fluoroquinolone antibiotic medications (such as those in the "Cipro" class, ending in "floxacin") due to risk of damage to the aorta. This is a finding I would expect to be monitored periodically by their primary cardiologist. Since aneurysms can run in families, you should discuss your diagnosis with first degree relatives as they may need to be screened for this. Regular mild-moderate physical exercise is OK but avoid heavy lifting/weight lifting over 30lbs, chopping wood, shoveling snow or digging heavy earth with a shovel. I typically refer patients to this flyer (https://medicine.[employer].tn.pdf) for more information, keeping in mind of some of this information also pertains to people who have had surgery for this.

## 2018-07-13 ENCOUNTER — Telehealth: Payer: Self-pay

## 2018-07-13 LAB — BASIC METABOLIC PANEL
BUN/Creatinine Ratio: 17 (ref 10–24)
BUN: 24 mg/dL (ref 8–27)
CO2: 20 mmol/L (ref 20–29)
Calcium: 9.5 mg/dL (ref 8.6–10.2)
Chloride: 105 mmol/L (ref 96–106)
Creatinine, Ser: 1.45 mg/dL — ABNORMAL HIGH (ref 0.76–1.27)
GFR calc Af Amer: 52 mL/min/{1.73_m2} — ABNORMAL LOW (ref >59)
GFR calc non Af Amer: 45 mL/min/{1.73_m2} — ABNORMAL LOW (ref >59)
Glucose: 104 mg/dL — ABNORMAL HIGH (ref 65–99)
Potassium: 4.7 mmol/L (ref 3.5–5.2)
Sodium: 139 mmol/L (ref 134–144)

## 2018-07-13 LAB — LIPID PANEL
Chol/HDL Ratio: 2.1 ratio (ref 0.0–5.0)
Cholesterol, Total: 130 mg/dL (ref 100–199)
HDL: 62 mg/dL (ref >39)
LDL Calculated: 55 mg/dL (ref 0–99)
Triglycerides: 65 mg/dL (ref 0–149)
VLDL Cholesterol Cal: 13 mg/dL (ref 5–40)

## 2018-07-13 NOTE — Telephone Encounter (Signed)
-----   Message from Rise Mu, PA-C sent at 07/13/2018  7:45 AM EST ----- LDL well controlled at 55.  Renal function is improved from 1.8 to 1.45 with appears to be close to his baseline.  Potassium is ok at 4.7.  Patient has been managed on Eliquis 2.5 mg twice daily given his age and renal function being greater than 1.5.  However, it appears his renal function typically hovers around 1.4-1.5 making dosing with Eliquis somewhat difficult. His Estimated Creatinine Clearance: 42.6 mL/min (A) (by C-G formula based on SCr of 1.45 mg/dL (H)). Given this, he would qualify for reduced dose Xarelto 15 mg daily which would be more consistent dosing and a once a day medication. Please check with patient and see if he would like to transition from Eliquis to [place].  If so, please send in Xarelto 15 mg once daily and he should start his Xarelto in place of his next Eliquis dose.  Followed by no more Eliquis moving forward.

## 2018-07-13 NOTE — Telephone Encounter (Signed)
Patient returning call to discuss below.  

## 2018-07-13 NOTE — Telephone Encounter (Signed)
Returned call to patient, pt verbalized understanding of POC. He reported that he would like to continue on Eliquis for now so that he can use his remaining medication, then he would be willing to make the med change. He reported that he will call us when he is ready to go forward with Xarelto.   Advised pt to call for any further questions or concerns

## 2018-07-20 ENCOUNTER — Telehealth: Payer: Self-pay | Admitting: Oncology

## 2018-07-20 NOTE — Telephone Encounter (Signed)
New referral received from Dr. Gloriann Loan for prostate cancer. The pt's daughter cld me back to schedule an appt, An appt has been scheduled for the pt to see Dr. Alen Blew on 12/31 at 2pm. Aware to arrive 30 minutes early.

## 2018-07-29 ENCOUNTER — Inpatient Hospital Stay: Payer: Medicare Other | Admitting: Oncology

## 2018-07-29 ENCOUNTER — Telehealth: Payer: Self-pay | Admitting: *Deleted

## 2018-07-29 NOTE — Telephone Encounter (Signed)
Patient's granddaughter calling to cancel new patient appt today with dr Alen Blew @ 2:00 pm. States patient is in a lot of pain and has blood in his urine. He has appt with urologist at 1:30 today. Will notify new patient coordinator to r/s visit.

## 2018-08-04 ENCOUNTER — Other Ambulatory Visit: Payer: Self-pay

## 2018-08-04 ENCOUNTER — Encounter (HOSPITAL_COMMUNITY): Payer: Self-pay

## 2018-08-04 ENCOUNTER — Observation Stay (HOSPITAL_COMMUNITY)
Admission: EM | Admit: 2018-08-04 | Discharge: 2018-08-09 | Disposition: A | Discharge status: Home / Self Care | Payer: Medicare Other | Attending: Internal Medicine | Admitting: Internal Medicine

## 2018-08-04 ENCOUNTER — Telehealth: Payer: Self-pay | Admitting: Oncology

## 2018-08-04 DIAGNOSIS — Z87891 Personal history of nicotine dependence: Secondary | ICD-10-CM | POA: Diagnosis not present

## 2018-08-04 DIAGNOSIS — Z951 Presence of aortocoronary bypass graft: Secondary | ICD-10-CM | POA: Insufficient documentation

## 2018-08-04 DIAGNOSIS — R310 Gross hematuria: Secondary | ICD-10-CM

## 2018-08-04 DIAGNOSIS — Z7901 Long term (current) use of anticoagulants: Secondary | ICD-10-CM | POA: Insufficient documentation

## 2018-08-04 DIAGNOSIS — I4819 Other persistent atrial fibrillation: Secondary | ICD-10-CM | POA: Diagnosis not present

## 2018-08-04 DIAGNOSIS — B965 Pseudomonas (aeruginosa) (mallei) (pseudomallei) as the cause of diseases classified elsewhere: Secondary | ICD-10-CM | POA: Diagnosis present

## 2018-08-04 DIAGNOSIS — M1990 Unspecified osteoarthritis, unspecified site: Secondary | ICD-10-CM | POA: Insufficient documentation

## 2018-08-04 DIAGNOSIS — I255 Ischemic cardiomyopathy: Secondary | ICD-10-CM | POA: Diagnosis not present

## 2018-08-04 DIAGNOSIS — L89156 Pressure-induced deep tissue damage of sacral region: Secondary | ICD-10-CM | POA: Insufficient documentation

## 2018-08-04 DIAGNOSIS — K219 Gastro-esophageal reflux disease without esophagitis: Secondary | ICD-10-CM | POA: Insufficient documentation

## 2018-08-04 DIAGNOSIS — Z923 Personal history of irradiation: Secondary | ICD-10-CM | POA: Insufficient documentation

## 2018-08-04 DIAGNOSIS — Z79899 Other long term (current) drug therapy: Secondary | ICD-10-CM | POA: Insufficient documentation

## 2018-08-04 DIAGNOSIS — Z881 Allergy status to other antibiotic agents status: Secondary | ICD-10-CM | POA: Insufficient documentation

## 2018-08-04 DIAGNOSIS — N209 Urinary calculus, unspecified: Secondary | ICD-10-CM | POA: Diagnosis present

## 2018-08-04 DIAGNOSIS — N3001 Acute cystitis with hematuria: Principal | ICD-10-CM

## 2018-08-04 DIAGNOSIS — Z87442 Personal history of urinary calculi: Secondary | ICD-10-CM | POA: Insufficient documentation

## 2018-08-04 DIAGNOSIS — I129 Hypertensive chronic kidney disease with stage 1 through stage 4 chronic kidney disease, or unspecified chronic kidney disease: Secondary | ICD-10-CM | POA: Diagnosis not present

## 2018-08-04 DIAGNOSIS — R319 Hematuria, unspecified: Secondary | ICD-10-CM | POA: Diagnosis present

## 2018-08-04 DIAGNOSIS — N183 Chronic kidney disease, stage 3 (moderate): Secondary | ICD-10-CM | POA: Diagnosis not present

## 2018-08-04 DIAGNOSIS — Z8546 Personal history of malignant neoplasm of prostate: Secondary | ICD-10-CM | POA: Insufficient documentation

## 2018-08-04 DIAGNOSIS — I340 Nonrheumatic mitral (valve) insufficiency: Secondary | ICD-10-CM | POA: Insufficient documentation

## 2018-08-04 DIAGNOSIS — N390 Urinary tract infection, site not specified: Secondary | ICD-10-CM

## 2018-08-04 DIAGNOSIS — R2681 Unsteadiness on feet: Secondary | ICD-10-CM | POA: Insufficient documentation

## 2018-08-04 DIAGNOSIS — E785 Hyperlipidemia, unspecified: Secondary | ICD-10-CM | POA: Diagnosis not present

## 2018-08-04 DIAGNOSIS — I2510 Atherosclerotic heart disease of native coronary artery without angina pectoris: Secondary | ICD-10-CM | POA: Insufficient documentation

## 2018-08-04 DIAGNOSIS — A498 Other bacterial infections of unspecified site: Secondary | ICD-10-CM | POA: Diagnosis present

## 2018-08-04 DIAGNOSIS — I4811 Longstanding persistent atrial fibrillation: Secondary | ICD-10-CM | POA: Diagnosis present

## 2018-08-04 DIAGNOSIS — L8990 Pressure ulcer of unspecified site, unspecified stage: Secondary | ICD-10-CM

## 2018-08-04 LAB — CBC WITH DIFFERENTIAL/PLATELET
Abs Immature Granulocytes: 0.02 10*3/uL (ref 0.00–0.07)
Basophils Absolute: 0 10*3/uL (ref 0.0–0.1)
Basophils Relative: 0 %
Eosinophils Absolute: 0 10*3/uL (ref 0.0–0.5)
Eosinophils Relative: 1 %
HCT: 38 % — ABNORMAL LOW (ref 39.0–52.0)
Hemoglobin: 12 g/dL — ABNORMAL LOW (ref 13.0–17.0)
Immature Granulocytes: 0 %
Lymphocytes Relative: 9 %
Lymphs Abs: 0.5 10*3/uL — ABNORMAL LOW (ref 0.7–4.0)
MCH: 31.9 pg (ref 26.0–34.0)
MCHC: 31.6 g/dL (ref 30.0–36.0)
MCV: 101.1 fL — ABNORMAL HIGH (ref 80.0–100.0)
Monocytes Absolute: 0.4 10*3/uL (ref 0.1–1.0)
Monocytes Relative: 7 %
Neutro Abs: 5.2 10*3/uL (ref 1.7–7.7)
Neutrophils Relative %: 83 %
Platelets: 161 10*3/uL (ref 150–400)
RBC: 3.76 MIL/uL — ABNORMAL LOW (ref 4.22–5.81)
RDW: 14.4 % (ref 11.5–15.5)
WBC: 6.2 10*3/uL (ref 4.0–10.5)
nRBC: 0 % (ref 0.0–0.2)

## 2018-08-04 LAB — URINALYSIS, MICROSCOPIC (REFLEX): RBC / HPF: >50 RBC/hpf (ref 0–5)

## 2018-08-04 LAB — URINALYSIS, ROUTINE W REFLEX MICROSCOPIC
Glucose, UA: 100 mg/dL — AB
Ketones, ur: 15 mg/dL — AB
Nitrite: POSITIVE — AB
Protein, ur: >300 mg/dL — AB
Specific Gravity, Urine: 1.015 (ref 1.005–1.030)
pH: 6.5 (ref 5.0–8.0)

## 2018-08-04 LAB — BASIC METABOLIC PANEL
Anion gap: 8 (ref 5–15)
BUN: 17 mg/dL (ref 8–23)
CO2: 23 mmol/L (ref 22–32)
Calcium: 8.9 mg/dL (ref 8.9–10.3)
Chloride: 108 mmol/L (ref 98–111)
Creatinine, Ser: 1.3 mg/dL — ABNORMAL HIGH (ref 0.61–1.24)
GFR calc Af Amer: 60 mL/min — ABNORMAL LOW (ref >60)
GFR calc non Af Amer: 52 mL/min — ABNORMAL LOW (ref >60)
Glucose, Bld: 111 mg/dL — ABNORMAL HIGH (ref 70–99)
Potassium: 4.1 mmol/L (ref 3.5–5.1)
Sodium: 139 mmol/L (ref 135–145)

## 2018-08-04 MED ORDER — MORPHINE SULFATE (PF) 2 MG/ML IV SOLN
2.0000 mg | Freq: Once | INTRAVENOUS | Status: AC
  Administered 2018-08-04: 2 mg via INTRAVENOUS
  Filled 2018-08-04: qty 1

## 2018-08-04 MED ORDER — MORPHINE SULFATE (PF) 2 MG/ML IV SOLN
2.0000 mg | Freq: Once | INTRAVENOUS | Status: AC
  Administered 2018-08-05: 2 mg via INTRAVENOUS
  Filled 2018-08-04: qty 1

## 2018-08-04 NOTE — ED Notes (Addendum)
Bladder scan showed 49mL post void

## 2018-08-04 NOTE — ED Notes (Signed)
Bed: WA21 Expected date:  Expected time:  Means of arrival:  Comments: 83 yr old hematuria

## 2018-08-04 NOTE — ED Triage Notes (Signed)
Pt BIB [facility] with c/o increased pain and hematuria. Hx of prostate Ca. Bleeding is abnormal for this pt. Pain is 6/10 in lower abdomen radiating into back

## 2018-08-04 NOTE — Telephone Encounter (Signed)
Cld the pt's daughter [name] to reschedule Mr. Sanz's appt w/Dr. Alen Blew. She stated that she will cb to schedule.

## 2018-08-04 NOTE — ED Provider Notes (Signed)
[facility] DEPT Provider Note   CSN: [id number] Arrival date & time: 08/04/18  1936     History   Chief Complaint Chief Complaint  Patient presents with  . Hematuria    HPI Carl [name] is a 83 y.o. male with past medical history of CAD, AAA hypertension, prostate cancer, A. fib anticoagulated with Eliquis, who presents today for evaluation of hematuria.  He reports that he has been having hematuria and has seen urology for this.  [facility].  He reports that he is having increased pain beyond what he usually does.  His pain is 6 out of 10, and his lower abdomen radiating into his back.  He reports that he is recently been taking antibiotics for urinary tract infection, is unsure which antibiotics.  HPI  Past Medical History:  Diagnosis Date  . AKI (acute kidney injury) ([place]) 01/01/2018  . Anemia   . Arthritis    arms  . Bladder calculus   . Chronic anxiety   . CKD (chronic kidney disease), stage III ([place])   . Community acquired pneumonia of right lower lobe of lung ([place]) 09/15/2017  . Coronary artery disease CARDIOLOGIST-  DR Loralie Champagne   a. CABG 2006 with  LIMA-LAD, SVG-Ramus, SVG-OM, seq SVG-PLV-PDA.  [name] GERD (gastroesophageal reflux disease)   . History of prostate cancer urologist-  dr Risa Grill   hx radioactive prostate seed implants 1997--  currently on Lupron injection  . Hyperlipidemia   . Hyperplasia of prostate without lower urinary tract symptoms (LUTS)   . Hypertension   . Ischemic cardiomyopathy    echo (01/13) 45-50% septal akinesis, mild MR/  echo  (01/16) ef 60-65%, mid-apical anteroseptal hypokinesis  . Lung nodule   . Moderate mitral regurgitation   . Orthostatic hypotension   . PAC (premature atrial contraction)   . PAF (paroxysmal atrial fibrillation) ([place])    a. prior DCCV 10/2016.  . Paroxysmal atrial flutter ([place])    a. dx 02/2017, started on amiodarone.  . S/P CABG x 5 04/04/2005   LIMA to LAD,   SVG to Ramus,  SVG to OM,  seqSVG to PLV and PDA  . Wears glasses     Patient Active Problem List   Diagnosis Date Noted  . Kidney stones 06/30/2018  . Allergic rhinitis 04/26/2018  . Skin tear of elbow without complication 23/30/0762  . Complicated UTI (urinary tract infection) 01/05/2018  . Urolithiasis 01/05/2018  . Frequent urination 01/01/2018  . Colon polyps 09/15/2017  . GERD (gastroesophageal reflux disease) 09/15/2017  . Wrist pain, acute, right 05/25/2017  . Low back pain 03/24/2017  . Lung nodule seen on imaging study: 7 mm nodule left midlung 03/13/2017  . Physical deconditioning 03/13/2017  . Normocytic anemia 03/13/2017  . Fall 03/12/2017  . Abdominal soreness 03/12/2017  . Generalized weakness 03/12/2017  . Atrial fibrillation with RVR ([place]) 03/12/2017  . Leukocytosis 03/12/2017  . Hyponatremia 03/12/2017  . Orthostatic hypotension 02/16/2017  . Blood in stool 09/19/2016  . Anticoagulated 09/19/2016  . History of prostate cancer 09/04/2016  . Persistent atrial fibrillation 09/04/2016  . Acute congestive heart failure ([place]) 09/04/2016  . Cardiomyopathy, ischemic 07/16/2012  . Chronic kidney disease (CKD), stage III (moderate) ([place]) 07/16/2012  . Essential hypertension 08/05/2011  . Murmur 08/05/2011  . 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 Dyslipidemia 02/04/2011    Past Surgical History:  Procedure Laterality Date  . CARDIAC CATHETERIZATION  04/02/2005   dr Doreatha Lew  Critical three-vessel coronary disease --  Essentially normal left ventricular function , ef 55%  . CARDIOVASCULAR STRESS TEST  05-01-2008  dr Doreatha Lew /  dr harding   Low risk nuclear study w/ no ischemia or scar but flattened septal motion/  ef 64%  . CARDIOVERSION N/A 11/19/2016   Procedure: CARDIOVERSION;  Surgeon: Jerline Pain, MD;  Location: [facility];  Service: Cardiovascular;  Laterality: N/A;  . COLONOSCOPY N/A 10/23/2016   Procedure: COLONOSCOPY;  Surgeon: Milus Banister, MD;   Location: [facility];  Service: Endoscopy;  Laterality: N/A;  . CORONARY ARTERY BYPASS GRAFT  04/04/2005   dr  Ricard Dillon   LIMA - LAD,  SVG - Ramus, SVG - OM,  seqSVG - PLV and PDA  . CYSTOSCOPY WITH LITHOLAPAXY N/A 05/07/2016   Procedure: CYSTOSCOPY WITH LITHOLAPAXY;  Surgeon: Rana Snare, MD;  Location: [facility];  Service: Urology;  Laterality: N/A;  . HOLMIUM LASER APPLICATION N/A 61/95/0932   Procedure: HOLMIUM LASER APPLICATION;  Surgeon: Rana Snare, MD;  Location: [facility];  Service: Urology;  Laterality: N/A;  . INGUINAL HERNIA REPAIR Left 03-30-2005  dr Excell Seltzer   incarcerated   . RADIOACTIVE PROSTATE SEED IMPLANTS  1997  . TRANSTHORACIC ECHOCARDIOGRAM  08/24/2014   mid anteroseptal and distal septak hypokinetic,  mild focal basal LVH, ef 60-65%,  grade 1 diastolic dysfunction/  mild MR/  mild LAE/  mild dilated RV with normal RVSP/  trivial TR        Home Medications    Prior to Admission medications   Medication Sig Start Date End Date Taking? Authorizing Provider  acetaminophen (TYLENOL) 500 MG tablet Take 1,000 mg by mouth every 6 (six) hours as needed for mild pain (for pain).    Yes [provider]  amiodarone (PACERONE) 200 MG tablet Take 1 tablet (200 mg total) by mouth daily. 07/12/18  Yes Dunn, Areta Haber, PA-C  apixaban (ELIQUIS) 2.5 MG TABS tablet Take 1 tablet (2.5 mg total) by mouth 2 (two) times daily. 07/12/18  Yes Dunn, Areta Haber, PA-C  cefUROXime (CEFTIN) 500 MG tablet Take 500 mg by mouth 2 (two) times daily with a meal.   Yes [provider]  omeprazole (PRILOSEC) 40 MG capsule Take 1 capsule (40 mg total) by mouth daily. 06/18/18  Yes [name], [name], MD  rosuvastatin (CRESTOR) 40 MG tablet Take 1 tablet (40 mg total) by mouth daily. 06/18/18  Yes [name], [name], MD  tamsulosin (FLOMAX) 0.4 MG CAPS capsule Take 1 capsule (0.4 mg total) by mouth daily after supper. 01/06/18  Yes [name], MD     Family History Family History  Problem Relation Age of Onset  . Heart disease Mother   . Cancer Mother   . Cancer Father   . Cancer Brother     Social History Social History   Tobacco Use  . Smoking status: Former Smoker    Years: 2.00    Types: Cigarettes    Last attempt to quit: 07/28/1960    Years since quitting: 58.0  . Smokeless tobacco: Never Used  Substance Use Topics  . Alcohol use: No  . Drug use: No     Allergies   Ciprofloxacin   Review of Systems Review of Systems  Constitutional: Negative for chills and fever.  HENT: Negative for congestion.   Respiratory: Negative for chest tightness and shortness of breath.   Cardiovascular: Negative for chest pain.  Gastrointestinal: Positive for abdominal pain. Negative for diarrhea,  nausea and vomiting.  Genitourinary: Positive for dysuria and hematuria. Negative for flank pain, frequency and testicular pain.  All other systems reviewed and are negative.    Physical Exam Updated Vital Signs BP (!) 164/84   Pulse 72   Temp 98.5 F (36.9 C) (Rectal)   Resp 17   Ht 5\' 11"  (1.803 m)   Wt 74.4 kg   SpO2 99%   BMI 22.87 kg/m   Physical Exam Vitals signs and nursing note reviewed.  Constitutional:      Appearance: He is well-developed.     Comments: Chronically ill appearing.   HENT:     Head: Normocephalic and atraumatic.     Mouth/Throat:     Mouth: Mucous membranes are moist.  Eyes:     General: No scleral icterus.    Conjunctiva/sclera: Conjunctivae normal.  Neck:     Musculoskeletal: Normal range of motion and neck supple.  Cardiovascular:     Rate and Rhythm: Normal rate and regular rhythm.     Heart sounds: Normal heart sounds. No murmur.  Pulmonary:     Effort: Pulmonary effort is normal. No respiratory distress.     Breath sounds: Normal breath sounds.  Abdominal:     Palpations: Abdomen is soft. There is no mass.     Tenderness: There is no abdominal tenderness. There is no right CVA  tenderness or left CVA tenderness.  Musculoskeletal:     Comments: Back is generally TTP.   Skin:    General: Skin is warm and dry.     Coloration: Skin is pale.  Neurological:     General: No focal deficit present.     Mental Status: He is alert.      ED Treatments / Results  Labs (all labs ordered are listed, but only abnormal results are displayed) Labs Reviewed  URINALYSIS, ROUTINE W REFLEX MICROSCOPIC - Abnormal; Notable for the following components:      Result Value   Color, Urine RED (*)    APPearance TURBID (*)    Glucose, UA 100 (*)    Hgb urine dipstick LARGE (*)    Bilirubin Urine LARGE (*)    Ketones, ur 15 (*)    Protein, ur >300 (*)    Nitrite POSITIVE (*)    Leukocytes, UA LARGE (*)    All other components within normal limits  BASIC METABOLIC PANEL - Abnormal; Notable for the following components:   Glucose, Bld 111 (*)    Creatinine, Ser 1.30 (*)    GFR calc non Af Amer 52 (*)    GFR calc Af Amer 60 (*)    All other components within normal limits  CBC WITH DIFFERENTIAL/PLATELET - Abnormal; Notable for the following components:   RBC 3.76 (*)    Hemoglobin 12.0 (*)    HCT 38.0 (*)    MCV 101.1 (*)    Lymphs Abs 0.5 (*)    All other components within normal limits  URINALYSIS, MICROSCOPIC (REFLEX) - Abnormal; Notable for the following components:   Bacteria, UA MANY (*)    All other components within normal limits  URINE CULTURE    EKG None  Radiology No results found.  Procedures Procedures (including critical care time)  Medications Ordered in ED Medications  morphine 2 MG/ML injection 2 mg (has no administration in time range)  morphine 2 MG/ML injection 2 mg (2 mg Intravenous Given 08/04/18 2259)     Initial Impression / Assessment and Plan / ED Course  I have reviewed the triage vital signs and the nursing notes.  Pertinent labs & imaging results that were available during my care of the patient were reviewed by me and considered  in my medical decision making (see chart for details).  Clinical Course as of Aug 04 2352  Wed Aug 04, 2018  2156 Spoke with Dr. Case woods from [facility] urology.      [EH]  2159 He has pseudomonas UTI on jan 2nd, was given rx for 7 days of cefuroxine,    [EH]  2203 Prior culture showed resistance to FQ    [EH]  2204 Resistant to cipro, intermediate to levaquinSensitive to amacasin, azotranam, ceftazadine. Cefepime, gent, mero, zosyn and tobra.    [EH]  2351 Spoke with Dr. Alcario Drought who will admit patient.    [EH]    Clinical Course User Index [EH] Lorin Glass, PA-C   [name] presents today for evaluation of worsening hematuria.  He is a known history of kidney stones and follows with [facility].  He is currently being treated for a Pseudomonas infection with cefuroxime, however reports that he is not getting any better and his hematuria has worsened.  His urine was turbid and grossly bloody with nitrite positive, large leukocytes over 300 protein large bilirubin and large blood.  His hemoglobin is 12.0.  He does not have a leukocytosis.  He is afebrile not tachycardic or tachypneic, does not meet Sirs or sepsis criteria.  He is chronic back pain which he says may be a little bit worse than usual.  His pain was treated in the emergency room with morphine.  Spoke with on call Dr. Sherral Hammers from [facility] urology regarding need for scan, and antibiotics.   Given that patient's urine still appears very infected and he has had multiple days of antibiotics I feel like this represents a failure of outpatient antibiotics.  Remainder of choices are IV.  Urine culture is sent.  His creatinine is 1.30 which is slightly improved from previous.  Renal ultrasound was ordered to evaluate for presence of hydronephrosis in the setting of infection with known history of stones.  This patient was seen as a shared visit with Dr. Alvino Chapel.    I spoke with Dr. Alcario Drought who agreed to admit the  patient.   Final Clinical Impressions(s) / ED Diagnoses   Final diagnoses:  Acute cystitis with hematuria  Anticoagulated  Gross hematuria    ED Discharge Orders    None       Ollen Gross 08/05/18 0011    Davonna Belling, MD 08/06/18 [phone]

## 2018-08-04 NOTE — ED Notes (Signed)
Temperature is 98.5 rectally

## 2018-08-05 ENCOUNTER — Ambulatory Visit: Payer: Medicare Other | Admitting: Gastroenterology

## 2018-08-05 ENCOUNTER — Inpatient Hospital Stay (HOSPITAL_COMMUNITY): Payer: Medicare Other

## 2018-08-05 DIAGNOSIS — Z7901 Long term (current) use of anticoagulants: Secondary | ICD-10-CM

## 2018-08-05 DIAGNOSIS — R310 Gross hematuria: Secondary | ICD-10-CM | POA: Diagnosis not present

## 2018-08-05 DIAGNOSIS — L8990 Pressure ulcer of unspecified site, unspecified stage: Secondary | ICD-10-CM

## 2018-08-05 DIAGNOSIS — I4819 Other persistent atrial fibrillation: Secondary | ICD-10-CM

## 2018-08-05 DIAGNOSIS — R319 Hematuria, unspecified: Secondary | ICD-10-CM | POA: Diagnosis present

## 2018-08-05 DIAGNOSIS — N3001 Acute cystitis with hematuria: Secondary | ICD-10-CM

## 2018-08-05 DIAGNOSIS — B965 Pseudomonas (aeruginosa) (mallei) (pseudomallei) as the cause of diseases classified elsewhere: Secondary | ICD-10-CM | POA: Diagnosis present

## 2018-08-05 DIAGNOSIS — N390 Urinary tract infection, site not specified: Secondary | ICD-10-CM

## 2018-08-05 DIAGNOSIS — N201 Calculus of ureter: Secondary | ICD-10-CM

## 2018-08-05 LAB — BASIC METABOLIC PANEL
Anion gap: 10 (ref 5–15)
BUN: 15 mg/dL (ref 8–23)
CO2: 20 mmol/L — ABNORMAL LOW (ref 22–32)
Calcium: 8.5 mg/dL — ABNORMAL LOW (ref 8.9–10.3)
Chloride: 109 mmol/L (ref 98–111)
Creatinine, Ser: 1.08 mg/dL (ref 0.61–1.24)
GFR calc Af Amer: >60 mL/min (ref >60)
GFR calc non Af Amer: >60 mL/min (ref >60)
Glucose, Bld: 89 mg/dL (ref 70–99)
Potassium: 3.8 mmol/L (ref 3.5–5.1)
Sodium: 139 mmol/L (ref 135–145)

## 2018-08-05 LAB — CBC
HCT: 35.5 % — ABNORMAL LOW (ref 39.0–52.0)
Hemoglobin: 11 g/dL — ABNORMAL LOW (ref 13.0–17.0)
MCH: 31.8 pg (ref 26.0–34.0)
MCHC: 31 g/dL (ref 30.0–36.0)
MCV: 102.6 fL — ABNORMAL HIGH (ref 80.0–100.0)
Platelets: 140 10*3/uL — ABNORMAL LOW (ref 150–400)
RBC: 3.46 MIL/uL — ABNORMAL LOW (ref 4.22–5.81)
RDW: 14.6 % (ref 11.5–15.5)
WBC: 6.1 10*3/uL (ref 4.0–10.5)
nRBC: 0 % (ref 0.0–0.2)

## 2018-08-05 MED ORDER — SODIUM CHLORIDE 0.9 % IV SOLN
1.0000 g | Freq: Two times a day (BID) | INTRAVENOUS | Status: DC
  Administered 2018-08-05: 1 g via INTRAVENOUS
  Filled 2018-08-05 (×2): qty 1

## 2018-08-05 MED ORDER — PANTOPRAZOLE SODIUM 40 MG PO TBEC
80.0000 mg | DELAYED_RELEASE_TABLET | Freq: Every day | ORAL | Status: DC
  Administered 2018-08-05 – 2018-08-09 (×5): 80 mg via ORAL
  Filled 2018-08-05 (×5): qty 2

## 2018-08-05 MED ORDER — ROSUVASTATIN CALCIUM 10 MG PO TABS
40.0000 mg | ORAL_TABLET | Freq: Every day | ORAL | Status: DC
  Administered 2018-08-05 – 2018-08-09 (×5): 40 mg via ORAL
  Filled 2018-08-05 (×5): qty 4

## 2018-08-05 MED ORDER — ACETAMINOPHEN 650 MG RE SUPP: 650.0000 mg | Freq: Four times a day (QID) | RECTAL | Status: DC | PRN

## 2018-08-05 MED ORDER — SODIUM CHLORIDE 0.9 % IV SOLN
2.0000 g | Freq: Three times a day (TID) | INTRAVENOUS | Status: DC
  Administered 2018-08-05 – 2018-08-09 (×13): 2 g via INTRAVENOUS
  Filled 2018-08-05 (×14): qty 2

## 2018-08-05 MED ORDER — TAMSULOSIN HCL 0.4 MG PO CAPS
0.4000 mg | ORAL_CAPSULE | Freq: Every day | ORAL | Status: DC
  Administered 2018-08-05 – 2018-08-08 (×4): 0.4 mg via ORAL
  Filled 2018-08-05 (×4): qty 1

## 2018-08-05 MED ORDER — ONDANSETRON HCL 4 MG/2ML IJ SOLN: 4.0000 mg | Freq: Four times a day (QID) | INTRAMUSCULAR | Status: DC | PRN

## 2018-08-05 MED ORDER — ACETAMINOPHEN-CODEINE #3 300-30 MG PO TABS
1.0000 | ORAL_TABLET | ORAL | Status: DC | PRN
  Administered 2018-08-05: 1 via ORAL
  Administered 2018-08-05: 2 via ORAL
  Administered 2018-08-05: 1 via ORAL
  Filled 2018-08-05 (×2): qty 1
  Filled 2018-08-05: qty 2

## 2018-08-05 MED ORDER — AMIODARONE HCL 200 MG PO TABS
200.0000 mg | ORAL_TABLET | Freq: Every day | ORAL | Status: DC
  Administered 2018-08-05 – 2018-08-09 (×5): 200 mg via ORAL
  Filled 2018-08-05 (×5): qty 1

## 2018-08-05 MED ORDER — ACETAMINOPHEN 325 MG PO TABS: 650.0000 mg | ORAL_TABLET | Freq: Four times a day (QID) | ORAL | Status: DC | PRN

## 2018-08-05 MED ORDER — ONDANSETRON HCL 4 MG PO TABS: 4.0000 mg | ORAL_TABLET | Freq: Four times a day (QID) | ORAL | Status: DC | PRN

## 2018-08-05 MED ORDER — ACETAMINOPHEN 500 MG PO TABS
1000.0000 mg | ORAL_TABLET | Freq: Four times a day (QID) | ORAL | Status: DC | PRN
  Administered 2018-08-06 – 2018-08-08 (×5): 1000 mg via ORAL
  Filled 2018-08-05 (×4): qty 2

## 2018-08-05 MED ORDER — MORPHINE SULFATE (PF) 2 MG/ML IV SOLN
2.0000 mg | INTRAVENOUS | Status: DC | PRN
  Administered 2018-08-05: 2 mg via INTRAVENOUS
  Administered 2018-08-05 – 2018-08-06 (×2): 4 mg via INTRAVENOUS
  Administered 2018-08-06 – 2018-08-08 (×3): 2 mg via INTRAVENOUS
  Filled 2018-08-05 (×4): qty 1
  Filled 2018-08-05 (×2): qty 2

## 2018-08-05 NOTE — Progress Notes (Signed)
Pharmacy Antibiotic Note  Carl [name] is a 83 y.o. male with hx PsA UTI, presented to the ED on 08/04/2018 with c/o hematuria.  Per Dr. Alcario Drought, UCx on 07/29/18 from urology office has PsA.  To start ceftazidime for infection.  - 07/29/18 (per Dr. Alcario Drought, from urologist office): PsA (S= fortaz, cefepime, zpsyn, gent, merrem, tobra; R= cipro; I= LVQ)  - scr 1.30 (crcl~47)  Plan: - ceftazidime 1 gm IV q12h (dose adjusted for crcl 31-50) - watch renal function - f/u cx  _________________________________________  Height: 5\' 11"  (180.3 cm) Weight: 164 lb (74.4 kg) IBW/kg (Calculated) : 75.3  Temp (24hrs), Avg:98.5 F (36.9 C), Min:98.5 F (36.9 C), Max:98.5 F (36.9 C)  Recent Labs  Lab 08/04/18 2002  WBC 6.2  CREATININE 1.30*    Estimated Creatinine Clearance: 47.7 mL/min (A) (by C-G formula based on SCr of 1.3 mg/dL (H)).    Allergies  Allergen Reactions  . Ciprofloxacin      Thank you for allowing pharmacy to be a part of this patient's care.  Lynelle Doctor 08/05/2018 12:02 AM

## 2018-08-05 NOTE — H&P (Signed)
History and Physical    Carl [name] CBS:[id number] DOB: 05-19-1936 DOA: 08/04/2018  PCP: Carl Haven, MD  Patient coming from: Home  I have personally briefly reviewed patient's old medical records in [place]  Chief Complaint: Hematuria  HPI: Carl [name] is a 83 y.o. male with medical history significant of CAD, HTN, prostate Ca, rad seed implant, A.Fib on eliquis.  Patient presents to the ED for evaluation of hematuria.  Has been having hematuria, saw [facility] for [facility].  Started on empiric cefuroxime on 07/29/18 and UCx obtained at that time.  Since then symptoms have persisted including dysuria, 6/10 pelvic and back pain, gross hematuria.  Presents to ED for ongoing symtpoms.   ED Course: On discussion with urologist, turns out the culture grew out Pseudomonas (which explains why cefuroxime didn't work).  P. Aureg was resistant to Cipro, intermediate to Levoquin, sensitive to: amacasin, azotranam, ceftazadine. Cefepime, gent, mero, zosyn and tobra.  Patient allergic to cipro anyhow.   Review of Systems: As per HPI otherwise 10 point review of systems negative.   Past Medical History:  Diagnosis Date  . AKI (acute kidney injury) ([place]) 01/01/2018  . Anemia   . Arthritis    arms  . Bladder calculus   . Chronic anxiety   . CKD (chronic kidney disease), stage III ([place])   . Community acquired pneumonia of right lower lobe of lung ([place]) 09/15/2017  . Coronary artery disease CARDIOLOGIST-  DR Loralie Champagne   a. CABG 2006 with  LIMA-LAD, SVG-Ramus, SVG-OM, seq SVG-PLV-PDA.  [name] GERD (gastroesophageal reflux disease)   . History of prostate cancer urologist-  dr Carl [name]   hx radioactive prostate seed implants 1997--  currently on Lupron injection  . Hyperlipidemia   . Hyperplasia of prostate without lower urinary tract symptoms (LUTS)   . Hypertension   . Ischemic cardiomyopathy    echo (01/13) 45-50% septal akinesis, mild MR/  echo  (01/16) ef 60-65%,  mid-apical anteroseptal hypokinesis  . Lung nodule   . Moderate mitral regurgitation   . Orthostatic hypotension   . PAC (premature atrial contraction)   . PAF (paroxysmal atrial fibrillation) ([place])    a. prior DCCV 10/2016.  . Paroxysmal atrial flutter ([place])    a. dx 02/2017, started on amiodarone.  . S/P CABG x 5 04/04/2005   LIMA to LAD,  SVG to Ramus,  SVG to OM,  seqSVG to PLV and PDA  . Wears glasses     Past Surgical History:  Procedure Laterality Date  . CARDIAC CATHETERIZATION  04/02/2005   dr Doreatha Lew    Critical three-vessel coronary disease --  Essentially normal left ventricular function , ef 55%  . CARDIOVASCULAR STRESS TEST  05-01-2008  dr Doreatha Lew /  dr harding   Low risk nuclear study w/ no ischemia or scar but flattened septal motion/  ef 64%  . CARDIOVERSION N/A 11/19/2016   Procedure: CARDIOVERSION;  Surgeon: Jerline Pain, MD;  Location: [facility];  Service: Cardiovascular;  Laterality: N/A;  . COLONOSCOPY N/A 10/23/2016   Procedure: COLONOSCOPY;  Surgeon: Milus Banister, MD;  Location: [facility];  Service: Endoscopy;  Laterality: N/A;  . CORONARY ARTERY BYPASS GRAFT  04/04/2005   dr  Ricard Dillon   LIMA - LAD,  SVG - Ramus, SVG - OM,  seqSVG - PLV and PDA  . CYSTOSCOPY WITH LITHOLAPAXY N/A 05/07/2016   Procedure: CYSTOSCOPY WITH LITHOLAPAXY;  Surgeon: Rana Snare, MD;  Location: [facility];  Service: Urology;  Laterality: N/A;  . HOLMIUM LASER APPLICATION N/A 45/80/9983   Procedure: HOLMIUM LASER APPLICATION;  Surgeon: Rana Snare, MD;  Location: [facility];  Service: Urology;  Laterality: N/A;  . INGUINAL HERNIA REPAIR Left 03-30-2005  dr Excell Seltzer   incarcerated   . RADIOACTIVE PROSTATE SEED IMPLANTS  1997  . TRANSTHORACIC ECHOCARDIOGRAM  08/24/2014   mid anteroseptal and distal septak hypokinetic,  mild focal basal LVH, ef 60-65%,  grade 1 diastolic dysfunction/  mild MR/  mild LAE/  mild dilated RV with normal RVSP/  trivial TR      reports that he quit smoking about 58 years ago. His smoking use included cigarettes. He quit after 2.00 years of use. He has never used smokeless tobacco. He reports that he does not drink alcohol or use drugs.  Allergies  Allergen Reactions  . Ciprofloxacin     Family History  Problem Relation Age of Onset  . Heart disease Mother   . Cancer Mother   . Cancer Father   . Cancer Brother      Prior to Admission medications   Medication Sig Start Date End Date Taking? Authorizing Provider  acetaminophen (TYLENOL) 500 MG tablet Take 1,000 mg by mouth every 6 (six) hours as needed for mild pain (for pain).    Yes [provider]  amiodarone (PACERONE) 200 MG tablet Take 1 tablet (200 mg total) by mouth daily. 07/12/18  Yes Dunn, Areta Haber, PA-C  apixaban (ELIQUIS) 2.5 MG TABS tablet Take 1 tablet (2.5 mg total) by mouth 2 (two) times daily. 07/12/18  Yes Dunn, Areta Haber, PA-C  omeprazole (PRILOSEC) 40 MG capsule Take 1 capsule (40 mg total) by mouth daily. 06/18/18  Yes [name], [name], MD  rosuvastatin (CRESTOR) 40 MG tablet Take 1 tablet (40 mg total) by mouth daily. 06/18/18  Yes [name], [name], MD  tamsulosin (FLOMAX) 0.4 MG CAPS capsule Take 1 capsule (0.4 mg total) by mouth daily after supper. 01/06/18  Yes [name], MD    Physical Exam: Vitals:   08/04/18 2300 08/04/18 2330 08/05/18 0000 08/05/18 0030  BP: (!) 150/77 130/74 116/75 (!) 161/79  Pulse: 69   65  Resp:    17  Temp:      TempSrc:      SpO2: 99%   96%  Weight:      Height:        Constitutional: NAD, calm, comfortable Eyes: PERRL, lids and conjunctivae normal ENMT: Mucous membranes are moist. Posterior pharynx clear of any exudate or lesions.Normal dentition.  Neck: normal, supple, no masses, no thyromegaly Respiratory: clear to auscultation bilaterally, no wheezing, no crackles. Normal respiratory effort. No accessory muscle use.  Cardiovascular: Regular rate and rhythm, no murmurs  / rubs / gallops. No extremity edema. 2+ pedal pulses. No carotid bruits.  Abdomen: no tenderness, no masses palpated. No hepatosplenomegaly. Bowel sounds positive.  Musculoskeletal: no clubbing / cyanosis. No joint deformity upper and lower extremities. Good ROM, no contractures. Normal muscle tone.  Skin: no rashes, lesions, ulcers. No induration Neurologic: CN 2-12 grossly intact. Sensation intact, DTR normal. Strength 5/5 in all 4.  Psychiatric: Normal judgment and insight. Alert and oriented x 3. Normal mood.    Labs on Admission: I have personally reviewed following labs and imaging studies  CBC: Recent Labs  Lab 08/04/18 2002  WBC 6.2  NEUTROABS 5.2  HGB 12.0*  HCT 38.0*  MCV 101.1*  PLT 382   Basic Metabolic Panel: Recent  Labs  Lab 08/04/18 2002  NA 139  K 4.1  CL 108  CO2 23  GLUCOSE 111*  BUN 17  CREATININE 1.30*  CALCIUM 8.9   GFR: Estimated Creatinine Clearance: 47.7 mL/min (A) (by C-G formula based on SCr of 1.3 mg/dL (H)). Liver Function Tests: No results for input(s): AST, ALT, ALKPHOS, BILITOT, PROT, ALBUMIN in the last 168 hours. No results for input(s): LIPASE, AMYLASE in the last 168 hours. No results for input(s): AMMONIA in the last 168 hours. Coagulation Profile: No results for input(s): INR, PROTIME in the last 168 hours. Cardiac Enzymes: No results for input(s): CKTOTAL, CKMB, CKMBINDEX, TROPONINI in the last 168 hours. BNP (last 3 results) No results for input(s): PROBNP in the last 8760 hours. HbA1C: No results for input(s): HGBA1C in the last 72 hours. CBG: No results for input(s): GLUCAP in the last 168 hours. Lipid Profile: No results for input(s): CHOL, HDL, LDLCALC, TRIG, CHOLHDL, LDLDIRECT in the last 72 hours. Thyroid Function Tests: No results for input(s): TSH, T4TOTAL, FREET4, T3FREE, THYROIDAB in the last 72 hours. Anemia Panel: No results for input(s): VITAMINB12, FOLATE, FERRITIN, TIBC, IRON, RETICCTPCT in the last 72  hours. Urine analysis:    Component Value Date/Time   COLORURINE RED (A) 08/04/2018 2002   APPEARANCEUR TURBID (A) 08/04/2018 2002   LABSPEC 1.015 08/04/2018 2002   PHURINE 6.5 08/04/2018 2002   GLUCOSEU 100 (A) 08/04/2018 2002   HGBUR LARGE (A) 08/04/2018 2002   BILIRUBINUR LARGE (A) 08/04/2018 2002   BILIRUBINUR small 01/11/2018 1225   KETONESUR 15 (A) 08/04/2018 2002   PROTEINUR >300 (A) 08/04/2018 2002   UROBILINOGEN 0.2 01/11/2018 1225   NITRITE POSITIVE (A) 08/04/2018 2002   LEUKOCYTESUR LARGE (A) 08/04/2018 2002    Radiological Exams on Admission: No results found.  EKG: Independently reviewed.  Assessment/Plan Principal Problem:   Pseudomonas urinary tract infection Active Problems:   Persistent atrial fibrillation   Urolithiasis   Hematuria    1. Pseudomonal UTI with hematuria - 1. Start fortaz 2. US renal bilateral to r/o hydronephrosis 1. Has h/o urolithiasis including ureter stones in Nov, didn't have hydro at the time though 2. If hydro present will need to call urology back. 3. Though seems less likely, as Dr. Sherral Hammers pointed out to EDP, patient doesn't have WBC, fever, nor SIRS 2. A.Fib - 1. Continue amiodarone 2. Holding eliquis due to gross hematuria  DVT prophylaxis: SCDs Code Status: Full Family Communication: No family in room Disposition Plan: Home after admit Consults called: EDP spoke with Dr. Sherral Hammers who was on call for urology Admission status: Admit to inpatient - no PO ABx options available for patients pseudomonal UTI, only IV  Severity of Illness: The appropriate patient status for this patient is INPATIENT. Inpatient status is judged to be reasonable and necessary in order to provide the required intensity of service to ensure the patient's safety. The patient's presenting symptoms, physical exam findings, and initial radiographic and laboratory data in the context of their chronic comorbidities is felt to place them at high risk for  further clinical deterioration. Furthermore, it is not anticipated that the patient will be medically stable for discharge from the hospital within 2 midnights of admission. The following factors support the patient status of inpatient.   " The patient's presenting symptoms include Hematuria, dysuria. " The worrisome physical exam findings include gross hematuria. " The initial radiographic and laboratory data are worrisome because of UA confirming UTI, culture 07/29/18 growing pseudomonas, only ABx options  are IV. " The chronic co-morbidities include A.Fib, prior urolithiasis, prostate Ca s/p radiation seed in 1997.   * I certify that at the point of admission it is my clinical judgment that the patient will require inpatient hospital care spanning beyond 2 midnights from the point of admission due to high intensity of service, high risk for further deterioration and high frequency of surveillance required.Etta Quill DO [facility] Pager [phone] Only works nights!  If 7AM-7PM, please contact the primary day team physician taking care of patient  [email or web address] Password [facility]  08/05/2018, 12:36 AM

## 2018-08-05 NOTE — Progress Notes (Signed)
Pharmacy Antibiotic Note  Carl [name] is a 83 y.o. male with pseudomonas UTI, presented to the ED on 08/04/2018 with c/o hematuria.    Per Dr. Alcario Drought, UCx on 07/29/18 from urology office has PsA.[name] consulted to dose ceftazidime for Pseudomonas UTI.  - 07/29/18 Urine culture from urology office: Pseudomonas aeruginosa (S= amikacin, aztreonam, ceftazidime, cefepime, gentamicin, meropenem, pip/tazo, tobra. I = levofloxacin. R = cipro  Today, 08/05/18  WBC WNL  Afebrile  SCr 1.08, CrCl ~ 57 mL/min  Plan: - Increase ceftazidime to 2 gm IV q8h (CrCl > 50 mL/min) - Monitor renal function  _________________________________________  Height: 5\' 11"  (180.3 cm) Weight: 164 lb (74.4 kg) IBW/kg (Calculated) : 75.3  Temp (24hrs), Avg:98.1 F (36.7 C), Min:97.7 F (36.5 C), Max:98.5 F (36.9 C)  Recent Labs  Lab 08/04/18 2002 08/05/18 0637  WBC 6.2 6.1  CREATININE 1.30* 1.08    Estimated Creatinine Clearance: 57.4 mL/min (by C-G formula based on SCr of 1.08 mg/dL).    Allergies  Allergen Reactions  . Ciprofloxacin     Thank you for allowing pharmacy to be a part of this patient's care.  Lenis Noon, PharmD 08/05/2018 2:07 PM

## 2018-08-05 NOTE — Progress Notes (Signed)
New Admission Note:    Arrival Method: Bed Mental Orientation:  A & O X 4 Telemetry: n/a Assessment:  completed Skin: dry, flaky stage 1 sacrum Iv: R AC Pain: 8/10 Tubes:  n/a Safety Measures: Safety Fall Prevention Plan has been given, discussed and signed Admission: Completed [facility] Orientation: Patient has been orientated to the room, unit, and staff.  Family: none  Orders have been reviewed and implemented. Will continue to monitor the patient. Call light has been placed within reach and bed alarm has been activated.   Tawni Carnes, RN Phone number:  [phone]

## 2018-08-05 NOTE — Progress Notes (Signed)
Have seen and assessed patient and agree with Dr. Juleen China assessment and plan.  Patient is a 83 year old gentleman history of coronary artery disease, hypertension, prostate cancer status post radiation seed implantation, atrial fibrillation on Eliquis who presented to the ED for evaluation of hematuria.  Patient noted to have been seen by urology in the outpatient setting on 07/29/2018 and started empirically on cefuroxime mean for UTI while urine cultures were pending.  Patient with symptoms of dysuria and pelvic and back pain and gross hematuria.  Patient admitted urine cultures from urologist office noted to be positive for Pseudomonas and patient started empirically on IV Fortaz.  Eliquis on hold secondary to hematuria.  Continue pain management.  Supportive care.  We will have urine culture sensitivities faxed from urologist office.  No charge.

## 2018-08-05 NOTE — ED Notes (Signed)
ED TO INPATIENT HANDOFF REPORT  Name/Age/Gender Carl [name] 83 y.o. male  Code Status    Code Status Orders  (From admission, onward)         Start     Ordered   08/05/18 0007  Full code  Continuous     08/05/18 0007        Code Status History    Date Active Date Inactive Code Status Order ID Comments User Context   01/05/2018 0309 01/06/2018 2143 Full Code [id number]  Rise Patience, MD ED   03/12/2017 1628 03/17/2017 2043 Full Code [id number]  Murlean Iba, MD Inpatient      Home/SNF/Other Home  Chief Complaint Hematuria  Level of Care/Admitting Diagnosis ED Disposition    ED Disposition Condition [facility] [facility] Area: [facility] [[id number]]  Level of Care: Med-Surg [16]  Diagnosis: Pseudomonas urinary tract infection [008676]  Admitting Physician: Doreatha Massed  Attending Physician: Etta Quill [phone]  Estimated length of stay: past midnight tomorrow  Certification:: I certify this patient will need inpatient services for at least 2 midnights  PT Class (Do Not Modify): Inpatient [101]  PT Acc Code (Do Not Modify): Private [1]       Medical History Past Medical History:  Diagnosis Date  . AKI (acute kidney injury) ([place]) 01/01/2018  . Anemia   . Arthritis    arms  . Bladder calculus   . Chronic anxiety   . CKD (chronic kidney disease), stage III ([place])   . Community acquired pneumonia of right lower lobe of lung ([place]) 09/15/2017  . Coronary artery disease CARDIOLOGIST-  DR Loralie Champagne   a. CABG 2006 with  LIMA-LAD, SVG-Ramus, SVG-OM, seq SVG-PLV-PDA.  [name] GERD (gastroesophageal reflux disease)   . History of prostate cancer urologist-  dr Risa Grill   hx radioactive prostate seed implants 1997--  currently on Lupron injection  . Hyperlipidemia   . Hyperplasia of prostate without lower urinary tract symptoms (LUTS)   . Hypertension   . Ischemic cardiomyopathy    echo (01/13) 45-50% septal akinesis,  mild MR/  echo  (01/16) ef 60-65%, mid-apical anteroseptal hypokinesis  . Lung nodule   . Moderate mitral regurgitation   . Orthostatic hypotension   . PAC (premature atrial contraction)   . PAF (paroxysmal atrial fibrillation) ([place])    a. prior DCCV 10/2016.  . Paroxysmal atrial flutter ([place])    a. dx 02/2017, started on amiodarone.  . S/P CABG x 5 04/04/2005   LIMA to LAD,  SVG to Ramus,  SVG to OM,  seqSVG to PLV and PDA  . Wears glasses     Allergies Allergies  Allergen Reactions  . Ciprofloxacin     IV Location/Drains/Wounds Patient Lines/Drains/Airways Status   Active Line/Drains/Airways    Name:   Placement date:   Placement time:   Site:   Days:   Peripheral IV 08/04/18 Right Antecubital   08/04/18    2038    Antecubital   1   Midline Single Lumen 01/06/18 Midline Right Basilic 8 cm 0 cm   93/26/71    2458    Basilic   099   Incision (Closed) 05/07/16 Penis Other (Comment)   05/07/16    1011     31          Labs/Imaging Results for orders placed or performed during the hospital encounter of 08/04/18 (from the past 48 hour(s))  Urinalysis, Routine w reflex microscopic  Status: Abnormal   Collection Time: 08/04/18  8:02 PM  Result Value Ref Range   Color, Urine RED (A) YELLOW    Comment: BIOCHEMICALS MAY BE AFFECTED BY COLOR   APPearance TURBID (A) CLEAR   Specific Gravity, Urine 1.015 1.005 - 1.030   pH 6.5 5.0 - 8.0   Glucose, UA 100 (A) NEGATIVE mg/dL   Hgb urine dipstick LARGE (A) NEGATIVE   Bilirubin Urine LARGE (A) NEGATIVE   Ketones, ur 15 (A) NEGATIVE mg/dL   Protein, ur >300 (A) NEGATIVE mg/dL   Nitrite POSITIVE (A) NEGATIVE   Leukocytes, UA LARGE (A) NEGATIVE    Comment: Performed at [facility], [place] [address] [place], [place] [address]  Basic metabolic panel     Status: Abnormal   Collection Time: 08/04/18  8:02 PM  Result Value Ref Range   Sodium 139 135 - 145 mmol/L   Potassium 4.1 3.5 - 5.1 mmol/L   Chloride 108 98 -  111 mmol/L   CO2 23 22 - 32 mmol/L   Glucose, Bld 111 (H) 70 - 99 mg/dL   BUN 17 8 - 23 mg/dL   Creatinine, Ser 1.30 (H) 0.61 - 1.24 mg/dL   Calcium 8.9 8.9 - 10.3 mg/dL   GFR calc non Af Amer 52 (L) >60 mL/min   GFR calc Af Amer 60 (L) >60 mL/min   Anion gap 8 5 - 15    Comment: Performed at [place] [address] [place], [place] [address]  CBC with Differential     Status: Abnormal   Collection Time: 08/04/18  8:02 PM  Result Value Ref Range   WBC 6.2 4.0 - 10.5 K/uL   RBC 3.76 (L) 4.22 - 5.81 MIL/uL   Hemoglobin 12.0 (L) 13.0 - 17.0 g/dL   HCT 38.0 (L) 39.0 - 52.0 %   MCV 101.1 (H) 80.0 - 100.0 fL   MCH 31.9 26.0 - 34.0 pg   MCHC 31.6 30.0 - 36.0 g/dL   RDW 14.4 11.5 - 15.5 %   Platelets 161 150 - 400 K/uL   nRBC 0.0 0.0 - 0.2 %   Neutrophils Relative % 83 %   Neutro Abs 5.2 1.7 - 7.7 K/uL   Lymphocytes Relative 9 %   Lymphs Abs 0.5 (L) 0.7 - 4.0 K/uL   Monocytes Relative 7 %   Monocytes Absolute 0.4 0.1 - 1.0 K/uL   Eosinophils Relative 1 %   Eosinophils Absolute 0.0 0.0 - 0.5 K/uL   Basophils Relative 0 %   Basophils Absolute 0.0 0.0 - 0.1 K/uL   Immature Granulocytes 0 %   Abs Immature Granulocytes 0.02 0.00 - 0.07 K/uL    Comment: Performed at [facility], [place] [address], [place], [place] [address]  Urinalysis, Microscopic (reflex)     Status: Abnormal   Collection Time: 08/04/18  8:02 PM  Result Value Ref Range   RBC / HPF >50 0 - 5 RBC/hpf   WBC, UA 11-20 0 - 5 WBC/hpf   Bacteria, UA MANY (A) NONE SEEN   Squamous Epithelial / LPF 0-5 0 - 5    Comment: Performed at [facility], [place] [address] [place], [place] [address]   No results found.  Pending Labs Unresulted Labs (From admission, onward)    Start     Ordered   08/05/18 0500  CBC  Tomorrow morning,   R     08/05/18 0007   08/05/18 0500  Basic metabolic panel  Tomorrow morning,   R     08/05/18 0007   08/04/18 2002  Urine culture  ONCE  - STAT,   STAT     08/04/18 2001          Vitals/Pain Today's Vitals   08/04/18 2301 08/04/18 2330 08/05/18 0000 08/05/18 0030  BP:  130/74 116/75 (!) 161/79  Pulse:    65  Resp:    17  Temp:      TempSrc:      SpO2:    96%  Weight:      Height:      PainSc: 10-Worst pain ever       Isolation Precautions No active isolations  Medications Medications  ondansetron (ZOFRAN) tablet 4 mg (has no administration in time range)    Or  ondansetron (ZOFRAN) injection 4 mg (has no administration in time range)  amiodarone (PACERONE) tablet 200 mg (has no administration in time range)  rosuvastatin (CRESTOR) tablet 40 mg (has no administration in time range)  pantoprazole (PROTONIX) EC tablet 80 mg (has no administration in time range)  tamsulosin (FLOMAX) capsule 0.4 mg (has no administration in time range)  acetaminophen (TYLENOL) tablet 1,000 mg (has no administration in time range)  cefTAZidime (FORTAZ) 1 g in sodium chloride 0.9 % 100 mL IVPB (has no administration in time range)  morphine 2 MG/ML injection 2-4 mg (has no administration in time range)  morphine 2 MG/ML injection 2 mg (2 mg Intravenous Given 08/04/18 2259)  morphine 2 MG/ML injection 2 mg (2 mg Intravenous Given 08/05/18 0040)    Mobility walks with device

## 2018-08-06 ENCOUNTER — Encounter (HOSPITAL_COMMUNITY): Payer: Self-pay

## 2018-08-06 DIAGNOSIS — A498 Other bacterial infections of unspecified site: Secondary | ICD-10-CM

## 2018-08-06 DIAGNOSIS — N390 Urinary tract infection, site not specified: Secondary | ICD-10-CM | POA: Diagnosis not present

## 2018-08-06 DIAGNOSIS — N3001 Acute cystitis with hematuria: Secondary | ICD-10-CM | POA: Diagnosis not present

## 2018-08-06 DIAGNOSIS — I4819 Other persistent atrial fibrillation: Secondary | ICD-10-CM | POA: Diagnosis not present

## 2018-08-06 DIAGNOSIS — L89151 Pressure ulcer of sacral region, stage 1: Secondary | ICD-10-CM

## 2018-08-06 DIAGNOSIS — R310 Gross hematuria: Secondary | ICD-10-CM | POA: Diagnosis not present

## 2018-08-06 LAB — CBC WITH DIFFERENTIAL/PLATELET
Abs Immature Granulocytes: 0.03 10*3/uL (ref 0.00–0.07)
Basophils Absolute: 0 10*3/uL (ref 0.0–0.1)
Basophils Relative: 0 %
Eosinophils Absolute: 0 10*3/uL (ref 0.0–0.5)
Eosinophils Relative: 1 %
HCT: 36.1 % — ABNORMAL LOW (ref 39.0–52.0)
Hemoglobin: 11.5 g/dL — ABNORMAL LOW (ref 13.0–17.0)
Immature Granulocytes: 0 %
Lymphocytes Relative: 12 %
Lymphs Abs: 0.8 10*3/uL (ref 0.7–4.0)
MCH: 31.6 pg (ref 26.0–34.0)
MCHC: 31.9 g/dL (ref 30.0–36.0)
MCV: 99.2 fL (ref 80.0–100.0)
Monocytes Absolute: 0.5 10*3/uL (ref 0.1–1.0)
Monocytes Relative: 8 %
Neutro Abs: 5.5 10*3/uL (ref 1.7–7.7)
Neutrophils Relative %: 79 %
Platelets: 134 10*3/uL — ABNORMAL LOW (ref 150–400)
RBC: 3.64 MIL/uL — ABNORMAL LOW (ref 4.22–5.81)
RDW: 14.3 % (ref 11.5–15.5)
WBC: 6.9 10*3/uL (ref 4.0–10.5)
nRBC: 0 % (ref 0.0–0.2)

## 2018-08-06 LAB — URINE CULTURE

## 2018-08-06 LAB — BASIC METABOLIC PANEL
Anion gap: 7 (ref 5–15)
BUN: 16 mg/dL (ref 8–23)
CO2: 24 mmol/L (ref 22–32)
Calcium: 8.5 mg/dL — ABNORMAL LOW (ref 8.9–10.3)
Chloride: 105 mmol/L (ref 98–111)
Creatinine, Ser: 1.22 mg/dL (ref 0.61–1.24)
GFR calc Af Amer: >60 mL/min (ref >60)
GFR calc non Af Amer: 56 mL/min — ABNORMAL LOW (ref >60)
Glucose, Bld: 103 mg/dL — ABNORMAL HIGH (ref 70–99)
Potassium: 3.8 mmol/L (ref 3.5–5.1)
Sodium: 136 mmol/L (ref 135–145)

## 2018-08-06 MED ORDER — OXYCODONE HCL 5 MG PO TABS
5.0000 mg | ORAL_TABLET | Freq: Four times a day (QID) | ORAL | Status: DC | PRN
  Administered 2018-08-06 – 2018-08-09 (×7): 5 mg via ORAL
  Filled 2018-08-06 (×8): qty 1

## 2018-08-06 MED ORDER — DIPHENHYDRAMINE HCL 25 MG PO CAPS
25.0000 mg | ORAL_CAPSULE | Freq: Three times a day (TID) | ORAL | Status: DC | PRN
  Administered 2018-08-06 – 2018-08-07 (×3): 25 mg via ORAL
  Filled 2018-08-06 (×4): qty 1

## 2018-08-06 MED ORDER — ACETAMINOPHEN 500 MG PO TABS
500.0000 mg | ORAL_TABLET | Freq: Three times a day (TID) | ORAL | Status: DC
  Administered 2018-08-06 – 2018-08-09 (×8): 500 mg via ORAL
  Filled 2018-08-06 (×9): qty 1

## 2018-08-06 MED ORDER — ACETAMINOPHEN-CODEINE #3 300-30 MG PO TABS: 1.0000 | ORAL_TABLET | ORAL | Status: DC | PRN

## 2018-08-06 MED ORDER — SODIUM CHLORIDE 0.9 % IV SOLN
INTRAVENOUS | Status: DC | PRN
  Administered 2018-08-06 – 2018-08-09 (×2): 250 mL via INTRAVENOUS

## 2018-08-06 MED ORDER — HYDROCORTISONE 1 % EX CREA
TOPICAL_CREAM | Freq: Four times a day (QID) | CUTANEOUS | Status: DC
  Administered 2018-08-06 – 2018-08-09 (×12): via TOPICAL
  Filled 2018-08-06: qty 28

## 2018-08-06 MED ORDER — MUSCLE RUB 10-15 % EX CREA
TOPICAL_CREAM | CUTANEOUS | Status: DC | PRN
  Administered 2018-08-06 – 2018-08-08 (×3): via TOPICAL
  Administered 2018-08-09: 1 via TOPICAL
  Filled 2018-08-06: qty 85

## 2018-08-06 NOTE — Progress Notes (Signed)
PT Cancellation Note  Patient Details Name: Carl [name] MRN: [id number] DOB: 10-14-1935   Cancelled Treatment:     PT order received but eval deferred this date.  Pt declined x 2 - just back to bed and "I've just had pain medicine so I'm afraid to walk".  Will follow.   Gates Jividen 08/06/2018, 12:05 PM

## 2018-08-06 NOTE — Progress Notes (Addendum)
PROGRESS NOTE    [name]  GBT:[id number] DOB: 05/19/1936 DOA: 08/04/2018 PCP: [name], MD   Brief Narrative:  Per Dr. [name] is a 83 y.o. male with medical history significant of CAD, HTN, prostate Ca, rad seed implant, A.Fib on eliquis.  Patient presented to the ED for evaluation of hematuria.  Has been having hematuria, saw [facility] for this.  Started on empiric cefuroxime on 07/29/18 and UCx obtained at that time.  Since then symptoms have persisted including dysuria, 6/10 pelvic and back pain, gross hematuria.  Presented to ED for ongoing symtpoms.   ED Course: On discussion with urologist, turns out the culture grew out Pseudomonas (which explains why cefuroxime didn't work).  P. Aureg was resistant to Cipro, intermediate to Levoquin, sensitive to: amacasin, azotranam, ceftazadine. Cefepime, gent, mero, zosyn and tobra.  Patient allergic to cipro anyhow.   Assessment & Plan:   Principal Problem:   Pseudomonas urinary tract infection Active Problems:   Persistent atrial fibrillation   Urolithiasis   Hematuria   Pressure injury of skin   Pseudomonas aeruginosa infection   Acute cystitis with hematuria  1 Pseudomonas UTI with hematuria Patient noted to have a Pseudomonas UTI and presented with hematuria.  Culture sensitive to [place].  Patient started on IV Fortaz.  Renal ultrasound limited however negative for hydronephrosis.  Left kidney not well evaluated.  Renal stones on prior CT not well visualized sonographically.  Right renal cyst.  Cyst in the left kidney on CT not well seen.  Patient currently afebrile.  Patient allergic to oral ciprofloxacin and as such needing IV antibiotics.  Continue IV Fortaz day 2/7.  Patient frail, cachectic with weakness.  Will need outpatient follow-up with urology.  2.  Hematuria Likely secondary to problem #1 in the setting of anticoagulation with Eliquis.  Hematuria slowly improving.   Hemoglobin at 11.5.  Follow H&H.  3.  Persistent atrial fibrillation Rate controlled on amiodarone.  Anticoagulation of Eliquis on hold as patient had presented with gross hematuria.  Outpatient follow-up.  4.  Pressure injury of the skin/stage I pressure injury sacrum Continue current wound care.  5.  Low back pain Patient with complaints of low back pain likely musculoskeletal pain in nature.  Placed on scheduled Tylenol 500 mg 3 times daily.  Oxycodone as needed for breakthrough pain.  Heat compresses.    DVT prophylaxis: SCDs Code Status: Full Family Communication: Updated patient.  No family at bedside. Disposition Plan: Home once patient has completed course of IV antibiotics.  Patient allergic to ciprofloxacin and as such unable to be treated with oral antibiotics.    Consultants:   None  Procedures:   Renal ultrasound 08/05/2018  Antimicrobials:  IV Tressie Ellis 08/05/2018   Subjective: Patient in bed.  Patient complain of some back pain and some neck pain.  Patient with some improvement with hematuria.  Patient denies any chest pain or shortness of breath.  Complains of dysuria which is slowly improving.  Objective: Vitals:   08/05/18 2051 08/06/18 0640 08/06/18 1007 08/06/18 1404  BP: 133/75 (!) 128/58 106/60 (!) 110/58  Pulse: 69 74 70 68  Resp: 17 15 14 15   Temp: 98.3 F (36.8 C) 98.3 F (36.8 C)  98.6 F (37 C)  TempSrc:  Oral  Oral  SpO2: 96% 99% 98% 96%  Weight:  77.6 kg    Height:        Intake/Output Summary (Last 24 hours) at 08/06/2018 1739 Last data  filed at 08/06/2018 1619 Gross per 24 hour  Intake 680 ml  Output 2550 ml  Net -1870 ml   Filed Weights   08/04/18 1952 08/06/18 0640  Weight: 74.4 kg 77.6 kg    Examination:  General exam: Chronically ill-appearing.  Frail.  Cachectic. Respiratory system: Lungs clear to auscultation bilaterally.  No wheezes, no crackles, no rhonchi.  Normal respiratory effort.  Cardiovascular system: Regular rate  rhythm no murmurs rubs or gallops.  No JVD.  No lower extremity edema.  Gastrointestinal system: Abdomen is soft, nontender, nondistended, positive bowel sounds.  No rebound.  Central nervous system: Alert and oriented. No focal neurological deficits. Extremities: Symmetric 5 x 5 power. Musculoskeletal: Lower back with significant tenderness to palpation. Skin: No rashes, lesions or ulcers Psychiatry: Judgement and insight appear normal. Mood & affect appropriate.     Data Reviewed: I have personally reviewed following labs and imaging studies  CBC: Recent Labs  Lab 08/04/18 2002 08/05/18 0637 08/06/18 0648  WBC 6.2 6.1 6.9  NEUTROABS 5.2  --  5.5  HGB 12.0* 11.0* 11.5*  HCT 38.0* 35.5* 36.1*  MCV 101.1* 102.6* 99.2  PLT 161 140* 599*   Basic Metabolic Panel: Recent Labs  Lab 08/04/18 2002 08/05/18 0637 08/06/18 0648  NA 139 139 136  K 4.1 3.8 3.8  CL 108 109 105  CO2 23 20* 24  GLUCOSE 111* 89 103*  BUN 17 15 16   CREATININE 1.30* 1.08 1.22  CALCIUM 8.9 8.5* 8.5*   GFR: Estimated Creatinine Clearance: 51.4 mL/min (by C-G formula based on SCr of 1.22 mg/dL). Liver Function Tests: No results for input(s): AST, ALT, ALKPHOS, BILITOT, PROT, ALBUMIN in the last 168 hours. No results for input(s): LIPASE, AMYLASE in the last 168 hours. No results for input(s): AMMONIA in the last 168 hours. Coagulation Profile: No results for input(s): INR, PROTIME in the last 168 hours. Cardiac Enzymes: No results for input(s): CKTOTAL, CKMB, CKMBINDEX, TROPONINI in the last 168 hours. BNP (last 3 results) No results for input(s): PROBNP in the last 8760 hours. HbA1C: No results for input(s): HGBA1C in the last 72 hours. CBG: No results for input(s): GLUCAP in the last 168 hours. Lipid Profile: No results for input(s): CHOL, HDL, LDLCALC, TRIG, CHOLHDL, LDLDIRECT in the last 72 hours. Thyroid Function Tests: No results for input(s): TSH, T4TOTAL, FREET4, T3FREE, THYROIDAB in the  last 72 hours. Anemia Panel: No results for input(s): VITAMINB12, FOLATE, FERRITIN, TIBC, IRON, RETICCTPCT in the last 72 hours. Sepsis Labs: No results for input(s): PROCALCITON, LATICACIDVEN in the last 168 hours.  Recent Results (from the past 240 hour(s))  Urine culture     Status: Abnormal   Collection Time: 08/04/18  8:02 PM  Result Value Ref Range Status   Specimen Description   Final    URINE, CLEAN CATCH Performed at [facility], [place] [address], [place], [place] [address]    Special Requests   Final    NONE Performed at [facility], [place] [address], [place], [place] [address]    Culture MULTIPLE SPECIES PRESENT, SUGGEST RECOLLECTION (A)  Final   Report Status 08/06/2018 FINAL  Final         Radiology Studies: US Renal  Result Date: 08/05/2018 CLINICAL DATA:  History of kidney stones. Evaluate for edema. Hematuria. EXAM: RENAL / URINARY TRACT ULTRASOUND COMPLETE COMPARISON:  Abdominal CT 06/18/2018 FINDINGS: Right Kidney: Renal measurements: 11.4 x 5.1 x 5.2 cm = volume: 161 mL. Thinning of the  renal parenchyma. No hydronephrosis. Exophytic cyst measures 4.3 cm. Stones in the right kidney on prior CT are not well visualize sonographically. Left Kidney: Renal measurements: 12.2 x 5.7 x 3.8 cm = volume: 139 mL. Left kidney is not well visualized due to rib shadowing. No hydronephrosis. Known stones on CT are not well visualized. Known cyst on CT is not well visualized. Bladder: Nondistended and not well evaluated. IMPRESSION: 1. Limited sonographic evaluation of the kidneys demonstrates no evidence of hydronephrosis. Particularly, the left kidney is not well evaluated. 2. Renal stones on prior CT are not well visualized sonographically. 3. Right renal cyst. Cysts in the left kidney on CT are not well seen. Electronically Signed   By: Keith Rake M.D.   On: 08/05/2018 00:54        Scheduled Meds: . acetaminophen  500 mg Oral Q8H  .  amiodarone  200 mg Oral Daily  . pantoprazole  80 mg Oral Daily  . rosuvastatin  40 mg Oral Daily  . tamsulosin  0.4 mg Oral QPC supper   Continuous Infusions: . sodium chloride 250 mL (08/06/18 0505)  . cefTAZidime (FORTAZ)  IV 2 g (08/06/18 1350)     LOS: 1 day    Time spent: 35 minutes    Irine Seal, MD [facility]  If 7PM-7AM, please contact night-coverage [email or web address] Password [facility] 08/06/2018, 5:39 PM

## 2018-08-06 NOTE — Evaluation (Signed)
Occupational Therapy Evaluation Patient Details Name: Carl [name] MRN: [id number] DOB: 1936-06-29 Today's Date: 08/06/2018    History of Present Illness Carl [name] is a 83 y.o. male with medical history significant of CAD, HTN, prostate Ca, rad seed implant, A.Fib on eliquis..  Pt admitted to [facility] with hematuria    Clinical Impression   Pt admitted with hematuria. Pt currently with functional limitations due to the deficits listed below (see OT Problem List).  Pt will benefit from skilled OT to increase their safety and independence with ADL and functional mobility for ADL to facilitate discharge to venue listed below.   Pts daughter will provide A as needed as well as grandson     Follow Up Recommendations  Home health OT;Supervision/Assistance - 24 hour    Equipment Recommendations  3 in 1 bedside commode    Recommendations for Other Services       Precautions / Restrictions Precautions Precautions: Fall      Mobility Bed Mobility Overal bed mobility: Needs Assistance Bed Mobility: Supine to Sit     Supine to sit: Mod assist        Transfers Overall transfer level: Needs assistance Equipment used: Rolling walker (2 wheeled) Transfers: Sit to/from [facility] Sit to Stand: Mod assist;Min assist Stand pivot transfers: Mod assist;Min assist                ADL either performed or assessed with clinical judgement   ADL Overall ADL's : Needs assistance/impaired Eating/Feeding: Set up;Sitting   Grooming: Minimal assistance;Sitting   Upper Body Bathing: Minimal assistance;Sitting   Lower Body Bathing: Maximal assistance;Sit to/from stand;Cueing for sequencing   Upper Body Dressing : Minimal assistance;Sitting   Lower Body Dressing: Maximal assistance;Sit to/from stand;Cueing for sequencing;Cueing for safety   Toilet Transfer: Minimal assistance;RW;Comfort height toilet;Stand-pivot   Toileting- Clothing Manipulation and  Hygiene: Moderate assistance;Sit to/from stand;Cueing for sequencing;Cueing for safety         General ADL Comments: Pt agreeable to get to chair.     Vision Patient Visual Report: No change from baseline              Pertinent Vitals/Pain Pain Assessment: 0-10 Pain Score: 4  Pain Location: back Pain Descriptors / Indicators: Sore Pain Intervention(s): Repositioned;Monitored during session     Hand Dominance     Extremity/Trunk Assessment Upper Extremity Assessment Upper Extremity Assessment: Generalized weakness           Communication Communication Communication: No difficulties   Cognition Arousal/Alertness: Awake/alert Behavior During Therapy: WFL for tasks assessed/performed Overall Cognitive Status: Within Functional Limits for tasks assessed                                                Home Living Family/patient expects to be discharged to:: Private residence Living Arrangements: Children Available Help at Discharge: Family Type of Home: House Home Access: Stairs to enter Technical brewer of Steps: 6 Entrance Stairs-Rails: Right Home Layout: One level     Bathroom Shower/Tub: Walk-in shower         Home Equipment: Shower seat;Cane - single point;Walker - 2 wheels          Prior Functioning/Environment Level of Independence: Independent                 OT Problem List: Decreased strength;Decreased activity tolerance;Decreased safety  awareness;Impaired balance (sitting and/or standing);Decreased knowledge of use of DME or AE;Pain      OT Treatment/Interventions: Self-care/ADL training;Patient/family education;Therapeutic activities;DME and/or AE instruction    OT Goals(Current goals can be found in the care plan section) Acute Rehab OT Goals Patient Stated Goal: home OT Goal Formulation: With patient Time For Goal Achievement: 08/20/18 Potential to Achieve Goals: Good  OT Frequency: Min 2X/week    Barriers to D/C: (back)          Co-evaluation              AM-PAC OT "6 Clicks" Daily Activity     Outcome Measure Help from another person eating meals?: None Help from another person taking care of personal grooming?: A Little Help from another person toileting, which includes using toliet, bedpan, or urinal?: A Little Help from another person bathing (including washing, rinsing, drying)?: A Little Help from another person to put on and taking off regular upper body clothing?: A Little Help from another person to put on and taking off regular lower body clothing?: A Lot 6 Click Score: 18   End of Session Equipment Utilized During Treatment: Rolling walker;Gait belt Nurse Communication: Mobility status  Activity Tolerance: Patient tolerated treatment well Patient left: in chair;with call bell/phone within reach  OT Visit Diagnosis: Unsteadiness on feet (R26.81);Other abnormalities of gait and mobility (R26.89);Muscle weakness (generalized) (M62.81);History of falling (Z91.81)                Time: 0940-1005 OT Time Calculation (min): 25 min Charges:  OT General Charges $OT Visit: 1 Visit OT Evaluation $OT Eval Moderate Complexity: 1 Mod OT Treatments $Self Care/Home Management : 8-22 mins  Kari Baars, OT Acute Rehabilitation Services [phone] Office- [phone]     Seriyah Collison, Edwena Felty D 08/06/2018, 11:14 AM

## 2018-08-07 DIAGNOSIS — N3001 Acute cystitis with hematuria: Secondary | ICD-10-CM | POA: Diagnosis not present

## 2018-08-07 DIAGNOSIS — I4819 Other persistent atrial fibrillation: Secondary | ICD-10-CM | POA: Diagnosis not present

## 2018-08-07 DIAGNOSIS — N390 Urinary tract infection, site not specified: Secondary | ICD-10-CM | POA: Diagnosis not present

## 2018-08-07 DIAGNOSIS — R310 Gross hematuria: Secondary | ICD-10-CM | POA: Diagnosis not present

## 2018-08-07 LAB — BASIC METABOLIC PANEL WITH GFR
Anion gap: 10 (ref 5–15)
BUN: 16 mg/dL (ref 8–23)
CO2: 23 mmol/L (ref 22–32)
Calcium: 8.8 mg/dL — ABNORMAL LOW (ref 8.9–10.3)
Chloride: 102 mmol/L (ref 98–111)
Creatinine, Ser: 1.08 mg/dL (ref 0.61–1.24)
GFR calc Af Amer: >60 mL/min (ref >60)
GFR calc non Af Amer: >60 mL/min (ref >60)
Glucose, Bld: 103 mg/dL — ABNORMAL HIGH (ref 70–99)
Potassium: 3.9 mmol/L (ref 3.5–5.1)
Sodium: 135 mmol/L (ref 135–145)

## 2018-08-07 LAB — CBC
HCT: 36.2 % — ABNORMAL LOW (ref 39.0–52.0)
Hemoglobin: 11.6 g/dL — ABNORMAL LOW (ref 13.0–17.0)
MCH: 32 pg (ref 26.0–34.0)
MCHC: 32 g/dL (ref 30.0–36.0)
MCV: 99.7 fL (ref 80.0–100.0)
Platelets: 143 K/uL — ABNORMAL LOW (ref 150–400)
RBC: 3.63 MIL/uL — ABNORMAL LOW (ref 4.22–5.81)
RDW: 14.2 % (ref 11.5–15.5)
WBC: 8.4 K/uL (ref 4.0–10.5)
nRBC: 0 % (ref 0.0–0.2)

## 2018-08-07 MED ORDER — BELLADONNA ALKALOIDS-OPIUM 16.2-60 MG RE SUPP
1.0000 | Freq: Four times a day (QID) | RECTAL | Status: DC | PRN
  Administered 2018-08-07 (×2): 1 via RECTAL
  Filled 2018-08-07 (×2): qty 1

## 2018-08-07 MED ORDER — PHENAZOPYRIDINE HCL 100 MG PO TABS
100.0000 mg | ORAL_TABLET | Freq: Three times a day (TID) | ORAL | Status: DC
  Administered 2018-08-07 – 2018-08-09 (×6): 100 mg via ORAL
  Filled 2018-08-07 (×10): qty 1

## 2018-08-07 NOTE — Evaluation (Signed)
Physical Therapy Evaluation Patient Details Name: Carl [name] MRN: [id number] DOB: 01/09/1936 Today's Date: 08/07/2018   History of Present Illness  [name] is a 83 y.o. male with medical history significant of CAD, HTN, prostate Ca, rad seed implant, A.Fib on eliquis..  Pt admitted to [facility] with hematuria   Clinical Impression  Pt admitted with above diagnosis. Pt currently with functional limitations due to the deficits listed below (see PT Problem List).  Pt is limited ambulator at home, states he does not walk much, he likes to watch a lot of TV.  Will follow in acute setting to incr strength and independence, pt is currently refusing [facility] services  Pt will benefit from skilled PT to increase their independence and safety with mobility to allow discharge to the venue listed below.     Follow Up Recommendations Other (comment)(would benefit from HHPT--pt refuses)    Equipment Recommendations  None recommended by PT    Recommendations for Other Services       Precautions / Restrictions Precautions Precautions: Fall Restrictions Weight Bearing Restrictions: No      Mobility  Bed Mobility Overal bed mobility: Needs Assistance Bed Mobility: Supine to Sit;Sit to Supine     Supine to sit: Mod assist Sit to supine: Mod assist   General bed mobility comments: incr difficulty with supine<>sit transitions, assist needed for trunk and LEs --pt sleeps in recliner at home, core weakness causing difficulty with bed mobility  Transfers Overall transfer level: Needs assistance Equipment used: Rolling walker (2 wheeled) Transfers: Sit to/from Stand Sit to Stand: Min assist         General transfer comment: light assist for anterior-superior wt shift,incr time  Ambulation/Gait Ambulation/Gait assistance: Min guard;Min assist Gait Distance (Feet): 120 Feet Assistive device: Rolling walker (2 wheeled) Gait Pattern/deviations: Step-through pattern;Trunk  flexed;Decreased stride length     General Gait Details: cues for RW position and posture, stability improved with incr distance, no overt LOB  Stairs            Wheelchair Mobility    Modified Rankin (Stroke Patients Only)       Balance Overall balance assessment: Needs assistance Sitting-balance support: No upper extremity supported;Feet supported Sitting balance-Leahy Scale: Fair     Standing balance support: Single extremity supported;Bilateral upper extremity supported Standing balance-Leahy Scale: Poor Standing balance comment: reliant on at least unilateral UE support                             Pertinent Vitals/Pain Pain Assessment: 0-10 Pain Score: 4  Pain Location: back Pain Descriptors / Indicators: Discomfort;Sore Pain Intervention(s): Monitored during session;Repositioned    Home Living Family/patient expects to be discharged to:: Private residence Living Arrangements: Children Available Help at Discharge: Family Type of Home: House Home Access: Stairs to enter Entrance Stairs-Rails: Right Entrance Stairs-Number of Steps: 6 Home Layout: One level Home Equipment: Shower seat;Cane - single point;Walker - 2 wheels      Prior Function Level of Independence: Independent         Comments: reports he does not move around much at home, sleeps in recliner, does not amb with RW     Hand Dominance        Extremity/Trunk Assessment   Upper Extremity Assessment Upper Extremity Assessment: Generalized weakness;Defer to OT evaluation    Lower Extremity Assessment Lower Extremity Assessment: Generalized weakness       Communication   Communication: No  difficulties  Cognition Arousal/Alertness: Awake/alert Behavior During Therapy: WFL for tasks assessed/performed Overall Cognitive Status: Within Functional Limits for tasks assessed                                        General Comments      Exercises      Assessment/Plan    PT Assessment Patient needs continued PT services  PT Problem List Decreased strength;Decreased mobility;Decreased activity tolerance;Decreased knowledge of use of DME;Decreased balance       PT Treatment Interventions DME instruction;Functional mobility training;Gait training;Therapeutic activities;Patient/family education;Therapeutic exercise;Balance training    PT Goals (Current goals can be found in the Care Plan section)  Acute Rehab PT Goals Patient Stated Goal: home PT Goal Formulation: With patient Time For Goal Achievement: 08/21/18 Potential to Achieve Goals: Good    Frequency Min 3X/week   Barriers to discharge        Co-evaluation               AM-PAC PT "6 Clicks" Mobility  Outcome Measure Help needed turning from your back to your side while in a flat bed without using bedrails?: A Lot Help needed moving from lying on your back to sitting on the side of a flat bed without using bedrails?: A Lot Help needed moving to and from a bed to a chair (including a wheelchair)?: A Little Help needed standing up from a chair using your arms (e.g., wheelchair or bedside chair)?: A Little Help needed to walk in hospital room?: A Little Help needed climbing 3-5 steps with a railing? : A Lot 6 Click Score: 15    End of Session Equipment Utilized During Treatment: Gait belt Activity Tolerance: Patient tolerated treatment well Patient left: in bed;with call bell/phone within reach;with bed alarm set   PT Visit Diagnosis: Unsteadiness on feet (R26.81)    Time: 0623-7628 PT Time Calculation (min) (ACUTE ONLY): 12 min   Charges:   PT Evaluation $PT Eval Low Complexity: 1 Low          Kenyon Ana, PT  Pager: [phone] Acute Rehab Dept [facility]): [phone]   08/07/2018   [facility] 08/07/2018, 1:44 PM

## 2018-08-07 NOTE — Care Management Obs Status (Signed)
[place] NOTIFICATION   Patient Details  Name: Carl [name] MRN: [id number] Date of Birth: 1935-09-15   Medicare Observation Status Notification Given:  Yes    Erenest Rasher, RN 08/07/2018, 9:47 AM

## 2018-08-07 NOTE — Care Management CC44 (Signed)
Condition Code 44 Documentation Completed  Patient Details  Name: GERADO NABERS MRN: [id number] Date of Birth: 12-26-1935   Condition Code 44 given:  Yes Patient signature on Condition Code 44 notice:  Yes Documentation of 2 MD's agreement:  Yes Code 44 added to claim:  Yes    Erenest Rasher, RN 08/07/2018, 9:47 AM

## 2018-08-07 NOTE — Progress Notes (Signed)
PROGRESS NOTE    Carl [name]  HYQ:[id number] DOB: 03-25-1936 DOA: 08/04/2018 PCP: [name], MD   Brief Narrative:  Per Dr. [name] is a 83 y.o. male with medical history significant of CAD, HTN, prostate Ca, rad seed implant, A.Fib on eliquis.  Patient presented to the ED for evaluation of hematuria.  Has been having hematuria, saw [facility].  Started on empiric cefuroxime on 07/29/18 and UCx obtained at that time.  Since then symptoms have persisted including dysuria, 6/10 pelvic and back pain, gross hematuria.  Presented to ED for ongoing symtpoms.   ED Course: On discussion with urologist, turns out the culture grew out Pseudomonas (which explains why cefuroxime didn't work).  P. Aureg was resistant to Cipro, intermediate to Levoquin, sensitive to: amacasin, azotranam, ceftazadine. Cefepime, gent, mero, zosyn and tobra.  Patient allergic to cipro anyhow.   Assessment & Plan:   Principal Problem:   Pseudomonas urinary tract infection Active Problems:   Persistent atrial fibrillation   Urolithiasis   Hematuria   Pressure injury of skin   Pseudomonas aeruginosa infection   Acute cystitis with hematuria  1 Pseudomonas UTI with hematuria Patient noted to have a Pseudomonas UTI and presented with hematuria.  Cultures sensitive to [employer].  Patient started on IV Fortaz.  Renal ultrasound limited however negative for hydronephrosis.  Left kidney not well evaluated.  Renal stones on prior CT not well visualized sonographically.  Right renal cyst.  Cyst in the left kidney on CT not well seen.  Patient currently afebrile.  Patient allergic to oral ciprofloxacin and as such needing IV antibiotics.  Continue IV Fortaz day 3/5-7.  Patient frail, cachectic with weakness.  Patient also with complaints of suprapubic lower abdominal pain and possibly bladder spasms.  Place on Pyridium.  B and O suppositories as needed.  Will need outpatient  follow-up with urology.  2.  Hematuria Likely secondary to problem #1 in the setting of anticoagulation with Eliquis.  Hematuria slowly improving.  Hemoglobin at 11.6.  Follow H&H.  3.  Persistent atrial fibrillation Continue amiodarone for rate control.  Anticoagulation of Eliquis on hold as patient had presented with gross hematuria.  Outpatient follow-up.  4.  Pressure injury of the skin/stage I pressure injury sacrum Continue current wound care.  5.  Low back pain Patient with complaints of low back pain likely musculoskeletal pain in nature.  Placed on scheduled Tylenol 500 mg 3 times daily.  Oxycodone as needed for breakthrough pain.  Heat compresses.    DVT prophylaxis: SCDs Code Status: Full Family Communication: Updated patient.  No family at bedside. Disposition Plan: Home once patient has completed course of IV antibiotics.  Patient allergic to ciprofloxacin and as such unable to be treated with oral antibiotics.    Consultants:   None  Procedures:   Renal ultrasound 08/05/2018  Antimicrobials:  IV Tressie Ellis 08/05/2018   Subjective: Patient sitting up in bed eating his lunch.  Denies any shortness of breath.  No chest pain.  Complaining of lower abdominal pain which is not controlled.  Patient also describing possibly bladder spasms.  Patient with dysuria which is improving.  Patient with some suprapubic abdominal pain.    Objective: Vitals:   08/06/18 1007 08/06/18 1404 08/06/18 2224 08/07/18 0420  BP: 106/60 (!) 110/58 140/76 (!) 144/81  Pulse: 70 68 79 82  Resp: 14 15 16 15   Temp:  98.6 F (37 C) 98.4 F (36.9 C) 100.2 F (37.9 C)  TempSrc:  Oral Oral Oral  SpO2: 98% 96% 98% 94%  Weight:      Height:        Intake/Output Summary (Last 24 hours) at 08/07/2018 1259 Last data filed at 08/07/2018 1010 Gross per 24 hour  Intake 560 ml  Output 3125 ml  Net -2565 ml   Filed Weights   08/04/18 1952 08/06/18 0640  Weight: 74.4 kg 77.6 kg     Examination:  General exam: Chronically ill-appearing.  Frail.  Cachectic. Respiratory system: CTAB.  No wheezes, no crackles, no rhonchi.  Normal respiratory effort. Cardiovascular system: RRR no murmurs rubs or gallops.  No JVD.  No lower extremity edema.  Gastrointestinal system: Abdomen is nondistended, soft, positive bowel sounds.  Some tenderness to palpation in the suprapubic region.  No rebound.  No guarding.  Central nervous system: Alert and oriented. No focal neurological deficits. Extremities: Symmetric 5 x 5 power. Musculoskeletal: Lower back with significant tenderness to palpation. Skin: No rashes, lesions or ulcers Psychiatry: Judgement and insight appear normal. Mood & affect appropriate.     Data Reviewed: I have personally reviewed following labs and imaging studies  CBC: Recent Labs  Lab 08/04/18 2002 08/05/18 2426 08/06/18 0648 08/07/18 0707  WBC 6.2 6.1 6.9 8.4  NEUTROABS 5.2  --  5.5  --   HGB 12.0* 11.0* 11.5* 11.6*  HCT 38.0* 35.5* 36.1* 36.2*  MCV 101.1* 102.6* 99.2 99.7  PLT 161 140* 134* 834*   Basic Metabolic Panel: Recent Labs  Lab 08/04/18 2002 08/05/18 0637 08/06/18 0648 08/07/18 0707  NA 139 139 136 135  K 4.1 3.8 3.8 3.9  CL 108 109 105 102  CO2 23 20* 24 23  GLUCOSE 111* 89 103* 103*  BUN 17 15 16 16   CREATININE 1.30* 1.08 1.22 1.08  CALCIUM 8.9 8.5* 8.5* 8.8*   GFR: Estimated Creatinine Clearance: 58.1 mL/min (by C-G formula based on SCr of 1.08 mg/dL). Liver Function Tests: No results for input(s): AST, ALT, ALKPHOS, BILITOT, PROT, ALBUMIN in the last 168 hours. No results for input(s): LIPASE, AMYLASE in the last 168 hours. No results for input(s): AMMONIA in the last 168 hours. Coagulation Profile: No results for input(s): INR, PROTIME in the last 168 hours. Cardiac Enzymes: No results for input(s): CKTOTAL, CKMB, CKMBINDEX, TROPONINI in the last 168 hours. BNP (last 3 results) No results for input(s): PROBNP in the  last 8760 hours. HbA1C: No results for input(s): HGBA1C in the last 72 hours. CBG: No results for input(s): GLUCAP in the last 168 hours. Lipid Profile: No results for input(s): CHOL, HDL, LDLCALC, TRIG, CHOLHDL, LDLDIRECT in the last 72 hours. Thyroid Function Tests: No results for input(s): TSH, T4TOTAL, FREET4, T3FREE, THYROIDAB in the last 72 hours. Anemia Panel: No results for input(s): VITAMINB12, FOLATE, FERRITIN, TIBC, IRON, RETICCTPCT in the last 72 hours. Sepsis Labs: No results for input(s): PROCALCITON, LATICACIDVEN in the last 168 hours.  Recent Results (from the past 240 hour(s))  Urine culture     Status: Abnormal   Collection Time: 08/04/18  8:02 PM  Result Value Ref Range Status   Specimen Description   Final    URINE, CLEAN CATCH Performed at [facility], [place] [address] [place], [place] [address]    Special Requests   Final    NONE Performed at [facility], [place] [address], [place], [place] [address]    Culture MULTIPLE SPECIES PRESENT, SUGGEST RECOLLECTION (A)  Final   Report Status 08/06/2018  FINAL  Final         Radiology Studies: No results found.      Scheduled Meds: . acetaminophen  500 mg Oral Q8H  . amiodarone  200 mg Oral Daily  . hydrocortisone cream   Topical QID  . pantoprazole  80 mg Oral Daily  . rosuvastatin  40 mg Oral Daily  . tamsulosin  0.4 mg Oral QPC supper   Continuous Infusions: . sodium chloride 250 mL (08/06/18 0505)  . cefTAZidime (FORTAZ)  IV 2 g (08/07/18 0708)     LOS: 1 day    Time spent: 35 minutes    Irine Seal, MD [facility]  If 7PM-7AM, please contact night-coverage [email or web address] Password [facility] 08/07/2018, 12:59 PM

## 2018-08-07 NOTE — Care Management Note (Signed)
Case Management Note  Patient Details  Name: KENIEL RALSTON MRN: [id number] Date of Birth: April 13, 1936  Subjective/Objective:    Pseudomonas UTI                Action/Plan: NCM spoke to pt and offered choice for HHPT/CMS list provided and placed on chart. Pt declines HH at this time. States his dtr is at home with him. Has RW. States he will participate in PT today. Made Unit RN aware pt wants to walk in halls.   PCP [name], ERIC G    Expected Discharge Date:                 Expected Discharge Plan:  [place]  In-House Referral:  NA  Discharge planning Services  CM Consult  Post Acute Care Choice:  Home Health Choice offered to:  Patient  DME Arranged:  N/A DME Agency:  NA  HH Arranged:  Patient Refused Central Agency:  NA  Status of Service:  Completed, signed off  If discussed at [place] of Stay Meetings, dates discussed:    Additional Comments:  Erenest Rasher, RN 08/07/2018, 9:49 AM

## 2018-08-08 DIAGNOSIS — I4819 Other persistent atrial fibrillation: Secondary | ICD-10-CM | POA: Diagnosis not present

## 2018-08-08 DIAGNOSIS — R310 Gross hematuria: Secondary | ICD-10-CM | POA: Diagnosis not present

## 2018-08-08 DIAGNOSIS — N390 Urinary tract infection, site not specified: Secondary | ICD-10-CM | POA: Diagnosis not present

## 2018-08-08 DIAGNOSIS — L89151 Pressure ulcer of sacral region, stage 1: Secondary | ICD-10-CM | POA: Diagnosis not present

## 2018-08-08 MED ORDER — SENNOSIDES-DOCUSATE SODIUM 8.6-50 MG PO TABS
1.0000 | ORAL_TABLET | Freq: Two times a day (BID) | ORAL | Status: DC
  Administered 2018-08-08: 1 via ORAL
  Filled 2018-08-08 (×2): qty 1

## 2018-08-08 MED ORDER — POLYETHYLENE GLYCOL 3350 17 G PO PACK
17.0000 g | PACK | Freq: Every day | ORAL | Status: DC
  Administered 2018-08-08: 17 g via ORAL
  Filled 2018-08-08 (×2): qty 1

## 2018-08-08 MED ORDER — SORBITOL 70 % SOLN
30.0000 mL | Freq: Once | Status: AC
  Administered 2018-08-08: 30 mL via ORAL
  Filled 2018-08-08: qty 30

## 2018-08-08 NOTE — Progress Notes (Signed)
PROGRESS NOTE    Carl [name]  DDU:[id number] DOB: [birth year] DOA: 08/04/2018 PCP: [name], MD   Brief Narrative:  Per Dr. [name] is a 83 y.o. male with medical history significant of CAD, HTN, prostate Ca, rad seed implant, A.Fib on eliquis.  Patient presented to the ED for evaluation of hematuria.  Has been having hematuria, [facility].  Started on empiric cefuroxime on 07/29/18 and UCx obtained at that time.  Since then symptoms have persisted including dysuria, 6/10 pelvic and back pain, gross hematuria.  Presented to ED for ongoing symtpoms.   ED Course: On discussion with urologist, turns out the culture grew out Pseudomonas (which explains why cefuroxime didn't work).  P. Aureg was resistant to Cipro, intermediate to Levoquin, sensitive to: amacasin, azotranam, ceftazadine. Cefepime, gent, mero, zosyn and tobra.  Patient allergic to cipro anyhow.   Assessment & Plan:   Principal Problem:   Pseudomonas urinary tract infection Active Problems:   Persistent atrial fibrillation   Urolithiasis   Hematuria   Pressure injury of skin   Pseudomonas aeruginosa infection   Acute cystitis with hematuria  1 Pseudomonas UTI with hematuria Patient noted to have a Pseudomonas UTI and presented with hematuria.  Cultures sensitive to [employer].  Patient started on IV Fortaz.  Renal ultrasound limited however negative for hydronephrosis.  Left kidney not well evaluated.  Renal stones on prior CT not well visualized sonographically.  Right renal cyst.  Cyst in the left kidney on CT not well seen.  Patient currently afebrile.  Patient allergic to oral ciprofloxacin and as such needing IV antibiotics.  Continue IV Fortaz day 4/5-7.  Patient frail, cachectic with weakness.  Patient also with complaints of suprapubic lower abdominal pain and possibly bladder spasms.  Some improvement with suprapubic lower abdominal pain and bladder spasms with B and  O suppositories.  Continue Pyridium.  Outpatient follow-up with urology.    2.  Hematuria Likely secondary to problem #1 in the setting of anticoagulation with Eliquis.  Hematuria clearing up.  Hemoglobin stable at 11.6.  3.  Persistent atrial fibrillation Continue amiodarone for rate control.  Anticoagulation of Eliquis on hold as patient had presented with gross hematuria.  Outpatient follow-up.  4.  Pressure injury of the skin/stage I pressure injury sacrum Continue current wound care.  5.  Low back pain Patient with complaints of low back pain likely musculoskeletal pain in nature.  Continue scheduled Tylenol 500 mg 3 times daily.  Oxycodone as needed for breakthrough pain.  Heat compresses.    DVT prophylaxis: SCDs Code Status: Full Family Communication: Updated patient.  No family at bedside. Disposition Plan: Home once patient has completed course of IV antibiotics.  Patient allergic to ciprofloxacin and as such unable to be treated with oral antibiotics.    Consultants:   None  Procedures:   Renal ultrasound 08/05/2018  Antimicrobials:  IV Tressie Ellis 08/05/2018   Subjective: Patient complaining of itching on his back and requesting lotion be placed on his back.  States lower abdominal pain improved with BNO suppositories.  No chest pain.  No shortness of breath.  Dysuria improving.    Objective: Vitals:   08/06/18 2224 08/07/18 0420 08/07/18 1619 08/07/18 2120  BP: 140/76 (!) 144/81 137/62 (!) 103/57  Pulse: 79 82 66 62  Resp: 16 15  16   Temp: 98.4 F (36.9 C) 100.2 F (37.9 C)  99.3 F (37.4 C)  TempSrc: Oral Oral    SpO2:  98% 94% 100% 99%  Weight:      Height:        Intake/Output Summary (Last 24 hours) at 08/08/2018 1206 Last data filed at 08/08/2018 0520 Gross per 24 hour  Intake 865.17 ml  Output 2100 ml  Net -1234.83 ml   Filed Weights   08/04/18 1952 08/06/18 0640  Weight: 74.4 kg 77.6 kg    Examination:  General exam: Chronically  ill-appearing.  Frail.  Cachectic. Respiratory system: Lungs clear to auscultation bilaterally.  No wheezes, no crackles, no rhonchi.  Normal respiratory effort. Cardiovascular system: Regular rate rhythm no murmurs rubs or gallops.  No JVD.  No lower extremity edema.  Gastrointestinal system: Abdomen is soft, nondistended, decreasing tenderness to palpation in the suprapubic region, positive bowel sounds.  No rebound.  No guarding.  Central nervous system: Alert and oriented. No focal neurological deficits. Extremities: Symmetric 5 x 5 power. Musculoskeletal: Lower back with less tenderness to palpation. Skin: No rashes, lesions or ulcers Psychiatry: Judgement and insight appear normal. Mood & affect appropriate.     Data Reviewed: I have personally reviewed following labs and imaging studies  CBC: Recent Labs  Lab 08/04/18 2002 08/05/18 8453 08/06/18 0648 08/07/18 0707  WBC 6.2 6.1 6.9 8.4  NEUTROABS 5.2  --  5.5  --   HGB 12.0* 11.0* 11.5* 11.6*  HCT 38.0* 35.5* 36.1* 36.2*  MCV 101.1* 102.6* 99.2 99.7  PLT 161 140* 134* 646*   Basic Metabolic Panel: Recent Labs  Lab 08/04/18 2002 08/05/18 0637 08/06/18 0648 08/07/18 0707  NA 139 139 136 135  K 4.1 3.8 3.8 3.9  CL 108 109 105 102  CO2 23 20* 24 23  GLUCOSE 111* 89 103* 103*  BUN 17 15 16 16   CREATININE 1.30* 1.08 1.22 1.08  CALCIUM 8.9 8.5* 8.5* 8.8*   GFR: Estimated Creatinine Clearance: 58.1 mL/min (by C-G formula based on SCr of 1.08 mg/dL). Liver Function Tests: No results for input(s): AST, ALT, ALKPHOS, BILITOT, PROT, ALBUMIN in the last 168 hours. No results for input(s): LIPASE, AMYLASE in the last 168 hours. No results for input(s): AMMONIA in the last 168 hours. Coagulation Profile: No results for input(s): INR, PROTIME in the last 168 hours. Cardiac Enzymes: No results for input(s): CKTOTAL, CKMB, CKMBINDEX, TROPONINI in the last 168 hours. BNP (last 3 results) No results for input(s): PROBNP in  the last 8760 hours. HbA1C: No results for input(s): HGBA1C in the last 72 hours. CBG: No results for input(s): GLUCAP in the last 168 hours. Lipid Profile: No results for input(s): CHOL, HDL, LDLCALC, TRIG, CHOLHDL, LDLDIRECT in the last 72 hours. Thyroid Function Tests: No results for input(s): TSH, T4TOTAL, FREET4, T3FREE, THYROIDAB in the last 72 hours. Anemia Panel: No results for input(s): VITAMINB12, FOLATE, FERRITIN, TIBC, IRON, RETICCTPCT in the last 72 hours. Sepsis Labs: No results for input(s): PROCALCITON, LATICACIDVEN in the last 168 hours.  Recent Results (from the past 240 hour(s))  Urine culture     Status: Abnormal   Collection Time: 08/04/18  8:02 PM  Result Value Ref Range Status   Specimen Description   Final    URINE, CLEAN CATCH Performed at [facility], [place] [address] [place], [place] [address]    Special Requests   Final    NONE Performed at [facility], [place] [address] [place], [place] [address]    Culture MULTIPLE SPECIES PRESENT, SUGGEST RECOLLECTION (A)  Final   Report Status 08/06/2018 FINAL  Final         Radiology Studies: No results found.      Scheduled Meds: . acetaminophen  500 mg Oral Q8H  . amiodarone  200 mg Oral Daily  . hydrocortisone cream   Topical QID  . pantoprazole  80 mg Oral Daily  . phenazopyridine  100 mg Oral TID WC  . rosuvastatin  40 mg Oral Daily  . tamsulosin  0.4 mg Oral QPC supper   Continuous Infusions: . sodium chloride Stopped (08/07/18 1755)  . cefTAZidime (FORTAZ)  IV 2 g (08/08/18 3825)     LOS: 1 day    Time spent: 35 minutes    Irine Seal, MD [facility]  If 7PM-7AM, please contact night-coverage [email or web address] Password [facility] 08/08/2018, 12:06 PM

## 2018-08-08 NOTE — Progress Notes (Signed)
Pharmacy Antibiotic Note  Carl [name] is a 83 y.o. male with pseudomonas UTI, presented to the ED on 08/04/2018 with c/o hematuria.    Per Dr. Alcario Drought, UCx on 07/29/18 from urology office has [name] consulted to dose ceftazidime for Pseudomonas UTI.  - 07/29/18 Urine culture from urology office: Pseudomonas aeruginosa (S= amikacin, aztreonam, ceftazidime, cefepime, gentamicin, meropenem, pip/tazo, tobra. I = levofloxacin. R = cipro  Today, 08/08/18  WBC WNL 1/11  Afebrile  SCr 1.08, CrCl ~ 58 mL/min  Plan: - Continue ceftazidime to 2 gm IV q8h (CrCl > 50 mL/min) - D4/5-7 planned - Monitor renal function  _________________________________________  Height: 5\' 11"  (180.3 cm) Weight: 171 lb 1.2 oz (77.6 kg) IBW/kg (Calculated) : 75.3  Temp (24hrs), Avg:99.3 F (37.4 C), Min:99.3 F (37.4 C), Max:99.3 F (37.4 C)  Recent Labs  Lab 08/04/18 2002 08/05/18 0637 08/06/18 0648 08/07/18 0707  WBC 6.2 6.1 6.9 8.4  CREATININE 1.30* 1.08 1.22 1.08    Estimated Creatinine Clearance: 58.1 mL/min (by C-G formula based on SCr of 1.08 mg/dL).    Allergies  Allergen Reactions  . Ciprofloxacin     Thank you for allowing pharmacy to be a part of this patient's care.  Ulice Dash D, PharmD 08/08/2018 10:30 AM

## 2018-08-09 DIAGNOSIS — I4819 Other persistent atrial fibrillation: Secondary | ICD-10-CM | POA: Diagnosis not present

## 2018-08-09 DIAGNOSIS — N390 Urinary tract infection, site not specified: Secondary | ICD-10-CM | POA: Diagnosis not present

## 2018-08-09 DIAGNOSIS — N3001 Acute cystitis with hematuria: Secondary | ICD-10-CM | POA: Diagnosis not present

## 2018-08-09 DIAGNOSIS — R310 Gross hematuria: Secondary | ICD-10-CM | POA: Diagnosis not present

## 2018-08-09 LAB — HEMOGLOBIN AND HEMATOCRIT, BLOOD
HCT: 36.9 % — ABNORMAL LOW (ref 39.0–52.0)
Hemoglobin: 11.3 g/dL — ABNORMAL LOW (ref 13.0–17.0)

## 2018-08-09 LAB — BASIC METABOLIC PANEL
Anion gap: 8 (ref 5–15)
BUN: 17 mg/dL (ref 8–23)
CO2: 25 mmol/L (ref 22–32)
Calcium: 8.8 mg/dL — ABNORMAL LOW (ref 8.9–10.3)
Chloride: 106 mmol/L (ref 98–111)
Creatinine, Ser: 1.2 mg/dL (ref 0.61–1.24)
GFR calc Af Amer: >60 mL/min (ref >60)
GFR calc non Af Amer: 57 mL/min — ABNORMAL LOW (ref >60)
Glucose, Bld: 105 mg/dL — ABNORMAL HIGH (ref 70–99)
Potassium: 4.3 mmol/L (ref 3.5–5.1)
Sodium: 139 mmol/L (ref 135–145)

## 2018-08-09 MED ORDER — HYDROCORTISONE 1 % EX CREA: TOPICAL_CREAM | Freq: Four times a day (QID) | CUTANEOUS | 0 refills | Status: DC

## 2018-08-09 MED ORDER — BELLADONNA ALKALOIDS-OPIUM 16.2-60 MG RE SUPP: 1.0000 | Freq: Four times a day (QID) | RECTAL | 0 refills | Status: DC | PRN

## 2018-08-09 MED ORDER — MUSCLE RUB 10-15 % EX CREA: 1.0000 "application " | TOPICAL_CREAM | CUTANEOUS | 0 refills | Status: DC | PRN

## 2018-08-09 MED ORDER — SENNOSIDES-DOCUSATE SODIUM 8.6-50 MG PO TABS: 1.0000 | ORAL_TABLET | Freq: Two times a day (BID) | ORAL | Status: DC

## 2018-08-09 MED ORDER — POLYETHYLENE GLYCOL 3350 17 G PO PACK: 17.0000 g | PACK | Freq: Every day | ORAL | 0 refills | Status: DC

## 2018-08-09 NOTE — Discharge Summary (Signed)
Physician Discharge Summary  Carl [name] KDT:[id number] DOB: Dec 24, 1935 DOA: 08/04/2018  PCP: Carl Haven, MD  Admit date: 08/04/2018 Discharge date: 08/09/2018  Time spent: 55 minutes  Recommendations for Outpatient Follow-up:  1. Patient will be discharged home with home health services. 2. Follow-up with Carl Slaughter, PA urology in 1 to 2 weeks.  On follow-up urology will need to determine when okay for patient to be resumed back on his anticoagulation of Eliquis. 3. Follow-up with Carl Haven, MD in 2 weeks.   Discharge Diagnoses:  Principal Problem:   Pseudomonas urinary tract infection Active Problems:   Persistent atrial fibrillation   Urolithiasis   Hematuria   Pressure injury of skin   Pseudomonas aeruginosa infection   Acute cystitis with hematuria   Discharge Condition: Stable and improved  Diet recommendation: Heart healthy  Filed Weights   08/04/18 1952 08/06/18 0640  Weight: 74.4 kg 77.6 kg    History of present illness:  Per Dr Carl [name] is a 83 y.o. male with medical history significant of CAD, HTN, prostate Ca, rad seed implant, A.Fib on eliquis.  Patient presented to the ED for evaluation of hematuria.  Had been having hematuria, [facility].  Started on empiric cefuroxime on 07/29/18 and UCx obtained at that time.  Since then symptoms have persisted including dysuria, 6/10 pelvic and back pain, gross hematuria.  Presented to ED for ongoing symtpoms.   ED Course: On discussion with urologist, turns out the culture grew out Pseudomonas (which explains why cefuroxime didn't work).  P. Aureg was resistant to Cipro, intermediate to Levoquin, sensitive to: amacasin, azotranam, ceftazadine. Cefepime, gent, mero, zosyn and tobra.  Patient allergic to cipro anyhow.  Hospital Course:  1 Pseudomonas UTI with hematuria Patient noted to have a Pseudomonas UTI and presented with hematuria.  Cultures sensitive to  [employer].  Patient started on IV Fortaz on admission.  Renal ultrasound limited however negative for hydronephrosis.  Left kidney not well evaluated.  Renal stones on prior CT not well visualized sonographically.  Right renal cyst.  Cyst in the left kidney on CT not well seen.  Patient remained afebrile.  Patient allergic to oral ciprofloxacin and as such needing IV antibiotics.    Patient maintained on IV Fortaz throughout the hospitalization and received a total of 5 days of IV antibiotics.  Patient remained afebrile with a normal white count.  Patient improved clinically.  Patient was also placed on Pyridium as well as B and O suppositories for bladder spasms.  Patient will be discharged in stable and improved condition and is to follow-up with urology in the outpatient setting.   2.  Hematuria Likely secondary to problem #1 in the setting of anticoagulation with Eliquis.  Eliquis was held throughout the hospitalization and will not be resumed on discharge until follow-up with PCP and urology.  Hematuria improved and had resolved by day of discharge.  Hemoglobin remained stable throughout the hospitalization  and hemoglobin was 11.3 by day of discharge.  Outpatient follow-up with urology.   3.  Persistent atrial fibrillation Continued on amiodarone for rate control.  Anticoagulation of Eliquis was held throughout the hospitalization as patient had presented with gross hematuria.  Patient will be discharged off of Eliquis and will defer resumption of Eliquis to PCP and urology on follow-up.    4.  Pressure injury of the skin/stage I pressure injury sacrum Wound care.   5.  Low back pain Patient with complaints  of low back pain likely musculoskeletal pain in nature.    Patient was placed on scheduled Tylenol as well as oxycodone as needed as well as heat compresses.  Outpatient follow-up with PCP.     Procedures:  Renal ultrasound 08/05/2018  Consultations:  None  Discharge Exam: Vitals:    08/09/18 0503 08/09/18 1430  BP: 106/64 120/70  Pulse: 63 70  Resp: 18 20  Temp: 98.1 F (36.7 C) 98.1 F (36.7 C)  SpO2: 96% 98%    General: NAD Cardiovascular: RRR Respiratory: CTAB  Discharge Instructions   Discharge Instructions    Diet - low sodium heart healthy   Complete by:  As directed    Increase activity slowly   Complete by:  As directed    Increase activity slowly   Complete by:  As directed      Allergies as of 08/09/2018      Reactions   Ciprofloxacin       Medication List    STOP taking these medications   apixaban 2.5 MG Tabs tablet Commonly known as:  ELIQUIS     TAKE these medications   amiodarone 200 MG tablet Commonly known as:  PACERONE Take 1 tablet (200 mg total) by mouth daily.   hydrocortisone cream 1 % Apply topically 4 (four) times daily.   MUSCLE RUB 10-15 % Crea Apply 1 application topically as needed for muscle pain (lower back pain).   omeprazole 40 MG capsule Commonly known as:  PRILOSEC Take 1 capsule (40 mg total) by mouth daily.   opium-belladonna 16.2-60 MG suppository Commonly known as:  B&O SUPPRETTES Place 1 suppository rectally every 6 (six) hours as needed for bladder spasms.   polyethylene glycol packet Commonly known as:  MIRALAX / GLYCOLAX Take 17 g by mouth daily. Start taking on:  August 10, 2018   rosuvastatin 40 MG tablet Commonly known as:  CRESTOR Take 1 tablet (40 mg total) by mouth daily.   senna-docusate 8.6-50 MG tablet Commonly known as:  Senokot-S Take 1 tablet by mouth 2 (two) times daily.   tamsulosin 0.4 MG Caps capsule Commonly known as:  FLOMAX Take 1 capsule (0.4 mg total) by mouth daily after supper.   TYLENOL 500 MG tablet Generic drug:  acetaminophen Take 1,000 mg by mouth every 6 (six) hours as needed for mild pain (for pain).      Allergies  Allergen Reactions  . Ciprofloxacin    Follow-up Information    [facility].   Specialty:  Carl [name] Why:  physical therapy and occupational therapy Contact information: [address] [place] [address] [phone]        Carl Haven, MD. Schedule an appointment as soon as possible for a visit in 2 week(s).   Specialty:  Family Medicine Contact information: [place] [address] [phone]        Jeanann Lewandowsky, [name]. Schedule an appointment as soon as possible for a visit in 1 week(s).   Specialty:  Urology Why:  f/u in 1-2 weeks. Contact information: [address] [place] [address] [phone]            The results of significant diagnostics from this hospitalization (including imaging, microbiology, ancillary and laboratory) are listed below for reference.    Significant Diagnostic Studies: US Renal  Result Date: 08/05/2018 CLINICAL DATA:  History of kidney stones. Evaluate for edema. Hematuria. EXAM: RENAL /  URINARY TRACT ULTRASOUND COMPLETE COMPARISON:  Abdominal CT 06/18/2018 FINDINGS: Right Kidney: Renal measurements: 11.4 x 5.1 x 5.2 cm = volume: 161 mL. Thinning of the renal parenchyma. No hydronephrosis. Exophytic cyst measures 4.3 cm. Stones in the right kidney on prior CT are not well visualize sonographically. Left Kidney: Renal measurements: 12.2 x 5.7 x 3.8 cm = volume: 139 mL. Left kidney is not well visualized due to rib shadowing. No hydronephrosis. Known stones on CT are not well visualized. Known cyst on CT is not well visualized. Bladder: Nondistended and not well evaluated. IMPRESSION: 1. Limited sonographic evaluation of the kidneys demonstrates no evidence of hydronephrosis. Particularly, the left kidney is not well evaluated. 2. Renal stones on prior CT are not well visualized sonographically. 3. Right renal cyst. Cysts in the left kidney on CT are not well seen. Electronically Signed   By: Keith Rake M.D.   On: 08/05/2018 00:54    Microbiology: Recent Results (from the past  240 hour(s))  Urine culture     Status: Abnormal   Collection Time: 08/04/18  8:02 PM  Result Value Ref Range Status   Specimen Description   Final    URINE, CLEAN CATCH Performed at [facility], [place] [address] [place], [place] [address]    Special Requests   Final    NONE Performed at [facility], [place] [address], [place], [place] [address]    Culture MULTIPLE SPECIES PRESENT, SUGGEST RECOLLECTION (A)  Final   Report Status 08/06/2018 FINAL  Final     Labs: Basic Metabolic Panel: Recent Labs  Lab 08/04/18 2002 08/05/18 0981 08/06/18 0648 08/07/18 0707 08/09/18 0711  NA 139 139 136 135 139  K 4.1 3.8 3.8 3.9 4.3  CL 108 109 105 102 106  CO2 23 20* 24 23 25   GLUCOSE 111* 89 103* 103* 105*  BUN 17 15 16 16 17   CREATININE 1.30* 1.08 1.22 1.08 1.20  CALCIUM 8.9 8.5* 8.5* 8.8* 8.8*   Liver Function Tests: No results for input(s): AST, ALT, ALKPHOS, BILITOT, PROT, ALBUMIN in the last 168 hours. No results for input(s): LIPASE, AMYLASE in the last 168 hours. No results for input(s): AMMONIA in the last 168 hours. CBC: Recent Labs  Lab 08/04/18 2002 08/05/18 1914 08/06/18 0648 08/07/18 0707 08/09/18 0711  WBC 6.2 6.1 6.9 8.4  --   NEUTROABS 5.2  --  5.5  --   --   HGB 12.0* 11.0* 11.5* 11.6* 11.3*  HCT 38.0* 35.5* 36.1* 36.2* 36.9*  MCV 101.1* 102.6* 99.2 99.7  --   PLT 161 140* 134* 143*  --    Cardiac Enzymes: No results for input(s): CKTOTAL, CKMB, CKMBINDEX, TROPONINI in the last 168 hours. BNP: BNP (last 3 results) No results for input(s): BNP in the last 8760 hours.  ProBNP (last 3 results) No results for input(s): PROBNP in the last 8760 hours.  CBG: No results for input(s): GLUCAP in the last 168 hours.     Signed:  Irine Seal MD.  [facility] 08/09/2018, 2:52 PM

## 2018-08-09 NOTE — Care Management Note (Signed)
Case Management Note  Patient Details  Name: Carl [name] MRN: [id number] Date of Birth: 1935-12-22  Subjective/Objective:   Patient now agreeable to [facility] services.                  Action/Plan: Contacted [facility] but they were unable to accept. Contacted [facility] for the referral, they have accepted. No DME needs.   Expected Discharge Date:                  Expected Discharge Plan:  [place]  In-House Referral:  NA  Discharge planning Services  CM Consult  Post Acute Care Choice:  Home Health Choice offered to:  Patient  DME Arranged:  N/A DME Agency:  NA  HH Arranged:  PT, OT [place] Agency:  [facility] [employer]  Status of Service:  Completed, signed off  If discussed at [employer], dates discussed:    Additional Comments:  Guadalupe Maple, RN 08/09/2018, 12:17 PM

## 2018-08-09 NOTE — Progress Notes (Signed)
Occupational Therapy Treatment Patient Details Name: Carl [name] MRN: [id number] DOB: 12/09/1935 Today's Date: 08/09/2018    History of present illness Carl [name] is a 83 y.o. male with medical history significant of CAD, HTN, prostate Ca, rad seed implant, A.Fib on eliquis..  Pt admitted to [facility] with hematuria    OT comments  Pt agreeable to OT. Pt will need A with ADL activity and states family will A  Follow Up Recommendations  Home health OT;Supervision/Assistance - 24 hour    Equipment Recommendations  3 in 1 bedside commode    Recommendations for Other Services      Precautions / Restrictions Precautions Precautions: Fall Restrictions Weight Bearing Restrictions: No       Mobility Bed Mobility Overal bed mobility: Needs Assistance Bed Mobility: Supine to Sit     Supine to sit: Min assist     General bed mobility comments: increased time  Transfers Overall transfer level: Needs assistance Equipment used: Rolling walker (2 wheeled) Transfers: Sit to/from [facility] Sit to Stand: Min assist Stand pivot transfers: Min assist       General transfer comment: increased time    Balance Overall balance assessment: Needs assistance Sitting-balance support: No upper extremity supported;Feet supported Sitting balance-Leahy Scale: Fair     Standing balance support: Single extremity supported;Bilateral upper extremity supported Standing balance-Leahy Scale: Poor Standing balance comment: reliant on at least unilateral UE support                           ADL either performed or assessed with clinical judgement   ADL Overall ADL's : Needs assistance/impaired     Grooming: Standing;Min guard               Lower Body Dressing: Moderate assistance;Sit to/from stand;Cueing for sequencing;Cueing for safety   Toilet Transfer: Minimal assistance;RW;Comfort height toilet;Stand-pivot;Cueing for  sequencing;Squat-pivot;BSC   Toileting- Clothing Manipulation and Hygiene: Minimal assistance;Sit to/from stand;Cueing for sequencing;Cueing for safety         General ADL Comments: pt states family will provide 24/7 A. Pt spends most of his day in the recliner.     Vision       Perception     Praxis      Cognition Arousal/Alertness: Awake/alert Behavior During Therapy: WFL for tasks assessed/performed Overall Cognitive Status: Within Functional Limits for tasks assessed                                                     Pertinent Vitals/ Pain       Pain Score: 3  Pain Location: back Pain Descriptors / Indicators: Discomfort;Sore Pain Intervention(s): Limited activity within patient's tolerance;Repositioned;Monitored during session         Frequency  Min 2X/week        Progress Toward Goals  OT Goals(current goals can now be found in the care plan section)  Progress towards OT goals: Progressing toward goals     Plan Discharge plan remains appropriate    Co-evaluation                 AM-PAC OT "6 Clicks" Daily Activity     Outcome Measure     Help from another person taking care of personal grooming?: A Little Help from another person toileting, which  includes using toliet, bedpan, or urinal?: A Little Help from another person bathing (including washing, rinsing, drying)?: A Little Help from another person to put on and taking off regular upper body clothing?: A Little Help from another person to put on and taking off regular lower body clothing?: A Little 6 Click Score: 15    End of Session Equipment Utilized During Treatment: Rolling walker;Gait belt  OT Visit Diagnosis: Unsteadiness on feet (R26.81);Other abnormalities of gait and mobility (R26.89);Muscle weakness (generalized) (M62.81);History of falling (Z91.81)   Activity Tolerance Patient tolerated treatment well   Patient Left in chair;with call bell/phone  within reach;with chair alarm set   Nurse Communication Mobility status        Time: 2811-8867 OT Time Calculation (min): 14 min  Charges: OT General Charges $OT Visit: 1 Visit OT Treatments $Self Care/Home Management : 8-22 mins  Kari Baars, [place] [phone] Office- [phone]      [place], Edwena Felty D 08/09/2018, 11:24 AM

## 2018-08-10 ENCOUNTER — Other Ambulatory Visit: Payer: Medicare Other

## 2018-08-10 ENCOUNTER — Telehealth: Payer: Self-pay

## 2018-08-10 NOTE — Telephone Encounter (Signed)
Transition Care Management Follow-up Telephone Call   Date discharged? 08/09/18. Information received from daughter Anette (HIPAA compliant).   How have you been since you were released from the hospital? Resting well. Taking Tylenol for pain. Trying to drink more water and exercise as directed.    Do you understand why you were in the hospital? Yes.   Do you understand the discharge instructions? Yes, increase activity slowly. Follow up with Urology and pcp.    Where were you discharged to? Home.    Items Reviewed:  Medications reviewed: Yes.  Daughter manages medications.  Allergies reviewed: Yes, discovered allergic to Cipro.  Dietary changes reviewed: Yes, low sodium, heart healthy.  Referrals reviewed: Yes.  Home health services, PT and OT in progress.    Functional Questionnaire:   Activities of Daily Living (ADLs):   He states they are independent in the following: Ambulating, self feeding, toileting, bathing.  Walker on standby.  States they require assistance with the following: Meal prep, dressing.   Any transportation issues/concerns?: He does not drive.  Daughter to bring.    Any patient concerns? None at this time.    Confirmed importance and date/time of follow-up visits scheduled Yes, tentatively scheduled 1/28,20 at 3:00  Provider Appointment booked with Dr. [name], pcp.  Confirmed with patient if condition begins to worsen call PCP or go to the ER.  Patient was given the office number and encouraged to call back with question or concerns.  : Yes

## 2018-08-11 ENCOUNTER — Telehealth: Payer: Self-pay | Admitting: Family Medicine

## 2018-08-11 NOTE — Telephone Encounter (Signed)
Copied from [place] [phone]. Topic: Quick Communication - Home Health Verbal Orders >> Aug 11, 2018  9:48 AM Leward Quan A wrote: Caller/Agency: Waldon Reining / [facility] Number: [phone] ok to LM Requesting OT/PT/Skilled Nursing/Social Work: PT Frequency: Twice a week for 4 weeks

## 2018-08-12 ENCOUNTER — Other Ambulatory Visit: Payer: Self-pay | Admitting: Family Medicine

## 2018-08-12 NOTE — Telephone Encounter (Signed)
LMTCB if needed, but gave verbal orders.

## 2018-08-24 ENCOUNTER — Encounter: Payer: Self-pay | Admitting: Family Medicine

## 2018-08-24 ENCOUNTER — Ambulatory Visit (INDEPENDENT_AMBULATORY_CARE_PROVIDER_SITE_OTHER): Payer: Medicare Other | Admitting: Family Medicine

## 2018-08-24 VITALS — BP 104/78 | HR 75 | Temp 98.7°F | Ht 71.0 in | Wt 161.0 lb

## 2018-08-24 DIAGNOSIS — R310 Gross hematuria: Secondary | ICD-10-CM

## 2018-08-24 DIAGNOSIS — N390 Urinary tract infection, site not specified: Secondary | ICD-10-CM | POA: Diagnosis not present

## 2018-08-24 DIAGNOSIS — I4819 Other persistent atrial fibrillation: Secondary | ICD-10-CM | POA: Diagnosis not present

## 2018-08-24 DIAGNOSIS — B965 Pseudomonas (aeruginosa) (mallei) (pseudomallei) as the cause of diseases classified elsewhere: Secondary | ICD-10-CM

## 2018-08-24 DIAGNOSIS — I714 Abdominal aortic aneurysm, without rupture, unspecified: Secondary | ICD-10-CM

## 2018-08-24 DIAGNOSIS — L89151 Pressure ulcer of sacral region, stage 1: Secondary | ICD-10-CM

## 2018-08-24 NOTE — Patient Instructions (Addendum)
Nice to see you. Please try to increase high calorie food intake.  You can drink Ensure 1-2 times a day between meals. We will check lab work today and contact you with the results. We will touch base with our clinical pharmacist regarding the Eliquis. You also need to see GI for follow-up as well.

## 2018-08-24 NOTE — Progress Notes (Signed)
[name], MD Phone: [phone]  Carl [name] is a 83 y.o. male who presents today for f/u.   Problems discussed: Pseudomonas UTI, A. fib, hematuria  Patient was hospitalized from 08/04/2018-08/09/2018 for Pseudomonas UTI and hematuria.  He had been seen by his urologist and was started on empiric cefuroxime.  Urine culture revealed Pseudomonas and he presented to the ED after having persistent symptoms with dysuria, pelvic pain, and back pain associated with gross hematuria.  He was started on Fortaz.  He reports his symptoms have improved.  He has had no recurrent hematuria.  He does continue to have some back pain and pelvic discomfort.  He underwent renal ultrasound that was limited though negative for hydronephrosis.  He had his Eliquis held due to the significant gross hematuria.  Hemoglobin was stable.  He has not resumed this yet.  He followed up with urology recently and they plan to have surgery completed for his kidney stones.  He was supposed to have home health though was not set up with this.  On prior CT imaging he was noted to have a AAA which ultrasound at 3 years was recommended for follow-up.  He notes he is been eating okay though has had some decreased appetite.  He has been using B&O suppositories for his bladder spasms.  Medications reviewed.  Discharge summary reviewed.  Social History   Tobacco Use  Smoking Status Former Smoker  . Years: 2.00  . Types: Cigarettes  . Last attempt to quit: 07/28/1960  . Years since quitting: 58.1  Smokeless Tobacco Never Used     ROS see history of present illness  Objective  Physical Exam Vitals:   08/24/18 1515  BP: 104/78  Pulse: 75  Temp: 98.7 F (37.1 C)  SpO2: 98%    BP Readings from Last 3 Encounters:  08/24/18 104/78  08/09/18 120/70  07/12/18 120/64   Wt Readings from Last 3 Encounters:  08/24/18 161 lb (73 kg)  08/06/18 171 lb 1.2 oz (77.6 kg)  07/12/18 163 lb 12 oz (74.3 kg)    Physical  Exam Constitutional:      General: He is not in acute distress.    Appearance: He is not diaphoretic.  Cardiovascular:     Rate and Rhythm: Normal rate and regular rhythm.     Heart sounds: Normal heart sounds.  Pulmonary:     Effort: Pulmonary effort is normal.     Breath sounds: Normal breath sounds.  Abdominal:     General: Bowel sounds are normal. There is no distension.     Palpations: Abdomen is soft.     Tenderness: There is abdominal tenderness (Mild lower abdominal). There is no guarding or rebound.  Musculoskeletal:     Comments: No midline spine tenderness, no midline spine step-off, no muscular back tenderness, no CVA tenderness  Skin:    General: Skin is warm and dry.     No ulcer on buttocks or sacrum, though mild roughness noted at top left of gluteal cleft Neurological:     Mental Status: He is alert.     Assessment/Plan: Please see individual problem list.  Pseudomonas urinary tract infection Patient has done well following treatment in the hospital.  He has had mild persistent lower back pain which could be related to arthritis versus kidney stones.  No significant other symptoms.  He has followed up with urology and they plan to do surgery for his kidney stones.  Given his hematuria he has remained off of  Eliquis.  We will recheck his blood counts and if they have improved or normalized we will message his urologist to determine if they think he is okay to go back on his Eliquis leading into his surgery.  AAA (abdominal aortic aneurysm) ([place]) Noted on prior imaging.  Recommendation was ultrasound in 3 years.  Discussed return precautions.  Persistent atrial fibrillation Appears to be sinus rhythm today.  We will determine whether or not he can resume his Eliquis.  Pressure injury of skin Possible mild pressure injury.  Discussed offloading this.  He will monitor.  Message sent to Dr Gloriann Loan and Dr Rockey Situ regarding resumption of eliquis.   Orders Placed This  Encounter  Procedures  . Basic Metabolic Panel (BMET)  . CBC    No orders of the defined types were placed in this encounter.    [name], MD [place]

## 2018-08-25 ENCOUNTER — Other Ambulatory Visit: Payer: Self-pay | Admitting: Urology

## 2018-08-25 LAB — BASIC METABOLIC PANEL
BUN: 21 mg/dL (ref 6–23)
CO2: 23 mEq/L (ref 19–32)
Calcium: 9.3 mg/dL (ref 8.4–10.5)
Chloride: 105 mEq/L (ref 96–112)
Creatinine, Ser: 1.18 mg/dL (ref 0.40–1.50)
GFR: 59.27 mL/min — ABNORMAL LOW (ref >60.00)
Glucose, Bld: 112 mg/dL — ABNORMAL HIGH (ref 70–99)
Potassium: 4 mEq/L (ref 3.5–5.1)
Sodium: 137 mEq/L (ref 135–145)

## 2018-08-25 LAB — CBC
HCT: 38 % — ABNORMAL LOW (ref 39.0–52.0)
Hemoglobin: 12.7 g/dL — ABNORMAL LOW (ref 13.0–17.0)
MCHC: 33.4 g/dL (ref 30.0–36.0)
MCV: 97.1 fl (ref 78.0–100.0)
Platelets: 198 10*3/uL (ref 150.0–400.0)
RBC: 3.92 Mil/uL — ABNORMAL LOW (ref 4.22–5.81)
RDW: 15.1 % (ref 11.5–15.5)
WBC: 6.2 10*3/uL (ref 4.0–10.5)

## 2018-08-26 ENCOUNTER — Telehealth: Payer: Self-pay | Admitting: Family Medicine

## 2018-08-26 DIAGNOSIS — I714 Abdominal aortic aneurysm, without rupture, unspecified: Secondary | ICD-10-CM | POA: Insufficient documentation

## 2018-08-26 NOTE — Assessment & Plan Note (Signed)
Appears to be sinus rhythm today.  We will determine whether or not he can resume his Eliquis.

## 2018-08-26 NOTE — Assessment & Plan Note (Signed)
Noted on prior imaging.  Recommendation was ultrasound in 3 years.  Discussed return precautions.

## 2018-08-26 NOTE — Telephone Encounter (Signed)
I received a message from her urologist that noted it would be ok for him to be on aspirin 81 mg daily. His cardiologist suggested this and the urologist noted this was acceptable.

## 2018-08-26 NOTE — Telephone Encounter (Signed)
Please let the patient or his daughter know that I received a message back from the patient's urologist.  They would prefer to have him remain off of his Eliquis until after his surgery.  We will determine when he can restart it after he has his surgery next week through urology.

## 2018-08-26 NOTE — Assessment & Plan Note (Signed)
Possible mild pressure injury.  Discussed offloading this.  He will monitor.

## 2018-08-26 NOTE — Assessment & Plan Note (Signed)
Patient has done well following treatment in the hospital.  He has had mild persistent lower back pain which could be related to arthritis versus kidney stones.  No significant other symptoms.  He has followed up with urology and they plan to do surgery for his kidney stones.  Given his hematuria he has remained off of Eliquis.  We will recheck his blood counts and if they have improved or normalized we will message his urologist to determine if they think he is okay to go back on his Eliquis leading into his surgery.

## 2018-08-26 NOTE — Telephone Encounter (Signed)
Called and spoke with patient's daughter she has been advised. However, she stated that she was told for her father NOT to be on any blood thinners before his surgery.   Sent to PCP

## 2018-08-26 NOTE — Telephone Encounter (Signed)
Please also let the patients daughter know that I heard back from his cardiologist and they recommended that he be on an 81 mg aspirin given his history of a CABG. They can start on this over the counter.

## 2018-08-27 IMAGING — PT NM PET NOPR SKULL BASE TO THIGH
8 series · 25 of 25 positions shown · non-contrast
Comparison: CT abdomen pelvis 01/05/2018, bone scan 06/11/2016

CLINICAL DATA: Prostate carcinoma with biochemical recurrence. PSA
equal 4.96 on 01/13/2018.

EXAM:
NUCLEAR MEDICINE PET SKULL BASE TO THIGH
TECHNIQUE: 10.0 mCi F-18 Fluciclovine was injected intravenously. Full-ring PET
imaging was performed from the skull base to thigh after the
radiotracer. CT data was obtained and used for attenuation
correction and anatomic localization.

[Series 4: pet sk_thigh ac · axial · 5.0mm · 4.07mm/px · z∈[+466,+1374]mm · 5 of 228 slices shown]
[im 1/228]
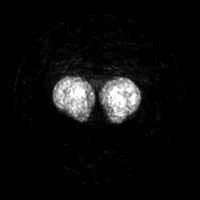
[im 57/228]
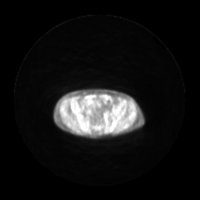
[im 114/228]
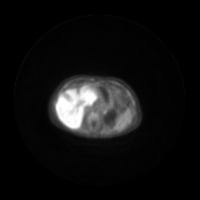
[im 171/228]
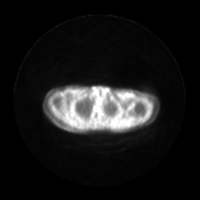
[im 228/228]
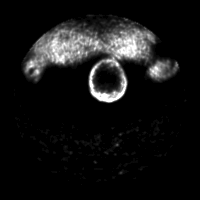

[Series 5: ct sk_thigh 5.0 b31f · axial · 5.0mm · 0.98mm/px · z∈[+466,+1374]mm · 5 of 228 slices shown]
[im 1/228]
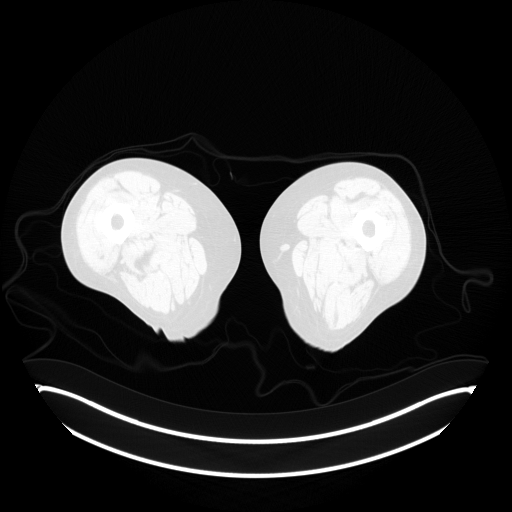
[im 57/228]
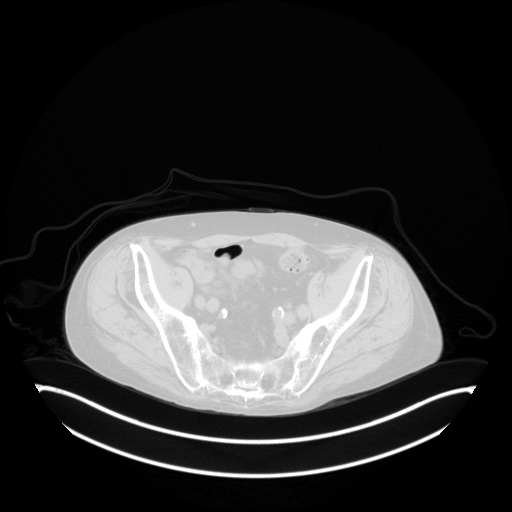
[im 114/228]
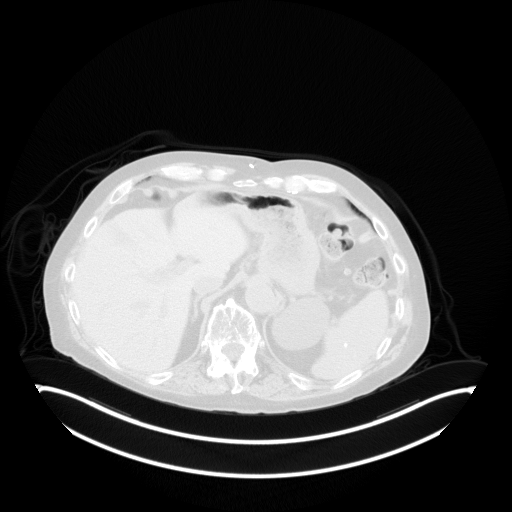
[im 171/228]
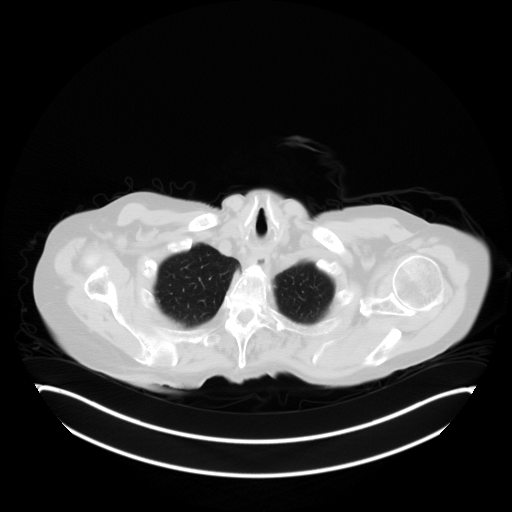
[im 228/228]
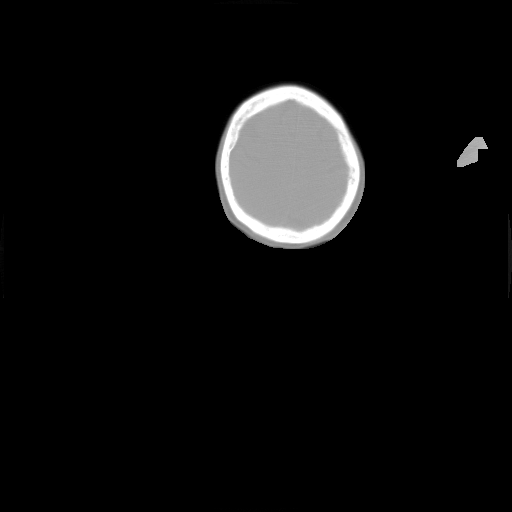

[Series 6: pet sk_thigh nac · axial · 5.0mm · 4.07mm/px · z∈[+466,+1374]mm · 5 of 228 slices shown]
[im 1/228]
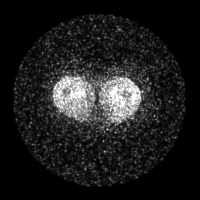
[im 57/228]
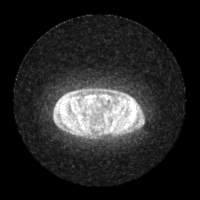
[im 114/228]
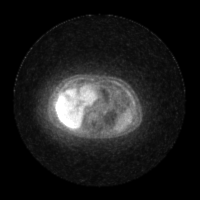
[im 171/228]
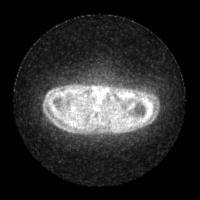
[im 228/228]
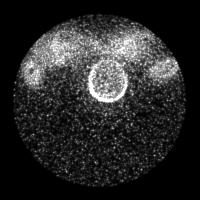

[Series 9: ct sk_thigh 5.0 (id) lung_bone · axial · 5.0mm · 0.63mm/px · 1 of 58 slices shown]
[im 1/58  bone]
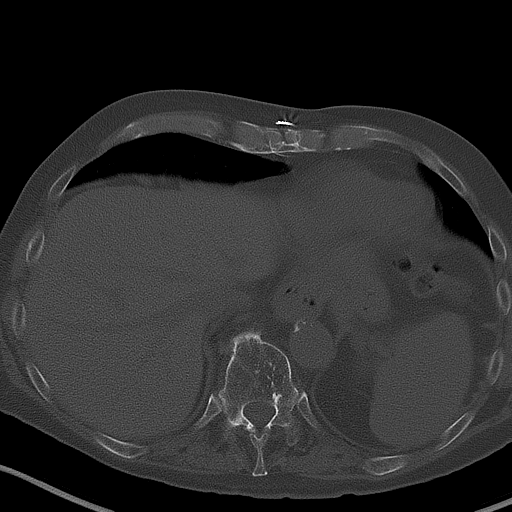

[Series 603: mip range 3 · coronal · 1.88mm/px · 1 of 32 slices shown]
[im 1/32]
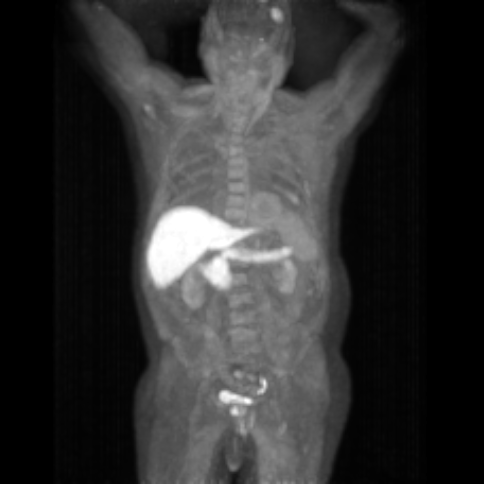

[Series 604: range-ct sk_thigh 5.0 (id)<alpha range> · 2 of 71 slices shown (1 of 2)]
[im 1/71]
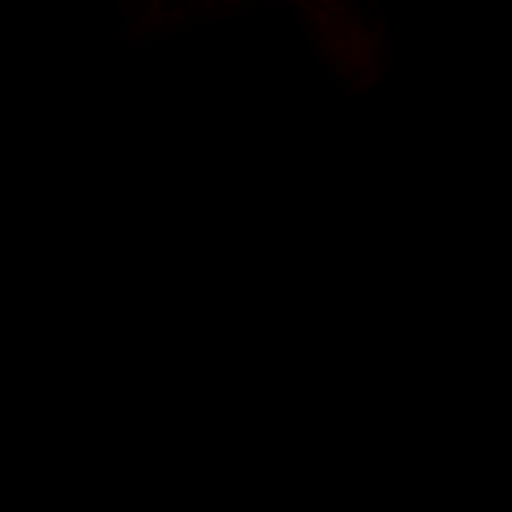
[im 71/71]
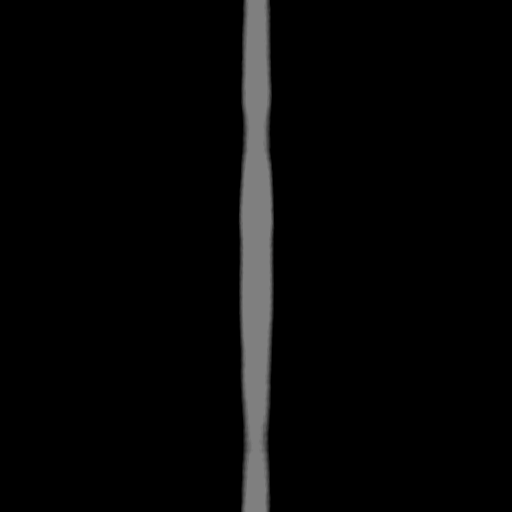

[Series 605: range-ct sk_thigh 5.0 (id)<alpha range> · 5 of 207 slices shown (2 of 2)]
[im 1/207]
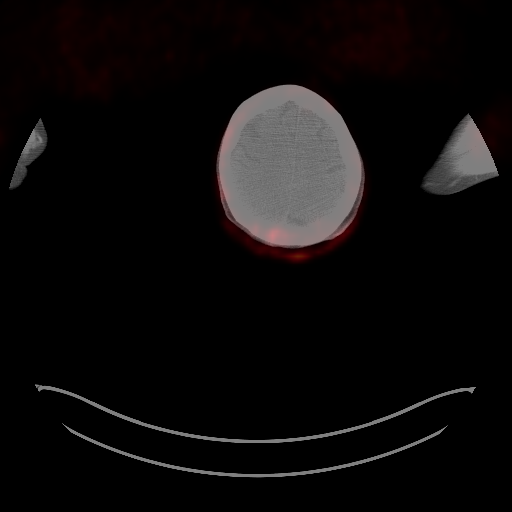
[im 52/207]
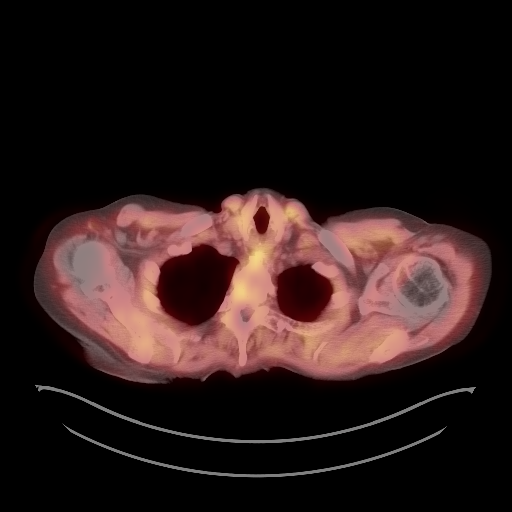
[im 104/207]
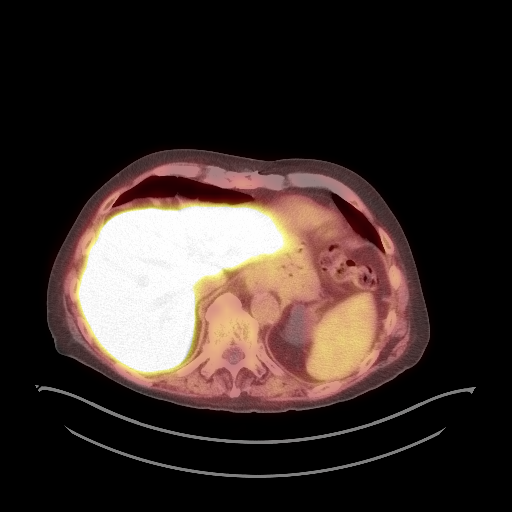
[im 155/207]
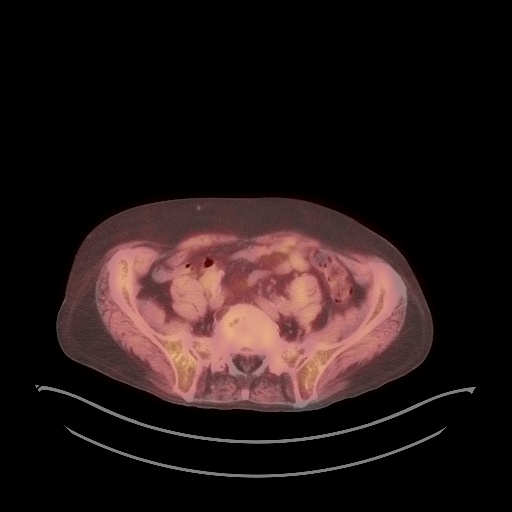
[im 207/207]
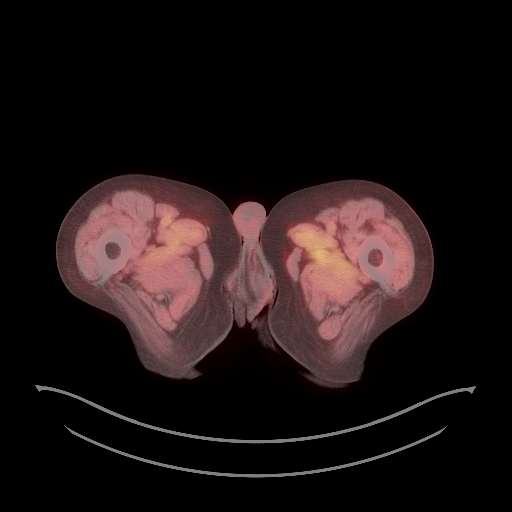

[Series 1075: results mm oncology reading · 1.0mm · 0.89mm/px · 1 of 3 slices shown]
[im 1/3]
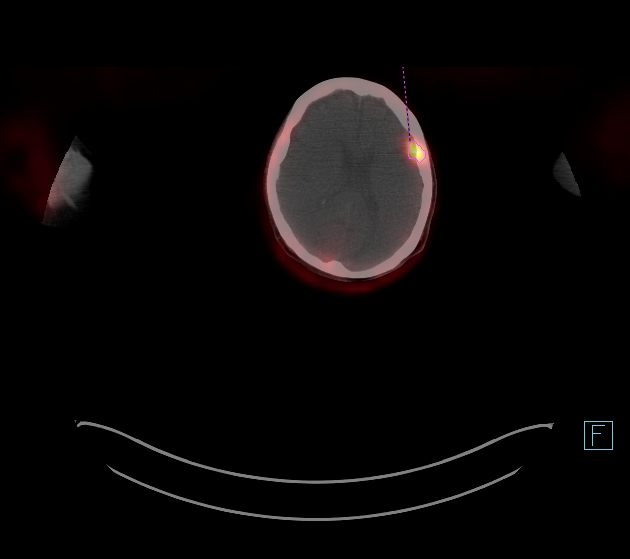

[25 of 25 positions shown; findings below may reference images not displayed]

FINDINGS: NECK

There is focal activity in the LEFT cranium which localizes to
extra-axial lesion measuring 23 x 8 mm (image [DATE]). Activity is
intense with SUV max equal 4.9. A second lesion localizing to the
CPA angle on the LEFT is less well appreciated on CT portion exam
with SUV max equal 2.9.

Incidental CT finding: None

CHEST

No radiotracer accumulation within mediastinal or hilar lymph nodes.
No suspicious pulmonary nodules on the CT scan.

Incidental CT finding: None

ABDOMEN/PELVIS

Prostate: Brachytherapy seeds in the prostate bed. The prostate
gland is small. There is asymmetric activity in the posterior RIGHT
aspect of prostate gland with SUV max equal 7.4.

Lymph nodes: No abnormal radiotracer accumulation within pelvic or
abdominal nodes. 8 mm LEFT common iliac lymph node (image 151/5)
without radiotracer accumulation.

Liver: No evidence of liver metastasis

Incidental CT finding: Complex cystic and calcified lesion in the
upper pole of the LEFT kidney is unchanged. RIGHT renal calculi also
again noted.

SKELETON

No focal  activity to suggest skeletal metastasis.
IMPRESSION: 1. Asymmetric activity in the posterior RIGHT aspect of the prostate
gland could represent local recurrence.
2. No evidence of nodal metastasis in the abdomen or pelvis.
3. No evidence of distant soft tissue metastasis or skeletal
metastasis.
4. Focal uptake within an extra-axial lesion adjacent to the LEFT
frontal lobe as well as a LEFT CPA angle lesion not well identified
on CT. Favor lesions to represent benign meningiomas which can
accumulate fluciclovine. Consider brain MRI with contrast for
confirmation.

## 2018-08-27 NOTE — Telephone Encounter (Signed)
Patient daughter notified and voiced understanding. 

## 2018-08-30 ENCOUNTER — Ambulatory Visit: Payer: Medicare Other | Admitting: Family Medicine

## 2018-09-01 ENCOUNTER — Other Ambulatory Visit: Payer: Medicare Other

## 2018-09-01 ENCOUNTER — Encounter (HOSPITAL_COMMUNITY): Payer: Self-pay

## 2018-09-01 NOTE — Progress Notes (Signed)
EKG dated 07-22-2018 in epic.  Cardiology, dr Rockey Situ, lov note in epic dated 07-22-2018.  ECHO in epic dated 09-16-2016.  Chest CT in epic dated 06-18-2018.  CBC and BMP results in epic dated 08-24-2018.

## 2018-09-01 NOTE — Patient Instructions (Signed)
Carl [name]  March 19, 1936     Your procedure is scheduled on:  09-03-2018   Report to [facility] Main  Entrance, Report to admitting at  10:30 AM    Call this number if you have problems the morning of surgery [phone]      Remember: Do not eat food or drink liquids :After Midnight. This includes candy, gum, mints.                                       BRUSH YOUR TEETH MORNING OF SURGERY AND RINSE YOUR MOUTH OUT, NO CHEWING GUM CANDY OR MINTS.       Take these medicines the morning of surgery with A SIP OF WATER:  Amiodarone,  Omeprazole,  Rosuvastatin                                  You may not have any metal on your body including hair pins and               piercings  Do not wear jewelry, make-up, lotions, powders or perfumes, deodorant                          Men may shave face and neck.      Do not bring valuables to the hospital. [name]  Contacts, dentures or bridgework may not be worn into surgery.  Leave suitcase in the car. After surgery it may be brought to your room.      Patients discharged the day of surgery will not be allowed to drive home. IF YOU ARE HAVING SURGERY AND GOING HOME THE SAME DAY, YOU MUST HAVE AN ADULT TO DRIVE YOU HOME AND BE WITH YOU FOR 24 HOURS. YOU MAY GO HOME BY TAXI OR UBER OR ORTHERWISE, BUT AN ADULT MUST ACCOMPANY YOU HOME AND STAY WITH YOU FOR 24 HOURS.    Name and phone number of your driver:             _____________________________________________________________________             [facility] - Preparing for Surgery Before surgery, you can play an important role.  Because skin is not sterile, your skin needs to be as free of germs as possible.  You can reduce the number of germs on your skin by washing with CHG (chlorahexidine gluconate) soap before surgery.  CHG is an antiseptic cleaner which kills germs and bonds with the skin  to continue killing germs even after washing. Please DO NOT use if you have an allergy to CHG or antibacterial soaps.  If your skin becomes reddened/irritated stop using the CHG and inform your nurse when you arrive at Short Stay. Do not shave (including legs and underarms) for at least 48 hours prior to the first CHG shower.  You may shave your face/neck. Please follow these instructions carefully:  1.  Shower with CHG Soap the night before surgery and the  morning of Surgery.  2.  If you choose to wash your hair, wash your hair first as usual with your  normal  shampoo.  3.  After you shampoo, rinse your hair and body thoroughly to remove the  shampoo.                          4.  Use CHG as you would any other liquid soap.  You can apply chg directly  to the skin and wash                       Gently with a scrungie or clean washcloth.  5.  Apply the CHG Soap to your body ONLY FROM THE NECK DOWN.   Do not use on face/ open                           Wound or open sores. Avoid contact with eyes, ears mouth and genitals (private parts).                       Wash face,  Genitals (private parts) with your normal soap.             6.  Wash thoroughly, paying special attention to the area where your surgery  will be performed.  7.  Thoroughly rinse your body with warm water from the neck down.  8.  DO NOT shower/wash with your normal soap after using and rinsing off  the CHG Soap.             9.  Pat yourself dry with a clean towel.            10.  Wear clean pajamas.            11.  Place clean sheets on your bed the night of your first shower and do not  sleep with pets. Day of Surgery : Do not apply any lotions/deodorants the morning of surgery.  Please wear clean clothes to the hospital/surgery center.  FAILURE TO FOLLOW THESE INSTRUCTIONS MAY RESULT IN THE CANCELLATION OF YOUR SURGERY PATIENT SIGNATURE_________________________________  NURSE  SIGNATURE__________________________________  ________________________________________________________________________

## 2018-09-02 ENCOUNTER — Telehealth: Payer: Self-pay | Admitting: Cardiovascular Disease

## 2018-09-02 ENCOUNTER — Encounter (HOSPITAL_COMMUNITY): Payer: Self-pay

## 2018-09-02 ENCOUNTER — Encounter (HOSPITAL_COMMUNITY)
Admission: RE | Admit: 2018-09-02 | Discharge: 2018-09-02 | Disposition: A | Discharge status: Home / Self Care | Payer: Medicare Other | Source: Ambulatory Visit | Attending: Urology | Admitting: Urology

## 2018-09-02 ENCOUNTER — Other Ambulatory Visit: Payer: Self-pay

## 2018-09-02 DIAGNOSIS — Z7951 Long term (current) use of inhaled steroids: Secondary | ICD-10-CM | POA: Diagnosis not present

## 2018-09-02 DIAGNOSIS — N210 Calculus in bladder: Secondary | ICD-10-CM | POA: Diagnosis not present

## 2018-09-02 DIAGNOSIS — C61 Malignant neoplasm of prostate: Secondary | ICD-10-CM | POA: Diagnosis not present

## 2018-09-02 DIAGNOSIS — N201 Calculus of ureter: Secondary | ICD-10-CM | POA: Diagnosis not present

## 2018-09-02 DIAGNOSIS — I10 Essential (primary) hypertension: Secondary | ICD-10-CM | POA: Diagnosis not present

## 2018-09-02 DIAGNOSIS — E785 Hyperlipidemia, unspecified: Secondary | ICD-10-CM | POA: Diagnosis not present

## 2018-09-02 DIAGNOSIS — E7800 Pure hypercholesterolemia, unspecified: Secondary | ICD-10-CM | POA: Diagnosis not present

## 2018-09-02 DIAGNOSIS — Z01812 Encounter for preprocedural laboratory examination: Secondary | ICD-10-CM

## 2018-09-02 DIAGNOSIS — K219 Gastro-esophageal reflux disease without esophagitis: Secondary | ICD-10-CM | POA: Diagnosis not present

## 2018-09-02 DIAGNOSIS — I2510 Atherosclerotic heart disease of native coronary artery without angina pectoris: Secondary | ICD-10-CM | POA: Diagnosis not present

## 2018-09-02 DIAGNOSIS — Z87891 Personal history of nicotine dependence: Secondary | ICD-10-CM | POA: Diagnosis not present

## 2018-09-02 DIAGNOSIS — Z79899 Other long term (current) drug therapy: Secondary | ICD-10-CM | POA: Diagnosis not present

## 2018-09-02 DIAGNOSIS — Z951 Presence of aortocoronary bypass graft: Secondary | ICD-10-CM | POA: Diagnosis not present

## 2018-09-02 HISTORY — DX: Pulmonary hypertension, unspecified: I27.20

## 2018-09-02 HISTORY — DX: Calculus of ureter: N20.1

## 2018-09-02 HISTORY — DX: Abdominal aortic aneurysm, without rupture: I71.4

## 2018-09-02 HISTORY — DX: Malignant neoplasm of prostate: C61

## 2018-09-02 HISTORY — DX: Dyspnea, unspecified: R06.00

## 2018-09-02 HISTORY — DX: Anemia, unspecified: D64.9

## 2018-09-02 HISTORY — DX: Personal history of other diseases of urinary system: Z87.448

## 2018-09-02 HISTORY — DX: Other persistent atrial fibrillation: I48.19

## 2018-09-02 HISTORY — DX: Personal history of urinary (tract) infections: Z87.440

## 2018-09-02 HISTORY — DX: Hematuria, unspecified: R31.9

## 2018-09-02 HISTORY — DX: Calculus of kidney: N20.0

## 2018-09-02 HISTORY — DX: Unspecified diastolic (congestive) heart failure: I50.30

## 2018-09-02 HISTORY — DX: Other forms of dyspnea: R06.09

## 2018-09-02 NOTE — Anesthesia Preprocedure Evaluation (Addendum)
Anesthesia Evaluation  Patient identified by MRN, date of birth, ID band Patient awake    Reviewed: Allergy & Precautions, NPO status , Patient's Chart, lab work & pertinent test results  Airway Mallampati: I  TM Distance: >3 FB Neck ROM: Full    Dental  (+) Missing,    Pulmonary former smoker,    Pulmonary exam normal        Cardiovascular hypertension, + CAD, + CABG and +CHF   Rhythm:Regular Rate:Normal     Neuro/Psych Anxiety    GI/Hepatic Neg liver ROS, GERD  Medicated,  Endo/Other    Renal/GU      Musculoskeletal  (+) Arthritis ,   Abdominal Normal abdominal exam  (+)   Peds  Hematology   Anesthesia Other Findings - HLD - Pulm HTN  Reproductive/Obstetrics                           Lab Results  Component Value Date   WBC 6.2 08/24/2018   HGB 12.7 (L) 08/24/2018   HCT 38.0 (L) 08/24/2018   MCV 97.1 08/24/2018   PLT 198.0 08/24/2018   Lab Results  Component Value Date   CREATININE 1.18 08/24/2018   BUN 21 08/24/2018   NA 137 08/24/2018   K 4.0 08/24/2018   CL 105 08/24/2018   CO2 23 08/24/2018   No results found for: INR, PROTIME  EKG: normal sinus rhythm, 1st degree AV block.  Echo: - Left ventricle: The cavity size was normal. Wall thickness was   increased in a pattern of moderate LVH. Systolic function was   normal. The estimated ejection fraction was in the range of 50%   to 55%. The study is not technically sufficient to allow   evaluation of LV diastolic function. - Ventricular septum: Septal motion showed paradox. - Mitral valve: There was moderate regurgitation. - Left atrium: The appendage was severely dilated. - Right ventricle: The cavity size was moderately dilated. - Right atrium: The atrium was moderately dilated. - Atrial septum: No defect or patent foramen ovale was identified. - Tricuspid valve: There was moderate regurgitation. - Pulmonary  arteries: PA peak pressure: 62 mm Hg (S).  Anesthesia Physical Anesthesia Plan  ASA: III  Anesthesia Plan: General   Post-op Pain Management:    Induction: Intravenous  PONV Risk Score and Plan: 3 and Ondansetron, Dexamethasone and Treatment may vary due to age or medical condition  Airway Management Planned: LMA  Additional Equipment: None  Intra-op Plan:   Post-operative Plan: Extubation in OR  Informed Consent: I have reviewed the patients History and Physical, chart, labs and discussed the procedure including the risks, benefits and alternatives for the proposed anesthesia with the patient or authorized representative who has indicated his/her understanding and acceptance.     Dental advisory given  Plan Discussed with: CRNA  Anesthesia Plan Comments: (See PST note 09/02/18, Konrad Felix, PA-C)       Anesthesia Quick Evaluation

## 2018-09-02 NOTE — Telephone Encounter (Signed)
° °  [facility] Pre-operative Risk Assessment    Request for surgical clearance:  1. What type of surgery is being performed? LEFT URETEROSCOPY/HOLMIUM LASER/STENT PLACEMENT (Left ) CYSTOSCOPY WITH LITHOLAPAXY (N/A )   2. When is this surgery scheduled? 09/03/18  3. What type of clearance is required (medical clearance vs. Pharmacy clearance to hold med vs. Both)? Medical   4. Are there any medications that need to be held prior to surgery and how long? rec's not needed at this time   5. Practice name and name of physician performing surgery? [facility], Janett Billow NP Dr. Gloriann Loan doing surgery   6. What is your office phone number [phone]   7.   What is your office fax number not given   8.   Anesthesia type (None, local, MAC, general) ? General   PER PROVIDER PLEASE CALL ANESTHESIA ASAP to discuss noted need to have an echo    Clarisse Gouge 09/02/2018, 2:09 PM  _________________________________________________________________   (provider comments below)

## 2018-09-02 NOTE — Progress Notes (Signed)
Anesthesia Chart Review   Case:  [id number] Date/Time:  09/03/18 1215   Procedures:      LEFT URETEROSCOPY/HOLMIUM LASER/STENT PLACEMENT (Left )     CYSTOSCOPY WITH LITHOLAPAXY (N/A )   Anesthesia type:  General   Pre-op diagnosis:  LEFT URETERAL CALCULI, BLADDER STONE   Location:  [place] [facility]   Surgeon:  Lucas Mallow, MD      DISCUSSION: 83 yo former smoker (quit 07/28/60) with h/o HTN, GERD, CAD (h/o NSTEMI, s/p CABG x 5 2006), A-fib/flutter (Eliquis, s/p DCCV 4/18), moderate MR, anemia, anxiety, HLD, CKD, AAA (CT abdomen 06/18/18 3.1cm), prostate cancer, left ureteral calculi scheduled for above surgery 09/03/18 with Dr. Link Snuffer.    Pt with recent hospital admission 1/8-1/13/20 due to UTI.  Eliquis held throughout hospitalization due to hematuria.  He has remained off Eliquis until upcoming surgery.  PCP, Dr. [name], will determine when to restart after surgery per office visit note 08/26/2018.  Per Dr. [name] cardiology has recommended pt stay on 81mg  ASA, pt made aware.    AAA 3.1cm on CT Abdomen 06/18/18, followed by PCP and Cardiologist with follow up ultrasound recommended in 3 years.    Last seen by cardiologist, Dr. Esmond Plants, on 07/12/18.  Stable at this visit with Echo schedule for 09/24/18 to evaluate MR and pulmonary HTN.  Stress test 105/19, Low risk stress nuclear study. No ischemia or scar, but flattened septal; will continue to follow medically.   Discussed with Dr. Ola Spurr.  Pt can you proceed with planned procedure barring acute status change.  VS: BP (!) 142/76 (BP Location: Right Arm)   Pulse 75   Temp 36.6 C (Oral)   Resp 18   Ht 5\' 11"  (1.803 m)   Wt 74.1 kg   SpO2 99%   BMI 22.79 kg/m   PROVIDERS: [name], MD is PCP last seen 08/24/2018  Esmond Plants, MD is Cardiologist  LABS: Labs reviewed: Acceptable for surgery. (all labs ordered are listed, but only abnormal results are displayed)  Labs Reviewed - No data  to display   IMAGES: CT Abdomen Pelvis 06/18/2018 IMPRESSION: 1. No specific explanation for weight loss. No evidence of metastatic disease, active infection, or proximal mesenteric stenosis. 2. Multiple bilateral renal calculi with 3 chronic stones in the distal left ureter and a new 3 mm stone is seen at the right UVJ. No hydronephrosis. 3. Cholelithiasis. 4. 3.1 cm fusiform abdominal aortic aneurysm. Recommend followup by ultrasound in 3 years. This recommendation follows ACR consensus guidelines: White Paper of the ACR Incidental Findings Committee II on Vascular Findings. Joellyn Rued Radiol 2013; [phone]  EKG: 07/12/18 Rate 78 bpm Sinus rhythm with 1st degree AV block Otherwise normal ECG  CV: Echo 09/16/16 Study Conclusions  - Left ventricle: The cavity size was normal. Wall thickness was   increased in a pattern of moderate LVH. Systolic function was   normal. The estimated ejection fraction was in the range of 50%   to 55%. The study is not technically sufficient to allow   evaluation of LV diastolic function. - Ventricular septum: Septal motion showed paradox. - Mitral valve: There was moderate regurgitation.   There was moderate regurgitation.    Peak gradient (D): 5 mm Hg. - Left atrium: The appendage was severely dilated. - Right ventricle: The cavity size was moderately dilated. - Right atrium: The atrium was moderately dilated. - Atrial septum: No defect or patent foramen ovale was identified. -  Tricuspid valve: There was moderate regurgitation. - Pulmonary arteries: PA peak pressure: 62 mm Hg (S).  Stress Test 05/01/18 Low risk stress nuclear study. No ischemia or scar, but flattened septal; will continue to follow medically.  Past Medical History:  Diagnosis Date  . AAA (abdominal aortic aneurysm) ([place])    CT abd. dated 06-18-2018 in epic,  3.1cm;  followed by pcp  . Arthritis    arms  . Bladder calculus   . Chronic anemia   . Chronic anxiety   .  CKD (chronic kidney disease), stage III ([place])   . Coronary artery disease CARDIOLOGIST-  DR Rockey Situ   a. CABG 2006 with  LIMA-LAD, SVG-Ramus, SVG-OM, seq SVG-PLV-PDA.  . Diastolic CHF ([place])    followed by cardiology  . DOE (dyspnea on exertion)   . GERD (gastroesophageal reflux disease)   . Hematuria   . History of bladder stone   . History of non-ST elevation myocardial infarction (NSTEMI) 03/30/2005   post op recovery inguinal hernia repair  . History of recurrent UTIs    last admission 08-04-2018 pseudomonas UTI  . Hyperlipidemia   . Hyperplasia of prostate without lower urinary tract symptoms (LUTS)   . Hypertension   . Ischemic cardiomyopathy    echo (01/13) 45-50% septal akinesis, mild MR/  echo  (01/16) ef 60-65%, mid-apical anteroseptal hypokinesis;   echo 09-16-2016 ef 50-55%  . Left ureteral stone   . Lung nodule    noted 08/ 2018;  followed by pcp  . Moderate mitral regurgitation   . Orthostatic hypotension   . PAC (premature atrial contraction)   . Paroxysmal atrial flutter ([place])    a. dx 08/2016, started on amiodarone.;  08/ 2018 AFlutter  . Persistent atrial fibrillation followed by cardiologist-- dr Rockey Situ   dx AFib 02/ 2018  s/p  DCCV 04/ 2018;  08/ 2018  AFlutter w/ RVR in setting UTI  . Pulmonary hypertension ([place])    last echo in epic 09-16-2016  . Recurrent prostate cancer ([place]) UROLOGIST-- DR BELL   s/p  radioactive prostate seed implants in 1997;    due to PSA rising pt has been doing lupron injection's few past several yrs at dr bell office  . Renal calculi    bilateral per CT 06-18-2018  . S/P CABG x 5 04/04/2005   LIMA to LAD,  SVG to Ramus,  SVG to OM,  seqSVG to PLV and PDA  . Wears glasses     Past Surgical History:  Procedure Laterality Date  . CARDIAC CATHETERIZATION  04/02/2005   dr Doreatha Lew    Critical three-vessel coronary disease --  Essentially normal left ventricular function , ef 55%  . CARDIOVASCULAR STRESS TEST  05-01-2008  dr Doreatha Lew /   dr harding   Low risk nuclear study w/ no ischemia or scar but flattened septal motion/  ef 64%  . CARDIOVERSION N/A 11/19/2016   Procedure: CARDIOVERSION;  Surgeon: Jerline Pain, MD;  Location: [place];  Service: Cardiovascular;  Laterality: N/A;  . COLONOSCOPY N/A 10/23/2016   Procedure: COLONOSCOPY;  Surgeon: Milus Banister, MD;  Location: [facility];  Service: Endoscopy;  Laterality: N/A;  . CORONARY ARTERY BYPASS GRAFT  04/04/2005   dr  Ricard Dillon   LIMA - LAD,  SVG - Ramus, SVG - OM,  seqSVG - PLV and PDA  . CYSTOSCOPY WITH LITHOLAPAXY N/A 05/07/2016   Procedure: CYSTOSCOPY WITH LITHOLAPAXY;  Surgeon: Rana Snare, MD;  Location: [facility];  Service: Urology;  Laterality: N/A;  . HOLMIUM LASER APPLICATION N/A 97/35/3299   Procedure: HOLMIUM LASER APPLICATION;  Surgeon: Rana Snare, MD;  Location: [facility];  Service: Urology;  Laterality: N/A;  . INGUINAL HERNIA REPAIR Left 03-30-2005  dr Excell Seltzer   incarcerated   . RADIOACTIVE PROSTATE SEED IMPLANTS  1997  . TRANSTHORACIC ECHOCARDIOGRAM  08/24/2014   mid anteroseptal and distal septak hypokinetic,  mild focal basal LVH, ef 60-65%,  grade 1 diastolic dysfunction/  mild MR/  mild LAE/  mild dilated RV with normal RVSP/  trivial TR    MEDICATIONS: . apixaban (ELIQUIS) 5 MG TABS tablet  . acetaminophen (TYLENOL) 500 MG tablet  . amiodarone (PACERONE) 200 MG tablet  . hydrocortisone cream 1 %  . Menthol-Methyl Salicylate (MUSCLE RUB) 10-15 % CREA  . omeprazole (PRILOSEC) 40 MG capsule  . opium-belladonna (B&O SUPPRETTES) 16.2-60 MG suppository  . polyethylene glycol (MIRALAX / GLYCOLAX) packet  . rosuvastatin (CRESTOR) 40 MG tablet  . senna-docusate (SENOKOT-S) 8.6-50 MG tablet  . tamsulosin (FLOMAX) 0.4 MG CAPS capsule   No current facility-administered medications for this encounter.      Maia Plan [facility] Pre-Surgical Testing [phone] 09/02/18 4:39 PM

## 2018-09-02 NOTE — Telephone Encounter (Signed)
Called patient to clear for surgery. Last seen in office within 6 months. Patient was unavailable to speak on the phone. I was also told that his surgery was scheduled in the morning. I asked that he call our office this afternoon for clearance.   Clearance request was received at 2:09pm today.   Ledora Bottcher, PA-C 09/02/2018, 4:45 PM [facility] [address] [place], [place] [address]

## 2018-09-03 ENCOUNTER — Encounter (HOSPITAL_COMMUNITY)
Admission: RE | Disposition: A | Discharge status: Home / Self Care | Payer: Self-pay | Source: Home / Self Care | Attending: Urology

## 2018-09-03 ENCOUNTER — Ambulatory Visit (HOSPITAL_COMMUNITY): Payer: Medicare Other | Admitting: Anesthesiology

## 2018-09-03 ENCOUNTER — Ambulatory Visit (HOSPITAL_COMMUNITY)
Admission: RE | Admit: 2018-09-03 | Discharge: 2018-09-03 | Disposition: A | Discharge status: Home / Self Care | Payer: Medicare Other | Attending: Urology | Admitting: Urology

## 2018-09-03 ENCOUNTER — Encounter (HOSPITAL_COMMUNITY): Payer: Self-pay | Admitting: Certified Registered Nurse Anesthetist

## 2018-09-03 ENCOUNTER — Ambulatory Visit (HOSPITAL_COMMUNITY): Payer: Medicare Other | Admitting: Physician Assistant

## 2018-09-03 ENCOUNTER — Ambulatory Visit (HOSPITAL_COMMUNITY): Payer: Medicare Other

## 2018-09-03 ENCOUNTER — Other Ambulatory Visit: Payer: Self-pay

## 2018-09-03 DIAGNOSIS — C61 Malignant neoplasm of prostate: Secondary | ICD-10-CM | POA: Insufficient documentation

## 2018-09-03 DIAGNOSIS — N210 Calculus in bladder: Secondary | ICD-10-CM | POA: Insufficient documentation

## 2018-09-03 DIAGNOSIS — Z87891 Personal history of nicotine dependence: Secondary | ICD-10-CM | POA: Insufficient documentation

## 2018-09-03 DIAGNOSIS — E7800 Pure hypercholesterolemia, unspecified: Secondary | ICD-10-CM | POA: Diagnosis not present

## 2018-09-03 DIAGNOSIS — I10 Essential (primary) hypertension: Secondary | ICD-10-CM | POA: Insufficient documentation

## 2018-09-03 DIAGNOSIS — N201 Calculus of ureter: Secondary | ICD-10-CM | POA: Diagnosis not present

## 2018-09-03 DIAGNOSIS — Z7951 Long term (current) use of inhaled steroids: Secondary | ICD-10-CM | POA: Insufficient documentation

## 2018-09-03 DIAGNOSIS — E785 Hyperlipidemia, unspecified: Secondary | ICD-10-CM | POA: Insufficient documentation

## 2018-09-03 DIAGNOSIS — Z951 Presence of aortocoronary bypass graft: Secondary | ICD-10-CM | POA: Insufficient documentation

## 2018-09-03 DIAGNOSIS — K219 Gastro-esophageal reflux disease without esophagitis: Secondary | ICD-10-CM | POA: Insufficient documentation

## 2018-09-03 DIAGNOSIS — I2510 Atherosclerotic heart disease of native coronary artery without angina pectoris: Secondary | ICD-10-CM | POA: Insufficient documentation

## 2018-09-03 DIAGNOSIS — Z79899 Other long term (current) drug therapy: Secondary | ICD-10-CM | POA: Insufficient documentation

## 2018-09-03 HISTORY — PX: CYSTOSCOPY WITH RETROGRADE PYELOGRAM, URETEROSCOPY AND STENT PLACEMENT: SHX5789

## 2018-09-03 SURGERY — CYSTOURETEROSCOPY, WITH RETROGRADE PYELOGRAM AND STENT INSERTION
Anesthesia: General

## 2018-09-03 MED ORDER — LACTATED RINGERS IV SOLN
INTRAVENOUS | Status: DC
  Administered 2018-09-03: 11:00:00 via INTRAVENOUS

## 2018-09-03 MED ORDER — HYDROCODONE-ACETAMINOPHEN 5-325 MG PO TABS: 1.0000 | ORAL_TABLET | ORAL | 0 refills | Status: DC | PRN

## 2018-09-03 MED ORDER — PROPOFOL 10 MG/ML IV BOLUS
INTRAVENOUS | Status: DC | PRN
  Administered 2018-09-03: 30 mg via INTRAVENOUS
  Administered 2018-09-03: 40 mg via INTRAVENOUS

## 2018-09-03 MED ORDER — FENTANYL CITRATE (PF) 100 MCG/2ML IJ SOLN
25.0000 ug | INTRAMUSCULAR | Status: DC | PRN
  Administered 2018-09-03 (×2): 50 ug via INTRAVENOUS

## 2018-09-03 MED ORDER — PROPOFOL 10 MG/ML IV BOLUS
INTRAVENOUS | Status: AC
  Filled 2018-09-03: qty 20

## 2018-09-03 MED ORDER — FENTANYL CITRATE (PF) 100 MCG/2ML IJ SOLN
INTRAMUSCULAR | Status: DC | PRN
  Administered 2018-09-03: 25 ug via INTRAVENOUS
  Administered 2018-09-03: 50 ug via INTRAVENOUS
  Administered 2018-09-03: 25 ug via INTRAVENOUS

## 2018-09-03 MED ORDER — FENTANYL CITRATE (PF) 100 MCG/2ML IJ SOLN
INTRAMUSCULAR | Status: AC
  Filled 2018-09-03: qty 2

## 2018-09-03 MED ORDER — SODIUM CHLORIDE 0.9 % IV SOLN
1.0000 g | Freq: Three times a day (TID) | INTRAVENOUS | Status: DC
  Administered 2018-09-03: 1 g via INTRAVENOUS
  Filled 2018-09-03 (×6): qty 1

## 2018-09-03 MED ORDER — ONDANSETRON HCL 4 MG/2ML IJ SOLN
INTRAMUSCULAR | Status: DC | PRN
  Administered 2018-09-03: 4 mg via INTRAVENOUS

## 2018-09-03 MED ORDER — LIDOCAINE 2% (20 MG/ML) 5 ML SYRINGE
INTRAMUSCULAR | Status: DC | PRN
  Administered 2018-09-03: 60 mg via INTRAVENOUS

## 2018-09-03 MED ORDER — IOHEXOL 300 MG/ML  SOLN
INTRAMUSCULAR | Status: DC | PRN
  Administered 2018-09-03: 20 mL

## 2018-09-03 MED ORDER — 0.9 % SODIUM CHLORIDE (POUR BTL) OPTIME
TOPICAL | Status: DC | PRN
  Administered 2018-09-03: 500 mL

## 2018-09-03 MED ORDER — SODIUM CHLORIDE 0.9 % IR SOLN
Status: DC | PRN
  Administered 2018-09-03: 3000 mL

## 2018-09-03 MED ORDER — DEXAMETHASONE SODIUM PHOSPHATE 10 MG/ML IJ SOLN
INTRAMUSCULAR | Status: DC | PRN
  Administered 2018-09-03: 5 mg via INTRAVENOUS

## 2018-09-03 SURGICAL SUPPLY — 28 items
BAG URO CATCHER STRL LF (MISCELLANEOUS) ×4 IMPLANT
BASKET LASER NITINOL 1.9FR (BASKET) IMPLANT
BASKET ZERO TIP NITINOL 2.4FR (BASKET) IMPLANT
BSKT STON RTRVL 120 1.9FR (BASKET)
BSKT STON RTRVL ZERO TP 2.4FR (BASKET)
CATH INTERMIT  6FR 70CM (CATHETERS) ×1 IMPLANT
CATH URET 5FR 28IN CONE TIP (BALLOONS)
CATH URET 5FR 70CM CONE TIP (BALLOONS) IMPLANT
CLOTH BEACON ORANGE TIMEOUT ST (SAFETY) ×4 IMPLANT
COVER WAND RF STERILE (DRAPES) IMPLANT
EXTRACTOR STONE 1.7FRX115CM (UROLOGICAL SUPPLIES) IMPLANT
FIBER LASER FLEXIVA 1000 (UROLOGICAL SUPPLIES) ×3 IMPLANT
FIBER LASER FLEXIVA 365 (UROLOGICAL SUPPLIES) IMPLANT
FIBER LASER FLEXIVA 550 (UROLOGICAL SUPPLIES) ×3 IMPLANT
FIBER LASER TRAC TIP (UROLOGICAL SUPPLIES) ×1 IMPLANT
GLOVE BIO SURGEON STRL SZ7.5 (GLOVE) ×4 IMPLANT
GOWN STRL REUS W/TWL LRG LVL3 (GOWN DISPOSABLE) ×7 IMPLANT
GOWN STRL REUS W/TWL XL LVL3 (GOWN DISPOSABLE) ×4 IMPLANT
GUIDEWIRE ANG ZIPWIRE 038X150 (WIRE) IMPLANT
GUIDEWIRE STR DUAL SENSOR (WIRE) ×4 IMPLANT
MANIFOLD NEPTUNE II (INSTRUMENTS) ×4 IMPLANT
PACK CYSTO (CUSTOM PROCEDURE TRAY) ×4 IMPLANT
SHEATH URETERAL 12FRX28CM (UROLOGICAL SUPPLIES) IMPLANT
SHEATH URETERAL 12FRX35CM (MISCELLANEOUS) IMPLANT
STENT CONTOUR 6FRX26X.038 (STENTS) ×1 IMPLANT
SYRINGE IRR TOOMEY STRL 70CC (SYRINGE) IMPLANT
TUBING CONNECTING 10 (TUBING) IMPLANT
TUBING UROLOGY SET (TUBING) ×4 IMPLANT

## 2018-09-03 NOTE — Progress Notes (Signed)
PACU NURSING PHASE II NOTE: pt meets dc criteria to be DC to home, able to void (multiple times) w/o difficulty, able to ambulate to restroom with min assistance, tolerates po fluids well, vss, pain undercontrol. DC instructions reviewed with pt and family, importance of handwashing, post op diet and activity per Anesthesia, signs and symptoms of infection and time when to call MD were all explained and discussed with family. Pt escorted to exit via wheelchair.

## 2018-09-03 NOTE — Progress Notes (Signed)
Pharmacy Antibiotic Note  IZEAR PINE is a 83 y.o. male admitted on 09/03/2018 with hx of prostate CA and renal stones .  Pharmacy has been consulted for merrem dosing.  Plan: merrem 1gm IV q8h Follow renal function and cultures  Height: 5\' 11"  (180.3 cm) Weight: 163 lb 6 oz (74.1 kg) IBW/kg (Calculated) : 75.3  Temp (24hrs), Avg:97.8 F (36.6 C), Min:97.8 F (36.6 C), Max:97.8 F (36.6 C)  No results for input(s): WBC, CREATININE, LATICACIDVEN, VANCOTROUGH, VANCOPEAK, VANCORANDOM, GENTTROUGH, GENTPEAK, GENTRANDOM, TOBRATROUGH, TOBRAPEAK, TOBRARND, AMIKACINPEAK, AMIKACINTROU, AMIKACIN in the last 168 hours.  Estimated Creatinine Clearance: 52.3 mL/min (by C-G formula based on SCr of 1.18 mg/dL).    Allergies  Allergen Reactions  . Ciprofloxacin Other (See Comments)    PER HEART DR    Antimicrobials this admission: 2/7 merrem >> Dose adjustments this admission:   Microbiology results:  Thank you for allowing pharmacy to be a part of this patient's care.  Dolly Rias RPh 09/03/2018, 10:44 AM Pager [phone]

## 2018-09-03 NOTE — Op Note (Signed)
Operative Note  Preoperative diagnosis:  1.  Left ureteral calculi  Postoperative diagnosis: 1.  Left ureteral calculi  Procedure(s): 1.  Cystoscopy with left retrograde pyelogram, left ureteroscopy, laser lithotripsy, ureteral stent placement  Surgeon: Link Snuffer, MD  Assistants: None  Anesthesia: General  Complications: None immediate  EBL: Minimal  Specimens: 1.  None  Drains/Catheters: 1.  6 x 26 double-J ureteral stent  Intraoperative findings: 1.  Normal urethra and bladder 2.  Left retrograde pyelogram revealed no evidence of hydronephrosis.  No filling defects.  There were 3 small ureteral calculi fragmented to tiny fragments.  Indication: 83 year old male with multiple medical comorbidities presents for clearance of ureteral calculi  Description of procedure:  The patient was identified and consent was obtained.  The patient was taken to the operating room and placed in the supine position.  The patient was placed under general anesthesia.  Perioperative antibiotics were administered.  The patient was placed in dorsal lithotomy.  Patient was prepped and draped in a standard sterile fashion and a timeout was performed.  A 21 French rigid cystoscope was advanced into the urethra and into the bladder.  Complete cystoscopy was performed with no abnormal findings.  The left ureter was cannulated with a sensor wire which was advanced up to the kidney under fluoroscopic guidance.  A semirigid ureteroscope was advanced into the distal ureter and 3 stones were encountered which were fragmented to tiny fragments with the laser.  I advanced the scope up to the renal pelvis and no other stone fragments were seen.  I shot a retrograde pyelogram through the scope with the findings noted above.  I then carefully withdrew the scope and did not note any clinically significant fragments or any ureteral injury.  I backloaded the wire onto a rigid cystoscope and advanced that into the  bladder followed by routine placement of a 6 x 26 double-J ureteral stent in a standard fashion followed by removal of the wire.  Fluoroscopy confirmed proximal placement and direct visualization confirmed a good coil within the bladder.  The bladder was drained and the scope withdrawn.  The patient tolerated the procedure well and was stable postoperatively.  Plan: Follow-up in 1 week for stent removal.

## 2018-09-03 NOTE — Interval H&P Note (Signed)
History and Physical Interval Note:  09/03/2018 12:50 PM  Carl [name]  has presented today for surgery, with the diagnosis of LEFT URETERAL CALCULI, BLADDER STONE  The various methods of treatment have been discussed with the patient and family. After consideration of risks, benefits and other options for treatment, the patient has consented to  Procedure(s): LEFT URETEROSCOPY/HOLMIUM LASER/STENT PLACEMENT (Left) CYSTOSCOPY WITH LITHOLAPAXY (N/A) as a surgical intervention .  The patient's history has been reviewed, patient examined, no change in status, stable for surgery.  I have reviewed the patient's chart and labs.  Questions were answered to the patient's satisfaction.     Marton Redwood, III

## 2018-09-03 NOTE — Transfer of Care (Signed)
Immediate Anesthesia Transfer of Care Note  Patient: Carl [name]  Procedure(s) Performed: CYSTOSCOPY WITH RETROGRADE PYELOGRAM, URETEROSCOPY LASER LITHOTRIPSY AND STENT PLACEMENT  Patient Location: PACU  Anesthesia Type:General  Level of Consciousness: awake, alert  and oriented  Airway & Oxygen Therapy: Patient Spontanous Breathing and Patient connected to face mask oxygen  Post-op Assessment: Report given to RN and Post -op Vital signs reviewed and stable  Post vital signs: Reviewed and stable  Last Vitals:  Vitals Value Taken Time  BP 149/86 09/03/2018  1:52 PM  Temp    Pulse 67 09/03/2018  1:53 PM  Resp 12 09/03/2018  1:53 PM  SpO2 100 % 09/03/2018  1:53 PM  Vitals shown include unvalidated device data.  Last Pain:  Vitals:   09/03/18 1022  TempSrc: Oral         Complications: No apparent anesthesia complications

## 2018-09-03 NOTE — Discharge Instructions (Signed)

## 2018-09-03 NOTE — H&P (Signed)
CC/HPI: Physical on history of prostate cancer, renal calculi  HPI:  09/03/2017:  He has a somewhat complicated history in regards to his prostate cancer. In 1997 he underwent brachytherapy. He had a biochemical recurrence around 2004 at which point he was started on androgen deprivation. At one point he was on intermittent ADT. Sometime while he was on this, his primary care physician gave him testosterone replacement therapy which resulted in a great increase in his PSA. He was subsequently placed back on androgen deprivation therapy and has been on this since. Unfortunately he states that he was sick around the time of his last injection and therefore administered. His last injection was around May and he is supposed to get them every 4 months.   01/22/2018:  PSA was 4.96 on 12/31/2017. He was previously 4.52 on 09/03/2017. This is despite receiving Lupron at the last visit 4 months ago. Testosterone CASTRATE level and was 17.6. Patient has no complaints today. He was recently hospitalized for left distal ureteral calculi. He was placed on medical expulsive therapy. He states that he has passed multiple stones. He has a long-standing history of nephrolithiasis. On review of CT scan from 2 years prior, he had significant stone burden in the left kidney and this looks about unchanged. He was treated with antibiotics for UTI and has no complaints in this regard. He received a four-month Lupron at this time.   7.9.2019: Axumin PET/CT--asymmetric activity in the right posterior aspect could represent local recurrence. There was no evidence of skeletal/distant soft tissue/abdominal nodal metastasis. Focal uptake in the left frontal lobe as well as left CPA angle not well identified. This was favor to represent benign meningiomas.   07/14/2018:  PSA continues to slowly increase despite castrate levels of testosterone. Labs on 06/30/2018 revealed a PSA of 6.3 and castrate testosterone levels. Last metastatic workup  was in July which was negative. He has minimal complaints today. He does have persistent microscOpic hematuria. He does have a large amount of stone burden continued in the left kidney. There are also some nonobstructing right renal calculi. I do not obviously appreciate ureteral calculi.   07/29/18: Has not f/u with Dr. Alen Blew yet. Planning to reschedule his appointment.   08/11/2018  Patient was recently hospitalized with gross hematuria and Pseudomonas UTI. He has recovered somewhat from that. He presents today for cystoscopy given continued gross hematuria. He has not had a repeat CT scan yet.   08/19/2018  As stated above, the patient's PSA increases despite androgen deprivation therapy and castrate levels of testosterone. He has not seen Dr. Alen Blew yet. Cystoscopy last week did not reveal any evidence of malignancy. He did have some bladder calculi. He underwent a CT IVP which revealed multiple nonobstructing left ureteral calculi, unchanged. He also had a large left renal calculus and multiple small nonobstructing calculi in the right kidney. He has not had any further gross hematuria. No dysuria. No fever. He is frail with multiple medical comorbidities.Last Lupron given 07/14/2018 which was a 6 month injection.     ALLERGIES: Cipro - pt has A Fib "messes heart up " per daughterr    MEDICATIONS: Tamsulosin Hcl 0.4 mg capsule 1 capsule PO Daily  Acetaminophen 500 mg tablet  Amiodarone Hcl 200 mg tablet  Flonase Allergy Relief  Lupron Depot 30 mg (4 month) syringe kit 30 mg IM Administered by: Anell Barr  Omeprazole 40 mg capsule,delayed release  Os-Cal 500-Vit D3  Rosuvastatin Calcium 40 mg tablet  GU PSH: Cysto Bladder Stone >2.5cm - 05/07/2016 Cystoscopy - 08/11/2018, 04/07/2016 Locm 300-399Mg/Ml Iodine,1Ml - 08/16/2018, 04/04/2016      PSH Notes: Heart Surgery, Inguinal Hernia Repair   NON-GU PSH: CABG (coronary artery bypass grafting) Colonoscopy & Polypectomy Hernia Repair     GU PMH: Bladder Stone (Stable) - 08/11/2018, - 04/07/2016 Prostate Cancer - 08/11/2018, - 07/29/2018, - 07/14/2018 (Worsening), He does have evidence of recurrence of his prostate cancer. He had brachytherapy years ago. He has not received a Lupron injection in 6 months., - 06/30/2018 (Stable), - 01/22/2018, - 09/03/2017, Prostate cancer, - 2017 Gross hematuria - 07/29/2018 Microscopic hematuria (Stable) - 07/14/2018, - 03/21/2016 Renal calculus (Stable) - 07/14/2018, - 04/07/2016 Bacteriuria - 06/30/2018 Renal and ureteral calculus, In late November, he had a small right ureteral stone which may have passed. He had left distal ureteral calculi as well as bilateral renal calculi, left>> right. He is relatively asymptomatic but does have pyuria. - 06/30/2018 Rising PSA after prostate cancer treatment - 01/22/2018 Acute Cystitis/UTI - 09/03/2017 Chronic cystitis (w/o hematuria), Chronic cystitis - 2016 Weak Urinary Stream, Weak urinary stream - 2016 Dysuria, Dysuria - 2014 History of prostate cancer, Prostate Cancer - 2014 History of urolithiasis, Nephrolithiasis - 2014      PMH Notes:  Past Gu Hx:    Mr. Prusinski returns for follow-up. Again, his situation is fairly complicated. He had been a long-standing patient of Dr. Caesar Bookman and we assumed his care in 2006.[name] He was originally diagnosed with what appeared to be favorable prostate cancer 18 years ago( 1997). He had a seed implantation which subsequently failed. His PSA began to rise and it appears that around 2004 he was started on hormonal therapy. His testosterone fell but not all the way to castrate levels. His PSA went down to a nadir level of 1.1 in October 2006. It was elected to do intermittent hormonal therapy and his PSA slowly increased. His testosterone remained on the low side. Primary care physician was unaware of his diagnosis of prostate cancer and he was put on testosterone. With that, his PSA went up really quite dramatically. It got as high  as approximately 12.0. The patient's testosterone supplementation was subsequently discontinued, but his PSA continued to remain elevated. For that reason, we decided to go ahead and put him back on hormonal therapy. A bone scan was done in November in 2007 which was negative. He has been on Lupron therapy now since 09/2006. He still has occasional hot flashes but is doing pretty well. No voiding complaints. No other new systemic complaints. He does like to take usually about 2 pain pills a day to help with some chronic back discomfort. PSA 06/2007 down to 1.6.    Recent PSA in June 2014 was actually down slightly at 3.1. There's been a slow increase over the last couple of years. 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 DEXA scan was repeated in July of 2014. The hip showed some very mild osteopenia and spine was completely normal.     NON-GU PMH: Encounter for general adult medical examination without abnormal findings, Encounter for preventive health examination - 2016 Atrial Fibrillation Cardiac murmur, unspecified Congestive heart failure GERD Hypercholesterolemia Hypertension Orthostatic hypotension Polyp of colon    FAMILY HISTORY: 2 daughters - Daughter 1 son - Runs in Family Death - Mother, Father   SOCIAL HISTORY: Marital Status: Widowed Preferred Language: English; Ethnicity: Not Hispanic  Or Latino; Race: White Current Smoking Status: Patient does not smoke anymore. Has not smoked since 02/25/1966.  Does not drink anymore.  Drinks 1 caffeinated drink per day. Patient's occupation is/was Retired.     Notes: Former smoker   REVIEW OF SYSTEMS:    GU Review Male:   Patient reports frequent urination, hard to postpone urination, and get up at night to urinate. Patient denies burning/ pain with urination, leakage of urine, stream starts and stops, trouble starting your stream, have to  strain to urinate , erection problems, and penile pain.  Gastrointestinal (Upper):   Patient denies nausea, vomiting, and indigestion/ heartburn.  Gastrointestinal (Lower):   Patient denies diarrhea and constipation.  Constitutional:   Patient reports weight loss and fatigue. Patient denies fever and night sweats.  Skin:   Patient denies itching and skin rash/ lesion.  Eyes:   Patient denies blurred vision and double vision.  Ears/ Nose/ Throat:   Patient reports sinus problems. Patient denies sore throat.  Hematologic/Lymphatic:   Patient reports easy bruising. Patient denies swollen glands.  Cardiovascular:   Patient denies leg swelling and chest pains.  Respiratory:   Patient reports cough. Patient denies shortness of breath.  Endocrine:   Patient denies excessive thirst.  Musculoskeletal:   Patient reports back pain and joint pain.   Neurological:   Patient denies headaches and dizziness.  Psychologic:   Patient reports depression. Patient denies anxiety.   Notes: nocturia x 4-5    VITAL SIGNS:      08/19/2018 09:20 AM  Weight 162 lb / 73.48 kg  Height 70 in / 177.8 cm  BP 154/92 mmHg  Pulse 76 /min  Temperature 98.1 F / 36.7 C  BMI 23.2 kg/m   MULTI-SYSTEM PHYSICAL EXAMINATION:    Constitutional: Thin. No physical deformities. Normally developed. Good grooming. Frail  Respiratory: No labored breathing, no use of accessory muscles.   Cardiovascular: Normal temperature, adequate perfusion of extremities  Skin: No paleness, no jaundice  Neurologic / Psychiatric: Oriented to time, oriented to place, oriented to person. No depression, no anxiety, no agitation.  Gastrointestinal: No mass, no tenderness, no rigidity, non obese abdomen. No CVAT.  Eyes: Normal conjunctivae. Normal eyelids.  Musculoskeletal: Normal gait and station of head and neck.     PAST DATA REVIEWED:  Source Of History:  Patient  Records Review:   Previous Patient Records  X-Ray Review: C.T. Abdomen/Pelvis:  Reviewed Films. Reviewed Report. Discussed With Patient.     06/30/18 12/31/17 09/03/17 12/10/16 07/25/16 03/14/16 11/06/15 07/07/15  PSA  Total PSA 6.30 ng/mL 4.96 ng/mL 4.52 ng/mL 1.59 ng/dl 1.56 ng/dl 1.42  1.09  0.80     06/30/18 12/31/17 09/03/17 12/10/16 07/25/16 03/14/16 07/06/15 02/23/15  Hormones  Testosterone, Total <10 ng/dL 17.6 ng/dL 16.3 ng/dL 25.0 pg/dL 33.0 pg/dL 33.0 pg/dL 45  64     PROCEDURES:          Urinalysis w/Scope Dipstick Dipstick Cont'd Micro  Color: Yellow Bilirubin: Neg mg/dL WBC/hpf: >60/hpf  Appearance: Cloudy Ketones: Neg mg/dL RBC/hpf: 10 - 20/hpf  Specific Gravity: 1.025 Blood: 3+ ery/uL Bacteria: Mod (26-50/hpf)  pH: 6.0 Protein: 1+ mg/dL Cystals: Ca Oxalate  Glucose: Neg mg/dL Urobilinogen: 0.2 mg/dL Casts: NS (Not Seen)    Nitrites: Neg Trichomonas: Not Present    Leukocyte Esterase: 3+ leu/uL Mucous: Present      Epithelial Cells: NS (Not Seen)      Yeast: NS (Not Seen)      Sperm: Not Present  ASSESSMENT:      ICD-10 Details  1 GU:   Renal and ureteral calculus - N20.2 Stable  2   Bladder Stone - N21.0   3   Prostate Cancer - C61   4   Rising PSA after prostate cancer treatment - R97.21    PLAN:            Medications Stop Meds: Ultram 50 mg tablet 1 tablet PO Q 6 H p.r.n. pain  Start: 06/30/2018  Discontinue: 08/19/2018  - Reason: The medication cycle was completed.  Oxycodone-Acetaminophen 5 mg-325 mg tablet  Discontinue: 08/19/2018  - Reason: The medication cycle was completed.            Orders Labs Urine Culture          Document Letter(s):  Created for Patient: Clinical Summary         Notes:   Given her persistent ureteral calculi, recommend cystoscopy with left ureteroscopy, laser lithotripsy, ureteral stent placement. He understands potential risks including but not limited to bleeding, infection and potential sepsis, injury to surrounding structures including rare but possible ureteral avulsion, need for  additional procedures.   I discussed his other stones extensively including the large left renal calculus. Ideal management for this if we wanted to get stone free would be PCNL. Given his comorbidities and frail nature, recommend observation of this unless he continues to have problems. We will also observe the nonobstructing right renal calculi. I'll plan to just get his ureter clear and take care of the bladder stones.   Urine culture today   He will be due for another Lupron in May of this year. I have advised multiple times to see Dr. Alen Blew for further evaluation given the increase in PSA despite castrate levels of testosterone.   Cc: Dr Brunetta Genera, M.D.   Signed by Link Snuffer, III, M.D. on 08/19/18 at 9:53 AM (EST)

## 2018-09-03 NOTE — Anesthesia Postprocedure Evaluation (Signed)
Anesthesia Post Note  Patient: [name]  Procedure(s) Performed: CYSTOSCOPY WITH RETROGRADE PYELOGRAM, URETEROSCOPY LASER LITHOTRIPSY AND STENT PLACEMENT     Patient location during evaluation: PACU Anesthesia Type: General Level of consciousness: awake and alert Pain management: pain level controlled Vital Signs Assessment: post-procedure vital signs reviewed and stable Respiratory status: spontaneous breathing, nonlabored ventilation, respiratory function stable and patient connected to nasal cannula oxygen Cardiovascular status: blood pressure returned to baseline and stable Postop Assessment: no apparent nausea or vomiting Anesthetic complications: no    Last Vitals:  Vitals:   09/03/18 1415 09/03/18 1433  BP: (!) 160/65 (P) 137/82  Pulse: 71 (P) 74  Resp: 10 (P) 14  Temp: 36.5 C (P) 36.6 C  SpO2: 94% (P) 96%    Last Pain:  Vitals:   09/03/18 1433  TempSrc:   PainSc: (P) 0-No pain                 Effie Berkshire

## 2018-09-03 NOTE — Anesthesia Procedure Notes (Signed)
Procedure Name: LMA Insertion Performed by: Dallis Czaja J, CRNA Pre-anesthesia Checklist: Patient identified, Emergency Drugs available, Suction available, Patient being monitored and Timeout performed Patient Re-evaluated:Patient Re-evaluated prior to induction Oxygen Delivery Method: Circle system utilized Preoxygenation: Pre-oxygenation with 100% oxygen Induction Type: IV induction Ventilation: Mask ventilation without difficulty LMA: LMA inserted LMA Size: 4.0 Number of attempts: 1 Placement Confirmation: positive ETCO2 and breath sounds checked- equal and bilateral Tube secured with: Tape Dental Injury: Teeth and Oropharynx as per pre-operative assessment        

## 2018-09-04 ENCOUNTER — Encounter (HOSPITAL_COMMUNITY): Payer: Self-pay | Admitting: Urology

## 2018-09-07 ENCOUNTER — Telehealth: Payer: Self-pay | Admitting: Family Medicine

## 2018-09-07 NOTE — Telephone Encounter (Signed)
Patient already had surgery, will remove this request from preop pool

## 2018-09-07 NOTE — Telephone Encounter (Signed)
Called and spoke with the patient's daughter.  After reviewing the patient's chart it appears that he still has a stent in place.  I will send a message to the patient's urologist to determine if it is okay for him to go back on Eliquis.  I advised the patient's daughter at this time to not resume the Eliquis.  He will stay on low-dose aspirin.  We will contact him when we hear back from the urologist.

## 2018-09-07 NOTE — Telephone Encounter (Signed)
Called and spoke with pt's caretaker his daughter. Daughter stated that she was not advised to start him back on it but she will. She will stop the low dose Asprin and get him back on the eliquis.   Sent to PCP as an FYI and just to confirm pt should stop the low dose Asprin correct?

## 2018-09-07 NOTE — Telephone Encounter (Signed)
Please call the patient or his caregiver to find out if he was advised to restart his eliquis after his surgery. We previously discussed him going back on this after his urology surgery. Thanks.

## 2018-09-08 NOTE — Telephone Encounter (Signed)
I attempted to contact the patient and his daughter.  There is no answer.  Please call them on Thursday and advised that I heard back from the patient's urologist.  He noted it would be okay for the patient to go back on Eliquis.  The patient should resume his Eliquis and discontinue his aspirin.  Thanks.

## 2018-09-09 NOTE — Telephone Encounter (Signed)
Called and spoke with patient's care taker his daughter. Daughter advised and voiced understanding.

## 2018-09-14 ENCOUNTER — Other Ambulatory Visit: Payer: Medicare Other

## 2018-09-20 ENCOUNTER — Other Ambulatory Visit (HOSPITAL_COMMUNITY): Payer: Self-pay | Admitting: Urology

## 2018-09-20 DIAGNOSIS — N3000 Acute cystitis without hematuria: Secondary | ICD-10-CM

## 2018-09-21 ENCOUNTER — Other Ambulatory Visit: Payer: Medicare Other

## 2018-09-22 ENCOUNTER — Ambulatory Visit (HOSPITAL_COMMUNITY)
Admission: RE | Admit: 2018-09-22 | Discharge: 2018-09-22 | Disposition: A | Discharge status: Home / Self Care | Payer: Medicare Other | Source: Ambulatory Visit | Attending: Urology | Admitting: Urology

## 2018-09-22 DIAGNOSIS — N3000 Acute cystitis without hematuria: Secondary | ICD-10-CM

## 2018-09-22 MED ORDER — HEPARIN SOD (PORK) LOCK FLUSH 100 UNIT/ML IV SOLN
INTRAVENOUS | Status: AC
  Filled 2018-09-22: qty 5

## 2018-09-22 MED ORDER — LIDOCAINE HCL 1 % IJ SOLN
INTRAMUSCULAR | Status: AC
  Filled 2018-09-22: qty 20

## 2018-09-22 NOTE — Discharge Instructions (Signed)
PICC Home Care Guide ° °A peripherally inserted central catheter (PICC) is a form of IV access that allows medicines and IV fluids to be quickly distributed throughout the body. The PICC is a long, thin, flexible tube (catheter) that is inserted into a vein in the upper arm. The catheter ends in a large vein in the chest (superior vena cava, or SVC). After the PICC is inserted, a chest X-ray may be done to make sure that it is in the correct place. °A PICC may be placed for different reasons, such as: °· To give medicines and liquid nutrition. °· To give IV fluids and blood products. °· If there is trouble placing a peripheral intravenous (PIV) catheter. °If taken care of properly, a PICC can remain in place for several months. Having a PICC can also allow a person to go home from the hospital sooner. Medicine and PICC care can be managed at home by a family member, caregiver, or home health care team. °What are the risks? °Generally, having a PICC is safe. However, problems may occur, including: °· A blood clot (thrombus) forming in or at the tip of the PICC. °· A blood clot forming in a vein (deep vein thrombosis) or traveling to the lung (pulmonary embolism). °· Inflammation of the vein (phlebitis) in which the PICC is placed. °· Infection. Central line associated blood stream infection (CLABSI) is a serious infection that often requires hospitalization. °· PICC movement (malposition). The PICC tip may move from its original position due to excessive physical activity, forceful coughing, sneezing, or vomiting. °· A break or cut in the PICC. It is important not to use scissors near the PICC. °· Nerve or tendon irritation or injury during PICC insertion. °How to take care of your PICC °Preventing problems °· You and any caregivers should wash your hands often with soap. Wash hands: °? Before touching the PICC line or the infusion device. °? Before changing a bandage (dressing). °· Flush the PICC as told by your  health care provider. Let your health care provider know right away if the PICC is hard to flush or does not flush. Do not use force to flush the PICC. °· Do not use a syringe that is less than 10 mL to flush the PICC. °· Avoid blood pressure checks on the arm in which the PICC is placed. °· Never pull or tug on the PICC. °· Do not take the PICC out yourself. Only a trained clinical professional should remove the PICC. °· Use clean and sterile supplies only. Keep the supplies in a dry place. Do not reuse needles, syringes, or any other supplies. Doing that can lead to infection. °· Keep pets and children away from your PICC line. °· Check the PICC insertion site every day for signs of infection. Check for: °? Leakage. °? Redness, swelling, or pain. °? Fluid or blood. °? Warmth. °? Pus or a bad smell. °PICC dressing care °· Keep your PICC bandage (dressing) clean and dry to prevent infection. °· Do not take baths, swim, or use a hot tub until your health care provider approves. Ask your health care provider if you can take showers. You may only be allowed to take sponge baths for bathing. When you are allowed to shower: °? Ask your health care provider to teach you how to wrap the PICC line. °? Cover the PICC line with clear plastic wrap and tape to keep it dry while showering. °· Follow instructions from your health care provider   about how to take care of your insertion site and dressing. Make sure you: °? Wash your hands with soap and water before you change your bandage (dressing). If soap and water are not available, use hand sanitizer. °? Change your dressing as told by your health care provider. °? Leave stitches (sutures), skin glue, or adhesive strips in place. These skin closures may need to stay in place for 2 weeks or longer. If adhesive strip edges start to loosen and curl up, you may trim the loose edges. Do not remove adhesive strips completely unless your health care provider tells you to do  that. °· Change your PICC dressing if it becomes loose or wet. °General instructions ° °· Carry your PICC identification card or wear a medical alert bracelet at all times. °· Keep the tube clamped at all times, unless it is being used. °· Carry a smooth-edge clamp with you at all times to place on the tube if it breaks. °· Do not use scissors or sharp objects near the tube. °· You may bend your arm and move it freely. If your PICC is near or at the bend of your elbow, avoid activity with repeated motion at the elbow. °· Avoid lifting heavy objects as told by your health care provider. °· Keep all follow-up visits as told by your health care provider. This is important. °Disposal of supplies °· Throw away any syringes in a disposal container that is meant for sharp items (sharps container). You can buy a sharps container from a pharmacy, or you can make one by using an empty hard plastic bottle with a cover. °· Place any used dressings or infusion bags into a plastic bag. Throw that bag in the trash. °Contact a health care provider if: °· You have pain in your arm, ear, face, or teeth. °· You have a fever or chills. °· You have redness, swelling, or pain around the insertion site. °· You have fluid or blood coming from the insertion site. °· Your insertion site feels warm to the touch. °· You have pus or a bad smell coming from the insertion site. °· Your skin feels hard and raised around the insertion site. °Get help right away if: °· Your PICC is accidentally pulled all the way out. If this happens, cover the insertion site with a bandage or gauze dressing. Do not throw the PICC away. Your health care provider will need to check it. °· Your PICC was tugged or pulled and has partially come out. Do not  push the PICC back in. °· You cannot flush the PICC, it is hard to flush, or the PICC leaks around the insertion site when it is flushed. °· You hear a "flushing" sound when the PICC is flushed. °· You feel your  heart racing or skipping beats. °· There is a hole or tear in the PICC. °· You have swelling in the arm in which the PICC was inserted. °· You have a red streak going up your arm from where the PICC was inserted. °Summary °· A peripherally inserted central catheter (PICC) is a long, thin, flexible tube (catheter) that is inserted into a vein in the upper arm. °· The PICC is inserted using a sterile technique by a specially trained nurse or physician. Only a trained clinical professional should remove it. °· Keep your PICC identification card with you at all times. °· Avoid blood pressure checks on the arm in which the PICC is placed. °· If cared for   properly, a PICC can remain in place for several months. Having a PICC can also allow a person to go home from the hospital sooner. °This information is not intended to replace advice given to you by your health care provider. Make sure you discuss any questions you have with your health care provider. °Document Released: 01/18/2003 Document Revised: 08/16/2016 Document Reviewed: 08/16/2016 °Elsevier Interactive Patient Education © 2019 [employer]vier [employer]. ° °

## 2018-09-22 NOTE — Procedures (Signed)
Right SL basilic vein PICC placed without immediate complications. Length 48 cm. Tip SVC/RA junction. Cap placed. Medication used- 1% lidocaine to skin/SQ tissue. Ok to use. EBL none.

## 2018-09-23 ENCOUNTER — Telehealth: Payer: Self-pay | Admitting: Family Medicine

## 2018-09-23 NOTE — Telephone Encounter (Signed)
Copied from [place] [phone]. Topic: Quick Communication - Rx Refill/Question >> Sep 23, 2018 10:29 AM Reyne Dumas L wrote: Medication:  apixaban (ELIQUIS) 5 MG TABS tablet  Pt's daughter calling to find out if she can get samples of Eliquis for pt.

## 2018-09-24 ENCOUNTER — Other Ambulatory Visit: Payer: Medicare Other

## 2018-09-24 ENCOUNTER — Encounter

## 2018-09-24 MED ORDER — APIXABAN 5 MG PO TABS: 5.0000 mg | ORAL_TABLET | Freq: Every day | ORAL | 0 refills | Status: DC

## 2018-09-24 NOTE — Telephone Encounter (Signed)
Called and spoke with the patient's daughter.  She notes the patient has enough to get through the weekend.  We can provide 4 weeks of samples.  Please label these and contact the patient's daughter on Monday to let her know they are available to pick up.  I will also forward to our clinical pharmacist to contact the patient to see if he would qualify for patient assistance for this medication.

## 2018-09-27 NOTE — Telephone Encounter (Signed)
Contacted patient's daughter; explained the process for Eliquis patient assistance. Patient would need to spend 3% of his yearly household income at the pharmacy on copays to qualify (~$500).  She is going to contact his pharmacy to determine how much he has spent thus far so that we can project when he will qualify for the program, and give me a call back.   [name], PharmD, [place] PGY2 Ambulatory Care Pharmacy Resident, [place] [facility] Phone: [phone]

## 2018-09-29 ENCOUNTER — Telehealth: Payer: Self-pay | Admitting: Pharmacist

## 2018-09-29 ENCOUNTER — Telehealth: Payer: Self-pay | Admitting: Cardiovascular Disease

## 2018-09-29 ENCOUNTER — Other Ambulatory Visit: Payer: Medicare Other

## 2018-09-29 MED ORDER — APIXABAN 5 MG PO TABS: 5.0000 mg | ORAL_TABLET | Freq: Two times a day (BID) | ORAL | 0 refills | Status: DC

## 2018-09-29 NOTE — Telephone Encounter (Signed)
Called and spoke with the daughter and informed her that medication (eliquis) is ready for pickup at the front desk with instructions.  Gae Bon, CMA

## 2018-09-29 NOTE — Telephone Encounter (Signed)
° °  Pt daughter is asking if the below samples are available   apixaban (ELIQUIS) 5 MG TABS tablet

## 2018-09-29 NOTE — Telephone Encounter (Signed)
Returned the call to [name] the Pharmacist @ the patients pcp office Dr.Sonnenberg. [employer].  [name] received a message from [place] to look into the pt getting patient assistance for Elquis. She was calling to confirm the dose. Adv her of the last documentation that we have regarding the pt Eliquis.  Notes recorded by Rise Mu, PA-C on 07/13/2018 at 7:45 AM EST Patient has been managed on Eliquis 2.5 mg twice daily given his age and renal function being greater than 1.5. However, it appears his renal function typically hovers around 1.4-1.5 making dosing with Eliquis somewhat difficult. His Estimated Creatinine Clearance: 42.6 mL/min (A) (by C-G formula based on SCr of 1.45 mg/dL (H)). Given this, he would qualify for reduced dose Xarelto 15 mg daily which would be more consistent dosing and a once a day medication. Please check with patient and see if he would like to transition from Eliquis to [place]. If so, please send in Xarelto 15 mg once daily and he should start his Xarelto in place of his next Eliquis dose. Followed by no more Eliquis moving forward.  Adv her that the pt was given [name]'s recommendation and the he wanted to think about the medication change. He was to contact our office once he made the decision to switch.  [name] sts that the pt is currently taking Eliquis 5mg  daily. This change may have been made after a hospital d/c and has been refilled by Dr.Sonnenberg. She is unable to confirm with the pt pcp Dr.Sonnenberg since he is away from the office. [name] doesn't think that the pt will spend enough to qualify for pt assistance, and another alternative may need to be considered. She ask that a message be fwd to [employer]  who has seen the pt last for his recommendation. [name]'s response should be communicatde to the pt daughter and [name], Pharm-D @ [employer] station.

## 2018-09-29 NOTE — Telephone Encounter (Signed)
Pharmacist is calling from patient PCP, to discuss Eliquis directions, also states patient is having a hard time affording this medication.Please call to discuss

## 2018-09-29 NOTE — Telephone Encounter (Signed)
Patient's daughter notes that he has been taking Eliquis once daily, as he was previously instructed to do so. As this medication is normally dosed twice daily, want to clarify with cardiology.   Contacted [facility], left message.   As noted in the 2/27 phone note, patient is having a difficult time affording Eliquis. Unsure if patient would qualify for patient assistance for Eliquis, given current out of pocket spend (has to spend ~3% of yearly income on prescriptions at the pharmacy to qualify for [place] assistance. Will discuss this with cardiology when they return my call.   [name], PharmD, [place] PGY2 Ambulatory Care Pharmacy Resident, [place] [facility] Phone: [phone]

## 2018-09-29 NOTE — Telephone Encounter (Signed)
Received message from patient's daughter. He has been out of Eliquis for 3 days now.   Gae Bon, could you please prepare 2 bottles of Eliquis 5 mg samples for the patient? Please call the daughter when they are ready.   Per verbal report of the patient's social security income and estimated out of pocket spend at this time, his daughter does not think they will be able to afford to purchase Eliquis until they meet the ~3% out of pocket spend. Will outreach cardiology office to discuss plans moving forward.   [name], PharmD, [place] PGY2 Ambulatory Care Pharmacy Resident, [place] [facility] Phone: [phone]

## 2018-09-29 NOTE — Telephone Encounter (Signed)
Contacted [name], Pharm-D @ [employer] and made her aware of [name]'s recommendation. [name] ask that we f/u with the pt daughter Carl [name] regarding medication update. [name] sts that when speaking to the pt daughter she stated that the pt had been out of Eliquis for 2-3 days. The pt daughter is aware that samples of Eliquis have been left at [facility] front desk for pick up.  Spoke with the patient's daughter Carl [name]. Carl [name] is aware of [name], PA recommendation that the pt should be taking Eliquis 5mg  bid.. She sts that she plans on picking up the samples of Eliquis from the pt pcp office in the morning. Carl [name] says that she has previously contacted the pt insurance company in Jan 2020 regarding the cost of Eliquis vs Xarelto. The cost for the pt would be about the same. A one month supply of Eliquis would cost the pt $317. Adv her that I will update the pt medication list to reflect Eliquis 5mg  bid, she wants to hold off on sending a script to the pharmacy. Adv her that we can attempt to apply for pt assistance, even though [name] is doubtful it may be approved based on the criteria. [name] that the more cost effective alternative may be Coumadin. She is going to talk with the pt and call us back with his decision.

## 2018-09-29 NOTE — Telephone Encounter (Signed)
Received call back from [place] with [facility]. See cardiology phone note also dated today. Patient to be on Eliquis 5 mg BID or Xarelto 20 mg daily based on most recent renal function.   Lattie Haw contacted patient's daughter, [name], to discuss options moving forward. [facility] will be in charge of potential patient assistance for Eliquis or Xarelto moving forward for the patient.   Will communicate this with Dr. [name].   [name], PharmD, [place] PGY2 Ambulatory Care Pharmacy Resident, [place] [facility] Phone: [phone]

## 2018-09-29 NOTE — Telephone Encounter (Signed)
Called and spoke with patient's daughter. Daughter advised and plans to come by and pick up today or tomorrow.

## 2018-09-29 NOTE — Telephone Encounter (Signed)
Most recent renal function drawn in 07/2018 would indicate the patient should be on Eliquis 5 mg twice daily.  CrCl based on that lab draw is noted to be 52.33 mL/min which would place him on Xarelto 20 mg daily.  I recommend the patient check with his insurance and see if Xarelto is more affordable and let us know if either of these medications are feasible long-term.  I agree with the patient completing assistance paperwork.  If following this, he is unable to afford either DOAC long-term, we will need to consider transition to Coumadin, however per current guidelines this is less than ideal.

## 2018-09-29 NOTE — Telephone Encounter (Signed)
Spoke with pt daughter [name] and confirmed that per [name], PA instructions the pt is now be on Eliquis 5mg  twice daily. [name] verbalized understanding and sts that she wanted to be sure. She has no other questions at this time. Adv her to contact the office of any other questions or concerns.

## 2018-09-29 NOTE — Telephone Encounter (Signed)
Noted. Await patient decision.

## 2018-09-29 NOTE — Telephone Encounter (Signed)
Pt c/o medication issue:  1. Name of Medication: Eliquis   2. How are you currently taking this medication (dosage and times per day)? Please clarify   3. Are you having a reaction (difficulty breathing--STAT)? No   4. What is your medication issue? Patient daughter calling to clarify new med dose.  She states previous dose was 2.5 mg BID.  Please call to confirm new dose

## 2018-10-03 ENCOUNTER — Other Ambulatory Visit
Admission: RE | Admit: 2018-10-03 | Discharge: 2018-10-03 | Disposition: A | Discharge status: Home / Self Care | Payer: Medicare Other | Source: Ambulatory Visit | Attending: Urology | Admitting: Urology

## 2018-10-03 LAB — COMPREHENSIVE METABOLIC PANEL
ALT: 18 U/L (ref 0–44)
AST: 20 U/L (ref 15–41)
Albumin: 2.9 g/dL — ABNORMAL LOW (ref 3.5–5.0)
Alkaline Phosphatase: 64 U/L (ref 38–126)
Anion gap: 6 (ref 5–15)
BUN: 23 mg/dL (ref 8–23)
CO2: 23 mmol/L (ref 22–32)
Calcium: 8 mg/dL — ABNORMAL LOW (ref 8.9–10.3)
Chloride: 111 mmol/L (ref 98–111)
Creatinine, Ser: 1.16 mg/dL (ref 0.61–1.24)
GFR calc Af Amer: >60 mL/min (ref >60)
GFR calc non Af Amer: 59 mL/min — ABNORMAL LOW (ref >60)
Glucose, Bld: 120 mg/dL — ABNORMAL HIGH (ref 70–99)
Potassium: 3.7 mmol/L (ref 3.5–5.1)
Sodium: 140 mmol/L (ref 135–145)
Total Bilirubin: 0.5 mg/dL (ref 0.3–1.2)
Total Protein: 5.5 g/dL — ABNORMAL LOW (ref 6.5–8.1)

## 2018-10-03 LAB — CBC WITH DIFFERENTIAL/PLATELET
Abs Immature Granulocytes: 0.02 10*3/uL (ref 0.00–0.07)
Basophils Absolute: 0 10*3/uL (ref 0.0–0.1)
Basophils Relative: 1 %
Eosinophils Absolute: 0.1 10*3/uL (ref 0.0–0.5)
Eosinophils Relative: 2 %
HCT: 33.8 % — ABNORMAL LOW (ref 39.0–52.0)
Hemoglobin: 10.8 g/dL — ABNORMAL LOW (ref 13.0–17.0)
Immature Granulocytes: 0 %
Lymphocytes Relative: 20 %
Lymphs Abs: 1.1 10*3/uL (ref 0.7–4.0)
MCH: 31.5 pg (ref 26.0–34.0)
MCHC: 32 g/dL (ref 30.0–36.0)
MCV: 98.5 fL (ref 80.0–100.0)
Monocytes Absolute: 0.3 10*3/uL (ref 0.1–1.0)
Monocytes Relative: 6 %
Neutro Abs: 3.9 10*3/uL (ref 1.7–7.7)
Neutrophils Relative %: 71 %
Platelets: 168 10*3/uL (ref 150–400)
RBC: 3.43 MIL/uL — ABNORMAL LOW (ref 4.22–5.81)
RDW: 13.6 % (ref 11.5–15.5)
WBC: 5.4 10*3/uL (ref 4.0–10.5)
nRBC: 0 % (ref 0.0–0.2)

## 2018-10-05 ENCOUNTER — Emergency Department (HOSPITAL_COMMUNITY): Payer: Medicare Other

## 2018-10-05 ENCOUNTER — Inpatient Hospital Stay (HOSPITAL_COMMUNITY): Payer: Medicare Other

## 2018-10-05 ENCOUNTER — Inpatient Hospital Stay (HOSPITAL_COMMUNITY)
Admission: EM | Admit: 2018-10-05 | Discharge: 2018-10-11 | DRG: 956 | Disposition: A | Discharge status: Skilled Nursing Facility | Payer: Medicare Other | Attending: Internal Medicine | Admitting: Internal Medicine

## 2018-10-05 ENCOUNTER — Ambulatory Visit: Payer: Self-pay | Admitting: Family Medicine

## 2018-10-05 ENCOUNTER — Encounter (HOSPITAL_COMMUNITY): Payer: Self-pay | Admitting: Emergency Medicine

## 2018-10-05 ENCOUNTER — Other Ambulatory Visit: Payer: Self-pay

## 2018-10-05 DIAGNOSIS — Y92012 Bathroom of single-family (private) house as the place of occurrence of the external cause: Secondary | ICD-10-CM

## 2018-10-05 DIAGNOSIS — Z87891 Personal history of nicotine dependence: Secondary | ICD-10-CM | POA: Diagnosis not present

## 2018-10-05 DIAGNOSIS — R5381 Other malaise: Secondary | ICD-10-CM

## 2018-10-05 DIAGNOSIS — I2510 Atherosclerotic heart disease of native coronary artery without angina pectoris: Secondary | ICD-10-CM | POA: Diagnosis present

## 2018-10-05 DIAGNOSIS — I10 Essential (primary) hypertension: Secondary | ICD-10-CM | POA: Diagnosis not present

## 2018-10-05 DIAGNOSIS — S72009A Fracture of unspecified part of neck of unspecified femur, initial encounter for closed fracture: Secondary | ICD-10-CM

## 2018-10-05 DIAGNOSIS — N183 Chronic kidney disease, stage 3 unspecified: Secondary | ICD-10-CM | POA: Diagnosis present

## 2018-10-05 DIAGNOSIS — E785 Hyperlipidemia, unspecified: Secondary | ICD-10-CM | POA: Diagnosis present

## 2018-10-05 DIAGNOSIS — Z419 Encounter for procedure for purposes other than remedying health state, unspecified: Secondary | ICD-10-CM

## 2018-10-05 DIAGNOSIS — S0689AA Other specified intracranial injury with loss of consciousness status unknown, initial encounter: Secondary | ICD-10-CM | POA: Diagnosis present

## 2018-10-05 DIAGNOSIS — N400 Enlarged prostate without lower urinary tract symptoms: Secondary | ICD-10-CM | POA: Diagnosis present

## 2018-10-05 DIAGNOSIS — S72002D Fracture of unspecified part of neck of left femur, subsequent encounter for closed fracture with routine healing: Secondary | ICD-10-CM | POA: Diagnosis not present

## 2018-10-05 DIAGNOSIS — S06899A Other specified intracranial injury with loss of consciousness of unspecified duration, initial encounter: Secondary | ICD-10-CM | POA: Diagnosis present

## 2018-10-05 DIAGNOSIS — Z809 Family history of malignant neoplasm, unspecified: Secondary | ICD-10-CM

## 2018-10-05 DIAGNOSIS — Z8546 Personal history of malignant neoplasm of prostate: Secondary | ICD-10-CM | POA: Diagnosis not present

## 2018-10-05 DIAGNOSIS — S064X0A Epidural hemorrhage without loss of consciousness, initial encounter: Secondary | ICD-10-CM

## 2018-10-05 DIAGNOSIS — I272 Pulmonary hypertension, unspecified: Secondary | ICD-10-CM | POA: Diagnosis present

## 2018-10-05 DIAGNOSIS — Z951 Presence of aortocoronary bypass graft: Secondary | ICD-10-CM | POA: Diagnosis not present

## 2018-10-05 DIAGNOSIS — K219 Gastro-esophageal reflux disease without esophagitis: Secondary | ICD-10-CM | POA: Diagnosis present

## 2018-10-05 DIAGNOSIS — Z01811 Encounter for preprocedural respiratory examination: Secondary | ICD-10-CM

## 2018-10-05 DIAGNOSIS — Z8249 Family history of ischemic heart disease and other diseases of the circulatory system: Secondary | ICD-10-CM

## 2018-10-05 DIAGNOSIS — E860 Dehydration: Secondary | ICD-10-CM | POA: Diagnosis present

## 2018-10-05 DIAGNOSIS — I509 Heart failure, unspecified: Secondary | ICD-10-CM

## 2018-10-05 DIAGNOSIS — Z79899 Other long term (current) drug therapy: Secondary | ICD-10-CM

## 2018-10-05 DIAGNOSIS — S72001A Fracture of unspecified part of neck of right femur, initial encounter for closed fracture: Secondary | ICD-10-CM | POA: Diagnosis present

## 2018-10-05 DIAGNOSIS — R296 Repeated falls: Secondary | ICD-10-CM | POA: Diagnosis present

## 2018-10-05 DIAGNOSIS — I4811 Longstanding persistent atrial fibrillation: Secondary | ICD-10-CM | POA: Diagnosis present

## 2018-10-05 DIAGNOSIS — I255 Ischemic cardiomyopathy: Secondary | ICD-10-CM | POA: Diagnosis present

## 2018-10-05 DIAGNOSIS — Z7901 Long term (current) use of anticoagulants: Secondary | ICD-10-CM

## 2018-10-05 DIAGNOSIS — S72091A Other fracture of head and neck of right femur, initial encounter for closed fracture: Principal | ICD-10-CM | POA: Diagnosis present

## 2018-10-05 DIAGNOSIS — W010XXA Fall on same level from slipping, tripping and stumbling without subsequent striking against object, initial encounter: Secondary | ICD-10-CM | POA: Diagnosis present

## 2018-10-05 DIAGNOSIS — I130 Hypertensive heart and chronic kidney disease with heart failure and stage 1 through stage 4 chronic kidney disease, or unspecified chronic kidney disease: Secondary | ICD-10-CM | POA: Diagnosis present

## 2018-10-05 DIAGNOSIS — S06360D Traumatic hemorrhage of cerebrum, unspecified, without loss of consciousness, subsequent encounter: Secondary | ICD-10-CM | POA: Diagnosis not present

## 2018-10-05 DIAGNOSIS — I4819 Other persistent atrial fibrillation: Secondary | ICD-10-CM | POA: Diagnosis present

## 2018-10-05 DIAGNOSIS — W19XXXA Unspecified fall, initial encounter: Secondary | ICD-10-CM | POA: Diagnosis present

## 2018-10-05 DIAGNOSIS — M25551 Pain in right hip: Secondary | ICD-10-CM | POA: Diagnosis present

## 2018-10-05 DIAGNOSIS — I5042 Chronic combined systolic (congestive) and diastolic (congestive) heart failure: Secondary | ICD-10-CM | POA: Diagnosis present

## 2018-10-05 DIAGNOSIS — Y92009 Unspecified place in unspecified non-institutional (private) residence as the place of occurrence of the external cause: Secondary | ICD-10-CM | POA: Diagnosis present

## 2018-10-05 DIAGNOSIS — S06369A Traumatic hemorrhage of cerebrum, unspecified, with loss of consciousness of unspecified duration, initial encounter: Secondary | ICD-10-CM

## 2018-10-05 DIAGNOSIS — I714 Abdominal aortic aneurysm, without rupture: Secondary | ICD-10-CM | POA: Diagnosis present

## 2018-10-05 DIAGNOSIS — I252 Old myocardial infarction: Secondary | ICD-10-CM

## 2018-10-05 HISTORY — DX: Other specified intracranial injury with loss of consciousness of unspecified duration, initial encounter: S06.899A

## 2018-10-05 HISTORY — DX: Fracture of unspecified part of neck of unspecified femur, initial encounter for closed fracture: S72.009A

## 2018-10-05 HISTORY — DX: Other specified intracranial injury with loss of consciousness status unknown, initial encounter: S06.89AA

## 2018-10-05 LAB — CBC WITH DIFFERENTIAL/PLATELET
Abs Immature Granulocytes: 0.02 10*3/uL (ref 0.00–0.07)
Basophils Absolute: 0 10*3/uL (ref 0.0–0.1)
Basophils Relative: 0 %
Eosinophils Absolute: 0 10*3/uL (ref 0.0–0.5)
Eosinophils Relative: 0 %
HCT: 33 % — ABNORMAL LOW (ref 39.0–52.0)
Hemoglobin: 10.5 g/dL — ABNORMAL LOW (ref 13.0–17.0)
Immature Granulocytes: 0 %
Lymphocytes Relative: 7 %
Lymphs Abs: 0.6 10*3/uL — ABNORMAL LOW (ref 0.7–4.0)
MCH: 32.3 pg (ref 26.0–34.0)
MCHC: 31.8 g/dL (ref 30.0–36.0)
MCV: 101.5 fL — ABNORMAL HIGH (ref 80.0–100.0)
Monocytes Absolute: 0.5 10*3/uL (ref 0.1–1.0)
Monocytes Relative: 6 %
Neutro Abs: 7.1 10*3/uL (ref 1.7–7.7)
Neutrophils Relative %: 87 %
Platelets: 143 10*3/uL — ABNORMAL LOW (ref 150–400)
RBC: 3.25 MIL/uL — ABNORMAL LOW (ref 4.22–5.81)
RDW: 13.8 % (ref 11.5–15.5)
WBC: 8.2 10*3/uL (ref 4.0–10.5)
nRBC: 0 % (ref 0.0–0.2)

## 2018-10-05 LAB — COMPREHENSIVE METABOLIC PANEL
ALT: 25 U/L (ref 0–44)
AST: 20 U/L (ref 15–41)
Albumin: 3 g/dL — ABNORMAL LOW (ref 3.5–5.0)
Alkaline Phosphatase: 66 U/L (ref 38–126)
Anion gap: 5 (ref 5–15)
BUN: 22 mg/dL (ref 8–23)
CO2: 24 mmol/L (ref 22–32)
Calcium: 8.2 mg/dL — ABNORMAL LOW (ref 8.9–10.3)
Chloride: 110 mmol/L (ref 98–111)
Creatinine, Ser: 0.99 mg/dL (ref 0.61–1.24)
GFR calc Af Amer: >60 mL/min (ref >60)
GFR calc non Af Amer: >60 mL/min (ref >60)
Glucose, Bld: 114 mg/dL — ABNORMAL HIGH (ref 70–99)
Potassium: 3.9 mmol/L (ref 3.5–5.1)
Sodium: 139 mmol/L (ref 135–145)
Total Bilirubin: 0.5 mg/dL (ref 0.3–1.2)
Total Protein: 5.7 g/dL — ABNORMAL LOW (ref 6.5–8.1)

## 2018-10-05 LAB — BRAIN NATRIURETIC PEPTIDE: B Natriuretic Peptide: 181.6 pg/mL — ABNORMAL HIGH (ref 0.0–100.0)

## 2018-10-05 MED ORDER — ENOXAPARIN SODIUM 40 MG/0.4ML ~~LOC~~ SOLN: 40.0000 mg | SUBCUTANEOUS | Status: DC

## 2018-10-05 MED ORDER — CHLORHEXIDINE GLUCONATE 4 % EX LIQD
60.0000 mL | Freq: Once | CUTANEOUS | Status: AC
  Administered 2018-10-05: 4 via TOPICAL
  Filled 2018-10-05: qty 15

## 2018-10-05 MED ORDER — POLYETHYLENE GLYCOL 3350 17 G PO PACK
17.0000 g | PACK | Freq: Every day | ORAL | Status: DC | PRN
  Administered 2018-10-09: 17 g via ORAL
  Filled 2018-10-05: qty 1

## 2018-10-05 MED ORDER — BELLADONNA ALKALOIDS-OPIUM 16.2-60 MG RE SUPP: 1.0000 | Freq: Four times a day (QID) | RECTAL | Status: DC | PRN

## 2018-10-05 MED ORDER — CEFAZOLIN SODIUM-DEXTROSE 2-4 GM/100ML-% IV SOLN: 2.0000 g | INTRAVENOUS | Status: DC

## 2018-10-05 MED ORDER — POLYETHYLENE GLYCOL 3350 17 G PO PACK
17.0000 g | PACK | Freq: Every day | ORAL | Status: DC
  Administered 2018-10-06: 17 g via ORAL
  Filled 2018-10-05: qty 1

## 2018-10-05 MED ORDER — PANTOPRAZOLE SODIUM 40 MG PO TBEC
40.0000 mg | DELAYED_RELEASE_TABLET | Freq: Every day | ORAL | Status: DC
  Administered 2018-10-05 – 2018-10-11 (×7): 40 mg via ORAL
  Filled 2018-10-05 (×7): qty 1

## 2018-10-05 MED ORDER — ACETAMINOPHEN 500 MG PO TABS: 1000.0000 mg | ORAL_TABLET | Freq: Four times a day (QID) | ORAL | Status: DC | PRN

## 2018-10-05 MED ORDER — METHOCARBAMOL 1000 MG/10ML IJ SOLN
500.0000 mg | Freq: Four times a day (QID) | INTRAVENOUS | Status: DC | PRN
  Filled 2018-10-05: qty 5

## 2018-10-05 MED ORDER — FENTANYL CITRATE (PF) 100 MCG/2ML IJ SOLN
100.0000 ug | Freq: Once | INTRAMUSCULAR | Status: AC
  Administered 2018-10-05: 100 ug via INTRAVENOUS
  Filled 2018-10-05: qty 2

## 2018-10-05 MED ORDER — SENNOSIDES-DOCUSATE SODIUM 8.6-50 MG PO TABS
1.0000 | ORAL_TABLET | Freq: Two times a day (BID) | ORAL | Status: DC
  Administered 2018-10-05 – 2018-10-06 (×3): 1 via ORAL
  Filled 2018-10-05 (×3): qty 1

## 2018-10-05 MED ORDER — MORPHINE SULFATE (PF) 2 MG/ML IV SOLN
0.5000 mg | INTRAVENOUS | Status: DC | PRN
  Administered 2018-10-07 (×2): 0.5 mg via INTRAVENOUS
  Filled 2018-10-05 (×2): qty 1

## 2018-10-05 MED ORDER — HYDROCODONE-ACETAMINOPHEN 5-325 MG PO TABS
1.0000 | ORAL_TABLET | Freq: Four times a day (QID) | ORAL | Status: DC | PRN
  Administered 2018-10-05 – 2018-10-06 (×5): 2 via ORAL
  Filled 2018-10-05 (×5): qty 2

## 2018-10-05 MED ORDER — METHOCARBAMOL 500 MG PO TABS
500.0000 mg | ORAL_TABLET | Freq: Four times a day (QID) | ORAL | Status: DC | PRN
  Administered 2018-10-05 – 2018-10-06 (×3): 500 mg via ORAL
  Filled 2018-10-05 (×3): qty 1

## 2018-10-05 MED ORDER — HYDROCODONE-ACETAMINOPHEN 5-325 MG PO TABS
2.0000 | ORAL_TABLET | Freq: Once | ORAL | Status: AC
  Administered 2018-10-05: 2 via ORAL
  Filled 2018-10-05: qty 2

## 2018-10-05 MED ORDER — POVIDONE-IODINE 10 % EX SWAB: 2.0000 "application " | Freq: Once | CUTANEOUS | Status: DC

## 2018-10-05 MED ORDER — BACITRACIN ZINC 500 UNIT/GM EX OINT
1.0000 "application " | TOPICAL_OINTMENT | Freq: Two times a day (BID) | CUTANEOUS | Status: DC
  Administered 2018-10-05 – 2018-10-11 (×12): 1 via TOPICAL
  Filled 2018-10-05 (×2): qty 28.35
  Filled 2018-10-05: qty 0.9

## 2018-10-05 MED ORDER — HEPARIN (PORCINE) 25000 UT/250ML-% IV SOLN: 14.0000 [IU]/kg/h | INTRAVENOUS | Status: DC

## 2018-10-05 MED ORDER — SORBITOL 70 % SOLN
30.0000 mL | Freq: Every day | Status: DC | PRN
  Filled 2018-10-05: qty 30

## 2018-10-05 MED ORDER — TAMSULOSIN HCL 0.4 MG PO CAPS
0.4000 mg | ORAL_CAPSULE | Freq: Every day | ORAL | Status: DC
  Administered 2018-10-05 – 2018-10-11 (×6): 0.4 mg via ORAL
  Filled 2018-10-05 (×6): qty 1

## 2018-10-05 MED ORDER — SODIUM CHLORIDE 0.9% FLUSH: 10.0000 mL | INTRAVENOUS | Status: DC | PRN

## 2018-10-05 MED ORDER — ROSUVASTATIN CALCIUM 20 MG PO TABS
40.0000 mg | ORAL_TABLET | Freq: Every day | ORAL | Status: DC
  Administered 2018-10-05 – 2018-10-07 (×3): 40 mg via ORAL
  Filled 2018-10-05 (×3): qty 2

## 2018-10-05 MED ORDER — AMIODARONE HCL 200 MG PO TABS
200.0000 mg | ORAL_TABLET | Freq: Every day | ORAL | Status: DC
  Administered 2018-10-06 – 2018-10-07 (×2): 200 mg via ORAL
  Filled 2018-10-05 (×2): qty 1

## 2018-10-05 NOTE — ED Provider Notes (Signed)
[facility] DEPT Provider Note   CSN: [id number] Arrival date & time: 10/05/18  1324    History   Chief Complaint Chief Complaint  Patient presents with  . Fall    HPI ORLIN KANN is a 83 y.o. male.     83yo M w/ PMH incluidng CAD s/p CABG, CHF, CKD, AAA, A flutter, pulm HTN, frequent UTIs currently on IV abx through PICC who p/w falls. PT tripped last night and fell onto R side, was helped up and was ambulatory afterwards, no head injury or LOC. This morning, he slipped in the bathroom and again fell onto R side/hip. No LOC or head injury. He reports pain in hip/R side, no chest/abdominal/head/extremity pain. He did sustain skin tear on buttocks. Denies breathing problems or other areas of pain. Tetanus UTD.  The history is provided by the patient.  Fall     Past Medical History:  Diagnosis Date  . AAA (abdominal aortic aneurysm) ([place])    CT abd. dated 06-18-2018 in epic,  3.1cm;  followed by pcp  . Arthritis    arms  . Bladder calculus   . Chronic anemia   . Chronic anxiety   . CKD (chronic kidney disease), stage III ([place])   . Coronary artery disease CARDIOLOGIST-  DR Rockey Situ   a. CABG 2006 with  LIMA-LAD, SVG-Ramus, SVG-OM, seq SVG-PLV-PDA.  . Diastolic CHF ([place])    followed by cardiology  . DOE (dyspnea on exertion)   . GERD (gastroesophageal reflux disease)   . Hematuria   . History of bladder stone   . History of non-ST elevation myocardial infarction (NSTEMI) 03/30/2005   post op recovery inguinal hernia repair  . History of recurrent UTIs    last admission 08-04-2018 pseudomonas UTI  . Hyperlipidemia   . Hyperplasia of prostate without lower urinary tract symptoms (LUTS)   . Hypertension   . Ischemic cardiomyopathy    echo (01/13) 45-50% septal akinesis, mild MR/  echo  (01/16) ef 60-65%, mid-apical anteroseptal hypokinesis;   echo 09-16-2016 ef 50-55%  . Left ureteral stone   . Lung nodule    noted 08/ 2018;   followed by pcp  . Moderate mitral regurgitation   . Orthostatic hypotension   . PAC (premature atrial contraction)   . Paroxysmal atrial flutter ([place])    a. dx 08/2016, started on amiodarone.;  08/ 2018 AFlutter  . Persistent atrial fibrillation followed by cardiologist-- dr Rockey Situ   dx AFib 02/ 2018  s/p  DCCV 04/ 2018;  08/ 2018  AFlutter w/ RVR in setting UTI  . Pulmonary hypertension ([place])    last echo in epic 09-16-2016  . Recurrent prostate cancer ([place]) UROLOGIST-- DR BELL   s/p  radioactive prostate seed implants in 1997;    due to PSA rising pt has been doing lupron injection's few past several yrs at dr bell office  . Renal calculi    bilateral per CT 06-18-2018  . S/P CABG x 5 04/04/2005   LIMA to LAD,  SVG to Ramus,  SVG to OM,  seqSVG to PLV and PDA  . Wears glasses     Patient Active Problem List   Diagnosis Date Noted  . AAA (abdominal aortic aneurysm) ([place]) 08/26/2018  . Pseudomonas aeruginosa infection 08/06/2018  . Acute cystitis with hematuria   . Pseudomonas urinary tract infection 08/05/2018  . Hematuria 08/05/2018  . Pressure injury of skin 08/05/2018  . Kidney stones 06/30/2018  . Allergic rhinitis 04/26/2018  .  Skin tear of elbow without complication 24/23/5361  . Complicated UTI (urinary tract infection) 01/05/2018  . Urolithiasis 01/05/2018  . Frequent urination 01/01/2018  . Colon polyps 09/15/2017  . GERD (gastroesophageal reflux disease) 09/15/2017  . Wrist pain, acute, right 05/25/2017  . Low back pain 03/24/2017  . Lung nodule seen on imaging study: 7 mm nodule left midlung 03/13/2017  . Physical deconditioning 03/13/2017  . Normocytic anemia 03/13/2017  . Fall 03/12/2017  . Abdominal soreness 03/12/2017  . Generalized weakness 03/12/2017  . Leukocytosis 03/12/2017  . Hyponatremia 03/12/2017  . Orthostatic hypotension 02/16/2017  . Blood in stool 09/19/2016  . Anticoagulated 09/19/2016  . History of prostate cancer 09/04/2016  .  Persistent atrial fibrillation 09/04/2016  . Acute congestive heart failure ([place]) 09/04/2016  . Cardiomyopathy, ischemic 07/16/2012  . Chronic kidney disease (CKD), stage III (moderate) ([place]) 07/16/2012  . Essential hypertension 08/05/2011  . Murmur 08/05/2011  . 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 Dyslipidemia 02/04/2011    Past Surgical History:  Procedure Laterality Date  . CARDIAC CATHETERIZATION  04/02/2005   dr Doreatha Lew    Critical three-vessel coronary disease --  Essentially normal left ventricular function , ef 55%  . CARDIOVASCULAR STRESS TEST  05-01-2008  dr Doreatha Lew /  dr harding   Low risk nuclear study w/ no ischemia or scar but flattened septal motion/  ef 64%  . CARDIOVERSION N/A 11/19/2016   Procedure: CARDIOVERSION;  Surgeon: Jerline Pain, MD;  Location: [facility];  Service: Cardiovascular;  Laterality: N/A;  . COLONOSCOPY N/A 10/23/2016   Procedure: COLONOSCOPY;  Surgeon: Milus Banister, MD;  Location: [facility];  Service: Endoscopy;  Laterality: N/A;  . CORONARY ARTERY BYPASS GRAFT  04/04/2005   dr  Ricard Dillon   LIMA - LAD,  SVG - Ramus, SVG - OM,  seqSVG - PLV and PDA  . CYSTOSCOPY WITH LITHOLAPAXY N/A 05/07/2016   Procedure: CYSTOSCOPY WITH LITHOLAPAXY;  Surgeon: Rana Snare, MD;  Location[facility];  Service: Urology;  Laterality: N/A;  . CYSTOSCOPY WITH RETROGRADE PYELOGRAM, URETEROSCOPY AND STENT PLACEMENT  09/03/2018   Procedure: CYSTOSCOPY WITH RETROGRADE PYELOGRAM, URETEROSCOPY LASER LITHOTRIPSY AND STENT PLACEMENT;  Surgeon: Lucas Mallow, MD;  Location: [facility];  Service: Urology;;  . HOLMIUM LASER APPLICATION N/A 44/31/5400   Procedure: HOLMIUM LASER APPLICATION;  Surgeon: Rana Snare, MD;  Location: [facility];  Service: Urology;  Laterality: N/A;  . INGUINAL HERNIA REPAIR Left 03-30-2005  dr Excell Seltzer   incarcerated   . RADIOACTIVE PROSTATE SEED IMPLANTS  1997  . TRANSTHORACIC ECHOCARDIOGRAM  08/24/2014   mid  anteroseptal and distal septak hypokinetic,  mild focal basal LVH, ef 60-65%,  grade 1 diastolic dysfunction/  mild MR/  mild LAE/  mild dilated RV with normal RVSP/  trivial TR        Home Medications    Prior to Admission medications   Medication Sig Start Date End Date Taking? Authorizing Provider  acetaminophen (TYLENOL) 500 MG tablet Take 1,000 mg by mouth every 6 (six) hours as needed for mild pain (for pain).    Yes [provider]  amiodarone (PACERONE) 200 MG tablet Take 1 tablet (200 mg total) by mouth daily. 07/12/18  Yes Dunn, Areta Haber, PA-C  apixaban (ELIQUIS) 5 MG TABS tablet Take 1 tablet (5 mg total) by mouth 2 (two) times daily. 09/29/18  Yes Dunn, Areta Haber, PA-C  omeprazole (PRILOSEC) 40 MG capsule Take 1 capsule (40 mg total)  by mouth daily. 06/18/18  Yes [name], [name], MD  rosuvastatin (CRESTOR) 40 MG tablet Take 1 tablet (40 mg total) by mouth daily. 06/18/18  Yes [name], [name], MD  senna-docusate (SENOKOT-S) 8.6-50 MG tablet Take 1 tablet by mouth 2 (two) times daily. Patient taking differently: Take 1 tablet by mouth 2 (two) times daily as needed for mild constipation.  08/09/18  Yes Eugenie Filler, MD  tamsulosin (FLOMAX) 0.4 MG CAPS capsule Take 1 capsule (0.4 mg total) by mouth daily after supper. 01/06/18  Yes [name], MD  HYDROcodone-acetaminophen (NORCO/VICODIN) 5-325 MG tablet Take 1 tablet by mouth every 4 (four) hours as needed for moderate pain. Patient not taking: Reported on 10/05/2018 09/03/18 09/03/19  Marton Redwood III, MD  hydrocortisone cream 1 % Apply topically 4 (four) times daily. Patient not taking: Reported on 10/05/2018 08/09/18   Eugenie Filler, MD  Menthol-Methyl Salicylate (MUSCLE RUB) 10-15 % CREA Apply 1 application topically as needed for muscle pain (lower back pain). Patient not taking: Reported on 10/05/2018 08/09/18   Eugenie Filler, MD  opium-belladonna (B&O SUPPRETTES) 16.2-60 MG suppository Place 1  suppository rectally every 6 (six) hours as needed for bladder spasms. Patient not taking: Reported on 08/26/2018 08/09/18   Eugenie Filler, MD  polyethylene glycol [facility] / Floria Raveling) packet Take 17 g by mouth daily. Patient not taking: Reported on 08/26/2018 08/10/18   Eugenie Filler, MD    Family History Family History  Problem Relation Age of Onset  . Heart disease Mother   . Cancer Mother   . Cancer Father   . Cancer Brother     Social History Social History   Tobacco Use  . Smoking status: Former Smoker    Years: 2.00    Types: Cigarettes    Last attempt to quit: 07/28/1960    Years since quitting: 58.2  . Smokeless tobacco: Never Used  Substance Use Topics  . Alcohol use: No  . Drug use: No     Allergies   Ciprofloxacin   Review of Systems Review of Systems All other systems reviewed and are negative except that which was mentioned in HPI   Physical Exam Updated Vital Signs BP 138/79   Pulse 71   Temp 98.3 F (36.8 C) (Oral)   Resp 18   SpO2 96%   Physical Exam Vitals signs and nursing note reviewed.  Constitutional:      General: He is not in acute distress.    Appearance: He is well-developed.     Comments: Chronically ill-appearing, awake and alert  HENT:     Head: Normocephalic and atraumatic.     Nose: Nose normal.     Mouth/Throat:     Mouth: Mucous membranes are moist.  Eyes:     Conjunctiva/sclera: Conjunctivae normal.  Neck:     Musculoskeletal: Neck supple.  Cardiovascular:     Rate and Rhythm: Normal rate and regular rhythm.     Heart sounds: Normal heart sounds. No murmur.  Pulmonary:     Effort: Pulmonary effort is normal.     Breath sounds: Normal breath sounds.  Chest:     Chest wall: No tenderness.  Abdominal:     General: Bowel sounds are normal. There is no distension.     Palpations: Abdomen is soft.     Tenderness: There is no abdominal tenderness.  Musculoskeletal:        General: No tenderness or deformity.      Comments:  No obvious deformity of right hip and no focal areas of tenderness on right hip or thigh  Skin:    General: Skin is warm and dry.     Comments: Skin tear right elbow, left forearm, left upper buttock near sacrum PICC in R upper arm  Neurological:     Mental Status: He is alert and oriented to person, place, and time.     Sensory: No sensory deficit.     Comments: Fluent speech  Psychiatric:        Judgment: Judgment normal.      ED Treatments / Results  Labs (all labs ordered are listed, but only abnormal results are displayed) Labs Reviewed  CBC WITH DIFFERENTIAL/PLATELET - Abnormal; Notable for the following components:      Result Value   RBC 3.25 (*)    Hemoglobin 10.5 (*)    HCT 33.0 (*)    MCV 101.5 (*)    Platelets 143 (*)    Lymphs Abs 0.6 (*)    All other components within normal limits  COMPREHENSIVE METABOLIC PANEL - Abnormal; Notable for the following components:   Glucose, Bld 114 (*)    Calcium 8.2 (*)    Total Protein 5.7 (*)    Albumin 3.0 (*)    All other components within normal limits    EKG None  Radiology Dg Chest 2 View  Result Date: 10/05/2018 CLINICAL DATA:  Recent fall with right hip pain, initial encounter EXAM: CHEST - 2 VIEW COMPARISON:  01/04/2018 FINDINGS: Right-sided PICC line is noted at the cavoatrial junction. Postsurgical changes are again seen. Cardiac shadow is not significantly enlarged. The lungs are well aerated bilaterally. Sclerotic focus is noted within the inferior aspect of the scapula on the left stable from previous exam. No focal infiltrate or sizable effusion is seen. IMPRESSION: No acute abnormality noted. Electronically Signed   By: Inez Catalina M.D.   On: 10/05/2018 10:49   Ct Head Wo Contrast  Result Date: 10/05/2018 CLINICAL DATA:  Several recent falls EXAM: CT HEAD WITHOUT CONTRAST TECHNIQUE: Contiguous axial images were obtained from the base of the skull through the vertex without intravenous  contrast. COMPARISON:  None. FINDINGS: Brain: There is mild diffuse atrophy. There is somewhat greater frontal atrophy, particular on the right, than elsewhere. There is an extra-axial fluid collection in the mid to posterior left frontal region which is of similar attenuation of the brain parenchyma measuring 1.0 cm. This area is localized and has an appearance suggesting epidural fluid. It causes slight localized mass effect on the left frontal lobe, but no midline shift. No other extra-axial fluid is identified beyond fluid felt to be associated with atrophy in the frontal lobes. There is no intra-axial mass or hemorrhage. There is patchy small vessel disease in the centra semiovale bilaterally. No acute appearing infarct is evident. Vascular: No hyperdense vessel. There is calcification in each carotid siphon region. Skull: The bony calvarium appears intact. Sinuses/Orbits: There is opacification in portions of the left anterior ethmoid and inferior left frontal sinus regions. Other visualized paranasal sinuses are clear. Orbits appear symmetric bilaterally. Other: Visualized mastoid air cells are clear. IMPRESSION: Probable subacute focal epidural hematoma left frontal region with maximum thickness of 1.0 cm. It causes minimal mass effect upon the adjacent left frontal lobe but no midline shift. No other extra-axial fluid identified. Note that there is moderate frontal atrophy bilaterally, slightly more severe on the right than on the left. There is patchy periventricular small vessel disease.  No acute infarct evident. No intra-axial hemorrhage or mass. There are foci of arterial vascular calcification. There is ethmoid and frontal sinus disease on the left side. Critical Value/emergent results were called by telephone at the time of interpretation on 10/05/2018 at 11:18 am to Dr. Theotis Burrow , who verbally acknowledged these results. Electronically Signed   By: Lowella Grip III M.D.   On: 10/05/2018  11:19   Ct Hip Right Wo Contrast  Result Date: 10/05/2018 CLINICAL DATA:  Right hip pain. Patient fell last night and again today. EXAM: CT OF THE RIGHT HIP WITHOUT CONTRAST TECHNIQUE: Multidetector CT imaging of the right hip was performed according to the standard protocol. Multiplanar CT image reconstructions were also generated. COMPARISON:  Radiographs 10/05/2018.  Pelvic CT 08/16/2018. FINDINGS: Bones/Joint/Cartilage The bones are demineralized. There is an acute fracture of the mid right femoral neck which is mildly impacted laterally. There is no significant displacement. This fracture demonstrates no intertrochanteric extension or involvement of the femoral head articular surface. There are stable underlying mild right hip degenerative changes. No evidence of acute fracture of the right hemipelvis. There is no significant hip joint effusion. Sclerotic lesion in the right superior acetabulum is stable, consistent with a bone island. Ligaments Suboptimally assessed by CT. Muscles and Tendons The visualized pelvic and right hip muscles appear unremarkable. Soft tissues Minimal subcutaneous edema lateral to the greater trochanter. No significant soft tissue hematoma. Prostate brachytherapy seeds are noted. Small right-sided bladder calculi are noted without evidence of distal ureteral dilatation. IMPRESSION: 1. Mildly impacted acute fracture of the mid right femoral neck. This demonstrates no pathologic features. 2. No significant associated hematoma or joint effusion. 3. Mild underlying right hip degenerative changes. 4. Persistent right-sided bladder calculi. Electronically Signed   By: Richardean Sale M.D.   On: 10/05/2018 13:27   Dg Hip Unilat  With Pelvis 2-3 Views Right  Result Date: 10/05/2018 CLINICAL DATA:  Recent fall with right-sided hip pain, initial encounter EXAM: DG HIP (WITH OR WITHOUT PELVIS) 2-3V RIGHT COMPARISON:  None. FINDINGS: Pelvic ring is intact. There is a lucency identified in  the right femoral neck with some impaction identified consistent with mildly displaced fracture. CT would be confirmatory. Prostate brachytherapy seeds are noted. Chronic calcification is noted within the proximal left femur. Degenerative changes of lumbar spine are noted. IMPRESSION: Lucency in the right femoral neck with suggestion of mild impaction. CT would be helpful for confirmation. Electronically Signed   By: Inez Catalina M.D.   On: 10/05/2018 10:52    Procedures .Critical Care Performed by: Sharlett Iles, MD Authorized by: Sharlett Iles, MD   Critical care provider statement:    Critical care time (minutes):  30   Critical care time was exclusive of:  Separately billable procedures and treating other patients   Critical care was necessary to treat or prevent imminent or life-threatening deterioration of the following conditions:  Trauma and CNS failure or compromise   Critical care was time spent personally by me on the following activities:  Development of treatment plan with patient or surrogate, discussions with consultants, evaluation of patient's response to treatment, examination of patient, obtaining history from patient or surrogate, ordering and performing treatments and interventions, ordering and review of laboratory studies, ordering and review of radiographic studies, re-evaluation of patient's condition and review of old charts   (including critical care time)  Medications Ordered in ED Medications  bacitracin ointment 1 application (1 application Topical Given 10/05/18 1047)  HYDROcodone-acetaminophen (NORCO/VICODIN)  5-325 MG per tablet 2 tablet (2 tablets Oral Given 10/05/18 1140)     Initial Impression / Assessment and Plan / ED Course  I have reviewed the triage vital signs and the nursing notes.  Pertinent labs and imaging results that were available during my care of the patient were reviewed by me and considered in my medical decision making (see  chart for details).        VSS, neurovascularly intact distally.  X-ray of hip unclear, CT shows right femoral neck fracture.  Head CT shows subacute frontal epidural hematoma with no midline shift.  Discussed findings with radiologist and neurosurgeon on-call, Dr. Trenton Gammon, who has advised no acute intervention needed, repeat head CT in 24 hours.  Discussed orthopedic injury with Dr. Mardelle Matte, who will see the patient in consultation.  Basic lab work reassuring.  Discussed admission with [facility], Dr. Benny Lennert, and pt admitted for further care.  Final Clinical Impressions(s) / ED Diagnoses   Final diagnoses:  Closed fracture of neck of right femur, initial encounter [facility])  Traumatic epidural hematoma without loss of consciousness, initial encounter [facility])    ED Discharge Orders    None       Jeananne Bedwell, Wenda Overland, MD 10/05/18 1547

## 2018-10-05 NOTE — Discharge Planning (Signed)
EDCM consulted in regards to medication assistance.  Pt has insurance coverage and is not eligible for MATCH program.  No further CM needs communicated at this time.  

## 2018-10-05 NOTE — Telephone Encounter (Signed)
Daughter, Athel Merriweather called in for her father who was there with her.   She put him on the phone at my request for triage. He fell this morning after feeling dizzy.   He also fell yesterday outside after having a dizzy spell.   When he fell this morning he fell onto a marble table and broke it.  He is c/o pain in his right leg in the groin area.   He can walk but he is limping on his right leg.    [name] and the pt were answering my questions. He is on Eliquis.   His dose was increased Wednesday.   He has a PICC line in getting antibiotics for a bacterial infection in his urine. With his risk factors of dizziness, increased Eliquis, falling and receiving antibiotics for an infection I instructed them to go to the ED.   [name] agreed that he should go.   The pt finally agreed to go after talking with me and why he needed to go.   They are going to [facility] ED because that is where he has been getting his treatment.    I let them know that was fine.  [name] is going to call her son to come help her take him to the ED now.  I have sent these triage notes to Dr. [name] to make him aware.    Reason for Disposition . [1] SEVERE weakness (i.e., unable to walk or barely able to walk, requires support) AND     [2] new onset or worsening  Answer Assessment - Initial Assessment Questions 1. DESCRIPTION: "Describe how you are feeling."     Daughter calling in [place] in, she put pt on the phone. I slipped and fell.   I fell this morning and yesterday fell outside.   I use a walker. 2. SEVERITY: "How bad is it?"  "Can you stand and walk?"   - MILD - Feels weak or tired, but does not interfere with work, school or normal activities   - [place] to stand and walk; weakness interferes with work, school, or normal activities   - SEVERE - Unable to stand or walk     I hurt my right leg and its hurting in my groin area.   I can walk but I'm limping.   I felt dizzy  earlier.   The dizziness made me loose my balance,   He fell  On same side as yesterday.   He skinned and bruised his right elbow yesterday. 3. ONSET:  "When did the weakness begin?"     Yesterday I fell about 2-3:00 PM 4. CAUSE: "What do you think is causing the weakness?"     I believe it's the Eloquis.   He has been on it.    5. MEDICINES: "Have you recently started a new medicine or had a change in the amount of a medicine?"     Eloquis   He was taking Eloquis and he fell when on 2.5 mg in the past and he got dizzy and fell in the bathroom.    Wednesday they upped him to 5 mg in the morning and 5 mg in the evening.     6. OTHER SYMPTOMS: "Do you have any other symptoms?" (e.g., chest pain, fever, cough, SOB, vomiting, diarrhea, bleeding, other areas of pain)     He has 2 sores inside the cheeks of his buttocks.   He sits a lot.  Daughter said she heard him fall this morning.   He hit the marble table and broke it this morning.     He has a PICC line getting antibiotics.   For bacteria infection in his urine.   [name] is going to get her son to help take him to [facility] ED now.    He is feeling fine now and not dizzy so they feel ok with taking him to the ED and not calling EMS. 7. PREGNANCY: "Is there any chance you are pregnant?" "When was your last menstrual period?"     N/A  Protocols used: WEAKNESS (GENERALIZED) AND FATIGUE-A-AH

## 2018-10-05 NOTE — ED Notes (Signed)
Patient transported to X-ray 

## 2018-10-05 NOTE — Progress Notes (Signed)
I was called about an 83 year old male status post numerous falls of late.  No recent falls with blow to the head.  Situation complicated by anticoagulation.  CT scan of head demonstrates a small area of subacute either epidural hematoma or loculated subdural hematoma.  No mass-effect.  No evidence of acute bleeding.  From my standpoint this only requires observation.  He should have a follow-up head CT scan in the morning.  If unchanged she needs a then he needs no further follow-up.  Call me as needed.,

## 2018-10-05 NOTE — Consult Note (Signed)
ORTHOPAEDIC CONSULTATION  REQUESTING PHYSICIAN: Little, Wenda Overland, MD  Chief Complaint: Right hip pain  HPI: Carl [name] is a 83 y.o. male who complains of acute right hip pain after mechanical fall.  Pain is rated as moderate to severe with movement, better with rest and pain medication.  Located around the right groin.  Unable to ambulate.  He has had multiple falls in the last couple of days.  He has multiple comorbidities as listed below.  Past Medical History:  Diagnosis Date  . AAA (abdominal aortic aneurysm) ([place])    CT abd. dated 06-18-2018 in epic,  3.1cm;  followed by pcp  . Arthritis    arms  . Bladder calculus   . Chronic anemia   . Chronic anxiety   . CKD (chronic kidney disease), stage III ([place])   . Coronary artery disease CARDIOLOGIST-  DR Rockey Situ   a. CABG 2006 with  LIMA-LAD, SVG-Ramus, SVG-OM, seq SVG-PLV-PDA.  . Diastolic CHF ([place])    followed by cardiology  . DOE (dyspnea on exertion)   . GERD (gastroesophageal reflux disease)   . Hematuria   . History of bladder stone   . History of non-ST elevation myocardial infarction (NSTEMI) 03/30/2005   post op recovery inguinal hernia repair  . History of recurrent UTIs    last admission 08-04-2018 pseudomonas UTI  . Hyperlipidemia   . Hyperplasia of prostate without lower urinary tract symptoms (LUTS)   . Hypertension   . Ischemic cardiomyopathy    echo (01/13) 45-50% septal akinesis, mild MR/  echo  (01/16) ef 60-65%, mid-apical anteroseptal hypokinesis;   echo 09-16-2016 ef 50-55%  . Left ureteral stone   . Lung nodule    noted 08/ 2018;  followed by pcp  . Moderate mitral regurgitation   . Orthostatic hypotension   . PAC (premature atrial contraction)   . Paroxysmal atrial flutter ([place])    a. dx 08/2016, started on amiodarone.;  08/ 2018 AFlutter  . Persistent atrial fibrillation followed by cardiologist-- dr Rockey Situ   dx AFib 02/ 2018  s/p  DCCV 04/ 2018;  08/ 2018  AFlutter w/ RVR in setting  UTI  . Pulmonary hypertension ([place])    last echo in epic 09-16-2016  . Recurrent prostate cancer ([place]) UROLOGIST-- DR BELL   s/p  radioactive prostate seed implants in 1997;    due to PSA rising pt has been doing lupron injection's few past several yrs at dr bell office  . Renal calculi    bilateral per CT 06-18-2018  . S/P CABG x 5 04/04/2005   LIMA to LAD,  SVG to Ramus,  SVG to OM,  seqSVG to PLV and PDA  . Wears glasses    Past Surgical History:  Procedure Laterality Date  . CARDIAC CATHETERIZATION  04/02/2005   dr Doreatha Lew    Critical three-vessel coronary disease --  Essentially normal left ventricular function , ef 55%  . CARDIOVASCULAR STRESS TEST  05-01-2008  dr Doreatha Lew /  dr harding   Low risk nuclear study w/ no ischemia or scar but flattened septal motion/  ef 64%  . CARDIOVERSION N/A 11/19/2016   Procedure: CARDIOVERSION;  Surgeon: Jerline Pain, MD;  Location: [facility];  Service: Cardiovascular;  Laterality: N/A;  . COLONOSCOPY N/A 10/23/2016   Procedure: COLONOSCOPY;  Surgeon: Milus Banister, MD;  Location: [facility];  Service: Endoscopy;  Laterality: N/A;  . CORONARY ARTERY BYPASS GRAFT  04/04/2005   dr  Ricard Dillon   LIMA -  LAD,  SVG - Ramus, SVG - OM,  seqSVG - PLV and PDA  . CYSTOSCOPY WITH LITHOLAPAXY N/A 05/07/2016   Procedure: CYSTOSCOPY WITH LITHOLAPAXY;  Surgeon: Rana Snare, MD;  Location: [facility];  Service: Urology;  Laterality: N/A;  . CYSTOSCOPY WITH RETROGRADE PYELOGRAM, URETEROSCOPY AND STENT PLACEMENT  09/03/2018   Procedure: CYSTOSCOPY WITH RETROGRADE PYELOGRAM, URETEROSCOPY LASER LITHOTRIPSY AND STENT PLACEMENT;  Surgeon: Lucas Mallow, MD;  Location: [facility];  Service: Urology;;  . HOLMIUM LASER APPLICATION N/A 43/[id number]/[id number]   Procedure: HOLMIUM LASER APPLICATION;  Surgeon: Rana Snare, MD;  [facility];  Service: Urology;  Laterality: N/A;  . INGUINAL HERNIA REPAIR Left 03-30-2005  dr Excell Seltzer   incarcerated    . RADIOACTIVE PROSTATE SEED IMPLANTS  1997  . TRANSTHORACIC ECHOCARDIOGRAM  08/24/2014   mid anteroseptal and distal septak hypokinetic,  mild focal basal LVH, ef 60-65%,  grade 1 diastolic dysfunction/  mild MR/  mild LAE/  mild dilated RV with normal RVSP/  trivial TR   Social History   Socioeconomic History  . Marital status: Widowed    Spouse name: Not on file  . Number of children: 3  . Years of education: Not on file  . Highest education level: Not on file  Occupational History  . Occupation: retired  Scientific laboratory technician  . Financial resource strain: Not on file  . Food insecurity:    Worry: Not on file    Inability: Not on file  . Transportation needs:    Medical: Not on file    Non-medical: Not on file  Tobacco Use  . Smoking status: Former Smoker    Years: 2.00    Types: Cigarettes    Last attempt to quit: 07/28/1960    Years since quitting: 58.2  . Smokeless tobacco: Never Used  Substance and Sexual Activity  . Alcohol use: No  . Drug use: No  . Sexual activity: Never  Lifestyle  . Physical activity:    Days per week: 0 days    Minutes per session: Not on file  . Stress: Only a little  Relationships  . Social connections:    Talks on phone: Not on file    Gets together: Not on file    Attends religious service: Not on file    Active member of club or organization: Not on file    Attends meetings of clubs or organizations: Not on file    Relationship status: Not on file  Other Topics Concern  . Not on file  Social History Narrative   Widowed lives with daughter [name]   Family History  Problem Relation Age of Onset  . Heart disease Mother   . Cancer Mother   . Cancer Father   . Cancer Brother    Allergies  Allergen Reactions  . Ciprofloxacin Other (See Comments)    PER HEART DR     Positive ROS: All other systems have been reviewed and were otherwise negative with the exception of those mentioned in the HPI and as above.  Physical Exam: General:  Alert, no acute distress Cardiovascular: No pedal edema Respiratory: Mild cyanosis, no use of accessory musculature GI: No organomegaly, abdomen is soft and non-tender Skin: No lesions in the area of chief complaint Neurologic: Sensation intact distally Psychiatric: Patient is competent for consent with normal mood and affect Lymphatic: No axillary or cervical lymphadenopathy  MUSCULOSKELETAL: Right hip has positive logroll, EHL and FHL are intact.  No pain to  palpation on either upper extremities or to his left lower extremity.  Assessment: Right valgus impacted femoral neck fracture with multiple medical comorbidities.    Plan: This is an acute severe injury, and carries high risk both for morbidity as well as mortality.  I have discussed with the family and the patient his options, percutaneous pinning is probably the lowest surgical risk although does carry the risks for nonunion, loss of fixation, and the potential need for future conversion to hemiarthroplasty.  Having said that, I have recommended percutaneous pinning in order to minimize the medical complications, and hopefully be able to achieve union at the femoral neck, and restore ambulatory function.  He is going to be evaluated medically, optimized, and plan for surgery possibly tomorrow.  I have discussed this with Dr. Griffin Basil who is likely to be the operating surgeon, timing depending.  I have also discussed all this with the patient and family.   The risks benefits and alternatives were discussed with the patient including but not limited to the risks of nonoperative treatment, versus surgical intervention including infection, bleeding, nerve injury, malunion, nonunion, the need for revision surgery, hardware prominence, hardware failure, the need for hardware removal, blood clots, cardiopulmonary complications, morbidity, mortality, AVN, conversion to hemiarthroplasty, among others, and they were willing to proceed.        Johnny Bridge, MD Cell [phone]   10/05/2018 5:41 PM

## 2018-10-05 NOTE — H&P (Signed)
Carl [name] is an 83 y.o. male.   Chief Complaint: Left hip pain HPI: The patient is a 83 yr old man who carries a past medical history significant for AAA, arthritis, bladder calculus, Epidural hemorrhage, chronic anemia, anxiety, CKD Stage III, CAD s/p CABG in 2006, DOE, GERD, Hematuria, Recurrent UTI's, Lung nodule that is followed by his PCP, Diastolic CHF, Hyperlipidemia, Hypertension, BPH, paroxysmal atrial fibrillation for which the patient is on eliquis, renal calculi, and recurrent prostate cancer.  The patient states that he has felt weaker than usual lately. He fell once last night and again this morning. He was able to get up on his own last night, but not this morning. He was brought to the ED by EMS. He was found to have a mildly impacted acute fracture of the mid right femoral neck.   Vital signs in the ED were: Temp: 98.3. Heart rate: 72, blood pressure was 131/66. He was saturating 97% on room air.  Sodium was 139. Potassium was 3.9. Choride was 110. BUN was 22. Creatinine was 0.99. WBC was 8.2 . Hemoglobin 10.5. Platelets was 143.   CT of his head demonstrates a small area of either epidural hematoma or loculated subdural hematoma without mass effect or signs of acute bleeding. The patient has been on Eliquis at home for his atrial fibrillation.  The hospitalists were consulted to admit the patient for further evaluation and plan. Neurosurgery and orthopedic surgery were consulted from the ED.  Past Medical History:  Diagnosis Date  . AAA (abdominal aortic aneurysm) ([place])    CT abd. dated 06-18-2018 in epic,  3.1cm;  followed by pcp  . Arthritis    arms  . Bladder calculus   . Chronic anemia   . Chronic anxiety   . CKD (chronic kidney disease), stage III ([place])   . Coronary artery disease CARDIOLOGIST-  DR Rockey Situ   a. CABG 2006 with  LIMA-LAD, SVG-Ramus, SVG-OM, seq SVG-PLV-PDA.  . Diastolic CHF ([place])    followed by cardiology  . DOE (dyspnea on exertion)   . GERD  (gastroesophageal reflux disease)   . Hematuria   . History of bladder stone   . History of non-ST elevation myocardial infarction (NSTEMI) 03/30/2005   post op recovery inguinal hernia repair  . History of recurrent UTIs    last admission 08-04-2018 pseudomonas UTI  . Hyperlipidemia   . Hyperplasia of prostate without lower urinary tract symptoms (LUTS)   . Hypertension   . Ischemic cardiomyopathy    echo (01/13) 45-50% septal akinesis, mild MR/  echo  (01/16) ef 60-65%, mid-apical anteroseptal hypokinesis;   echo 09-16-2016 ef 50-55%  . Left ureteral stone   . Lung nodule    noted 08/ 2018;  followed by pcp  . Moderate mitral regurgitation   . Orthostatic hypotension   . PAC (premature atrial contraction)   . Paroxysmal atrial flutter ([place])    a. dx 08/2016, started on amiodarone.;  08/ 2018 AFlutter  . Persistent atrial fibrillation followed by cardiologist-- dr Rockey Situ   dx AFib 02/ 2018  s/p  DCCV 04/ 2018;  08/ 2018  AFlutter w/ RVR in setting UTI  . Pulmonary hypertension ([place])    last echo in epic 09-16-2016  . Recurrent prostate cancer ([place]) UROLOGIST-- DR BELL   s/p  radioactive prostate seed implants in 1997;    due to PSA rising pt has been doing lupron injection's few past several yrs at dr bell office  . Renal calculi  bilateral per CT 06-18-2018  . S/P CABG x 5 04/04/2005   LIMA to LAD,  SVG to Ramus,  SVG to OM,  seqSVG to PLV and PDA  . Wears glasses     Past Surgical History:  Procedure Laterality Date  . CARDIAC CATHETERIZATION  04/02/2005   dr Doreatha Lew    Critical three-vessel coronary disease --  Essentially normal left ventricular function , ef 55%  . CARDIOVASCULAR STRESS TEST  05-01-2008  dr Doreatha Lew /  dr harding   Low risk nuclear study w/ no ischemia or scar but flattened septal motion/  ef 64%  . CARDIOVERSION N/A 11/19/2016   Procedure: CARDIOVERSION;  Surgeon: Jerline Pain, MD;  Location: [facility];  Service: Cardiovascular;  Laterality: N/A;   . COLONOSCOPY N/A 10/23/2016   Procedure: COLONOSCOPY;  Surgeon: Milus Banister, MD;  [facility];  Service: Endoscopy;  Laterality: N/A;  . CORONARY ARTERY BYPASS GRAFT  04/04/2005   dr  Ricard Dillon   LIMA - LAD,  SVG - Ramus, SVG - OM,  seqSVG - PLV and PDA  . CYSTOSCOPY WITH LITHOLAPAXY N/A 05/07/2016   Procedure: CYSTOSCOPY WITH LITHOLAPAXY;  Surgeon: Rana Snare, MD;  [facility];  Service: Urology;  Laterality: N/A;  . CYSTOSCOPY WITH RETROGRADE PYELOGRAM, URETEROSCOPY AND STENT PLACEMENT  09/03/2018   Procedure: CYSTOSCOPY WITH RETROGRADE PYELOGRAM, URETEROSCOPY LASER LITHOTRIPSY AND STENT PLACEMENT;  Surgeon: Lucas Mallow, MD;  [facility];  Service: Urology;;  . HOLMIUM LASER APPLICATION N/A 69/62/9528   Procedure: HOLMIUM LASER APPLICATION;  Surgeon: Rana Snare, MD;  [facility];  Service: Urology;  Laterality: N/A;  . INGUINAL HERNIA REPAIR Left 03-30-2005  dr Excell Seltzer   incarcerated   . RADIOACTIVE PROSTATE SEED IMPLANTS  1997  . TRANSTHORACIC ECHOCARDIOGRAM  08/24/2014   mid anteroseptal and distal septak hypokinetic,  mild focal basal LVH, ef 60-65%,  grade 1 diastolic dysfunction/  mild MR/  mild LAE/  mild dilated RV with normal RVSP/  trivial TR    Family History  Problem Relation Age of Onset  . Heart disease Mother   . Cancer Mother   . Cancer Father   . Cancer Brother    Social History:  reports that he quit smoking about 58 years ago. His smoking use included cigarettes. He quit after 2.00 years of use. He has never used smokeless tobacco. He reports that he does not drink alcohol or use drugs. (Not in a hospital admission)   Allergies:  Allergies  Allergen Reactions  . Ciprofloxacin Other (See Comments)    PER HEART DR    Pertinent items are noted in HPI.   General appearance: alert, cooperative, appears stated age and slowed mentation Head: Normocephalic, without obvious abnormality,  atraumatic Eyes: conjunctivae/corneas clear. PERRL, EOM's intact. Fundi benign. Throat: lips, mucosa, and tongue normal; teeth and gums normal Neck: no adenopathy, no carotid bruit, no JVD, supple, symmetrical, trachea midline and thyroid not enlarged, symmetric, no tenderness/mass/nodules Resp: No increased work of breathing. No wheezes, rales, or rhonchi. No tactile fremitus. Chest wall: no tenderness Cardio: regular rate and rhythm, S1, S2 normal, no murmur, click, rub or gallop GI: soft, non-tender; bowel sounds normal; no masses,  no organomegaly Extremities: extremities normal, atraumatic, no cyanosis or edema Pulses: 2+ and symmetric Skin: Skin color, texture, turgor normal. No rashes or lesions Lymph nodes: Cervical, supraclavicular, and axillary nodes normal. Neurologic: Grossly normal  Results for orders placed or performed during the hospital  encounter of 10/05/18 (from the past 48 hour(s))  CBC with Differential     Status: Abnormal   Collection Time: 10/05/18  1:50 PM  Result Value Ref Range   WBC 8.2 4.0 - 10.5 K/uL   RBC 3.25 (L) 4.22 - 5.81 MIL/uL   Hemoglobin 10.5 (L) 13.0 - 17.0 g/dL   HCT 33.0 (L) 39.0 - 52.0 %   MCV 101.5 (H) 80.0 - 100.0 fL   MCH 32.3 26.0 - 34.0 pg   MCHC 31.8 30.0 - 36.0 g/dL   RDW 13.8 11.5 - 15.5 %   Platelets 143 (L) 150 - 400 K/uL   nRBC 0.0 0.0 - 0.2 %   Neutrophils Relative % 87 %   Neutro Abs 7.1 1.7 - 7.7 K/uL   Lymphocytes Relative 7 %   Lymphs Abs 0.6 (L) 0.7 - 4.0 K/uL   Monocytes Relative 6 %   Monocytes Absolute 0.5 0.1 - 1.0 K/uL   Eosinophils Relative 0 %   Eosinophils Absolute 0.0 0.0 - 0.5 K/uL   Basophils Relative 0 %   Basophils Absolute 0.0 0.0 - 0.1 K/uL   Immature Granulocytes 0 %   Abs Immature Granulocytes 0.02 0.00 - 0.07 K/uL    Comment: Performed at [facility], [place] [address] [place], [place] [address]  Comprehensive metabolic panel     Status: Abnormal   Collection Time: 10/05/18  1:50  PM  Result Value Ref Range   Sodium 139 135 - 145 mmol/L   Potassium 3.9 3.5 - 5.1 mmol/L   Chloride 110 98 - 111 mmol/L   CO2 24 22 - 32 mmol/L   Glucose, Bld 114 (H) 70 - 99 mg/dL   BUN 22 8 - 23 mg/dL   Creatinine, Ser 0.99 0.61 - 1.24 mg/dL   Calcium 8.2 (L) 8.9 - 10.3 mg/dL   Total Protein 5.7 (L) 6.5 - 8.1 g/dL   Albumin 3.0 (L) 3.5 - 5.0 g/dL   AST 20 15 - 41 U/L   ALT 25 0 - 44 U/L   Alkaline Phosphatase 66 38 - 126 U/L   Total Bilirubin 0.5 0.3 - 1.2 mg/dL   GFR calc non Af Amer >60 >60 mL/min   GFR calc Af Amer >60 >60 mL/min   Anion gap 5 5 - 15    Comment: Performed at [facility], [place] [address], [place], [place] [address]   [email or web address] @  Blood pressure 134/75, pulse 72, temperature 98.3 F (36.8 C), temperature source Oral, resp. rate 17, SpO2 92 %.    Assessment/Plan  Problem  Hip Fracture (Hcc)  Intracranial Hematoma (Hcc)  Physical Deconditioning  Fall  Persistent atrial fibrillation  Acute Congestive Heart Failure (Hcc)  Cardiomyopathy, Ischemic  Chronic kidney disease (CKD), stage III (moderate) (HCC)  Essential Hypertension  Hx of CABG x 5 '06    Left hip fracture: The patient is at increased risk of perioperative mobidity and mortality due to both his advanced age and multiple comorbidities. The risk to the patient's well being and mortality, however, would be much greater should he be left without restoring his mobility through repair of his hip.  The patient's Perioperative Cardiac risk as calculated by the Revised Cardiac Risk Index for Pre-Operative Risk is a Class IV which equates to a 15% 30-day risk of death, ME, or cardiac risk. I have ordered an EKG, echocardiogram and BNP to help elucidate. These things are still pending.  Intracranial hematoma: The patient has been evaluated  by neurosurgery. Dr. [name] Stable recommends a follow up CT head in the morning. If it is unchanged he needs no further follow-up. I have stopped the  patient's eliquis pending this follow up CT.  Persistent atrial fibrillation: EKG is pending. He appears to be in sinus rhythm at this point. He will be continued on amiodarone as at home and will be monitored on telemetry.  Systolic Congestive Heart Failure/Ischemic cardiomyopathy: The patient's most recent echocardiogram was in 2018 and revealed an EF of 50%. There was no evaluation for diastolic function. There was a severely dilated left atrial appendage. This echocardiogram will be repeated. The patient is not on ACE I due his elevated creatinine in the past.   CKD III: The patient has a GFR of 60 at this time, although his creatinine has been elevated in the past. Nephrotoxic medications will be avoided. No NSAIDS for pain control. Creatinine, volume status and electrolytes will be monitored.  I have seen and examined this patient myself. I have spent 78 minutes in this patient's evaluation and care.  Carl [name] 10/05/2018, 6:18 PM

## 2018-10-05 NOTE — ED Triage Notes (Addendum)
Pt presents from home after having a fall last night after slipping on a bath mat and a fall today after carrying wood inside. Pt has a skin tear one L arm and R hip pain. No deformity noted. No LOC. No head trauma. Alert and oriented.

## 2018-10-05 NOTE — ED Notes (Signed)
Bed: XM14 Expected date:  Expected time:  Means of arrival:  Comments: EMS: Fall, hip pain

## 2018-10-05 NOTE — Telephone Encounter (Signed)
Noted.  Patient is in the ED.

## 2018-10-05 NOTE — ED Notes (Signed)
Report given to Amy RN.

## 2018-10-05 NOTE — ED Notes (Signed)
Patient transported to CT 

## 2018-10-06 ENCOUNTER — Inpatient Hospital Stay (HOSPITAL_COMMUNITY): Payer: Medicare Other

## 2018-10-06 DIAGNOSIS — S72001A Fracture of unspecified part of neck of right femur, initial encounter for closed fracture: Secondary | ICD-10-CM

## 2018-10-06 LAB — MRSA PCR SCREENING: MRSA by PCR: NEGATIVE

## 2018-10-06 MED ORDER — SODIUM CHLORIDE 0.9 % IV SOLN
INTRAVENOUS | Status: DC
  Administered 2018-10-06 – 2018-10-08 (×3): via INTRAVENOUS

## 2018-10-06 MED ORDER — POVIDONE-IODINE 10 % EX SWAB: 2.0000 "application " | Freq: Once | CUTANEOUS | Status: DC

## 2018-10-06 MED ORDER — CHLORHEXIDINE GLUCONATE 4 % EX LIQD: 60.0000 mL | Freq: Once | CUTANEOUS | Status: DC

## 2018-10-06 MED ORDER — TRANEXAMIC ACID-NACL 1000-0.7 MG/100ML-% IV SOLN
1000.0000 mg | INTRAVENOUS | Status: AC
  Administered 2018-10-07: 1000 mg via INTRAVENOUS
  Filled 2018-10-06: qty 100

## 2018-10-06 MED ORDER — CEFAZOLIN SODIUM-DEXTROSE 2-4 GM/100ML-% IV SOLN
2.0000 g | INTRAVENOUS | Status: AC
  Administered 2018-10-07: 2 g via INTRAVENOUS
  Filled 2018-10-06: qty 100

## 2018-10-06 MED ORDER — ENSURE ENLIVE PO LIQD
237.0000 mL | Freq: Two times a day (BID) | ORAL | Status: DC
  Administered 2018-10-06 – 2018-10-11 (×7): 237 mL via ORAL

## 2018-10-06 NOTE — Progress Notes (Signed)
PROGRESS NOTE    Carl [name]  RCV:[id number] DOB: [birth year] DOA: 10/05/2018 PCP: [name], MD    Brief Narrative:  83 yr old man who carries a past medical history significant for AAA, arthritis, bladder calculus, Epidural hemorrhage, chronic anemia, anxiety, CKD Stage III, CAD s/p CABG in 2006, DOE, GERD, Hematuria, Recurrent UTI's, Lung nodule that is followed by his PCP, Diastolic CHF, Hyperlipidemia, Hypertension, BPH, paroxysmal atrial fibrillation for which the patient is on eliquis, renal calculi, and recurrent prostate cancer.  The patient states that he has felt weaker than usual lately. He fell once last night and again this morning. He was able to get up on his own last night, but not this morning. He was brought to the ED by EMS. He was found to have a mildly impacted acute fracture of the mid right femoral neck.   Vital signs in the ED were: Temp: 98.3. Heart rate: 72, blood pressure was 131/66. He was saturating 97% on room air.  Sodium was 139. Potassium was 3.9. Choride was 110. BUN was 22. Creatinine was 0.99. WBC was 8.2 . Hemoglobin 10.5. Platelets was 143.   CT of his head demonstrates a small area of either epidural hematoma or loculated subdural hematoma without mass effect or signs of acute bleeding. The patient has been on Eliquis at home for his atrial fibrillation.  The hospitalists were consulted to admit the patient for further evaluation and plan. Neurosurgery and orthopedic surgery were consulted from the ED.   Assessment & Plan:   Active Problems:   Hx of CABG x 5 '06   Essential hypertension   Cardiomyopathy, ischemic   Chronic kidney disease (CKD), stage III (moderate) (HCC)   Persistent atrial fibrillation   Acute congestive heart failure ([place])   Fall   Physical deconditioning   Hip fracture (HCC)   Intracranial hematoma (HCC)   Left hip fracture:  -Orthopedic surgery consulted. Planning surgery 3/12 as pt was on eliquis  prior to admit -Seems stable at present.  Intracranial hematoma:  -The patient has been evaluated by neurosurgery, who recommended repeat CT head today for interval change, now ordered and pending -Neurologically intact -Follow up on CT results  Persistent atrial fibrillation: -Continue on home amiodarone -Continued on tele  Chronic Systolic Congestive Heart Failure/Ischemic cardiomyopathy:  -The patient's most recent echocardiogram was in 2018 and revealed an EF of 50%. There was no evaluation for diastolic function. There was a severely dilated left atrial appendage.  -Currently not on ACE I due his elevated creatinine in the past.   CKD III:  -The patient presented with GFR of 60 -repeat bmet in AM  DVT prophylaxis: SCD's Code Status: Full Family Communication: Pt in room, family at bedside Disposition Plan: Uncertain at this time  Consultants:   Orthopedic surgery  Neurosurgery  Procedures:     Antimicrobials: Anti-infectives (From admission, onward)   Start     Dose/Rate Route Frequency Ordered Stop   10/06/18 0600  ceFAZolin (ANCEF) IVPB 2g/100 mL premix     2 g 200 mL/hr over 30 Minutes Intravenous On call to O.R. 10/05/18 2149 10/07/18 0559       Subjective: Lethargic this AM, more arousable later in day  Objective: Vitals:   10/06/18 0136 10/06/18 0440 10/06/18 0921 10/06/18 1335  BP: 133/73 140/72 124/66 120/60  Pulse: 69 69 67 83  Resp: 14 16 16 16   Temp: 97.8 F (36.6 C) (!) 97.5 F (36.4 C) 97.9 F (36.6 C) 98  F (36.7 C)  TempSrc: Oral Oral Oral Oral  SpO2: 97% 97% 97% 94%  Weight:      Height:        Intake/Output Summary (Last 24 hours) at 10/06/2018 1343 Last data filed at 10/06/2018 1153 Gross per 24 hour  Intake 480 ml  Output 600 ml  Net -120 ml   Filed Weights   10/05/18 1913  Weight: 74.1 kg    Examination:  General exam: Appears calm and comfortable  Respiratory system: Clear to auscultation. Respiratory effort  normal. Cardiovascular system: S1 & S2 heard, RRR Gastrointestinal system: Abdomen is nondistended, soft and nontender. No organomegaly or masses felt. Normal bowel sounds heard. Central nervous system: no tremors, No focal neurological deficits. Extremities: Symmetric 5 x 5 power. Skin: No rashes, lesions  Psychiatry: Difficult to assess given level of mentation  Data Reviewed: I have personally reviewed following labs and imaging studies  CBC: Recent Labs  Lab 10/03/18 1815 10/05/18 1350  WBC 5.4 8.2  NEUTROABS 3.9 7.1  HGB 10.8* 10.5*  HCT 33.8* 33.0*  MCV 98.5 101.5*  PLT 168 413*   Basic Metabolic Panel: Recent Labs  Lab 10/03/18 1815 10/05/18 1350  NA 140 139  K 3.7 3.9  CL 111 110  CO2 23 24  GLUCOSE 120* 114*  BUN 23 22  CREATININE 1.16 0.99  CALCIUM 8.0* 8.2*   GFR: Estimated Creatinine Clearance: 62.4 mL/min (by C-G formula based on SCr of 0.99 mg/dL). Liver Function Tests: Recent Labs  Lab 10/03/18 1815 10/05/18 1350  AST 20 20  ALT 18 25  ALKPHOS 64 66  BILITOT 0.5 0.5  PROT 5.5* 5.7*  ALBUMIN 2.9* 3.0*   No results for input(s): LIPASE, AMYLASE in the last 168 hours. No results for input(s): AMMONIA in the last 168 hours. Coagulation Profile: No results for input(s): INR, PROTIME in the last 168 hours. Cardiac Enzymes: No results for input(s): CKTOTAL, CKMB, CKMBINDEX, TROPONINI in the last 168 hours. BNP (last 3 results) No results for input(s): PROBNP in the last 8760 hours. HbA1C: No results for input(s): HGBA1C in the last 72 hours. CBG: No results for input(s): GLUCAP in the last 168 hours. Lipid Profile: No results for input(s): CHOL, HDL, LDLCALC, TRIG, CHOLHDL, LDLDIRECT in the last 72 hours. Thyroid Function Tests: No results for input(s): TSH, T4TOTAL, FREET4, T3FREE, THYROIDAB in the last 72 hours. Anemia Panel: No results for input(s): VITAMINB12, FOLATE, FERRITIN, TIBC, IRON, RETICCTPCT in the last 72 hours. Sepsis  Labs: No results for input(s): PROCALCITON, LATICACIDVEN in the last 168 hours.  Recent Results (from the past 240 hour(s))  MRSA PCR Screening     Status: None   Collection Time: 10/05/18 10:44 PM  Result Value Ref Range Status   MRSA by PCR NEGATIVE NEGATIVE Final    Comment:        The GeneXpert MRSA Assay (FDA approved for NASAL specimens only), is one component of a comprehensive MRSA colonization surveillance program. It is not intended to diagnose MRSA infection nor to guide or monitor treatment for MRSA infections. Performed at [facility], [place] [address], [place], [place] [address]      Radiology Studies: Dg Chest 2 View  Result Date: 10/05/2018 CLINICAL DATA:  Recent fall with right hip pain, initial encounter EXAM: CHEST - 2 VIEW COMPARISON:  01/04/2018 FINDINGS: Right-sided PICC line is noted at the cavoatrial junction. Postsurgical changes are again seen. Cardiac shadow is not significantly enlarged. The lungs are well aerated  bilaterally. Sclerotic focus is noted within the inferior aspect of the scapula on the left stable from previous exam. No focal infiltrate or sizable effusion is seen. IMPRESSION: No acute abnormality noted. Electronically Signed   By: Inez Catalina M.D.   On: 10/05/2018 10:49   Ct Head Wo Contrast  Result Date: 10/05/2018 CLINICAL DATA:  Several recent falls EXAM: CT HEAD WITHOUT CONTRAST TECHNIQUE: Contiguous axial images were obtained from the base of the skull through the vertex without intravenous contrast. COMPARISON:  None. FINDINGS: Brain: There is mild diffuse atrophy. There is somewhat greater frontal atrophy, particular on the right, than elsewhere. There is an extra-axial fluid collection in the mid to posterior left frontal region which is of similar attenuation of the brain parenchyma measuring 1.0 cm. This area is localized and has an appearance suggesting epidural fluid. It causes slight localized mass effect on the  left frontal lobe, but no midline shift. No other extra-axial fluid is identified beyond fluid felt to be associated with atrophy in the frontal lobes. There is no intra-axial mass or hemorrhage. There is patchy small vessel disease in the centra semiovale bilaterally. No acute appearing infarct is evident. Vascular: No hyperdense vessel. There is calcification in each carotid siphon region. Skull: The bony calvarium appears intact. Sinuses/Orbits: There is opacification in portions of the left anterior ethmoid and inferior left frontal sinus regions. Other visualized paranasal sinuses are clear. Orbits appear symmetric bilaterally. Other: Visualized mastoid air cells are clear. IMPRESSION: Probable subacute focal epidural hematoma left frontal region with maximum thickness of 1.0 cm. It causes minimal mass effect upon the adjacent left frontal lobe but no midline shift. No other extra-axial fluid identified. Note that there is moderate frontal atrophy bilaterally, slightly more severe on the right than on the left. There is patchy periventricular small vessel disease. No acute infarct evident. No intra-axial hemorrhage or mass. There are foci of arterial vascular calcification. There is ethmoid and frontal sinus disease on the left side. Critical Value/emergent results were called by telephone at the time of interpretation on 10/05/2018 at 11:18 am to Dr. Theotis Burrow , who verbally acknowledged these results. Electronically Signed   By: Lowella Grip III M.D.   On: 10/05/2018 11:19   Ct Hip Right Wo Contrast  Result Date: 10/05/2018 CLINICAL DATA:  Right hip pain. Patient fell last night and again today. EXAM: CT OF THE RIGHT HIP WITHOUT CONTRAST TECHNIQUE: Multidetector CT imaging of the right hip was performed according to the standard protocol. Multiplanar CT image reconstructions were also generated. COMPARISON:  Radiographs 10/05/2018.  Pelvic CT 08/16/2018. FINDINGS: Bones/Joint/Cartilage The bones  are demineralized. There is an acute fracture of the mid right femoral neck which is mildly impacted laterally. There is no significant displacement. This fracture demonstrates no intertrochanteric extension or involvement of the femoral head articular surface. There are stable underlying mild right hip degenerative changes. No evidence of acute fracture of the right hemipelvis. There is no significant hip joint effusion. Sclerotic lesion in the right superior acetabulum is stable, consistent with a bone island. Ligaments Suboptimally assessed by CT. Muscles and Tendons The visualized pelvic and right hip muscles appear unremarkable. Soft tissues Minimal subcutaneous edema lateral to the greater trochanter. No significant soft tissue hematoma. Prostate brachytherapy seeds are noted. Small right-sided bladder calculi are noted without evidence of distal ureteral dilatation. IMPRESSION: 1. Mildly impacted acute fracture of the mid right femoral neck. This demonstrates no pathologic features. 2. No significant associated hematoma or joint effusion.  3. Mild underlying right hip degenerative changes. 4. Persistent right-sided bladder calculi. Electronically Signed   By: Richardean Sale M.D.   On: 10/05/2018 13:27   Dg Hip Unilat  With Pelvis 2-3 Views Right  Result Date: 10/05/2018 CLINICAL DATA:  Recent fall with right-sided hip pain, initial encounter EXAM: DG HIP (WITH OR WITHOUT PELVIS) 2-3V RIGHT COMPARISON:  None. FINDINGS: Pelvic ring is intact. There is a lucency identified in the right femoral neck with some impaction identified consistent with mildly displaced fracture. CT would be confirmatory. Prostate brachytherapy seeds are noted. Chronic calcification is noted within the proximal left femur. Degenerative changes of lumbar spine are noted. IMPRESSION: Lucency in the right femoral neck with suggestion of mild impaction. CT would be helpful for confirmation. Electronically Signed   By: Inez Catalina M.D.    On: 10/05/2018 10:52    Scheduled Meds: . amiodarone  200 mg Oral Daily  . bacitracin  1 application Topical BID  . feeding supplement (ENSURE ENLIVE)  237 mL Oral BID BM  . pantoprazole  40 mg Oral Daily  . polyethylene glycol  17 g Oral Daily  . povidone-iodine  2 application Topical Once  . rosuvastatin  40 mg Oral Daily  . senna-docusate  1 tablet Oral BID  . tamsulosin  0.4 mg Oral QPC supper   Continuous Infusions: . sodium chloride 50 mL/hr at 10/06/18 1158  .  ceFAZolin (ANCEF) IV    . methocarbamol (ROBAXIN) IV       LOS: 1 day   Marylu Lund, MD [facility] Pager On Amion  If 7PM-7AM, please contact night-coverage 10/06/2018, 1:43 PM   \

## 2018-10-06 NOTE — Progress Notes (Signed)
ORTHOPAEDIC CONSULTATION  REQUESTING PHYSICIAN: [name], MD  PCP:  [name], MD  Chief Complaint: R hip fracture  HPI: Carl [name] is a 83 y.o. male who complains of right hip pain after a ground-level fall yesterday when he lost his balance.  Patient had right hip pain and inability to weight-bear.  He was brought to the hospital, where x-rays and a CT scan revealed a comminuted fracture of the right femoral neck with retroversion of the head.  CT scan of the head showed a subacute subdural hematoma, and neurosurgery consultation was obtained.  Patient takes Eliquis.  Last dose was yesterday morning.  His anticoagulants are currently being held.  He denies other injuries.  He is a Hydrographic surveyor with a cane.  He endorses several falls recently.  I was asked to assume care by Dr. Mardelle Matte / Griffin Basil.  Past Medical History:  Diagnosis Date  . AAA (abdominal aortic aneurysm) ([place])    CT abd. dated 06-18-2018 in epic,  3.1cm;  followed by pcp  . Arthritis    arms  . Bladder calculus   . Chronic anemia   . Chronic anxiety   . CKD (chronic kidney disease), stage III ([place])   . Coronary artery disease CARDIOLOGIST-  DR Rockey Situ   a. CABG 2006 with  LIMA-LAD, SVG-Ramus, SVG-OM, seq SVG-PLV-PDA.  . Diastolic CHF ([place])    followed by cardiology  . DOE (dyspnea on exertion)   . GERD (gastroesophageal reflux disease)   . Hematuria   . History of bladder stone   . History of non-ST elevation myocardial infarction (NSTEMI) 03/30/2005   post op recovery inguinal hernia repair  . History of recurrent UTIs    last admission 08-04-2018 pseudomonas UTI  . Hyperlipidemia   . Hyperplasia of prostate without lower urinary tract symptoms (LUTS)   . Hypertension   . Ischemic cardiomyopathy    echo (01/13) 45-50% septal akinesis, mild MR/  echo  (01/16) ef 60-65%, mid-apical anteroseptal hypokinesis;   echo 09-16-2016 ef 50-55%  . Left ureteral stone   . Lung nodule     noted 08/ 2018;  followed by pcp  . Moderate mitral regurgitation   . Orthostatic hypotension   . PAC (premature atrial contraction)   . Paroxysmal atrial flutter ([place])    a. dx 08/2016, started on amiodarone.;  08/ 2018 AFlutter  . Persistent atrial fibrillation followed by cardiologist-- dr Rockey Situ   dx AFib 02/ 2018  s/p  DCCV 04/ 2018;  08/ 2018  AFlutter w/ RVR in setting UTI  . Pulmonary hypertension ([place])    last echo in epic 09-16-2016  . Recurrent prostate cancer ([place]) UROLOGIST-- DR BELL   s/p  radioactive prostate seed implants in 1997;    due to PSA rising pt has been doing lupron injection's few past several yrs at dr bell office  . Renal calculi    bilateral per CT 06-18-2018  . S/P CABG x 5 04/04/2005   LIMA to LAD,  SVG to Ramus,  SVG to OM,  seqSVG to PLV and PDA  . Wears glasses    Past Surgical History:  Procedure Laterality Date  . CARDIAC CATHETERIZATION  04/02/2005   dr Doreatha Lew    Critical three-vessel coronary disease --  Essentially normal left ventricular function , ef 55%  . CARDIOVASCULAR STRESS TEST  05-01-2008  dr Doreatha Lew /  dr harding   Low risk nuclear study w/ no ischemia or scar but flattened septal  motion/  ef 64%  . CARDIOVERSION N/A 11/19/2016   Procedure: CARDIOVERSION;  Surgeon: Jerline Pain, MD;  Location: [facility];  Service: Cardiovascular;  Laterality: N/A;  . COLONOSCOPY N/A 10/23/2016   Procedure: COLONOSCOPY;  Surgeon: Milus Banister, MD;  Location: [facility];  Service: Endoscopy;  Laterality: N/A;  . CORONARY ARTERY BYPASS GRAFT  04/04/2005   dr  Ricard Dillon   LIMA - LAD,  SVG - Ramus, SVG - OM,  seqSVG - PLV and PDA  . CYSTOSCOPY WITH LITHOLAPAXY N/A 05/07/2016   Procedure: CYSTOSCOPY WITH LITHOLAPAXY;  Surgeon: Rana Snare, MD;  [facility];  Service: Urology;  Laterality: N/A;  . CYSTOSCOPY WITH RETROGRADE PYELOGRAM, URETEROSCOPY AND STENT PLACEMENT  09/03/2018   Procedure: CYSTOSCOPY WITH RETROGRADE PYELOGRAM,  URETEROSCOPY LASER LITHOTRIPSY AND STENT PLACEMENT;  Surgeon: Lucas Mallow, MD;  Location: [facility];  Service: Urology;;  . HOLMIUM LASER APPLICATION N/A 65/09/5463   Procedure: HOLMIUM LASER APPLICATION;  Surgeon: Rana Snare, MD;  Location: [facility];  Service: Urology;  Laterality: N/A;  . INGUINAL HERNIA REPAIR Left 03-30-2005  dr Excell Seltzer   incarcerated   . RADIOACTIVE PROSTATE SEED IMPLANTS  1997  . TRANSTHORACIC ECHOCARDIOGRAM  08/24/2014   mid anteroseptal and distal septak hypokinetic,  mild focal basal LVH, ef 60-65%,  grade 1 diastolic dysfunction/  mild MR/  mild LAE/  mild dilated RV with normal RVSP/  trivial TR   Social History   Socioeconomic History  . Marital status: Widowed    Spouse name: Not on file  . Number of children: 3  . Years of education: Not on file  . Highest education level: Not on file  Occupational History  . Occupation: retired  Scientific laboratory technician  . Financial resource strain: Not on file  . Food insecurity:    Worry: Not on file    Inability: Not on file  . Transportation needs:    Medical: Not on file    Non-medical: Not on file  Tobacco Use  . Smoking status: Former Smoker    Years: 2.00    Types: Cigarettes    Last attempt to quit: 07/28/1960    Years since quitting: 58.2  . Smokeless tobacco: Never Used  Substance and Sexual Activity  . Alcohol use: No  . Drug use: No  . Sexual activity: Never  Lifestyle  . Physical activity:    Days per week: 0 days    Minutes per session: Not on file  . Stress: Only a little  Relationships  . Social connections:    Talks on phone: Not on file    Gets together: Not on file    Attends religious service: Not on file    Active member of club or organization: Not on file    Attends meetings of clubs or organizations: Not on file    Relationship status: Not on file  Other Topics Concern  . Not on file  Social History Narrative   Widowed lives with daughter [name]   Family  History  Problem Relation Age of Onset  . Heart disease Mother   . Cancer Mother   . Cancer Father   . Cancer Brother    Allergies  Allergen Reactions  . Ciprofloxacin Other (See Comments)    PER HEART DR   Prior to Admission medications   Medication Sig Start Date End Date Taking? Authorizing Provider  acetaminophen (TYLENOL) 500 MG tablet Take 1,000 mg by mouth every 6 (six)  hours as needed for mild pain (for pain).    Yes [provider]  amiodarone (PACERONE) 200 MG tablet Take 1 tablet (200 mg total) by mouth daily. 07/12/18  Yes Dunn, Areta Haber, PA-C  apixaban (ELIQUIS) 5 MG TABS tablet Take 1 tablet (5 mg total) by mouth 2 (two) times daily. 09/29/18  Yes Dunn, Areta Haber, PA-C  omeprazole (PRILOSEC) 40 MG capsule Take 1 capsule (40 mg total) by mouth daily. 06/18/18  Yes [name], [name], MD  rosuvastatin (CRESTOR) 40 MG tablet Take 1 tablet (40 mg total) by mouth daily. 06/18/18  Yes [name], [name], MD  senna-docusate (SENOKOT-S) 8.6-50 MG tablet Take 1 tablet by mouth 2 (two) times daily. Patient taking differently: Take 1 tablet by mouth 2 (two) times daily as needed for mild constipation.  08/09/18  Yes Eugenie Filler, MD  tamsulosin (FLOMAX) 0.4 MG CAPS capsule Take 1 capsule (0.4 mg total) by mouth daily after supper. 01/06/18  Yes [name], MD  HYDROcodone-acetaminophen (NORCO/VICODIN) 5-325 MG tablet Take 1 tablet by mouth every 4 (four) hours as needed for moderate pain. Patient not taking: Reported on 10/05/2018 09/03/18 09/03/19  Marton Redwood III, MD  hydrocortisone cream 1 % Apply topically 4 (four) times daily. Patient not taking: Reported on 10/05/2018 08/09/18   Eugenie Filler, MD  Menthol-Methyl Salicylate (MUSCLE RUB) 10-15 % CREA Apply 1 application topically as needed for muscle pain (lower back pain). Patient not taking: Reported on 10/05/2018 08/09/18   Eugenie Filler, MD  opium-belladonna (B&O SUPPRETTES) 16.2-60 MG suppository Place 1  suppository rectally every 6 (six) hours as needed for bladder spasms. Patient not taking: Reported on 08/26/2018 08/09/18   Eugenie Filler, MD  polyethylene glycol [facility] / Floria Raveling) packet Take 17 g by mouth daily. Patient not taking: Reported on 08/26/2018 08/10/18   Eugenie Filler, MD   Dg Chest 2 View  Result Date: 10/05/2018 CLINICAL DATA:  Recent fall with right hip pain, initial encounter EXAM: CHEST - 2 VIEW COMPARISON:  01/04/2018 FINDINGS: Right-sided PICC line is noted at the cavoatrial junction. Postsurgical changes are again seen. Cardiac shadow is not significantly enlarged. The lungs are well aerated bilaterally. Sclerotic focus is noted within the inferior aspect of the scapula on the left stable from previous exam. No focal infiltrate or sizable effusion is seen. IMPRESSION: No acute abnormality noted. Electronically Signed   By: Inez Catalina M.D.   On: 10/05/2018 10:49   Ct Head Wo Contrast  Result Date: 10/05/2018 CLINICAL DATA:  Several recent falls EXAM: CT HEAD WITHOUT CONTRAST TECHNIQUE: Contiguous axial images were obtained from the base of the skull through the vertex without intravenous contrast. COMPARISON:  None. FINDINGS: Brain: There is mild diffuse atrophy. There is somewhat greater frontal atrophy, particular on the right, than elsewhere. There is an extra-axial fluid collection in the mid to posterior left frontal region which is of similar attenuation of the brain parenchyma measuring 1.0 cm. This area is localized and has an appearance suggesting epidural fluid. It causes slight localized mass effect on the left frontal lobe, but no midline shift. No other extra-axial fluid is identified beyond fluid felt to be associated with atrophy in the frontal lobes. There is no intra-axial mass or hemorrhage. There is patchy small vessel disease in the centra semiovale bilaterally. No acute appearing infarct is evident. Vascular: No hyperdense vessel. There is calcification  in each carotid siphon region. Skull: The bony calvarium appears intact. Sinuses/Orbits: There is  opacification in portions of the left anterior ethmoid and inferior left frontal sinus regions. Other visualized paranasal sinuses are clear. Orbits appear symmetric bilaterally. Other: Visualized mastoid air cells are clear. IMPRESSION: Probable subacute focal epidural hematoma left frontal region with maximum thickness of 1.0 cm. It causes minimal mass effect upon the adjacent left frontal lobe but no midline shift. No other extra-axial fluid identified. Note that there is moderate frontal atrophy bilaterally, slightly more severe on the right than on the left. There is patchy periventricular small vessel disease. No acute infarct evident. No intra-axial hemorrhage or mass. There are foci of arterial vascular calcification. There is ethmoid and frontal sinus disease on the left side. Critical Value/emergent results were called by telephone at the time of interpretation on 10/05/2018 at 11:18 am to Dr. Theotis Burrow , who verbally acknowledged these results. Electronically Signed   By: Lowella Grip III M.D.   On: 10/05/2018 11:19   Ct Hip Right Wo Contrast  Result Date: 10/05/2018 CLINICAL DATA:  Right hip pain. Patient fell last night and again today. EXAM: CT OF THE RIGHT HIP WITHOUT CONTRAST TECHNIQUE: Multidetector CT imaging of the right hip was performed according to the standard protocol. Multiplanar CT image reconstructions were also generated. COMPARISON:  Radiographs 10/05/2018.  Pelvic CT 08/16/2018. FINDINGS: Bones/Joint/Cartilage The bones are demineralized. There is an acute fracture of the mid right femoral neck which is mildly impacted laterally. There is no significant displacement. This fracture demonstrates no intertrochanteric extension or involvement of the femoral head articular surface. There are stable underlying mild right hip degenerative changes. No evidence of acute fracture of the  right hemipelvis. There is no significant hip joint effusion. Sclerotic lesion in the right superior acetabulum is stable, consistent with a bone island. Ligaments Suboptimally assessed by CT. Muscles and Tendons The visualized pelvic and right hip muscles appear unremarkable. Soft tissues Minimal subcutaneous edema lateral to the greater trochanter. No significant soft tissue hematoma. Prostate brachytherapy seeds are noted. Small right-sided bladder calculi are noted without evidence of distal ureteral dilatation. IMPRESSION: 1. Mildly impacted acute fracture of the mid right femoral neck. This demonstrates no pathologic features. 2. No significant associated hematoma or joint effusion. 3. Mild underlying right hip degenerative changes. 4. Persistent right-sided bladder calculi. Electronically Signed   By: Richardean Sale M.D.   On: 10/05/2018 13:27   Dg Hip Unilat  With Pelvis 2-3 Views Right  Result Date: 10/05/2018 CLINICAL DATA:  Recent fall with right-sided hip pain, initial encounter EXAM: DG HIP (WITH OR WITHOUT PELVIS) 2-3V RIGHT COMPARISON:  None. FINDINGS: Pelvic ring is intact. There is a lucency identified in the right femoral neck with some impaction identified consistent with mildly displaced fracture. CT would be confirmatory. Prostate brachytherapy seeds are noted. Chronic calcification is noted within the proximal left femur. Degenerative changes of lumbar spine are noted. IMPRESSION: Lucency in the right femoral neck with suggestion of mild impaction. CT would be helpful for confirmation. Electronically Signed   By: Inez Catalina M.D.   On: 10/05/2018 10:52    Positive ROS: All other systems have been reviewed and were otherwise negative with the exception of those mentioned in the HPI and as above.  Physical Exam: General: Alert, no acute distress Cardiovascular: No pedal edema Respiratory: No cyanosis, no use of accessory musculature GI: No organomegaly, abdomen is soft and  non-tender Skin: No lesions in the area of chief complaint Neurologic: Sensation intact distally Psychiatric: Patient is competent for consent with normal mood  and affect Lymphatic: No axillary or cervical lymphadenopathy  MUSCULOSKELETAL: Examination of the right hip reveals no skin wounds or lesions.  He is mildly shortened.  He is externally rotated.  He has pain with attempted logrolling of the hip.  Unable to perform a straight leg raise.  He is neurovascularly intact distally.  Assessment: Comminuted right femoral neck fracture with retroversion of head.  Plan: I discussed the findings with the patient.  Given the CT findings, I recommend primary arthroplasty procedure.  This will allow for immediate weightbearing as tolerated.  We did discuss the risk, benefits, and alternatives to right total hip arthroplasty.  We will delay surgery until tomorrow due to Eliquis usage.  He may eat today.  N.p.o. after midnight tonight.  All questions were solicited and answered.    Bertram Savin, MD Cell [phone]    10/06/2018 8:33 AM

## 2018-10-06 NOTE — Progress Notes (Signed)
Initial Nutrition Assessment  INTERVENTION:   Provide Ensure Enlive po BID, each supplement provides 350 kcal and 20 grams of protein  NUTRITION DIAGNOSIS:   Increased nutrient needs related to post-op healing(hip fracture) as evidenced by estimated needs.  GOAL:   Patient will meet greater than or equal to 90% of their needs  MONITOR:   PO intake, Supplement acceptance, Labs, Weight trends, I & O's, Skin  REASON FOR ASSESSMENT:   Consult Hip fracture protocol  ASSESSMENT:   83 yr old man who carries a past medical history significant for AAA, arthritis, bladder calculus, Epidural hemorrhage, chronic anemia, anxiety, CKD Stage III,,Diastolic CHF, Hyperlipidemia, Hypertension, BPH, paroxysmal atrial fibrillation for which the patient is on eliquis, renal calculi, and recurrent prostate cancer. Admitted  for further evaluation and plan of left hip fracture.  Patient in room with no family at bedside. Pt reports poor appetite for a few days PTA. Pt states prior to this he was eating well with good appetite. States he does drink Ensure supplements at home, RD to order.  Pt is able to eat today, consuming 25% of meals. For breakfast he states he ate some sausage, eggs and a bite of grits. Pt will be NPO at midnight for hip surgery on 3/12.   Pt kept falling asleep so was unable to gather weight history. Per weight records in chart, pt has lost 4 lb since August 2019. No significant weight loss.   Labs reviewed. Medications reviewed.   NUTRITION - FOCUSED PHYSICAL EXAM:  Nutrition focused physical exam shows no sign of depletion of muscle mass or body fat.  Diet Order:   Diet Order            Diet Heart Room service appropriate? Yes; Fluid consistency: Thin  Diet effective now              EDUCATION NEEDS:   Education needs have been addressed  Skin:  Skin Assessment: Skin Integrity Issues: Skin Integrity Issues:: Stage I Stage I: sacrum  Last BM:  3/8   Height:    Ht Readings from Last 1 Encounters:  10/05/18 5\' 11"  (1.803 m)    Weight:   Wt Readings from Last 1 Encounters:  10/05/18 74.1 kg    Ideal Body Weight:  78.2 kg  BMI:  Body mass index is 22.78 kg/m.  Estimated Nutritional Needs:   Kcal:  1850-2050  Protein:  90-100g  Fluid:  2L/day  Clayton Bibles, MS, RD, LDN [place] Dietitian Pager: [phone] After Hours Pager: [phone]

## 2018-10-06 NOTE — TOC Initial Note (Signed)
Transition of Care [facility]) - Initial/Assessment Note    Patient Details  Name: Carl [name] MRN: [id number] Date of Birth: April 19, 1936  Transition of Care [facility]) CM/SW Contact:    Lia Hopping, [place] Phone Number: 10/06/2018, 3:40 PM  Clinical Narrative:                 CSW met with the patient at bedside, explain role and reason for visit. Patient son and grandson agreeable to talk with CSW. Patient son express he is agreeable for SNF placement. He reports the patient had Home Health in the past but feels the patient will need more rehab and support after surgery. Patient son reports the patient lives in the home with his spouse, grandson, grandson girlfriend. Patient son reports at baseline the patient uses a walker to ambulate. He reports, "the patient is usually in his chair and does not have a lot of mobility throughout the day." Patient son reports the patient fell twice on Monday. Son states the patient reported feeling "fuzzy, light headed and weak.  CSW explain SNF process and will later follow up with bed offers.  FL2 to be completed.    Expected Discharge Plan: Skilled Nursing Facility Barriers to Discharge: Continued Medical Work up, Ship broker   Patient Goals and CMS Choice Patient states their goals for this hospitalization and ongoing recovery are: " to regain his strength "     Expected Discharge Plan and Services Expected Discharge Plan: [place] Discharge Planning Services: CM Consult     Expected Discharge Date: (unknown)                      Prior Living Arrangements/Services   Lives with:: Adult Children, Spouse Patient language and need for interpreter reviewed:: Yes Do you feel safe going back to the place where you live?: Yes      Need for Family Participation in Patient Care: Yes (Comment) Care giver support system in place?: Yes (comment) Current home services: Home PT Criminal Activity/Legal Involvement Pertinent  to Current Situation/Hospitalization: No - Comment as needed  Activities of Daily Living Home Assistive Devices/Equipment: Environmental consultant (specify type), Wheelchair, Radio producer (specify quad or straight) ADL Screening (condition at time of admission) Patient's cognitive ability adequate to safely complete daily activities?: Yes Is the patient deaf or have difficulty hearing?: Yes Does the patient have difficulty seeing, even when wearing glasses/contacts?: No Does the patient have difficulty concentrating, remembering, or making decisions?: Yes(trouble concentrating) Patient able to express need for assistance with ADLs?: Yes Does the patient have difficulty dressing or bathing?: No Independently performs ADLs?: Yes (appropriate for developmental age) Does the patient have difficulty walking or climbing stairs?: Yes Weakness of Legs: Right Weakness of Arms/Hands: None  Permission Sought/Granted Permission sought to share information with : Case Manager, Family Supports, Chartered certified accountant granted to share information with : Yes, Verbal Permission Granted  Share Information with NAME: Hadley Pen.   Permission granted to share info w AGENCY: [place] granted to share info w Relationship: Son   Permission granted to share info w Contact Information: [phone]  Emotional Assessment Appearance:: Appears stated age   Affect (typically observed): Accepting, Calm Orientation: : Oriented to Self Alcohol / Substance Use: Not Applicable Psych Involvement: No (comment)  Admission diagnosis:  Pre-op chest exam [L79.892] Closed fracture of neck of right femur, initial encounter ([place]) [S72.001A] Traumatic epidural hematoma without loss of consciousness, initial encounter ([place]) [S06.4X0A]  Patient Active Problem List   Diagnosis Date Noted  . Hip fracture ([place]) 10/05/2018  . Intracranial hematoma ([place]) 10/05/2018  . AAA (abdominal aortic  aneurysm) ([place]) 08/26/2018  . Pseudomonas aeruginosa infection 08/06/2018  . Acute cystitis with hematuria   . Pseudomonas urinary tract infection 08/05/2018  . Hematuria 08/05/2018  . Pressure injury of skin 08/05/2018  . Kidney stones 06/30/2018  . Allergic rhinitis 04/26/2018  . Skin tear of elbow without complication 95/18/8416  . Complicated UTI (urinary tract infection) 01/05/2018  . Urolithiasis 01/05/2018  . Frequent urination 01/01/2018  . Colon polyps 09/15/2017  . GERD (gastroesophageal reflux disease) 09/15/2017  . Wrist pain, acute, right 05/25/2017  . Low back pain 03/24/2017  . Lung nodule seen on imaging study: 7 mm nodule left midlung 03/13/2017  . Physical deconditioning 03/13/2017  . Normocytic anemia 03/13/2017  . Fall 03/12/2017  . Abdominal soreness 03/12/2017  . Generalized weakness 03/12/2017  . Leukocytosis 03/12/2017  . Hyponatremia 03/12/2017  . Orthostatic hypotension 02/16/2017  . Blood in stool 09/19/2016  . Anticoagulated 09/19/2016  . History of prostate cancer 09/04/2016  . Persistent atrial fibrillation 09/04/2016  . Acute congestive heart failure ([place]) 09/04/2016  . Cardiomyopathy, ischemic 07/16/2012  . Chronic kidney disease (CKD), stage III (moderate) ([place]) 07/16/2012  . Essential hypertension 08/05/2011  . Murmur 08/05/2011  . 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 Dyslipidemia 02/04/2011   PCP:  [name], MD Pharmacy:   [id number] [id number]: [phone]: [phone]    Social Determinants of Health (SDOH) Interventions    Readmission Risk Interventions 30 Day Unplanned Readmission Risk Score     ED to Hosp-Admission (Current) from 10/05/2018 in [place] 30 Day Unplanned Readmission Risk Score (%)  16 Filed at 10/06/2018 1200     This score is the patient's risk of an unplanned  readmission within 30 days of being discharged (0 -100%). The score is based on dignosis, age, lab data, medications, orders, and past utilization.   Low:  0-14.9   Medium: 15-21.9   High: 22-29.9   Extreme: 30 and above       No flowsheet data found.

## 2018-10-06 NOTE — H&P (View-Only) (Signed)
ORTHOPAEDIC CONSULTATION  REQUESTING PHYSICIAN: Carl Hazel, MD  PCP:  [name], MD  Chief Complaint: R hip fracture  HPI: Carl [name] is a 83 y.o. male who complains of right hip pain after a ground-level fall yesterday when he lost his balance.  Patient had right hip pain and inability to weight-bear.  He was brought to the hospital, where x-rays and a CT scan revealed a comminuted fracture of the right femoral neck with retroversion of the head.  CT scan of the head showed a subacute subdural hematoma, and neurosurgery consultation was obtained.  Patient takes Eliquis.  Last dose was yesterday morning.  His anticoagulants are currently being held.  He denies other injuries.  He is a Hydrographic surveyor with a cane.  He endorses several falls recently.  I was asked to assume care by Dr. Mardelle Matte / Griffin Basil.  Past Medical History:  Diagnosis Date  . AAA (abdominal aortic aneurysm) ([place])    CT abd. dated 06-18-2018 in epic,  3.1cm;  followed by pcp  . Arthritis    arms  . Bladder calculus   . Chronic anemia   . Chronic anxiety   . CKD (chronic kidney disease), stage III ([place])   . Coronary artery disease CARDIOLOGIST-  DR Rockey Situ   a. CABG 2006 with  LIMA-LAD, SVG-Ramus, SVG-OM, seq SVG-PLV-PDA.  . Diastolic CHF ([place])    followed by cardiology  . DOE (dyspnea on exertion)   . GERD (gastroesophageal reflux disease)   . Hematuria   . History of bladder stone   . History of non-ST elevation myocardial infarction (NSTEMI) 03/30/2005   post op recovery inguinal hernia repair  . History of recurrent UTIs    last admission 08-04-2018 pseudomonas UTI  . Hyperlipidemia   . Hyperplasia of prostate without lower urinary tract symptoms (LUTS)   . Hypertension   . Ischemic cardiomyopathy    echo (01/13) 45-50% septal akinesis, mild MR/  echo  (01/16) ef 60-65%, mid-apical anteroseptal hypokinesis;   echo 09-16-2016 ef 50-55%  . Left ureteral stone   . Lung nodule     noted 08/ 2018;  followed by pcp  . Moderate mitral regurgitation   . Orthostatic hypotension   . PAC (premature atrial contraction)   . Paroxysmal atrial flutter ([place])    a. dx 08/2016, started on amiodarone.;  08/ 2018 AFlutter  . Persistent atrial fibrillation followed by cardiologist-- dr Rockey Situ   dx AFib 02/ 2018  s/p  DCCV 04/ 2018;  08/ 2018  AFlutter w/ RVR in setting UTI  . Pulmonary hypertension ([place])    last echo in epic 09-16-2016  . Recurrent prostate cancer ([place]) UROLOGIST-- DR BELL   s/p  radioactive prostate seed implants in 1997;    due to PSA rising pt has been doing lupron injection's few past several yrs at dr bell office  . Renal calculi    bilateral per CT 06-18-2018  . S/P CABG x 5 04/04/2005   LIMA to LAD,  SVG to Ramus,  SVG to OM,  seqSVG to PLV and PDA  . Wears glasses    Past Surgical History:  Procedure Laterality Date  . CARDIAC CATHETERIZATION  04/02/2005   dr Doreatha Lew    Critical three-vessel coronary disease --  Essentially normal left ventricular function , ef 55%  . CARDIOVASCULAR STRESS TEST  05-01-2008  dr Doreatha Lew /  dr harding   Low risk nuclear study w/ no ischemia or scar but flattened septal  motion/  ef 64%  . CARDIOVERSION N/A 11/19/2016   Procedure: CARDIOVERSION;  Surgeon: Jerline Pain, MD;  Location: [place];  Service: Cardiovascular;  Laterality: N/A;  . COLONOSCOPY N/A 10/23/2016   Procedure: COLONOSCOPY;  Surgeon: Milus Banister, MD;  Location: [facility];  Service: Endoscopy;  Laterality: N/A;  . CORONARY ARTERY BYPASS GRAFT  04/04/2005   dr  Ricard Dillon   LIMA - LAD,  SVG - Ramus, SVG - OM,  seqSVG - PLV and PDA  . CYSTOSCOPY WITH LITHOLAPAXY N/A 05/07/2016   Procedure: CYSTOSCOPY WITH LITHOLAPAXY;  Surgeon: Rana Snare, MD;  Location: [facility];  Service: Urology;  Laterality: N/A;  . CYSTOSCOPY WITH RETROGRADE PYELOGRAM, URETEROSCOPY AND STENT PLACEMENT  09/03/2018   Procedure: CYSTOSCOPY WITH RETROGRADE PYELOGRAM,  URETEROSCOPY LASER LITHOTRIPSY AND STENT PLACEMENT;  Surgeon: Lucas Mallow, MD;  Location: [facility];  Service: Urology;;  . HOLMIUM LASER APPLICATION N/A 65/68/1275   Procedure: HOLMIUM LASER APPLICATION;  Surgeon: Rana Snare, MD;  Location: [facility];  Service: Urology;  Laterality: N/A;  . INGUINAL HERNIA REPAIR Left 03-30-2005  dr Excell Seltzer   incarcerated   . RADIOACTIVE PROSTATE SEED IMPLANTS  1997  . TRANSTHORACIC ECHOCARDIOGRAM  08/24/2014   mid anteroseptal and distal septak hypokinetic,  mild focal basal LVH, ef 60-65%,  grade 1 diastolic dysfunction/  mild MR/  mild LAE/  mild dilated RV with normal RVSP/  trivial TR   Social History   Socioeconomic History  . Marital status: Widowed    Spouse name: Not on file  . Number of children: 3  . Years of education: Not on file  . Highest education level: Not on file  Occupational History  . Occupation: retired  Scientific laboratory technician  . Financial resource strain: Not on file  . Food insecurity:    Worry: Not on file    Inability: Not on file  . Transportation needs:    Medical: Not on file    Non-medical: Not on file  Tobacco Use  . Smoking status: Former Smoker    Years: 2.00    Types: Cigarettes    Last attempt to quit: 07/28/1960    Years since quitting: 58.2  . Smokeless tobacco: Never Used  Substance and Sexual Activity  . Alcohol use: No  . Drug use: No  . Sexual activity: Never  Lifestyle  . Physical activity:    Days per week: 0 days    Minutes per session: Not on file  . Stress: Only a little  Relationships  . Social connections:    Talks on phone: Not on file    Gets together: Not on file    Attends religious service: Not on file    Active member of club or organization: Not on file    Attends meetings of clubs or organizations: Not on file    Relationship status: Not on file  Other Topics Concern  . Not on file  Social History Narrative   Widowed lives with daughter [name]   Family  History  Problem Relation Age of Onset  . Heart disease Mother   . Cancer Mother   . Cancer Father   . Cancer Brother    Allergies  Allergen Reactions  . Ciprofloxacin Other (See Comments)    PER HEART DR   Prior to Admission medications   Medication Sig Start Date End Date Taking? Authorizing Provider  acetaminophen (TYLENOL) 500 MG tablet Take 1,000 mg by mouth every 6 (six)  hours as needed for mild pain (for pain).    Yes [provider]  amiodarone (PACERONE) 200 MG tablet Take 1 tablet (200 mg total) by mouth daily. 07/12/18  Yes Dunn, Areta Haber, PA-C  apixaban (ELIQUIS) 5 MG TABS tablet Take 1 tablet (5 mg total) by mouth 2 (two) times daily. 09/29/18  Yes Dunn, Areta Haber, PA-C  omeprazole (PRILOSEC) 40 MG capsule Take 1 capsule (40 mg total) by mouth daily. 06/18/18  Yes [name], [name], MD  rosuvastatin (CRESTOR) 40 MG tablet Take 1 tablet (40 mg total) by mouth daily. 06/18/18  Yes [name], [name], MD  senna-docusate (SENOKOT-S) 8.6-50 MG tablet Take 1 tablet by mouth 2 (two) times daily. Patient taking differently: Take 1 tablet by mouth 2 (two) times daily as needed for mild constipation.  08/09/18  Yes Eugenie Filler, MD  tamsulosin (FLOMAX) 0.4 MG CAPS capsule Take 1 capsule (0.4 mg total) by mouth daily after supper. 01/06/18  Yes [name], MD  HYDROcodone-acetaminophen (NORCO/VICODIN) 5-325 MG tablet Take 1 tablet by mouth every 4 (four) hours as needed for moderate pain. Patient not taking: Reported on 10/05/2018 09/03/18 09/03/19  Marton Redwood III, MD  hydrocortisone cream 1 % Apply topically 4 (four) times daily. Patient not taking: Reported on 10/05/2018 08/09/18   Eugenie Filler, MD  Menthol-Methyl Salicylate (MUSCLE RUB) 10-15 % CREA Apply 1 application topically as needed for muscle pain (lower back pain). Patient not taking: Reported on 10/05/2018 08/09/18   Eugenie Filler, MD  opium-belladonna (B&O SUPPRETTES) 16.2-60 MG suppository Place 1  suppository rectally every 6 (six) hours as needed for bladder spasms. Patient not taking: Reported on 08/26/2018 08/09/18   Eugenie Filler, MD  polyethylene glycol [facility] / Floria Raveling) packet Take 17 g by mouth daily. Patient not taking: Reported on 08/26/2018 08/10/18   Eugenie Filler, MD   Dg Chest 2 View  Result Date: 10/05/2018 CLINICAL DATA:  Recent fall with right hip pain, initial encounter EXAM: CHEST - 2 VIEW COMPARISON:  01/04/2018 FINDINGS: Right-sided PICC line is noted at the cavoatrial junction. Postsurgical changes are again seen. Cardiac shadow is not significantly enlarged. The lungs are well aerated bilaterally. Sclerotic focus is noted within the inferior aspect of the scapula on the left stable from previous exam. No focal infiltrate or sizable effusion is seen. IMPRESSION: No acute abnormality noted. Electronically Signed   By: Inez Catalina M.D.   On: 10/05/2018 10:49   Ct Head Wo Contrast  Result Date: 10/05/2018 CLINICAL DATA:  Several recent falls EXAM: CT HEAD WITHOUT CONTRAST TECHNIQUE: Contiguous axial images were obtained from the base of the skull through the vertex without intravenous contrast. COMPARISON:  None. FINDINGS: Brain: There is mild diffuse atrophy. There is somewhat greater frontal atrophy, particular on the right, than elsewhere. There is an extra-axial fluid collection in the mid to posterior left frontal region which is of similar attenuation of the brain parenchyma measuring 1.0 cm. This area is localized and has an appearance suggesting epidural fluid. It causes slight localized mass effect on the left frontal lobe, but no midline shift. No other extra-axial fluid is identified beyond fluid felt to be associated with atrophy in the frontal lobes. There is no intra-axial mass or hemorrhage. There is patchy small vessel disease in the centra semiovale bilaterally. No acute appearing infarct is evident. Vascular: No hyperdense vessel. There is calcification  in each carotid siphon region. Skull: The bony calvarium appears intact. Sinuses/Orbits: There is  opacification in portions of the left anterior ethmoid and inferior left frontal sinus regions. Other visualized paranasal sinuses are clear. Orbits appear symmetric bilaterally. Other: Visualized mastoid air cells are clear. IMPRESSION: Probable subacute focal epidural hematoma left frontal region with maximum thickness of 1.0 cm. It causes minimal mass effect upon the adjacent left frontal lobe but no midline shift. No other extra-axial fluid identified. Note that there is moderate frontal atrophy bilaterally, slightly more severe on the right than on the left. There is patchy periventricular small vessel disease. No acute infarct evident. No intra-axial hemorrhage or mass. There are foci of arterial vascular calcification. There is ethmoid and frontal sinus disease on the left side. Critical Value/emergent results were called by telephone at the time of interpretation on 10/05/2018 at 11:18 am to Dr. Theotis Burrow , who verbally acknowledged these results. Electronically Signed   By: Lowella Grip III M.D.   On: 10/05/2018 11:19   Ct Hip Right Wo Contrast  Result Date: 10/05/2018 CLINICAL DATA:  Right hip pain. Patient fell last night and again today. EXAM: CT OF THE RIGHT HIP WITHOUT CONTRAST TECHNIQUE: Multidetector CT imaging of the right hip was performed according to the standard protocol. Multiplanar CT image reconstructions were also generated. COMPARISON:  Radiographs 10/05/2018.  Pelvic CT 08/16/2018. FINDINGS: Bones/Joint/Cartilage The bones are demineralized. There is an acute fracture of the mid right femoral neck which is mildly impacted laterally. There is no significant displacement. This fracture demonstrates no intertrochanteric extension or involvement of the femoral head articular surface. There are stable underlying mild right hip degenerative changes. No evidence of acute fracture of the  right hemipelvis. There is no significant hip joint effusion. Sclerotic lesion in the right superior acetabulum is stable, consistent with a bone island. Ligaments Suboptimally assessed by CT. Muscles and Tendons The visualized pelvic and right hip muscles appear unremarkable. Soft tissues Minimal subcutaneous edema lateral to the greater trochanter. No significant soft tissue hematoma. Prostate brachytherapy seeds are noted. Small right-sided bladder calculi are noted without evidence of distal ureteral dilatation. IMPRESSION: 1. Mildly impacted acute fracture of the mid right femoral neck. This demonstrates no pathologic features. 2. No significant associated hematoma or joint effusion. 3. Mild underlying right hip degenerative changes. 4. Persistent right-sided bladder calculi. Electronically Signed   By: Richardean Sale M.D.   On: 10/05/2018 13:27   Dg Hip Unilat  With Pelvis 2-3 Views Right  Result Date: 10/05/2018 CLINICAL DATA:  Recent fall with right-sided hip pain, initial encounter EXAM: DG HIP (WITH OR WITHOUT PELVIS) 2-3V RIGHT COMPARISON:  None. FINDINGS: Pelvic ring is intact. There is a lucency identified in the right femoral neck with some impaction identified consistent with mildly displaced fracture. CT would be confirmatory. Prostate brachytherapy seeds are noted. Chronic calcification is noted within the proximal left femur. Degenerative changes of lumbar spine are noted. IMPRESSION: Lucency in the right femoral neck with suggestion of mild impaction. CT would be helpful for confirmation. Electronically Signed   By: Inez Catalina M.D.   On: 10/05/2018 10:52    Positive ROS: All other systems have been reviewed and were otherwise negative with the exception of those mentioned in the HPI and as above.  Physical Exam: General: Alert, no acute distress Cardiovascular: No pedal edema Respiratory: No cyanosis, no use of accessory musculature GI: No organomegaly, abdomen is soft and  non-tender Skin: No lesions in the area of chief complaint Neurologic: Sensation intact distally Psychiatric: Patient is competent for consent with normal mood  and affect Lymphatic: No axillary or cervical lymphadenopathy  MUSCULOSKELETAL: Examination of the right hip reveals no skin wounds or lesions.  He is mildly shortened.  He is externally rotated.  He has pain with attempted logrolling of the hip.  Unable to perform a straight leg raise.  He is neurovascularly intact distally.  Assessment: Comminuted right femoral neck fracture with retroversion of head.  Plan: I discussed the findings with the patient.  Given the CT findings, I recommend primary arthroplasty procedure.  This will allow for immediate weightbearing as tolerated.  We did discuss the risk, benefits, and alternatives to right total hip arthroplasty.  We will delay surgery until tomorrow due to Eliquis usage.  He may eat today.  N.p.o. after midnight tonight.  All questions were solicited and answered.    Bertram Savin, MD Cell [phone]    10/06/2018 8:33 AM

## 2018-10-07 ENCOUNTER — Inpatient Hospital Stay (HOSPITAL_COMMUNITY): Payer: Medicare Other

## 2018-10-07 ENCOUNTER — Encounter (HOSPITAL_COMMUNITY)
Admission: EM | Disposition: A | Discharge status: Skilled Nursing Facility | Payer: Self-pay | Source: Home / Self Care | Attending: Internal Medicine

## 2018-10-07 ENCOUNTER — Inpatient Hospital Stay (HOSPITAL_COMMUNITY): Payer: Medicare Other | Admitting: Certified Registered"

## 2018-10-07 DIAGNOSIS — S72001A Fracture of unspecified part of neck of right femur, initial encounter for closed fracture: Secondary | ICD-10-CM | POA: Diagnosis present

## 2018-10-07 HISTORY — PX: TOTAL HIP ARTHROPLASTY: SHX124

## 2018-10-07 LAB — TYPE AND SCREEN
ABO/RH(D): O NEG
Antibody Screen: NEGATIVE

## 2018-10-07 SURGERY — ARTHROPLASTY, HIP, TOTAL, ANTERIOR APPROACH
Anesthesia: General | Site: Hip | Laterality: Right

## 2018-10-07 MED ORDER — FENTANYL CITRATE (PF) 100 MCG/2ML IJ SOLN
INTRAMUSCULAR | Status: AC
  Filled 2018-10-07: qty 2

## 2018-10-07 MED ORDER — ONDANSETRON HCL 4 MG PO TABS: 4.0000 mg | ORAL_TABLET | Freq: Four times a day (QID) | ORAL | Status: DC | PRN

## 2018-10-07 MED ORDER — LACTATED RINGERS IV SOLN
INTRAVENOUS | Status: DC | PRN
  Administered 2018-10-07: 14:00:00 via INTRAVENOUS

## 2018-10-07 MED ORDER — SUGAMMADEX SODIUM 200 MG/2ML IV SOLN
INTRAVENOUS | Status: DC | PRN
  Administered 2018-10-07: 150 mg via INTRAVENOUS

## 2018-10-07 MED ORDER — APIXABAN 5 MG PO TABS
5.0000 mg | ORAL_TABLET | Freq: Two times a day (BID) | ORAL | Status: DC
  Administered 2018-10-11: 5 mg via ORAL
  Filled 2018-10-07: qty 1

## 2018-10-07 MED ORDER — ONDANSETRON HCL 4 MG/2ML IJ SOLN: 4.0000 mg | Freq: Four times a day (QID) | INTRAMUSCULAR | Status: DC | PRN

## 2018-10-07 MED ORDER — KETOROLAC TROMETHAMINE 30 MG/ML IJ SOLN
INTRAMUSCULAR | Status: AC
  Filled 2018-10-07: qty 1

## 2018-10-07 MED ORDER — PHENYLEPHRINE 40 MCG/ML (10ML) SYRINGE FOR IV PUSH (FOR BLOOD PRESSURE SUPPORT)
PREFILLED_SYRINGE | INTRAVENOUS | Status: DC | PRN
  Administered 2018-10-07: 40 ug via INTRAVENOUS
  Administered 2018-10-07 (×3): 120 ug via INTRAVENOUS
  Administered 2018-10-07: 80 ug via INTRAVENOUS
  Administered 2018-10-07 (×3): 120 ug via INTRAVENOUS

## 2018-10-07 MED ORDER — SODIUM CHLORIDE 0.9% FLUSH
10.0000 mL | INTRAVENOUS | Status: DC | PRN
  Administered 2018-10-08: 10 mL
  Filled 2018-10-07: qty 40

## 2018-10-07 MED ORDER — ISOPROPYL ALCOHOL 70 % SOLN
Status: AC
  Filled 2018-10-07: qty 480

## 2018-10-07 MED ORDER — PHENYLEPHRINE 40 MCG/ML (10ML) SYRINGE FOR IV PUSH (FOR BLOOD PRESSURE SUPPORT)
PREFILLED_SYRINGE | INTRAVENOUS | Status: AC
  Filled 2018-10-07: qty 10

## 2018-10-07 MED ORDER — DEXAMETHASONE SODIUM PHOSPHATE 10 MG/ML IJ SOLN
INTRAMUSCULAR | Status: AC
  Filled 2018-10-07: qty 1

## 2018-10-07 MED ORDER — ROCURONIUM BROMIDE 100 MG/10ML IV SOLN
INTRAVENOUS | Status: AC
  Filled 2018-10-07: qty 1

## 2018-10-07 MED ORDER — SODIUM CHLORIDE (PF) 0.9 % IJ SOLN
INTRAMUSCULAR | Status: DC | PRN
  Administered 2018-10-07: 30 mL via INTRAVENOUS

## 2018-10-07 MED ORDER — ROCURONIUM BROMIDE 10 MG/ML (PF) SYRINGE
PREFILLED_SYRINGE | INTRAVENOUS | Status: DC | PRN
  Administered 2018-10-07: 60 mg via INTRAVENOUS

## 2018-10-07 MED ORDER — BUPIVACAINE HCL (PF) 0.25 % IJ SOLN
INTRAMUSCULAR | Status: DC | PRN
  Administered 2018-10-07: 30 mL

## 2018-10-07 MED ORDER — WATER FOR IRRIGATION, STERILE IR SOLN
Status: DC | PRN
  Administered 2018-10-07: 3000 mL
  Administered 2018-10-07: 2000 mL

## 2018-10-07 MED ORDER — ACETAMINOPHEN 500 MG PO TABS: 1000.0000 mg | ORAL_TABLET | Freq: Once | ORAL | Status: DC | PRN

## 2018-10-07 MED ORDER — MORPHINE SULFATE (PF) 4 MG/ML IV SOLN
0.5000 mg | INTRAVENOUS | Status: DC | PRN
  Administered 2018-10-09: 1 mg via INTRAVENOUS
  Filled 2018-10-07: qty 1

## 2018-10-07 MED ORDER — METOCLOPRAMIDE HCL 5 MG/ML IJ SOLN: 5.0000 mg | Freq: Three times a day (TID) | INTRAMUSCULAR | Status: DC | PRN

## 2018-10-07 MED ORDER — FENTANYL CITRATE (PF) 250 MCG/5ML IJ SOLN
INTRAMUSCULAR | Status: DC | PRN
  Administered 2018-10-07: 25 ug via INTRAVENOUS
  Administered 2018-10-07: 50 ug via INTRAVENOUS
  Administered 2018-10-07: 25 ug via INTRAVENOUS

## 2018-10-07 MED ORDER — MENTHOL 3 MG MT LOZG: 1.0000 | LOZENGE | OROMUCOSAL | Status: DC | PRN

## 2018-10-07 MED ORDER — BUPIVACAINE-EPINEPHRINE (PF) 0.25% -1:200000 IJ SOLN
INTRAMUSCULAR | Status: AC
  Filled 2018-10-07: qty 30

## 2018-10-07 MED ORDER — DOCUSATE SODIUM 100 MG PO CAPS
100.0000 mg | ORAL_CAPSULE | Freq: Two times a day (BID) | ORAL | Status: DC
  Administered 2018-10-07 – 2018-10-11 (×9): 100 mg via ORAL
  Filled 2018-10-07 (×9): qty 1

## 2018-10-07 MED ORDER — FENTANYL CITRATE (PF) 100 MCG/2ML IJ SOLN
INTRAMUSCULAR | Status: AC
  Administered 2018-10-07: 25 ug via INTRAVENOUS
  Filled 2018-10-07: qty 2

## 2018-10-07 MED ORDER — PHENOL 1.4 % MT LIQD
1.0000 | OROMUCOSAL | Status: DC | PRN
  Filled 2018-10-07: qty 177

## 2018-10-07 MED ORDER — ACETAMINOPHEN 325 MG PO TABS: 325.0000 mg | ORAL_TABLET | Freq: Four times a day (QID) | ORAL | Status: DC | PRN

## 2018-10-07 MED ORDER — ONDANSETRON HCL 4 MG/2ML IJ SOLN
INTRAMUSCULAR | Status: DC | PRN
  Administered 2018-10-07: 4 mg via INTRAVENOUS

## 2018-10-07 MED ORDER — APIXABAN 2.5 MG PO TABS
2.5000 mg | ORAL_TABLET | Freq: Two times a day (BID) | ORAL | Status: AC
  Administered 2018-10-08 – 2018-10-10 (×6): 2.5 mg via ORAL
  Filled 2018-10-07 (×6): qty 1

## 2018-10-07 MED ORDER — CEFAZOLIN SODIUM-DEXTROSE 2-4 GM/100ML-% IV SOLN
2.0000 g | Freq: Four times a day (QID) | INTRAVENOUS | Status: AC
  Administered 2018-10-07 – 2018-10-08 (×2): 2 g via INTRAVENOUS
  Filled 2018-10-07 (×2): qty 100

## 2018-10-07 MED ORDER — EPHEDRINE SULFATE-NACL 50-0.9 MG/10ML-% IV SOSY
PREFILLED_SYRINGE | INTRAVENOUS | Status: DC | PRN
  Administered 2018-10-07: 10 mg via INTRAVENOUS
  Administered 2018-10-07 (×2): 5 mg via INTRAVENOUS

## 2018-10-07 MED ORDER — OXYCODONE HCL 5 MG/5ML PO SOLN: 5.0000 mg | Freq: Once | ORAL | Status: DC | PRN

## 2018-10-07 MED ORDER — BUPIVACAINE HCL (PF) 0.25 % IJ SOLN
INTRAMUSCULAR | Status: AC
  Filled 2018-10-07: qty 30

## 2018-10-07 MED ORDER — PROPOFOL 10 MG/ML IV BOLUS
INTRAVENOUS | Status: AC
  Filled 2018-10-07: qty 20

## 2018-10-07 MED ORDER — SODIUM CHLORIDE (PF) 0.9 % IJ SOLN
INTRAMUSCULAR | Status: AC
  Filled 2018-10-07: qty 50

## 2018-10-07 MED ORDER — LIDOCAINE 2% (20 MG/ML) 5 ML SYRINGE
INTRAMUSCULAR | Status: AC
  Filled 2018-10-07: qty 5

## 2018-10-07 MED ORDER — KETOROLAC TROMETHAMINE 30 MG/ML IJ SOLN
INTRAMUSCULAR | Status: DC | PRN
  Administered 2018-10-07: 30 mg via INTRAVENOUS

## 2018-10-07 MED ORDER — DEXAMETHASONE SODIUM PHOSPHATE 10 MG/ML IJ SOLN
INTRAMUSCULAR | Status: DC | PRN
  Administered 2018-10-07: 8 mg via INTRAVENOUS

## 2018-10-07 MED ORDER — HYDROCODONE-ACETAMINOPHEN 5-325 MG PO TABS
1.0000 | ORAL_TABLET | ORAL | Status: DC | PRN
  Administered 2018-10-07: 2 via ORAL
  Administered 2018-10-08 (×4): 1 via ORAL
  Administered 2018-10-09 – 2018-10-11 (×4): 2 via ORAL
  Filled 2018-10-07 (×3): qty 2
  Filled 2018-10-07 (×2): qty 1
  Filled 2018-10-07: qty 2
  Filled 2018-10-07 (×3): qty 1
  Filled 2018-10-07: qty 2

## 2018-10-07 MED ORDER — OXYCODONE HCL 5 MG PO TABS: 5.0000 mg | ORAL_TABLET | Freq: Once | ORAL | Status: DC | PRN

## 2018-10-07 MED ORDER — FENTANYL CITRATE (PF) 100 MCG/2ML IJ SOLN
25.0000 ug | INTRAMUSCULAR | Status: DC | PRN
  Administered 2018-10-07 (×4): 25 ug via INTRAVENOUS

## 2018-10-07 MED ORDER — LIDOCAINE 2% (20 MG/ML) 5 ML SYRINGE
INTRAMUSCULAR | Status: DC | PRN
  Administered 2018-10-07: 60 mg via INTRAVENOUS

## 2018-10-07 MED ORDER — PROPOFOL 10 MG/ML IV BOLUS
INTRAVENOUS | Status: DC | PRN
  Administered 2018-10-07: 80 mg via INTRAVENOUS

## 2018-10-07 MED ORDER — ACETAMINOPHEN 160 MG/5ML PO SOLN: 1000.0000 mg | Freq: Once | ORAL | Status: DC | PRN

## 2018-10-07 MED ORDER — EPHEDRINE 5 MG/ML INJ
INTRAVENOUS | Status: AC
  Filled 2018-10-07: qty 10

## 2018-10-07 MED ORDER — METOCLOPRAMIDE HCL 5 MG PO TABS: 5.0000 mg | ORAL_TABLET | Freq: Three times a day (TID) | ORAL | Status: DC | PRN

## 2018-10-07 MED ORDER — ONDANSETRON HCL 4 MG/2ML IJ SOLN
INTRAMUSCULAR | Status: AC
  Filled 2018-10-07: qty 2

## 2018-10-07 MED ORDER — PROPOFOL 10 MG/ML IV BOLUS
INTRAVENOUS | Status: AC
  Filled 2018-10-07: qty 40

## 2018-10-07 MED ORDER — HYDROCODONE-ACETAMINOPHEN 7.5-325 MG PO TABS
1.0000 | ORAL_TABLET | ORAL | Status: DC | PRN
  Administered 2018-10-08 – 2018-10-09 (×2): 1 via ORAL
  Administered 2018-10-09: 2 via ORAL
  Administered 2018-10-09: 1 via ORAL
  Administered 2018-10-10: 2 via ORAL
  Administered 2018-10-10 (×2): 1 via ORAL
  Administered 2018-10-11 (×2): 2 via ORAL
  Filled 2018-10-07: qty 1
  Filled 2018-10-07: qty 2
  Filled 2018-10-07 (×3): qty 1
  Filled 2018-10-07 (×3): qty 2
  Filled 2018-10-07: qty 1

## 2018-10-07 MED ORDER — SODIUM CHLORIDE 0.9 % IR SOLN
Status: DC | PRN
  Administered 2018-10-07: 1000 mL

## 2018-10-07 MED ORDER — ACETAMINOPHEN 10 MG/ML IV SOLN: 1000.0000 mg | Freq: Once | INTRAVENOUS | Status: DC | PRN

## 2018-10-07 SURGICAL SUPPLY — 64 items
ADH SKN CLS APL DERMABOND .7 (GAUZE/BANDAGES/DRESSINGS) ×2
BAG DECANTER FOR FLEXI CONT (MISCELLANEOUS) IMPLANT
BAG SPEC THK2 15X12 ZIP CLS (MISCELLANEOUS)
BAG ZIPLOCK 12X15 (MISCELLANEOUS) IMPLANT
BLADE SURG SZ10 CARB STEEL (BLADE) ×4 IMPLANT
CHLORAPREP W/TINT 26ML (MISCELLANEOUS) ×3 IMPLANT
CLOTH BEACON ORANGE TIMEOUT ST (SAFETY) ×2 IMPLANT
COVER PERINEAL POST (MISCELLANEOUS) ×2 IMPLANT
COVER SURGICAL LIGHT HANDLE (MISCELLANEOUS) ×2 IMPLANT
COVER WAND RF STERILE (DRAPES) IMPLANT
CUP ACET PNNCL SECTR W/GRIP 56 (Hips) IMPLANT
DECANTER SPIKE VIAL GLASS SM (MISCELLANEOUS) ×3 IMPLANT
DERMABOND ADVANCED (GAUZE/BANDAGES/DRESSINGS) ×2
DERMABOND ADVANCED .7 DNX12 (GAUZE/BANDAGES/DRESSINGS) ×2 IMPLANT
DRAPE IMP U-DRAPE 54X76 (DRAPES) ×2 IMPLANT
DRAPE SHEET LG 3/4 BI-LAMINATE (DRAPES) ×6 IMPLANT
DRAPE STERI IOBAN 125X83 (DRAPES) ×2 IMPLANT
DRAPE U-SHAPE 47X51 STRL (DRAPES) ×4 IMPLANT
DRESSING AQUACEL AG SP 3.5X10 (GAUZE/BANDAGES/DRESSINGS) IMPLANT
DRSG AQUACEL AG ADV 3.5X10 (GAUZE/BANDAGES/DRESSINGS) ×2 IMPLANT
DRSG AQUACEL AG SP 3.5X10 (GAUZE/BANDAGES/DRESSINGS) ×2
ELECT BLADE TIP CTD 4 INCH (ELECTRODE) ×2 IMPLANT
ELECT PENCIL ROCKER SW 15FT (MISCELLANEOUS) ×2 IMPLANT
ELECT REM PT RETURN 15FT ADLT (MISCELLANEOUS) ×2 IMPLANT
GAUZE SPONGE 4X4 12PLY STRL (GAUZE/BANDAGES/DRESSINGS) ×2 IMPLANT
GLOVE BIO SURGEON STRL SZ8.5 (GLOVE) ×4 IMPLANT
GLOVE BIOGEL PI IND STRL 8.5 (GLOVE) ×1 IMPLANT
GLOVE BIOGEL PI INDICATOR 8.5 (GLOVE) ×1
GOWN SPEC L3 XXLG W/TWL (GOWN DISPOSABLE) ×2 IMPLANT
HANDPIECE INTERPULSE COAX TIP (DISPOSABLE) ×2
HEAD M SROM 36MM PLUS 1.5 (Hips) IMPLANT
HOLDER FOLEY CATH W/STRAP (MISCELLANEOUS) ×2 IMPLANT
HOOD PEEL AWAY FLYTE STAYCOOL (MISCELLANEOUS) ×8 IMPLANT
KIT TURNOVER KIT A (KITS) IMPLANT
LINER NEUTRAL 52MMX36MMX56N (Liner) ×1 IMPLANT
MANIFOLD NEPTUNE II (INSTRUMENTS) ×2 IMPLANT
MARKER SKIN DUAL TIP RULER LAB (MISCELLANEOUS) ×2 IMPLANT
NDL SAFETY ECLIPSE 18X1.5 (NEEDLE) ×1 IMPLANT
NDL SPNL 18GX3.5 QUINCKE PK (NEEDLE) ×1 IMPLANT
NEEDLE HYPO 18GX1.5 SHARP (NEEDLE) ×2
NEEDLE SPNL 18GX3.5 QUINCKE PK (NEEDLE) ×2 IMPLANT
PACK ANTERIOR HIP CUSTOM (KITS) ×2 IMPLANT
PINN SECTOR W/GRIP ACE CUP 56 (Hips) ×2 IMPLANT
SAW OSC TIP CART 19.5X105X1.3 (SAW) ×2 IMPLANT
SEALER BIPOLAR AQUA 6.0 (INSTRUMENTS) ×2 IMPLANT
SET HNDPC FAN SPRY TIP SCT (DISPOSABLE) ×1 IMPLANT
SROM M HEAD 36MM PLUS 1.5 (Hips) ×2 IMPLANT
STAPLER VISISTAT 35W (STAPLE) ×1 IMPLANT
STEM TRI LOC GRIPTION SZ 9 STD IMPLANT
SUT ETHIBOND NAB CT1 #1 30IN (SUTURE) ×4 IMPLANT
SUT MNCRL AB 3-0 PS2 18 (SUTURE) ×2 IMPLANT
SUT MNCRL AB 4-0 PS2 18 (SUTURE) ×2 IMPLANT
SUT MON AB 2-0 CT1 36 (SUTURE) ×4 IMPLANT
SUT STRATAFIX PDO 1 14 VIOLET (SUTURE) ×2
SUT STRATFX PDO 1 14 VIOLET (SUTURE) ×1
SUT VIC AB 2-0 CT1 27 (SUTURE) ×2
SUT VIC AB 2-0 CT1 TAPERPNT 27 (SUTURE) ×1 IMPLANT
SUTURE STRATFX PDO 1 14 VIOLET (SUTURE) ×1 IMPLANT
SYR 3ML LL SCALE MARK (SYRINGE) ×5 IMPLANT
SYR 50ML LL SCALE MARK (SYRINGE) ×2 IMPLANT
TRAY FOLEY MTR SLVR 16FR STAT (SET/KITS/TRAYS/PACK) IMPLANT
TRI LOC GRIPTION SZ 9 STD ×2 IMPLANT
WATER STERILE IRR 1000ML POUR (IV SOLUTION) ×2 IMPLANT
YANKAUER SUCT BULB TIP 10FT TU (MISCELLANEOUS) ×2 IMPLANT

## 2018-10-07 NOTE — Op Note (Signed)
OPERATIVE REPORT  SURGEON: Rod Can, MD   ASSISTANT: Staff.  PREOPERATIVE DIAGNOSIS: Displaced Right femoral neck fracture.   POSTOPERATIVE DIAGNOSIS: Displaced Right femoral neck fracture.   PROCEDURE: Right total hip arthroplasty, anterior approach.   IMPLANTS: DePuy Tri Lock stem, size 9, std offset. DePuy Pinnacle Cup, size 56 mm. DePuy Altrx liner, size 36 by 56 mm, neutral. DePuy metal head ball, size 36 + 1.5 mm.  ANESTHESIA:  General  ESTIMATED BLOOD LOSS:-400 mL    ANTIBIOTICS: 2 g Ancef.  DRAINS: None.  COMPLICATIONS: None.   CONDITION: PACU - hemodynamically stable.   BRIEF CLINICAL NOTE: Carl [name] is a 83 y.o. male with a comminuted, displaced Right femoral neck fracture.  The patient was admitted to the hospitalist service for perioperative medical risk stratification and medical optimization. He takes eliquis for atrial fibrillation, so surgery was delayed greater than 48 hours from previous dose. The risks, benefits, and alternatives to the procedure were explained, and the patient elected to proceed.  PROCEDURE IN DETAIL: Surgical site was marked by myself in the pre-op holding area. Once inside the operating room, general anesthesia was obtained, and a foley catheter was inserted. The patient was then positioned on the Hana table. All bony prominences were well padded. The hip was prepped and draped in the normal sterile surgical fashion. A time-out was called verifying side and site of surgery. The patient received IV antibiotics within 60 minutes of beginning the procedure.  The direct anterior approach to the hip was performed through the Hueter interval. Lateral femoral circumflex vessels were treated with the Auqumantys. The anterior capsule was exposed and an inverted T capsulotomy was made. Fracture hematoma was encountered and evacuated. The patient was found to have a comminuted Right subcapital femoral neck fracture. I  freshened the femoral neck cut with a saw. I removed the femoral neck fragment. A corkscrew was placed into the head and the head was removed. This was passed to the back table and was measured.  Acetabular exposure was achieved, and the pulvinar and labrum were excised. Sequential reaming of the acetabulum was then performed up to a size 55 mm reamer. A 56 mm cup was then opened and impacted into place at approximately 40 degrees of abduction and 20 degrees of anteversion. The final polyethylene liner was impacted into place and acetabular osteophytes were removed.   I then gained femoral exposure taking care to protect the abductors and greater trochanter. This was performed using standard external rotation, extension, and adduction. The capsule was peeled off the inner aspect of the greater trochanter, taking care to preserve the short external rotators. A cookie cutter was used to enter the femoral canal, and then the femoral canal finder was placed. Sequential broaching was performed up to a size 9. Calcar planer was used on the femoral neck remnant. I placed a std offset neck and a trial head ball. The hip was reduced. Leg lengths and offset were checked fluoroscopically. The hip was dislocated and trial components were removed. The final implants were placed, and the hip was reduced.  Fluoroscopy was used to confirm component position and leg lengths. At 90 degrees of external rotation and full extension, the hip was stable to an anterior directed force.  The wound was copiously irrigated with normal saline using pulse lavage. Marcaine solution was injected into the periarticular soft tissue. The wound was closed in layers using #1 Vicryl and Stratafix for the fascia, 2-0 Vicryl for the subcutaneous fat, 2-0 Monocryl  for the deep dermal layer, and staples plus Dermabond for the skin. Once the glue was fully dried, an Aquacell Ag dressing was applied. The patient was transported to  the recovery room in stable condition. Sponge, needle, and instrument counts were correct at the end of the case x2. The patient tolerated the procedure well and there were no known complications.

## 2018-10-07 NOTE — Plan of Care (Signed)
  Problem: Pain Management: Goal: Pain level will decrease Outcome: Progressing   Problem: Activity: Goal: Risk for activity intolerance will decrease Outcome: Progressing   Problem: Nutrition: Goal: Adequate nutrition will be maintained Outcome: Progressing   Problem: Pain Managment: Goal: General experience of comfort will improve Outcome: Progressing

## 2018-10-07 NOTE — Progress Notes (Signed)
PROGRESS NOTE    Carl [name]  YTK:[id number] DOB: 1936-07-18 DOA: 10/05/2018 PCP: [name], MD    Brief Narrative:  83 yr old man who carries a past medical history significant for AAA, arthritis, bladder calculus, Epidural hemorrhage, chronic anemia, anxiety, CKD Stage III, CAD s/p CABG in 2006, DOE, GERD, Hematuria, Recurrent UTI's, Lung nodule that is followed by his PCP, Diastolic CHF, Hyperlipidemia, Hypertension, BPH, paroxysmal atrial fibrillation for which the patient is on eliquis, renal calculi, and recurrent prostate cancer.  The patient states that he has felt weaker than usual lately. He fell once last night and again this morning. He was able to get up on his own last night, but not this morning. He was brought to the ED by EMS. He was found to have a mildly impacted acute fracture of the mid right femoral neck.   Vital signs in the ED were: Temp: 98.3. Heart rate: 72, blood pressure was 131/66. He was saturating 97% on room air.  Sodium was 139. Potassium was 3.9. Choride was 110. BUN was 22. Creatinine was 0.99. WBC was 8.2 . Hemoglobin 10.5. Platelets was 143.   CT of his head demonstrates a small area of either epidural hematoma or loculated subdural hematoma without mass effect or signs of acute bleeding. The patient has been on Eliquis at home for his atrial fibrillation.  The hospitalists were consulted to admit the patient for further evaluation and plan. Neurosurgery and orthopedic surgery were consulted from the ED.   Assessment & Plan:   Active Problems:   Hx of CABG x 5 '06   Essential hypertension   Cardiomyopathy, ischemic   Chronic kidney disease (CKD), stage III (moderate) (HCC)   Persistent atrial fibrillation   Acute congestive heart failure ([place])   Fall   Physical deconditioning   Hip fracture (HCC)   Intracranial hematoma (HCC)   Left hip fracture:  -Orthopedic surgery consulted. For surgery 3/12 as pt was on eliquis prior  to admit -Remains stable this AM, denies acute pain  Intracranial hematoma:  -The patient has been evaluated by neurosurgery, who recommended repeat CT head today for interval change, now ordered and pending -Neurologically intact -Repeat CT head ordered and reviewed. Unchanged findings  Persistent atrial fibrillation: -Continue on home amiodarone -Continue patient on tele  Chronic Systolic Congestive Heart Failure/Ischemic cardiomyopathy:  -The patient's most recent echocardiogram was in 2018 and revealed an EF of 50%. There was no evaluation for diastolic function. There was a severely dilated left atrial appendage.  -Currently not on ACE I due his elevated creatinine in the past.  -Stable at present  CKD III:  -The patient presented with GFR of 60 -recheck bmet in AM  DVT prophylaxis: SCD's Code Status: Full Family Communication: Pt in room, family at bedside Disposition Plan: Uncertain at this time  Consultants:   Orthopedic surgery  Neurosurgery  Procedures:     Antimicrobials: Anti-infectives (From admission, onward)   Start     Dose/Rate Route Frequency Ordered Stop   10/07/18 1245  ceFAZolin (ANCEF) IVPB 2g/100 mL premix     2 g 200 mL/hr over 30 Minutes Intravenous On call to O.R. 10/06/18 1442 10/07/18 1444   10/06/18 0600  ceFAZolin (ANCEF) IVPB 2g/100 mL premix     2 g 200 mL/hr over 30 Minutes Intravenous On call to O.R. 10/05/18 2149 10/07/18 0559      Subjective: Eager to have surgery. Asking for ice chips  Objective: Vitals:   10/06/18 1335  10/06/18 1731 10/06/18 2119 10/07/18 0522  BP: 120/60 131/72 127/75 129/68  Pulse: 83 70 81 72  Resp: 16 16 17 16   Temp: 98 F (36.7 C) 98 F (36.7 C) 98.6 F (37 C) 98.3 F (36.8 C)  TempSrc: Oral Oral Oral Oral  SpO2: 94% 97% 96% 96%  Weight:      Height:        Intake/Output Summary (Last 24 hours) at 10/07/2018 1536 Last data filed at 10/07/2018 1526 Gross per 24 hour  Intake 1708.25 ml   Output 1675 ml  Net 33.25 ml   Filed Weights   10/05/18 1913  Weight: 74.1 kg    Examination: General exam: Awake, laying in bed, in nad Respiratory system: Normal respiratory effort, no wheezing Cardiovascular system: regular rate, s1, s2 Gastrointestinal system: Soft, nondistended, positive BS Central nervous system: CN2-12 grossly intact, strength intact Extremities: Perfused, no clubbing Skin: Normal skin turgor, no notable skin lesions seen Psychiatry: Mood normal // no visual hallucinations   Data Reviewed: I have personally reviewed following labs and imaging studies  CBC: Recent Labs  Lab 10/03/18 1815 10/05/18 1350  WBC 5.4 8.2  NEUTROABS 3.9 7.1  HGB 10.8* 10.5*  HCT 33.8* 33.0*  MCV 98.5 101.5*  PLT 168 409*   Basic Metabolic Panel: Recent Labs  Lab 10/03/18 1815 10/05/18 1350  NA 140 139  K 3.7 3.9  CL 111 110  CO2 23 24  GLUCOSE 120* 114*  BUN 23 22  CREATININE 1.16 0.99  CALCIUM 8.0* 8.2*   GFR: Estimated Creatinine Clearance: 62.4 mL/min (by C-G formula based on SCr of 0.99 mg/dL). Liver Function Tests: Recent Labs  Lab 10/03/18 1815 10/05/18 1350  AST 20 20  ALT 18 25  ALKPHOS 64 66  BILITOT 0.5 0.5  PROT 5.5* 5.7*  ALBUMIN 2.9* 3.0*   No results for input(s): LIPASE, AMYLASE in the last 168 hours. No results for input(s): AMMONIA in the last 168 hours. Coagulation Profile: No results for input(s): INR, PROTIME in the last 168 hours. Cardiac Enzymes: No results for input(s): CKTOTAL, CKMB, CKMBINDEX, TROPONINI in the last 168 hours. BNP (last 3 results) No results for input(s): PROBNP in the last 8760 hours. HbA1C: No results for input(s): HGBA1C in the last 72 hours. CBG: No results for input(s): GLUCAP in the last 168 hours. Lipid Profile: No results for input(s): CHOL, HDL, LDLCALC, TRIG, CHOLHDL, LDLDIRECT in the last 72 hours. Thyroid Function Tests: No results for input(s): TSH, T4TOTAL, FREET4, T3FREE, THYROIDAB in  the last 72 hours. Anemia Panel: No results for input(s): VITAMINB12, FOLATE, FERRITIN, TIBC, IRON, RETICCTPCT in the last 72 hours. Sepsis Labs: No results for input(s): PROCALCITON, LATICACIDVEN in the last 168 hours.  Recent Results (from the past 240 hour(s))  MRSA PCR Screening     Status: None   Collection Time: 10/05/18 10:44 PM  Result Value Ref Range Status   MRSA by PCR NEGATIVE NEGATIVE Final    Comment:        The GeneXpert MRSA Assay (FDA approved for NASAL specimens only), is one component of a comprehensive MRSA colonization surveillance program. It is not intended to diagnose MRSA infection nor to guide or monitor treatment for MRSA infections. Performed at [facility], [place] [address] [place], [place] [address]      Radiology Studies: Ct Head Wo Contrast  Result Date: 10/06/2018 CLINICAL DATA:  Headache EXAM: CT HEAD WITHOUT CONTRAST TECHNIQUE: Contiguous axial images were obtained from the base  of the skull through the vertex without intravenous contrast. COMPARISON:  10/05/2018 head CT FINDINGS: Brain: There is an unchanged extra-axial mass or collection of the left frontal convexity, measuring 25 x 9 mm. No abnormality of the underlying brain. No acute intracranial hemorrhage. There is periventricular hypoattenuation compatible with chronic microvascular disease. Vascular: No abnormal hyperdensity of the major intracranial arteries or dural venous sinuses. No intracranial atherosclerosis. Skull: The visualized skull base, calvarium and extracranial soft tissues are normal. Sinuses/Orbits: Partial opacification of the left frontal and ethmoid sinuses. The orbits are normal. IMPRESSION: 1. Unchanged appearance of extra-axial lesion along the left frontal convexity, favored to be a chronic, encapsulated subdural or epidural hematoma. Meningioma is an alternative consideration. MRI with and without contrast or CT with contrast should be able to  differentiate. 2. Chronic small vessel ischemia.  No acute abnormality. Electronically Signed   By: Ulyses Jarred M.D.   On: 10/06/2018 15:55    Scheduled Meds: . [MAR Hold] amiodarone  200 mg Oral Daily  . [MAR Hold] bacitracin  1 application Topical BID  . chlorhexidine  60 mL Topical Once  . [MAR Hold] feeding supplement (ENSURE ENLIVE)  237 mL Oral BID BM  . [MAR Hold] pantoprazole  40 mg Oral Daily  . [MAR Hold] polyethylene glycol  17 g Oral Daily  . povidone-iodine  2 application Topical Once  . povidone-iodine  2 application Topical Once  . [MAR Hold] rosuvastatin  40 mg Oral Daily  . [MAR Hold] senna-docusate  1 tablet Oral BID  . [MAR Hold] tamsulosin  0.4 mg Oral QPC supper   Continuous Infusions: . sodium chloride 50 mL/hr at 10/07/18 0633  . [MAR Hold] methocarbamol (ROBAXIN) IV       LOS: 2 days   Marylu Lund, MD [facility] Pager On Amion  If 7PM-7AM, please contact night-coverage 10/07/2018, 3:36 PM   \

## 2018-10-07 NOTE — Evaluation (Signed)
SLP Cancellation Note  Patient Details Name: Carl [name] MRN: [id number] DOB: 05-28-1936   Cancelled treatment:       Reason Eval/Treat Not Completed: Other (comment)(pt npo for surgery, will continue efforts)   Macario Golds 10/07/2018, 7:26 AM  Luanna Salk, [facility] SLP Acute Rehab Services Pager [phone] Office [phone]

## 2018-10-07 NOTE — Anesthesia Preprocedure Evaluation (Addendum)
Anesthesia Evaluation  Patient identified by MRN, date of birth, ID band Patient awake    Reviewed: Allergy & Precautions, NPO status , Patient's Chart, lab work & pertinent test results  Airway Mallampati: III  TM Distance: >3 FB Neck ROM: Full  Mouth opening: Limited Mouth Opening  Dental no notable dental hx. (+) Poor Dentition,    Pulmonary former smoker,    Pulmonary exam normal breath sounds clear to auscultation       Cardiovascular hypertension, + CAD, + CABG (2006), + Peripheral Vascular Disease (AAA 3.1cm), +CHF and + DOE  Normal cardiovascular exam+ dysrhythmias (on eliquis) Atrial Fibrillation + Valvular Problems/Murmurs MR  Rhythm:Regular Rate:Normal  TTE 2018 EF 50-55%, mod MR   Neuro/Psych PSYCHIATRIC DISORDERS Anxiety negative neurological ROS     GI/Hepatic Neg liver ROS, GERD  ,  Endo/Other  negative endocrine ROS  Renal/GU Renal InsufficiencyRenal disease  negative genitourinary   Musculoskeletal  (+) Arthritis ,   Abdominal   Peds negative pediatric ROS (+)  Hematology  (+) Blood dyscrasia, anemia ,   Anesthesia Other Findings   Reproductive/Obstetrics negative OB ROS                            Anesthesia Physical Anesthesia Plan  ASA: III  Anesthesia Plan: General   Post-op Pain Management:    Induction: Intravenous  PONV Risk Score and Plan: 2 and Ondansetron, Dexamethasone and Treatment may vary due to age or medical condition  Airway Management Planned: Oral ETT  Additional Equipment:   Intra-op Plan:   Post-operative Plan: Extubation in OR  Informed Consent: I have reviewed the patients History and Physical, chart, labs and discussed the procedure including the risks, benefits and alternatives for the proposed anesthesia with the patient or authorized representative who has indicated his/her understanding and acceptance.     Dental advisory  given  Plan Discussed with: CRNA  Anesthesia Plan Comments:         Anesthesia Quick Evaluation

## 2018-10-07 NOTE — Transfer of Care (Signed)
Immediate Anesthesia Transfer of Care Note  Patient: Carl [name]  Procedure(s) Performed: TOTAL HIP ARTHROPLASTY ANTERIOR APPROACH (Right Hip)  Patient Location: PACU  Anesthesia Type:General  Level of Consciousness: awake, alert  and oriented  Airway & Oxygen Therapy: Patient Spontanous Breathing and Patient connected to face mask oxygen  Post-op Assessment: Report given to RN and Post -op Vital signs reviewed and stable  Post vital signs: Reviewed and stable  Last Vitals:  Vitals Value Taken Time  BP 104/61 10/07/2018  4:00 PM  Temp    Pulse 87 10/07/2018  4:01 PM  Resp 14 10/07/2018  4:01 PM  SpO2 100 % 10/07/2018  4:01 PM  Vitals shown include unvalidated device data.  Last Pain:  Vitals:   10/07/18 0658  TempSrc:   PainSc: Asleep      Patients Stated Pain Goal: 3 (25/08/71 9941)  Complications: No apparent anesthesia complications

## 2018-10-07 NOTE — Anesthesia Procedure Notes (Signed)
Procedure Name: Intubation Date/Time: 10/07/2018 2:13 PM Performed by: Niel Hummer, CRNA Pre-anesthesia Checklist: Patient being monitored, Suction available, Emergency Drugs available and Patient identified Oxygen Delivery Method: Circle system utilized Preoxygenation: Pre-oxygenation with 100% oxygen Induction Type: IV induction Ventilation: Mask ventilation without difficulty Laryngoscope Size: Mac and 4 Grade View: Grade I Tube type: Oral Tube size: 7.5 mm Number of attempts: 1 Airway Equipment and Method: Stylet Placement Confirmation: ETT inserted through vocal cords under direct vision,  positive ETCO2 and breath sounds checked- equal and bilateral Secured at: 23 cm Tube secured with: Tape Dental Injury: Teeth and Oropharynx as per pre-operative assessment

## 2018-10-07 NOTE — Discharge Instructions (Signed)
°Dr. Orpha Dain °Joint Replacement Specialist °[facility] °[address] °[place], [place] [address] °[phone] ° ° °TOTAL HIP REPLACEMENT POSTOPERATIVE DIRECTIONS ° ° ° °Hip Rehabilitation, Guidelines Following Surgery  ° °WEIGHT BEARING °Weight bearing as tolerated with assist device (walker, cane, etc) as directed, use it as long as suggested by your surgeon or therapist, typically at least 4-6 weeks. ° °The results of a hip operation are greatly improved after range of motion and muscle strengthening exercises. Follow all safety measures which are given to protect your hip. If any of these exercises cause increased pain or swelling in your joint, decrease the amount until you are comfortable again. Then slowly increase the exercises. Call your caregiver if you have problems or questions.  ° °HOME CARE INSTRUCTIONS  °Most of the following instructions are designed to prevent the dislocation of your new hip.  °Remove items at home which could result in a fall. This includes throw rugs or furniture in walking pathways.  °Continue medications as instructed at time of discharge. °· You may have some home medications which will be placed on hold until you complete the course of blood thinner medication. °· You may start showering once you are discharged home. Do not remove your dressing. °Do not put on socks or shoes without following the instructions of your caregivers.   °Sit on chairs with arms. Use the chair arms to help push yourself up when arising.  °Arrange for the use of a toilet seat elevator so you are not sitting low.  °· Walk with walker as instructed.  °You may resume a sexual relationship in one month or when given the OK by your caregiver.  °Use walker as long as suggested by your caregivers.  °You may put full weight on your legs and walk as much as is comfortable. °Avoid periods of inactivity such as sitting longer than an hour when not asleep. This helps prevent  blood clots.  °You may return to work once you are cleared by your surgeon.  °Do not drive a car for 6 weeks or until released by your surgeon.  °Do not drive while taking narcotics.  °Wear elastic stockings for two weeks following surgery during the day but you may remove then at night.  °Make sure you keep all of your appointments after your operation with all of your doctors and caregivers. You should call the office at the above phone number and make an appointment for approximately two weeks after the date of your surgery. °Please pick up a stool softener and laxative for home use as long as you are requiring pain medications. °· ICE to the affected hip every three hours for 30 minutes at a time and then as needed for pain and swelling. Continue to use ice on the hip for pain and swelling from surgery. You may notice swelling that will progress down to the foot and ankle.  This is normal after surgery.  Elevate the leg when you are not up walking on it.   °It is important for you to complete the blood thinner medication as prescribed by your doctor. °· Continue to use the breathing machine which will help keep your temperature down.  It is common for your temperature to cycle up and down following surgery, especially at night when you are not up moving around and exerting yourself.  The breathing machine keeps your lungs expanded and your temperature down. ° °RANGE OF MOTION AND STRENGTHENING EXERCISES  °These exercises are   designed to help you keep full movement of your hip joint. Follow your caregiver's or physical therapist's instructions. Perform all exercises about fifteen times, three times per day or as directed. Exercise both hips, even if you have had only one joint replacement. These exercises can be done on a training (exercise) mat, on the floor, on a table or on a bed. Use whatever works the best and is most comfortable for you. Use music or television while you are exercising so that the exercises  are a pleasant break in your day. This will make your life better with the exercises acting as a break in routine you can look forward to.  °Lying on your back, slowly slide your foot toward your buttocks, raising your knee up off the floor. Then slowly slide your foot back down until your leg is straight again.  °Lying on your back spread your legs as far apart as you can without causing discomfort.  °Lying on your side, raise your upper leg and foot straight up from the floor as far as is comfortable. Slowly lower the leg and repeat.  °Lying on your back, tighten up the muscle in the front of your thigh (quadriceps muscles). You can do this by keeping your leg straight and trying to raise your heel off the floor. This helps strengthen the largest muscle supporting your knee.  °Lying on your back, tighten up the muscles of your buttocks both with the legs straight and with the knee bent at a comfortable angle while keeping your heel on the floor.  ° °SKILLED REHAB INSTRUCTIONS: °If the patient is transferred to a skilled rehab facility following release from the hospital, a list of the current medications will be sent to the facility for the patient to continue.  When discharged from the skilled rehab facility, please have the facility set up the patient's Home Health Physical Therapy prior to being released. Also, the skilled facility will be responsible for providing the patient with their medications at time of release from the facility to include their pain medication and their blood thinner medication. If the patient is still at the rehab facility at time of the two week follow up appointment, the skilled rehab facility will also need to assist the patient in arranging follow up appointment in our office and any transportation needs. ° °MAKE SURE YOU:  °Understand these instructions.  °Will watch your condition.  °Will get help right away if you are not doing well or get worse. ° °Pick up stool softner and  laxative for home use following surgery while on pain medications. °Do not remove your dressing. °The dressing is waterproof--it is OK to take showers. °Continue to use ice for pain and swelling after surgery. °Do not use any lotions or creams on the incision until instructed by your surgeon. °Total Hip Protocol. ° ° °

## 2018-10-07 NOTE — Anesthesia Postprocedure Evaluation (Signed)
Anesthesia Post Note  Patient: Carl [name]  Procedure(s) Performed: TOTAL HIP ARTHROPLASTY ANTERIOR APPROACH (Right Hip)     Patient location during evaluation: PACU Anesthesia Type: General Level of consciousness: awake and alert Pain management: pain level controlled Vital Signs Assessment: post-procedure vital signs reviewed and stable Respiratory status: spontaneous breathing, nonlabored ventilation, respiratory function stable and patient connected to nasal cannula oxygen Cardiovascular status: blood pressure returned to baseline and stable Postop Assessment: no apparent nausea or vomiting Anesthetic complications: no    Last Vitals:  Vitals:   10/07/18 1710 10/07/18 1818  BP: (!) 101/56 106/60  Pulse: 86 93  Resp: 14 16  Temp: 36.8 C 36.4 C  SpO2: 96% 98%    Last Pain:  Vitals:   10/07/18 1900  TempSrc:   PainSc: 2                  [name]

## 2018-10-07 NOTE — Interval H&P Note (Signed)
History and Physical Interval Note:  10/07/2018 1:13 PM  [name]  has presented today for surgery, with the diagnosis of right femur fracture.  The various methods of treatment have been discussed with the patient and family. After consideration of risks, benefits and other options for treatment, the patient has consented to  Procedure(s): TOTAL HIP ARTHROPLASTY ANTERIOR APPROACH (Right) as a surgical intervention.  The patient's history has been reviewed, patient examined, no change in status, stable for surgery.  I have reviewed the patient's chart and labs.  Questions were answered to the patient's satisfaction.     Hilton Cork Brynnlie Unterreiner

## 2018-10-08 ENCOUNTER — Encounter (HOSPITAL_COMMUNITY): Payer: Self-pay | Admitting: Orthopedic Surgery

## 2018-10-08 LAB — BASIC METABOLIC PANEL
Anion gap: 8 (ref 5–15)
BUN: 24 mg/dL — ABNORMAL HIGH (ref 8–23)
CO2: 21 mmol/L — ABNORMAL LOW (ref 22–32)
Calcium: 7.8 mg/dL — ABNORMAL LOW (ref 8.9–10.3)
Chloride: 105 mmol/L (ref 98–111)
Creatinine, Ser: 1.26 mg/dL — ABNORMAL HIGH (ref 0.61–1.24)
GFR calc Af Amer: >60 mL/min (ref >60)
GFR calc non Af Amer: 54 mL/min — ABNORMAL LOW (ref >60)
Glucose, Bld: 167 mg/dL — ABNORMAL HIGH (ref 70–99)
Potassium: 4.2 mmol/L (ref 3.5–5.1)
Sodium: 134 mmol/L — ABNORMAL LOW (ref 135–145)

## 2018-10-08 LAB — ABO/RH: ABO/RH(D): O NEG

## 2018-10-08 LAB — CBC
HCT: 31.6 % — ABNORMAL LOW (ref 39.0–52.0)
Hemoglobin: 9.4 g/dL — ABNORMAL LOW (ref 13.0–17.0)
MCH: 31.1 pg (ref 26.0–34.0)
MCHC: 29.7 g/dL — ABNORMAL LOW (ref 30.0–36.0)
MCV: 104.6 fL — ABNORMAL HIGH (ref 80.0–100.0)
Platelets: 142 10*3/uL — ABNORMAL LOW (ref 150–400)
RBC: 3.02 MIL/uL — ABNORMAL LOW (ref 4.22–5.81)
RDW: 13.6 % (ref 11.5–15.5)
WBC: 11 10*3/uL — ABNORMAL HIGH (ref 4.0–10.5)
nRBC: 0 % (ref 0.0–0.2)

## 2018-10-08 LAB — MAGNESIUM: Magnesium: 1.7 mg/dL (ref 1.7–2.4)

## 2018-10-08 NOTE — NC FL2 (Signed)
[place] LEVEL OF CARE SCREENING TOOL     IDENTIFICATION  Patient Name: Carl [name] Birthdate: Jun 09, 1936 Sex: male Admission Date (Current Location): 10/05/2018  [facility] and [place] Number:  Herbalist and Address:  [facility],  [place], [place]      Provider Number: [id number]  Attending Physician Name and Address:  [name], MD  Relative Name and Phone Number:       Current Level of Care: Hospital Recommended Level of Care: [place] Prior Approval Number:    Date Approved/Denied:   PASRR Number:  [id number]  Discharge Plan:      Current Diagnoses: Patient Active Problem List   Diagnosis Date Noted  . Displaced fracture of right femoral neck ([place]) 10/07/2018  . Hip fracture ([place]) 10/05/2018  . Intracranial hematoma ([place]) 10/05/2018  . AAA (abdominal aortic aneurysm) ([place]) 08/26/2018  . Pseudomonas aeruginosa infection 08/06/2018  . Acute cystitis with hematuria   . Pseudomonas urinary tract infection 08/05/2018  . Hematuria 08/05/2018  . Pressure injury of skin 08/05/2018  . Kidney stones 06/30/2018  . Allergic rhinitis 04/26/2018  . Skin tear of elbow without complication 47/82/9562  . Complicated UTI (urinary tract infection) 01/05/2018  . Urolithiasis 01/05/2018  . Frequent urination 01/01/2018  . Colon polyps 09/15/2017  . GERD (gastroesophageal reflux disease) 09/15/2017  . Wrist pain, acute, right 05/25/2017  . Low back pain 03/24/2017  . Lung nodule seen on imaging study: 7 mm nodule left midlung 03/13/2017  . Physical deconditioning 03/13/2017  . Normocytic anemia 03/13/2017  . Fall 03/12/2017  . Abdominal soreness 03/12/2017  . Generalized weakness 03/12/2017  . Leukocytosis 03/12/2017  . Hyponatremia 03/12/2017  . Orthostatic hypotension 02/16/2017  . Blood in stool 09/19/2016  . Anticoagulated 09/19/2016  . History of prostate cancer 09/04/2016  .  Persistent atrial fibrillation 09/04/2016  . Acute congestive heart failure ([place]) 09/04/2016  . Cardiomyopathy, ischemic 07/16/2012  . Chronic kidney disease (CKD), stage III (moderate) ([place]) 07/16/2012  . Essential hypertension 08/05/2011  . Murmur 08/05/2011  . 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 Dyslipidemia 02/04/2011    Orientation RESPIRATION BLADDER Height & Weight     Self  Normal Continent, External catheter Weight: 163 lb 5.8 oz (74.1 kg) Height:  5\' 11"  (180.3 cm)  BEHAVIORAL SYMPTOMS/MOOD NEUROLOGICAL BOWEL NUTRITION STATUS      Continent Diet(Heart Healthy )  AMBULATORY STATUS COMMUNICATION OF NEEDS Skin   Extensive Assist Verbally Surgical wounds, PU Stage and Appropriate Care(Incision HIP) PU Stage 1 Dressing: (Sacrum )                     Personal Care Assistance Level of Assistance  Bathing, Feeding, Dressing Bathing Assistance: Maximum assistance Feeding assistance: Independent Dressing Assistance: Maximum assistance     Functional Limitations Info  Sight, Hearing, Speech Sight Info: Adequate Hearing Info: Adequate Speech Info: Adequate    SPECIAL CARE FACTORS FREQUENCY  PT (By licensed PT), OT (By licensed OT)     PT Frequency: 5x/week OT Frequency: 5x/week            Contractures Contractures Info: Not present    Additional Factors Info  Code Status, Allergies Code Status Info: Fullcode Allergies Info: Allergies: Ciprofloxacin           Current Medications (10/08/2018):  This is the current hospital active medication list Current Facility-Administered Medications  Medication Dose Route Frequency Provider Last Rate Last  Dose  . 0.9 %  sodium chloride infusion   Intravenous Continuous Rod Can, MD 50 mL/hr at 10/08/18 0530    . acetaminophen (TYLENOL) tablet 325-650 mg  325-650 mg Oral Q6H PRN Swinteck, Aaron Edelman, MD      . apixaban Arne [place]) tablet 2.5 mg  2.5 mg Oral BID Rod Can, MD   2.5 mg at 10/08/18 0939  . [START ON  10/11/2018] apixaban (ELIQUIS) tablet 5 mg  5 mg Oral BID Swinteck, Aaron Edelman, MD      . bacitracin ointment 1 application  1 application Topical BID Rod Can, MD   1 application at 12/87/86 0940  . docusate sodium (COLACE) capsule 100 mg  100 mg Oral BID Rod Can, MD   100 mg at 10/08/18 0939  . feeding supplement (ENSURE ENLIVE) (ENSURE ENLIVE) liquid 237 mL  237 mL Oral BID BM Rod Can, MD   237 mL at 10/08/18 0940  . HYDROcodone-acetaminophen (NORCO) 7.5-325 MG per tablet 1-2 tablet  1-2 tablet Oral Q4H PRN Swinteck, Aaron Edelman, MD      . HYDROcodone-acetaminophen (NORCO/VICODIN) 5-325 MG per tablet 1-2 tablet  1-2 tablet Oral Q4H PRN Rod Can, MD   1 tablet at 10/08/18 0939  . menthol-cetylpyridinium (CEPACOL) lozenge 3 mg  1 lozenge Oral PRN Swinteck, Aaron Edelman, MD       Or  . phenol (CHLORASEPTIC) mouth spray 1 spray  1 spray Mouth/Throat PRN Swinteck, Aaron Edelman, MD      . metoCLOPramide (REGLAN) tablet 5-10 mg  5-10 mg Oral Q8H PRN Swinteck, Aaron Edelman, MD       Or  . metoCLOPramide (REGLAN) injection 5-10 mg  5-10 mg Intravenous Q8H PRN Swinteck, Aaron Edelman, MD      . morphine 4 MG/ML injection 0.52-1 mg  0.52-1 mg Intravenous Q2H PRN Swinteck, Aaron Edelman, MD      . ondansetron (ZOFRAN) tablet 4 mg  4 mg Oral Q6H PRN Swinteck, Aaron Edelman, MD       Or  . ondansetron (ZOFRAN) injection 4 mg  4 mg Intravenous Q6H PRN Swinteck, Aaron Edelman, MD      . opium-belladonna (B&O SUPPRETTES) 16.2-60 MG suppository 1 suppository  1 suppository Rectal Q6H PRN Swinteck, Aaron Edelman, MD      . pantoprazole (PROTONIX) EC tablet 40 mg  40 mg Oral Daily Rod Can, MD   40 mg at 10/08/18 0940  . polyethylene glycol (MIRALAX / GLYCOLAX) packet 17 g  17 g Oral Daily PRN Swinteck, Aaron Edelman, MD      . sodium chloride flush (NS) 0.9 % injection 10-40 mL  10-40 mL Intracatheter PRN Patrecia Pour, MD   10 mL at 10/08/18 0535  . sorbitol 70 % solution 30 mL  30 mL Oral Daily PRN Swinteck, Aaron Edelman, MD      . tamsulosin (FLOMAX) capsule  0.4 mg  0.4 mg Oral QPC supper Rod Can, MD   0.4 mg at 10/06/18 1706     Discharge Medications: Please see discharge summary for a list of discharge medications.  Relevant Imaging Results:  Relevant Lab Results:   Additional Information [id number]  Lia Hopping, LCSW

## 2018-10-08 NOTE — Evaluation (Signed)
Physical Therapy Evaluation Patient Details Name: Carl [name] MRN: [id number] DOB: 1936/01/27 Today's Date: 10/08/2018   History of Present Illness  Pt s/p falls with R hip fx s/p anterior direct THR.  Pt with hx of CAD, A-fib, and Prostate CA  Clinical Impression  Pt s/p R THR and presents with decreased R LE strength/ROM, post op pain and premorbid deconditioning limiting functional mobility.  Pt hopes to progress to dc home with family assist.    Follow Up Recommendations Follow surgeon's recommendation for DC plan and follow-up therapies    Equipment Recommendations  None recommended by PT    Recommendations for Other Services       Precautions / Restrictions Precautions Precautions: Fall Restrictions Weight Bearing Restrictions: No Other Position/Activity Restrictions: WBAT      Mobility  Bed Mobility Overal bed mobility: Needs Assistance Bed Mobility: Supine to Sit     Supine to sit: Mod assist     General bed mobility comments: increased time with cues for sequence and use of L LE to self assist  Transfers Overall transfer level: Needs assistance   Transfers: Sit to/from Stand Sit to Stand: Min assist;Mod assist;From elevated surface         General transfer comment: cues for LE management and use of UEs to self assist  Ambulation/Gait Ambulation/Gait assistance: Min assist;Mod assist Gait Distance (Feet): 100 Feet Assistive device: Rolling walker (2 wheeled) Gait Pattern/deviations: Step-to pattern;Step-through pattern;Decreased step length - right;Decreased step length - left;Shuffle;Trunk flexed     General Gait Details: cues for posture, initial sequence, position from RW, safety awareness and to slow pace  Stairs            Wheelchair Mobility    Modified Rankin (Stroke Patients Only)       Balance Overall balance assessment: Needs assistance Sitting-balance support: No upper extremity supported;Feet supported Sitting  balance-Leahy Scale: Good     Standing balance support: Bilateral upper extremity supported Standing balance-Leahy Scale: Poor                               Pertinent Vitals/Pain Pain Assessment: 0-10 Pain Score: 6  Pain Location: R hip Pain Descriptors / Indicators: Aching;Sore Pain Intervention(s): Limited activity within patient's tolerance;Monitored during session;Premedicated before session;Ice applied    Home Living Family/patient expects to be discharged to:: Private residence Living Arrangements: Children Available Help at Discharge: Family Type of Home: House Home Access: Stairs to enter Entrance Stairs-Rails: Right Entrance Stairs-Number of Steps: 6 Home Layout: One level Home Equipment: Shower seat;Cane - single point;Walker - 2 wheels      Prior Function Level of Independence: Independent         Comments: reports he does not move around much at home, sleeps in recliner, does not amb with RW     Hand Dominance        Extremity/Trunk Assessment   Upper Extremity Assessment Upper Extremity Assessment: Generalized weakness    Lower Extremity Assessment Lower Extremity Assessment: RLE deficits/detail RLE Deficits / Details: 2+/5 strength at hip with AAROM at hip to 90 flex and 15 abd    Cervical / Trunk Assessment Cervical / Trunk Assessment: Kyphotic  Communication   Communication: No difficulties  Cognition Arousal/Alertness: Awake/alert Behavior During Therapy: WFL for tasks assessed/performed Overall Cognitive Status: Within Functional Limits for tasks assessed  General Comments      Exercises Total Joint Exercises Ankle Circles/Pumps: AROM;Both;15 reps;Supine Quad Sets: AROM;Both;5 reps;Supine Heel Slides: AAROM;Right;20 reps;Supine Hip ABduction/ADduction: AAROM;Right;15 reps;Supine   Assessment/Plan    PT Assessment Patient needs continued PT services  PT Problem  List Decreased strength;Decreased range of motion;Decreased activity tolerance;Decreased balance;Decreased mobility;Decreased knowledge of use of DME;Pain       PT Treatment Interventions DME instruction;Gait training;Stair training;Functional mobility training;Therapeutic activities;Therapeutic exercise;Patient/family education    PT Goals (Current goals can be found in the Care Plan section)  Acute Rehab PT Goals Patient Stated Goal: Regain IND to return home PT Goal Formulation: With patient Time For Goal Achievement: 10/15/18 Potential to Achieve Goals: Good    Frequency 7X/week   Barriers to discharge        Co-evaluation               AM-PAC PT "6 Clicks" Mobility  Outcome Measure Help needed turning from your back to your side while in a flat bed without using bedrails?: A Lot Help needed moving from lying on your back to sitting on the side of a flat bed without using bedrails?: A Lot Help needed moving to and from a bed to a chair (including a wheelchair)?: A Little Help needed standing up from a chair using your arms (e.g., wheelchair or bedside chair)?: A Lot Help needed to walk in hospital room?: A Little Help needed climbing 3-5 steps with a railing? : A Lot 6 Click Score: 14    End of Session Equipment Utilized During Treatment: Gait belt Activity Tolerance: Patient tolerated treatment well Patient left: in chair;with call bell/phone within reach;with chair alarm set Nurse Communication: Mobility status PT Visit Diagnosis: Difficulty in walking, not elsewhere classified (R26.2);Unsteadiness on feet (R26.81)    Time: 1120-1150 PT Time Calculation (min) (ACUTE ONLY): 30 min   Charges:   PT Evaluation $PT Eval Low Complexity: 1 Low PT Treatments $Therapeutic Exercise: 8-22 mins        Debe Coder PT Acute Rehabilitation Services Pager [phone] Office [phone]   Tandi Hanko 10/08/2018, 1:00 PM

## 2018-10-08 NOTE — Progress Notes (Signed)
    Subjective:  Patient reports pain as mild to moderate.  Denies N/V/CP/SOB. No c/o.  Objective:   VITALS:   Vitals:   10/08/18 0106 10/08/18 0518 10/08/18 0518 10/08/18 0945  BP: 96/60 (!) 104/58 (!) 104/58 (!) 103/53  Pulse: 71 75 72 74  Resp: 17 17 17 16   Temp: 98.1 F (36.7 C) 98 F (36.7 C) 98 F (36.7 C) 97.8 F (36.6 C)  TempSrc: Oral Oral Oral Oral  SpO2: 99% 95% 94% 97%  Weight:      Height:        NAD ABD soft Sensation intact distally Intact pulses distally Dorsiflexion/Plantar flexion intact Incision: dressing C/D/I Compartment soft   Lab Results  Component Value Date   WBC 11.0 (H) 10/07/2018   HGB 9.4 (L) 10/07/2018   HCT 31.6 (L) 10/07/2018   MCV 104.6 (H) 10/07/2018   PLT 142 (L) 10/07/2018   BMET    Component Value Date/Time   NA 134 (L) 10/07/2018 2340   NA 139 07/12/2018 1148   K 4.2 10/07/2018 2340   CL 105 10/07/2018 2340   CO2 21 (L) 10/07/2018 2340   GLUCOSE 167 (H) 10/07/2018 2340   BUN 24 (H) 10/07/2018 2340   BUN 24 07/12/2018 1148   CREATININE 1.26 (H) 10/07/2018 2340   CREATININE 1.68 (H) 06/18/2018 1611   CALCIUM 7.8 (L) 10/07/2018 2340   GFRNONAA 54 (L) 10/07/2018 2340   GFRAA >60 10/07/2018 2340     Assessment/Plan: 1 Day Post-Op   Active Problems:   Hx of CABG x 5 '06   Essential hypertension   Cardiomyopathy, ischemic   Chronic kidney disease (CKD), stage III (moderate) (HCC)   Persistent atrial fibrillation   Acute congestive heart failure (HCC)   Fall   Physical deconditioning   Hip fracture (HCC)   Intracranial hematoma (H[facility])   Displaced fracture of right femoral neck (H[facility])   WBAT with walker DVT ppx: Eliquis 2.5 mg PO BID for 72 hrs --> then resume normal dose, SCDs, TEDS PO pain control PT/OT Dispo: resume Eliquis 5 mg PO BID on Monday, D/C planning  Carl [name] 10/08/2018, 1:21 PM   Rod Can, MD Cell: [phone] [facility]  [place] [address], [address], [place], [place] [address] Phone: [phone] [email or web address] Facebook  [employer]

## 2018-10-08 NOTE — Progress Notes (Signed)
Physical Therapy Treatment Patient Details Name: Carl [name] MRN: [id number] DOB: 02/27/1936 Today's Date: 10/08/2018    History of Present Illness Pt s/p falls with R hip fx s/p anterior direct THR.  Pt with hx of CAD, A-fib, and Prostate CA    PT Comments    Pt very cooperative and progressing with mobility but continues to require cueing to slow for safety.   Follow Up Recommendations  Follow surgeon's recommendation for DC plan and follow-up therapies     Equipment Recommendations  None recommended by PT    Recommendations for Other Services       Precautions / Restrictions Precautions Precautions: Fall Restrictions Weight Bearing Restrictions: No Other Position/Activity Restrictions: WBAT    Mobility  Bed Mobility Overal bed mobility: Needs Assistance Bed Mobility: Supine to Sit     Supine to sit: Mod assist     General bed mobility comments: increased time with cues for sequence and use of L LE to self assist  Transfers Overall transfer level: Needs assistance Equipment used: Rolling walker (2 wheeled) Transfers: Sit to/from Stand Sit to Stand: Min assist         General transfer comment: cues for LE management and use of UEs to self assist  Ambulation/Gait Ambulation/Gait assistance: Min assist Gait Distance (Feet): 180 Feet Assistive device: Rolling walker (2 wheeled) Gait Pattern/deviations: Step-to pattern;Step-through pattern;Decreased step length - right;Decreased step length - left;Shuffle;Trunk flexed     General Gait Details: cues for posture, initial sequence, position from RW, safety awareness and to slow pace   Stairs             Wheelchair Mobility    Modified Rankin (Stroke Patients Only)       Balance Overall balance assessment: Needs assistance Sitting-balance support: No upper extremity supported;Feet supported Sitting balance-Leahy Scale: Good     Standing balance support: Bilateral upper extremity  supported Standing balance-Leahy Scale: Poor                              Cognition Arousal/Alertness: Awake/alert Behavior During Therapy: WFL for tasks assessed/performed Overall Cognitive Status: Within Functional Limits for tasks assessed                                        Exercises Total Joint Exercises Ankle Circles/Pumps: AROM;Both;15 reps;Supine Quad Sets: AROM;Both;5 reps;Supine Heel Slides: AAROM;Right;20 reps;Supine Hip ABduction/ADduction: AAROM;Right;15 reps;Supine    General Comments        Pertinent Vitals/Pain Pain Assessment: 0-10 Pain Score: 4  Pain Location: R hip Pain Descriptors / Indicators: Aching;Sore Pain Intervention(s): Limited activity within patient's tolerance;Monitored during session;Premedicated before session;Ice applied    Home Living Family/patient expects to be discharged to:: Private residence Living Arrangements: Children Available Help at Discharge: Family Type of Home: House Home Access: Stairs to enter Entrance Stairs-Rails: Right Home Layout: One level Home Equipment: Shower seat;Cane - single point;Walker - 2 wheels      Prior Function Level of Independence: Independent      Comments: reports he does not move around much at home, sleeps in recliner, does not amb with RW   PT Goals (current goals can now be found in the care plan section) Acute Rehab PT Goals Patient Stated Goal: Regain IND to return home PT Goal Formulation: With patient Time For Goal Achievement: 10/15/18 Potential to Achieve  Goals: Good Progress towards PT goals: Progressing toward goals    Frequency    7X/week      PT Plan Current plan remains appropriate    Co-evaluation              AM-PAC PT "6 Clicks" Mobility   Outcome Measure  Help needed turning from your back to your side while in a flat bed without using bedrails?: A Lot Help needed moving from lying on your back to sitting on the side of a  flat bed without using bedrails?: A Lot Help needed moving to and from a bed to a chair (including a wheelchair)?: A Little Help needed standing up from a chair using your arms (e.g., wheelchair or bedside chair)?: A Little Help needed to walk in hospital room?: A Little Help needed climbing 3-5 steps with a railing? : A Lot 6 Click Score: 15    End of Session Equipment Utilized During Treatment: Gait belt Activity Tolerance: Patient tolerated treatment well Patient left: Other (comment)(sitting EOB with alarm on to eat lunch) Nurse Communication: Mobility status PT Visit Diagnosis: Difficulty in walking, not elsewhere classified (R26.2);Unsteadiness on feet (R26.81)     Time: 9480-1655 PT Time Calculation (min) (ACUTE ONLY): 15 min  Charges:  $Gait Training: 8-22 mins $Therapeutic Exercise: 8-22 mins                     [place] Pager [phone] Office [phone]    Meryle Pugmire 10/08/2018, 2:57 PM

## 2018-10-08 NOTE — Progress Notes (Signed)
PROGRESS NOTE    Carl [name]  RJJ:[id number] DOB: [birth year] DOA: 10/05/2018 PCP: [name], MD    Brief Narrative:  83 yr old man who carries a past medical history significant for AAA, arthritis, bladder calculus, Epidural hemorrhage, chronic anemia, anxiety, CKD Stage III, CAD s/p CABG in 2006, DOE, GERD, Hematuria, Recurrent UTI's, Lung nodule that is followed by his PCP, Diastolic CHF, Hyperlipidemia, Hypertension, BPH, paroxysmal atrial fibrillation for which the patient is on eliquis, renal calculi, and recurrent prostate cancer.  The patient states that he has felt weaker than usual lately. He fell once last night and again this morning. He was able to get up on his own last night, but not this morning. He was brought to the ED by EMS. He was found to have a mildly impacted acute fracture of the mid right femoral neck.   Vital signs in the ED were: Temp: 98.3. Heart rate: 72, blood pressure was 131/66. He was saturating 97% on room air.  Sodium was 139. Potassium was 3.9. Choride was 110. BUN was 22. Creatinine was 0.99. WBC was 8.2 . Hemoglobin 10.5. Platelets was 143.   CT of his head demonstrates a small area of either epidural hematoma or loculated subdural hematoma without mass effect or signs of acute bleeding. The patient has been on Eliquis at home for his atrial fibrillation.  The hospitalists were consulted to admit the patient for further evaluation and plan. Neurosurgery and orthopedic surgery were consulted from the ED.   Assessment & Plan:   Active Problems:   Hx of CABG x 5 '06   Essential hypertension   Cardiomyopathy, ischemic   Chronic kidney disease (CKD), stage III (moderate) (HCC)   Persistent atrial fibrillation   Acute congestive heart failure ([place])   Fall   Physical deconditioning   Hip fracture (HCC)   Intracranial hematoma (HCC)   Displaced fracture of right femoral neck (HCC)   Left hip fracture:  -Orthopedic surgery  consulted. For surgery 3/12 as pt was on eliquis prior to admit -Remains stable  Intracranial hematoma:  -The patient has been evaluated by neurosurgery, who recommended repeat CT head today for interval change, now ordered and pending -Remains Neurologically intact and oriented -Repeat CT head recently ordered and reviewed.  Persistent atrial fibrillation: -Continue on home amiodarone -Continue patient on tele. Stable at present  Chronic Systolic Congestive Heart Failure/Ischemic cardiomyopathy:  -The patient's most recent echocardiogram was in 2018 and revealed an EF of 50%. There was no evaluation for diastolic function. There was a severely dilated left atrial appendage.  -Currently not on ACE I due his elevated creatinine in the past.  -Stable at present  CKD III:  -The patient presented with GFR of 60 -Repeat bmet in AM  DVT prophylaxis: heparin gtt Code Status: Full Family Communication: Pt in room, family not at bedside Disposition Plan: Uncertain at this time  Consultants:   Orthopedic surgery  Neurosurgery  Procedures:     Antimicrobials: Anti-infectives (From admission, onward)   Start     Dose/Rate Route Frequency Ordered Stop   10/07/18 2000  ceFAZolin (ANCEF) IVPB 2g/100 mL premix     2 g 200 mL/hr over 30 Minutes Intravenous Every 6 hours 10/07/18 1737 10/08/18 0135   10/07/18 1245  ceFAZolin (ANCEF) IVPB 2g/100 mL premix     2 g 200 mL/hr over 30 Minutes Intravenous On call to O.R. 10/06/18 1442 10/07/18 1444   10/06/18 0600  ceFAZolin (ANCEF) IVPB 2g/100 mL premix  Status:  Discontinued     2 g 200 mL/hr over 30 Minutes Intravenous On call to O.R. 10/05/18 2149 10/07/18 0559      Subjective: Reports pain relatively controlled this AM  Objective: Vitals:   10/08/18 0518 10/08/18 0518 10/08/18 0945 10/08/18 1406  BP: (!) 104/58 (!) 104/58 (!) 103/53 (!) 110/59  Pulse: 75 72 74 72  Resp: 17 17 16 15   Temp: 98 F (36.7 C) 98 F (36.7 C)  97.8 F (36.6 C) 97.9 F (36.6 C)  TempSrc: Oral Oral Oral Oral  SpO2: 95% 94% 97% 96%  Weight:      Height:        Intake/Output Summary (Last 24 hours) at 10/08/2018 1628 Last data filed at 10/08/2018 1459 Gross per 24 hour  Intake 2417.57 ml  Output 520 ml  Net 1897.57 ml   Filed Weights   10/05/18 1913  Weight: 74.1 kg    Examination: General exam: Conversant, in no acute distress Respiratory system: normal chest rise, clear, no audible wheezing Cardiovascular system: regular rhythm, s1-s2 Gastrointestinal system: Nondistended, nontender, pos BS Central nervous system: No seizures, no tremors Extremities: No cyanosis, no joint deformities Skin: No rashes, no pallor Psychiatry: Affect normal // no auditory hallucinations   Data Reviewed: I have personally reviewed following labs and imaging studies  CBC: Recent Labs  Lab 10/03/18 1815 10/05/18 1350 10/07/18 2340  WBC 5.4 8.2 11.0*  NEUTROABS 3.9 7.1  --   HGB 10.8* 10.5* 9.4*  HCT 33.8* 33.0* 31.6*  MCV 98.5 101.5* 104.6*  PLT 168 143* 867*   Basic Metabolic Panel: Recent Labs  Lab 10/03/18 1815 10/05/18 1350 10/07/18 2340  NA 140 139 134*  K 3.7 3.9 4.2  CL 111 110 105  CO2 23 24 21*  GLUCOSE 120* 114* 167*  BUN 23 22 24*  CREATININE 1.16 0.99 1.26*  CALCIUM 8.0* 8.2* 7.8*  MG  --   --  1.7   GFR: Estimated Creatinine Clearance: 49 mL/min (A) (by C-G formula based on SCr of 1.26 mg/dL (H)). Liver Function Tests: Recent Labs  Lab 10/03/18 1815 10/05/18 1350  AST 20 20  ALT 18 25  ALKPHOS 64 66  BILITOT 0.5 0.5  PROT 5.5* 5.7*  ALBUMIN 2.9* 3.0*   No results for input(s): LIPASE, AMYLASE in the last 168 hours. No results for input(s): AMMONIA in the last 168 hours. Coagulation Profile: No results for input(s): INR, PROTIME in the last 168 hours. Cardiac Enzymes: No results for input(s): CKTOTAL, CKMB, CKMBINDEX, TROPONINI in the last 168 hours. BNP (last 3 results) No results for  input(s): PROBNP in the last 8760 hours. HbA1C: No results for input(s): HGBA1C in the last 72 hours. CBG: No results for input(s): GLUCAP in the last 168 hours. Lipid Profile: No results for input(s): CHOL, HDL, LDLCALC, TRIG, CHOLHDL, LDLDIRECT in the last 72 hours. Thyroid Function Tests: No results for input(s): TSH, T4TOTAL, FREET4, T3FREE, THYROIDAB in the last 72 hours. Anemia Panel: No results for input(s): VITAMINB12, FOLATE, FERRITIN, TIBC, IRON, RETICCTPCT in the last 72 hours. Sepsis Labs: No results for input(s): PROCALCITON, LATICACIDVEN in the last 168 hours.  Recent Results (from the past 240 hour(s))  MRSA PCR Screening     Status: None   Collection Time: 10/05/18 10:44 PM  Result Value Ref Range Status   MRSA by PCR NEGATIVE NEGATIVE Final    Comment:        The GeneXpert MRSA Assay (FDA approved for NASAL specimens  only), is one component of a comprehensive MRSA colonization surveillance program. It is not intended to diagnose MRSA infection nor to guide or monitor treatment for MRSA infections. Performed at [facility], [place] [address] [place], [place] [address]      Radiology Studies: Pelvis Portable  Result Date: 10/07/2018 CLINICAL DATA:  Post right total hip arthroplasty. EXAM: PORTABLE PELVIS 1-2 VIEWS COMPARISON:  CT of the pelvis 10/05/2018 FINDINGS: Post total right hip arthroplasty with normal alignment of the orthopedic hardware. Expected soft tissue edema and emphysema. Skin staples seen. Stable appearance of the left hip with mild osteoarthritic changes. Stable sclerotic changes involving the intertrochanteric region of the left hip. Radioactive seed within the prostate gland. IMPRESSION: Post total right hip arthroplasty without evidence of immediate complications. Electronically Signed   By: Fidela Salisbury M.D.   On: 10/07/2018 16:29   Dg C-arm 1-60 Min-no Report  Result Date: 10/07/2018 Fluoroscopy was utilized by the  requesting physician.  No radiographic interpretation.   Dg Hip Operative Unilat With Pelvis Right  Result Date: 10/07/2018 CLINICAL DATA:  83 year old male undergoing right hip ORIF following fracture. EXAM: OPERATIVE RIGHT HIP (WITH PELVIS IF PERFORMED) 2 VIEWS TECHNIQUE: Fluoroscopic spot image(s) were submitted for interpretation post-operatively. COMPARISON:  10/05/2018 right hip series. FINDINGS: 2 intraoperative fluoroscopic AP spot views of the lower pelvis and right hip demonstrate bipolar type hip arthroplasty in place. Normal alignment in the AP view. Prostate brachytherapy redemonstrated. IMPRESSION: Intraoperative view of bipolar right hip arthroplasty with no adverse features. Electronically Signed   By: Genevie Ann M.D.   On: 10/07/2018 16:48    Scheduled Meds: . apixaban  2.5 mg Oral BID  . [START ON 10/11/2018] apixaban  5 mg Oral BID  . bacitracin  1 application Topical BID  . docusate sodium  100 mg Oral BID  . feeding supplement (ENSURE ENLIVE)  237 mL Oral BID BM  . pantoprazole  40 mg Oral Daily  . tamsulosin  0.4 mg Oral QPC supper   Continuous Infusions:    LOS: 3 days   Marylu Lund, MD [facility] Pager On Amion  If 7PM-7AM, please contact night-coverage 10/08/2018, 4:28 PM   \

## 2018-10-08 NOTE — Care Management Important Message (Signed)
Important Message  Patient Details  Name: Carl [name] MRN: [id number] Date of Birth: [birth year]   Medicare Important Message Given:  Yes    Kerin Salen 10/08/2018, 1:08 [place] Message  Patient Details  Name: Carl [name] MRN: [id number] Date of Birth: [birth year]   Medicare Important Message Given:  Yes    Kerin Salen 10/08/2018, 1:08 PM

## 2018-10-08 NOTE — Progress Notes (Signed)
Pt noted to have low BP- 80/60. MAP -67. Pt. NAD. Hospitalist Dr. Sherral Hammers was made aware. Lab work was ordered. No further orders. Pt comfortably sleeping at this time. Will continue to monitor.

## 2018-10-08 NOTE — Progress Notes (Addendum)
CSW reached to the patient son Husain and provided list of SNF offers. CSW emailed Medicare. gov list to for review.  Family chose, [facility].  PT notes pending.   Kathrin Greathouse, Marlinda Mike, MSW Clinical Social Worker  [phone] 10/08/2018  10:38 AM

## 2018-10-09 LAB — CBC
HCT: 30.1 % — ABNORMAL LOW (ref 39.0–52.0)
Hemoglobin: 9.2 g/dL — ABNORMAL LOW (ref 13.0–17.0)
MCH: 31.9 pg (ref 26.0–34.0)
MCHC: 30.6 g/dL (ref 30.0–36.0)
MCV: 104.5 fL — ABNORMAL HIGH (ref 80.0–100.0)
Platelets: 181 10*3/uL (ref 150–400)
RBC: 2.88 MIL/uL — ABNORMAL LOW (ref 4.22–5.81)
RDW: 13.5 % (ref 11.5–15.5)
WBC: 13.7 10*3/uL — ABNORMAL HIGH (ref 4.0–10.5)
nRBC: 0 % (ref 0.0–0.2)

## 2018-10-09 LAB — BASIC METABOLIC PANEL
Anion gap: 9 (ref 5–15)
BUN: 29 mg/dL — ABNORMAL HIGH (ref 8–23)
CO2: 22 mmol/L (ref 22–32)
Calcium: 8.3 mg/dL — ABNORMAL LOW (ref 8.9–10.3)
Chloride: 104 mmol/L (ref 98–111)
Creatinine, Ser: 1.35 mg/dL — ABNORMAL HIGH (ref 0.61–1.24)
GFR calc Af Amer: 57 mL/min — ABNORMAL LOW (ref >60)
GFR calc non Af Amer: 49 mL/min — ABNORMAL LOW (ref >60)
Glucose, Bld: 114 mg/dL — ABNORMAL HIGH (ref 70–99)
Potassium: 4.4 mmol/L (ref 3.5–5.1)
Sodium: 135 mmol/L (ref 135–145)

## 2018-10-09 MED ORDER — LACTATED RINGERS IV SOLN
INTRAVENOUS | Status: DC
  Administered 2018-10-09 – 2018-10-10 (×2): via INTRAVENOUS

## 2018-10-09 NOTE — Progress Notes (Signed)
Physical Therapy Treatment Patient Details Name: Carl [name] MRN: [id number] DOB: [birth year] Today's Date: 10/09/2018    History of Present Illness Pt s/p falls with R hip fx s/p anterior direct THR.  Pt with hx of CAD, A-fib, and Prostate CA    PT Comments    Pt continues cooperative but impulsive and with limited endurance.  Pt requiring cues to slow down gait speed  for safety and physical assist to prevent sitting prematurely and falling.  Follow Up Recommendations  Follow surgeon's recommendation for DC plan and follow-up therapies     Equipment Recommendations  None recommended by PT    Recommendations for Other Services       Precautions / Restrictions Precautions Precautions: Fall Restrictions Weight Bearing Restrictions: No Other Position/Activity Restrictions: WBAT    Mobility  Bed Mobility Overal bed mobility: Needs Assistance Bed Mobility: Supine to Sit;Sit to Supine     Supine to sit: Min assist Sit to supine: Min assist   General bed mobility comments: increased time with cues for sequence and use of L LE to self assist.  Transfers Overall transfer level: Needs assistance Equipment used: Rolling walker (2 wheeled) Transfers: Sit to/from Stand Sit to Stand: Min assist         General transfer comment: cues for LE management and use of UEs to self assist; On return to EOB sitting, pt attempting to sit before completing turn and backing up - pt corrected with verbal and physical cueing to avoid falling  Ambulation/Gait Ambulation/Gait assistance: Min assist Gait Distance (Feet): 180 Feet Assistive device: Rolling walker (2 wheeled) Gait Pattern/deviations: Decreased step length - right;Step-to pattern;Step-through pattern;Decreased step length - left;Shuffle;Trunk flexed     General Gait Details: cues for posture, initial sequence, position from RW, safety awareness and to slow pace   Stairs             Wheelchair Mobility     Modified Rankin (Stroke Patients Only)       Balance Overall balance assessment: Needs assistance Sitting-balance support: No upper extremity supported;Feet supported Sitting balance-Leahy Scale: Good     Standing balance support: Bilateral upper extremity supported Standing balance-Leahy Scale: Fair                              Cognition Arousal/Alertness: Awake/alert Behavior During Therapy: WFL for tasks assessed/performed;Impulsive Overall Cognitive Status: Within Functional Limits for tasks assessed                                        Exercises Total Joint Exercises Ankle Circles/Pumps: AROM;Both;15 reps;Supine Quad Sets: AROM;Both;Supine;10 reps Heel Slides: AAROM;Right;20 reps;Supine Hip ABduction/ADduction: AAROM;Right;15 reps;Supine    General Comments        Pertinent Vitals/Pain Pain Assessment: 0-10 Pain Score: 8  Pain Location: R hip Pain Descriptors / Indicators: Aching;Sore Pain Intervention(s): Limited activity within patient's tolerance;Monitored during session;Premedicated before session;Ice applied    Home Living                      Prior Function            PT Goals (current goals can now be found in the care plan section) Acute Rehab PT Goals Patient Stated Goal: Regain IND to return home PT Goal Formulation: With patient Time For Goal Achievement: 10/15/18 Potential to Achieve  Goals: Good Progress towards PT goals: Progressing toward goals    Frequency    7X/week      PT Plan Current plan remains appropriate    Co-evaluation              AM-PAC PT "6 Clicks" Mobility   Outcome Measure  Help needed turning from your back to your side while in a flat bed without using bedrails?: A Little Help needed moving from lying on your back to sitting on the side of a flat bed without using bedrails?: A Little Help needed moving to and from a bed to a chair (including a wheelchair)?: A  Little Help needed standing up from a chair using your arms (e.g., wheelchair or bedside chair)?: A Little Help needed to walk in hospital room?: A Little Help needed climbing 3-5 steps with a railing? : A Lot 6 Click Score: 17    End of Session Equipment Utilized During Treatment: Gait belt Activity Tolerance: Patient tolerated treatment well Patient left: in bed;with call bell/phone within reach Nurse Communication: Mobility status PT Visit Diagnosis: Difficulty in walking, not elsewhere classified (R26.2);Unsteadiness on feet (R26.81)     Time: 5997-7414 PT Time Calculation (min) (ACUTE ONLY): 24 min  Charges:  $Gait Training: 23-37 mins $Therapeutic Exercise: 8-22 mins                     [place] Pager [phone] Office [phone]    Rosland Riding 10/09/2018, 4:58 PM

## 2018-10-09 NOTE — Progress Notes (Signed)
PROGRESS NOTE    Carl [name]  EXB:[id number] DOB: [birth year] DOA: 10/05/2018 PCP: [name], MD    Brief Narrative:  83 yr old man who carries a past medical history significant for AAA, arthritis, bladder calculus, Epidural hemorrhage, chronic anemia, anxiety, CKD Stage III, CAD s/p CABG in 2006, DOE, GERD, Hematuria, Recurrent UTI's, Lung nodule that is followed by his PCP, Diastolic CHF, Hyperlipidemia, Hypertension, BPH, paroxysmal atrial fibrillation for which the patient is on eliquis, renal calculi, and recurrent prostate cancer.  The patient states that he has felt weaker than usual lately. He fell once last night and again this morning. He was able to get up on his own last night, but not this morning. He was brought to the ED by EMS. He was found to have a mildly impacted acute fracture of the mid right femoral neck.   Vital signs in the ED were: Temp: 98.3. Heart rate: 72, blood pressure was 131/66. He was saturating 97% on room air.  Sodium was 139. Potassium was 3.9. Choride was 110. BUN was 22. Creatinine was 0.99. WBC was 8.2 . Hemoglobin 10.5. Platelets was 143.   CT of his head demonstrates a small area of either epidural hematoma or loculated subdural hematoma without mass effect or signs of acute bleeding. The patient has been on Eliquis at home for his atrial fibrillation.  The hospitalists were consulted to admit the patient for further evaluation and plan. Neurosurgery and orthopedic surgery were consulted from the ED.   Assessment & Plan:   Active Problems:   Hx of CABG x 5 '06   Essential hypertension   Cardiomyopathy, ischemic   Chronic kidney disease (CKD), stage III (moderate) (HCC)   Persistent atrial fibrillation   Acute congestive heart failure ([place])   Fall   Physical deconditioning   Hip fracture (HCC)   Intracranial hematoma (HCC)   Displaced fracture of right femoral neck (HCC)   Left hip fracture:  -Orthopedic surgery  consulted. For surgery 3/12 as pt was on eliquis prior to admit -currently stable  Intracranial hematoma:  -The patient has been evaluated by neurosurgery -Remains Neurologically intact and oriented -Repeat CT head recently ordered and reviewed, no changes  Persistent atrial fibrillation: -Continue on home amiodarone -Continue patient on tele. Stable at present  Chronic Systolic Congestive Heart Failure/Ischemic cardiomyopathy:  -The patient's most recent echocardiogram was in 2018 and revealed an EF of 50%. There was no evaluation for diastolic function. There was a severely dilated left atrial appendage.  -Currently not on ACE I due his elevated creatinine in the past.  -Currently stable  CKD III:  -The patient presented with GFR of 60 -Cr higher today, clniically appears dehydrated -Repeat bmet in AM  Dehydration -dry mucus membranes -limited PO intake -Start IVF -repeat bmet in AM  DVT prophylaxis: heparin gtt Code Status: Full Family Communication: Pt in room, family not at bedside Disposition Plan: Uncertain at this time  Consultants:   Orthopedic surgery  Neurosurgery  Procedures:     Antimicrobials: Anti-infectives (From admission, onward)   Start     Dose/Rate Route Frequency Ordered Stop   10/07/18 2000  ceFAZolin (ANCEF) IVPB 2g/100 mL premix     2 g 200 mL/hr over 30 Minutes Intravenous Every 6 hours 10/07/18 1737 10/08/18 0135   10/07/18 1245  ceFAZolin (ANCEF) IVPB 2g/100 mL premix     2 g 200 mL/hr over 30 Minutes Intravenous On call to O.R. 10/06/18 1442 10/07/18 1444   10/06/18  0600  ceFAZolin (ANCEF) IVPB 2g/100 mL premix  Status:  Discontinued     2 g 200 mL/hr over 30 Minutes Intravenous On call to O.R. 10/05/18 2149 10/07/18 0559      Subjective: Denies pain at this time  Objective: Vitals:   10/08/18 1813 10/08/18 2248 10/09/18 0556 10/09/18 1430  BP: 106/71 113/65 96/64 (!) 111/52  Pulse: 75 70 68 75  Resp: 17 16 14 16    Temp: 98 F (36.7 C) 97.8 F (36.6 C) 97.8 F (36.6 C) 98.6 F (37 C)  TempSrc: Oral Oral Oral Oral  SpO2: 96% 99% 96% 95%  Weight:      Height:        Intake/Output Summary (Last 24 hours) at 10/09/2018 1518 Last data filed at 10/09/2018 1350 Gross per 24 hour  Intake 1227.85 ml  Output 1400 ml  Net -172.15 ml   Filed Weights   10/05/18 1913  Weight: 74.1 kg    Examination: General exam: Awake, laying in bed, in nad, dry membranes Respiratory system: Normal respiratory effort, no wheezing Cardiovascular system: regular rate, s1, s2 Gastrointestinal system: Soft, nondistended, positive BS Central nervous system: CN2-12 grossly intact, strength intact Extremities: Perfused, no clubbing Skin: Normal skin turgor, no notable skin lesions seen Psychiatry: Mood normal // no visual hallucinations   Data Reviewed: I have personally reviewed following labs and imaging studies  CBC: Recent Labs  Lab 10/03/18 1815 10/05/18 1350 10/07/18 2340 10/09/18 0307  WBC 5.4 8.2 11.0* 13.7*  NEUTROABS 3.9 7.1  --   --   HGB 10.8* 10.5* 9.4* 9.2*  HCT 33.8* 33.0* 31.6* 30.1*  MCV 98.5 101.5* 104.6* 104.5*  PLT 168 143* 142* 627   Basic Metabolic Panel: Recent Labs  Lab 10/03/18 1815 10/05/18 1350 10/07/18 2340 10/09/18 0307  NA 140 139 134* 135  K 3.7 3.9 4.2 4.4  CL 111 110 105 104  CO2 23 24 21* 22  GLUCOSE 120* 114* 167* 114*  BUN 23 22 24* 29*  CREATININE 1.16 0.99 1.26* 1.35*  CALCIUM 8.0* 8.2* 7.8* 8.3*  MG  --   --  1.7  --    GFR: Estimated Creatinine Clearance: 45.7 mL/min (A) (by C-G formula based on SCr of 1.35 mg/dL (H)). Liver Function Tests: Recent Labs  Lab 10/03/18 1815 10/05/18 1350  AST 20 20  ALT 18 25  ALKPHOS 64 66  BILITOT 0.5 0.5  PROT 5.5* 5.7*  ALBUMIN 2.9* 3.0*   No results for input(s): LIPASE, AMYLASE in the last 168 hours. No results for input(s): AMMONIA in the last 168 hours. Coagulation Profile: No results for input(s): INR,  PROTIME in the last 168 hours. Cardiac Enzymes: No results for input(s): CKTOTAL, CKMB, CKMBINDEX, TROPONINI in the last 168 hours. BNP (last 3 results) No results for input(s): PROBNP in the last 8760 hours. HbA1C: No results for input(s): HGBA1C in the last 72 hours. CBG: No results for input(s): GLUCAP in the last 168 hours. Lipid Profile: No results for input(s): CHOL, HDL, LDLCALC, TRIG, CHOLHDL, LDLDIRECT in the last 72 hours. Thyroid Function Tests: No results for input(s): TSH, T4TOTAL, FREET4, T3FREE, THYROIDAB in the last 72 hours. Anemia Panel: No results for input(s): VITAMINB12, FOLATE, FERRITIN, TIBC, IRON, RETICCTPCT in the last 72 hours. Sepsis Labs: No results for input(s): PROCALCITON, LATICACIDVEN in the last 168 hours.  Recent Results (from the past 240 hour(s))  MRSA PCR Screening     Status: None   Collection Time: 10/05/18 10:44 PM  Result Value Ref Range Status   MRSA by PCR NEGATIVE NEGATIVE Final    Comment:        The GeneXpert MRSA Assay (FDA approved for NASAL specimens only), is one component of a comprehensive MRSA colonization surveillance program. It is not intended to diagnose MRSA infection nor to guide or monitor treatment for MRSA infections. Performed at [facility], [place] [address], [place], [place] [address]      Radiology Studies: Pelvis Portable  Result Date: 10/07/2018 CLINICAL DATA:  Post right total hip arthroplasty. EXAM: PORTABLE PELVIS 1-2 VIEWS COMPARISON:  CT of the pelvis 10/05/2018 FINDINGS: Post total right hip arthroplasty with normal alignment of the orthopedic hardware. Expected soft tissue edema and emphysema. Skin staples seen. Stable appearance of the left hip with mild osteoarthritic changes. Stable sclerotic changes involving the intertrochanteric region of the left hip. Radioactive seed within the prostate gland. IMPRESSION: Post total right hip arthroplasty without evidence of immediate  complications. Electronically Signed   By: Fidela Salisbury M.D.   On: 10/07/2018 16:29   Dg C-arm 1-60 Min-no Report  Result Date: 10/07/2018 Fluoroscopy was utilized by the requesting physician.  No radiographic interpretation.   Dg Hip Operative Unilat With Pelvis Right  Result Date: 10/07/2018 CLINICAL DATA:  83 year old male undergoing right hip ORIF following fracture. EXAM: OPERATIVE RIGHT HIP (WITH PELVIS IF PERFORMED) 2 VIEWS TECHNIQUE: Fluoroscopic spot image(s) were submitted for interpretation post-operatively. COMPARISON:  10/05/2018 right hip series. FINDINGS: 2 intraoperative fluoroscopic AP spot views of the lower pelvis and right hip demonstrate bipolar type hip arthroplasty in place. Normal alignment in the AP view. Prostate brachytherapy redemonstrated. IMPRESSION: Intraoperative view of bipolar right hip arthroplasty with no adverse features. Electronically Signed   By: Genevie Ann M.D.   On: 10/07/2018 16:48    Scheduled Meds: . apixaban  2.5 mg Oral BID  . [START ON 10/11/2018] apixaban  5 mg Oral BID  . bacitracin  1 application Topical BID  . docusate sodium  100 mg Oral BID  . feeding supplement (ENSURE ENLIVE)  237 mL Oral BID BM  . pantoprazole  40 mg Oral Daily  . tamsulosin  0.4 mg Oral QPC supper   Continuous Infusions: . lactated ringers 75 mL/hr at 10/09/18 1350     LOS: 4 days   Marylu Lund, MD [facility] Pager On Amion  If 7PM-7AM, please contact night-coverage 10/09/2018, 3:18 PM   \

## 2018-10-09 NOTE — Progress Notes (Signed)
Carl [name]  MRN: [id number] DOB/Age: [birth year] 83 y.o. [facility] Procedure: Procedure(s) (LRB): TOTAL HIP ARTHROPLASTY ANTERIOR APPROACH (Right)     Subjective: Currently working with therapy. No specific complaints  Vital Signs Temp:  [97.8 F (36.6 C)-98 F (36.7 C)] 97.8 F (36.6 C) (03/14 0556) Pulse Rate:  [68-75] 68 (03/14 0556) Resp:  [14-17] 14 (03/14 0556) BP: (96-113)/(59-71) 96/64 (03/14 0556) SpO2:  [96 %-99 %] 96 % (03/14 0556)  Lab Results Recent Labs    10/07/18 2340 10/09/18 0307  WBC 11.0* 13.7*  HGB 9.4* 9.2*  HCT 31.6* 30.1*  PLT 142* 181   BMET Recent Labs    10/07/18 2340 10/09/18 0307  NA 134* 135  K 4.2 4.4  CL 105 104  CO2 21* 22  GLUCOSE 167* 114*  BUN 24* 29*  CREATININE 1.26* 1.35*  CALCIUM 7.8* 8.3*   No results found for: INR   Exam Moving right lower extremity well.         Plan Cont therapy and disposition planning per plan   Jenetta Loges PA-C  10/09/2018, 11:25 AM Contact # [phone]

## 2018-10-09 NOTE — Progress Notes (Signed)
Physical Therapy Treatment Patient Details Name: Carl [name] MRN: [id number] DOB: 02-Aug-1935 Today's Date: 10/09/2018    History of Present Illness Pt s/p falls with R hip fx s/p anterior direct THR.  Pt with hx of CAD, A-fib, and Prostate CA    PT Comments    Pt performed therex program with assistance and increased time.  OOB deferred at pt request to pm session, pt states just back to bed with nursing.   Follow Up Recommendations  Follow surgeon's recommendation for DC plan and follow-up therapies     Equipment Recommendations  None recommended by PT    Recommendations for Other Services       Precautions / Restrictions Precautions Precautions: Fall Restrictions Weight Bearing Restrictions: No Other Position/Activity Restrictions: WBAT    Mobility  Bed Mobility               General bed mobility comments: OOB activity deferred, pt just back to bed with RN   Transfers                    Ambulation/Gait                 Stairs             Wheelchair Mobility    Modified Rankin (Stroke Patients Only)       Balance                                            Cognition Arousal/Alertness: Awake/alert Behavior During Therapy: WFL for tasks assessed/performed Overall Cognitive Status: Within Functional Limits for tasks assessed                                        Exercises Total Joint Exercises Ankle Circles/Pumps: AROM;Both;15 reps;Supine Quad Sets: AROM;Both;Supine;10 reps Heel Slides: AAROM;Right;20 reps;Supine Hip ABduction/ADduction: AAROM;Right;15 reps;Supine    General Comments        Pertinent Vitals/Pain Pain Assessment: 0-10 Pain Score: 9  Pain Location: R hip Pain Descriptors / Indicators: Aching;Sore Pain Intervention(s): Limited activity within patient's tolerance;Monitored during session;Premedicated before session;Patient requesting pain meds-RN notified;Ice  applied    Home Living                      Prior Function            PT Goals (current goals can now be found in the care plan section) Acute Rehab PT Goals Patient Stated Goal: Regain IND to return home PT Goal Formulation: With patient Time For Goal Achievement: 10/15/18 Potential to Achieve Goals: Good Progress towards PT goals: Progressing toward goals    Frequency    7X/week      PT Plan Current plan remains appropriate    Co-evaluation              AM-PAC PT "6 Clicks" Mobility   Outcome Measure  Help needed turning from your back to your side while in a flat bed without using bedrails?: A Lot Help needed moving from lying on your back to sitting on the side of a flat bed without using bedrails?: A Lot Help needed moving to and from a bed to a chair (including a wheelchair)?: A Little Help needed standing up from a chair  using your arms (e.g., wheelchair or bedside chair)?: A Little Help needed to walk in hospital room?: A Little Help needed climbing 3-5 steps with a railing? : A Lot 6 Click Score: 15    End of Session   Activity Tolerance: Patient tolerated treatment well Patient left: in bed;with call bell/phone within reach Nurse Communication: Mobility status PT Visit Diagnosis: Difficulty in walking, not elsewhere classified (R26.2);Unsteadiness on feet (R26.81)     Time: 4097-3532 PT Time Calculation (min) (ACUTE ONLY): 19 min  Charges:  $Therapeutic Exercise: 8-22 mins                     [place] Pager [phone] Office [phone]    Mamie Diiorio 10/09/2018, 1:26 PM

## 2018-10-10 LAB — BASIC METABOLIC PANEL
Anion gap: 7 (ref 5–15)
BUN: 23 mg/dL (ref 8–23)
CO2: 24 mmol/L (ref 22–32)
Calcium: 7.9 mg/dL — ABNORMAL LOW (ref 8.9–10.3)
Chloride: 104 mmol/L (ref 98–111)
Creatinine, Ser: 1.05 mg/dL (ref 0.61–1.24)
GFR calc Af Amer: >60 mL/min (ref >60)
GFR calc non Af Amer: >60 mL/min (ref >60)
Glucose, Bld: 116 mg/dL — ABNORMAL HIGH (ref 70–99)
Potassium: 4.3 mmol/L (ref 3.5–5.1)
Sodium: 135 mmol/L (ref 135–145)

## 2018-10-10 LAB — CBC
HCT: 24.3 % — ABNORMAL LOW (ref 39.0–52.0)
Hemoglobin: 7.5 g/dL — ABNORMAL LOW (ref 13.0–17.0)
MCH: 31.6 pg (ref 26.0–34.0)
MCHC: 30.9 g/dL (ref 30.0–36.0)
MCV: 102.5 fL — ABNORMAL HIGH (ref 80.0–100.0)
Platelets: 133 10*3/uL — ABNORMAL LOW (ref 150–400)
RBC: 2.37 MIL/uL — ABNORMAL LOW (ref 4.22–5.81)
RDW: 13.8 % (ref 11.5–15.5)
WBC: 11.8 10*3/uL — ABNORMAL HIGH (ref 4.0–10.5)
nRBC: 0 % (ref 0.0–0.2)

## 2018-10-10 LAB — HEMOGLOBIN AND HEMATOCRIT, BLOOD
HCT: 25.7 % — ABNORMAL LOW (ref 39.0–52.0)
Hemoglobin: 7.7 g/dL — ABNORMAL LOW (ref 13.0–17.0)

## 2018-10-10 NOTE — Progress Notes (Signed)
Physical Therapy Treatment Patient Details Name: Carl [name] MRN: [id number] DOB: February 06, 1936 Today's Date: 10/10/2018    History of Present Illness Pt s/p falls with R hip fx s/p anterior direct THR.  Pt with hx of CAD, A-fib, and Prostate CA    PT Comments    Pt continues cooperative and is progressing slowly with mobility but is also impulsive and with frequent cueing required for safety awareness.   Follow Up Recommendations  Follow surgeon's recommendation for DC plan and follow-up therapies;Home health PT     Equipment Recommendations  None recommended by PT    Recommendations for Other Services       Precautions / Restrictions Precautions Precautions: Fall Restrictions Weight Bearing Restrictions: No Other Position/Activity Restrictions: WBAT    Mobility  Bed Mobility Overal bed mobility: Needs Assistance Bed Mobility: Sit to Supine       Sit to supine: Min assist   General bed mobility comments: increased time with cues for sequence and use of L LE to self assist.  Transfers Overall transfer level: Needs assistance Equipment used: Rolling walker (2 wheeled) Transfers: Sit to/from Stand Sit to Stand: Min assist;Min guard Stand pivot transfers: Min assist       General transfer comment: cues for safety, proper transition position and use of UEs to self assist.  Stand/pvt with RW recliner to [facility]  Ambulation/Gait Ambulation/Gait assistance: Min assist Gait Distance (Feet): 180 Feet Assistive device: Rolling walker (2 wheeled) Gait Pattern/deviations: Decreased step length - right;Step-to pattern;Step-through pattern;Decreased step length - left;Shuffle;Trunk flexed Gait velocity: cues to slow pace for safety   General Gait Details: Constant cues for posture, position from RW, safety awareness and to slow pace   Stairs             Wheelchair Mobility    Modified Rankin (Stroke Patients Only)       Balance Overall balance assessment:  Needs assistance Sitting-balance support: No upper extremity supported;Feet supported Sitting balance-Leahy Scale: Good     Standing balance support: Bilateral upper extremity supported Standing balance-Leahy Scale: Fair                              Cognition Arousal/Alertness: Awake/alert Behavior During Therapy: WFL for tasks assessed/performed;Impulsive Overall Cognitive Status: Within Functional Limits for tasks assessed                                        Exercises      General Comments        Pertinent Vitals/Pain Pain Assessment: 0-10 Pain Score: 8  Pain Location: R hip Pain Descriptors / Indicators: Aching;Sore Pain Intervention(s): Limited activity within patient's tolerance;Monitored during session;Premedicated before session;Patient requesting pain meds-RN notified    Home Living                      Prior Function            PT Goals (current goals can now be found in the care plan section) Acute Rehab PT Goals Patient Stated Goal: Regain IND to return home PT Goal Formulation: With patient Time For Goal Achievement: 10/15/18 Potential to Achieve Goals: Good Progress towards PT goals: Progressing toward goals    Frequency    7X/week      PT Plan Discharge plan needs to be updated  Co-evaluation              AM-PAC PT "6 Clicks" Mobility   Outcome Measure  Help needed turning from your back to your side while in a flat bed without using bedrails?: A Little Help needed moving from lying on your back to sitting on the side of a flat bed without using bedrails?: A Little Help needed moving to and from a bed to a chair (including a wheelchair)?: A Little Help needed standing up from a chair using your arms (e.g., wheelchair or bedside chair)?: A Little Help needed to walk in hospital room?: A Little Help needed climbing 3-5 steps with a railing? : A Lot 6 Click Score: 17    End of Session  Equipment Utilized During Treatment: Gait belt Activity Tolerance: Patient tolerated treatment well Patient left: in bed;with call bell/phone within reach Nurse Communication: Mobility status PT Visit Diagnosis: Difficulty in walking, not elsewhere classified (R26.2);Unsteadiness on feet (R26.81)     Time: 6770-3403 PT Time Calculation (min) (ACUTE ONLY): 26 min  Charges:  $Gait Training: 8-22 mins $Therapeutic Activity: 8-22 mins                     [place] Pager [phone] Office [phone]    Mehdi Gironda 10/10/2018, 12:22 PM

## 2018-10-10 NOTE — Progress Notes (Signed)
PROGRESS NOTE    KOSTA SCHNITZLER  NAT:[id number] DOB: Dec 26, 1935 DOA: 10/05/2018 PCP: [name], MD    Brief Narrative:  83 yr old man who carries a past medical history significant for AAA, arthritis, bladder calculus, Epidural hemorrhage, chronic anemia, anxiety, CKD Stage III, CAD s/p CABG in 2006, DOE, GERD, Hematuria, Recurrent UTI's, Lung nodule that is followed by his PCP, Diastolic CHF, Hyperlipidemia, Hypertension, BPH, paroxysmal atrial fibrillation for which the patient is on eliquis, renal calculi, and recurrent prostate cancer.  The patient states that he has felt weaker than usual lately. He fell once last night and again this morning. He was able to get up on his own last night, but not this morning. He was brought to the ED by EMS. He was found to have a mildly impacted acute fracture of the mid right femoral neck.   Vital signs in the ED were: Temp: 98.3. Heart rate: 72, blood pressure was 131/66. He was saturating 97% on room air.  Sodium was 139. Potassium was 3.9. Choride was 110. BUN was 22. Creatinine was 0.99. WBC was 8.2 . Hemoglobin 10.5. Platelets was 143.   CT of his head demonstrates a small area of either epidural hematoma or loculated subdural hematoma without mass effect or signs of acute bleeding. The patient has been on Eliquis at home for his atrial fibrillation.  The hospitalists were consulted to admit the patient for further evaluation and plan. Neurosurgery and orthopedic surgery were consulted from the ED.   Assessment & Plan:   Active Problems:   Hx of CABG x 5 '06   Essential hypertension   Cardiomyopathy, ischemic   Chronic kidney disease (CKD), stage III (moderate) (HCC)   Persistent atrial fibrillation   Acute congestive heart failure ([place])   Fall   Physical deconditioning   Hip fracture (HCC)   Intracranial hematoma (HCC)   Displaced fracture of right femoral neck (HCC)   Left hip fracture:  -Orthopedic surgery  consulted. Underwent surgery 3/12  -currently stable  Intracranial hematoma:  -The patient has been evaluated by neurosurgery -Repeat CT head recently ordered and reviewed, no changes -Remains neurologically intact  Persistent atrial fibrillation: -Continue on home amiodarone -Continue patient on tele. Currently stable  Chronic Systolic Congestive Heart Failure/Ischemic cardiomyopathy:  -The patient's most recent echocardiogram was in 2018 and revealed an EF of 50%. There was no evaluation for diastolic function. There was a severely dilated left atrial appendage.  -Currently not on ACE I due his elevated creatinine in the past.  -Presently stable  CKD III:  -The patient presented with GFR of 60 -Cr improved with IVF hydration -Recheck bmet in AM  Dehydration -dry mucus membranes -limited PO intake -Improved with IVF -recheck bmet in AM  DVT prophylaxis: heparin gtt Code Status: Full Family Communication: Pt in room, family not at bedside Disposition Plan: Uncertain at this time  Consultants:   Orthopedic surgery  Neurosurgery  Procedures:     Antimicrobials: Anti-infectives (From admission, onward)   Start     Dose/Rate Route Frequency Ordered Stop   10/07/18 2000  ceFAZolin (ANCEF) IVPB 2g/100 mL premix     2 g 200 mL/hr over 30 Minutes Intravenous Every 6 hours 10/07/18 1737 10/08/18 0135   10/07/18 1245  ceFAZolin (ANCEF) IVPB 2g/100 mL premix     2 g 200 mL/hr over 30 Minutes Intravenous On call to O.R. 10/06/18 1442 10/07/18 1444   10/06/18 0600  ceFAZolin (ANCEF) IVPB 2g/100 mL premix  Status:  Discontinued     2 g 200 mL/hr over 30 Minutes Intravenous On call to O.R. 10/05/18 2149 10/07/18 0559      Subjective: Feels uncomfortable sitting in chair  Objective: Vitals:   10/09/18 1430 10/09/18 2239 10/10/18 0516 10/10/18 1342  BP: (!) 111/52 127/62 112/60 122/69  Pulse: 75 88 84 81  Resp: 16 16 14 17   Temp: 98.6 F (37 C) 98.6 F (37 C)  98.3 F (36.8 C) 98.6 F (37 C)  TempSrc: Oral Oral Oral Oral  SpO2: 95% 96% 98% 97%  Weight:      Height:        Intake/Output Summary (Last 24 hours) at 10/10/2018 1745 Last data filed at 10/10/2018 1715 Gross per 24 hour  Intake 2890.94 ml  Output 550 ml  Net 2340.94 ml   Filed Weights   10/05/18 1913  Weight: 74.1 kg    Examination: General exam: Conversant, in no acute distress, sitting in chair Respiratory system: normal chest rise, clear, no audible wheezing Cardiovascular system: irregularly irregular, s1-s2 Gastrointestinal system: Nondistended, nontender, pos BS Central nervous system: No seizures, no tremors Extremities: No cyanosis, no joint deformities Skin: No rashes, no pallor Psychiatry: Affect normal // no auditory hallucinations   Data Reviewed: I have personally reviewed following labs and imaging studies  CBC: Recent Labs  Lab 10/03/18 1815 10/05/18 1350 10/07/18 2340 10/09/18 0307 10/10/18 0437 10/10/18 1328  WBC 5.4 8.2 11.0* 13.7* 11.8*  --   NEUTROABS 3.9 7.1  --   --   --   --   HGB 10.8* 10.5* 9.4* 9.2* 7.5* 7.7*  HCT 33.8* 33.0* 31.6* 30.1* 24.3* 25.7*  MCV 98.5 101.5* 104.6* 104.5* 102.5*  --   PLT 168 143* 142* 181 133*  --    Basic Metabolic Panel: Recent Labs  Lab 10/03/18 1815 10/05/18 1350 10/07/18 2340 10/09/18 0307 10/10/18 0437  NA 140 139 134* 135 135  K 3.7 3.9 4.2 4.4 4.3  CL 111 110 105 104 104  CO2 23 24 21* 22 24  GLUCOSE 120* 114* 167* 114* 116*  BUN 23 22 24* 29* 23  CREATININE 1.16 0.99 1.26* 1.35* 1.05  CALCIUM 8.0* 8.2* 7.8* 8.3* 7.9*  MG  --   --  1.7  --   --    GFR: Estimated Creatinine Clearance: 58.8 mL/min (by C-G formula based on SCr of 1.05 mg/dL). Liver Function Tests: Recent Labs  Lab 10/03/18 1815 10/05/18 1350  AST 20 20  ALT 18 25  ALKPHOS 64 66  BILITOT 0.5 0.5  PROT 5.5* 5.7*  ALBUMIN 2.9* 3.0*   No results for input(s): LIPASE, AMYLASE in the last 168 hours. No results for  input(s): AMMONIA in the last 168 hours. Coagulation Profile: No results for input(s): INR, PROTIME in the last 168 hours. Cardiac Enzymes: No results for input(s): CKTOTAL, CKMB, CKMBINDEX, TROPONINI in the last 168 hours. BNP (last 3 results) No results for input(s): PROBNP in the last 8760 hours. HbA1C: No results for input(s): HGBA1C in the last 72 hours. CBG: No results for input(s): GLUCAP in the last 168 hours. Lipid Profile: No results for input(s): CHOL, HDL, LDLCALC, TRIG, CHOLHDL, LDLDIRECT in the last 72 hours. Thyroid Function Tests: No results for input(s): TSH, T4TOTAL, FREET4, T3FREE, THYROIDAB in the last 72 hours. Anemia Panel: No results for input(s): VITAMINB12, FOLATE, FERRITIN, TIBC, IRON, RETICCTPCT in the last 72 hours. Sepsis Labs: No results for input(s): PROCALCITON, LATICACIDVEN in the last 168 hours.  Recent  Results (from the past 240 hour(s))  MRSA PCR Screening     Status: None   Collection Time: 10/05/18 10:44 PM  Result Value Ref Range Status   MRSA by PCR NEGATIVE NEGATIVE Final    Comment:        The GeneXpert MRSA Assay (FDA approved for NASAL specimens only), is one component of a comprehensive MRSA colonization surveillance program. It is not intended to diagnose MRSA infection nor to guide or monitor treatment for MRSA infections. Performed at [facility], [place] [address] [place], [place] [address]      Radiology Studies: No results found.  Scheduled Meds: . apixaban  2.5 mg Oral BID  . [START ON 10/11/2018] apixaban  5 mg Oral BID  . bacitracin  1 application Topical BID  . docusate sodium  100 mg Oral BID  . feeding supplement (ENSURE ENLIVE)  237 mL Oral BID BM  . pantoprazole  40 mg Oral Daily  . tamsulosin  0.4 mg Oral QPC supper   Continuous Infusions: . lactated ringers 75 mL/hr at 10/10/18 1715     LOS: 5 days   Marylu Lund, MD [facility] Pager On Amion  If 7PM-7AM, please contact  night-coverage 10/10/2018, 5:45 PM   \

## 2018-10-10 NOTE — Progress Notes (Signed)
Patient is noted with skin opening in bilateral buttocks, pink and red in color with scant serous drainage. Denies pain. Cleansed the wound and new foam dressing is applied. On Call MD is paged. Patient need wound consult. Patient is non compliant with turning and positioning schedule, prefers supine position despite teaching. Will monitor closely.

## 2018-10-10 NOTE — Progress Notes (Signed)
Subjective: 3 Days Post-Op Procedure(s) (LRB): TOTAL HIP ARTHROPLASTY ANTERIOR APPROACH (Right) Patient reports pain as 3 on 0-10 scale. Doing well. Dressing is dry. HBg 7.5.  Will repeat at 4:00 today and in A.M.His vitals are stable.  Objective: Vital signs in last 24 hours: Temp:  [98.3 F (36.8 C)-98.6 F (37 C)] 98.3 F (36.8 C) (03/15 0516) Pulse Rate:  [75-88] 84 (03/15 0516) Resp:  [14-16] 14 (03/15 0516) BP: (111-127)/(52-62) 112/60 (03/15 0516) SpO2:  [95 %-98 %] 98 % (03/15 0516)  Intake/Output from previous day: 03/14 0701 - 03/15 0700 In: 3306.4 [P.O.:1840; I.V.:1466.4] Out: 1450 [Urine:1450] Intake/Output this shift: No intake/output data recorded.  Recent Labs    10/07/18 2340 10/09/18 0307 10/10/18 0437  HGB 9.4* 9.2* 7.5*   Recent Labs    10/09/18 0307 10/10/18 0437  WBC 13.7* 11.8*  RBC 2.88* 2.37*  HCT 30.1* 24.3*  PLT 181 133*   Recent Labs    10/09/18 0307 10/10/18 0437  NA 135 135  K 4.4 4.3  CL 104 104  CO2 22 24  BUN 29* 23  CREATININE 1.35* 1.05  GLUCOSE 114* 116*  CALCIUM 8.3* 7.9*   No results for input(s): LABPT, INR in the last 72 hours.  Dorsiflexion/Plantar flexion intact   Assessment/Plan: 3 Days Post-Op Procedure(s) (LRB): TOTAL HIP ARTHROPLASTY ANTERIOR APPROACH (Right) Up with therapy.Will follow HBg closely.   HBg 7.5. Will repeat at 4:00 and in A.M.   Latanya Maudlin 10/10/2018, 8:29 AM

## 2018-10-11 ENCOUNTER — Ambulatory Visit: Payer: Medicare Other | Admitting: Cardiovascular Disease

## 2018-10-11 LAB — BASIC METABOLIC PANEL
Anion gap: 5 (ref 5–15)
BUN: 19 mg/dL (ref 8–23)
CO2: 26 mmol/L (ref 22–32)
Calcium: 8 mg/dL — ABNORMAL LOW (ref 8.9–10.3)
Chloride: 104 mmol/L (ref 98–111)
Creatinine, Ser: 1.04 mg/dL (ref 0.61–1.24)
GFR calc Af Amer: >60 mL/min (ref >60)
GFR calc non Af Amer: >60 mL/min (ref >60)
Glucose, Bld: 103 mg/dL — ABNORMAL HIGH (ref 70–99)
Potassium: 4.1 mmol/L (ref 3.5–5.1)
Sodium: 135 mmol/L (ref 135–145)

## 2018-10-11 LAB — HEMOGLOBIN AND HEMATOCRIT, BLOOD
HCT: 24.8 % — ABNORMAL LOW (ref 39.0–52.0)
Hemoglobin: 7.5 g/dL — ABNORMAL LOW (ref 13.0–17.0)

## 2018-10-11 MED ORDER — HYDROCODONE-ACETAMINOPHEN 5-325 MG PO TABS: 1.0000 | ORAL_TABLET | ORAL | 0 refills | Status: DC | PRN

## 2018-10-11 NOTE — TOC Transition Note (Addendum)
Transition of Care [facility]) - CM/SW Discharge Note   Patient Details  Name: Carl [name] MRN: [id number] Date of Birth: 04-10-1936  [facility] CM/SW Contact:  Lia Hopping, [place] Phone Number: 10/11/2018, 11:31 AM   Clinical Narrative:    Insurance Authorization received at [facility] Patient will discharge to [facility].  Nurse call report to: [phone]   Final next level of care: Skilled Nursing Facility Barriers to Discharge: Continued Medical Work up, Ship broker   Patient Goals and CMS Choice Patient states their goals for this hospitalization and ongoing recovery are:: " to regain strength " CMS Medicare.gov Compare Post Acute Care list provided to:: Other (Comment Required)(Son-Maxi Nash Mantis. )    Discharge Placement PASRR number recieved: 10/08/18 Existing PASRR number confirmed : 10/08/18          Patient chooses bed at: [place], [place] Patient to be transferred to facility by: [place]  Name of family member notified: Son Olney  Patient and family notified of of transfer: 10/11/18  Discharge Plan andServices Discharge Planning Services: CM Consult                      Social Determinants of Health (SDOH) Interventions     Readmission Risk Interventions No flowsheet data found.

## 2018-10-11 NOTE — Progress Notes (Signed)
Report called to Cristie Hem, RN at [facility].  Patient stable and awaiting transport.

## 2018-10-11 NOTE — Progress Notes (Signed)
    Subjective:  Patient reports pain as mild to moderate.  Denies N/V/CP/SOB. No c/o.  Objective:   VITALS:   Vitals:   10/10/18 0516 10/10/18 1342 10/10/18 2050 10/11/18 0424  BP: 112/60 122/69 (!) 122/57 (!) 111/56  Pulse: 84 81 82 79  Resp: 14 17 16 15   Temp: 98.3 F (36.8 C) 98.6 F (37 C) 99.4 F (37.4 C) 98.3 F (36.8 C)  TempSrc: Oral Oral Oral Oral  SpO2: 98% 97% 97% 94%  Weight:      Height:        NAD ABD soft Sensation intact distally Intact pulses distally Dorsiflexion/Plantar flexion intact Incision: dressing C/D/I Compartment soft   Lab Results  Component Value Date   WBC 11.8 (H) 10/10/2018   HGB 7.5 (L) 10/11/2018   HCT 24.8 (L) 10/11/2018   MCV 102.5 (H) 10/10/2018   PLT 133 (L) 10/10/2018   BMET    Component Value Date/Time   NA 135 10/11/2018 0320   NA 139 07/12/2018 1148   K 4.1 10/11/2018 0320   CL 104 10/11/2018 0320   CO2 26 10/11/2018 0320   GLUCOSE 103 (H) 10/11/2018 0320   BUN 19 10/11/2018 0320   BUN 24 07/12/2018 1148   CREATININE 1.04 10/11/2018 0320   CREATININE 1.68 (H) 06/18/2018 1611   CALCIUM 8.0 (L) 10/11/2018 0320   GFRNONAA >60 10/11/2018 0320   GFRAA >60 10/11/2018 0320     Assessment/Plan: 4 Days Post-Op   Active Problems:   Hx of CABG x 5 '06   Essential hypertension   Cardiomyopathy, ischemic   Chronic kidney disease (CKD), stage III (moderate) (HCC)   Persistent atrial fibrillation   Acute congestive heart failure (HCC)   Fall   Physical deconditioning   Hip fracture (HCC)   Intracranial hematoma (HCC)   Displaced fracture of right femoral neck (HCC)   WBAT with walker DVT ppx: chemical ppx on hold due to SDH, SCDs, TEDS PO pain control PT/OT Dispo: D/C planning  [place] Neidy Guerrieri 10/11/2018, 12:47 PM   Rod Can, MD Cell: [phone] [place] [facility] now [facility]  [facility] [address], [address], [place], [place] [address] Phone:  [phone] [email or web address] Facebook  [employer]

## 2018-10-11 NOTE — Care Management Important Message (Signed)
Important Message  Patient Details  Name: Carl [name] MRN: [id number] Date of Birth: Jan 14, 1936   Medicare Important Message Given:  Yes    Kerin Salen 10/11/2018, 1:53 PMImportant Message  Patient Details  Name: Carl [name] MRN: [id number] Date of Birth: 1936-05-21   Medicare Important Message Given:  Yes    Kerin Salen 10/11/2018, 1:53 PM

## 2018-10-11 NOTE — Discharge Summary (Signed)
Physician Discharge Summary  Carl [name] BCW:[id number] DOB: [birth year] DOA: 10/05/2018  PCP: [name], MD  Admit date: 10/05/2018 Discharge date: 10/11/2018  Admitted From: Home Disposition:  SNF  Recommendations for Outpatient Follow-up:  1. Follow up with PCP in 1-2 weeks 2. Follow up with Orthopedic Surgery as scheduled 3. Follow up with Neurosurgery in 2 weeks. Neurosurgery recommends repeat CT at that time to decide whether to resume eliquis. Until then, eliquis on hold  Discharge Condition:Stable CODE STATUS:Full Diet recommendation: Heart healthy   Brief/Interim Summary: 83 yr old man who carries a past medical history significant for AAA, arthritis, bladder calculus, Epidural hemorrhage, chronic anemia, anxiety, CKD Stage III, CAD s/p CABG in 2006, DOE, GERD, Hematuria, Recurrent UTI's, Lung nodule that is followed by his PCP, Diastolic CHF, Hyperlipidemia, Hypertension, BPH, paroxysmal atrial fibrillation for which the patient is on eliquis, renal calculi, and recurrent prostate cancer.  The patient states that he has felt weaker than usual lately. He fell once last night and again this morning. He was able to get up on his own last night, but not this morning. He was brought to the ED by EMS. He was found to have a mildly impacted acute fracture of the mid right femoral neck.   Vital signs in the ED were: Temp: 98.3. Heart rate: 72, blood pressure was 131/66. He was saturating 97% on room air.  Sodium was 139. Potassium was 3.9. Choride was 110. BUN was 22. Creatinine was 0.99. WBC was 8.2 . Hemoglobin 10.5. Platelets was 143.   CT of his head demonstrates a small area of either epidural hematoma or loculated subdural hematoma without mass effect or signs of acute bleeding. The patient has been on Eliquis at home for his atrial fibrillation.  The hospitalists were consulted to admit the patient for further evaluation and plan. Neurosurgery and orthopedic  surgery were consulted from the ED.   Discharge Diagnoses:  Active Problems:   Hx of CABG x 5 '06   Essential hypertension   Cardiomyopathy, ischemic   Chronic kidney disease (CKD), stage III (moderate) (HCC)   Persistent atrial fibrillation   Acute congestive heart failure ([place])   Fall   Physical deconditioning   Hip fracture (HCC)   Intracranial hematoma (HCC)   Displaced fracture of right femoral neck (HCC)   Left hip fracture:  -Orthopedic surgery consulted. Underwent surgery 3/12  -currently stable  Intracranial hematoma:  -The patient has been evaluated by neurosurgery -Repeat CT head recently ordered and reviewed, no changes -Remains neurologically intact -Discussed with Neurosurgery who recommends holding further eliquis with Neurosurgery follow up in 2 weeks and CT head at that time. Resumption of eliquis per Neurosurgery recommendations then.  Persistent atrial fibrillation: -Continue on home amiodarone -Continue patient on tele. Currently stable  Chronic Systolic Congestive Heart Failure/Ischemic cardiomyopathy:  -The patient's most recent echocardiogram was in 2018 and revealed an EF of 50%. There was no evaluation for diastolic function. There was a severely dilated left atrial appendage.  -Initially held ACE I due his elevated creatinine in the past.  -Presently stable  CKD III:  -The patient presented with GFR of 60 -Cr improved with IVF hydration  Dehydration -dry mucus membranes -limited PO intake -Improved with IVF   Discharge Instructions   Allergies as of 10/11/2018      Reactions   Ciprofloxacin Other (See Comments)   PER HEART DR      Medication List    STOP taking these medications  apixaban 5 MG Tabs tablet Commonly known as:  Eliquis     TAKE these medications   amiodarone 200 MG tablet Commonly known as:  PACERONE Take 1 tablet (200 mg total) by mouth daily.   HYDROcodone-acetaminophen 5-325 MG tablet Commonly  known as:  NORCO/VICODIN Take 1 tablet by mouth every 4 (four) hours as needed for moderate pain (pain score 4-6). What changed:  reasons to take this   hydrocortisone cream 1 % Apply topically 4 (four) times daily.   Muscle Rub 10-15 % Crea Apply 1 application topically as needed for muscle pain (lower back pain).   omeprazole 40 MG capsule Commonly known as:  PRILOSEC Take 1 capsule (40 mg total) by mouth daily.   opium-belladonna 16.2-60 MG suppository Commonly known as:  B&O SUPPRETTES Place 1 suppository rectally every 6 (six) hours as needed for bladder spasms.   polyethylene glycol packet Commonly known as:  MIRALAX / GLYCOLAX Take 17 g by mouth daily.   rosuvastatin 40 MG tablet Commonly known as:  CRESTOR Take 1 tablet (40 mg total) by mouth daily.   senna-docusate 8.6-50 MG tablet Commonly known as:  Senokot-S Take 1 tablet by mouth 2 (two) times daily. What changed:    when to take this  reasons to take this   tamsulosin 0.4 MG Caps capsule Commonly known as:  FLOMAX Take 1 capsule (0.4 mg total) by mouth daily after supper.   TYLENOL 500 MG tablet Generic drug:  acetaminophen Take 1,000 mg by mouth every 6 (six) hours as needed for mild pain (for pain).      Follow-up Information    Swinteck, Aaron Edelman, MD. Schedule an appointment as soon as possible for a visit in 2 weeks.   Specialty:  Orthopedic Surgery Why:  For suture removal Contact information: [address] [place] [address] [phone]        [name], MD. Schedule an appointment as soon as possible for a visit in 1 week(s).   Specialty:  Family Medicine Contact information: [address] [place] [address] [phone]          Allergies  Allergen Reactions  . Ciprofloxacin Other (See Comments)    PER HEART DR    Consultations:  Orthopedic Surgery  Neurosurgery  Procedures/Studies: Dg Chest 2 View  Result Date:  10/05/2018 CLINICAL DATA:  Recent fall with right hip pain, initial encounter EXAM: CHEST - 2 VIEW COMPARISON:  01/04/2018 FINDINGS: Right-sided PICC line is noted at the cavoatrial junction. Postsurgical changes are again seen. Cardiac shadow is not significantly enlarged. The lungs are well aerated bilaterally. Sclerotic focus is noted within the inferior aspect of the scapula on the left stable from previous exam. No focal infiltrate or sizable effusion is seen. IMPRESSION: No acute abnormality noted. Electronically Signed   By: Inez Catalina M.D.   On: 10/05/2018 10:49   Ct Head Wo Contrast  Result Date: 10/06/2018 CLINICAL DATA:  Headache EXAM: CT HEAD WITHOUT CONTRAST TECHNIQUE: Contiguous axial images were obtained from the base of the skull through the vertex without intravenous contrast. COMPARISON:  10/05/2018 head CT FINDINGS: Brain: There is an unchanged extra-axial mass or collection of the left frontal convexity, measuring 25 x 9 mm. No abnormality of the underlying brain. No acute intracranial hemorrhage. There is periventricular hypoattenuation compatible with chronic microvascular disease. Vascular: No abnormal hyperdensity of the major intracranial arteries or dural venous sinuses. No intracranial atherosclerosis. Skull: The visualized skull base, calvarium and extracranial  soft tissues are normal. Sinuses/Orbits: Partial opacification of the left frontal and ethmoid sinuses. The orbits are normal. IMPRESSION: 1. Unchanged appearance of extra-axial lesion along the left frontal convexity, favored to be a chronic, encapsulated subdural or epidural hematoma. Meningioma is an alternative consideration. MRI with and without contrast or CT with contrast should be able to differentiate. 2. Chronic small vessel ischemia.  No acute abnormality. Electronically Signed   By: Ulyses Jarred M.D.   On: 10/06/2018 15:55   Ct Head Wo Contrast  Result Date: 10/05/2018 CLINICAL DATA:  Several recent falls  EXAM: CT HEAD WITHOUT CONTRAST TECHNIQUE: Contiguous axial images were obtained from the base of the skull through the vertex without intravenous contrast. COMPARISON:  None. FINDINGS: Brain: There is mild diffuse atrophy. There is somewhat greater frontal atrophy, particular on the right, than elsewhere. There is an extra-axial fluid collection in the mid to posterior left frontal region which is of similar attenuation of the brain parenchyma measuring 1.0 cm. This area is localized and has an appearance suggesting epidural fluid. It causes slight localized mass effect on the left frontal lobe, but no midline shift. No other extra-axial fluid is identified beyond fluid felt to be associated with atrophy in the frontal lobes. There is no intra-axial mass or hemorrhage. There is patchy small vessel disease in the centra semiovale bilaterally. No acute appearing infarct is evident. Vascular: No hyperdense vessel. There is calcification in each carotid siphon region. Skull: The bony calvarium appears intact. Sinuses/Orbits: There is opacification in portions of the left anterior ethmoid and inferior left frontal sinus regions. Other visualized paranasal sinuses are clear. Orbits appear symmetric bilaterally. Other: Visualized mastoid air cells are clear. IMPRESSION: Probable subacute focal epidural hematoma left frontal region with maximum thickness of 1.0 cm. It causes minimal mass effect upon the adjacent left frontal lobe but no midline shift. No other extra-axial fluid identified. Note that there is moderate frontal atrophy bilaterally, slightly more severe on the right than on the left. There is patchy periventricular small vessel disease. No acute infarct evident. No intra-axial hemorrhage or mass. There are foci of arterial vascular calcification. There is ethmoid and frontal sinus disease on the left side. Critical Value/emergent results were called by telephone at the time of interpretation on 10/05/2018 at  11:18 am to Dr. Theotis Burrow , who verbally acknowledged these results. Electronically Signed   By: Lowella Grip III M.D.   On: 10/05/2018 11:19   Pelvis Portable  Result Date: 10/07/2018 CLINICAL DATA:  Post right total hip arthroplasty. EXAM: PORTABLE PELVIS 1-2 VIEWS COMPARISON:  CT of the pelvis 10/05/2018 FINDINGS: Post total right hip arthroplasty with normal alignment of the orthopedic hardware. Expected soft tissue edema and emphysema. Skin staples seen. Stable appearance of the left hip with mild osteoarthritic changes. Stable sclerotic changes involving the intertrochanteric region of the left hip. Radioactive seed within the prostate gland. IMPRESSION: Post total right hip arthroplasty without evidence of immediate complications. Electronically Signed   By: Fidela Salisbury M.D.   On: 10/07/2018 16:29   Ct Hip Right Wo Contrast  Result Date: 10/05/2018 CLINICAL DATA:  Right hip pain. Patient fell last night and again today. EXAM: CT OF THE RIGHT HIP WITHOUT CONTRAST TECHNIQUE: Multidetector CT imaging of the right hip was performed according to the standard protocol. Multiplanar CT image reconstructions were also generated. COMPARISON:  Radiographs 10/05/2018.  Pelvic CT 08/16/2018. FINDINGS: Bones/Joint/Cartilage The bones are demineralized. There is an acute fracture of the mid right femoral  neck which is mildly impacted laterally. There is no significant displacement. This fracture demonstrates no intertrochanteric extension or involvement of the femoral head articular surface. There are stable underlying mild right hip degenerative changes. No evidence of acute fracture of the right hemipelvis. There is no significant hip joint effusion. Sclerotic lesion in the right superior acetabulum is stable, consistent with a bone island. Ligaments Suboptimally assessed by CT. Muscles and Tendons The visualized pelvic and right hip muscles appear unremarkable. Soft tissues Minimal subcutaneous  edema lateral to the greater trochanter. No significant soft tissue hematoma. Prostate brachytherapy seeds are noted. Small right-sided bladder calculi are noted without evidence of distal ureteral dilatation. IMPRESSION: 1. Mildly impacted acute fracture of the mid right femoral neck. This demonstrates no pathologic features. 2. No significant associated hematoma or joint effusion. 3. Mild underlying right hip degenerative changes. 4. Persistent right-sided bladder calculi. Electronically Signed   By: Richardean Sale M.D.   On: 10/05/2018 13:27   Dg C-arm 1-60 Min-no Report  Result Date: 10/07/2018 Fluoroscopy was utilized by the requesting physician.  No radiographic interpretation.   Dg Hip Operative Unilat With Pelvis Right  Result Date: 10/07/2018 CLINICAL DATA:  83 year old male undergoing right hip ORIF following fracture. EXAM: OPERATIVE RIGHT HIP (WITH PELVIS IF PERFORMED) 2 VIEWS TECHNIQUE: Fluoroscopic spot image(s) were submitted for interpretation post-operatively. COMPARISON:  10/05/2018 right hip series. FINDINGS: 2 intraoperative fluoroscopic AP spot views of the lower pelvis and right hip demonstrate bipolar type hip arthroplasty in place. Normal alignment in the AP view. Prostate brachytherapy redemonstrated. IMPRESSION: Intraoperative view of bipolar right hip arthroplasty with no adverse features. Electronically Signed   By: Genevie Ann M.D.   On: 10/07/2018 16:48   Dg Hip Unilat  With Pelvis 2-3 Views Right  Result Date: 10/05/2018 CLINICAL DATA:  Recent fall with right-sided hip pain, initial encounter EXAM: DG HIP (WITH OR WITHOUT PELVIS) 2-3V RIGHT COMPARISON:  None. FINDINGS: Pelvic ring is intact. There is a lucency identified in the right femoral neck with some impaction identified consistent with mildly displaced fracture. CT would be confirmatory. Prostate brachytherapy seeds are noted. Chronic calcification is noted within the proximal left femur. Degenerative changes of lumbar  spine are noted. IMPRESSION: Lucency in the right femoral neck with suggestion of mild impaction. CT would be helpful for confirmation. Electronically Signed   By: Inez Catalina M.D.   On: 10/05/2018 10:52   Ir Picc Placement Right >5 Yrs Inc Img Guide  Result Date: 09/22/2018 INDICATION: Patient with history of cystitis; central venous access requested for antibiotic therapy. EXAM: ULTRASOUND AND FLUOROSCOPIC GUIDED PICC LINE INSERTION MEDICATIONS: 1% lidocaine to skin and subcutaneous tissue ANESTHESIA/SEDATION: None FLUOROSCOPY TIME:  Fluoroscopy Time:  48 seconds (8 mGy). COMPLICATIONS: None immediate. TECHNIQUE: The procedure, risks, benefits, and alternatives were explained to the patient and informed written consent was obtained. A timeout was performed prior to the initiation of the procedure. The right upper extremity was prepped with chlorhexidine in a sterile fashion, and a sterile drape was applied covering the operative field. Maximum barrier sterile technique with sterile gowns and gloves were used for the procedure. A timeout was performed prior to the initiation of the procedure. Local anesthesia was provided with 1% lidocaine. Under direct ultrasound guidance, the right basilic vein was accessed with a micropuncture kit after the overlying soft tissues were anesthetized with 1% lidocaine. An ultrasound image was saved for documentation purposes. A guidewire was advanced to the level of the superior caval-atrial junction for measurement  purposes and the PICC line was cut to length. A peel-away sheath was placed and a 48 cm, 5 [place], single lumen was inserted to level of the superior caval-atrial junction. A post procedure spot fluoroscopic was obtained. The catheter easily aspirated and flushed and was sutured in place. PICC was capped. A dressing was placed. The patient tolerated the procedure well without immediate post procedural complication. FINDINGS: After catheter placement, the tip lies  within the superior caval atrial junction the catheter aspirates and flushes normally and is ready for immediate use. IMPRESSION: Successful ultrasound and fluoroscopic guided placement of a right basilic vein approach, 48 cm, 5 French, single lumen PICC with tip at the superior caval-atrial junction. The PICC line is ready for immediate use. Read by: Rowe Robert, PA-C Electronically Signed   By: Aletta Edouard M.D.   On: 09/22/2018 11:56     Subjective: Without complaints  Discharge Exam: Vitals:   10/10/18 2050 10/11/18 0424  BP: (!) 122/57 (!) 111/56  Pulse: 82 79  Resp: 16 15  Temp: 99.4 F (37.4 C) 98.3 F (36.8 C)  SpO2: 97% 94%   Vitals:   10/10/18 0516 10/10/18 1342 10/10/18 2050 10/11/18 0424  BP: 112/60 122/69 (!) 122/57 (!) 111/56  Pulse: 84 81 82 79  Resp: '14 17 16 15  ' Temp: 98.3 F (36.8 C) 98.6 F (37 C) 99.4 F (37.4 C) 98.3 F (36.8 C)  TempSrc: Oral Oral Oral Oral  SpO2: 98% 97% 97% 94%  Weight:      Height:        General: Pt is alert, awake, not in acute distress Cardiovascular: RRR, S1/S2 +, no rubs, no gallops Respiratory: CTA bilaterally, no wheezing, no rhonchi Abdominal: Soft, NT, ND, bowel sounds + Extremities: no edema, no cyanosis   The results of significant diagnostics from this hospitalization (including imaging, microbiology, ancillary and laboratory) are listed below for reference.     Microbiology: Recent Results (from the past 240 hour(s))  MRSA PCR Screening     Status: None   Collection Time: 10/05/18 10:44 PM  Result Value Ref Range Status   MRSA by PCR NEGATIVE NEGATIVE Final    Comment:        The GeneXpert MRSA Assay (FDA approved for NASAL specimens only), is one component of a comprehensive MRSA colonization surveillance program. It is not intended to diagnose MRSA infection nor to guide or monitor treatment for MRSA infections. Performed at [facility], [place] [address], [place], [place]  [address]      Labs: BNP (last 3 results) Recent Labs    10/05/18 1350  BNP 694.8*   Basic Metabolic Panel: Recent Labs  Lab 10/05/18 1350 10/07/18 2340 10/09/18 0307 10/10/18 0437 10/11/18 0320  NA 139 134* 135 135 135  K 3.9 4.2 4.4 4.3 4.1  CL 110 105 104 104 104  CO2 24 21* '22 24 26  ' GLUCOSE 114* 167* 114* 116* 103*  BUN 22 24* 29* 23 19  CREATININE 0.99 1.26* 1.35* 1.05 1.04  CALCIUM 8.2* 7.8* 8.3* 7.9* 8.0*  MG  --  1.7  --   --   --    Liver Function Tests: Recent Labs  Lab 10/05/18 1350  AST 20  ALT 25  ALKPHOS 66  BILITOT 0.5  PROT 5.7*  ALBUMIN 3.0*   No results for input(s): LIPASE, AMYLASE in the last 168 hours. No results for input(s): AMMONIA in the last 168 hours. CBC: Recent Labs  Lab 10/05/18  1350 10/07/18 2340 10/09/18 0307 10/10/18 0437 10/10/18 1328 10/11/18 0454  WBC 8.2 11.0* 13.7* 11.8*  --   --   NEUTROABS 7.1  --   --   --   --   --   HGB 10.5* 9.4* 9.2* 7.5* 7.7* 7.5*  HCT 33.0* 31.6* 30.1* 24.3* 25.7* 24.8*  MCV 101.5* 104.6* 104.5* 102.5*  --   --   PLT 143* 142* 181 133*  --   --    Cardiac Enzymes: No results for input(s): CKTOTAL, CKMB, CKMBINDEX, TROPONINI in the last 168 hours. BNP: Invalid input(s): POCBNP CBG: No results for input(s): GLUCAP in the last 168 hours. D-Dimer No results for input(s): DDIMER in the last 72 hours. Hgb A1c No results for input(s): HGBA1C in the last 72 hours. Lipid Profile No results for input(s): CHOL, HDL, LDLCALC, TRIG, CHOLHDL, LDLDIRECT in the last 72 hours. Thyroid function studies No results for input(s): TSH, T4TOTAL, T3FREE, THYROIDAB in the last 72 hours.  Invalid input(s): FREET3 Anemia work up No results for input(s): VITAMINB12, FOLATE, FERRITIN, TIBC, IRON, RETICCTPCT in the last 72 hours. Urinalysis    Component Value Date/Time   COLORURINE RED (A) 08/04/2018 2002   APPEARANCEUR TURBID (A) 08/04/2018 2002   LABSPEC 1.015 08/04/2018 2002   PHURINE 6.5 08/04/2018  2002   GLUCOSEU 100 (A) 08/04/2018 2002   HGBUR LARGE (A) 08/04/2018 2002   BILIRUBINUR LARGE (A) 08/04/2018 2002   BILIRUBINUR small 01/11/2018 1225   KETONESUR 15 (A) 08/04/2018 2002   PROTEINUR >300 (A) 08/04/2018 2002   UROBILINOGEN 0.2 01/11/2018 1225   NITRITE POSITIVE (A) 08/04/2018 2002   LEUKOCYTESUR LARGE (A) 08/04/2018 2002   Sepsis Labs Invalid input(s): PROCALCITONIN,  WBC,  LACTICIDVEN Microbiology Recent Results (from the past 240 hour(s))  MRSA PCR Screening     Status: None   Collection Time: 10/05/18 10:44 PM  Result Value Ref Range Status   MRSA by PCR NEGATIVE NEGATIVE Final    Comment:        The GeneXpert MRSA Assay (FDA approved for NASAL specimens only), is one component of a comprehensive MRSA colonization surveillance program. It is not intended to diagnose MRSA infection nor to guide or monitor treatment for MRSA infections. Performed at [facility], [place] [address] [place], [place] [address]    Time spent: 30 min  SIGNED:   Marylu Lund, MD  [facility] 10/11/2018, 1:55 PM  If 7PM-7AM, please contact night-coverage

## 2018-10-11 NOTE — Consult Note (Addendum)
[place] Nurse wound consult note Reason for Consult: Consult requested for buttocks.  Pt states he slides on and off his chair at home and also when he goes to the bathroom. Locations and appearance are consistent with chronic partial thickness wounds related to shear, not pressure.  Wound type: Left and right buttocks each with 2X2X.1cm red moist partial thickness wounds with "shaggy edges." Pressure Injury POA: These are not pressure injuries and were present on admission Dressing procedure/placement/frequency: Foam dressing to protect from further injury.  Discussed plan of care with patient. Please re-consult if further assistance is needed.  Thank-you,  Julien Girt MSN, [place], [place], [place], [place]

## 2018-10-11 NOTE — Progress Notes (Signed)
PICC line removed per order. Site clean, dry, and intact with vaseline gauze/gauze dressing. Patient and nurse aware patient on bedrest until 1550. Patient aware to keep dressing on and dry for 24 hours.

## 2018-10-11 NOTE — Plan of Care (Signed)
  Problem: Education: Goal: Verbalization of understanding the information provided (i.e., activity precautions, restrictions, etc) will improve Outcome: Progressing Goal: Individualized Educational Video(s) Outcome: Progressing   Problem: Activity: Goal: Ability to ambulate and perform ADLs will improve Outcome: Progressing   Problem: Clinical Measurements: Goal: Postoperative complications will be avoided or minimized Outcome: Progressing   Problem: Self-Concept: Goal: Ability to maintain and perform role responsibilities to the fullest extent possible will improve Outcome: Progressing   Problem: Pain Management: Goal: Pain level will decrease Outcome: Progressing   Problem: Education: Goal: Knowledge of General Education information will improve Description Including pain rating scale, medication(s)/side effects and non-pharmacologic comfort measures Outcome: Progressing   Problem: Health Behavior/Discharge Planning: Goal: Ability to manage health-related needs will improve Outcome: Progressing   Problem: Clinical Measurements: Goal: Ability to maintain clinical measurements within normal limits will improve Outcome: Progressing Goal: Will remain free from infection Outcome: Progressing Goal: Diagnostic test results will improve Outcome: Progressing Goal: Respiratory complications will improve Outcome: Progressing Goal: Cardiovascular complication will be avoided Outcome: Progressing   Problem: Activity: Goal: Risk for activity intolerance will decrease Outcome: Progressing   Problem: Nutrition: Goal: Adequate nutrition will be maintained Outcome: Progressing   Problem: Coping: Goal: Level of anxiety will decrease Outcome: Progressing   Problem: Elimination: Goal: Will not experience complications related to bowel motility Outcome: Progressing Goal: Will not experience complications related to urinary retention Outcome: Progressing   Problem: Pain  Managment: Goal: General experience of comfort will improve Outcome: Progressing   Problem: Education: Goal: Knowledge of the prescribed therapeutic regimen will improve Outcome: Progressing Goal: Understanding of discharge needs will improve Outcome: Progressing Goal: Individualized Educational Video(s) Outcome: Progressing   Problem: Activity: Goal: Ability to avoid complications of mobility impairment will improve Outcome: Progressing Goal: Ability to tolerate increased activity will improve Outcome: Progressing   Problem: Clinical Measurements: Goal: Postoperative complications will be avoided or minimized Outcome: Progressing   Problem: Pain Management: Goal: Pain level will decrease with appropriate interventions Outcome: Progressing   Problem: Skin Integrity: Goal: Will show signs of wound healing Outcome: Progressing

## 2018-10-13 ENCOUNTER — Other Ambulatory Visit: Payer: Medicare Other

## 2018-10-18 ENCOUNTER — Other Ambulatory Visit: Payer: Self-pay | Admitting: Internal Medicine

## 2018-10-18 DIAGNOSIS — S06369D Traumatic hemorrhage of cerebrum, unspecified, with loss of consciousness of unspecified duration, subsequent encounter: Secondary | ICD-10-CM

## 2018-10-22 ENCOUNTER — Other Ambulatory Visit: Payer: Medicare Other

## 2018-10-26 ENCOUNTER — Other Ambulatory Visit: Payer: Self-pay

## 2018-10-26 ENCOUNTER — Telehealth: Payer: Self-pay | Admitting: Family Medicine

## 2018-10-26 ENCOUNTER — Ambulatory Visit
Admission: RE | Admit: 2018-10-26 | Discharge: 2018-10-26 | Disposition: A | Discharge status: Home / Self Care | Payer: Medicare Other | Source: Ambulatory Visit | Attending: Internal Medicine | Admitting: Internal Medicine

## 2018-10-26 ENCOUNTER — Other Ambulatory Visit: Payer: Medicare Other

## 2018-10-26 DIAGNOSIS — S06369D Traumatic hemorrhage of cerebrum, unspecified, with loss of consciousness of unspecified duration, subsequent encounter: Secondary | ICD-10-CM

## 2018-10-26 NOTE — Telephone Encounter (Signed)
Noted. We can plan to follow-up with him if he is discharged.

## 2018-10-26 NOTE — Telephone Encounter (Signed)
Sent to PCP as an FYI  

## 2018-10-26 NOTE — Telephone Encounter (Signed)
FYI-Pt is in [place].

## 2018-10-27 ENCOUNTER — Ambulatory Visit: Payer: Medicare Other | Admitting: Family Medicine

## 2018-10-29 ENCOUNTER — Ambulatory Visit: Payer: Medicare Other | Admitting: Family Medicine

## 2018-11-02 ENCOUNTER — Telehealth: Payer: Self-pay | Admitting: Family Medicine

## 2018-11-02 NOTE — Telephone Encounter (Signed)
Called and spoke w/ Inez Catalina from [facility] who requested for skilled nursing 1 x 3 weeks, PT evaluation, and OT evaluation.  Gave verbal orders as requested.

## 2018-11-02 NOTE — Telephone Encounter (Signed)
Copied from [place] [phone]. Topic: Quick Communication - Home Health Verbal Orders >> Nov 02, 2018 11:46 AM Berneta Levins wrote: Caller/Agency: Inez Catalina from [facility] Number: [phone], OK to leave a message Pt was admitted to home health yesterday and they need to see if Dr. [name] will be willing to sign orders.

## 2018-11-09 ENCOUNTER — Ambulatory Visit: Payer: Medicare Other | Admitting: Cardiovascular Disease

## 2018-11-09 ENCOUNTER — Telehealth: Payer: Self-pay

## 2018-11-09 NOTE — Telephone Encounter (Signed)
Copied from [place] [phone]. Topic: General - Other >> Nov 09, 2018 11:31 AM Richardo Priest, NT wrote: Reason for CRM: Patient's family member called to get an appointment for the patient in regards to their medication and would like a call back at [phone]

## 2018-11-12 ENCOUNTER — Telehealth: Payer: Self-pay | Admitting: Family Medicine

## 2018-11-12 NOTE — Telephone Encounter (Signed)
Copied from [place] [phone]. Topic: Quick Communication - Rx Refill/Question >> Nov 12, 2018  2:37 PM Sheran Luz wrote: Medication: apixaban (ELIQUIS) 5 MG TABS tablet   Patient is requesting a refill of this medication.   Preferred Pharmacy (with phone number or street name):[id number] [place], [place] - [phone] (Phone) [phone] (Fax)

## 2018-11-12 NOTE — Telephone Encounter (Signed)
Last OV   Medication not on pt's medication list   Looks like in the past we have given him samples of eliquis  Was advise on TE 09/07/2018 OK by urologist to stop asprin and go back on eliquis   Sent to PCP for approval

## 2018-11-12 NOTE — Telephone Encounter (Signed)
This medication was discontinued during his hospitalization last month. He was advised to not take this medication and he would need to be evaluated at follow-up by neurosurgery to determine when or if he could resume this medication. Please see if the patient saw neurosurgery and if he did we will need to request the notes. Thanks.

## 2018-11-15 ENCOUNTER — Telehealth: Payer: Self-pay

## 2018-11-15 DIAGNOSIS — I4891 Unspecified atrial fibrillation: Secondary | ICD-10-CM

## 2018-11-15 NOTE — Telephone Encounter (Signed)
Called and spoke with daughter. She stated that the last appt was April 3 and the phone number to call was  [phone] Dr. Bevelyn Buckles.   I called and asked for the fax number but they stated that Bevelyn Buckles was internal Medicaine and NOT a neurosurgeon.   I called pt's daughter back she does not know who the neurosurgeon was.   Fax number [phone]

## 2018-11-15 NOTE — Telephone Encounter (Signed)
Called and spoke with pt's daughter she wanted to know is it OK to give him an 81 mg of asprin in the mean time while trying to figure out what he needs to take blood thinner wise?

## 2018-11-15 NOTE — Telephone Encounter (Signed)
Noted.  It looks like Dr. Sharlett Iles did order a CT scan to follow-up on his prior CT findings.  Please see if we can request records from them to determine if they made any decision on whether or not to resume his Eliquis.  It may be worthwhile referring the patient to a neurosurgeon to get their input as well.  Thanks.

## 2018-11-15 NOTE — Telephone Encounter (Signed)
Called and spoke with pt's daughter on Friday 11/12/2018. Daughter advised and stated that she will call me back with the neurosurgeon name and when his last appt was.   Will call the daughter today.

## 2018-11-15 NOTE — Telephone Encounter (Signed)
Copied from [place] [phone]. Topic: General - Other >> Nov 15, 2018  2:53 PM Yvette Rack wrote: Reason for CRM: Pt daughter [name] called requesting to speak with Eunice. [name] requests call back at [phone]

## 2018-11-16 NOTE — Telephone Encounter (Signed)
Called and spoke with pt's daughter. Daughter advised and voiced understanding.

## 2018-11-16 NOTE — Telephone Encounter (Signed)
I think it is reasonable for him to take aspirin 81 mg daily at this time. Please request records from Dr Sharlett Iles to determine what course of action they decided to take after his repeat CT head on 10/26/18. Thanks.

## 2018-11-16 NOTE — Telephone Encounter (Signed)
Will fax tomorrow once I am back at [place] at [facility] today.

## 2018-11-17 NOTE — Telephone Encounter (Signed)
Confirmed fax went through.

## 2018-11-17 NOTE — Telephone Encounter (Signed)
I have sent a fax to dr. Sharlett Iles fax number [phone] requesting records and if pt should continue eliquis.

## 2018-11-19 NOTE — Telephone Encounter (Signed)
Waiting to hear back from doctor Bevelyn Buckles

## 2018-11-19 NOTE — Telephone Encounter (Signed)
Called and left a detailed VM to call back.

## 2018-11-19 NOTE — Telephone Encounter (Signed)
Sent to PCP as an FYI  

## 2018-11-19 NOTE — Telephone Encounter (Signed)
Pts daughter states she would like a call from [facility] regarding putting her father back on a blood thinner. Please advise.

## 2018-11-19 NOTE — Telephone Encounter (Signed)
Patient's daughter returning call to [place]. Please advise.

## 2018-11-19 NOTE — Telephone Encounter (Signed)
He can take aspirin 81 mg daily. This was in the previous note. The records will need to be reviewed to determine if it is appropriate for him to go back on the eliquis. Was he restarted on this after seeing Dr Sharlett Iles?

## 2018-11-19 NOTE — Telephone Encounter (Signed)
Called and spoke with pt's daughter. Told daughter that we faxed Dr. Sharlett Iles but I have not heard back from them. Pt does not have a neurosurgen and the CT came back normal and fine per daughter. Daughter stated that she has all the OV notes and will come here Monday to drop the paper work off but would like to know if he needs to be on a blood thinner or not on low dose Asprin as of now.    Sent to PCP

## 2018-11-24 NOTE — Telephone Encounter (Signed)
Covering nurse from [employer] office called back and told me to reach out to [facility]. I reached out to [facility] Phone: [phone] and they are planning on faxing up the pt's discharge summary for provider or covering provider to review and decide.

## 2018-11-24 NOTE — Telephone Encounter (Signed)
I have not recived a fax back from Dr. Sharlett Iles. I called their office and Dr. Sharlett Iles is out of the office so the receptionist sent it to the covering provider's nurse. Left that doctor's nurse a detailed VM regarding situation an patient's name and DOB ask for a call back.

## 2018-11-24 NOTE — Telephone Encounter (Signed)
Called pt's daughter advised her of the situation and still waiting to hear back on the status of continue eliquis or not once we know I will most defiantly give her a call back.

## 2018-11-25 NOTE — Telephone Encounter (Signed)
I looked up the number to dr. Rockey Situ and the call is not going through. Do you have a good contact or fax number for me to reach out to Dr. Rockey Situ?  Thanks [employer]

## 2018-11-25 NOTE — Telephone Encounter (Signed)
[facility] has sent over the OV notes could you review these Lauren to see if pt is OK to continue blood thinner or not while PCP is out or just wait till PCP return pt is on low dose 81 Mg Asprin as of now.

## 2018-11-25 NOTE — Telephone Encounter (Signed)
No nothing other than [facility] [place].

## 2018-11-25 NOTE — Telephone Encounter (Signed)
The CT scan appears stable, done on 10/26/2018  His nursing home records show a new order for eliquis 5 mg one time daily that was ordered on 10/27/2018 & started on 10/28/2018; written by Dr Sharlett Iles.  I have a couple of questions --- When discharged from the nursing home, usually prescriptions are given to set patient up for home; was this done? Was he given an eliquis Rx?  Previously he had been on eliquis 5 mg, 2 times per day per Dr Rockey Situ at cardiology before his hip replacement--- has cardiology been asked about what the dosing should be?   I just want to make sure we get the dosing correct.   Thanks,  LG

## 2018-11-26 NOTE — Addendum Note (Signed)
Addended by: Philis Nettle on: 11/26/2018 10:05 AM   Modules accepted: Orders

## 2018-11-26 NOTE — Telephone Encounter (Signed)
If we could reach out to heart care [place] to confirm eliquis dose that would be helpful.  It appears he was restarted on 5 mg once daily on 10/28/2018 by Dr Philip Aspen prior to nursing home discharge and after his follow up head CT on 10/26/2018 showed to be stable.  He was taking eliquis 5 mg BID before his hip surgery/nursing home stay.

## 2018-11-26 NOTE — Telephone Encounter (Signed)
I spoke with Dr.Sonnenberg -- He would like to recheck CBC before we re-start the eliquis  Would he be able to do lab today? Or Monday?  If CBC is improved from hospital, then we will re-start eliquis.  Also please ask daughter if she was given prescriptions on discharge from the nursing home.   I will put in CBC order

## 2018-11-26 NOTE — Telephone Encounter (Signed)
Called and spoke with pt's daughter. Daughter advised and we scheduled an appt for him to come on Monday for lab work.

## 2018-11-29 ENCOUNTER — Encounter: Payer: Self-pay | Admitting: Family Medicine

## 2018-11-29 ENCOUNTER — Other Ambulatory Visit: Payer: Self-pay

## 2018-11-29 ENCOUNTER — Ambulatory Visit (INDEPENDENT_AMBULATORY_CARE_PROVIDER_SITE_OTHER): Payer: Medicare Other | Admitting: Family Medicine

## 2018-11-29 ENCOUNTER — Telehealth: Payer: Self-pay | Admitting: Family Medicine

## 2018-11-29 DIAGNOSIS — D649 Anemia, unspecified: Secondary | ICD-10-CM | POA: Diagnosis not present

## 2018-11-29 DIAGNOSIS — S06360D Traumatic hemorrhage of cerebrum, unspecified, without loss of consciousness, subsequent encounter: Secondary | ICD-10-CM

## 2018-11-29 DIAGNOSIS — S72002D Fracture of unspecified part of neck of left femur, subsequent encounter for closed fracture with routine healing: Secondary | ICD-10-CM | POA: Diagnosis not present

## 2018-11-29 NOTE — Progress Notes (Signed)
Virtual Visit via video Note  This visit type was conducted due to national recommendations for restrictions regarding the COVID-19 pandemic (e.g. social distancing).  This format is felt to be most appropriate for this patient at this time.  All issues noted in this document were discussed and addressed.  No physical exam was performed (except for noted visual exam findings with Video Visits).   I connected with Carl [name] on 12/04/18 at  9:00 AM EDT by a video enabled telemedicine application and verified that I am speaking with the correct person using two identifiers. Location patient: home Location provider: work Persons participating in the virtual visit: patient, provider, daughter  I discussed the limitations, risks, security and privacy concerns of performing an evaluation and management service by telephone and the availability of in person appointments. I also discussed with the patient that there may be a patient responsible charge related to this service. The patient expressed understanding and agreed to proceed.   Reason for visit: Follow-up.  Femoral neck fracture, possible CNS bleed, anemia  HPI: Patient was hospitalized after a fall in which he was found to have a mildly impacted acute fracture of the right mid femoral neck.  He was admitted to the hospital and underwent total hip arthroplasty.  He was stable and was discharged to rehab.  He did well there.  He has been discharged home.  He was also found to have a possible intracranial hematoma.  He underwent repeat CT head that had no changes.  Neurosurgery apparently recommended follow-up CT head in 2 weeks which the patient underwent through rehab which revealed an unchanged small partially calcified lentiform extra-axial lesion about the left frontal lobe.  They favored a small meningioma versus a small chronic hemorrhage.  Radiology did note he could have a contrast-enhanced MRI that could be used to definitively  characterize.  His Eliquis was held until this follow-up scan.  On review of records it appears that this was restarted at 5 mg daily at rehab though the patient has not been taking this since discharge home.  He was found to be anemic after surgery.  It is not evident that this was rechecked at rehab.  Patient has had no additional falls.  He feels significantly better.  Minimal pain though no medications needed.  He notes when he fell previously he had no head injury.  He has had no headaches.  He has had no palpitations, chest pain, or shortness of breath.  He is taking an 81 mg aspirin daily.  ROS: See pertinent positives and negatives per HPI.  Past Medical History:  Diagnosis Date  . AAA (abdominal aortic aneurysm) ([place])    CT abd. dated 06-18-2018 in epic,  3.1cm;  followed by pcp  . Arthritis    arms  . Bladder calculus   . Chronic anemia   . Chronic anxiety   . CKD (chronic kidney disease), stage III ([place])   . Coronary artery disease CARDIOLOGIST-  DR Rockey Situ   a. CABG 2006 with  LIMA-LAD, SVG-Ramus, SVG-OM, seq SVG-PLV-PDA.  . Diastolic CHF ([place])    followed by cardiology  . DOE (dyspnea on exertion)   . GERD (gastroesophageal reflux disease)   . Hematuria   . History of bladder stone   . History of non-ST elevation myocardial infarction (NSTEMI) 03/30/2005   post op recovery inguinal hernia repair  . History of recurrent UTIs    last admission 08-04-2018 pseudomonas UTI  . Hyperlipidemia   . Hyperplasia of  prostate without lower urinary tract symptoms (LUTS)   . Hypertension   . Ischemic cardiomyopathy    echo (01/13) 45-50% septal akinesis, mild MR/  echo  (01/16) ef 60-65%, mid-apical anteroseptal hypokinesis;   echo 09-16-2016 ef 50-55%  . Left ureteral stone   . Lung nodule    noted 08/ 2018;  followed by pcp  . Moderate mitral regurgitation   . Orthostatic hypotension   . PAC (premature atrial contraction)   . Paroxysmal atrial flutter ([place])    a. dx 08/2016,  started on amiodarone.;  08/ 2018 AFlutter  . Persistent atrial fibrillation followed by cardiologist-- dr Rockey Situ   dx AFib 02/ 2018  s/p  DCCV 04/ 2018;  08/ 2018  AFlutter w/ RVR in setting UTI  . Pulmonary hypertension ([place])    last echo in epic 09-16-2016  . Recurrent prostate cancer ([place]) UROLOGIST-- DR BELL   s/p  radioactive prostate seed implants in 1997;    due to PSA rising pt has been doing lupron injection's few past several yrs at dr bell office  . Renal calculi    bilateral per CT 06-18-2018  . S/P CABG x 5 04/04/2005   LIMA to LAD,  SVG to Ramus,  SVG to OM,  seqSVG to PLV and PDA  . Wears glasses     Past Surgical History:  Procedure Laterality Date  . CARDIAC CATHETERIZATION  04/02/2005   dr Doreatha Lew    Critical three-vessel coronary disease --  Essentially normal left ventricular function , ef 55%  . CARDIOVASCULAR STRESS TEST  05-01-2008  dr Doreatha Lew /  dr harding   Low risk nuclear study w/ no ischemia or scar but flattened septal motion/  ef 64%  . CARDIOVERSION N/A 11/19/2016   Procedure: CARDIOVERSION;  Surgeon: Jerline Pain, MD;  Location: [facility];  Service: Cardiovascular;  Laterality: N/A;  . COLONOSCOPY N/A 10/23/2016   Procedure: COLONOSCOPY;  Surgeon: Milus Banister, MD;  Location: [facility];  Service: Endoscopy;  Laterality: N/A;  . CORONARY ARTERY BYPASS GRAFT  04/04/2005   dr  Ricard Dillon   LIMA - LAD,  SVG - Ramus, SVG - OM,  seqSVG - PLV and PDA  . CYSTOSCOPY WITH LITHOLAPAXY N/A 05/07/2016   Procedure: CYSTOSCOPY WITH LITHOLAPAXY;  Surgeon: Rana Snare, MD;  Location[facility];  Service: Urology;  Laterality: N/A;  . CYSTOSCOPY WITH RETROGRADE PYELOGRAM, URETEROSCOPY AND STENT PLACEMENT  09/03/2018   Procedure: CYSTOSCOPY WITH RETROGRADE PYELOGRAM, URETEROSCOPY LASER LITHOTRIPSY AND STENT PLACEMENT;  Surgeon: Lucas Mallow, MD;  Location: [facility];  Service: Urology;;  . HOLMIUM LASER APPLICATION N/A 17/40/8144   Procedure: HOLMIUM  LASER APPLICATION;  Surgeon: Rana Snare, MD;  Location: [facility];  Service: Urology;  Laterality: N/A;  . INGUINAL HERNIA REPAIR Left 03-30-2005  dr Excell Seltzer   incarcerated   . RADIOACTIVE PROSTATE SEED IMPLANTS  1997  . TOTAL HIP ARTHROPLASTY Right 10/07/2018   Procedure: TOTAL HIP ARTHROPLASTY ANTERIOR APPROACH;  Surgeon: Rod Can, MD;  Location: [facility];  Service: Orthopedics;  Laterality: Right;  . TRANSTHORACIC ECHOCARDIOGRAM  08/24/2014   mid anteroseptal and distal septak hypokinetic,  mild focal basal LVH, ef 60-65%,  grade 1 diastolic dysfunction/  mild MR/  mild LAE/  mild dilated RV with normal RVSP/  trivial TR    Family History  Problem Relation Age of Onset  . Heart disease Mother   . Cancer Mother   . Cancer Father   . Cancer  Brother     SOCIAL HX: Former smoker.   Current Outpatient Medications:  .  acetaminophen (TYLENOL) 500 MG tablet, Take 1,000 mg by mouth every 6 (six) hours as needed for mild pain (for pain). , Disp: , Rfl:  .  amiodarone (PACERONE) 200 MG tablet, Take 1 tablet (200 mg total) by mouth daily., Disp: 90 tablet, Rfl: 3 .  aspirin 81 MG tablet, Take 81 mg by mouth daily., Disp: , Rfl:  .  omeprazole (PRILOSEC) 40 MG capsule, Take 1 capsule (40 mg total) by mouth daily., Disp: 90 capsule, Rfl: 1 .  opium-belladonna (B&O SUPPRETTES) 16.2-60 MG suppository, Place 1 suppository rectally every 6 (six) hours as needed for bladder spasms., Disp: 12 suppository, Rfl: 0 .  rosuvastatin (CRESTOR) 40 MG tablet, Take 1 tablet (40 mg total) by mouth daily., Disp: 90 tablet, Rfl: 3 .  senna-docusate (SENOKOT-S) 8.6-50 MG tablet, Take 1 tablet by mouth 2 (two) times daily. (Patient taking differently: Take 1 tablet by mouth 2 (two) times daily as needed for mild constipation. ), Disp: , Rfl:  .  tamsulosin (FLOMAX) 0.4 MG CAPS capsule, Take 1 capsule (0.4 mg total) by mouth daily after supper., Disp: 30 capsule, Rfl: 0  EXAM:  VITALS per  patient if applicable: None.  GENERAL: alert, oriented, appears well and in no acute distress  HEENT: atraumatic, conjunttiva clear, no obvious abnormalities on inspection of external nose and ears  NECK: normal movements of the head and neck  LUNGS: on inspection no signs of respiratory distress, breathing rate appears normal, no obvious gross SOB, gasping or wheezing  CV: no obvious cyanosis  MS: moves all visible extremities without noticeable abnormality  PSYCH/NEURO: pleasant and cooperative, no obvious depression or anxiety, speech and thought processing grossly intact  ASSESSMENT AND PLAN:  Discussed the following assessment and plan:  Closed fracture of left hip with routine healing, subsequent encounter  Intracranial hematoma without loss of consciousness, unspecified laterality, subsequent encounter  Normocytic anemia  Hip fracture (HCC) Patient underwent hip replacement for this as well as therapy.  He has done well.  No additional falls.  He will monitor.  Intracranial hematoma (HCC) Undetermined if this is truly a hematoma or if it represents a meningioma.  He has been off of his Eliquis relating to this.  We need to determine if his hemoglobin has improved and then also request discharge summary papers to confirm the plan for his Eliquis.  I will also check with his cardiologist to determine if this finding as well as his prior significant hematuria would preclude the patient from restarting Eliquis.  Normocytic anemia Drop in hemoglobin likely related to surgery.  We will plan to recheck this.  CMA to contact patient to get scheduled for follow-up in 3 months.   I discussed the assessment and treatment plan with the patient. The patient was provided an opportunity to ask questions and all were answered. The patient agreed with the plan and demonstrated an understanding of the instructions.   The patient was advised to call back or seek an in-person evaluation if  the symptoms worsen or if the condition fails to improve as anticipated.   [name], MD

## 2018-11-29 NOTE — Telephone Encounter (Signed)
Need to reach out to [facility]

## 2018-11-29 NOTE — Telephone Encounter (Signed)
Pt has been rescheduled for lab appt for 11/30/2018

## 2018-11-29 NOTE — Telephone Encounter (Signed)
Please call [facility] to see if we can get a discharge summary from them regarding this patient. If they do not have a discharge summary please see if we can get all his notes from his stay there. Please also contact the patients daughter and get him set up for lab work to be done tomorrow. Thanks.

## 2018-11-29 NOTE — Telephone Encounter (Signed)
Pt has been scheduled for a lab appt for 11/30/2018 to recheck INR

## 2018-11-30 ENCOUNTER — Other Ambulatory Visit (INDEPENDENT_AMBULATORY_CARE_PROVIDER_SITE_OTHER): Payer: Medicare Other

## 2018-11-30 ENCOUNTER — Telehealth: Payer: Self-pay | Admitting: *Deleted

## 2018-11-30 DIAGNOSIS — I4891 Unspecified atrial fibrillation: Secondary | ICD-10-CM | POA: Diagnosis not present

## 2018-11-30 LAB — CBC
HCT: 36.4 % — ABNORMAL LOW (ref 39.0–52.0)
Hemoglobin: 11.8 g/dL — ABNORMAL LOW (ref 13.0–17.0)
MCHC: 32.3 g/dL (ref 30.0–36.0)
MCV: 95.6 fl (ref 78.0–100.0)
Platelets: 194 10*3/uL (ref 150.0–400.0)
RBC: 3.81 Mil/uL — ABNORMAL LOW (ref 4.22–5.81)
RDW: 15.4 % (ref 11.5–15.5)
WBC: 6.6 10*3/uL (ref 4.0–10.5)

## 2018-11-30 NOTE — Telephone Encounter (Signed)
Pt was scheduled by [facility] for labs. Came in this morning. Lab note ways INR. However only a CBC was found. Please place future order if a INR is needed. I did not see where patient has had one drawn before so I did not feel comfortable ordering it.  Thanks

## 2018-11-30 NOTE — Telephone Encounter (Signed)
Error on my end thank you for double checking!

## 2018-11-30 NOTE — Telephone Encounter (Signed)
Sorry Dr. [name] was it just an CBC or INR needed too?

## 2018-11-30 NOTE — Telephone Encounter (Signed)
He just needed the CBC.

## 2018-12-01 NOTE — Telephone Encounter (Signed)
Carl [name] from [facility] called she stated that she got the fax and when I had called they pulled up the wrong [facility]. They do have his discharge summary and will fax this fax number was given.  Sent to PCP just as an FYI I will keep a look out for this fax.

## 2018-12-01 NOTE — Telephone Encounter (Signed)
Called [facility] and the pt's social worker is not in the office today spoke with another lady who works more with the short term clients there. She stated that Carl [name] is his Education officer, museum but is out today she told me that Carl [name] was never discharged from [facility] and is still considered a residence there.   She told me to send a fax to [place] fax number [phone] to [name] who is another Education officer, museum there that might be of more help.   [facility] phone number is [phone]  Fax has been sent to [name] another Education officer, museum at [employer]

## 2018-12-02 NOTE — Telephone Encounter (Signed)
Have we received this yet?

## 2018-12-03 NOTE — Telephone Encounter (Signed)
Fax came through placed in you RED folder to review

## 2018-12-04 ENCOUNTER — Telehealth: Payer: Self-pay | Admitting: Family Medicine

## 2018-12-04 NOTE — Assessment & Plan Note (Signed)
Undetermined if this is truly a hematoma or if it represents a meningioma.  He has been off of his Eliquis relating to this.  We need to determine if his hemoglobin has improved and then also request discharge summary papers to confirm the plan for his Eliquis.  I will also check with his cardiologist to determine if this finding as well as his prior significant hematuria would preclude the patient from restarting Eliquis.

## 2018-12-04 NOTE — Assessment & Plan Note (Signed)
Drop in hemoglobin likely related to surgery.  We will plan to recheck this.

## 2018-12-04 NOTE — Telephone Encounter (Signed)
Please let the patient's daughter know that his hemoglobin has improved.  I have received the records from the nursing home and it appears he was restarted on Eliquis while there though it was not at a normal dose.  I have sent a message to his cardiologist to get their input given that the patient has had this possible intracranial bleed and previously had fairly significant blood in his urine.  My concern is the increased risk of bleeding and the possibility of a major bleed while on the Eliquis.  Once I hear back from them we will contact the patients daughter to discuss potentially getting him back on the Eliquis.  Please get the patient set up for follow-up in 3 months as well.

## 2018-12-04 NOTE — Telephone Encounter (Signed)
Reviewed.  Message sent to patient's cardiologist to determine appropriateness of Eliquis given recent bleeding issues.

## 2018-12-04 NOTE — Assessment & Plan Note (Signed)
Patient underwent hip replacement for this as well as therapy.  He has done well.  No additional falls.  He will monitor.

## 2018-12-06 ENCOUNTER — Other Ambulatory Visit: Payer: Medicare Other

## 2018-12-06 NOTE — Telephone Encounter (Signed)
Pt has been scheduled for an appt in September.   Pt's daughter advised that we are awaiting to hear back from cardiologist doctor.   Sent to PCP. Will they be calling you or will this be a fax?

## 2018-12-06 NOTE — Telephone Encounter (Signed)
Please let the patients daughter know that I heard back from Dr Rockey Situ (cardiologist) and he noted that he would like to get the patient set up for a visit with his office to discuss this further and help determine if it was safe for him to go back on the eliquis. I will forward to [facility] as well to help arrange an appointment preferably with Dr Rockey Situ or one of the PAs in his office if he is not available.

## 2018-12-07 ENCOUNTER — Telehealth: Payer: Self-pay

## 2018-12-07 NOTE — Telephone Encounter (Signed)
-----   Message from Minna Merritts, MD sent at 12/05/2018  3:42 PM EDT ----- Needs routine follow-up Thx TG

## 2018-12-07 NOTE — Telephone Encounter (Signed)
Called patient.  No answer. LMOV. Awaiting call back.

## 2018-12-14 NOTE — Telephone Encounter (Signed)
It appears that cardiology attempted to contact the patient though he has not called back. Please contact the patients daughter and let her know this. Please provide them with the office number for Dr Rockey Situ and advise that they call them to schedule an appointment. When the call they should let them know that cardiology had tried to call them and they are to set up an appointment. Thanks.

## 2018-12-15 ENCOUNTER — Encounter: Payer: Self-pay | Admitting: Nurse Practitioner

## 2018-12-15 ENCOUNTER — Telehealth: Payer: Self-pay | Admitting: Nurse Practitioner

## 2018-12-15 NOTE — Telephone Encounter (Signed)
Virtual Visit Pre-Appointment Phone Call  "(Name), I am calling you today to discuss your upcoming appointment. We are currently trying to limit exposure to the virus that causes COVID-19 by seeing patients at home rather than in the office."  1. "What is the BEST phone number to call the day of the visit?" - include this in appointment notes  2. Do you have or have access to (through a family member/friend) a smartphone with video capability that we can use for your visit?" a. If yes - list this number in appt notes as cell (if different from BEST phone #) and list the appointment type as a VIDEO visit in appointment notes b. If no - list the appointment type as a PHONE visit in appointment notes  3. Confirm consent - "In the setting of the current Covid19 crisis, you are scheduled for a (phone or video) visit with your provider on (date) at (time).  Just as we do with many in-office visits, in order for you to participate in this visit, we must obtain consent.  If you'd like, I can send this to your mychart (if signed up) or email for you to review.  Otherwise, I can obtain your verbal consent now.  All virtual visits are billed to your insurance company just like a normal visit would be.  By agreeing to a virtual visit, we'd like you to understand that the technology does not allow for your provider to perform an examination, and thus may limit your provider's ability to fully assess your condition. If your provider identifies any concerns that need to be evaluated in person, we will make arrangements to do so.  Finally, though the technology is pretty good, we cannot assure that it will always work on either your or our end, and in the setting of a video visit, we may have to convert it to a phone-only visit.  In either situation, we cannot ensure that we have a secure connection.  Are you willing to proceed?" STAFF: Did the patient verbally acknowledge consent to telehealth visit? Document  YES/NO here: Yes   4. Advise patient to be prepared - "Two hours prior to your appointment, go ahead and check your blood pressure, pulse, oxygen saturation, and your weight (if you have the equipment to check those) and write them all down. When your visit starts, your provider will ask you for this information. If you have an Apple Watch or Kardia device, please plan to have heart rate information ready on the day of your appointment. Please have a pen and paper handy nearby the day of the visit as well."  5. Give patient instructions for MyChart download to smartphone OR Doximity/[email or web address] as below if video visit (depending on what platform provider is using)  6. Inform patient they will receive a phone call 15 minutes prior to their appointment time (may be from unknown caller ID) so they should be prepared to answer    TELEPHONE CALL NOTE  Carl [name] has been deemed a candidate for a follow-up tele-health visit to limit community exposure during the Covid-19 pandemic. I spoke with the patient via phone to ensure availability of phone/video source, confirm preferred email & phone number, and discuss instructions and expectations.  I reminded Carl [name] to be prepared with any vital sign and/or heart rhythm information that could potentially be obtained via home monitoring, at the time of his visit. I reminded Carl [name] to expect a phone call prior  to his visit.  Ace Gins 12/15/2018 2:53 PM   INSTRUCTIONS FOR DOWNLOADING THE MYCHART APP TO SMARTPHONE  - The patient must first make sure to have activated MyChart and know their login information - If Apple, go to [employer] and type in MyChart in the search bar and download the app. If Android, ask patient to go to [employer] and type in [place] in the search bar and download the app. The app is free but as with any other app downloads, their phone may require them to verify saved payment information or  Apple/Android password.  - The patient will need to then log into the app with their MyChart username and password, and select [facility] as their healthcare provider to link the account. When it is time for your visit, go to the MyChart app, find appointments, and click Begin Video Visit. Be sure to Select Allow for your device to access the Microphone and Camera for your visit. You will then be connected, and your provider will be with you shortly.  **If they have any issues connecting, or need assistance please contact MyChart service desk [phone])**  **If using a computer, in order to ensure the best quality for their visit they will need to use either of the following Internet Browsers: [employer], or Google Chrome**  IF USING DOXIMITY or DOXY.ME - The patient will receive a link just prior to their visit by text.     FULL LENGTH CONSENT FOR TELE-HEALTH VISIT   I hereby voluntarily request, consent and authorize [facility] and its employed or contracted physicians, physician assistants, nurse practitioners or other licensed health care professionals (the Practitioner), to provide me with telemedicine health care services (the Services") as deemed necessary by the treating Practitioner. I acknowledge and consent to receive the Services by the Practitioner via telemedicine. I understand that the telemedicine visit will involve communicating with the Practitioner through live audiovisual communication technology and the disclosure of certain medical information by electronic transmission. I acknowledge that I have been given the opportunity to request an in-person assessment or other available alternative prior to the telemedicine visit and am voluntarily participating in the telemedicine visit.  I understand that I have the right to withhold or withdraw my consent to the use of telemedicine in the course of my care at any time, without affecting my right to future care  or treatment, and that the Practitioner or I may terminate the telemedicine visit at any time. I understand that I have the right to inspect all information obtained and/or recorded in the course of the telemedicine visit and may receive copies of available information for a reasonable fee.  I understand that some of the potential risks of receiving the Services via telemedicine include:   Delay or interruption in medical evaluation due to technological equipment failure or disruption;  Information transmitted may not be sufficient (e.g. poor resolution of images) to allow for appropriate medical decision making by the Practitioner; and/or   In rare instances, security protocols could fail, causing a breach of personal health information.  Furthermore, I acknowledge that it is my responsibility to provide information about my medical history, conditions and care that is complete and accurate to the best of my ability. I acknowledge that Practitioner's advice, recommendations, and/or decision may be based on factors not within their control, such as incomplete or inaccurate data provided by me or distortions of diagnostic images or specimens that may result from electronic transmissions. I  understand that the practice of medicine is not an exact science and that Practitioner makes no warranties or guarantees regarding treatment outcomes. I acknowledge that I will receive a copy of this consent concurrently upon execution via email to the email address I last provided but may also request a printed copy by calling the office of [facility].    I understand that my insurance will be billed for this visit.   I have read or had this consent read to me.  I understand the contents of this consent, which adequately explains the benefits and risks of the Services being provided via telemedicine.   I have been provided ample opportunity to ask questions regarding this consent and the Services and have had  my questions answered to my satisfaction.  I give my informed consent for the services to be provided through the use of telemedicine in my medical care  By participating in this telemedicine visit I agree to the above.

## 2018-12-15 NOTE — Telephone Encounter (Signed)
Called and spoke with pt's daughter. Daughter stated that she has had issues with her phone that might be why they could not reach her. She has Dr. Donivan Scull phone number and will call them today.

## 2018-12-15 NOTE — Progress Notes (Signed)
Virtual Visit via Video Note   This visit type was conducted due to national recommendations for restrictions regarding the COVID-19 Pandemic (e.g. social distancing) in an effort to limit this patient's exposure and mitigate transmission in our community.  Due to his co-morbid illnesses, this patient is at least at moderate risk for complications without adequate follow up.  This format is felt to be most appropriate for this patient at this time.  All issues noted in this document were discussed and addressed.  A limited physical exam was performed with this format.  Please refer to the patient's chart for his consent to telehealth for [facility]. Evaluation Performed:  Follow-up visit  This visit type was conducted due to national recommendations for restrictions regarding the COVID-19 Pandemic (e.g. social distancing).  This format is felt to be most appropriate for this patient at this time.  All issues noted in this document were discussed and addressed.  No physical exam was performed (except for noted visual exam findings with Video Visits).  Please refer to the patient's chart (MyChart message for video visits and phone note for telephone visits) for the patient's consent to telehealth for [facility]. _____________   Date:  12/16/2018   Patient ID:  Carl [name], DOB 08-25-1935, MRN [id number] Patient Location:  Home Provider location:   Office  Primary Care Provider:  [name], MD Primary Cardiologist:  Ida Rogue, MD  Chief Complaint    83 year old male with a history of CAD status post 5 vessel bypass in 2006, ischemic cardiomyopathy with recovery of LV function, paroxysmal atrial fibrillation on Eliquis, atrial flutter, moderate mitral regurgitation, abdominal aortic aneurysm, stage III chronic kidney disease, nephrolithiasis, anemia of chronic disease, pulmonary nodule, hypertension, hyperlipidemia, orthostatic hypotension, and GERD, who presents for  follow-up of CAD.  Past Medical History    Past Medical History:  Diagnosis Date  . (HFpEF) heart failure with preserved ejection fraction ([place])    a. 08/2016 Echo: EF 50-55%.  [name] AAA (abdominal aortic aneurysm) ([place])    CT abd. dated 06-18-2018 in epic,  3.1cm;  followed by pcp  . Arthritis    arms  . Bladder calculus   . Chronic anemia   . Chronic anxiety   . CKD (chronic kidney disease), stage III ([place])   . Coronary artery disease CARDIOLOGIST-  DR Rockey Situ   a. CABG 2006 with  LIMA-LAD, SVG-Ramus, SVG-OM, seq SVG-PLV-PDA.  . DOE (dyspnea on exertion)   . GERD (gastroesophageal reflux disease)   . Hematuria   . History of bladder stone   . History of non-ST elevation myocardial infarction (NSTEMI) 03/30/2005   post op recovery inguinal hernia repair  . History of recurrent UTIs    last admission 08-04-2018 pseudomonas UTI  . Hyperlipidemia   . Hyperplasia of prostate without lower urinary tract symptoms (LUTS)   . Hypertension   . Ischemic cardiomyopathy    a. echo (01/13) 45-50% septal akinesis, mild MR; b. Echo  (01/16) ef 60-65%, mid-apical anteroseptal hypokinesis; c. echo 09-16-2016 ef 50-55%  . Left ureteral stone   . Lung nodule    noted 08/ 2018;  followed by pcp  . Moderate mitral regurgitation    a.08/2016 Echo: Mod MR/TR.  [name] Orthostatic hypotension   . PAC (premature atrial contraction)   . Paroxysmal atrial flutter ([place])    a. dx 08/2016, started on amiodarone.  . Persistent atrial fibrillation followed by cardiologist-- dr Rockey Situ   a. dx AFib 08/2016 s/p DCCV 10/2016-->eliquis (  CHA2DS2VASc = 5).  . Pulmonary hypertension ([place])    last echo in epic 09-16-2016  . Recurrent prostate cancer ([place]) UROLOGIST-- DR BELL   s/p  radioactive prostate seed implants in 1997;    due to PSA rising pt has been doing lupron injection's few past several yrs at dr bell office  . Renal calculi    bilateral per CT 06-18-2018  . S/P CABG x 5 04/04/2005   LIMA to LAD,  SVG to  Ramus,  SVG to OM,  seqSVG to PLV and PDA  . Wears glasses    Past Surgical History:  Procedure Laterality Date  . CARDIAC CATHETERIZATION  04/02/2005   dr Doreatha Lew    Critical three-vessel coronary disease --  Essentially normal left ventricular function , ef 55%  . CARDIOVASCULAR STRESS TEST  05-01-2008  dr Doreatha Lew /  dr harding   Low risk nuclear study w/ no ischemia or scar but flattened septal motion/  ef 64%  . CARDIOVERSION N/A 11/19/2016   Procedure: CARDIOVERSION;  Surgeon: Jerline Pain, MD;  Location: [facility];  Service: Cardiovascular;  Laterality: N/A;  . COLONOSCOPY N/A 10/23/2016   Procedure: COLONOSCOPY;  Surgeon: Milus Banister, MD;  Location: [facility];  Service: Endoscopy;  Laterality: N/A;  . CORONARY ARTERY BYPASS GRAFT  04/04/2005   dr  Ricard Dillon   LIMA - LAD,  SVG - Ramus, SVG - OM,  seqSVG - PLV and PDA  . CYSTOSCOPY WITH LITHOLAPAXY N/A 05/07/2016   Procedure: CYSTOSCOPY WITH LITHOLAPAXY;  Surgeon: Rana Snare, MD;  Location: [facility];  Service: Urology;  Laterality: N/A;  . CYSTOSCOPY WITH RETROGRADE PYELOGRAM, URETEROSCOPY AND STENT PLACEMENT  09/03/2018   Procedure: CYSTOSCOPY WITH RETROGRADE PYELOGRAM, URETEROSCOPY LASER LITHOTRIPSY AND STENT PLACEMENT;  Surgeon: Lucas Mallow, MD;  Location: [facility];  Service: Urology;;  . HOLMIUM LASER APPLICATION N/A 93/23/[id number]   Procedure: HOLMIUM LASER APPLICATION;  Surgeon: Rana Snare, MD;  Location: [facility];  Service: Urology;  Laterality: N/A;  . INGUINAL HERNIA REPAIR Left 03-30-2005  dr Excell Seltzer   incarcerated   . RADIOACTIVE PROSTATE SEED IMPLANTS  1997  . TOTAL HIP ARTHROPLASTY Right 10/07/2018   Procedure: TOTAL HIP ARTHROPLASTY ANTERIOR APPROACH;  Surgeon: Rod Can, MD;  Location: [facility];  Service: Orthopedics;  Laterality: Right;  . TRANSTHORACIC ECHOCARDIOGRAM  08/24/2014   mid anteroseptal and distal septak hypokinetic,  mild focal basal LVH, ef 60-65%,  grade 1  diastolic dysfunction/  mild MR/  mild LAE/  mild dilated RV with normal RVSP/  trivial TR    Allergies  Allergies  Allergen Reactions  . Ciprofloxacin Other (See Comments)    PER HEART DR    History of Present Illness    Carl [name] is a 83 y.o. male who presents via audio/video conferencing for a telehealth visit today.  As above, he has a complex past medical history with CAD status post 5 vessel bypass in 2006 including a LIMA to the LAD, vein graft to the ramus intermedius, vein graft to the obtuse marginal, and sequential vein graft to the RPL and RPDA.  Other history includes ischemic cardiomyopathy with an EF previously in the 45 to 50% range though this was 50-55% in 08/2016.  He has a history of paroxysmal atrial fibrillation and is anticoagulated with Eliquis, a brief episode of atrial flutter in August 2018, moderate mitral regurgitation, abdominal aortic aneurysm, stage III chronic kidney disease, nephrolithiasis, anemia of chronic disease, pulmonary nodule,  hypertension, hyperlipidemia, orthostatic hypotension, and GERD.  Medical management of his multiple comorbidities has been limited by orthostatic hypotension resulting in discontinuation of beta-blocker.  He was last seen in clinic in December 2019, at which time an echocardiogram was ordered and showed normal LV function with moderate mitral regurgitation.  Since his last visit he required hospitalization in March of this year due to weakness and fall with a finding of a mildly impacted acute fracture of the mid right femoral neck.  CT of the head also showed epidural hematoma versus loculated subdural hematoma without mass-effect or acute bleeding.  He was seen by Ortho and underwent left hip surgery on March 12.  He was also seen by neurosurgery with recommendation for holding Eliquis until cleared by neurosurgery.  He was subsequently discharged to skilled nursing facility.  Follow-up head CT showed an unchanged, small,  partially calcified lentiform extra-axial lesion about the left frontal lobe which was felt to be a small meningioma versus small, chronic hemorrhage.  He was apparently placed back on Eliquis while still at the skilled nursing facility but after discharge from skilled nursing, he was not taking it.  Follow-up blood work on May 5 showed stable H&H (he was anemic postoperatively at discharge in March), and cardiology follow-up was established to determine whether or not it was safe to resume Eliquis.  Patient's daughter joins him on the phone today.  He has been doing well from a cardiac standpoint without any chest pain, dyspnea, palpitations, PND, orthopnea, dizziness, syncope, edema, or early satiety.  He has been steady on his feet and has not had recurrent falls.  Though he has a history of hematuria, this has not recurred.  He is currently taking aspirin.  He has not had any significant hip pain.  The patient does not have symptoms concerning for COVID-19 infection (fever, chills, cough, or new shortness of breath).   Home Medications    Prior to Admission medications   Medication Sig Start Date End Date Taking? Authorizing Provider  acetaminophen (TYLENOL) 500 MG tablet Take 1,000 mg by mouth every 6 (six) hours as needed for mild pain (for pain).     [provider]  amiodarone (PACERONE) 200 MG tablet Take 1 tablet (200 mg total) by mouth daily. 07/12/18   Rise Mu, PA-C  aspirin 81 MG tablet Take 81 mg by mouth daily.    [provider]  omeprazole (PRILOSEC) 40 MG capsule Take 1 capsule (40 mg total) by mouth daily. 06/18/18   [name], [name], MD  opium-belladonna (B&O SUPPRETTES) 16.2-60 MG suppository Place 1 suppository rectally every 6 (six) hours as needed for bladder spasms. 08/09/18   Eugenie Filler, MD  rosuvastatin (CRESTOR) 40 MG tablet Take 1 tablet (40 mg total) by mouth daily. 06/18/18   [name], [name], MD  senna-docusate (SENOKOT-S)  8.6-50 MG tablet Take 1 tablet by mouth 2 (two) times daily. Patient taking differently: Take 1 tablet by mouth 2 (two) times daily as needed for mild constipation.  08/09/18   Eugenie Filler, MD  tamsulosin (FLOMAX) 0.4 MG CAPS capsule Take 1 capsule (0.4 mg total) by mouth daily after supper. 01/06/18   [name], MD    Review of Systems    As above, overall doing well.  He denies chest pain, palpitations, dyspnea, pnd, orthopnea, n, v, dizziness, syncope, edema, weight gain, or early satiety.  All other systems reviewed and are otherwise negative except as noted above.  Physical Exam  Vital Signs:  BP 133/73 (BP Location: Left Arm, Patient Position: Sitting)   Pulse 89   Ht 5\' 11"  (1.803 m)   Wt 149 lb 7 oz (67.8 kg)   BMI 20.84 kg/m    Phone visit limits exam.  Awake alert and oriented x3.  Hard of hearing.  No acute distress.  Speech is unlabored.  Accessory Clinical Findings    Lab Results  Component Value Date   CREATININE 1.04 10/11/2018   BUN 19 10/11/2018   NA 135 10/11/2018   K 4.1 10/11/2018   CL 104 10/11/2018   CO2 26 10/11/2018    Lab Results  Component Value Date   WBC 6.6 11/30/2018   HGB 11.8 (L) 11/30/2018   HCT 36.4 (L) 11/30/2018   MCV 95.6 11/30/2018   PLT 194.0 11/30/2018   Lab Results  Component Value Date   CHOL 130 07/12/2018   HDL 62 07/12/2018   LDLCALC 55 07/12/2018   TRIG 65 07/12/2018   CHOLHDL 2.1 07/12/2018   Lab Results  Component Value Date   ALT 25 10/05/2018   AST 20 10/05/2018   ALKPHOS 66 10/05/2018   BILITOT 0.5 10/05/2018    Assessment & Plan    1.  Coronary artery disease: Status post prior CABG.  He has been doing well without chest pain or dyspnea. He is currently on aspirin and statin however I am going to discontinue aspirin as I have checked with Dr. Noe Gens with regards to resumption of Eliquis and it is felt to be safe to resume Eliquis at this time given stable head CT.  2.  Ischemic  cardiomyopathy: EF 50 to 55% by echo in February 2018.  He has been doing well at home without any dyspnea, weight gain, or edema.  Therapy has been limited by history of orthostatic hypotension and therefore he is not on beta-blocker, acei/arb/arni/mra.  3.  Persistent atrial fibrillation/flutter: Stable on amiodarone.  Eliquis has been on hold in the setting of fall with concern for chronic hemorrhage on head CT.  I have reached out to Dr. Earnie Larsson today and following review of his 3 CTs in March, it is felt to be safe to resume Eliquis if indicated.  I am going to discontinue his aspirin and resume Eliquis.  Follow-up basic metabolic panel and CBC within the next 2 weeks given anemia during hospitalization and variable creatinine in the past, though creatinine has been under 1.5 on every check dating back to July 12, 2018.  4.  Stage III chronic kidney disease: Stable during recent hospitalization.  Plan to follow-up in 2 weeks.  5.  Hypertension/orthostatic hypotension: No current issues.  6.  Hyperlipidemia: LDL of 55 in December 2019 with normal LFTs in March.  Continue statin therapy.  7.  Abdominal aortic aneurysm: Noted on CT November 2019 with recommendation for follow-up in 3 years.  8.  Moderate mitral regurgitation: Asymptomatic.  Was supposed to have follow-up echo earlier this year.  We can look to reschedule this after seen back in clinic in a few months.  9.  Fall/Left frontal meningioma versus chronic hemorrhage: Mechanical fall earlier this year with hip fracture and abnormal head CT with concern for bleeding.  Follow-up CTs have been stable.  As above, I reached out to Dr. [name] Stable today and he feels it is safe to resume Eliquis.    10.  Disposition: Follow-up CBC and basic metabolic panel in 2 weeks.  Follow-up in clinic in 3 months or sooner  if necessary.  COVID-19 Education: The signs and symptoms of COVID-19 were discussed with the patient and how to seek care for testing  (follow up with PCP or arrange E-visit).  The importance of social distancing was discussed today.  Patient Risk:   After full review of this patient's history and clinical status, I feel that he is at least moderate risk for cardiac complications at this time, thus necessitating a telehealth visit sooner than our first available in office visit.  Time:   Today, I have spent 30 minutes with the patient with telehealth technology discussing medical history, symptoms, and management plan.    Murray Hodgkins, NP 12/16/2018, 12:48 PM

## 2018-12-16 ENCOUNTER — Encounter: Payer: Self-pay | Admitting: Nurse Practitioner

## 2018-12-16 ENCOUNTER — Other Ambulatory Visit: Payer: Self-pay

## 2018-12-16 ENCOUNTER — Telehealth (INDEPENDENT_AMBULATORY_CARE_PROVIDER_SITE_OTHER): Payer: Medicare Other | Admitting: Nurse Practitioner

## 2018-12-16 VITALS — BP 133/73 | HR 89 | Ht 71.0 in | Wt 149.4 lb

## 2018-12-16 DIAGNOSIS — I4892 Unspecified atrial flutter: Secondary | ICD-10-CM

## 2018-12-16 DIAGNOSIS — I4819 Other persistent atrial fibrillation: Secondary | ICD-10-CM

## 2018-12-16 DIAGNOSIS — I2510 Atherosclerotic heart disease of native coronary artery without angina pectoris: Secondary | ICD-10-CM | POA: Diagnosis not present

## 2018-12-16 DIAGNOSIS — I340 Nonrheumatic mitral (valve) insufficiency: Secondary | ICD-10-CM

## 2018-12-16 DIAGNOSIS — I951 Orthostatic hypotension: Secondary | ICD-10-CM

## 2018-12-16 DIAGNOSIS — N183 Chronic kidney disease, stage 3 unspecified: Secondary | ICD-10-CM

## 2018-12-16 DIAGNOSIS — I255 Ischemic cardiomyopathy: Secondary | ICD-10-CM

## 2018-12-16 DIAGNOSIS — I10 Essential (primary) hypertension: Secondary | ICD-10-CM

## 2018-12-16 DIAGNOSIS — E782 Mixed hyperlipidemia: Secondary | ICD-10-CM

## 2018-12-16 MED ORDER — APIXABAN 5 MG PO TABS: 5.0000 mg | ORAL_TABLET | Freq: Two times a day (BID) | ORAL | 6 refills | Status: DC

## 2018-12-16 NOTE — Patient Instructions (Signed)
It was a pleasure to speak with you on the phone today! Thank you for allowing [place] to continue taking care of your [facility] needs during this time.   Feel free to call as needed for questions and concerns related to your cardiac needs.   Medication Instructions:  Your physician has recommended you make the following change in your medication:  1- STOP daily Aspirin 2- RESTART Eliquis Take 1 tablet (5 mg total) by mouth 2 (two) times daily. We touched based with neurosurg, okay to restart.  If you need a refill on your cardiac medications before your next appointment, please call your pharmacy.   Lab work: Your physician recommends that you return for lab work in: 2 weeks at the medical mall. No appt is needed. Hours are M-F 7AM- 6 PM.  If you have labs (blood work) drawn today and your tests are completely normal, you will receive your results only by: [name] MyChart Message (if you have MyChart) OR . A paper copy in the mail If you have any lab test that is abnormal or we need to change your treatment, we will call you to review the results.  Testing/Procedures: None ordered  Follow-Up: At [facility], you and your health needs are our priority.  As part of our continuing mission to provide you with exceptional heart care, we have created designated Provider Care Teams.  These Care Teams include your primary Cardiologist (physician) and Advanced Practice Providers (APPs -  Physician Assistants and Nurse Practitioners) who all work together to provide you with the care you need, when you need it. You will need a follow up virtual appointment in 6 months.  Please call our office 2 months in advance to schedule this appointment.  You may see Ida Rogue, MD or Murray Hodgkins, NP.

## 2019-01-19 ENCOUNTER — Telehealth: Payer: Self-pay

## 2019-01-19 NOTE — Telephone Encounter (Signed)
   COVID-19 Pre-Screening Questions:  . In the past 7 to 10 days have you had a cough,  shortness of breath, headache, congestion, fever (100 or greater) body aches, chills, sore throat, or sudden loss of taste or sense of smell? NO . Have you been around anyone with known Covid 19? NO . Have you been around anyone who is awaiting Covid 19 test results in the past 7 to 10 days? NO . Have you been around anyone who has been exposed to Covid 19, or has mentioned symptoms of Covid 19 within the past 7 to 10 days? NO  If you have any concerns/questions about symptoms patients report during screening (either on the phone or at threshold). Contact the provider seeing the patient or DOD for further guidance.  If neither are available contact a member of the leadership team.   Answered by daughter with patient present.

## 2019-01-20 ENCOUNTER — Other Ambulatory Visit: Payer: Medicare Other

## 2019-02-02 ENCOUNTER — Other Ambulatory Visit: Payer: Self-pay

## 2019-02-02 DIAGNOSIS — R109 Unspecified abdominal pain: Secondary | ICD-10-CM

## 2019-02-02 MED ORDER — OMEPRAZOLE 40 MG PO CPDR: 40.0000 mg | DELAYED_RELEASE_CAPSULE | Freq: Every day | ORAL | 1 refills | Status: DC

## 2019-02-02 NOTE — Progress Notes (Signed)
Refill for omeprazole sent to [facility] per request.  Nina,cma

## 2019-02-14 ENCOUNTER — Other Ambulatory Visit (HOSPITAL_COMMUNITY): Payer: Self-pay | Admitting: Nurse Practitioner

## 2019-02-14 DIAGNOSIS — R319 Hematuria, unspecified: Secondary | ICD-10-CM

## 2019-02-14 DIAGNOSIS — N3000 Acute cystitis without hematuria: Secondary | ICD-10-CM

## 2019-02-14 DIAGNOSIS — N390 Urinary tract infection, site not specified: Secondary | ICD-10-CM

## 2019-02-15 ENCOUNTER — Other Ambulatory Visit (HOSPITAL_COMMUNITY): Payer: Medicare Other

## 2019-02-18 ENCOUNTER — Other Ambulatory Visit (HOSPITAL_COMMUNITY): Payer: Medicare Other

## 2019-02-22 ENCOUNTER — Other Ambulatory Visit (HOSPITAL_COMMUNITY): Payer: Medicare Other

## 2019-02-25 ENCOUNTER — Emergency Department (HOSPITAL_COMMUNITY)
Admission: EM | Admit: 2019-02-25 | Discharge: 2019-02-25 | Disposition: A | Discharge status: Home / Self Care | Payer: Medicare Other | Attending: Emergency Medicine | Admitting: Emergency Medicine

## 2019-02-25 ENCOUNTER — Emergency Department (HOSPITAL_COMMUNITY): Payer: Medicare Other

## 2019-02-25 ENCOUNTER — Encounter (HOSPITAL_COMMUNITY): Payer: Self-pay

## 2019-02-25 DIAGNOSIS — I129 Hypertensive chronic kidney disease with stage 1 through stage 4 chronic kidney disease, or unspecified chronic kidney disease: Secondary | ICD-10-CM | POA: Diagnosis not present

## 2019-02-25 DIAGNOSIS — R4182 Altered mental status, unspecified: Secondary | ICD-10-CM | POA: Insufficient documentation

## 2019-02-25 DIAGNOSIS — Z96641 Presence of right artificial hip joint: Secondary | ICD-10-CM | POA: Diagnosis not present

## 2019-02-25 DIAGNOSIS — N183 Chronic kidney disease, stage 3 (moderate): Secondary | ICD-10-CM | POA: Diagnosis not present

## 2019-02-25 DIAGNOSIS — Z79899 Other long term (current) drug therapy: Secondary | ICD-10-CM | POA: Diagnosis not present

## 2019-02-25 DIAGNOSIS — N390 Urinary tract infection, site not specified: Secondary | ICD-10-CM

## 2019-02-25 DIAGNOSIS — Z951 Presence of aortocoronary bypass graft: Secondary | ICD-10-CM | POA: Diagnosis not present

## 2019-02-25 DIAGNOSIS — Z9181 History of falling: Secondary | ICD-10-CM | POA: Diagnosis not present

## 2019-02-25 DIAGNOSIS — W19XXXA Unspecified fall, initial encounter: Secondary | ICD-10-CM

## 2019-02-25 DIAGNOSIS — Z87891 Personal history of nicotine dependence: Secondary | ICD-10-CM | POA: Diagnosis not present

## 2019-02-25 DIAGNOSIS — M25511 Pain in right shoulder: Secondary | ICD-10-CM | POA: Insufficient documentation

## 2019-02-25 DIAGNOSIS — I2510 Atherosclerotic heart disease of native coronary artery without angina pectoris: Secondary | ICD-10-CM | POA: Insufficient documentation

## 2019-02-25 DIAGNOSIS — Z7901 Long term (current) use of anticoagulants: Secondary | ICD-10-CM | POA: Insufficient documentation

## 2019-02-25 LAB — URINALYSIS, ROUTINE W REFLEX MICROSCOPIC
Bacteria, UA: NONE SEEN
Bilirubin Urine: NEGATIVE
Glucose, UA: NEGATIVE mg/dL
Ketones, ur: NEGATIVE mg/dL
Nitrite: NEGATIVE
Protein, ur: 30 mg/dL — AB
RBC / HPF: >50 RBC/hpf — ABNORMAL HIGH (ref 0–5)
Specific Gravity, Urine: 1.019 (ref 1.005–1.030)
WBC, UA: >50 WBC/hpf — ABNORMAL HIGH (ref 0–5)
pH: 5 (ref 5.0–8.0)

## 2019-02-25 LAB — CBC WITH DIFFERENTIAL/PLATELET
Abs Immature Granulocytes: 0.02 10*3/uL (ref 0.00–0.07)
Basophils Absolute: 0 10*3/uL (ref 0.0–0.1)
Basophils Relative: 0 %
Eosinophils Absolute: 0 10*3/uL (ref 0.0–0.5)
Eosinophils Relative: 1 %
HCT: 35.3 % — ABNORMAL LOW (ref 39.0–52.0)
Hemoglobin: 10.6 g/dL — ABNORMAL LOW (ref 13.0–17.0)
Immature Granulocytes: 0 %
Lymphocytes Relative: 13 %
Lymphs Abs: 0.9 10*3/uL (ref 0.7–4.0)
MCH: 29.1 pg (ref 26.0–34.0)
MCHC: 30 g/dL (ref 30.0–36.0)
MCV: 97 fL (ref 80.0–100.0)
Monocytes Absolute: 0.5 10*3/uL (ref 0.1–1.0)
Monocytes Relative: 8 %
Neutro Abs: 5.3 10*3/uL (ref 1.7–7.7)
Neutrophils Relative %: 78 %
Platelets: 224 10*3/uL (ref 150–400)
RBC: 3.64 MIL/uL — ABNORMAL LOW (ref 4.22–5.81)
RDW: 15.1 % (ref 11.5–15.5)
WBC: 6.8 10*3/uL (ref 4.0–10.5)
nRBC: 0 % (ref 0.0–0.2)

## 2019-02-25 LAB — COMPREHENSIVE METABOLIC PANEL
ALT: 26 U/L (ref 0–44)
AST: 31 U/L (ref 15–41)
Albumin: 2.9 g/dL — ABNORMAL LOW (ref 3.5–5.0)
Alkaline Phosphatase: 79 U/L (ref 38–126)
Anion gap: 6 (ref 5–15)
BUN: 29 mg/dL — ABNORMAL HIGH (ref 8–23)
CO2: 26 mmol/L (ref 22–32)
Calcium: 8.7 mg/dL — ABNORMAL LOW (ref 8.9–10.3)
Chloride: 111 mmol/L (ref 98–111)
Creatinine, Ser: 1.54 mg/dL — ABNORMAL HIGH (ref 0.61–1.24)
GFR calc Af Amer: 48 mL/min — ABNORMAL LOW (ref >60)
GFR calc non Af Amer: 42 mL/min — ABNORMAL LOW (ref >60)
Glucose, Bld: 107 mg/dL — ABNORMAL HIGH (ref 70–99)
Potassium: 4 mmol/L (ref 3.5–5.1)
Sodium: 143 mmol/L (ref 135–145)
Total Bilirubin: 0.5 mg/dL (ref 0.3–1.2)
Total Protein: 6.5 g/dL (ref 6.5–8.1)

## 2019-02-25 MED ORDER — TRAMADOL HCL 50 MG PO TABS
50.0000 mg | ORAL_TABLET | Freq: Once | ORAL | Status: AC
  Administered 2019-02-25: 50 mg via ORAL
  Filled 2019-02-25: qty 1

## 2019-02-25 MED ORDER — ACETAMINOPHEN 325 MG PO TABS
650.0000 mg | ORAL_TABLET | ORAL | Status: DC | PRN
  Administered 2019-02-25: 650 mg via ORAL
  Filled 2019-02-25: qty 2

## 2019-02-25 MED ORDER — SODIUM CHLORIDE 0.9 % IV BOLUS
500.0000 mL | Freq: Once | INTRAVENOUS | Status: AC
  Administered 2019-02-25: 500 mL via INTRAVENOUS

## 2019-02-25 NOTE — ED Notes (Signed)
Called annette, Daughter to update on plan of care. Daughter states Jackqulyn Livings is already on the way to get patient.

## 2019-02-25 NOTE — ED Notes (Signed)
DG at bedside. 

## 2019-02-25 NOTE — ED Notes (Signed)
Lita Mains, MD made aware patient in pain.

## 2019-02-25 NOTE — ED Triage Notes (Signed)
Patient arrived via [facility] from home.  Patient fell from standing height.  C/o right shoulder pain and right lower back pain.    Family wanted patient come to ED because patient has had multiple falls lately.    Patient uses walker off and on.    Patient is being treated for UTI. IV antibiotics at home through IV. Wednesday to Wednesday. IV last placed yesterday.    A/Ox4 [id number]

## 2019-02-25 NOTE — ED Notes (Signed)
Daughter at bedside.

## 2019-02-25 NOTE — ED Provider Notes (Signed)
[facility] DEPT Provider Note   CSN: [id number] Arrival date & time: 02/25/19  1411    History   Chief Complaint Chief Complaint  Patient presents with  . Fall  . Shoulder Pain    HPI Carl [name] is a 83 y.o. male.     HPI Patient is on IV antibiotics for a urinary tract infection.  States he is been on this for roughly 1 week.  He has had 3 falls over this period.  Does not remember hitting his head.  Has been increasingly unsteady in his gait.  He does use a walker occasionally.  Patient also endorses cough with production of thick yellow sputum.  Denies fever or chills.  Denies focal weakness or numbness.  Complains of mild right sided shoulder and right thoracic back pain which is worse with breathing. Past Medical History:  Diagnosis Date  . (HFpEF) heart failure with preserved ejection fraction ([place])    a. 08/2016 Echo: EF 50-55%.  [name] AAA (abdominal aortic aneurysm) ([place])    CT abd. dated 06-18-2018 in epic,  3.1cm;  followed by pcp  . Arthritis    arms  . Bladder calculus   . Chronic anemia   . Chronic anxiety   . CKD (chronic kidney disease), stage III ([place])   . Coronary artery disease CARDIOLOGIST-  DR Rockey Situ   a. CABG 2006 with  LIMA-LAD, SVG-Ramus, SVG-OM, seq SVG-PLV-PDA.  . DOE (dyspnea on exertion)   . GERD (gastroesophageal reflux disease)   . Hematuria   . History of bladder stone   . History of non-ST elevation myocardial infarction (NSTEMI) 03/30/2005   post op recovery inguinal hernia repair  . History of recurrent UTIs    last admission 08-04-2018 pseudomonas UTI  . Hyperlipidemia   . Hyperplasia of prostate without lower urinary tract symptoms (LUTS)   . Hypertension   . Ischemic cardiomyopathy    a. echo (01/13) 45-50% septal akinesis, mild MR; b. Echo  (01/16) ef 60-65%, mid-apical anteroseptal hypokinesis; c. echo 09-16-2016 ef 50-55%  . Left ureteral stone   . Lung nodule    noted 08/ 2018;  followed by  pcp  . Moderate mitral regurgitation    a.08/2016 Echo: Mod MR/TR.  [name] Orthostatic hypotension   . PAC (premature atrial contraction)   . Paroxysmal atrial flutter ([place])    a. dx 08/2016, started on amiodarone.  . Persistent atrial fibrillation followed by cardiologist-- dr Rockey Situ   a. dx AFib 08/2016 s/p DCCV 10/2016-->eliquis (CHA2DS2VASc = 5).  . Pulmonary hypertension ([place])    last echo in epic 09-16-2016  . Recurrent prostate cancer ([place]) UROLOGIST-- DR BELL   s/p  radioactive prostate seed implants in 1997;    due to PSA rising pt has been doing lupron injection's few past several yrs at dr bell office  . Renal calculi    bilateral per CT 06-18-2018  . S/P CABG x 5 04/04/2005   LIMA to LAD,  SVG to Ramus,  SVG to OM,  seqSVG to PLV and PDA  . Wears glasses     Patient Active Problem List   Diagnosis Date Noted  . Displaced fracture of right femoral neck ([place]) 10/07/2018  . Hip fracture ([place]) 10/05/2018  . Intracranial hematoma ([place]) 10/05/2018  . AAA (abdominal aortic aneurysm) ([place]) 08/26/2018  . Pseudomonas aeruginosa infection 08/06/2018  . Acute cystitis with hematuria   . Pseudomonas urinary tract infection 08/05/2018  . Hematuria 08/05/2018  . Pressure injury of  skin 08/05/2018  . Kidney stones 06/30/2018  . Allergic rhinitis 04/26/2018  . Skin tear of elbow without complication 24/23/5361  . Complicated UTI (urinary tract infection) 01/05/2018  . Urolithiasis 01/05/2018  . Frequent urination 01/01/2018  . Colon polyps 09/15/2017  . GERD (gastroesophageal reflux disease) 09/15/2017  . Wrist pain, acute, right 05/25/2017  . Low back pain 03/24/2017  . Lung nodule seen on imaging study: 7 mm nodule left midlung 03/13/2017  . Physical deconditioning 03/13/2017  . Normocytic anemia 03/13/2017  . Fall 03/12/2017  . Abdominal soreness 03/12/2017  . Generalized weakness 03/12/2017  . Leukocytosis 03/12/2017  . Hyponatremia 03/12/2017  . Orthostatic hypotension  02/16/2017  . Blood in stool 09/19/2016  . Anticoagulated 09/19/2016  . History of prostate cancer 09/04/2016  . Persistent atrial fibrillation 09/04/2016  . Acute congestive heart failure ([place]) 09/04/2016  . Cardiomyopathy, ischemic 07/16/2012  . Chronic kidney disease (CKD), stage III (moderate) ([place]) 07/16/2012  . Essential hypertension 08/05/2011  . Murmur 08/05/2011  . 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 Dyslipidemia 02/04/2011    Past Surgical History:  Procedure Laterality Date  . CARDIAC CATHETERIZATION  04/02/2005   dr Doreatha Lew    Critical three-vessel coronary disease --  Essentially normal left ventricular function , ef 55%  . CARDIOVASCULAR STRESS TEST  05-01-2008  dr Doreatha Lew /  dr harding   Low risk nuclear study w/ no ischemia or scar but flattened septal motion/  ef 64%  . CARDIOVERSION N/A 11/19/2016   Procedure: CARDIOVERSION;  Surgeon: Jerline Pain, MD;  Location: [facility];  Service: Cardiovascular;  Laterality: N/A;  . COLONOSCOPY N/A 10/23/2016   Procedure: COLONOSCOPY;  Surgeon: Milus Banister, MD;  Location: [facility];  Service: Endoscopy;  Laterality: N/A;  . CORONARY ARTERY BYPASS GRAFT  04/04/2005   dr  Ricard Dillon   LIMA - LAD,  SVG - Ramus, SVG - OM,  seqSVG - PLV and PDA  . CYSTOSCOPY WITH LITHOLAPAXY N/A 05/07/2016   Procedure: CYSTOSCOPY WITH LITHOLAPAXY;  Surgeon: Rana Snare, MD;  Location: [facility];  Service: Urology;  Laterality: N/A;  . CYSTOSCOPY WITH RETROGRADE PYELOGRAM, URETEROSCOPY AND STENT PLACEMENT  09/03/2018   Procedure: CYSTOSCOPY WITH RETROGRADE PYELOGRAM, URETEROSCOPY LASER LITHOTRIPSY AND STENT PLACEMENT;  Surgeon: Lucas Mallow, MD;  Location: [facility];  Service: Urology;;  . HOLMIUM LASER APPLICATION N/A 44/31/5400   Procedure: HOLMIUM LASER APPLICATION;  Surgeon: Rana Snare, MD;  Location: [facility];  Service: Urology;  Laterality: N/A;  . INGUINAL HERNIA REPAIR Left 03-30-2005  dr Excell Seltzer    incarcerated   . RADIOACTIVE PROSTATE SEED IMPLANTS  1997  . TOTAL HIP ARTHROPLASTY Right 10/07/2018   Procedure: TOTAL HIP ARTHROPLASTY ANTERIOR APPROACH;  Surgeon: Rod Can, MD;  Location: [facility];  Service: Orthopedics;  Laterality: Right;  . TRANSTHORACIC ECHOCARDIOGRAM  08/24/2014   mid anteroseptal and distal septak hypokinetic,  mild focal basal LVH, ef 60-65%,  grade 1 diastolic dysfunction/  mild MR/  mild LAE/  mild dilated RV with normal RVSP/  trivial TR        Home Medications    Prior to Admission medications   Medication Sig Start Date End Date Taking? Authorizing Provider  acetaminophen (TYLENOL) 500 MG tablet Take 1,000 mg by mouth every 6 (six) hours as needed for mild pain (for pain).     [provider]  amiodarone (PACERONE) 200 MG tablet Take 1 tablet (200 mg total) by mouth daily. 07/12/18  Rise Mu, PA-C  apixaban (ELIQUIS) 5 MG TABS tablet Take 1 tablet (5 mg total) by mouth 2 (two) times daily. 12/16/18   [name], NP  omeprazole (PRILOSEC) 40 MG capsule Take 1 capsule (40 mg total) by mouth daily. 02/02/19   [name], MD  rosuvastatin (CRESTOR) 40 MG tablet Take 1 tablet (40 mg total) by mouth daily. 06/18/18   [name], [name], MD  senna-docusate (SENOKOT-S) 8.6-50 MG tablet Take 1 tablet by mouth 2 (two) times daily. Patient taking differently: Take 1 tablet by mouth 2 (two) times daily as needed for mild constipation.  08/09/18   Eugenie Filler, MD  tamsulosin (FLOMAX) 0.4 MG CAPS capsule Take 1 capsule (0.4 mg total) by mouth daily after supper. 01/06/18   [name], MD    Family History Family History  Problem Relation Age of Onset  . Heart disease Mother   . Cancer Mother   . Cancer Father   . Cancer Brother     Social History Social History   Tobacco Use  . Smoking status: Former Smoker    Years: 2.00    Types: Cigarettes    Quit date: 07/28/1960    Years since quitting: 58.6  . Smokeless  tobacco: Never Used  Substance Use Topics  . Alcohol use: No  . Drug use: No     Allergies   Ciprofloxacin   Review of Systems Review of Systems  Constitutional: Negative for chills and fever.  HENT: Negative for sore throat and trouble swallowing.   Eyes: Negative for visual disturbance.  Respiratory: Positive for cough. Negative for shortness of breath.   Cardiovascular: Negative for chest pain, palpitations and leg swelling.  Gastrointestinal: Negative for abdominal pain, constipation, diarrhea, nausea and vomiting.  Musculoskeletal: Positive for arthralgias and gait problem. Negative for myalgias and neck pain.  Skin: Negative for rash and wound.  Neurological: Negative for dizziness, syncope, weakness, light-headedness, numbness and headaches.  All other systems reviewed and are negative.    Physical Exam Updated Vital Signs BP (!) 158/79   Pulse 68   Temp 98.5 F (36.9 C) (Oral)   Resp 13   SpO2 99%   Physical Exam Vitals signs and nursing note reviewed.  Constitutional:      Appearance: Normal appearance. He is well-developed.  HENT:     Head: Normocephalic and atraumatic.     Comments: No obvious head trauma.  Cranial nerves II through XII grossly intact. Eyes:     Extraocular Movements: Extraocular movements intact.     Pupils: Pupils are equal, round, and reactive to light.  Neck:     Musculoskeletal: Normal range of motion and neck supple. No neck rigidity or muscular tenderness.  Cardiovascular:     Rate and Rhythm: Normal rate and regular rhythm.     Heart sounds: No murmur. No friction rub. No gallop.   Pulmonary:     Effort: Pulmonary effort is normal.     Comments: Diminished breath sounds in the right base.  Abdominal:     General: Bowel sounds are normal.     Palpations: Abdomen is soft.     Tenderness: There is no abdominal tenderness. There is no guarding or rebound.  Musculoskeletal: Normal range of motion.        General: Tenderness  present. No swelling, deformity or signs of injury.     Right lower leg: No edema.     Left lower leg: No edema.  Comments: Patient has mild tenderness to palpation over the lateral right deltoid.  There is no obvious deformity.  Full range of motion.  Patient has some bruising to the right posterior thoracic back.  No crepitance or deformity.  Lymphadenopathy:     Cervical: No cervical adenopathy.  Skin:    General: Skin is warm and dry.     Capillary Refill: Capillary refill takes less than 2 seconds.     Findings: No erythema or rash.  Neurological:     General: No focal deficit present.     Mental Status: He is alert and oriented to person, place, and time.     Comments: 5/5 motor in all extremities.  Sensation fully intact.  Psychiatric:        Behavior: Behavior normal.      ED Treatments / Results  Labs (all labs ordered are listed, but only abnormal results are displayed) Labs Reviewed  CBC WITH DIFFERENTIAL/PLATELET - Abnormal; Notable for the following components:      Result Value   RBC 3.64 (*)    Hemoglobin 10.6 (*)    HCT 35.3 (*)    All other components within normal limits  COMPREHENSIVE METABOLIC PANEL - Abnormal; Notable for the following components:   Glucose, Bld 107 (*)    BUN 29 (*)    Creatinine, Ser 1.54 (*)    Calcium 8.7 (*)    Albumin 2.9 (*)    GFR calc non Af Amer 42 (*)    GFR calc Af Amer 48 (*)    All other components within normal limits  URINALYSIS, ROUTINE W REFLEX MICROSCOPIC - Abnormal; Notable for the following components:   Color, Urine YELLOW (*)    APPearance CLOUDY (*)    Hgb urine dipstick LARGE (*)    Protein, ur 30 (*)    Leukocytes,Ua LARGE (*)    RBC / HPF >50 (*)    WBC, UA >50 (*)    All other components within normal limits  URINE CULTURE    EKG EKG Interpretation  Date/Time:  Friday February 25 2019 15:20:05 EDT Ventricular Rate:  72 PR Interval:    QRS Duration: 106 QT Interval:  441 QTC Calculation: 483 R  Axis:   -17 Text Interpretation:  Sinus rhythm Borderline prolonged PR interval Borderline left axis deviation Low voltage, precordial leads Borderline repolarization abnormality Borderline ST elevation, lateral leads Borderline prolonged QT interval Baseline wander in lead(s) V4 V6 Confirmed by Julianne Rice [phone]) on 02/25/2019 6:31:04 PM   Radiology Dg Chest 2 View  Result Date: 02/25/2019 CLINICAL DATA:  Cough EXAM: CHEST - 2 VIEW COMPARISON:  10/05/2018 FINDINGS: Postsurgical changes to the sternum and mediastinum. Heart size is normal. Calcific aortic knob. No focal airspace consolidation, pleural effusion, or pneumothorax. Interval removal of a right-sided PICC line. IMPRESSION: No active cardiopulmonary disease. Electronically Signed   By: Davina Poke M.D.   On: 02/25/2019 15:58   Dg Shoulder Right  Result Date: 02/25/2019 CLINICAL DATA:  Status post fall x3 in the past 24 hours. Right shoulder pain. Initial encounter. EXAM: RIGHT SHOULDER - 2+ VIEW COMPARISON:  None. FINDINGS: There is no acute bony or joint abnormality. Acromioclavicular osteoarthritis is noted. Soft tissues unremarkable. IMPRESSION: No acute abnormality. Acromioclavicular osteoarthritis. Electronically Signed   By: Inge Rise M.D.   On: 02/25/2019 14:45   Ct Head Wo Contrast  Result Date: 02/25/2019 CLINICAL DATA:  Altered level of consciousness.  Fall. EXAM: CT HEAD WITHOUT CONTRAST TECHNIQUE: Contiguous axial  images were obtained from the base of the skull through the vertex without intravenous contrast. COMPARISON:  CT head 10/26/2018 FINDINGS: Brain: Mild atrophy. Hypodensity in the white matter bilaterally consistent with chronic microvascular ischemia unchanged. No acute infarct or hemorrhage 10 x 20 mm extra-axial mass left frontal region unchanged. Slight calcification. This is most consistent with meningioma. No midline shift. Vascular: Negative for hyperdense vessel Skull: Negative Sinuses/Orbits:  Mild mucosal edema paranasal sinuses. Negative orbit Other: None IMPRESSION: Atrophy and chronic microvascular ischemia. No acute infarct or hemorrhage 10 x 20 mm extra-axial mass left frontal region unchanged most likely meningioma. Electronically Signed   By: Franchot Gallo M.D.   On: 02/25/2019 15:48    Procedures Procedures (including critical care time)  Medications Ordered in ED Medications  acetaminophen (TYLENOL) tablet 650 mg (650 mg Oral Given 02/25/19 1723)  sodium chloride 0.9 % bolus 500 mL (0 mLs Intravenous Stopped 02/25/19 1823)  traMADol (ULTRAM) tablet 50 mg (50 mg Oral Given 02/25/19 1738)     Initial Impression / Assessment and Plan / ED Course  I have reviewed the triage vital signs and the nursing notes.  Pertinent labs & imaging results that were available during my care of the patient were reviewed by me and considered in my medical decision making (see chart for details).        CT head without acute findings.  No obvious fracture to the shoulder or rib fractures.  Patient is on ertapenem for urinary tract infection which should be adequate to cover.  Will send urine for culture.  Mild elevation in creatinine likely due to dehydration.  Given IV fluids in the emergency department.  Will discharge home with family.  Final Clinical Impressions(s) / ED Diagnoses   Final diagnoses:  Fall, initial encounter  Acute pain of right shoulder  Urinary tract infection without hematuria, site unspecified    ED Discharge Orders    None       Julianne Rice, MD 02/25/19 [phone]

## 2019-02-28 LAB — URINE CULTURE: Culture: 30000 — AB

## 2019-03-01 ENCOUNTER — Telehealth: Payer: Self-pay

## 2019-03-01 NOTE — Telephone Encounter (Signed)
Post ED Visit - Positive Culture Follow-up  Culture report reviewed by antimicrobial stewardship pharmacist: [facility] Team []  Elenor Quinones, Pharm.D. []  Heide Guile, Pharm.D., BCPS AQ-ID []  Parks Neptune, Pharm.D., BCPS []  Alycia Rossetti, Pharm.D., BCPS []  [place], Pharm.D., BCPS, AAHIVP []  Legrand Como, Pharm.D., BCPS, AAHIVP []  Salome Arnt, PharmD, BCPS []  Johnnette Gourd, PharmD, BCPS []  Hughes Better, PharmD, BCPS []  Leeroy Cha, PharmD []  Laqueta Linden, PharmD, BCPS []  Albertina Parr, PharmD  [facility] Team []  Leodis Sias, PharmD []  Lindell Spar, PharmD []  Royetta Asal, PharmD []  Graylin Shiver, Rph []  Rema Fendt) Glennon Mac, PharmD []  Arlyn Dunning, PharmD []  Netta Cedars, PharmD []  Dia Sitter, PharmD []  [name], PharmD []  Gretta Arab, PharmD []  Theodis Shove, PharmD []  Peggyann Juba, PharmD [x]  Reuel Boom, PharmD   Positive urine culture  Already on out patient treatment  and no further patient follow-up is required at this time.  Carl [name] 03/01/2019, 10:37 AM

## 2019-03-02 ENCOUNTER — Observation Stay (HOSPITAL_COMMUNITY)
Admission: EM | Admit: 2019-03-02 | Discharge: 2019-03-04 | Disposition: A | Discharge status: Home / Self Care | Payer: Medicare Other | Attending: Internal Medicine | Admitting: Internal Medicine

## 2019-03-02 ENCOUNTER — Encounter (HOSPITAL_COMMUNITY): Payer: Self-pay | Admitting: Family Medicine

## 2019-03-02 ENCOUNTER — Telehealth: Payer: Self-pay | Admitting: Family Medicine

## 2019-03-02 ENCOUNTER — Emergency Department (HOSPITAL_COMMUNITY): Payer: Medicare Other

## 2019-03-02 DIAGNOSIS — Z87891 Personal history of nicotine dependence: Secondary | ICD-10-CM | POA: Diagnosis not present

## 2019-03-02 DIAGNOSIS — R410 Disorientation, unspecified: Secondary | ICD-10-CM

## 2019-03-02 DIAGNOSIS — Z8249 Family history of ischemic heart disease and other diseases of the circulatory system: Secondary | ICD-10-CM | POA: Insufficient documentation

## 2019-03-02 DIAGNOSIS — I4819 Other persistent atrial fibrillation: Secondary | ICD-10-CM | POA: Diagnosis not present

## 2019-03-02 DIAGNOSIS — Z20828 Contact with and (suspected) exposure to other viral communicable diseases: Secondary | ICD-10-CM | POA: Insufficient documentation

## 2019-03-02 DIAGNOSIS — R4182 Altered mental status, unspecified: Secondary | ICD-10-CM

## 2019-03-02 DIAGNOSIS — I5032 Chronic diastolic (congestive) heart failure: Secondary | ICD-10-CM | POA: Diagnosis not present

## 2019-03-02 DIAGNOSIS — I10 Essential (primary) hypertension: Secondary | ICD-10-CM | POA: Diagnosis present

## 2019-03-02 DIAGNOSIS — R531 Weakness: Secondary | ICD-10-CM | POA: Diagnosis present

## 2019-03-02 DIAGNOSIS — Z8546 Personal history of malignant neoplasm of prostate: Secondary | ICD-10-CM | POA: Insufficient documentation

## 2019-03-02 DIAGNOSIS — I252 Old myocardial infarction: Secondary | ICD-10-CM | POA: Insufficient documentation

## 2019-03-02 DIAGNOSIS — D631 Anemia in chronic kidney disease: Secondary | ICD-10-CM | POA: Diagnosis not present

## 2019-03-02 DIAGNOSIS — I714 Abdominal aortic aneurysm, without rupture: Secondary | ICD-10-CM | POA: Diagnosis not present

## 2019-03-02 DIAGNOSIS — Z809 Family history of malignant neoplasm, unspecified: Secondary | ICD-10-CM | POA: Insufficient documentation

## 2019-03-02 DIAGNOSIS — R2689 Other abnormalities of gait and mobility: Secondary | ICD-10-CM | POA: Diagnosis not present

## 2019-03-02 DIAGNOSIS — I255 Ischemic cardiomyopathy: Secondary | ICD-10-CM | POA: Diagnosis not present

## 2019-03-02 DIAGNOSIS — Z7901 Long term (current) use of anticoagulants: Secondary | ICD-10-CM | POA: Insufficient documentation

## 2019-03-02 DIAGNOSIS — I4811 Longstanding persistent atrial fibrillation: Secondary | ICD-10-CM | POA: Diagnosis present

## 2019-03-02 DIAGNOSIS — I2510 Atherosclerotic heart disease of native coronary artery without angina pectoris: Secondary | ICD-10-CM | POA: Insufficient documentation

## 2019-03-02 DIAGNOSIS — Z951 Presence of aortocoronary bypass graft: Secondary | ICD-10-CM | POA: Diagnosis not present

## 2019-03-02 DIAGNOSIS — E860 Dehydration: Secondary | ICD-10-CM | POA: Diagnosis present

## 2019-03-02 DIAGNOSIS — N400 Enlarged prostate without lower urinary tract symptoms: Secondary | ICD-10-CM | POA: Diagnosis not present

## 2019-03-02 DIAGNOSIS — Z881 Allergy status to other antibiotic agents status: Secondary | ICD-10-CM | POA: Insufficient documentation

## 2019-03-02 DIAGNOSIS — K219 Gastro-esophageal reflux disease without esophagitis: Secondary | ICD-10-CM | POA: Diagnosis not present

## 2019-03-02 DIAGNOSIS — Z96641 Presence of right artificial hip joint: Secondary | ICD-10-CM | POA: Diagnosis not present

## 2019-03-02 DIAGNOSIS — Z9181 History of falling: Secondary | ICD-10-CM | POA: Insufficient documentation

## 2019-03-02 DIAGNOSIS — E43 Unspecified severe protein-calorie malnutrition: Secondary | ICD-10-CM | POA: Diagnosis not present

## 2019-03-02 DIAGNOSIS — I480 Paroxysmal atrial fibrillation: Secondary | ICD-10-CM | POA: Diagnosis not present

## 2019-03-02 DIAGNOSIS — M6281 Muscle weakness (generalized): Secondary | ICD-10-CM | POA: Insufficient documentation

## 2019-03-02 DIAGNOSIS — N183 Chronic kidney disease, stage 3 (moderate): Secondary | ICD-10-CM | POA: Insufficient documentation

## 2019-03-02 DIAGNOSIS — I272 Pulmonary hypertension, unspecified: Secondary | ICD-10-CM | POA: Diagnosis not present

## 2019-03-02 DIAGNOSIS — Z79899 Other long term (current) drug therapy: Secondary | ICD-10-CM | POA: Insufficient documentation

## 2019-03-02 DIAGNOSIS — N179 Acute kidney failure, unspecified: Secondary | ICD-10-CM

## 2019-03-02 DIAGNOSIS — E785 Hyperlipidemia, unspecified: Secondary | ICD-10-CM | POA: Diagnosis not present

## 2019-03-02 DIAGNOSIS — I130 Hypertensive heart and chronic kidney disease with heart failure and stage 1 through stage 4 chronic kidney disease, or unspecified chronic kidney disease: Secondary | ICD-10-CM | POA: Diagnosis not present

## 2019-03-02 DIAGNOSIS — R5381 Other malaise: Secondary | ICD-10-CM | POA: Diagnosis not present

## 2019-03-02 DIAGNOSIS — N390 Urinary tract infection, site not specified: Principal | ICD-10-CM

## 2019-03-02 DIAGNOSIS — R2681 Unsteadiness on feet: Secondary | ICD-10-CM | POA: Insufficient documentation

## 2019-03-02 LAB — COMPREHENSIVE METABOLIC PANEL
ALT: 21 U/L (ref 0–44)
AST: 18 U/L (ref 15–41)
Albumin: 2.8 g/dL — ABNORMAL LOW (ref 3.5–5.0)
Alkaline Phosphatase: 82 U/L (ref 38–126)
Anion gap: 11 (ref 5–15)
BUN: 26 mg/dL — ABNORMAL HIGH (ref 8–23)
CO2: 22 mmol/L (ref 22–32)
Calcium: 8.5 mg/dL — ABNORMAL LOW (ref 8.9–10.3)
Chloride: 110 mmol/L (ref 98–111)
Creatinine, Ser: 1.45 mg/dL — ABNORMAL HIGH (ref 0.61–1.24)
GFR calc Af Amer: 52 mL/min — ABNORMAL LOW (ref >60)
GFR calc non Af Amer: 45 mL/min — ABNORMAL LOW (ref >60)
Glucose, Bld: 104 mg/dL — ABNORMAL HIGH (ref 70–99)
Potassium: 3.7 mmol/L (ref 3.5–5.1)
Sodium: 143 mmol/L (ref 135–145)
Total Bilirubin: 0.3 mg/dL (ref 0.3–1.2)
Total Protein: 6.2 g/dL — ABNORMAL LOW (ref 6.5–8.1)

## 2019-03-02 LAB — CBC
HCT: 33.6 % — ABNORMAL LOW (ref 39.0–52.0)
Hemoglobin: 10.3 g/dL — ABNORMAL LOW (ref 13.0–17.0)
MCH: 29.7 pg (ref 26.0–34.0)
MCHC: 30.7 g/dL (ref 30.0–36.0)
MCV: 96.8 fL (ref 80.0–100.0)
Platelets: 210 10*3/uL (ref 150–400)
RBC: 3.47 MIL/uL — ABNORMAL LOW (ref 4.22–5.81)
RDW: 15.3 % (ref 11.5–15.5)
WBC: 8.7 10*3/uL (ref 4.0–10.5)
nRBC: 0 % (ref 0.0–0.2)

## 2019-03-02 LAB — URINALYSIS, ROUTINE W REFLEX MICROSCOPIC
Bilirubin Urine: NEGATIVE
Glucose, UA: NEGATIVE mg/dL
Ketones, ur: NEGATIVE mg/dL
Nitrite: NEGATIVE
Protein, ur: 30 mg/dL — AB
RBC / HPF: >50 RBC/hpf — ABNORMAL HIGH (ref 0–5)
Specific Gravity, Urine: 1.016 (ref 1.005–1.030)
WBC, UA: >50 WBC/hpf — ABNORMAL HIGH (ref 0–5)
pH: 6 (ref 5.0–8.0)

## 2019-03-02 LAB — LACTIC ACID, PLASMA: Lactic Acid, Venous: 0.9 mmol/L (ref 0.5–1.9)

## 2019-03-02 MED ORDER — ACETAMINOPHEN 325 MG PO TABS
650.0000 mg | ORAL_TABLET | Freq: Four times a day (QID) | ORAL | Status: DC | PRN
  Administered 2019-03-03: 650 mg via ORAL
  Filled 2019-03-02: qty 2

## 2019-03-02 MED ORDER — SODIUM CHLORIDE 0.9 % IV SOLN: 250.0000 mL | INTRAVENOUS | Status: DC | PRN

## 2019-03-02 MED ORDER — APIXABAN 5 MG PO TABS
5.0000 mg | ORAL_TABLET | Freq: Two times a day (BID) | ORAL | Status: DC
  Administered 2019-03-03 – 2019-03-04 (×4): 5 mg via ORAL
  Filled 2019-03-02: qty 1
  Filled 2019-03-02: qty 2
  Filled 2019-03-02 (×2): qty 1

## 2019-03-02 MED ORDER — PRO-STAT SUGAR FREE PO LIQD
30.0000 mL | Freq: Two times a day (BID) | ORAL | Status: DC
  Administered 2019-03-03 – 2019-03-04 (×2): 30 mL via ORAL
  Filled 2019-03-02 (×3): qty 30

## 2019-03-02 MED ORDER — SODIUM CHLORIDE 0.9% FLUSH
3.0000 mL | Freq: Two times a day (BID) | INTRAVENOUS | Status: DC
  Administered 2019-03-03 – 2019-03-04 (×4): 3 mL via INTRAVENOUS

## 2019-03-02 MED ORDER — SODIUM CHLORIDE 0.9 % IV BOLUS
1000.0000 mL | Freq: Once | INTRAVENOUS | Status: AC
  Administered 2019-03-02: 1000 mL via INTRAVENOUS

## 2019-03-02 MED ORDER — FLUCONAZOLE 200 MG PO TABS
200.0000 mg | ORAL_TABLET | Freq: Once | ORAL | Status: AC
  Administered 2019-03-02: 200 mg via ORAL
  Filled 2019-03-02: qty 1

## 2019-03-02 MED ORDER — ENOXAPARIN SODIUM 40 MG/0.4ML ~~LOC~~ SOLN: 40.0000 mg | SUBCUTANEOUS | Status: DC

## 2019-03-02 MED ORDER — ACETAMINOPHEN 650 MG RE SUPP: 650.0000 mg | Freq: Four times a day (QID) | RECTAL | Status: DC | PRN

## 2019-03-02 MED ORDER — SODIUM CHLORIDE 0.9 % IV SOLN
1.0000 g | Freq: Once | INTRAVENOUS | Status: AC
  Administered 2019-03-02: 1 g via INTRAVENOUS
  Filled 2019-03-02: qty 10

## 2019-03-02 MED ORDER — SODIUM CHLORIDE 0.9% FLUSH: 3.0000 mL | INTRAVENOUS | Status: DC | PRN

## 2019-03-02 NOTE — Telephone Encounter (Signed)
Nurse Evelena Peat called and stated this patient was confused and seemed like he has a infection or dehydrated, I informed her that we had gotten a call this morning and the caregiver was told to take the patient to urgent care per the provider because we had no openings today, she understood.  Carl [name],cma

## 2019-03-02 NOTE — ED Provider Notes (Addendum)
[facility] DEPT Provider Note   CSN: [id number] Arrival date & time: 03/02/19  1732    History   Chief Complaint Chief Complaint  Patient presents with  . Weakness  . Altered Mental Status    HPI Carl [name] is a 83 y.o. male.     Patient presents via EMS with general weakness, and periods of confusion. Patient very limited historian, confusion - level 5 caveat. Pt with recent tx for uti, also recent ED visit for fall - ct then neg acute. Patient notes recent poor po intake. Denies headache. No chest pain or discomfort. Denies cough or sob. No abd pain or nvd. Denies gu c/o.   The history is provided by the patient and the EMS personnel. The history is limited by the condition of the patient.    Past Medical History:  Diagnosis Date  . (HFpEF) heart failure with preserved ejection fraction ([place])    a. 08/2016 Echo: EF 50-55%.  [name] AAA (abdominal aortic aneurysm) ([place])    CT abd. dated 06-18-2018 in epic,  3.1cm;  followed by pcp  . Arthritis    arms  . Bladder calculus   . Chronic anemia   . Chronic anxiety   . CKD (chronic kidney disease), stage III ([place])   . Coronary artery disease CARDIOLOGIST-  DR Rockey Situ   a. CABG 2006 with  LIMA-LAD, SVG-Ramus, SVG-OM, seq SVG-PLV-PDA.  . DOE (dyspnea on exertion)   . GERD (gastroesophageal reflux disease)   . Hematuria   . History of bladder stone   . History of non-ST elevation myocardial infarction (NSTEMI) 03/30/2005   post op recovery inguinal hernia repair  . History of recurrent UTIs    last admission 08-04-2018 pseudomonas UTI  . Hyperlipidemia   . Hyperplasia of prostate without lower urinary tract symptoms (LUTS)   . Hypertension   . Ischemic cardiomyopathy    a. echo (01/13) 45-50% septal akinesis, mild MR; b. Echo  (01/16) ef 60-65%, mid-apical anteroseptal hypokinesis; c. echo 09-16-2016 ef 50-55%  . Left ureteral stone   . Lung nodule    noted 08/ 2018;  followed by pcp  .  Moderate mitral regurgitation    a.08/2016 Echo: Mod MR/TR.  [name] Orthostatic hypotension   . PAC (premature atrial contraction)   . Paroxysmal atrial flutter ([place])    a. dx 08/2016, started on amiodarone.  . Persistent atrial fibrillation followed by cardiologist-- dr Rockey Situ   a. dx AFib 08/2016 s/p DCCV 10/2016-->eliquis (CHA2DS2VASc = 5).  . Pulmonary hypertension ([place])    last echo in epic 09-16-2016  . Recurrent prostate cancer ([place]) UROLOGIST-- DR BELL   s/p  radioactive prostate seed implants in 1997;    due to PSA rising pt has been doing lupron injection's few past several yrs at dr bell office  . Renal calculi    bilateral per CT 06-18-2018  . S/P CABG x 5 04/04/2005   LIMA to LAD,  SVG to Ramus,  SVG to OM,  seqSVG to PLV and PDA  . Wears glasses     Patient Active Problem List   Diagnosis Date Noted  . Displaced fracture of right femoral neck ([place]) 10/07/2018  . Hip fracture ([place]) 10/05/2018  . Intracranial hematoma ([place]) 10/05/2018  . AAA (abdominal aortic aneurysm) ([place]) 08/26/2018  . Pseudomonas aeruginosa infection 08/06/2018  . Acute cystitis with hematuria   . Pseudomonas urinary tract infection 08/05/2018  . Hematuria 08/05/2018  . Pressure injury of skin 08/05/2018  .  Kidney stones 06/30/2018  . Allergic rhinitis 04/26/2018  . Skin tear of elbow without complication 10/62/6948  . Complicated UTI (urinary tract infection) 01/05/2018  . Urolithiasis 01/05/2018  . Frequent urination 01/01/2018  . Colon polyps 09/15/2017  . GERD (gastroesophageal reflux disease) 09/15/2017  . Wrist pain, acute, right 05/25/2017  . Low back pain 03/24/2017  . Lung nodule seen on imaging study: 7 mm nodule left midlung 03/13/2017  . Physical deconditioning 03/13/2017  . Normocytic anemia 03/13/2017  . Fall 03/12/2017  . Abdominal soreness 03/12/2017  . Generalized weakness 03/12/2017  . Leukocytosis 03/12/2017  . Hyponatremia 03/12/2017  . Orthostatic hypotension 02/16/2017   . Blood in stool 09/19/2016  . Anticoagulated 09/19/2016  . History of prostate cancer 09/04/2016  . Persistent atrial fibrillation 09/04/2016  . Acute congestive heart failure ([place]) 09/04/2016  . Cardiomyopathy, ischemic 07/16/2012  . Chronic kidney disease (CKD), stage III (moderate) ([place]) 07/16/2012  . Essential hypertension 08/05/2011  . Murmur 08/05/2011  . 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 Dyslipidemia 02/04/2011    Past Surgical History:  Procedure Laterality Date  . CARDIAC CATHETERIZATION  04/02/2005   dr Doreatha Lew    Critical three-vessel coronary disease --  Essentially normal left ventricular function , ef 55%  . CARDIOVASCULAR STRESS TEST  05-01-2008  dr Doreatha Lew /  dr harding   Low risk nuclear study w/ no ischemia or scar but flattened septal motion/  ef 64%  . CARDIOVERSION N/A 11/19/2016   Procedure: CARDIOVERSION;  Surgeon: Jerline Pain, MD;  Location: [facility];  Service: Cardiovascular;  Laterality: N/A;  . COLONOSCOPY N/A 10/23/2016   Procedure: COLONOSCOPY;  Surgeon: Milus Banister, MD;  Location: [facility];  Service: Endoscopy;  Laterality: N/A;  . CORONARY ARTERY BYPASS GRAFT  04/04/2005   dr  Ricard Dillon   LIMA - LAD,  SVG - Ramus, SVG - OM,  seqSVG - PLV and PDA  . CYSTOSCOPY WITH LITHOLAPAXY N/A 05/07/2016   Procedure: CYSTOSCOPY WITH LITHOLAPAXY;  Surgeon: Rana Snare, MD;  Location[facility];  Service: Urology;  Laterality: N/A;  . CYSTOSCOPY WITH RETROGRADE PYELOGRAM, URETEROSCOPY AND STENT PLACEMENT  09/03/2018   Procedure: CYSTOSCOPY WITH RETROGRADE PYELOGRAM, URETEROSCOPY LASER LITHOTRIPSY AND STENT PLACEMENT;  Surgeon: Lucas Mallow, MD;  Location: [facility];  Service: Urology;;  . HOLMIUM LASER APPLICATION N/A 54/62/7035   Procedure: HOLMIUM LASER APPLICATION;  Surgeon: Rana Snare, MD;  Location: [facility];  Service: Urology;  Laterality: N/A;  . INGUINAL HERNIA REPAIR Left 03-30-2005  dr Excell Seltzer   incarcerated    . RADIOACTIVE PROSTATE SEED IMPLANTS  1997  . TOTAL HIP ARTHROPLASTY Right 10/07/2018   Procedure: TOTAL HIP ARTHROPLASTY ANTERIOR APPROACH;  Surgeon: Rod Can, MD;  Location: [facility];  Service: Orthopedics;  Laterality: Right;  . TRANSTHORACIC ECHOCARDIOGRAM  08/24/2014   mid anteroseptal and distal septak hypokinetic,  mild focal basal LVH, ef 60-65%,  grade 1 diastolic dysfunction/  mild MR/  mild LAE/  mild dilated RV with normal RVSP/  trivial TR        Home Medications    Prior to Admission medications   Medication Sig Start Date End Date Taking? Authorizing Provider  acetaminophen (TYLENOL) 500 MG tablet Take 1,000 mg by mouth every 6 (six) hours as needed for mild pain (for pain).    Yes [provider]  amiodarone (PACERONE) 200 MG tablet Take 1 tablet (200 mg total) by mouth daily. 07/12/18  Yes [name]  M, PA-C  apixaban (ELIQUIS) 5 MG TABS tablet Take 1 tablet (5 mg total) by mouth 2 (two) times daily. 12/16/18  Yes [name], NP  omeprazole (PRILOSEC) 40 MG capsule Take 1 capsule (40 mg total) by mouth daily. 02/02/19  Yes [name], MD  rosuvastatin (CRESTOR) 40 MG tablet Take 1 tablet (40 mg total) by mouth daily. 06/18/18  Yes [name], [name], MD  tamsulosin (FLOMAX) 0.4 MG CAPS capsule Take 1 capsule (0.4 mg total) by mouth daily after supper. 01/06/18  Yes [name], MD  senna-docusate (SENOKOT-S) 8.6-50 MG tablet Take 1 tablet by mouth 2 (two) times daily. Patient taking differently: Take 1 tablet by mouth 2 (two) times daily as needed for mild constipation.  08/09/18   Eugenie Filler, MD    Family History Family History  Problem Relation Age of Onset  . Heart disease Mother   . Cancer Mother   . Cancer Father   . Cancer Brother     Social History Social History   Tobacco Use  . Smoking status: Former Smoker    Years: 2.00    Types: Cigarettes    Quit date: 07/28/1960    Years since quitting: 58.6  .  Smokeless tobacco: Never Used  Substance Use Topics  . Alcohol use: No  . Drug use: No     Allergies   Ciprofloxacin   Review of Systems Review of Systems  Constitutional: Negative for fever.  HENT: Negative for sore throat.   Eyes: Negative for visual disturbance.  Respiratory: Negative for cough and shortness of breath.   Cardiovascular: Negative for chest pain.  Gastrointestinal: Negative for abdominal pain and vomiting.  Endocrine: Negative for polyuria.  Genitourinary: Negative for dysuria and flank pain.  Musculoskeletal: Negative for back pain and neck pain.  Skin: Negative for rash.  Neurological: Negative for headaches.  Hematological: Does not bruise/bleed easily.  Psychiatric/Behavioral: Positive for confusion.     Physical Exam Updated Vital Signs BP (!) 143/82   Pulse 90   Temp 97.8 F (36.6 C) (Oral)   Resp 14   SpO2 99%   Physical Exam Vitals signs and nursing note reviewed.  Constitutional:      Appearance: Normal appearance. He is well-developed.  HENT:     Head: Atraumatic.     Nose: Nose normal.     Mouth/Throat:     Mouth: Mucous membranes are moist.     Pharynx: Oropharynx is clear.  Eyes:     General: No scleral icterus.    Conjunctiva/sclera: Conjunctivae normal.     Pupils: Pupils are equal, round, and reactive to light.  Neck:     Musculoskeletal: Normal range of motion and neck supple. No neck rigidity.     Trachea: No tracheal deviation.     Comments: No stiffness or rigidity.  Cardiovascular:     Rate and Rhythm: Normal rate and regular rhythm.     Pulses: Normal pulses.     Heart sounds: Normal heart sounds. No murmur. No friction rub. No gallop.   Pulmonary:     Effort: Pulmonary effort is normal. No accessory muscle usage or respiratory distress.     Breath sounds: Normal breath sounds.  Abdominal:     General: Bowel sounds are normal. There is no distension.     Palpations: Abdomen is soft.     Tenderness: There is no  abdominal tenderness. There is no guarding.  Genitourinary:    Comments: No cva tenderness. Musculoskeletal:  General: No swelling or tenderness.     Comments: Mild tenderness right hip, otherwise, good rom bil ext without pain or focal bony tenderness. CTLS spine, non tender, aligned, no step off.   Skin:    General: Skin is warm and dry.     Findings: No rash.  Neurological:     Mental Status: He is alert.     Comments: Alert, speech clear. Oriented to person/place,  Motor grossly intact bil, stre 5/5. sens grossly intact.   Psychiatric:        Mood and Affect: Mood normal.      ED Treatments / Results  Labs (all labs ordered are listed, but only abnormal results are displayed) Results for orders placed or performed during the hospital encounter of 03/02/19  CBC  Result Value Ref Range   WBC 8.7 4.0 - 10.5 K/uL   RBC 3.47 (L) 4.22 - 5.81 MIL/uL   Hemoglobin 10.3 (L) 13.0 - 17.0 g/dL   HCT 33.6 (L) 39.0 - 52.0 %   MCV 96.8 80.0 - 100.0 fL   MCH 29.7 26.0 - 34.0 pg   MCHC 30.7 30.0 - 36.0 g/dL   RDW 15.3 11.5 - 15.5 %   Platelets 210 150 - 400 K/uL   nRBC 0.0 0.0 - 0.2 %  Comprehensive metabolic panel  Result Value Ref Range   Sodium 143 135 - 145 mmol/L   Potassium 3.7 3.5 - 5.1 mmol/L   Chloride 110 98 - 111 mmol/L   CO2 22 22 - 32 mmol/L   Glucose, Bld 104 (H) 70 - 99 mg/dL   BUN 26 (H) 8 - 23 mg/dL   Creatinine, Ser 1.45 (H) 0.61 - 1.24 mg/dL   Calcium 8.5 (L) 8.9 - 10.3 mg/dL   Total Protein 6.2 (L) 6.5 - 8.1 g/dL   Albumin 2.8 (L) 3.5 - 5.0 g/dL   AST 18 15 - 41 U/L   ALT 21 0 - 44 U/L   Alkaline Phosphatase 82 38 - 126 U/L   Total Bilirubin 0.3 0.3 - 1.2 mg/dL   GFR calc non Af Amer 45 (L) >60 mL/min   GFR calc Af Amer 52 (L) >60 mL/min   Anion gap 11 5 - 15  Lactic acid, plasma  Result Value Ref Range   Lactic Acid, Venous 0.9 0.5 - 1.9 mmol/L  Urinalysis, Routine w reflex microscopic  Result Value Ref Range   Color, Urine YELLOW YELLOW    APPearance HAZY (A) CLEAR   Specific Gravity, Urine 1.016 1.005 - 1.030   pH 6.0 5.0 - 8.0   Glucose, UA NEGATIVE NEGATIVE mg/dL   Hgb urine dipstick LARGE (A) NEGATIVE   Bilirubin Urine NEGATIVE NEGATIVE   Ketones, ur NEGATIVE NEGATIVE mg/dL   Protein, ur 30 (A) NEGATIVE mg/dL   Nitrite NEGATIVE NEGATIVE   Leukocytes,Ua LARGE (A) NEGATIVE   RBC / HPF >50 (H) 0 - 5 RBC/hpf   WBC, UA >50 (H) 0 - 5 WBC/hpf   Bacteria, UA RARE (A) NONE SEEN   Squamous Epithelial / LPF 0-5 0 - 5   Mucus PRESENT    Budding Yeast PRESENT    Dg Chest 2 View  Result Date: 02/25/2019 CLINICAL DATA:  Cough EXAM: CHEST - 2 VIEW COMPARISON:  10/05/2018 FINDINGS: Postsurgical changes to the sternum and mediastinum. Heart size is normal. Calcific aortic knob. No focal airspace consolidation, pleural effusion, or pneumothorax. Interval removal of a right-sided PICC line. IMPRESSION: No active cardiopulmonary disease. Electronically Signed  By: Davina Poke M.D.   On: 02/25/2019 15:58   Dg Shoulder Right  Result Date: 02/25/2019 CLINICAL DATA:  Status post fall x3 in the past 24 hours. Right shoulder pain. Initial encounter. EXAM: RIGHT SHOULDER - 2+ VIEW COMPARISON:  None. FINDINGS: There is no acute bony or joint abnormality. Acromioclavicular osteoarthritis is noted. Soft tissues unremarkable. IMPRESSION: No acute abnormality. Acromioclavicular osteoarthritis. Electronically Signed   By: Inge Rise M.D.   On: 02/25/2019 14:45   Ct Head Wo Contrast  Result Date: 02/25/2019 CLINICAL DATA:  Altered level of consciousness.  Fall. EXAM: CT HEAD WITHOUT CONTRAST TECHNIQUE: Contiguous axial images were obtained from the base of the skull through the vertex without intravenous contrast. COMPARISON:  CT head 10/26/2018 FINDINGS: Brain: Mild atrophy. Hypodensity in the white matter bilaterally consistent with chronic microvascular ischemia unchanged. No acute infarct or hemorrhage 10 x 20 mm extra-axial mass left  frontal region unchanged. Slight calcification. This is most consistent with meningioma. No midline shift. Vascular: Negative for hyperdense vessel Skull: Negative Sinuses/Orbits: Mild mucosal edema paranasal sinuses. Negative orbit Other: None IMPRESSION: Atrophy and chronic microvascular ischemia. No acute infarct or hemorrhage 10 x 20 mm extra-axial mass left frontal region unchanged most likely meningioma. Electronically Signed   By: Franchot Gallo M.D.   On: 02/25/2019 15:48   Dg Hip Unilat W Or W/o Pelvis 2-3 Views Right  Result Date: 03/02/2019 CLINICAL DATA:  Right hip pain secondary to a fall last week. Right total hip arthroplasty in the March 2020 EXAM: DG HIP (WITH OR WITHOUT PELVIS) 2-3V RIGHT COMPARISON:  Radiograph dated 10/07/2018 FINDINGS: There is no fracture or dislocation. The components of the hip prosthesis appear in good position. Multiple radioactive seeds in the prostate are noted. IMPRESSION: 1. No acute abnormalities. 2. Hardware intact. Electronically Signed   By: Lorriane Shire M.D.   On: 03/02/2019 20:55    EKG EKG Interpretation  Date/Time:  Wednesday March 02 2019 18:14:05 EDT Ventricular Rate:  70 PR Interval:    QRS Duration: 104 QT Interval:  482 QTC Calculation: 521 R Axis:   6 Text Interpretation:  Sinus or ectopic atrial rhythm with prolonged AV conduction Prolonged QT interval Nonspecific T wave abnormality Confirmed by Lajean Saver [phone]) on 03/02/2019 6:22:18 PM   Radiology Dg Hip Unilat W Or W/o Pelvis 2-3 Views Right  Result Date: 03/02/2019 CLINICAL DATA:  Right hip pain secondary to a fall last week. Right total hip arthroplasty in the March 2020 EXAM: DG HIP (WITH OR WITHOUT PELVIS) 2-3V RIGHT COMPARISON:  Radiograph dated 10/07/2018 FINDINGS: There is no fracture or dislocation. The components of the hip prosthesis appear in good position. Multiple radioactive seeds in the prostate are noted. IMPRESSION: 1. No acute abnormalities. 2. Hardware  intact. Electronically Signed   By: Lorriane Shire M.D.   On: 03/02/2019 20:55    Procedures Procedures (including critical care time)  Medications Ordered in ED Medications  sodium chloride 0.9 % bolus 1,000 mL (0 mLs Intravenous Stopped 03/02/19 2057)  sodium chloride 0.9 % bolus 1,000 mL (1,000 mLs Intravenous New Bag/Given 03/02/19 2109)     Initial Impression / Assessment and Plan / ED Course  I have reviewed the triage vital signs and the nursing notes.  Pertinent labs & imaging results that were available during my care of the patient were reviewed by me and considered in my medical decision making (see chart for details).  Iv ns. Stat labs. Ecg. Xrays.  Reviewed nursing notes and  prior charts for additional history. Prior head ct post last fall, neg acute then. Pt denies any headache.   Labs reviewed by me - lactate normal. Lytes normal . UA pos for uti.   Xrays reviewed by me - no fx.   Discussed pt with daughter. She indicates he completed his rx for uti a couple days ago. States that today, the general weakness/confusion is new/different from baseline. Denies hx dementia.   Will tx for uti. Rocephin iv. Fluconazole po. Given ivf as current and last chem reflect aki.   Hospitalists consulted for admission.   Final Clinical Impressions(s) / ED Diagnoses   Final diagnoses:  None    ED Discharge Orders    None         Lajean Saver, MD 03/02/19 2143

## 2019-03-02 NOTE — ED Triage Notes (Signed)
Patient is from home and transported via [facility]. Per EMS, patient is experiencing altered mental status and had a fall last week where he hit his head. Also, taking blood thinners. EMS states he was under care of a home health nurse who was administering IV antibiotics at home for recent UTI up until today. Apparently, patient is usually alert, oriented x 4 but is alert, oriented to person, place, and situation. Disoriented to time.

## 2019-03-02 NOTE — H&P (Addendum)
TRH H&P    Patient Demographics:    Carl [name], is a 83 y.o. male  MRN: [id number]  DOB - May 28, 1936  Admit Date - 03/02/2019  Referring MD/NP/PA:  Alphonzo Lemmings  Outpatient Primary MD for the patient is [name] [name], MD  Patient coming from:  home  Chief complaint- ams   HPI:    Carl [name]  is a 83 y.o. male,  With hypertension, hyperlipidemia, CKD stage3, AAA,  CAD s/p CABG, , Pafib CHF, moderate MR, pulmonary hypertension, h/o prostate cancer apparently presents with AMS, pt notes slight dysuria.  Denies fever, chills, n/v, flank pain, cough, cp, palp, sob, abd pain, diarrhea.   In ED,  CT brain -> no acute intracranial abnormality  Wbc 8.7, Hgb 10.3, Plt 210 Na 143, K 3.7,  Bun 26, Creatinine 1.45 Alb 2.8 Ast 18, Alt 21, Alk phos 82, T. Bili 0.3  Lactic acid 0.9  Urinalysis wbc >50,. Rbc >50 Urine culture pending  Pt will be admitted for AMS secondary to UTI,             Review of systems:    In addition to the HPI above,  No Fever-chills, No Headache, No changes with Vision or hearing, No problems swallowing food or Liquids, No Chest pain, Cough or Shortness of Breath, No Abdominal pain, No Nausea or Vomiting, bowel movements are regular, No Blood in stool or Urine,  No new skin rashes or bruises, No new joints pains-aches,  No new weakness, tingling, numbness in any extremity, No recent weight gain or loss, No polyuria, polydypsia or polyphagia, No significant Mental Stressors.  All other systems reviewed and are negative.    Past History of the following :    Past Medical History:  Diagnosis Date  . (HFpEF) heart failure with preserved ejection fraction ([place])    a. 08/2016 Echo: EF 50-55%.  [name] AAA (abdominal aortic aneurysm) ([place])    CT abd. dated 06-18-2018 in epic,  3.1cm;  followed by pcp  . Arthritis    arms  . Bladder calculus   . Chronic  anemia   . Chronic anxiety   . CKD (chronic kidney disease), stage III ([place])   . Coronary artery disease CARDIOLOGIST-  DR Rockey Situ   a. CABG 2006 with  LIMA-LAD, SVG-Ramus, SVG-OM, seq SVG-PLV-PDA.  . DOE (dyspnea on exertion)   . GERD (gastroesophageal reflux disease)   . Hematuria   . History of bladder stone   . History of non-ST elevation myocardial infarction (NSTEMI) 03/30/2005   post op recovery inguinal hernia repair  . History of recurrent UTIs    last admission 08-04-2018 pseudomonas UTI  . Hyperlipidemia   . Hyperplasia of prostate without lower urinary tract symptoms (LUTS)   . Hypertension   . Ischemic cardiomyopathy    a. echo (01/13) 45-50% septal akinesis, mild MR; b. Echo  (01/16) ef 60-65%, mid-apical anteroseptal hypokinesis; c. echo 09-16-2016 ef 50-55%  . Left ureteral stone   . Lung nodule    noted 08/ 2018;  followed by  pcp  . Moderate mitral regurgitation    a.08/2016 Echo: Mod MR/TR.  [name] Orthostatic hypotension   . PAC (premature atrial contraction)   . Paroxysmal atrial flutter ([place])    a. dx 08/2016, started on amiodarone.  . Persistent atrial fibrillation followed by cardiologist-- dr Rockey Situ   a. dx AFib 08/2016 s/p DCCV 10/2016-->eliquis (CHA2DS2VASc = 5).  . Pulmonary hypertension ([place])    last echo in epic 09-16-2016  . Recurrent prostate cancer ([place]) UROLOGIST-- DR BELL   s/p  radioactive prostate seed implants in 1997;    due to PSA rising pt has been doing lupron injection's few past several yrs at dr bell office  . Renal calculi    bilateral per CT 06-18-2018  . S/P CABG x 5 04/04/2005   LIMA to LAD,  SVG to Ramus,  SVG to OM,  seqSVG to PLV and PDA  . Wears glasses       Past Surgical History:  Procedure Laterality Date  . CARDIAC CATHETERIZATION  04/02/2005   dr Doreatha Lew    Critical three-vessel coronary disease --  Essentially normal left ventricular function , ef 55%  . CARDIOVASCULAR STRESS TEST  05-01-2008  dr Doreatha Lew /  dr harding    Low risk nuclear study w/ no ischemia or scar but flattened septal motion/  ef 64%  . CARDIOVERSION N/A 11/19/2016   Procedure: CARDIOVERSION;  Surgeon: Jerline Pain, MD;  Location: [facility];  Service: Cardiovascular;  Laterality: N/A;  . COLONOSCOPY N/A 10/23/2016   Procedure: COLONOSCOPY;  Surgeon: Milus Banister, MD;  Location: [facility];  Service: Endoscopy;  Laterality: N/A;  . CORONARY ARTERY BYPASS GRAFT  04/04/2005   dr  Ricard Dillon   LIMA - LAD,  SVG - Ramus, SVG - OM,  seqSVG - PLV and PDA  . CYSTOSCOPY WITH LITHOLAPAXY N/A 05/07/2016   Procedure: CYSTOSCOPY WITH LITHOLAPAXY;  Surgeon: Rana Snare, MD;  Location: [facility];  Service: Urology;  Laterality: N/A;  . CYSTOSCOPY WITH RETROGRADE PYELOGRAM, URETEROSCOPY AND STENT PLACEMENT  09/03/2018   Procedure: CYSTOSCOPY WITH RETROGRADE PYELOGRAM, URETEROSCOPY LASER LITHOTRIPSY AND STENT PLACEMENT;  Surgeon: Lucas Mallow, MD;  Location: [facility];  Service: Urology;;  . HOLMIUM LASER APPLICATION N/A 01/60/1093   Procedure: HOLMIUM LASER APPLICATION;  Surgeon: Rana Snare, MD;  Location: [facility];  Service: Urology;  Laterality: N/A;  . INGUINAL HERNIA REPAIR Left 03-30-2005  dr Excell Seltzer   incarcerated   . RADIOACTIVE PROSTATE SEED IMPLANTS  1997  . TOTAL HIP ARTHROPLASTY Right 10/07/2018   Procedure: TOTAL HIP ARTHROPLASTY ANTERIOR APPROACH;  Surgeon: Rod Can, MD;  Location: [facility];  Service: Orthopedics;  Laterality: Right;  . TRANSTHORACIC ECHOCARDIOGRAM  08/24/2014   mid anteroseptal and distal septak hypokinetic,  mild focal basal LVH, ef 60-65%,  grade 1 diastolic dysfunction/  mild MR/  mild LAE/  mild dilated RV with normal RVSP/  trivial TR      Social History:      Social History   Tobacco Use  . Smoking status: Former Smoker    Years: 2.00    Types: Cigarettes    Quit date: 07/28/1960    Years since quitting: 58.6  . Smokeless tobacco: Never Used  Substance Use Topics   . Alcohol use: No       Family History :     Family History  Problem Relation Age of Onset  . Heart disease Mother   . Cancer Mother   .  Cancer Father   . Cancer Brother       Home Medications:   Prior to Admission medications   Medication Sig Start Date End Date Taking? Authorizing Provider  acetaminophen (TYLENOL) 500 MG tablet Take 1,000 mg by mouth every 6 (six) hours as needed for mild pain (for pain).    Yes [provider]  amiodarone (PACERONE) 200 MG tablet Take 1 tablet (200 mg total) by mouth daily. 07/12/18  Yes Dunn, Areta Haber, PA-C  apixaban (ELIQUIS) 5 MG TABS tablet Take 1 tablet (5 mg total) by mouth 2 (two) times daily. 12/16/18  Yes [name], NP  omeprazole (PRILOSEC) 40 MG capsule Take 1 capsule (40 mg total) by mouth daily. 02/02/19  Yes [name], MD  rosuvastatin (CRESTOR) 40 MG tablet Take 1 tablet (40 mg total) by mouth daily. 06/18/18  Yes [name], [name], MD  tamsulosin (FLOMAX) 0.4 MG CAPS capsule Take 1 capsule (0.4 mg total) by mouth daily after supper. 01/06/18  Yes [name], MD  senna-docusate (SENOKOT-S) 8.6-50 MG tablet Take 1 tablet by mouth 2 (two) times daily. Patient taking differently: Take 1 tablet by mouth 2 (two) times daily as needed for mild constipation.  08/09/18   Eugenie Filler, MD     Allergies:     Allergies  Allergen Reactions  . Ciprofloxacin Other (See Comments)    PER HEART DR     Physical Exam:   Vitals  Blood pressure (!) 143/82, pulse 90, temperature 97.8 F (36.6 C), temperature source Oral, resp. rate 14, SpO2 99 %.  1.  General: axoxo3 (person, place (hospital), year)  2. Psychiatric: euthymic  3. Neurologic: cn2-12 intact, reflexes 2+ symmetric, diffuse with no clonus, motor  5/5 in all 4 ext  4. HEENMT:  Anicteric, pupils 1.29m symmetric, direct, consensual, near intact Neck: no jvd, supple  5. Respiratory : CTAB  6. Cardiovascular : rrr s1, s2, no  m/g/r  7. Gastrointestinal:  Abd: soft, flat, nt, nd, +bs  8. Skin:  Ext: no c/c/e, no rash  9.Musculoskeletal:  Good ROM,   No adenopathy    Data Review:    CBC Recent Labs  Lab 02/25/19 1518 03/02/19 1840  WBC 6.8 8.7  HGB 10.6* 10.3*  HCT 35.3* 33.6*  PLT 224 210  MCV 97.0 96.8  MCH 29.1 29.7  MCHC 30.0 30.7  RDW 15.1 15.3  LYMPHSABS 0.9  --   MONOABS 0.5  --   EOSABS 0.0  --   BASOSABS 0.0  --    ------------------------------------------------------------------------------------------------------------------  Results for orders placed or performed during the hospital encounter of 03/02/19 (from the past 48 hour(s))  CBC     Status: Abnormal   Collection Time: 03/02/19  6:40 PM  Result Value Ref Range   WBC 8.7 4.0 - 10.5 K/uL   RBC 3.47 (L) 4.22 - 5.81 MIL/uL   Hemoglobin 10.3 (L) 13.0 - 17.0 g/dL   HCT 33.6 (L) 39.0 - 52.0 %   MCV 96.8 80.0 - 100.0 fL   MCH 29.7 26.0 - 34.0 pg   MCHC 30.7 30.0 - 36.0 g/dL   RDW 15.3 11.5 - 15.5 %   Platelets 210 150 - 400 K/uL   nRBC 0.0 0.0 - 0.2 %    Comment: Performed [facility] [address], [place] [address] Comprehensive metabolic panel     Status: Abnormal   Collection Time: 03/02/19  6:40 PM  Result Value Ref  Range   Sodium 143 135 - 145 mmol/L   Potassium 3.7 3.5 - 5.1 mmol/L   Chloride 110 98 - 111 mmol/L   CO2 22 22 - 32 mmol/L   Glucose, Bld 104 (H) 70 - 99 mg/dL   BUN 26 (H) 8 - 23 mg/dL   Creatinine, Ser 1.45 (H) 0.61 - 1.24 mg/dL   Calcium 8.5 (L) 8.9 - 10.3 mg/dL   Total Protein 6.2 (L) 6.5 - 8.1 g/dL   Albumin 2.8 (L) 3.5 - 5.0 g/dL   AST 18 15 - 41 U/L   ALT 21 0 - 44 U/L   Alkaline Phosphatase 82 38 - 126 U/L   Total Bilirubin 0.3 0.3 - 1.2 mg/dL   GFR calc non Af Amer 45 (L) >60 mL/min   GFR calc Af Amer 52 (L) >60 mL/min   Anion gap 11 5 - 15    Comment: Performed at [facility], [place] [address], [place], [place] [address]  Lactic  acid, plasma     Status: None   Collection Time: 03/02/19  6:40 PM  Result Value Ref Range   Lactic Acid, Venous 0.9 0.5 - 1.9 mmol/L    Comment: Performed at [facility], [place] [address], [place], [place] [address]  Urinalysis, Routine w reflex microscopic     Status: Abnormal   Collection Time: 03/02/19  8:34 PM  Result Value Ref Range   Color, Urine YELLOW YELLOW   APPearance HAZY (A) CLEAR   Specific Gravity, Urine 1.016 1.005 - 1.030   pH 6.0 5.0 - 8.0   Glucose, UA NEGATIVE NEGATIVE mg/dL   Hgb urine dipstick LARGE (A) NEGATIVE   Bilirubin Urine NEGATIVE NEGATIVE   Ketones, ur NEGATIVE NEGATIVE mg/dL   Protein, ur 30 (A) NEGATIVE mg/dL   Nitrite NEGATIVE NEGATIVE   Leukocytes,Ua LARGE (A) NEGATIVE   RBC / HPF >50 (H) 0 - 5 RBC/hpf   WBC, UA >50 (H) 0 - 5 WBC/hpf   Bacteria, UA RARE (A) NONE SEEN   Squamous Epithelial / LPF 0-5 0 - 5   Mucus PRESENT    Budding Yeast PRESENT     Comment: Performed at [facility], [address] [place], [place] [address]    Chemistries  Recent Labs  Lab 02/25/19 1518 03/02/19 1840  NA 143 143  K 4.0 3.7  CL 111 110  CO2 26 22  GLUCOSE 107* 104*  BUN 29* 26*  CREATININE 1.54* 1.45*  CALCIUM 8.7* 8.5*  AST 31 18  ALT 26 21  ALKPHOS 79 82  BILITOT 0.5 0.3   ------------------------------------------------------------------------------------------------------------------  ------------------------------------------------------------------------------------------------------------------ GFR: CrCl cannot be calculated (Unknown ideal weight.). Liver Function Tests: Recent Labs  Lab 02/25/19 1518 03/02/19 1840  AST 31 18  ALT 26 21  ALKPHOS 79 82  BILITOT 0.5 0.3  PROT 6.5 6.2*  ALBUMIN 2.9* 2.8*   No results for input(s): LIPASE, AMYLASE in the last 168 hours. No results for input(s): AMMONIA in the last 168 hours. Coagulation Profile: No results for input(s): INR, PROTIME in the  last 168 hours. Cardiac Enzymes: No results for input(s): CKTOTAL, CKMB, CKMBINDEX, TROPONINI in the last 168 hours. BNP (last 3 results) No results for input(s): PROBNP in the last 8760 hours. HbA1C: No results for input(s): HGBA1C in the last 72 hours. CBG: No results for input(s): GLUCAP in the last 168 hours. Lipid Profile: No results for input(s): CHOL, HDL, LDLCALC, TRIG, CHOLHDL, LDLDIRECT in the last  72 hours. Thyroid Function Tests: No results for input(s): TSH, T4TOTAL, FREET4, T3FREE, THYROIDAB in the last 72 hours. Anemia Panel: No results for input(s): VITAMINB12, FOLATE, FERRITIN, TIBC, IRON, RETICCTPCT in the last 72 hours.  --------------------------------------------------------------------------------------------------------------- Urine analysis:    Component Value Date/Time   COLORURINE YELLOW 03/02/2019 2034   APPEARANCEUR HAZY (A) 03/02/2019 2034   LABSPEC 1.016 03/02/2019 2034   PHURINE 6.0 03/02/2019 2034   GLUCOSEU NEGATIVE 03/02/2019 2034   HGBUR LARGE (A) 03/02/2019 2034   BILIRUBINUR NEGATIVE 03/02/2019 2034   BILIRUBINUR small 01/11/2018 [place] 03/02/2019 2034   PROTEINUR 30 (A) 03/02/2019 2034   UROBILINOGEN 0.2 01/11/2018 1225   NITRITE NEGATIVE 03/02/2019 2034   LEUKOCYTESUR LARGE (A) 03/02/2019 2034      Imaging Results:    Dg Hip Unilat W Or W/o Pelvis 2-3 Views Right  Result Date: 03/02/2019 CLINICAL DATA:  Right hip pain secondary to a fall last week. Right total hip arthroplasty in the March 2020 EXAM: DG HIP (WITH OR WITHOUT PELVIS) 2-3V RIGHT COMPARISON:  Radiograph dated 10/07/2018 FINDINGS: There is no fracture or dislocation. The components of the hip prosthesis appear in good position. Multiple radioactive seeds in the prostate are noted. IMPRESSION: 1. No acute abnormalities. 2. Hardware intact. Electronically Signed   By: Lorriane Shire M.D.   On: 03/02/2019 20:55    nsr at 84, ? 1st degree avb, no st-t changes  c/w ischemia   Assessment & Plan:    Principal Problem:   Acute lower UTI Active Problems:   Dyslipidemia   Essential hypertension   Persistent atrial fibrillation  AMS secondary to UTI Seems improving  Cont Rocephin 1gm iv qday  Acute lower uti Urine culture pending Cont Rocephin iv as above  Pafib Cont Amiodarone 229m po qday Cont Eliquis pharmacy to dose  Hyperlipidemia Cont Crestor 485mpo qhs  Gerd Cont PPI  Bph Cont Flomax 0437mo qhs  Severe protein calorie malnutrition Start Prostat 11m78m bid   DVT Prophylaxis-   Eliquis  AM Labs Ordered, also please review Full Orders  Family Communication: Admission, patients condition and plan of care including tests being ordered have been discussed with the patient  who indicate understanding and agree with the plan and Code Status.  Code Status:  FULL CODE ,  ED states that they [facility] to [facility]   Observation: Based on patients clinical presentation and evaluation of above clinical data, I have made determination that patient meets Observation criteria at this time.  Time spent in minutes : 70     JameJani Gravel on 03/02/2019 at 10:02 PM

## 2019-03-02 NOTE — ED Notes (Addendum)
Call received from pt daughter Ashaad Gaertner [phone] requesting pt status updates when possible. [employer]

## 2019-03-02 NOTE — Progress Notes (Signed)
ANTICOAGULATION CONSULT NOTE - Initial Consult  Pharmacy Consult for apixaban Indication: atrial fibrillation  Allergies  Allergen Reactions  . Ciprofloxacin Other (See Comments)    PER HEART DR    Patient Measurements:     Vital Signs: Temp: 97.8 F (36.6 C) (08/05 1814) Temp Source: Oral (08/05 1814) BP: 160/86 (08/05 2205) Pulse Rate: 79 (08/05 2205)  Labs: Recent Labs    03/02/19 1840  HGB 10.3*  HCT 33.6*  PLT 210  CREATININE 1.45*    CrCl cannot be calculated (Unknown ideal weight.).   Medical History: Past Medical History:  Diagnosis Date  . (HFpEF) heart failure with preserved ejection fraction ([place])    a. 08/2016 Echo: EF 50-55%.  [name] AAA (abdominal aortic aneurysm) ([place])    CT abd. dated 06-18-2018 in epic,  3.1cm;  followed by pcp  . Arthritis    arms  . Bladder calculus   . Chronic anemia   . Chronic anxiety   . CKD (chronic kidney disease), stage III ([place])   . Coronary artery disease CARDIOLOGIST-  DR Rockey Situ   a. CABG 2006 with  LIMA-LAD, SVG-Ramus, SVG-OM, seq SVG-PLV-PDA.  . DOE (dyspnea on exertion)   . GERD (gastroesophageal reflux disease)   . Hematuria   . History of bladder stone   . History of non-ST elevation myocardial infarction (NSTEMI) 03/30/2005   post op recovery inguinal hernia repair  . History of recurrent UTIs    last admission 08-04-2018 pseudomonas UTI  . Hyperlipidemia   . Hyperplasia of prostate without lower urinary tract symptoms (LUTS)   . Hypertension   . Ischemic cardiomyopathy    a. echo (01/13) 45-50% septal akinesis, mild MR; b. Echo  (01/16) ef 60-65%, mid-apical anteroseptal hypokinesis; c. echo 09-16-2016 ef 50-55%  . Left ureteral stone   . Lung nodule    noted 08/ 2018;  followed by pcp  . Moderate mitral regurgitation    a.08/2016 Echo: Mod MR/TR.  [name] Orthostatic hypotension   . PAC (premature atrial contraction)   . Paroxysmal atrial flutter ([place])    a. dx 08/2016, started on amiodarone.  .  Persistent atrial fibrillation followed by cardiologist-- dr Rockey Situ   a. dx AFib 08/2016 s/p DCCV 10/2016-->eliquis (CHA2DS2VASc = 5).  . Pulmonary hypertension ([place])    last echo in epic 09-16-2016  . Recurrent prostate cancer ([place]) UROLOGIST-- DR BELL   s/p  radioactive prostate seed implants in 1997;    due to PSA rising pt has been doing lupron injection's few past several yrs at dr bell office  . Renal calculi    bilateral per CT 06-18-2018  . S/P CABG x 5 04/04/2005   LIMA to LAD,  SVG to Ramus,  SVG to OM,  seqSVG to PLV and PDA  . Wears glasses     Medications:  Scheduled:  . feeding supplement (PRO-STAT SUGAR FREE 64)  30 mL Oral BID  . sodium chloride flush  3 mL Intravenous Q12H   Infusions:  . sodium chloride    . cefTRIAXone (ROCEPHIN)  IV 1 g (03/02/19 2213)    Assessment: 83 yoM on apixaban for a-fib. Age = 83, wt =68 kg , scr = 1.45   Plan:  Apixaban 5 mg bid - as per home dose  Dorrene German 03/02/2019,10:24 PM

## 2019-03-02 NOTE — Telephone Encounter (Signed)
I spoke with caregiver regarding pt was administered Tramadol was given at the ER due to fall/Pt is confused after taking medication/ pt was released now confused/Caregiver states possibly reaction to medication.  I spoke with nurse and Dr [name] who advised to take pt to Urgent care.

## 2019-03-03 ENCOUNTER — Other Ambulatory Visit: Payer: Self-pay

## 2019-03-03 ENCOUNTER — Observation Stay (HOSPITAL_COMMUNITY): Payer: Medicare Other

## 2019-03-03 DIAGNOSIS — N390 Urinary tract infection, site not specified: Secondary | ICD-10-CM | POA: Diagnosis not present

## 2019-03-03 LAB — COMPREHENSIVE METABOLIC PANEL
ALT: 16 U/L (ref 0–44)
AST: 16 U/L (ref 15–41)
Albumin: 2.6 g/dL — ABNORMAL LOW (ref 3.5–5.0)
Alkaline Phosphatase: 75 U/L (ref 38–126)
Anion gap: 7 (ref 5–15)
BUN: 18 mg/dL (ref 8–23)
CO2: 22 mmol/L (ref 22–32)
Calcium: 7.8 mg/dL — ABNORMAL LOW (ref 8.9–10.3)
Chloride: 112 mmol/L — ABNORMAL HIGH (ref 98–111)
Creatinine, Ser: 1.1 mg/dL (ref 0.61–1.24)
GFR calc Af Amer: >60 mL/min (ref >60)
GFR calc non Af Amer: >60 mL/min (ref >60)
Glucose, Bld: 92 mg/dL (ref 70–99)
Potassium: 3.5 mmol/L (ref 3.5–5.1)
Sodium: 141 mmol/L (ref 135–145)
Total Bilirubin: 0.5 mg/dL (ref 0.3–1.2)
Total Protein: 5.6 g/dL — ABNORMAL LOW (ref 6.5–8.1)

## 2019-03-03 LAB — CBC
HCT: 30.2 % — ABNORMAL LOW (ref 39.0–52.0)
Hemoglobin: 9.3 g/dL — ABNORMAL LOW (ref 13.0–17.0)
MCH: 29.9 pg (ref 26.0–34.0)
MCHC: 30.8 g/dL (ref 30.0–36.0)
MCV: 97.1 fL (ref 80.0–100.0)
Platelets: 186 10*3/uL (ref 150–400)
RBC: 3.11 MIL/uL — ABNORMAL LOW (ref 4.22–5.81)
RDW: 15.4 % (ref 11.5–15.5)
WBC: 9.3 10*3/uL (ref 4.0–10.5)
nRBC: 0 % (ref 0.0–0.2)

## 2019-03-03 LAB — URINE CULTURE: Culture: 20000 — AB

## 2019-03-03 LAB — SARS CORONAVIRUS 2 (TAT 6-24 HRS): SARS Coronavirus 2: NEGATIVE

## 2019-03-03 MED ORDER — ROSUVASTATIN CALCIUM 20 MG PO TABS
40.0000 mg | ORAL_TABLET | Freq: Every day | ORAL | Status: DC
  Administered 2019-03-03: 40 mg via ORAL
  Filled 2019-03-03: qty 2

## 2019-03-03 MED ORDER — AMIODARONE HCL 200 MG PO TABS
200.0000 mg | ORAL_TABLET | Freq: Every day | ORAL | Status: DC
  Administered 2019-03-03 – 2019-03-04 (×2): 200 mg via ORAL
  Filled 2019-03-03 (×2): qty 1

## 2019-03-03 MED ORDER — PANTOPRAZOLE SODIUM 40 MG PO TBEC
40.0000 mg | DELAYED_RELEASE_TABLET | Freq: Every day | ORAL | Status: DC
  Administered 2019-03-03 – 2019-03-04 (×2): 40 mg via ORAL
  Filled 2019-03-03 (×2): qty 1

## 2019-03-03 MED ORDER — TRAMADOL HCL 50 MG PO TABS
50.0000 mg | ORAL_TABLET | Freq: Four times a day (QID) | ORAL | Status: DC | PRN
  Administered 2019-03-03 (×2): 50 mg via ORAL
  Filled 2019-03-03 (×2): qty 1

## 2019-03-03 MED ORDER — SENNOSIDES-DOCUSATE SODIUM 8.6-50 MG PO TABS: 1.0000 | ORAL_TABLET | Freq: Two times a day (BID) | ORAL | Status: DC | PRN

## 2019-03-03 MED ORDER — SODIUM CHLORIDE 0.9 % IV SOLN
1.0000 g | INTRAVENOUS | Status: DC
  Administered 2019-03-03: 1 g via INTRAVENOUS
  Filled 2019-03-03: qty 1
  Filled 2019-03-03: qty 10

## 2019-03-03 MED ORDER — TAMSULOSIN HCL 0.4 MG PO CAPS
0.4000 mg | ORAL_CAPSULE | Freq: Every day | ORAL | Status: DC
  Administered 2019-03-03: 0.4 mg via ORAL
  Filled 2019-03-03: qty 1

## 2019-03-03 NOTE — Evaluation (Signed)
Physical Therapy Evaluation Patient Details Name: Carl [name] MRN: [id number] DOB: [birth year] Today's Date: 03/03/2019   History of Present Illness  Carl [name]  is a 83 y.o. male,  With hypertension, hyperlipidemia, CKD stage3, AAA,  CAD s/p CABG, , Pafib CHF, moderate MR, pulmonary hypertension, h/o prostate cancer apparently presents with AMS, pt notes slight dysuria.  He has been admitted for Acute UTI.    Clinical Impression  Carl [name] is a 83 y.o. male admitted for above diagnosis and HPI. He reports at baseline he requires assistance for mobility and ADL's. He is currently limited by functional impairments (see PT problem list) and requires min assist for bed mobility, transfers, and gait. He was unsteady with RW and unsafe initially requiring verbal and tactile cues to improve safety with proximity to walker and wider BOS while ambulating. He will continue to benefit from skilled PT interventions with below venue to improve mobility and independence. Acute PT will follow.    Follow Up Recommendations Home health PT    Equipment Recommendations  None recommended by PT    Recommendations for Other Services       Precautions / Restrictions Precautions Precautions: Fall Restrictions Weight Bearing Restrictions: No      Mobility  Bed Mobility Overal bed mobility: Needs Assistance Bed Mobility: Rolling;Sidelying to Sit Rolling: Min assist Sidelying to sit: Min assist       General bed mobility comments: verbal/tactile cues for sequencing and min assist to initiate press up from Lt side-lying to sit EOB  Transfers Overall transfer level: Needs assistance Equipment used: Rolling walker (2 wheeled) Transfers: Sit to/from Stand Sit to Stand: Min assist         General transfer comment: cues for safe hand placement wtih RW and assist to initiate power up to stand  Ambulation/Gait Ambulation/Gait assistance: Min assist Gait Distance (Feet): 30  Feet Assistive device: Rolling walker (2 wheeled) Gait Pattern/deviations: Step-to pattern;Decreased step length - right;Decreased step length - left;Decreased stride length;Shuffle;Narrow base of support;Trunk flexed Gait velocity: slow   General Gait Details: patient unsteady and unsafe with RW initially requiring cues to slow down steps and keep RW closer to himself. cues for posture to improve upright standing and prevent pt from flexing forward and extending RW beyond BOS.  Stairs            Wheelchair Mobility    Modified Rankin (Stroke Patients Only)       Balance Overall balance assessment: Needs assistance Sitting-balance support: Feet supported;No upper extremity supported Sitting balance-Leahy Scale: Fair Sitting balance - Comments: pt able to sit EOB with dinner tray in front to feed himself at EOS   Standing balance support: Bilateral upper extremity supported;During functional activity Standing balance-Leahy Scale: Poor Standing balance comment: pt reliant on UE support for standing balance, able to don mask in standing but min assist required to maintain balance               High Level Balance Comments: performed side stepping at EOS for gait training with cues to maintain safe proximety with RW and faciliate wider stance with gait             Pertinent Vitals/Pain Pain Assessment: No/denies pain    Home Living Family/patient expects to be discharged to:: Private residence Living Arrangements: Children(pt's daughter and grandson) Available Help at Discharge: Family Type of Home: House Home Access: Stairs to enter Entrance Stairs-Rails: Right Entrance Stairs-Number of Steps: 6 on front and back Home  Layout: One level Home Equipment: Shower seat;Cane - single point;Walker - 2 wheels;Wheelchair - manual Additional Comments: pt takes sponge bath as he is fearful of falling in the bathroom    Prior Function Level of Independence: Independent with  assistive device(s)         Comments: pt reports he uses a RW in the house for mobility but doesn't move around much, enjoys watchign TV, reports his daugther and grandson help him with bathing and dressing and with some walking, he sleep in his recliner     Hand Dominance        Extremity/Trunk Assessment   Upper Extremity Assessment Upper Extremity Assessment: Overall WFL for tasks assessed    Lower Extremity Assessment Lower Extremity Assessment: Generalized weakness    Cervical / Trunk Assessment Cervical / Trunk Assessment: Kyphotic  Communication   Communication: No difficulties;HOH  Cognition Arousal/Alertness: Awake/alert Behavior During Therapy: WFL for tasks assessed/performed Overall Cognitive Status: Within Functional Limits for tasks assessed              General Comments: pt resting but easily alerted, pt oriented x4      General Comments      Exercises     Assessment/Plan    PT Assessment Patient needs continued PT services  PT Problem List Decreased strength;Decreased balance;Decreased knowledge of use of DME;Decreased mobility;Decreased activity tolerance;Decreased coordination;Decreased safety awareness       PT Treatment Interventions DME instruction;Functional mobility training;Balance training;Patient/family education;Gait training;Therapeutic activities;Stair training;Therapeutic exercise    PT Goals (Current goals can be found in the Care Plan section)  Acute Rehab PT Goals Patient Stated Goal: pt wants to return home PT Goal Formulation: With patient Time For Goal Achievement: 03/10/19 Potential to Achieve Goals: Good    Frequency Min 3X/week    AM-PAC PT "6 Clicks" Mobility  Outcome Measure Help needed turning from your back to your side while in a flat bed without using bedrails?: A Little Help needed moving from lying on your back to sitting on the side of a flat bed without using bedrails?: A Little Help needed moving to  and from a bed to a chair (including a wheelchair)?: A Little Help needed standing up from a chair using your arms (e.g., wheelchair or bedside chair)?: A Little Help needed to walk in hospital room?: A Little Help needed climbing 3-5 steps with a railing? : A Lot 6 Click Score: 17    End of Session Equipment Utilized During Treatment: Gait belt Activity Tolerance: Patient tolerated treatment well Patient left: in bed;with call bell/phone within reach;with bed alarm set;Other (comment)(seated EOB wtih dinner tray in front of him) Nurse Communication: Mobility status PT Visit Diagnosis: Unsteadiness on feet (R26.81);History of falling (Z91.81);Difficulty in walking, not elsewhere classified (R26.2);Other abnormalities of gait and mobility (R26.89);Muscle weakness (generalized) (M62.81)    Time: 7124-5809 PT Time Calculation (min) (ACUTE ONLY): 27 min   Charges:   PT Evaluation $PT Eval Low Complexity: 1 Low        Kipp Brood, PT, DPT, [facility] Physical Therapist with [facility]  03/03/2019 6:02 PM

## 2019-03-03 NOTE — Progress Notes (Signed)
CCMD notified this RN of patient having freq. SA block, saved strips on telemetry. Dr. Marylyn Ishihara notified of this finding via phone call. Primary nurse notified as well.

## 2019-03-03 NOTE — Plan of Care (Signed)

## 2019-03-03 NOTE — Progress Notes (Signed)
Carl [name]  PROGRESS NOTE    [name]  NOB:[id number] DOB: [birth year] DOA: 03/02/2019 PCP: [name], MD   Brief Narrative:   Carl [name]  is a 83 y.o. male,  With hypertension, hyperlipidemia, CKD stage3, AAA,  CAD s/p CABG, , Pafib CHF, moderate MR, pulmonary hypertension, h/o prostate cancer apparently presents with AMS, pt notes slight dysuria.  Denies fever, chills, n/v, flank pain, cough, cp, palp, sob, abd pain, diarrhea.    Assessment & Plan:   Principal Problem:   Acute lower UTI Active Problems:   Dyslipidemia   Essential hypertension   Persistent atrial fibrillation   AMS secondary to UTI     - he's pretty drowsy/sluggish this AM, but responding     - baseline mentation?  Acute lower uti     - on rocephin 1g qday; UCX pending     - will need 5 - 7 day abx Tx     - transition to PO in AM pending UCx data  Pafib     - Amiodarone, Eliquis  Hyperlipidemia     - crestor  GERD     - protonix  BPH     - flomax  Severe protein calorie malnutrition     - Prostat 50mL po bid     - dietician consult  DVT prophylaxis: eliquis Code Status: FULL Family Communication: None at bedside   Disposition Plan: TBD   Antimicrobials:  . Rocephin  ROS:  Denies CP, dyspnea, N/V . Remainder 10-pt ROS is negative for all not previously mentioned.  Subjective: "Huh? What? I'm... ok."  Objective: Vitals:   03/03/19 0059 03/03/19 0117 03/03/19 0527 03/03/19 1239  BP: (!) 144/79  140/74 125/70  Pulse: 73  76 66  Resp: 14  14 18   Temp: 98.2 F (36.8 C)  98 F (36.7 C) 99.1 F (37.3 C)  TempSrc: Oral  Oral Oral  SpO2: 100%  97% 99%  Weight:  70.3 kg    Height:  5' 8.5" (1.74 m)      Intake/Output Summary (Last 24 hours) at 03/03/2019 1544 Last data filed at 03/03/2019 1320 Gross per 24 hour  Intake 603 ml  Output 525 ml  Net 78 ml   Filed Weights   03/03/19 0117  Weight: 70.3 kg    Examination:  General: 83 y.o. male resting in bed in  NAD, thin appearing Eyes: PERRL, normal sclera ENMT: Nares patent w/o discharge, orophaynx clear, dentition normal, ears w/o discharge/lesions/ulcers Cardiovascular: RRR, +S1, S2, 2/6 SEM, no g/r, equal pulses throughout Respiratory: CTABL, no w/r/r, normal WOB GI: BS+, NDNT, no masses noted, no organomegaly noted MSK: No e/c/c Skin: No rashes, bruises, ulcerations noted Neuro: alert to name, follows commands, sluggish/drowsy, but cooperative   Data Reviewed: I have personally reviewed following labs and imaging studies.  CBC: Recent Labs  Lab 02/25/19 1518 03/02/19 1840 03/03/19 0431  WBC 6.8 8.7 9.3  NEUTROABS 5.3  --   --   HGB 10.6* 10.3* 9.3*  HCT 35.3* 33.6* 30.2*  MCV 97.0 96.8 97.1  PLT 224 210 947   Basic Metabolic Panel: Recent Labs  Lab 02/25/19 1518 03/02/19 1840 03/03/19 0431  NA 143 143 141  K 4.0 3.7 3.5  CL 111 110 112*  CO2 26 22 22   GLUCOSE 107* 104* 92  BUN 29* 26* 18  CREATININE 1.54* 1.45* 1.10  CALCIUM 8.7* 8.5* 7.8*   GFR: Estimated Creatinine Clearance: 51.8 mL/min (by C-G formula based on SCr of  1.1 mg/dL). Liver Function Tests: Recent Labs  Lab 02/25/19 1518 03/02/19 1840 03/03/19 0431  AST 31 18 16   ALT 26 21 16   ALKPHOS 79 82 75  BILITOT 0.5 0.3 0.5  PROT 6.5 6.2* 5.6*  ALBUMIN 2.9* 2.8* 2.6*   No results for input(s): LIPASE, AMYLASE in the last 168 hours. No results for input(s): AMMONIA in the last 168 hours. Coagulation Profile: No results for input(s): INR, PROTIME in the last 168 hours. Cardiac Enzymes: No results for input(s): CKTOTAL, CKMB, CKMBINDEX, TROPONINI in the last 168 hours. BNP (last 3 results) No results for input(s): PROBNP in the last 8760 hours. HbA1C: No results for input(s): HGBA1C in the last 72 hours. CBG: No results for input(s): GLUCAP in the last 168 hours. Lipid Profile: No results for input(s): CHOL, HDL, LDLCALC, TRIG, CHOLHDL, LDLDIRECT in the last 72 hours. Thyroid Function Tests: No  results for input(s): TSH, T4TOTAL, FREET4, T3FREE, THYROIDAB in the last 72 hours. Anemia Panel: No results for input(s): VITAMINB12, FOLATE, FERRITIN, TIBC, IRON, RETICCTPCT in the last 72 hours. Sepsis Labs: Recent Labs  Lab 03/02/19 1840  LATICACIDVEN 0.9    Recent Results (from the past 240 hour(s))  Urine culture     Status: Abnormal   Collection Time: 02/25/19  5:10 PM   Specimen: Urine, Random  Result Value Ref Range Status   Specimen Description   Final    URINE, RANDOM Performed at [facility] [address] [place], [place] [address]    Special Requests   Final    NONE Performed at [facility], [place] [address], [place], [place] [address]    Culture 30,000 COLONIES/mL YEAST (A)  Final   Report Status 02/28/2019 FINAL  Final  SARS CORONAVIRUS 2 Nasal Swab Aptima Multi Swab     Status: None   Collection Time: 03/03/19 12:06 AM   Specimen: Aptima Multi Swab; Nasal Swab  Result Value Ref Range Status   SARS Coronavirus 2 NEGATIVE NEGATIVE Final    Comment: (NOTE) SARS-CoV-2 target nucleic acids are NOT DETECTED. The SARS-CoV-2 RNA is generally detectable in upper and lower respiratory specimens during the acute phase of infection. Negative results do not preclude SARS-CoV-2 infection, do not rule out co-infections with other pathogens, and should not be used as the sole basis for treatment or other patient management decisions. Negative results must be combined with clinical observations, patient history, and epidemiological information. The expected result is Negative. Fact Sheet for Patients: SugarRoll.be Fact Sheet for Healthcare Providers: https://www.woods-mathews.com/ This test is not yet approved or cleared by the [place] FDA and  has been authorized for detection and/or diagnosis of SARS-CoV-2 by FDA under an Emergency Use Authorization (EUA). This EUA will remain  in  effect (meaning this test can be used) for the duration of the COVID-19 declaration under Section 56 4(b)(1) of the Act, 21 U.S.C. section 360bbb-3(b)(1), unless the authorization is terminated or revoked sooner. Performed at [facility], [place] [address] [place], [place] [address]       Radiology Studies: Ct Head Wo Contrast  Result Date: 03/03/2019 CLINICAL DATA:  Altered level of consciousness EXAM: CT HEAD WITHOUT CONTRAST TECHNIQUE: Contiguous axial images were obtained from the base of the skull through the vertex without intravenous contrast. COMPARISON:  02/25/2019 FINDINGS: Brain: There is atrophy and chronic small vessel disease changes. Stable extra-axial mass lesion with peripheral calcifications most compatible with meningioma. No mass effect or midline shift. No hydrocephalus, acute  infarction or hemorrhage. Vascular: No hyperdense vessel or unexpected calcification. Skull: No acute calvarial abnormality. Sinuses/Orbits: Visualized paranasal sinuses and mastoids clear. Orbital soft tissues unremarkable. Other: None IMPRESSION: Atrophy, chronic microvascular disease. No acute intracranial abnormality. Electronically Signed   By: Rolm Baptise M.D.   On: 03/03/2019 00:39   Dg Hip Unilat W Or W/o Pelvis 2-3 Views Right  Result Date: 03/02/2019 CLINICAL DATA:  Right hip pain secondary to a fall last week. Right total hip arthroplasty in the March 2020 EXAM: DG HIP (WITH OR WITHOUT PELVIS) 2-3V RIGHT COMPARISON:  Radiograph dated 10/07/2018 FINDINGS: There is no fracture or dislocation. The components of the hip prosthesis appear in good position. Multiple radioactive seeds in the prostate are noted. IMPRESSION: 1. No acute abnormalities. 2. Hardware intact. Electronically Signed   By: Lorriane Shire M.D.   On: 03/02/2019 20:55     Scheduled Meds: . amiodarone  200 mg Oral Daily  . apixaban  5 mg Oral BID  . feeding supplement (PRO-STAT SUGAR FREE 64)  30 mL Oral BID  .  pantoprazole  40 mg Oral Daily  . rosuvastatin  40 mg Oral q1800  . sodium chloride flush  3 mL Intravenous Q12H  . tamsulosin  0.4 mg Oral QPC supper   Continuous Infusions: . sodium chloride    . cefTRIAXone (ROCEPHIN)  IV       LOS: 0 days    Time spent: 25 minutes spent in the coordination of care today.   Jonnie Finner, DO [facility] Pager [phone]  If 7PM-7AM, please contact night-coverage [email or web address] Password [facility] 03/03/2019, 3:44 PM

## 2019-03-03 NOTE — ED Notes (Signed)
ED TO INPATIENT HANDOFF REPORT  Name/Age/Gender Carl [name] 83 y.o. male  Code Status    Code Status Orders  (From admission, onward)         Start     Ordered   03/02/19 2156  Full code  Continuous     03/02/19 2157        Code Status History    Date Active Date Inactive Code Status Order ID Comments User Context   10/05/2018 1758 10/12/2018 0206 Full Code [id number]  Karie Kirks, DO ED   08/05/2018 0007 08/09/2018 1918 Full Code [id number]  Etta Quill, DO ED   01/05/2018 0309 01/06/2018 2143 Full Code [id number]  Rise Patience, MD ED   03/12/2017 1628 03/17/2017 2043 Full Code [id number]  Murlean Iba, MD Inpatient   Advance Care Planning Activity      Home/SNF/Other Home  Chief Complaint altered mental status  Level of Care/Admitting Diagnosis ED Disposition    ED Disposition Condition [facility]: [facility] [[id number]]  Level of Care: Telemetry [5]  Admit to tele based on following criteria: Monitor for Ischemic changes  Covid Evaluation: Asymptomatic Screening Protocol (No Symptoms)  Diagnosis: Acute lower UTI [376283]  Admitting Physician: Jani Gravel [[id number]]  Attending Physician: Jani Gravel [[id number]]  PT Class (Do Not Modify): Observation [104]  PT Acc Code (Do Not Modify): Observation [10022]       Medical History Past Medical History:  Diagnosis Date  . (HFpEF) heart failure with preserved ejection fraction ([place])    a. 08/2016 Echo: EF 50-55%.  [name] AAA (abdominal aortic aneurysm) ([place])    CT abd. dated 06-18-2018 in epic,  3.1cm;  followed by pcp  . Arthritis    arms  . Bladder calculus   . Chronic anemia   . Chronic anxiety   . CKD (chronic kidney disease), stage III ([place])   . Coronary artery disease CARDIOLOGIST-  DR Rockey Situ   a. CABG 2006 with  LIMA-LAD, SVG-Ramus, SVG-OM, seq SVG-PLV-PDA.  . DOE (dyspnea on exertion)   . GERD (gastroesophageal reflux disease)   . Hematuria   . History of  bladder stone   . History of non-ST elevation myocardial infarction (NSTEMI) 03/30/2005   post op recovery inguinal hernia repair  . History of recurrent UTIs    last admission 08-04-2018 pseudomonas UTI  . Hyperlipidemia   . Hyperplasia of prostate without lower urinary tract symptoms (LUTS)   . Hypertension   . Ischemic cardiomyopathy    a. echo (01/13) 45-50% septal akinesis, mild MR; b. Echo  (01/16) ef 60-65%, mid-apical anteroseptal hypokinesis; c. echo 09-16-2016 ef 50-55%  . Left ureteral stone   . Lung nodule    noted 08/ 2018;  followed by pcp  . Moderate mitral regurgitation    a.08/2016 Echo: Mod MR/TR.  [name] Orthostatic hypotension   . PAC (premature atrial contraction)   . Paroxysmal atrial flutter ([place])    a. dx 08/2016, started on amiodarone.  . Persistent atrial fibrillation followed by cardiologist-- dr Rockey Situ   a. dx AFib 08/2016 s/p DCCV 10/2016-->eliquis (CHA2DS2VASc = 5).  . Pulmonary hypertension ([place])    last echo in epic 09-16-2016  . Recurrent prostate cancer ([place]) UROLOGIST-- DR BELL   s/p  radioactive prostate seed implants in 1997;    due to PSA rising pt has been doing lupron injection's few past several yrs at dr bell office  . Renal calculi    bilateral per  CT 06-18-2018  . S/P CABG x 5 04/04/2005   LIMA to LAD,  SVG to Ramus,  SVG to OM,  seqSVG to PLV and PDA  . Wears glasses     Allergies Allergies  Allergen Reactions  . Ciprofloxacin Other (See Comments)    PER HEART DR    IV Location/Drains/Wounds Patient Lines/Drains/Airways Status   Active Line/Drains/Airways    Name:   Placement date:   Placement time:   Site:   Days:   Peripheral IV Anterior;Left Forearm   -    -    Forearm      Peripheral IV 02/25/19 Left Antecubital   02/25/19    1525    Antecubital   6   Peripheral IV 03/02/19 Left Hand   03/02/19    1825    Hand   1   External Urinary Catheter   02/25/19    1530    -   6   Incision (Closed) 10/07/18 Hip Right   10/07/18    1537      147   Pressure Injury 10/09/18 Stage II -  Partial thickness loss of dermis presenting as a shallow open ulcer with a red, pink wound bed without slough. pt had this wound from home per his report; chronic partial thickness wounds related to shear, not pressur   10/09/18    0730     145          Labs/Imaging Results for orders placed or performed during the hospital encounter of 03/02/19 (from the past 48 hour(s))  CBC     Status: Abnormal   Collection Time: 03/02/19  6:40 PM  Result Value Ref Range   WBC 8.7 4.0 - 10.5 K/uL   RBC 3.47 (L) 4.22 - 5.81 MIL/uL   Hemoglobin 10.3 (L) 13.0 - 17.0 g/dL   HCT 33.6 (L) 39.0 - 52.0 %   MCV 96.8 80.0 - 100.0 fL   MCH 29.7 26.0 - 34.0 pg   MCHC 30.7 30.0 - 36.0 g/dL   RDW 15.3 11.5 - 15.5 %   Platelets 210 150 - 400 K/uL   nRBC 0.0 0.0 - 0.2 %    Comment: Performed at [facility], [place] [address] [place], [place] [address]  Comprehensive metabolic panel     Status: Abnormal   Collection Time: 03/02/19  6:40 PM  Result Value Ref Range   Sodium 143 135 - 145 mmol/L   Potassium 3.7 3.5 - 5.1 mmol/L   Chloride 110 98 - 111 mmol/L   CO2 22 22 - 32 mmol/L   Glucose, Bld 104 (H) 70 - 99 mg/dL   BUN 26 (H) 8 - 23 mg/dL   Creatinine, Ser 1.45 (H) 0.61 - 1.24 mg/dL   Calcium 8.5 (L) 8.9 - 10.3 mg/dL   Total Protein 6.2 (L) 6.5 - 8.1 g/dL   Albumin 2.8 (L) 3.5 - 5.0 g/dL   AST 18 15 - 41 U/L   ALT 21 0 - 44 U/L   Alkaline Phosphatase 82 38 - 126 U/L   Total Bilirubin 0.3 0.3 - 1.2 mg/dL   GFR calc non Af Amer 45 (L) >60 mL/min   GFR calc Af Amer 52 (L) >60 mL/min   Anion gap 11 5 - 15    Comment: Performed at [facility], [place] [address] [place], [place] [address]  Lactic acid, plasma     Status: None   Collection Time: 03/02/19  6:40 PM  Result Value Ref Range   Lactic Acid, Venous 0.9 0.5 - 1.9 mmol/L    Comment: Performed at [facility], [place] [address] [place], [place]  [address]  Urinalysis, Routine w reflex microscopic     Status: Abnormal   Collection Time: 03/02/19  8:34 PM  Result Value Ref Range   Color, Urine YELLOW YELLOW   APPearance HAZY (A) CLEAR   Specific Gravity, Urine 1.016 1.005 - 1.030   pH 6.0 5.0 - 8.0   Glucose, UA NEGATIVE NEGATIVE mg/dL   Hgb urine dipstick LARGE (A) NEGATIVE   Bilirubin Urine NEGATIVE NEGATIVE   Ketones, ur NEGATIVE NEGATIVE mg/dL   Protein, ur 30 (A) NEGATIVE mg/dL   Nitrite NEGATIVE NEGATIVE   Leukocytes,Ua LARGE (A) NEGATIVE   RBC / HPF >50 (H) 0 - 5 RBC/hpf   WBC, UA >50 (H) 0 - 5 WBC/hpf   Bacteria, UA RARE (A) NONE SEEN   Squamous Epithelial / LPF 0-5 0 - 5   Mucus PRESENT    Budding Yeast PRESENT     Comment: Performed at [facility], [place] [address], [place],  [address]   Dg Hip Unilat W Or W/o Pelvis 2-3 Views Right  Result Date: 03/02/2019 CLINICAL DATA:  Right hip pain secondary to a fall last week. Right total hip arthroplasty in the March 2020 EXAM: DG HIP (WITH OR WITHOUT PELVIS) 2-3V RIGHT COMPARISON:  Radiograph dated 10/07/2018 FINDINGS: There is no fracture or dislocation. The components of the hip prosthesis appear in good position. Multiple radioactive seeds in the prostate are noted. IMPRESSION: 1. No acute abnormalities. 2. Hardware intact. Electronically Signed   By: Lorriane Shire M.D.   On: 03/02/2019 20:55    Pending Labs Unresulted Labs (From admission, onward)    Start     Ordered   03/03/19 0500  Comprehensive metabolic panel  Tomorrow morning,   R     03/02/19 2157   03/03/19 0500  CBC  Tomorrow morning,   R     03/02/19 2157   03/02/19 2326  SARS CORONAVIRUS 2 Nasal Swab Aptima Multi Swab  (Asymptomatic/Tier 2 Patients Labs)  Once,   STAT    Question Answer Comment  Is this test for diagnosis or screening Screening   Symptomatic for COVID-19 as defined by CDC No   Hospitalized for COVID-19 No   Admitted to ICU for COVID-19 No   Previously tested for  COVID-19 No   Resident in a congregate (group) care setting No   Employed in healthcare setting No      03/02/19 2326   03/02/19 2134  Urine Culture  Once,   STAT     03/02/19 2134          Vitals/Pain Today's Vitals   03/02/19 2000 03/02/19 2030 03/02/19 2205 03/02/19 2230  BP: (!) 149/78 (!) 143/82 (!) 160/86 (!) 148/81  Pulse: 69 90 79 73  Resp: 15 14 13 15   Temp:      TempSrc:      SpO2: 98% 99% 98% 98%    Isolation Precautions No active isolations  Medications Medications  sodium chloride flush (NS) 0.9 % injection 3 mL (has no administration in time range)  sodium chloride flush (NS) 0.9 % injection 3 mL (has no administration in time range)  0.9 %  sodium chloride infusion (has no administration in time range)  acetaminophen (TYLENOL) tablet 650 mg (has no administration in time range)    Or  acetaminophen (TYLENOL) suppository 650 mg (has no administration in time range)  feeding supplement (PRO-STAT SUGAR FREE 64) liquid 30 mL (has no administration in time range)  apixaban (ELIQUIS) tablet 5 mg (has no administration in time range)  sodium chloride 0.9 % bolus 1,000 mL (0 mLs Intravenous Stopped 03/02/19 2057)  sodium chloride 0.9 % bolus 1,000 mL (0 mLs Intravenous Stopped 03/02/19 2216)  cefTRIAXone (ROCEPHIN) 1 g in sodium chloride 0.9 % 100 mL IVPB (1 g Intravenous New Bag/Given 03/02/19 2213)  sodium chloride 0.9 % bolus 1,000 mL (1,000 mLs Intravenous New Bag/Given 03/02/19 2209)  fluconazole (DIFLUCAN) tablet 200 mg (200 mg Oral Given 03/02/19 2215)    Mobility walks with person assist

## 2019-03-04 DIAGNOSIS — N390 Urinary tract infection, site not specified: Secondary | ICD-10-CM | POA: Diagnosis not present

## 2019-03-04 MED ORDER — AMOXICILLIN-POT CLAVULANATE 875-125 MG PO TABS: 1.0000 | ORAL_TABLET | Freq: Two times a day (BID) | ORAL | 0 refills | Status: AC

## 2019-03-04 NOTE — Progress Notes (Signed)
Physical Therapy Treatment Patient Details Name: Carl [name] MRN: [id number] DOB: 09-22-1935 Today's Date: 03/04/2019    History of Present Illness Shahan Starks  is a 83 y.o. male,  With hypertension, hyperlipidemia, CKD stage3, AAA,  CAD s/p CABG, , Pafib CHF, moderate MR, pulmonary hypertension, h/o prostate cancer apparently presents with AMS, pt notes slight dysuria.  He has been admitted for Acute UTI.    PT Comments    Patient continues to require min assist for bed mobility with cues to sequencing safely and manage LE movement and trunk press up. He continues to require min assist with RW for sit<>stand transfers and gait and remains unsteady with shuffling gait, knees flexed throughout ambulation. Improved with some cues to facilitate upright posture and stand straight but patient unable to maintain while ambulating. He will continue to benefit from skilled PT at below venue when medically ready to return home to address functional impairments (see PT problem list). Acute will follow.   Follow Up Recommendations  Home health PT     Equipment Recommendations  None recommended by PT    Recommendations for Other Services       Precautions / Restrictions Precautions Precautions: Fall Restrictions Weight Bearing Restrictions: No    Mobility  Bed Mobility Overal bed mobility: Needs Assistance Bed Mobility: Rolling;Sidelying to Sit Rolling: Min assist Sidelying to sit: Min assist       General bed mobility comments: pt continues to require verbal/tactile cues for sequencing and min assist for LE positioning, pt able to initaite press up with cues for hand placement  Transfers Overall transfer level: Needs assistance Equipment used: Rolling walker (2 wheeled) Transfers: Sit to/from Stand Sit to Stand: Min assist         General transfer comment: cues for safe hand placement wtih RW and assist to initiate power up to stand from EOB and  toilet  Ambulation/Gait Ambulation/Gait assistance: Min assist Gait Distance (Feet): 80 Feet Assistive device: Rolling walker (2 wheeled) Gait Pattern/deviations: Step-to pattern;Decreased step length - right;Decreased step length - left;Decreased stride length;Shuffle;Narrow base of support;Trunk flexed Gait velocity: slow   General Gait Details: patient remains unsteady/unsafe with RW requiring cues to slow down steps and keep RW closer. Pt with slightly improvd posture today. Patient continuing to flex forward with RW extended during turns   Marine scientist [place] (Stroke Patients Only)       Balance Overall balance assessment: Needs assistance Sitting-balance support: Feet supported;No upper extremity supported Sitting balance-Leahy Scale: Fair Sitting balance - Comments: pt able to sitt on toilet and maintain balance   Standing balance support: Bilateral upper extremity supported;During functional activity Standing balance-Leahy Scale: Poor Standing balance comment: pt reliant on UE support for standing balance, able to don mask in standing                 Cognition Arousal/Alertness: Awake/alert Behavior During Therapy: WFL for tasks assessed/performed Overall Cognitive Status: Within Functional Limits for tasks assessed                 Exercises      General Comments        Pertinent Vitals/Pain Pain Assessment: Faces Faces Pain Scale: Hurts a little bit Pain Location: Rt side of abdomen Pain Descriptors / Indicators: Sore Pain Intervention(s): Limited activity within patient's tolerance;Monitored during session           PT  Goals (current goals can now be found in the care plan section) Acute Rehab PT Goals Patient Stated Goal: pt wants to return home PT Goal Formulation: With patient Time For Goal Achievement: 03/10/19 Potential to Achieve Goals: Good Progress towards PT goals: Progressing toward  goals    Frequency    Min 3X/week      PT Plan Current plan remains appropriate       AM-PAC PT "6 Clicks" Mobility   Outcome Measure  Help needed turning from your back to your side while in a flat bed without using bedrails?: A Little Help needed moving from lying on your back to sitting on the side of a flat bed without using bedrails?: A Little Help needed moving to and from a bed to a chair (including a wheelchair)?: A Little Help needed standing up from a chair using your arms (e.g., wheelchair or bedside chair)?: A Little Help needed to walk in hospital room?: A Little Help needed climbing 3-5 steps with a railing? : A Lot 6 Click Score: 17    End of Session Equipment Utilized During Treatment: Gait belt Activity Tolerance: Patient tolerated treatment well Patient left: with call bell/phone within reach;Other (comment);in chair;with chair alarm set Nurse Communication: Mobility status PT Visit Diagnosis: Unsteadiness on feet (R26.81);History of falling (Z91.81);Difficulty in walking, not elsewhere classified (R26.2);Other abnormalities of gait and mobility (R26.89);Muscle weakness (generalized) (M62.81)     Time: 3790-2409 PT Time Calculation (min) (ACUTE ONLY): 23 min  Charges:  $Gait Training: 8-22 mins                     Kipp Brood, PT, DPT, [facility] Physical Therapist with [facility]  03/04/2019 1:23 PM

## 2019-03-04 NOTE — Progress Notes (Signed)
Discharge medications gone over with daughter Cadan Maggart,  She denies having any questions.  Pt alert oriented and stable at time of discharge

## 2019-03-04 NOTE — Discharge Summary (Signed)
. Physician Discharge Summary  Carl [name] JOI:[id number] DOB: 08/22/1935 DOA: 03/02/2019  PCP: [name], MD  Admit date: 03/02/2019 Discharge date: 03/04/2019  Admitted From: Home Disposition:  Discharged to home with [facility]  Recommendations for Outpatient Follow-up:  1. Follow up with PCP in 1-2 weeks 2. Please obtain BMP/CBC in one week  Discharge Condition: Stable  CODE STATUS: FULL   Brief/Interim Summary: CharlesHamiltonis a83 y.o.male,With hypertension, hyperlipidemia, CKD stage3, AAA, CAD s/p CABG, , Pafib CHF, moderate MR, pulmonary hypertension, h/o prostate cancer apparently presents with AMS, pt notes slight dysuria. Denies fever, chills, n/v, flank pain, cough, cp, palp, sob, abd pain, diarrhea.   Discharge Diagnoses:  Principal Problem:   Acute lower UTI Active Problems:   Dyslipidemia   Essential hypertension   Persistent atrial fibrillation  AMS secondary to UTI     - he's pretty drowsy/sluggish this AM, but responding     - baseline mentation?     - bright, spry this AM; A&O x 3 and appropriately responsive  Acute lower uti     - on rocephin 1g qday; UCX pending     - will need 5 - 7 day abx Tx     - transition to PO in AM pending UCx data     - UCx w/ yeast; this grew last time without a bacteria; would continue abx w/ augmentin for a few more days; candiduria is typically not treated, monitor for now  Pafib     - Amiodarone, Eliquis  Hyperlipidemia     - crestor  GERD     - protonix  BPH     - flomax  Severe protein calorie malnutrition     - Prostat 75mL po bid     - dietician consult  Debility/generalized weakness     - PT rec's HHPT     - has RW at home  Discharge Instructions   Allergies as of 03/04/2019      Reactions   Ciprofloxacin Other (See Comments)   PER HEART DR      Medication List    TAKE these medications   amiodarone 200 MG tablet Commonly known as: PACERONE Take 1 tablet (200 mg total) by  mouth daily.   amoxicillin-clavulanate 875-125 MG tablet Commonly known as: Augmentin Take 1 tablet by mouth 2 (two) times daily for 3 days.   apixaban 5 MG Tabs tablet Commonly known as: Eliquis Take 1 tablet (5 mg total) by mouth 2 (two) times daily.   omeprazole 40 MG capsule Commonly known as: PRILOSEC Take 1 capsule (40 mg total) by mouth daily.   rosuvastatin 40 MG tablet Commonly known as: CRESTOR Take 1 tablet (40 mg total) by mouth daily.   senna-docusate 8.6-50 MG tablet Commonly known as: Senokot-S Take 1 tablet by mouth 2 (two) times daily. What changed:   when to take this  reasons to take this   tamsulosin 0.4 MG Caps capsule Commonly known as: FLOMAX Take 1 capsule (0.4 mg total) by mouth daily after supper.   TYLENOL 500 MG tablet Generic drug: acetaminophen Take 1,000 mg by mouth every 6 (six) hours as needed for mild pain (for pain).       Allergies  Allergen Reactions  . Ciprofloxacin Other (See Comments)    PER HEART DR     Procedures/Studies: Dg Chest 2 View  Result Date: 02/25/2019 CLINICAL DATA:  Cough EXAM: CHEST - 2 VIEW COMPARISON:  10/05/2018 FINDINGS: Postsurgical changes to the sternum and  mediastinum. Heart size is normal. Calcific aortic knob. No focal airspace consolidation, pleural effusion, or pneumothorax. Interval removal of a right-sided PICC line. IMPRESSION: No active cardiopulmonary disease. Electronically Signed   By: Davina Poke M.D.   On: 02/25/2019 15:58   Dg Shoulder Right  Result Date: 02/25/2019 CLINICAL DATA:  Status post fall x3 in the past 24 hours. Right shoulder pain. Initial encounter. EXAM: RIGHT SHOULDER - 2+ VIEW COMPARISON:  None. FINDINGS: There is no acute bony or joint abnormality. Acromioclavicular osteoarthritis is noted. Soft tissues unremarkable. IMPRESSION: No acute abnormality. Acromioclavicular osteoarthritis. Electronically Signed   By: Inge Rise M.D.   On: 02/25/2019 14:45   Ct Head  Wo Contrast  Result Date: 03/03/2019 CLINICAL DATA:  Altered level of consciousness EXAM: CT HEAD WITHOUT CONTRAST TECHNIQUE: Contiguous axial images were obtained from the base of the skull through the vertex without intravenous contrast. COMPARISON:  02/25/2019 FINDINGS: Brain: There is atrophy and chronic small vessel disease changes. Stable extra-axial mass lesion with peripheral calcifications most compatible with meningioma. No mass effect or midline shift. No hydrocephalus, acute infarction or hemorrhage. Vascular: No hyperdense vessel or unexpected calcification. Skull: No acute calvarial abnormality. Sinuses/Orbits: Visualized paranasal sinuses and mastoids clear. Orbital soft tissues unremarkable. Other: None IMPRESSION: Atrophy, chronic microvascular disease. No acute intracranial abnormality. Electronically Signed   By: Rolm Baptise M.D.   On: 03/03/2019 00:39   Ct Head Wo Contrast  Result Date: 02/25/2019 CLINICAL DATA:  Altered level of consciousness.  Fall. EXAM: CT HEAD WITHOUT CONTRAST TECHNIQUE: Contiguous axial images were obtained from the base of the skull through the vertex without intravenous contrast. COMPARISON:  CT head 10/26/2018 FINDINGS: Brain: Mild atrophy. Hypodensity in the white matter bilaterally consistent with chronic microvascular ischemia unchanged. No acute infarct or hemorrhage 10 x 20 mm extra-axial mass left frontal region unchanged. Slight calcification. This is most consistent with meningioma. No midline shift. Vascular: Negative for hyperdense vessel Skull: Negative Sinuses/Orbits: Mild mucosal edema paranasal sinuses. Negative orbit Other: None IMPRESSION: Atrophy and chronic microvascular ischemia. No acute infarct or hemorrhage 10 x 20 mm extra-axial mass left frontal region unchanged most likely meningioma. Electronically Signed   By: Franchot Gallo M.D.   On: 02/25/2019 15:48   Dg Hip Unilat W Or W/o Pelvis 2-3 Views Right  Result Date: 03/02/2019 CLINICAL  DATA:  Right hip pain secondary to a fall last week. Right total hip arthroplasty in the March 2020 EXAM: DG HIP (WITH OR WITHOUT PELVIS) 2-3V RIGHT COMPARISON:  Radiograph dated 10/07/2018 FINDINGS: There is no fracture or dislocation. The components of the hip prosthesis appear in good position. Multiple radioactive seeds in the prostate are noted. IMPRESSION: 1. No acute abnormalities. 2. Hardware intact. Electronically Signed   By: Lorriane Shire M.D.   On: 03/02/2019 20:55       Subjective: "How was my coronavirus test?"  Discharge Exam: Vitals:   03/03/19 2131 03/04/19 0502  BP: 124/72 137/77  Pulse: 64 65  Resp: 12 13  Temp: 98.1 F (36.7 C) 97.6 F (36.4 C)  SpO2: 97% 100%   Vitals:   03/03/19 1239 03/03/19 2131 03/04/19 0500 03/04/19 0502  BP: 125/70 124/72  137/77  Pulse: 66 64  65  Resp: 18 12  13   Temp: 99.1 F (37.3 C) 98.1 F (36.7 C)  97.6 F (36.4 C)  TempSrc: Oral Oral  Oral  SpO2: 99% 97%  100%  Weight:   71 kg   Height:  General: 83 y.o. male resting in bed in NAD, thin appearing Eyes: PERRL, normal sclera ENMT: Nares patent w/o discharge, orophaynx clear, dentition normal, ears w/o discharge/lesions/ulcers Cardiovascular: RRR, +S1, S2, 2/6 SEM, no g/r, equal pulses throughout Respiratory: CTABL, no w/r/r, normal WOB GI: BS+, NDNT, no masses noted, no organomegaly noted MSK: No e/c/c Neuro:A&O x 3, calm, cooperative    The results of significant diagnostics from this hospitalization (including imaging, microbiology, ancillary and laboratory) are listed below for reference.     Microbiology: Recent Results (from the past 240 hour(s))  Urine culture     Status: Abnormal   Collection Time: 02/25/19  5:10 PM   Specimen: Urine, Random  Result Value Ref Range Status   Specimen Description   Final    URINE, RANDOM Performed at [facility] [address] [place], [place] [address]    Special Requests   Final     NONE Performed at [facility], [place] [address], [place], [place] [address]    Culture 30,000 COLONIES/mL YEAST (A)  Final   Report Status 02/28/2019 FINAL  Final  Urine Culture     Status: Abnormal   Collection Time: 03/02/19  9:34 PM   Specimen: Urine, Clean Catch  Result Value Ref Range Status   Specimen Description   Final    URINE, CLEAN CATCH Performed at [facility], [place] [address], [place], [place] [address]    Special Requests   Final    NONE Performed at [facility], [place] [address], [place], [place] [address]    Culture 20,000 COLONIES/mL YEAST (A)  Final   Report Status 03/03/2019 FINAL  Final  SARS CORONAVIRUS 2 Nasal Swab Aptima Multi Swab     Status: None   Collection Time: 03/03/19 12:06 AM   Specimen: Aptima Multi Swab; Nasal Swab  Result Value Ref Range Status   SARS Coronavirus 2 NEGATIVE NEGATIVE Final    Comment: (NOTE) SARS-CoV-2 target nucleic acids are NOT DETECTED. The SARS-CoV-2 RNA is generally detectable in upper and lower respiratory specimens during the acute phase of infection. Negative results do not preclude SARS-CoV-2 infection, do not rule out co-infections with other pathogens, and should not be used as the sole basis for treatment or other patient management decisions. Negative results must be combined with clinical observations, patient history, and epidemiological information. The expected result is Negative. Fact Sheet for Patients: SugarRoll.be Fact Sheet for Healthcare Providers: https://www.woods-mathews.com/ This test is not yet approved or cleared by the [place] FDA and  has been authorized for detection and/or diagnosis of SARS-CoV-2 by FDA under an Emergency Use Authorization (EUA). This EUA will remain  in effect (meaning this test can be used) for the duration of the COVID-19 declaration under Section 56 4(b)(1) of the Act,  21 U.S.C. section 360bbb-3(b)(1), unless the authorization is terminated or revoked sooner. Performed at [facility], [place] [address], [place],  [address]      Labs: BNP (last 3 results) Recent Labs    10/05/18 1350  BNP 400.8*   Basic Metabolic Panel: Recent Labs  Lab 02/25/19 1518 03/02/19 1840 03/03/19 0431  NA 143 143 141  K 4.0 3.7 3.5  CL 111 110 112*  CO2 26 22 22   GLUCOSE 107* 104* 92  BUN 29* 26* 18  CREATININE 1.54* 1.45* 1.10  CALCIUM 8.7* 8.5* 7.8*   Liver Function Tests: Recent Labs  Lab 02/25/19 1518 03/02/19 1840 03/03/19 0431  AST 31 18 16   ALT 26 21 16   ALKPHOS 79 82 75  BILITOT 0.5 0.3 0.5  PROT 6.5 6.2* 5.6*  ALBUMIN 2.9* 2.8* 2.6*   No results for input(s): LIPASE, AMYLASE in the last 168 hours. No results for input(s): AMMONIA in the last 168 hours. CBC: Recent Labs  Lab 02/25/19 1518 03/02/19 1840 03/03/19 0431  WBC 6.8 8.7 9.3  NEUTROABS 5.3  --   --   HGB 10.6* 10.3* 9.3*  HCT 35.3* 33.6* 30.2*  MCV 97.0 96.8 97.1  PLT 224 210 186   Cardiac Enzymes: No results for input(s): CKTOTAL, CKMB, CKMBINDEX, TROPONINI in the last 168 hours. BNP: Invalid input(s): POCBNP CBG: No results for input(s): GLUCAP in the last 168 hours. D-Dimer No results for input(s): DDIMER in the last 72 hours. Hgb A1c No results for input(s): HGBA1C in the last 72 hours. Lipid Profile No results for input(s): CHOL, HDL, LDLCALC, TRIG, CHOLHDL, LDLDIRECT in the last 72 hours. Thyroid function studies No results for input(s): TSH, T4TOTAL, T3FREE, THYROIDAB in the last 72 hours.  Invalid input(s): FREET3 Anemia work up No results for input(s): VITAMINB12, FOLATE, FERRITIN, TIBC, IRON, RETICCTPCT in the last 72 hours. Urinalysis    Component Value Date/Time   COLORURINE YELLOW 03/02/2019 2034   APPEARANCEUR HAZY (A) 03/02/2019 2034   LABSPEC 1.016 03/02/2019 2034   PHURINE 6.0 03/02/2019 2034   GLUCOSEU NEGATIVE 03/02/2019 2034    HGBUR LARGE (A) 03/02/2019 2034   BILIRUBINUR NEGATIVE 03/02/2019 2034   BILIRUBINUR small 01/11/2018 [place] 03/02/2019 2034   PROTEINUR 30 (A) 03/02/2019 2034   UROBILINOGEN 0.2 01/11/2018 1225   NITRITE NEGATIVE 03/02/2019 2034   LEUKOCYTESUR LARGE (A) 03/02/2019 2034   Sepsis Labs Invalid input(s): PROCALCITONIN,  WBC,  LACTICIDVEN Microbiology Recent Results (from the past 240 hour(s))  Urine culture     Status: Abnormal   Collection Time: 02/25/19  5:10 PM   Specimen: Urine, Random  Result Value Ref Range Status   Specimen Description   Final    URINE, RANDOM Performed at [facility], [place] [address], [place], [place] [address]    Special Requests   Final    NONE Performed at [facility], [place] [address] [place], [place] [address]    Culture 30,000 COLONIES/mL YEAST (A)  Final   Report Status 02/28/2019 FINAL  Final  Urine Culture     Status: Abnormal   Collection Time: 03/02/19  9:34 PM   Specimen: Urine, Clean Catch  Result Value Ref Range Status   Specimen Description   Final    URINE, CLEAN CATCH Performed at [facility], [place] [address], [place],  [address]    Special Requests   Final    NONE Performed at [facility], [place] [address], [place], [place] [address]    Culture 20,000 COLONIES/mL YEAST (A)  Final   Report Status 03/03/2019 FINAL  Final  SARS CORONAVIRUS 2 Nasal Swab Aptima Multi Swab     Status: None   Collection Time: 03/03/19 12:06 AM   Specimen: Aptima Multi Swab; Nasal Swab  Result Value Ref Range Status   SARS Coronavirus 2 NEGATIVE NEGATIVE Final    Comment: (NOTE) SARS-CoV-2 target nucleic acids are NOT DETECTED. The SARS-CoV-2 RNA is generally detectable in upper and lower respiratory specimens during the acute phase of infection. Negative results do not preclude SARS-CoV-2 infection, do not rule out co-infections with other pathogens,  and should not be used as the sole basis for treatment or other patient management decisions. Negative results must be combined with clinical observations, patient history, and epidemiological information. The expected result is Negative. Fact Sheet for Patients: SugarRoll.be Fact Sheet for Healthcare Providers: https://www.woods-mathews.com/ This test is not yet approved or cleared by the [place] FDA and  has been authorized for detection and/or diagnosis of SARS-CoV-2 by FDA under an Emergency Use Authorization (EUA). This EUA will remain  in effect (meaning this test can be used) for the duration of the COVID-19 declaration under Section 56 4(b)(1) of the Act, 21 U.S.C. section 360bbb-3(b)(1), unless the authorization is terminated or revoked sooner. Performed at [facility], [place] [address] [place], [place] [address]      Time coordinating discharge: 35 minutes  SIGNED:   Jonnie Finner, DO  [facility] 03/04/2019, 11:27 AM Pager   If 7PM-7AM, please contact night-coverage [email or web address] Password [email or web address]

## 2019-03-04 NOTE — TOC Transition Note (Signed)
Transition of Care [facility]) - CM/SW Discharge Note   Patient Details  Name: Carl [name] MRN: [id number] Date of Birth: Jul 28, 1936  Transition of Care [facility]) CM/SW Contact:  Wende Neighbors, LCSW Phone Number: 03/04/2019, 12:34 PM   Clinical Narrative:  Per patient daughter will pick him up at hospital. patient is set up with [facility] for physical therapy     Final next level of care: Home w Home Health Services Barriers to Discharge: No Barriers Identified   Patient Goals and CMS Choice Patient states their goals for this hospitalization and ongoing recovery are:: to be home with family CMS Medicare.gov Compare Post Acute Care list provided to:: Patient Choice offered to / list presented to : Patient  Discharge Placement                Patient to be transferred to facility by: daughter   Patient and family notified of of transfer: 03/04/19  Discharge Plan and Services                          HH Arranged: PT [facility] Agency: [place] Date [place]: 03/04/19 Time [place]: 32 Representative spoke with at [place]: cory  Social Determinants of Health ([place]) Interventions     Readmission Risk Interventions No flowsheet data found.

## 2019-03-04 NOTE — TOC Initial Note (Signed)
Transition of Care [facility]) - Initial/Assessment Note    Patient Details  Name: Carl [name] MRN: [id number] Date of Birth: 1936/01/28  Transition of Care [facility]) CM/SW Contact:    Wende Neighbors, LCSW Phone Number: 03/04/2019, 12:32 PM  Clinical Narrative:   Patient agreeable to have [place] follow in the home. Patient stated he did not care switch agency followed him in the home as long as insurance would cover it. CSW was able to have [place] take patient insurance. Patient stated he is agreeable with agency. Patient stated he has support from adult daughter in the home              Expected Discharge Plan: [place] Barriers to Discharge: No Barriers Identified   Patient Goals and CMS Choice Patient states their goals for this hospitalization and ongoing recovery are:: to be home with family CMS Medicare.gov Compare Post Acute Care list provided to:: Patient Choice offered to / list presented to : Patient  Expected Discharge Plan and Services Expected Discharge Plan: [place]       Living arrangements for the past 2 months: Single Family Home Expected Discharge Date: 03/04/19                         HH Arranged: PT HH Agency: [place] Date [place]: 03/04/19 Time HH Agency Contacted: 84 Representative spoke with at [place]: cory  Prior Living Arrangements/Services Living arrangements for the past 2 months: [place] with:: Self, Adult Children Patient language and need for interpreter reviewed:: Yes Do you feel safe going back to the place where you live?: Yes      Need for Family Participation in Patient Care: Yes (Comment) Care giver support system in place?: Yes (comment)   Criminal Activity/Legal Involvement Pertinent to Current Situation/Hospitalization: No - Comment as needed  Activities of Daily Living Home Assistive Devices/Equipment: Wheelchair ADL Screening (condition at time  of admission) Patient's cognitive ability adequate to safely complete daily activities?: Yes Is the patient deaf or have difficulty hearing?: No Does the patient have difficulty seeing, even when wearing glasses/contacts?: No Does the patient have difficulty concentrating, remembering, or making decisions?: No Patient able to express need for assistance with ADLs?: Yes Does the patient have difficulty dressing or bathing?: Yes Independently performs ADLs?: No Communication: Independent Dressing (OT): Needs assistance Is this a change from baseline?: Pre-admission baseline Grooming: Needs assistance Is this a change from baseline?: Pre-admission baseline Feeding: Independent Bathing: Needs assistance Is this a change from baseline?: Pre-admission baseline Toileting: Needs assistance Is this a change from baseline?: Pre-admission baseline In/Out Bed: Needs assistance Is this a change from baseline?: Pre-admission baseline Walks in Home: Needs assistance Is this a change from baseline?: Pre-admission baseline Does the patient have difficulty walking or climbing stairs?: Yes Weakness of Legs: Both Weakness of Arms/Hands: Both  Permission Sought/Granted Permission sought to share information with : Family Supports    Share Information with NAME: loye vento     Permission granted to share info w Relationship: daughter  Permission granted to share info w Contact Information: [phone]  Emotional Assessment Appearance:: Appears stated age Attitude/Demeanor/Rapport: Engaged Affect (typically observed): Accepting, Pleasant Orientation: : Oriented to Self, Oriented to Place, Oriented to  Time, Oriented to Situation Alcohol / Substance Use: Not Applicable    Admission diagnosis:  Dehydration [E86.0] Confusion [R41.0] General weakness [R53.1] Acute UTI [N39.0] AKI (  acute kidney injury) ([place]) [N17.9] Acute alteration in mental status [R41.82] Patient Active Problem List    Diagnosis Date Noted  . Acute lower UTI 03/02/2019  . AMS (altered mental status) 03/02/2019  . Displaced fracture of right femoral neck ([place]) 10/07/2018  . Hip fracture ([place]) 10/05/2018  . Intracranial hematoma ([place]) 10/05/2018  . AAA (abdominal aortic aneurysm) ([place]) 08/26/2018  . Pseudomonas aeruginosa infection 08/06/2018  . Acute cystitis with hematuria   . Pseudomonas urinary tract infection 08/05/2018  . Hematuria 08/05/2018  . Pressure injury of skin 08/05/2018  . Kidney stones 06/30/2018  . Allergic rhinitis 04/26/2018  . Skin tear of elbow without complication 15/94/7076  . Complicated UTI (urinary tract infection) 01/05/2018  . Urolithiasis 01/05/2018  . Frequent urination 01/01/2018  . Colon polyps 09/15/2017  . GERD (gastroesophageal reflux disease) 09/15/2017  . Wrist pain, acute, right 05/25/2017  . Low back pain 03/24/2017  . Lung nodule seen on imaging study: 7 mm nodule left midlung 03/13/2017  . Physical deconditioning 03/13/2017  . Normocytic anemia 03/13/2017  . Fall 03/12/2017  . Abdominal soreness 03/12/2017  . Generalized weakness 03/12/2017  . Leukocytosis 03/12/2017  . Hyponatremia 03/12/2017  . Orthostatic hypotension 02/16/2017  . Blood in stool 09/19/2016  . Anticoagulated 09/19/2016  . History of prostate cancer 09/04/2016  . Persistent atrial fibrillation 09/04/2016  . Acute congestive heart failure ([place]) 09/04/2016  . Cardiomyopathy, ischemic 07/16/2012  . Chronic kidney disease (CKD), stage III (moderate) ([place]) 07/16/2012  . Essential hypertension 08/05/2011  . Murmur 08/05/2011  . 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 Dyslipidemia 02/04/2011   PCP:  [name], MD Pharmacy:   [id number] - [place], [place] [address] [address] Phone: [phone] Fax: [phone]     Social Determinants of Health (SDOH) Interventions    Readmission Risk  Interventions No flowsheet data found.

## 2019-03-07 ENCOUNTER — Telehealth: Payer: Self-pay

## 2019-03-07 ENCOUNTER — Ambulatory Visit: Payer: Self-pay | Admitting: Family Medicine

## 2019-03-07 NOTE — Telephone Encounter (Signed)
  Daughter Hubert Raatz called in c/o her father having hallucinations.  He was discharged from the hospital Friday due to a UTI.    (The call got disconnected).  She called back in and we resumed the call.  He is hallucinating bad today.   He has been confused some but nothing like today. I let her know he needed to go back to the ED.   [name] agreed to take him to the ED now.    I sent these notes to the office.      Reason for Disposition . Patient sounds very sick or weak to the triager    Having hallucinations per daughter  Answer Assessment - Initial Assessment Questions 1. MAIN CONCERN OR SYMPTOM:  "What is your main concern right now?" "What questions do you have?" "What's the main symptom you're worried about?" (e.g., confusion, memory loss)     He is hallucination now.  Daughter Jemar Paulsen calling in.  He just got out of the hospital Friday due to a UTI.   ( The call got disconnected at this point).   2. ONSET:  "When did the symptom start (or worsen)?" (minutes, hours, days, weeks)     *No Answer* 3. BETTER-SAME-WORSE: "Are you getting better, staying the same, or getting worse compared to the day you were discharged?"     *No Answer* 4.  DIAGNOSIS: "Was patient diagnosed with dementia by a doctor?" If yes, "When?" (e.g., days, months, years ago)     *No Answer* 5.  MEDICATION: "Has there been any change in medications recently?" (e.g., narcotics, antihistamines, benzodiazepines, etc.)     *No Answer* 6.  OTHER SYMPTOMS: "Are there any other symptoms?" (e.g., fever, cough, pain, falling)     *No Answer* 7. SUPPORT: Document living circumstances and support (e.g., family, nursing home)     *No Answer*  Answer Assessment - Initial Assessment Questions 1. LEVEL OF CONSCIOUSNESS: "How is he (she, the patient) acting right now?" (e.g., alert-oriented, confused, lethargic, stuporous, comatose)     Daughter Manuelito Poage called in concerned about her father  hallucinating "pretty bad today".     He has been confused a little but nothing like today. 2. ONSET: "When did the confusion start?"  (minutes, hours, days)     Today he is hallucinating bad. 3. PATTERN "Does this come and go, or has it been constant since it started?"  "Is it present now?"     Constant today. 4. ALCOHOL or DRUGS: "Has he been drinking alcohol or taking any drugs?"      *No Answer* 5. NARCOTIC MEDICATIONS: "Has he been receiving any narcotic medications?" (e.g., morphine, Vicodin)     *No Answer* 6. CAUSE: "What do you think is causing the confusion?"      He was just discharged from the hospital Friday for a UTI.   The doctor said he may have some mild demential but he has not be hallucinating.   7. OTHER SYMPTOMS: "Are there any other symptoms?" (e.g., difficulty breathing, headache, fever, weakness)     Not asked due to his hallucinations  Protocols used: CONFUSION - DELIRIUM-A-AH, DEMENTIA SYMPTOMS AND QUESTIONS-A-AH

## 2019-03-07 NOTE — Telephone Encounter (Signed)
Noted. Can we see if he would be willing to do his hospital follow-up with Joycelyn Schmid if she is willing so that he can be seen earlier if needed? I could also see him sooner if there is a cancellation, though it sounds as though he should be seen for follow-up sooner than next week. Please check with Joycelyn Schmid and see if she is ok doing his hospital follow-up (it could be a doxy visit if needed). Thanks.

## 2019-03-07 NOTE — Telephone Encounter (Addendum)
Transition Care Management Follow-up Telephone Call   Date discharged? 03/04/19   How have you been since you were released from the hospital?  Information received from daughter Anette H. (HIPAA complaint). She states, "although it has improved he is still confused in conversation with others and seems to be having audio/visiual hallucinations." Restless. Eating/drinking good. BM/voiding without issues. Reports the family has recommended he return to the hospital or urgent care for follow up with continued confusion and he declines.    Do you understand why you were in the hospital? Yes he does understand it was for UTI and confusion.    Do you understand the discharge instructions? Take antibiotic as directed, follow up with PMD in 1-2 weeks with BMP/CBC in 1 week.   Where were you discharged to? Home.    Items Reviewed:  Medications reviewed: Yes, daughter states she is managing the medications and he is taking without issues.   Allergies reviewed: Yes, none new.   Dietary changes reviewed: Yes, regular  Referrals reviewed: Yes, HHPT to start today.    Functional Questionnaire:   Activities of Daily Living (ADLs):   He states they are independent in the following: Eating, bathing, dressing, ambulating States they require assistance with the following: Meal prep, household chores. Daughter assists as needed.    Any transportation issues/concerns?: None. Daughter or grandson to drive him as needed.    Any patient concerns? None.   Confirmed importance and date/time of follow-up visits scheduled Yes, appointment scheduled 03/14/19 at 1030. OK for doxy if it needs to be changed. Needs BMP/CBC.    Provider Appointment booked with Dr.Sonnenberg, pcp.  Confirmed with patient if condition begins to worsen call PCP or go to the ER.  Patient was given the office number and encouraged to call back with question or concerns.  : Yes

## 2019-03-08 ENCOUNTER — Emergency Department: Payer: Medicare Other

## 2019-03-08 ENCOUNTER — Other Ambulatory Visit: Payer: Self-pay

## 2019-03-08 ENCOUNTER — Inpatient Hospital Stay
Admission: EM | Admit: 2019-03-08 | Discharge: 2019-03-10 | DRG: 689 | Disposition: A | Discharge status: Home Health | Payer: Medicare Other | Attending: Internal Medicine | Admitting: Internal Medicine

## 2019-03-08 ENCOUNTER — Encounter: Payer: Self-pay | Admitting: Emergency Medicine

## 2019-03-08 ENCOUNTER — Inpatient Hospital Stay: Payer: Medicare Other

## 2019-03-08 DIAGNOSIS — Z87891 Personal history of nicotine dependence: Secondary | ICD-10-CM | POA: Diagnosis not present

## 2019-03-08 DIAGNOSIS — I255 Ischemic cardiomyopathy: Secondary | ICD-10-CM | POA: Diagnosis present

## 2019-03-08 DIAGNOSIS — R5381 Other malaise: Secondary | ICD-10-CM | POA: Diagnosis present

## 2019-03-08 DIAGNOSIS — E860 Dehydration: Secondary | ICD-10-CM | POA: Diagnosis present

## 2019-03-08 DIAGNOSIS — I5032 Chronic diastolic (congestive) heart failure: Secondary | ICD-10-CM | POA: Diagnosis present

## 2019-03-08 DIAGNOSIS — N390 Urinary tract infection, site not specified: Secondary | ICD-10-CM | POA: Diagnosis present

## 2019-03-08 DIAGNOSIS — Z87442 Personal history of urinary calculi: Secondary | ICD-10-CM

## 2019-03-08 DIAGNOSIS — F419 Anxiety disorder, unspecified: Secondary | ICD-10-CM | POA: Diagnosis present

## 2019-03-08 DIAGNOSIS — N183 Chronic kidney disease, stage 3 (moderate): Secondary | ICD-10-CM | POA: Diagnosis present

## 2019-03-08 DIAGNOSIS — R4182 Altered mental status, unspecified: Secondary | ICD-10-CM | POA: Diagnosis present

## 2019-03-08 DIAGNOSIS — N179 Acute kidney failure, unspecified: Secondary | ICD-10-CM | POA: Diagnosis present

## 2019-03-08 DIAGNOSIS — I130 Hypertensive heart and chronic kidney disease with heart failure and stage 1 through stage 4 chronic kidney disease, or unspecified chronic kidney disease: Secondary | ICD-10-CM | POA: Diagnosis present

## 2019-03-08 DIAGNOSIS — D631 Anemia in chronic kidney disease: Secondary | ICD-10-CM | POA: Diagnosis present

## 2019-03-08 DIAGNOSIS — Z87440 Personal history of urinary (tract) infections: Secondary | ICD-10-CM | POA: Diagnosis not present

## 2019-03-08 DIAGNOSIS — Z881 Allergy status to other antibiotic agents status: Secondary | ICD-10-CM

## 2019-03-08 DIAGNOSIS — I272 Pulmonary hypertension, unspecified: Secondary | ICD-10-CM | POA: Diagnosis present

## 2019-03-08 DIAGNOSIS — I714 Abdominal aortic aneurysm, without rupture: Secondary | ICD-10-CM | POA: Diagnosis present

## 2019-03-08 DIAGNOSIS — E785 Hyperlipidemia, unspecified: Secondary | ICD-10-CM | POA: Diagnosis present

## 2019-03-08 DIAGNOSIS — R531 Weakness: Secondary | ICD-10-CM

## 2019-03-08 DIAGNOSIS — I2510 Atherosclerotic heart disease of native coronary artery without angina pectoris: Secondary | ICD-10-CM | POA: Diagnosis present

## 2019-03-08 DIAGNOSIS — N189 Chronic kidney disease, unspecified: Secondary | ICD-10-CM | POA: Diagnosis not present

## 2019-03-08 DIAGNOSIS — K8020 Calculus of gallbladder without cholecystitis without obstruction: Secondary | ICD-10-CM | POA: Diagnosis present

## 2019-03-08 DIAGNOSIS — I340 Nonrheumatic mitral (valve) insufficiency: Secondary | ICD-10-CM | POA: Diagnosis present

## 2019-03-08 DIAGNOSIS — K219 Gastro-esophageal reflux disease without esophagitis: Secondary | ICD-10-CM | POA: Diagnosis present

## 2019-03-08 DIAGNOSIS — Z8781 Personal history of (healed) traumatic fracture: Secondary | ICD-10-CM | POA: Diagnosis not present

## 2019-03-08 DIAGNOSIS — I252 Old myocardial infarction: Secondary | ICD-10-CM | POA: Diagnosis not present

## 2019-03-08 DIAGNOSIS — R410 Disorientation, unspecified: Secondary | ICD-10-CM | POA: Diagnosis not present

## 2019-03-08 DIAGNOSIS — Z96641 Presence of right artificial hip joint: Secondary | ICD-10-CM | POA: Diagnosis not present

## 2019-03-08 DIAGNOSIS — I4819 Other persistent atrial fibrillation: Secondary | ICD-10-CM | POA: Diagnosis present

## 2019-03-08 DIAGNOSIS — I5030 Unspecified diastolic (congestive) heart failure: Secondary | ICD-10-CM | POA: Diagnosis not present

## 2019-03-08 DIAGNOSIS — Z8546 Personal history of malignant neoplasm of prostate: Secondary | ICD-10-CM | POA: Diagnosis not present

## 2019-03-08 DIAGNOSIS — R443 Hallucinations, unspecified: Secondary | ICD-10-CM | POA: Diagnosis not present

## 2019-03-08 DIAGNOSIS — N400 Enlarged prostate without lower urinary tract symptoms: Secondary | ICD-10-CM | POA: Diagnosis present

## 2019-03-08 DIAGNOSIS — Z951 Presence of aortocoronary bypass graft: Secondary | ICD-10-CM | POA: Diagnosis not present

## 2019-03-08 DIAGNOSIS — Z20828 Contact with and (suspected) exposure to other viral communicable diseases: Secondary | ICD-10-CM | POA: Diagnosis present

## 2019-03-08 DIAGNOSIS — W19XXXA Unspecified fall, initial encounter: Secondary | ICD-10-CM | POA: Diagnosis present

## 2019-03-08 DIAGNOSIS — Z8249 Family history of ischemic heart disease and other diseases of the circulatory system: Secondary | ICD-10-CM

## 2019-03-08 DIAGNOSIS — E876 Hypokalemia: Secondary | ICD-10-CM | POA: Diagnosis present

## 2019-03-08 DIAGNOSIS — Z7901 Long term (current) use of anticoagulants: Secondary | ICD-10-CM

## 2019-03-08 DIAGNOSIS — G9341 Metabolic encephalopathy: Secondary | ICD-10-CM | POA: Diagnosis present

## 2019-03-08 DIAGNOSIS — Z79899 Other long term (current) drug therapy: Secondary | ICD-10-CM

## 2019-03-08 DIAGNOSIS — Z923 Personal history of irradiation: Secondary | ICD-10-CM | POA: Diagnosis not present

## 2019-03-08 DIAGNOSIS — C61 Malignant neoplasm of prostate: Secondary | ICD-10-CM | POA: Diagnosis not present

## 2019-03-08 DIAGNOSIS — N200 Calculus of kidney: Secondary | ICD-10-CM | POA: Diagnosis present

## 2019-03-08 DIAGNOSIS — R8281 Pyuria: Secondary | ICD-10-CM | POA: Diagnosis not present

## 2019-03-08 LAB — COMPREHENSIVE METABOLIC PANEL
ALT: 20 U/L (ref 0–44)
AST: 19 U/L (ref 15–41)
Albumin: 3.1 g/dL — ABNORMAL LOW (ref 3.5–5.0)
Alkaline Phosphatase: 161 U/L — ABNORMAL HIGH (ref 38–126)
Anion gap: 10 (ref 5–15)
BUN: 19 mg/dL (ref 8–23)
CO2: 22 mmol/L (ref 22–32)
Calcium: 8.8 mg/dL — ABNORMAL LOW (ref 8.9–10.3)
Chloride: 108 mmol/L (ref 98–111)
Creatinine, Ser: 1.63 mg/dL — ABNORMAL HIGH (ref 0.61–1.24)
GFR calc Af Amer: 45 mL/min — ABNORMAL LOW (ref >60)
GFR calc non Af Amer: 39 mL/min — ABNORMAL LOW (ref >60)
Glucose, Bld: 105 mg/dL — ABNORMAL HIGH (ref 70–99)
Potassium: 3.8 mmol/L (ref 3.5–5.1)
Sodium: 140 mmol/L (ref 135–145)
Total Bilirubin: 0.4 mg/dL (ref 0.3–1.2)
Total Protein: 6.4 g/dL — ABNORMAL LOW (ref 6.5–8.1)

## 2019-03-08 LAB — CBC WITH DIFFERENTIAL/PLATELET
Abs Immature Granulocytes: 0.07 10*3/uL (ref 0.00–0.07)
Basophils Absolute: 0 10*3/uL (ref 0.0–0.1)
Basophils Relative: 0 %
Eosinophils Absolute: 0 10*3/uL (ref 0.0–0.5)
Eosinophils Relative: 0 %
HCT: 34.4 % — ABNORMAL LOW (ref 39.0–52.0)
Hemoglobin: 10.5 g/dL — ABNORMAL LOW (ref 13.0–17.0)
Immature Granulocytes: 0 %
Lymphocytes Relative: 3 %
Lymphs Abs: 0.5 10*3/uL — ABNORMAL LOW (ref 0.7–4.0)
MCH: 29.3 pg (ref 26.0–34.0)
MCHC: 30.5 g/dL (ref 30.0–36.0)
MCV: 96.1 fL (ref 80.0–100.0)
Monocytes Absolute: 0.7 10*3/uL (ref 0.1–1.0)
Monocytes Relative: 4 %
Neutro Abs: 14.9 10*3/uL — ABNORMAL HIGH (ref 1.7–7.7)
Neutrophils Relative %: 93 %
Platelets: 249 10*3/uL (ref 150–400)
RBC: 3.58 MIL/uL — ABNORMAL LOW (ref 4.22–5.81)
RDW: 15.7 % — ABNORMAL HIGH (ref 11.5–15.5)
WBC: 16.2 10*3/uL — ABNORMAL HIGH (ref 4.0–10.5)
nRBC: 0 % (ref 0.0–0.2)

## 2019-03-08 LAB — URINALYSIS, COMPLETE (UACMP) WITH MICROSCOPIC
Bilirubin Urine: NEGATIVE
Glucose, UA: NEGATIVE mg/dL
Ketones, ur: NEGATIVE mg/dL
Nitrite: NEGATIVE
Protein, ur: 30 mg/dL — AB
RBC / HPF: >50 RBC/hpf — ABNORMAL HIGH (ref 0–5)
Specific Gravity, Urine: 1.016 (ref 1.005–1.030)
WBC, UA: >50 WBC/hpf — ABNORMAL HIGH (ref 0–5)
pH: 6 (ref 5.0–8.0)

## 2019-03-08 LAB — TROPONIN I (HIGH SENSITIVITY): Troponin I (High Sensitivity): 10 ng/L (ref <18)

## 2019-03-08 LAB — SARS CORONAVIRUS 2 BY RT PCR (HOSPITAL ORDER, PERFORMED IN ~~LOC~~ HOSPITAL LAB): SARS Coronavirus 2: NEGATIVE

## 2019-03-08 MED ORDER — SODIUM CHLORIDE 0.9 % IV SOLN
1.0000 g | Freq: Once | INTRAVENOUS | Status: AC
  Administered 2019-03-08: 1 g via INTRAVENOUS
  Filled 2019-03-08: qty 10

## 2019-03-08 MED ORDER — ACETAMINOPHEN 500 MG PO TABS: 1000.0000 mg | ORAL_TABLET | Freq: Four times a day (QID) | ORAL | Status: DC | PRN

## 2019-03-08 MED ORDER — APIXABAN 2.5 MG PO TABS
2.5000 mg | ORAL_TABLET | Freq: Two times a day (BID) | ORAL | Status: DC
  Administered 2019-03-08: 2.5 mg via ORAL
  Filled 2019-03-08 (×3): qty 1

## 2019-03-08 MED ORDER — APIXABAN 5 MG PO TABS: 5.0000 mg | ORAL_TABLET | Freq: Two times a day (BID) | ORAL | Status: DC

## 2019-03-08 MED ORDER — PANTOPRAZOLE SODIUM 40 MG PO TBEC
40.0000 mg | DELAYED_RELEASE_TABLET | Freq: Every day | ORAL | Status: DC
  Administered 2019-03-08 – 2019-03-10 (×3): 40 mg via ORAL
  Filled 2019-03-08 (×3): qty 1

## 2019-03-08 MED ORDER — ROSUVASTATIN CALCIUM 20 MG PO TABS
40.0000 mg | ORAL_TABLET | Freq: Every day | ORAL | Status: DC
  Administered 2019-03-08 – 2019-03-10 (×3): 40 mg via ORAL
  Filled 2019-03-08: qty 4
  Filled 2019-03-08 (×2): qty 2
  Filled 2019-03-08: qty 4
  Filled 2019-03-08: qty 2

## 2019-03-08 MED ORDER — SODIUM CHLORIDE 0.9 % IV SOLN
INTRAVENOUS | Status: DC
  Administered 2019-03-08 – 2019-03-09 (×2): via INTRAVENOUS

## 2019-03-08 MED ORDER — TAMSULOSIN HCL 0.4 MG PO CAPS
0.4000 mg | ORAL_CAPSULE | Freq: Every day | ORAL | Status: DC
  Administered 2019-03-08 – 2019-03-09 (×2): 0.4 mg via ORAL
  Filled 2019-03-08 (×2): qty 1

## 2019-03-08 MED ORDER — AMIODARONE HCL 200 MG PO TABS
200.0000 mg | ORAL_TABLET | Freq: Every day | ORAL | Status: DC
  Administered 2019-03-08 – 2019-03-10 (×3): 200 mg via ORAL
  Filled 2019-03-08 (×3): qty 1

## 2019-03-08 MED ORDER — IOHEXOL 240 MG/ML SOLN: 25.0000 mL | Freq: Once | INTRAMUSCULAR | Status: DC | PRN

## 2019-03-08 MED ORDER — SODIUM CHLORIDE 0.9 % IV SOLN
1.0000 g | INTRAVENOUS | Status: DC
  Administered 2019-03-09: 1 g via INTRAVENOUS
  Filled 2019-03-08: qty 1
  Filled 2019-03-08: qty 10

## 2019-03-08 NOTE — Telephone Encounter (Signed)
I have reached out to [facility] and will attempt to reschedule patient once feedback is received.

## 2019-03-08 NOTE — Telephone Encounter (Signed)
Will follow as appropriate. 

## 2019-03-08 NOTE — H&P (Addendum)
[facility] NAME: Carl [name]    MR#:  [id number]  DATE OF BIRTH:  12/16/1935  DATE OF ADMISSION:  03/08/2019  PRIMARY CARE PHYSICIAN: [name], MD   REQUESTING/REFERRING PHYSICIAN: Lavonia Drafts, MD  CHIEF COMPLAINT:   Chief Complaint  Patient presents with  . Fall  . Weakness    HISTORY OF PRESENT ILLNESS:   Carl [name] is a 83 year old male with past medical history of heart failure with preserved ejection fraction, AAA, chronic anemia, CKD stage III, CAD status post CABG, paroxysmal A. fib, pulmonary hypertension, and prostate cancer presenting to the ED with altered mental status.  Patient is altered and unable to provide history so history mostly obtained from patient's chart.  Per ED reports, patient was brought in by EMS for a fall and generalized weakness.  Patient's daughter called in to his PCP on 03/07/2019 due to concerns of patient having hallucinations and worsening confusion since discharge.  He was advised to go to the ED by his PCP however he did not show up until today.  In reviewing his chart, patient was recently discharged on 03/02/2019 following admission altered mental status secondary to UTI.  He was treated with IV Rocephin which was transitioned to p.o.  Urine culture during that admission showed yeast therefore patient was continued on Augmentin for a few months days.  On arrival to the ED, he was afebrile with blood pressure 137/71 mm Hg and pulse rate 71 beats/min. There were no focal neurological deficits; he was alert but disoriented x3.  Initial labs revealed WBC 16.2, creatinine 1.63, UA positive for UTI.  Chest x-ray showed no acute abnormality.  A noncontrast CT head was obtained and showed no acute intracranial abnormality.  X-ray of the lumbar spine showed stable old L1 compression fracture otherwise no acute abnormality.  Due to recurrent UTI and altered mental status hospitalist  were contacted to admit for further evaluation and management.  PAST MEDICAL HISTORY:   Past Medical History:  Diagnosis Date  . (HFpEF) heart failure with preserved ejection fraction ([place])    a. 08/2016 Echo: EF 50-55%.  [name] AAA (abdominal aortic aneurysm) ([place])    CT abd. dated 06-18-2018 in epic,  3.1cm;  followed by pcp  . Arthritis    arms  . Bladder calculus   . Chronic anemia   . Chronic anxiety   . CKD (chronic kidney disease), stage III ([place])   . Coronary artery disease CARDIOLOGIST-  DR Rockey Situ   a. CABG 2006 with  LIMA-LAD, SVG-Ramus, SVG-OM, seq SVG-PLV-PDA.  . DOE (dyspnea on exertion)   . GERD (gastroesophageal reflux disease)   . Hematuria   . History of bladder stone   . History of non-ST elevation myocardial infarction (NSTEMI) 03/30/2005   post op recovery inguinal hernia repair  . History of recurrent UTIs    last admission 08-04-2018 pseudomonas UTI  . Hyperlipidemia   . Hyperplasia of prostate without lower urinary tract symptoms (LUTS)   . Hypertension   . Ischemic cardiomyopathy    a. echo (01/13) 45-50% septal akinesis, mild MR; b. Echo  (01/16) ef 60-65%, mid-apical anteroseptal hypokinesis; c. echo 09-16-2016 ef 50-55%  . Left ureteral stone   . Lung nodule    noted 08/ 2018;  followed by pcp  . Moderate mitral regurgitation    a.08/2016 Echo: Mod MR/TR.  [name] Orthostatic hypotension   . PAC (premature atrial contraction)   . Paroxysmal atrial  flutter ([place])    a. dx 08/2016, started on amiodarone.  . Persistent atrial fibrillation followed by cardiologist-- dr Rockey Situ   a. dx AFib 08/2016 s/p DCCV 10/2016-->eliquis (CHA2DS2VASc = 5).  . Pulmonary hypertension ([place])    last echo in epic 09-16-2016  . Recurrent prostate cancer ([place]) UROLOGIST-- DR BELL   s/p  radioactive prostate seed implants in 1997;    due to PSA rising pt has been doing lupron injection's few past several yrs at dr bell office  . Renal calculi    bilateral per CT 06-18-2018  . S/P  CABG x 5 04/04/2005   LIMA to LAD,  SVG to Ramus,  SVG to OM,  seqSVG to PLV and PDA  . Wears glasses     PAST SURGICAL HISTORY:   Past Surgical History:  Procedure Laterality Date  . CARDIAC CATHETERIZATION  04/02/2005   dr Doreatha Lew    Critical three-vessel coronary disease --  Essentially normal left ventricular function , ef 55%  . CARDIOVASCULAR STRESS TEST  05-01-2008  dr Doreatha Lew /  dr harding   Low risk nuclear study w/ no ischemia or scar but flattened septal motion/  ef 64%  . CARDIOVERSION N/A 11/19/2016   Procedure: CARDIOVERSION;  Surgeon: Jerline Pain, MD;  Location: [facility];  Service: Cardiovascular;  Laterality: N/A;  . COLONOSCOPY N/A 10/23/2016   Procedure: COLONOSCOPY;  Surgeon: Milus Banister, MD;  Location: [facility];  Service: Endoscopy;  Laterality: N/A;  . CORONARY ARTERY BYPASS GRAFT  04/04/2005   dr  Ricard Dillon   LIMA - LAD,  SVG - Ramus, SVG - OM,  seqSVG - PLV and PDA  . CYSTOSCOPY WITH LITHOLAPAXY N/A 05/07/2016   Procedure: CYSTOSCOPY WITH LITHOLAPAXY;  Surgeon: Rana Snare, MD;  Location[facility];  Service: Urology;  Laterality: N/A;  . CYSTOSCOPY WITH RETROGRADE PYELOGRAM, URETEROSCOPY AND STENT PLACEMENT  09/03/2018   Procedure: CYSTOSCOPY WITH RETROGRADE PYELOGRAM, URETEROSCOPY LASER LITHOTRIPSY AND STENT PLACEMENT;  Surgeon: Lucas Mallow, MD;  Location: [facility];  Service: Urology;;  . HOLMIUM LASER APPLICATION N/A 45/36/4680   Procedure: HOLMIUM LASER APPLICATION;  Surgeon: Rana Snare, MD;  Location: [facility];  Service: Urology;  Laterality: N/A;  . INGUINAL HERNIA REPAIR Left 03-30-2005  dr Excell Seltzer   incarcerated   . RADIOACTIVE PROSTATE SEED IMPLANTS  1997  . TOTAL HIP ARTHROPLASTY Right 10/07/2018   Procedure: TOTAL HIP ARTHROPLASTY ANTERIOR APPROACH;  Surgeon: Rod Can, MD;  Location: [facility];  Service: Orthopedics;  Laterality: Right;  . TRANSTHORACIC ECHOCARDIOGRAM  08/24/2014   mid anteroseptal  and distal septak hypokinetic,  mild focal basal LVH, ef 60-65%,  grade 1 diastolic dysfunction/  mild MR/  mild LAE/  mild dilated RV with normal RVSP/  trivial TR    SOCIAL HISTORY:   Social History   Tobacco Use  . Smoking status: Former Smoker    Years: 2.00    Types: Cigarettes    Quit date: 07/28/1960    Years since quitting: 58.6  . Smokeless tobacco: Never Used  Substance Use Topics  . Alcohol use: No    FAMILY HISTORY:   Family History  Problem Relation Age of Onset  . Heart disease Mother   . Cancer Mother   . Cancer Father   . Cancer Brother     DRUG ALLERGIES:   Allergies  Allergen Reactions  . Ciprofloxacin Other (See Comments)    PER HEART DR    REVIEW OF SYSTEMS:  ROS Unable to obtain due to altered mental status MEDICATIONS AT HOME:   Prior to Admission medications   Medication Sig Start Date End Date Taking? Authorizing Provider  amiodarone (PACERONE) 200 MG tablet Take 1 tablet (200 mg total) by mouth daily. 07/12/18  Yes Dunn, Areta Haber, PA-C  apixaban (ELIQUIS) 5 MG TABS tablet Take 1 tablet (5 mg total) by mouth 2 (two) times daily. 12/16/18  Yes [name], NP  omeprazole (PRILOSEC) 40 MG capsule Take 1 capsule (40 mg total) by mouth daily. 02/02/19  Yes [name], MD  rosuvastatin (CRESTOR) 40 MG tablet Take 1 tablet (40 mg total) by mouth daily. 06/18/18  Yes [name], [name], MD  acetaminophen (TYLENOL) 500 MG tablet Take 1,000 mg by mouth every 6 (six) hours as needed for mild pain (for pain).     [provider]  senna-docusate (SENOKOT-S) 8.6-50 MG tablet Take 1 tablet by mouth 2 (two) times daily. Patient not taking: Reported on 03/08/2019 08/09/18   Eugenie Filler, MD  tamsulosin (FLOMAX) 0.4 MG CAPS capsule Take 1 capsule (0.4 mg total) by mouth daily after supper. 01/06/18   [name], MD      VITAL SIGNS:  Blood pressure (!) 162/82, pulse 74, temperature 98.8 F (37.1 C), temperature source  Oral, resp. rate 16, height 5\' 8"  (1.727 m), weight 71 kg, SpO2 95 %.  PHYSICAL EXAMINATION:   Physical Exam  GENERAL:83 y.o.-year-oldmalepatient lying in the bed with no acute distress.  EYES: Pupils equal, round, reactive to light and accommodation. No scleral icterus. Extraocular muscles intact.  HEENT: Head atraumatic, normocephalic. Oropharynx and nasopharynx clear.  NECK: Supple, no jugular venous distention. No thyroid enlargement, no tenderness.  LUNGS: Normal breath sounds bilaterally, no wheezing, rales,rhonchi or crepitation. No use of accessory muscles of respiration.  CARDIOVASCULAR: Regular rate and rhythm, S1, S2 normal. No murmurs, rubs, or gallops.  ABDOMEN: Soft, nondistended, nontender. Bowel sounds present. No organomegaly or mass.  EXTREMITIES: No pedal edema, cyanosis, or clubbing.  NEUROLOGIC: Cranial nerves II through XII are intact. Muscle strength 5/5 in all extremities. Sensation intact. Gait unsteady when observed walking to the toilet. PSYCHIATRIC: The patient is alert and oriented x 1. Normal affect and good eye contact. SKIN: No obvious rash, lesion, or ulcer.   DATA REVIEWED:  LABORATORY PANEL:   CBC Recent Labs  Lab 03/08/19 0822  WBC 16.2*  HGB 10.5*  HCT 34.4*  PLT 249   ------------------------------------------------------------------------------------------------------------------  Chemistries  Recent Labs  Lab 03/08/19 0822  NA 140  K 3.8  CL 108  CO2 22  GLUCOSE 105*  BUN 19  CREATININE 1.63*  CALCIUM 8.8*  AST 19  ALT 20  ALKPHOS 161*  BILITOT 0.4   ------------------------------------------------------------------------------------------------------------------  Cardiac Enzymes No results for input(s): TROPONINI in the last 168 hours. ------------------------------------------------------------------------------------------------------------------  RADIOLOGY:  Ct Abdomen Pelvis Wo Contrast  Result Date:  03/08/2019 CLINICAL DATA:  Recurrent urinary tract infection. EXAM: CT ABDOMEN AND PELVIS WITHOUT CONTRAST TECHNIQUE: Multidetector CT imaging of the abdomen and pelvis was performed following the standard protocol without IV contrast. COMPARISON:  CT scan of January 05, 2018. FINDINGS: Lower chest: No acute abnormality. Hepatobiliary: Cholelithiasis is noted without inflammation. No definite biliary dilatation is noted. No hepatic abnormality is seen on these unenhanced images. Pancreas: Unremarkable. No pancreatic ductal dilatation or surrounding inflammatory changes. Spleen: Calcified splenic granulomata are noted. Adrenals/Urinary Tract: Adrenal glands appear normal. Bilateral renal cysts are noted, with the largest involving the upper pole  of the left kidney. Stable staghorn type calculi are seen involving the upper lobe collecting system of the left kidney. There is associated cortical thinning or scarring in this area. Stable right nephrolithiasis is noted. No hydronephrosis or renal obstruction is noted. Urinary bladder is unremarkable. Stomach/Bowel: Small sliding-type hiatal hernia is noted. Otherwise stomach is unremarkable. There is no evidence of bowel obstruction or inflammation. The appendix appears normal. Vascular/Lymphatic: 3.3 cm infrarenal abdominal aortic aneurysm is noted. Atherosclerosis of abdominal aorta is noted. No significant adenopathy is noted. Reproductive: Status post prostatic brachytherapy seed placement. Other: No abdominal wall hernia or abnormality. No abdominopelvic ascites. Musculoskeletal: No acute or significant osseous findings. IMPRESSION: 3.3 cm infrarenal abdominal aortic aneurysm. Recommend followup by ultrasound in 3 years. This recommendation follows ACR consensus guidelines: White Paper of the ACR Incidental Findings Committee II on Vascular Findings. J Am Coll Radiol 2013; 10:789-794. Cholelithiasis. Stable bilateral nephrolithiasis is noted without hydronephrosis or  renal obstruction is noted. Stable cortical thinning and or scarring is seen involving upper pole of right kidney. Small sliding-type hiatal hernia. Aortic Atherosclerosis (ICD10-I70.0). Electronically Signed   By: Marijo Conception M.D.   On: 03/08/2019 12:48   Dg Lumbar Spine 2-3 Views  Result Date: 03/08/2019 CLINICAL DATA:  Low back pain after fall today. EXAM: LUMBAR SPINE - 2-3 VIEW COMPARISON:  Radiographs of March 27, 2017. FINDINGS: Stable old L1 compression fracture is noted. No acute fracture or spondylolisthesis is noted. Mild degenerative disc disease is noted at L2-3 and L4-5. Moderate degenerative disc disease is noted at L3-4. Severe degenerative disc disease is noted at L5-S1. Atherosclerosis of thoracic aorta is noted. IMPRESSION: Stable old L1 compression fracture. Multilevel degenerative disc disease. No acute abnormality seen in the lumbar spine. Aortic Atherosclerosis (ICD10-I70.0). Electronically Signed   By: Marijo Conception M.D.   On: 03/08/2019 09:22   Dg Pelvis 1-2 Views  Result Date: 03/08/2019 CLINICAL DATA:  Pelvic pain after fall today. EXAM: PELVIS - 1-2 VIEW COMPARISON:  Radiograph October 07, 2018. FINDINGS: Status post right total hip arthroplasty. No fracture or dislocation is noted. Left hip is unremarkable. Status post prostatic brachytherapy seed placement. IMPRESSION: No acute abnormality seen in the pelvis. Electronically Signed   By: Marijo Conception M.D.   On: 03/08/2019 09:18   Ct Head Wo Contrast  Result Date: 03/08/2019 CLINICAL DATA:  Altered mental status, multiple falls EXAM: CT HEAD WITHOUT CONTRAST TECHNIQUE: Contiguous axial images were obtained from the base of the skull through the vertex without intravenous contrast. COMPARISON:  03/03/2019 FINDINGS: Brain: No evidence of acute infarction, hemorrhage, hydrocephalus, or extra-axial collection. Stable extra-axial left frontal mass with internal calcifications measuring approximately 20 x 10 mm, most  consistent with meningioma. Scattered low-density changes within the periventricular and subcortical white matter suggesting chronic microvascular ischemic changes. Vascular: Mild atherosclerotic calcifications involving the large vessels of the skull base. No unexpected hyperdense vessel. Skull: No acute calvarial fracture or bone lesion. Sinuses/Orbits: Partial mucosal opacification of the frontal sinus and left ethmoid air cells. Remaining paranasal sinuses and mastoid air cells are clear. Orbital structures are intact. Other: None. IMPRESSION: 1. No acute intracranial findings. 2. Scattered chronic microvascular ischemic changes. 3. Stable small left frontal extra-axial mass, most likely representing meningioma. Electronically Signed   By: Davina Poke M.D.   On: 03/08/2019 09:01   Dg Chest Port 1 View  Result Date: 03/08/2019 CLINICAL DATA:  Weakness. EXAM: PORTABLE CHEST 1 VIEW COMPARISON:  Radiographs of February 25, 2019. FINDINGS:  The heart size and mediastinal contours are within normal limits. Both lungs are clear. Status post coronary artery bypass graft. Atherosclerosis of thoracic aorta is noted. No pneumothorax or pleural effusion is noted. The visualized skeletal structures are unremarkable. IMPRESSION: No active disease. Aortic Atherosclerosis (ICD10-I70.0). Electronically Signed   By: Marijo Conception M.D.   On: 03/08/2019 09:19    EKG:  EKG: unchanged from previous tracings, there are no previous tracings available for comparison.  IMPRESSION AND PLAN:   83 y.o. male Carl [name] is a 83 year old male with past medical history of heart failure with preserved ejection fraction, AAA, chronic anemia, CKD stage III, CAD status post CABG, paroxysmal A. fib, pulmonary hypertension, and prostate cancer presenting to the ED with altered mental status.  1. Altered mental status -likely secondary to UTI. - Admit to MedSurg unit - Chest x-ray shows no active cardiopulmonary process - CT  head negative for acute intracranial abnormality - Blood cultures pending  2.  Recurrent UTI -recent admission on 03/02/19 with altered mental status and found to have UTI - UA positive for UTI - Urine cultures pending - Urine cultures from 03/03/19 showed colonies with yeast treated with IV Rocephin and oral Augmentin at discharge. Unclear if he completed the dose since he lives by himself - Will start IV ceftriaxone - Obtain CT abdomen and pelvis - ID consult due to recurrent infection with candida  3.  Acute on chronic CKD -BUN/creatinine slightly elevated -Avoid nephrotoxins -Continue to monitor renal function  4.  Paroxysmal atrial fibrillation -rate controlled on amiodarone -Continue anticoagulation with Eliquis  5. HLD  + Goal LDL<100 - Rosuvastatin 40mg  PO qhs  6. Debility/generalized weakness - PT consult - Social work consult  All the records are reviewed and case discussed with ED provider. Management plans discussed with the patient, family and they are in agreement.  CODE STATUS: FULL  TOTAL TIME TAKING CARE OF THIS PATIENT: 50 minutes.    on 03/08/2019 at 3:52 PM  Rufina Falco, DNP, FNP-BC [facility] Nurse Practitioner Between 7am to 6pm - Pager [phone]  After 6pm go to [email or web address] - password [email or web address]  Office  [phone]  CC: Primary care physician; [name], MD

## 2019-03-08 NOTE — ED Provider Notes (Signed)
[facility] Emergency Department Provider Note   ____________________________________________    I have reviewed the triage vital signs and the nursing notes.   HISTORY  Chief Complaint Fall and Weakness   Patient very poor historian  HPI Carl [name] is a 83 y.o. male who apparently was brought from home by EMS for a fall and generalized weakness.  There is mention that he is also being treated for a urinary tract infection.  Patient complains of some pain in his lower back.  He is unable to give significant other history.   Past Medical History:  Diagnosis Date  . (HFpEF) heart failure with preserved ejection fraction ([place])    a. 08/2016 Echo: EF 50-55%.  [name] AAA (abdominal aortic aneurysm) ([place])    CT abd. dated 06-18-2018 in epic,  3.1cm;  followed by pcp  . Arthritis    arms  . Bladder calculus   . Chronic anemia   . Chronic anxiety   . CKD (chronic kidney disease), stage III ([place])   . Coronary artery disease CARDIOLOGIST-  DR Rockey Situ   a. CABG 2006 with  LIMA-LAD, SVG-Ramus, SVG-OM, seq SVG-PLV-PDA.  . DOE (dyspnea on exertion)   . GERD (gastroesophageal reflux disease)   . Hematuria   . History of bladder stone   . History of non-ST elevation myocardial infarction (NSTEMI) 03/30/2005   post op recovery inguinal hernia repair  . History of recurrent UTIs    last admission 08-04-2018 pseudomonas UTI  . Hyperlipidemia   . Hyperplasia of prostate without lower urinary tract symptoms (LUTS)   . Hypertension   . Ischemic cardiomyopathy    a. echo (01/13) 45-50% septal akinesis, mild MR; b. Echo  (01/16) ef 60-65%, mid-apical anteroseptal hypokinesis; c. echo 09-16-2016 ef 50-55%  . Left ureteral stone   . Lung nodule    noted 08/ 2018;  followed by pcp  . Moderate mitral regurgitation    a.08/2016 Echo: Mod MR/TR.  [name] Orthostatic hypotension   . PAC (premature atrial contraction)   . Paroxysmal atrial flutter ([place])    a. dx  08/2016, started on amiodarone.  . Persistent atrial fibrillation followed by cardiologist-- dr Rockey Situ   a. dx AFib 08/2016 s/p DCCV 10/2016-->eliquis (CHA2DS2VASc = 5).  . Pulmonary hypertension ([place])    last echo in epic 09-16-2016  . Recurrent prostate cancer ([place]) UROLOGIST-- DR BELL   s/p  radioactive prostate seed implants in 1997;    due to PSA rising pt has been doing lupron injection's few past several yrs at dr bell office  . Renal calculi    bilateral per CT 06-18-2018  . S/P CABG x 5 04/04/2005   LIMA to LAD,  SVG to Ramus,  SVG to OM,  seqSVG to PLV and PDA  . Wears glasses     Patient Active Problem List   Diagnosis Date Noted  . Acute lower UTI 03/02/2019  . AMS (altered mental status) 03/02/2019  . Displaced fracture of right femoral neck ([place]) 10/07/2018  . Hip fracture ([place]) 10/05/2018  . Intracranial hematoma ([place]) 10/05/2018  . AAA (abdominal aortic aneurysm) ([place]) 08/26/2018  . Pseudomonas aeruginosa infection 08/06/2018  . Acute cystitis with hematuria   . Pseudomonas urinary tract infection 08/05/2018  . Hematuria 08/05/2018  . Pressure injury of skin 08/05/2018  . Kidney stones 06/30/2018  . Allergic rhinitis 04/26/2018  . Skin tear of elbow without complication 70/26/3785  . Complicated UTI (urinary tract infection) 01/05/2018  . Urolithiasis  01/05/2018  . Frequent urination 01/01/2018  . Colon polyps 09/15/2017  . GERD (gastroesophageal reflux disease) 09/15/2017  . Wrist pain, acute, right 05/25/2017  . Low back pain 03/24/2017  . Lung nodule seen on imaging study: 7 mm nodule left midlung 03/13/2017  . Physical deconditioning 03/13/2017  . Normocytic anemia 03/13/2017  . Fall 03/12/2017  . Abdominal soreness 03/12/2017  . Generalized weakness 03/12/2017  . Leukocytosis 03/12/2017  . Hyponatremia 03/12/2017  . Orthostatic hypotension 02/16/2017  . Blood in stool 09/19/2016  . Anticoagulated 09/19/2016  . History of prostate cancer 09/04/2016   . Persistent atrial fibrillation 09/04/2016  . Acute congestive heart failure ([place]) 09/04/2016  . Cardiomyopathy, ischemic 07/16/2012  . Chronic kidney disease (CKD), stage III (moderate) ([place]) 07/16/2012  . Essential hypertension 08/05/2011  . Murmur 08/05/2011  . 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 Dyslipidemia 02/04/2011    Past Surgical History:  Procedure Laterality Date  . CARDIAC CATHETERIZATION  04/02/2005   dr Doreatha Lew    Critical three-vessel coronary disease --  Essentially normal left ventricular function , ef 55%  . CARDIOVASCULAR STRESS TEST  05-01-2008  dr Doreatha Lew /  dr harding   Low risk nuclear study w/ no ischemia or scar but flattened septal motion/  ef 64%  . CARDIOVERSION N/A 11/19/2016   Procedure: CARDIOVERSION;  Surgeon: Jerline Pain, MD;  Location: [place];  Service: Cardiovascular;  Laterality: N/A;  . COLONOSCOPY N/A 10/23/2016   Procedure: COLONOSCOPY;  Surgeon: Milus Banister, MD;  Location: [facility];  Service: Endoscopy;  Laterality: N/A;  . CORONARY ARTERY BYPASS GRAFT  04/04/2005   dr  Ricard Dillon   LIMA - LAD,  SVG - Ramus, SVG - OM,  seqSVG - PLV and PDA  . CYSTOSCOPY WITH LITHOLAPAXY N/A 05/07/2016   Procedure: CYSTOSCOPY WITH LITHOLAPAXY;  Surgeon: Rana Snare, MD;  Location[facility];  Service: Urology;  Laterality: N/A;  . CYSTOSCOPY WITH RETROGRADE PYELOGRAM, URETEROSCOPY AND STENT PLACEMENT  09/03/2018   Procedure: CYSTOSCOPY WITH RETROGRADE PYELOGRAM, URETEROSCOPY LASER LITHOTRIPSY AND STENT PLACEMENT;  Surgeon: Lucas Mallow, MD;  Location: [facility];  Service: Urology;;  . HOLMIUM LASER APPLICATION N/A 93/81/0175   Procedure: HOLMIUM LASER APPLICATION;  Surgeon: Rana Snare, MD;  [facility];  Service: Urology;  Laterality: N/A;  . INGUINAL HERNIA REPAIR Left 03-30-2005  dr Excell Seltzer   incarcerated   . RADIOACTIVE PROSTATE SEED IMPLANTS  1997  . TOTAL HIP ARTHROPLASTY Right 10/07/2018   Procedure:  TOTAL HIP ARTHROPLASTY ANTERIOR APPROACH;  Surgeon: Rod Can, MD;  Location: [facility];  Service: Orthopedics;  Laterality: Right;  . TRANSTHORACIC ECHOCARDIOGRAM  08/24/2014   mid anteroseptal and distal septak hypokinetic,  mild focal basal LVH, ef 60-65%,  grade 1 diastolic dysfunction/  mild MR/  mild LAE/  mild dilated RV with normal RVSP/  trivial TR    Prior to Admission medications   Medication Sig Start Date End Date Taking? Authorizing Provider  amiodarone (PACERONE) 200 MG tablet Take 1 tablet (200 mg total) by mouth daily. 07/12/18  Yes Dunn, Areta Haber, PA-C  apixaban (ELIQUIS) 5 MG TABS tablet Take 1 tablet (5 mg total) by mouth 2 (two) times daily. 12/16/18  Yes [name], NP  omeprazole (PRILOSEC) 40 MG capsule Take 1 capsule (40 mg total) by mouth daily. 02/02/19  Yes [name], MD  rosuvastatin (CRESTOR) 40 MG tablet Take 1 tablet (40 mg total) by mouth daily. 06/18/18  Yes [name], [name], MD  acetaminophen (TYLENOL) 500 MG tablet Take 1,000 mg by mouth every 6 (six) hours as needed for mild pain (for pain).     [provider]  senna-docusate (SENOKOT-S) 8.6-50 MG tablet Take 1 tablet by mouth 2 (two) times daily. Patient not taking: Reported on 03/08/2019 08/09/18   Eugenie Filler, MD  tamsulosin (FLOMAX) 0.4 MG CAPS capsule Take 1 capsule (0.4 mg total) by mouth daily after supper. 01/06/18   [name], MD     Allergies Ciprofloxacin  Family History  Problem Relation Age of Onset  . Heart disease Mother   . Cancer Mother   . Cancer Father   . Cancer Brother     Social History Social History   Tobacco Use  . Smoking status: Former Smoker    Years: 2.00    Types: Cigarettes    Quit date: 07/28/1960    Years since quitting: 58.6  . Smokeless tobacco: Never Used  Substance Use Topics  . Alcohol use: No  . Drug use: No    Review of Systems limited by mental status  Constitutional: No reported fevers  Cardiovascular:  Denies chest pain Respiratory: No cough Gastrointestinal: No apparent abdominal pain Genitourinary: Reportedly being treated for urinary tract infection Musculoskeletal: As above Skin: No laceration or rash or abrasion Neurological: Generalized weakness   ____________________________________________   PHYSICAL EXAM:  VITAL SIGNS: ED Triage Vitals  Enc Vitals Group     BP 03/08/19 0756 123/76     Pulse Rate 03/08/19 0756 83     Resp 03/08/19 0756 16     Temp 03/08/19 0756 98.8 F (37.1 C)     Temp Source 03/08/19 0756 Oral     SpO2 03/08/19 0756 98 %     Weight 03/08/19 0757 71 kg (156 lb 8.4 oz)     Height 03/08/19 0757 1.727 m (5\' 8" )     Head Circumference --      Peak Flow --      Pain Score 03/08/19 0756 8     Pain Loc --      Pain Edu? --      Excl. in [place]? --     Constitutional: Alert Eyes: Conjunctivae are normal.  Head: Atraumatic. Nose: No congestion/rhinnorhea. Mouth/Throat: Mucous membranes are moist.   Neck: No vertebral tenderness palpation, no pain with axial load Cardiovascular: Normal rate, regular rhythm. Good peripheral circulation.  No chest wall tenderness palpation, no clavicular tenderness to palpation Respiratory: Normal respiratory effort.  No retractions.  Gastrointestinal: Soft and nontender. No distention.    Musculoskeletal: No vertebral tenderness palpation, moves all extremities, no pain with axial load on the lower extremities Neurologic:  Normal speech and language.  Skin:  Skin is warm, dry and intact.   ____________________________________________   LABS (all labs ordered are listed, but only abnormal results are displayed)  Labs Reviewed  CBC WITH DIFFERENTIAL/PLATELET - Abnormal; Notable for the following components:      Result Value   WBC 16.2 (*)    RBC 3.58 (*)    Hemoglobin 10.5 (*)    HCT 34.4 (*)    RDW 15.7 (*)    Neutro Abs 14.9 (*)    Lymphs Abs 0.5 (*)    All other components within normal limits   COMPREHENSIVE METABOLIC PANEL - Abnormal; Notable for the following components:   Glucose, Bld 105 (*)    Creatinine, Ser 1.63 (*)    Calcium 8.8 (*)  Total Protein 6.4 (*)    Albumin 3.1 (*)    Alkaline Phosphatase 161 (*)    GFR calc non Af Amer 39 (*)    GFR calc Af Amer 45 (*)    All other components within normal limits  URINALYSIS, COMPLETE (UACMP) WITH MICROSCOPIC - Abnormal; Notable for the following components:   Color, Urine YELLOW (*)    APPearance CLOUDY (*)    Hgb urine dipstick MODERATE (*)    Protein, ur 30 (*)    Leukocytes,Ua LARGE (*)    RBC / HPF >50 (*)    WBC, UA >50 (*)    Bacteria, UA RARE (*)    All other components within normal limits  SARS CORONAVIRUS 2 (HOSPITAL ORDER, [place] [facility])  URINE CULTURE  TROPONIN I (HIGH SENSITIVITY)   ____________________________________________  EKG  None ____________________________________________  RADIOLOGY  CT head unremarkable No acute fractures on chest x-ray pelvis or lumbar spine ____________________________________________   PROCEDURES  Procedure(s) performed: No  Procedures   Critical Care performed: No ____________________________________________   INITIAL IMPRESSION / ASSESSMENT AND PLAN / ED COURSE  Pertinent labs & imaging results that were available during my care of the patient were reviewed by me and considered in my medical decision making (see chart for details).  Patient with confusion, generalized weakness.  Review of records demonstrates that this is likely not particularly new for him.  Reported fall does complain of some mild back pain.  X-rays reassuring, CT head unremarkable.  Lab work significant for elevated white blood cell count, urinalysis concerning for urinary tract infection, will cover with IV Rocephin will require admission for further work-up, coronavirus test is negative.    ____________________________________________   FINAL  CLINICAL IMPRESSION(S) / ED DIAGNOSES  Final diagnoses:  Metabolic encephalopathy  Lower urinary tract infectious disease  Generalized weakness  Fall, initial encounter        Note:  This document was prepared using Dragon voice recognition software and may include unintentional dictation errors.   Lavonia Drafts, MD 03/08/19 1018

## 2019-03-08 NOTE — ED Notes (Signed)
ED TO INPATIENT HANDOFF REPORT  ED Nurse Name and Phone #:  Stephens Shire [phone]   S Name/Age/Gender Carl [name] 83 y.o. male Room/Bed: ED25A/ED25A  Code Status   Code Status: Full Code  Home/SNF/Other Home Patient oriented to: self, place, time and situation Is this baseline? Family has been reporting increased confusion and pt has attempted to get out of bed and is reaching in the air for things that are not there. PT answers all orientation questions appropriately at this time.   Triage Complete: Triage complete  Chief Complaint fall ems  Triage Note Patient from home via [facility]. Per EMS, patient fell this morning but denies injury from fall. When asked by this RN, patient denies falling this morning but states he fell a few days ago because he last his balance. Denies hitting head or LOC. Patient reports increased weakness for the last 2 weeks. Patient lives by himself, however family reports patient has been increasingly confused over the last month. Patient recently finished abx for UTI.    Allergies Allergies  Allergen Reactions  . Ciprofloxacin Other (See Comments)    PER HEART DR    Level of Care/Admitting Diagnosis ED Disposition    ED Disposition Condition Comment   Admit  Hospital Area: [place] [[id number]]  Level of Care: Med-Surg [16]  Covid Evaluation: Confirmed COVID Negative  Diagnosis: Altered mental status [780.97.ICD-9-CM]  Admitting Physician: Rufina Falco [place]  Attending Physician: Rufina Falco ACHIENG [[id number]]  Estimated length of stay: past midnight tomorrow  Certification:: I certify this patient will need inpatient services for at least 2 midnights  PT Class (Do Not Modify): Inpatient [101]  PT Acc Code (Do Not Modify): Private [1]       B Medical/Surgery History Past Medical History:  Diagnosis Date  . (HFpEF) heart failure with preserved ejection fraction ([place])    a. 08/2016  Echo: EF 50-55%.  [name] AAA (abdominal aortic aneurysm) ([place])    CT abd. dated 06-18-2018 in epic,  3.1cm;  followed by pcp  . Arthritis    arms  . Bladder calculus   . Chronic anemia   . Chronic anxiety   . CKD (chronic kidney disease), stage III ([place])   . Coronary artery disease CARDIOLOGIST-  DR Rockey Situ   a. CABG 2006 with  LIMA-LAD, SVG-Ramus, SVG-OM, seq SVG-PLV-PDA.  . DOE (dyspnea on exertion)   . GERD (gastroesophageal reflux disease)   . Hematuria   . History of bladder stone   . History of non-ST elevation myocardial infarction (NSTEMI) 03/30/2005   post op recovery inguinal hernia repair  . History of recurrent UTIs    last admission 08-04-2018 pseudomonas UTI  . Hyperlipidemia   . Hyperplasia of prostate without lower urinary tract symptoms (LUTS)   . Hypertension   . Ischemic cardiomyopathy    a. echo (01/13) 45-50% septal akinesis, mild MR; b. Echo  (01/16) ef 60-65%, mid-apical anteroseptal hypokinesis; c. echo 09-16-2016 ef 50-55%  . Left ureteral stone   . Lung nodule    noted 08/ 2018;  followed by pcp  . Moderate mitral regurgitation    a.08/2016 Echo: Mod MR/TR.  [name] Orthostatic hypotension   . PAC (premature atrial contraction)   . Paroxysmal atrial flutter ([place])    a. dx 08/2016, started on amiodarone.  . Persistent atrial fibrillation followed by cardiologist-- dr Rockey Situ   a. dx AFib 08/2016 s/p DCCV 10/2016-->eliquis (CHA2DS2VASc = 5).  . Pulmonary hypertension ([place])  last echo in epic 09-16-2016  . Recurrent prostate cancer ([place]) UROLOGIST-- DR BELL   s/p  radioactive prostate seed implants in 1997;    due to PSA rising pt has been doing lupron injection's few past several yrs at dr bell office  . Renal calculi    bilateral per CT 06-18-2018  . S/P CABG x 5 04/04/2005   LIMA to LAD,  SVG to Ramus,  SVG to OM,  seqSVG to PLV and PDA  . Wears glasses    Past Surgical History:  Procedure Laterality Date  . CARDIAC CATHETERIZATION  04/02/2005   dr Doreatha Lew     Critical three-vessel coronary disease --  Essentially normal left ventricular function , ef 55%  . CARDIOVASCULAR STRESS TEST  05-01-2008  dr Doreatha Lew /  dr harding   Low risk nuclear study w/ no ischemia or scar but flattened septal motion/  ef 64%  . CARDIOVERSION N/A 11/19/2016   Procedure: CARDIOVERSION;  Surgeon: Jerline Pain, MD;  Location: [place];  Service: Cardiovascular;  Laterality: N/A;  . COLONOSCOPY N/A 10/23/2016   Procedure: COLONOSCOPY;  Surgeon: Milus Banister, MD;  Location: [facility];  Service: Endoscopy;  Laterality: N/A;  . CORONARY ARTERY BYPASS GRAFT  04/04/2005   dr  Ricard Dillon   LIMA - LAD,  SVG - Ramus, SVG - OM,  seqSVG - PLV and PDA  . CYSTOSCOPY WITH LITHOLAPAXY N/A 05/07/2016   Procedure: CYSTOSCOPY WITH LITHOLAPAXY;  Surgeon: Rana Snare, MD;  Location: [facility];  Service: Urology;  Laterality: N/A;  . CYSTOSCOPY WITH RETROGRADE PYELOGRAM, URETEROSCOPY AND STENT PLACEMENT  09/03/2018   Procedure: CYSTOSCOPY WITH RETROGRADE PYELOGRAM, URETEROSCOPY LASER LITHOTRIPSY AND STENT PLACEMENT;  Surgeon: Lucas Mallow, MD;  Location: [facility];  Service: Urology;;  . HOLMIUM LASER APPLICATION N/A 76/19/5093   Procedure: HOLMIUM LASER APPLICATION;  Surgeon: Rana Snare, MD;  Location: [facility];  Service: Urology;  Laterality: N/A;  . INGUINAL HERNIA REPAIR Left 03-30-2005  dr Excell Seltzer   incarcerated   . RADIOACTIVE PROSTATE SEED IMPLANTS  1997  . TOTAL HIP ARTHROPLASTY Right 10/07/2018   Procedure: TOTAL HIP ARTHROPLASTY ANTERIOR APPROACH;  Surgeon: Rod Can, MD;  Location: [facility];  Service: Orthopedics;  Laterality: Right;  . TRANSTHORACIC ECHOCARDIOGRAM  08/24/2014   mid anteroseptal and distal septak hypokinetic,  mild focal basal LVH, ef 60-65%,  grade 1 diastolic dysfunction/  mild MR/  mild LAE/  mild dilated RV with normal RVSP/  trivial TR     A IV Location/Drains/Wounds Patient Lines/Drains/Airways Status    Active Line/Drains/Airways    Name:   Placement date:   Placement time:   Site:   Days:   Peripheral IV 03/08/19 Left Hand   03/08/19    0827    Hand   less than 1   Pressure Injury 10/09/18 Stage II -  Partial thickness loss of dermis presenting as a shallow open ulcer with a red, pink wound bed without slough. pt had this wound from home per his report; chronic partial thickness wounds related to shear, not pressur   10/09/18    0730     150          Intake/Output Last 24 hours  Intake/Output Summary (Last 24 hours) at 03/08/2019 1326 Last data filed at 03/08/2019 1010 Gross per 24 hour  Intake 100 ml  Output -  Net 100 ml    Labs/Imaging Results for orders placed or performed during the hospital  encounter of 03/08/19 (from the past 48 hour(s))  CBC with Differential     Status: Abnormal   Collection Time: 03/08/19  8:22 AM  Result Value Ref Range   WBC 16.2 (H) 4.0 - 10.5 K/uL   RBC 3.58 (L) 4.22 - 5.81 MIL/uL   Hemoglobin 10.5 (L) 13.0 - 17.0 g/dL   HCT 34.4 (L) 39.0 - 52.0 %   MCV 96.1 80.0 - 100.0 fL   MCH 29.3 26.0 - 34.0 pg   MCHC 30.5 30.0 - 36.0 g/dL   RDW 15.7 (H) 11.5 - 15.5 %   Platelets 249 150 - 400 K/uL   nRBC 0.0 0.0 - 0.2 %   Neutrophils Relative % 93 %   Neutro Abs 14.9 (H) 1.7 - 7.7 K/uL   Lymphocytes Relative 3 %   Lymphs Abs 0.5 (L) 0.7 - 4.0 K/uL   Monocytes Relative 4 %   Monocytes Absolute 0.7 0.1 - 1.0 K/uL   Eosinophils Relative 0 %   Eosinophils Absolute 0.0 0.0 - 0.5 K/uL   Basophils Relative 0 %   Basophils Absolute 0.0 0.0 - 0.1 K/uL   Immature Granulocytes 0 %   Abs Immature Granulocytes 0.07 0.00 - 0.07 K/uL    Comment: Performed at [facility], [address] [place], [place] [address]  Comprehensive metabolic panel     Status: Abnormal   Collection Time: 03/08/19  8:22 AM  Result Value Ref Range   Sodium 140 135 - 145 mmol/L   Potassium 3.8 3.5 - 5.1 mmol/L   Chloride 108 98 - 111 mmol/L   CO2 22 22 - 32 mmol/L    Glucose, Bld 105 (H) 70 - 99 mg/dL   BUN 19 8 - 23 mg/dL   Creatinine, Ser 1.63 (H) 0.61 - 1.24 mg/dL   Calcium 8.8 (L) 8.9 - 10.3 mg/dL   Total Protein 6.4 (L) 6.5 - 8.1 g/dL   Albumin 3.1 (L) 3.5 - 5.0 g/dL   AST 19 15 - 41 U/L   ALT 20 0 - 44 U/L   Alkaline Phosphatase 161 (H) 38 - 126 U/L   Total Bilirubin 0.4 0.3 - 1.2 mg/dL   GFR calc non Af Amer 39 (L) >60 mL/min   GFR calc Af Amer 45 (L) >60 mL/min   Anion gap 10 5 - 15    Comment: Performed at [facility], [address], [place], [place] [address]  Troponin I (High Sensitivity)     Status: None   Collection Time: 03/08/19  8:22 AM  Result Value Ref Range   Troponin I (High Sensitivity) 10 <18 ng/L    Comment: (NOTE) Elevated high sensitivity troponin I (hsTnI) values and significant  changes across serial measurements may suggest ACS but many other  chronic and acute conditions are known to elevate hsTnI results.  Refer to the "Links" section for chest pain algorithms and additional  guidance. Performed at [facility], [address] [place], [place] [address]   Urinalysis, Complete w Microscopic     Status: Abnormal   Collection Time: 03/08/19  9:01 AM  Result Value Ref Range   Color, Urine YELLOW (A) YELLOW   APPearance CLOUDY (A) CLEAR   Specific Gravity, Urine 1.016 1.005 - 1.030   pH 6.0 5.0 - 8.0   Glucose, UA NEGATIVE NEGATIVE mg/dL   Hgb urine dipstick MODERATE (A) NEGATIVE   Bilirubin Urine NEGATIVE NEGATIVE   Ketones, ur NEGATIVE NEGATIVE mg/dL   Protein, ur 30 (  A) NEGATIVE mg/dL   Nitrite NEGATIVE NEGATIVE   Leukocytes,Ua LARGE (A) NEGATIVE   RBC / HPF >50 (H) 0 - 5 RBC/hpf   WBC, UA >50 (H) 0 - 5 WBC/hpf   Bacteria, UA RARE (A) NONE SEEN   Squamous Epithelial / LPF 0-5 0 - 5   Mucus PRESENT     Comment: Performed at [facility], [address], [place], [place] [address]  SARS Coronavirus 2 [facility] order, Performed in [facility] hospital lab) Nasopharyngeal      Status: None   Collection Time: 03/08/19  9:01 AM   Specimen: Nasopharyngeal  Result Value Ref Range   SARS Coronavirus 2 NEGATIVE NEGATIVE    Comment: (NOTE) If result is NEGATIVE SARS-CoV-2 target nucleic acids are NOT DETECTED. The SARS-CoV-2 RNA is generally detectable in upper and lower  respiratory specimens during the acute phase of infection. The lowest  concentration of SARS-CoV-2 viral copies this assay can detect is 250  copies / mL. A negative result does not preclude SARS-CoV-2 infection  and should not be used as the sole basis for treatment or other  patient management decisions.  A negative result may occur with  improper specimen collection / handling, submission of specimen other  than nasopharyngeal swab, presence of viral mutation(s) within the  areas targeted by this assay, and inadequate number of viral copies  (<250 copies / mL). A negative result must be combined with clinical  observations, patient history, and epidemiological information. If result is POSITIVE SARS-CoV-2 target nucleic acids are DETECTED. The SARS-CoV-2 RNA is generally detectable in upper and lower  respiratory specimens dur ing the acute phase of infection.  Positive  results are indicative of active infection with SARS-CoV-2.  Clinical  correlation with patient history and other diagnostic information is  necessary to determine patient infection status.  Positive results do  not rule out bacterial infection or co-infection with other viruses. If result is PRESUMPTIVE POSTIVE SARS-CoV-2 nucleic acids MAY BE PRESENT.   A presumptive positive result was obtained on the submitted specimen  and confirmed on repeat testing.  While 2019 novel coronavirus  (SARS-CoV-2) nucleic acids may be present in the submitted sample  additional confirmatory testing may be necessary for epidemiological  and / or clinical management purposes  to differentiate between  SARS-CoV-2 and other Sarbecovirus  currently known to infect humans.  If clinically indicated additional testing with an alternate test  methodology [phone]) is advised. The SARS-CoV-2 RNA is generally  detectable in upper and lower respiratory sp ecimens during the acute  phase of infection. The expected result is Negative. Fact Sheet for Patients:  StrictlyIdeas.no Fact Sheet for Healthcare Providers: BankingDealers.co.za This test is not yet approved or cleared by the [place] FDA and has been authorized for detection and/or diagnosis of SARS-CoV-2 by FDA under an Emergency Use Authorization (EUA).  This EUA will remain in effect (meaning this test can be used) for the duration of the COVID-19 declaration under Section 564(b)(1) of the Act, 21 U.S.C. section 360bbb-3(b)(1), unless the authorization is terminated or revoked sooner. Performed at [facility], [address], [place], [place] [address]    Ct Abdomen Pelvis Wo Contrast  Result Date: 03/08/2019 CLINICAL DATA:  Recurrent urinary tract infection. EXAM: CT ABDOMEN AND PELVIS WITHOUT CONTRAST TECHNIQUE: Multidetector CT imaging of the abdomen and pelvis was performed following the standard protocol without IV contrast. COMPARISON:  CT scan of January 05, 2018. FINDINGS: Lower chest: No acute abnormality. Hepatobiliary:  Cholelithiasis is noted without inflammation. No definite biliary dilatation is noted. No hepatic abnormality is seen on these unenhanced images. Pancreas: Unremarkable. No pancreatic ductal dilatation or surrounding inflammatory changes. Spleen: Calcified splenic granulomata are noted. Adrenals/Urinary Tract: Adrenal glands appear normal. Bilateral renal cysts are noted, with the largest involving the upper pole of the left kidney. Stable staghorn type calculi are seen involving the upper lobe collecting system of the left kidney. There is associated cortical thinning or scarring in this area.  Stable right nephrolithiasis is noted. No hydronephrosis or renal obstruction is noted. Urinary bladder is unremarkable. Stomach/Bowel: Small sliding-type hiatal hernia is noted. Otherwise stomach is unremarkable. There is no evidence of bowel obstruction or inflammation. The appendix appears normal. Vascular/Lymphatic: 3.3 cm infrarenal abdominal aortic aneurysm is noted. Atherosclerosis of abdominal aorta is noted. No significant adenopathy is noted. Reproductive: Status post prostatic brachytherapy seed placement. Other: No abdominal wall hernia or abnormality. No abdominopelvic ascites. Musculoskeletal: No acute or significant osseous findings. IMPRESSION: 3.3 cm infrarenal abdominal aortic aneurysm. Recommend followup by ultrasound in 3 years. This recommendation follows ACR consensus guidelines: White Paper of the ACR Incidental Findings Committee II on Vascular Findings. J Am Coll Radiol 2013; 10:789-794. Cholelithiasis. Stable bilateral nephrolithiasis is noted without hydronephrosis or renal obstruction is noted. Stable cortical thinning and or scarring is seen involving upper pole of right kidney. Small sliding-type hiatal hernia. Aortic Atherosclerosis (ICD10-I70.0). Electronically Signed   By: Marijo Conception M.D.   On: 03/08/2019 12:48   Dg Lumbar Spine 2-3 Views  Result Date: 03/08/2019 CLINICAL DATA:  Low back pain after fall today. EXAM: LUMBAR SPINE - 2-3 VIEW COMPARISON:  Radiographs of March 27, 2017. FINDINGS: Stable old L1 compression fracture is noted. No acute fracture or spondylolisthesis is noted. Mild degenerative disc disease is noted at L2-3 and L4-5. Moderate degenerative disc disease is noted at L3-4. Severe degenerative disc disease is noted at L5-S1. Atherosclerosis of thoracic aorta is noted. IMPRESSION: Stable old L1 compression fracture. Multilevel degenerative disc disease. No acute abnormality seen in the lumbar spine. Aortic Atherosclerosis (ICD10-I70.0). Electronically  Signed   By: Marijo Conception M.D.   On: 03/08/2019 09:22   Dg Pelvis 1-2 Views  Result Date: 03/08/2019 CLINICAL DATA:  Pelvic pain after fall today. EXAM: PELVIS - 1-2 VIEW COMPARISON:  Radiograph October 07, 2018. FINDINGS: Status post right total hip arthroplasty. No fracture or dislocation is noted. Left hip is unremarkable. Status post prostatic brachytherapy seed placement. IMPRESSION: No acute abnormality seen in the pelvis. Electronically Signed   By: Marijo Conception M.D.   On: 03/08/2019 09:18   Ct Head Wo Contrast  Result Date: 03/08/2019 CLINICAL DATA:  Altered mental status, multiple falls EXAM: CT HEAD WITHOUT CONTRAST TECHNIQUE: Contiguous axial images were obtained from the base of the skull through the vertex without intravenous contrast. COMPARISON:  03/03/2019 FINDINGS: Brain: No evidence of acute infarction, hemorrhage, hydrocephalus, or extra-axial collection. Stable extra-axial left frontal mass with internal calcifications measuring approximately 20 x 10 mm, most consistent with meningioma. Scattered low-density changes within the periventricular and subcortical white matter suggesting chronic microvascular ischemic changes. Vascular: Mild atherosclerotic calcifications involving the large vessels of the skull base. No unexpected hyperdense vessel. Skull: No acute calvarial fracture or bone lesion. Sinuses/Orbits: Partial mucosal opacification of the frontal sinus and left ethmoid air cells. Remaining paranasal sinuses and mastoid air cells are clear. Orbital structures are intact. Other: None. IMPRESSION: 1. No acute intracranial findings. 2. Scattered chronic microvascular ischemic  changes. 3. Stable small left frontal extra-axial mass, most likely representing meningioma. Electronically Signed   By: Davina Poke M.D.   On: 03/08/2019 09:01   Dg Chest Port 1 View  Result Date: 03/08/2019 CLINICAL DATA:  Weakness. EXAM: PORTABLE CHEST 1 VIEW COMPARISON:  Radiographs of February 25, 2019. FINDINGS: The heart size and mediastinal contours are within normal limits. Both lungs are clear. Status post coronary artery bypass graft. Atherosclerosis of thoracic aorta is noted. No pneumothorax or pleural effusion is noted. The visualized skeletal structures are unremarkable. IMPRESSION: No active disease. Aortic Atherosclerosis (ICD10-I70.0). Electronically Signed   By: Marijo Conception M.D.   On: 03/08/2019 09:19    Pending Labs Unresulted Labs (From admission, onward)    Start     Ordered   03/09/19 2035  Basic metabolic panel  Tomorrow morning,   STAT     03/08/19 1132   03/09/19 0500  CBC  Tomorrow morning,   STAT     03/08/19 1132   03/08/19 0815  Urine culture  ONCE - STAT,   STAT     03/08/19 0814          Vitals/Pain Today's Vitals   03/08/19 1000 03/08/19 1030 03/08/19 1100 03/08/19 1141  BP: (!) 164/85 (!) 149/86 (!) 158/86 (!) 142/91  Pulse:    81  Resp: 15 15 15 16   Temp:      TempSrc:      SpO2:   (!) 16% 100%  Weight:      Height:      PainSc:        Isolation Precautions No active isolations  Medications Medications  acetaminophen (TYLENOL) tablet 1,000 mg (has no administration in time range)  amiodarone (PACERONE) tablet 200 mg (has no administration in time range)  rosuvastatin (CRESTOR) tablet 40 mg (has no administration in time range)  pantoprazole (PROTONIX) EC tablet 40 mg (has no administration in time range)  tamsulosin (FLOMAX) capsule 0.4 mg (has no administration in time range)  apixaban (ELIQUIS) tablet 5 mg (has no administration in time range)  0.9 %  sodium chloride infusion (has no administration in time range)  cefTRIAXone (ROCEPHIN) 1 g in sodium chloride 0.9 % 100 mL IVPB (has no administration in time range)  iohexol (OMNIPAQUE) 240 MG/ML injection 25 mL (has no administration in time range)  cefTRIAXone (ROCEPHIN) 1 g in sodium chloride 0.9 % 100 mL IVPB (0 g Intravenous Stopped 03/08/19 1010)    Mobility walks with  person assist High fall risk   Focused Assessments    R Recommendations: See Admitting Provider Note  Report given to:   Additional Notes:

## 2019-03-08 NOTE — ED Triage Notes (Signed)
Patient from home via [facility]. Per EMS, patient fell this morning but denies injury from fall. When asked by this RN, patient denies falling this morning but states he fell a few days ago because he last his balance. Denies hitting head or LOC. Patient reports increased weakness for the last 2 weeks. Patient lives by himself, however family reports patient has been increasingly confused over the last month. Patient recently finished abx for UTI.

## 2019-03-08 NOTE — ED Notes (Signed)
Pt eating meal tray at this time

## 2019-03-08 NOTE — Telephone Encounter (Signed)
It looks like he is back in the hospital. We can plan to follow-up with him again after discharge.

## 2019-03-08 NOTE — ED Notes (Signed)
Pt has returned from treatment room from CT scan and eating independently at this time. NAD

## 2019-03-08 NOTE — ED Notes (Signed)
Patient transported to CT 

## 2019-03-09 DIAGNOSIS — Z87891 Personal history of nicotine dependence: Secondary | ICD-10-CM

## 2019-03-09 DIAGNOSIS — R443 Hallucinations, unspecified: Secondary | ICD-10-CM

## 2019-03-09 DIAGNOSIS — Z881 Allergy status to other antibiotic agents status: Secondary | ICD-10-CM

## 2019-03-09 DIAGNOSIS — Z951 Presence of aortocoronary bypass graft: Secondary | ICD-10-CM

## 2019-03-09 DIAGNOSIS — I5030 Unspecified diastolic (congestive) heart failure: Secondary | ICD-10-CM

## 2019-03-09 DIAGNOSIS — Z96641 Presence of right artificial hip joint: Secondary | ICD-10-CM

## 2019-03-09 DIAGNOSIS — Z8781 Personal history of (healed) traumatic fracture: Secondary | ICD-10-CM

## 2019-03-09 DIAGNOSIS — R8281 Pyuria: Secondary | ICD-10-CM

## 2019-03-09 DIAGNOSIS — I2510 Atherosclerotic heart disease of native coronary artery without angina pectoris: Secondary | ICD-10-CM

## 2019-03-09 DIAGNOSIS — Z923 Personal history of irradiation: Secondary | ICD-10-CM

## 2019-03-09 DIAGNOSIS — R410 Disorientation, unspecified: Secondary | ICD-10-CM

## 2019-03-09 DIAGNOSIS — C61 Malignant neoplasm of prostate: Secondary | ICD-10-CM

## 2019-03-09 DIAGNOSIS — N189 Chronic kidney disease, unspecified: Secondary | ICD-10-CM

## 2019-03-09 LAB — CBC
HCT: 29.4 % — ABNORMAL LOW (ref 39.0–52.0)
Hemoglobin: 9.3 g/dL — ABNORMAL LOW (ref 13.0–17.0)
MCH: 29.4 pg (ref 26.0–34.0)
MCHC: 31.6 g/dL (ref 30.0–36.0)
MCV: 93 fL (ref 80.0–100.0)
Platelets: 193 10*3/uL (ref 150–400)
RBC: 3.16 MIL/uL — ABNORMAL LOW (ref 4.22–5.81)
RDW: 15.9 % — ABNORMAL HIGH (ref 11.5–15.5)
WBC: 12.2 10*3/uL — ABNORMAL HIGH (ref 4.0–10.5)
nRBC: 0 % (ref 0.0–0.2)

## 2019-03-09 LAB — BASIC METABOLIC PANEL
Anion gap: 7 (ref 5–15)
BUN: 18 mg/dL (ref 8–23)
CO2: 22 mmol/L (ref 22–32)
Calcium: 8.1 mg/dL — ABNORMAL LOW (ref 8.9–10.3)
Chloride: 112 mmol/L — ABNORMAL HIGH (ref 98–111)
Creatinine, Ser: 1.16 mg/dL (ref 0.61–1.24)
GFR calc Af Amer: >60 mL/min (ref >60)
GFR calc non Af Amer: 59 mL/min — ABNORMAL LOW (ref >60)
Glucose, Bld: 93 mg/dL (ref 70–99)
Potassium: 3.4 mmol/L — ABNORMAL LOW (ref 3.5–5.1)
Sodium: 141 mmol/L (ref 135–145)

## 2019-03-09 LAB — URINE CULTURE: Culture: 20000 — AB

## 2019-03-09 LAB — MAGNESIUM: Magnesium: 1.7 mg/dL (ref 1.7–2.4)

## 2019-03-09 MED ORDER — ACETAMINOPHEN 325 MG PO TABS
650.0000 mg | ORAL_TABLET | Freq: Four times a day (QID) | ORAL | Status: DC | PRN
  Administered 2019-03-09: 650 mg via ORAL
  Filled 2019-03-09: qty 2

## 2019-03-09 MED ORDER — BISACODYL 5 MG PO TBEC: 5.0000 mg | DELAYED_RELEASE_TABLET | Freq: Every day | ORAL | Status: DC | PRN

## 2019-03-09 MED ORDER — FLUCONAZOLE 100 MG PO TABS
100.0000 mg | ORAL_TABLET | Freq: Every day | ORAL | Status: AC
  Administered 2019-03-09 – 2019-03-10 (×2): 100 mg via ORAL
  Filled 2019-03-09 (×2): qty 1

## 2019-03-09 MED ORDER — APIXABAN 5 MG PO TABS
5.0000 mg | ORAL_TABLET | Freq: Two times a day (BID) | ORAL | Status: DC
  Administered 2019-03-09 – 2019-03-10 (×3): 5 mg via ORAL
  Filled 2019-03-09 (×3): qty 1

## 2019-03-09 MED ORDER — ONDANSETRON HCL 4 MG/2ML IJ SOLN: 4.0000 mg | Freq: Four times a day (QID) | INTRAMUSCULAR | Status: DC | PRN

## 2019-03-09 MED ORDER — SENNA 8.6 MG PO TABS: 1.0000 | ORAL_TABLET | Freq: Every day | ORAL | Status: DC | PRN

## 2019-03-09 NOTE — Evaluation (Signed)
Physical Therapy Evaluation Patient Details Name: Carl [name] MRN: [id number] DOB: [birth year] Today's Date: 03/09/2019   History of Present Illness  presented to ER secondary to falls, generalized weakness, AMS and hallucinations; admitted for management of recurrent UTI.  Clinical Impression  Upon evaluation, patient alert and oriented to basic information; follows commands and demonstrates fair participation with session with min encouragement from therapist.  Bilat UE/LE ROM grossly symmetrical and WFL; strength generally deconditioned, but functional for basic transfers and gait activities.  Standing balance deficits evident; requires unilateral UE support for functional reach outside BOS and use of RW/+1 assist with all mobility tasks.  Currently requiring min assist for bed mobility, sit/stand, basic transfers and gait (100') with RW.  Choppy gait pattern with cuing for walker position/use. Would benefit from skilled PT to address above deficits and promote optimal return to PLOF; Recommend transition to [place] upon discharge from acute hospitalization.     Follow Up Recommendations Home health PT    Equipment Recommendations  (has RW)    Recommendations for Other Services       Precautions / Restrictions Precautions Precautions: Fall Restrictions Weight Bearing Restrictions: No      Mobility  Bed Mobility Overal bed mobility: Needs Assistance Bed Mobility: Supine to Sit     Supine to sit: Min assist     General bed mobility comments: assist for truncal elevation  Transfers Overall transfer level: Needs assistance Equipment used: Rolling walker (2 wheeled) Transfers: Sit to/from Stand Sit to Stand: Min guard         General transfer comment: cuing for hand placement, tends to pull up on RW  Ambulation/Gait Ambulation/Gait assistance: Min assist Gait Distance (Feet): 100 Feet Assistive device: Rolling walker (2 wheeled)       General Gait Details:  forward flexed posture with RW arms length anterior to patient (cuing to correct); short, choppy steps with limited balance reactions. Do recommend continued use of RW and +1 at all times.  Vitals stable and WFL on RA throughout  Stairs            Wheelchair Mobility    Modified Rankin (Stroke Patients Only)       Balance Overall balance assessment: Needs assistance Sitting-balance support: No upper extremity supported;Feet supported Sitting balance-Leahy Scale: Good     Standing balance support: Bilateral upper extremity supported Standing balance-Leahy Scale: Fair                               Pertinent Vitals/Pain Pain Assessment: Faces Faces Pain Scale: Hurts a little bit Pain Location: generalized soreness Pain Descriptors / Indicators: Sore Pain Intervention(s): Limited activity within patient's tolerance;Monitored during session;Repositioned    Home Living Family/patient expects to be discharged to:: Private residence Living Arrangements: Children Available Help at Discharge: Family Type of Home: House Home Access: Stairs to enter Entrance Stairs-Rails: Right Entrance Stairs-Number of Steps: 6 on front and back Home Layout: One level Home Equipment: Shower seat;Cane - single point;Walker - 2 wheels;Wheelchair - manual      Prior Function Level of Independence: Independent with assistive device(s)         Comments: Mod indep for basic transfers and limited household mobility; grandson assists with ADLs as needed (sponge bath).  Does endorse at least 2 falls in recent weeks.     Hand Dominance        Extremity/Trunk Assessment   Upper Extremity Assessment Upper Extremity Assessment:  Overall [facility] for tasks assessed    Lower Extremity Assessment Lower Extremity Assessment: Overall WFL for tasks assessed(grossly 4-/5 throughout bilat LEs)       Communication   Communication: No difficulties  Cognition Arousal/Alertness:  Awake/alert Behavior During Therapy: WFL for tasks assessed/performed Overall Cognitive Status: Within Functional Limits for tasks assessed                                        General Comments      Exercises Other Exercises Other Exercises: Toilet transfer, ambulatory with RW, cga/min assist; sit/stand from [facility] with RW, cga/min assist; standing balance at sink for hand hygiene, cga/min assist.  Does require unilateral UE support for functional reach outside immediate BOS   Assessment/Plan    PT Assessment Patient needs continued PT services  PT Problem List Decreased strength;Decreased balance;Decreased knowledge of use of DME;Decreased mobility;Decreased activity tolerance;Decreased coordination;Decreased safety awareness       PT Treatment Interventions DME instruction;Functional mobility training;Balance training;Patient/family education;Gait training;Therapeutic activities;Stair training;Therapeutic exercise    PT Goals (Current goals can be found in the Care Plan section)  Acute Rehab PT Goals Patient Stated Goal: return home with daughter PT Goal Formulation: With patient Time For Goal Achievement: 03/23/19 Potential to Achieve Goals: Good    Frequency Min 2X/week   Barriers to discharge        Co-evaluation               AM-PAC PT "6 Clicks" Mobility  Outcome Measure Help needed turning from your back to your side while in a flat bed without using bedrails?: A Little Help needed moving from lying on your back to sitting on the side of a flat bed without using bedrails?: A Little Help needed moving to and from a bed to a chair (including a wheelchair)?: A Little Help needed standing up from a chair using your arms (e.g., wheelchair or bedside chair)?: A Little Help needed to walk in hospital room?: A Little Help needed climbing 3-5 steps with a railing? : A Little 6 Click Score: 18    End of Session Equipment Utilized During Treatment:  Gait belt Activity Tolerance: Patient tolerated treatment well Patient left: with call bell/phone within reach;in chair;with chair alarm set;with family/visitor present Nurse Communication: Mobility status PT Visit Diagnosis: Difficulty in walking, not elsewhere classified (R26.2);Muscle weakness (generalized) (M62.81);History of falling (Z91.81)    Time: 9458-5929 PT Time Calculation (min) (ACUTE ONLY): 23 min   Charges:   PT Evaluation $PT Eval Moderate Complexity: 1 Mod PT Treatments $Therapeutic Activity: 8-22 mins       Shawntavia Saunders H. Owens Shark, PT, DPT, NCS 03/09/19, 5:17 PM [phone]

## 2019-03-09 NOTE — Progress Notes (Addendum)
[facility] [place] NAME: Carl [name]    MR#:  [id number]  DATE OF BIRTH:  1935/08/01  SUBJECTIVE:  CHIEF COMPLAINT:   Chief Complaint  Patient presents with  . Fall  . Weakness   The patient has generalized weakness. REVIEW OF SYSTEMS:  Review of Systems  Constitutional: Positive for malaise/fatigue. Negative for chills and fever.  HENT: Negative for sore throat.   Eyes: Negative for blurred vision and double vision.  Respiratory: Negative for cough, hemoptysis, shortness of breath, wheezing and stridor.   Cardiovascular: Negative for chest pain, palpitations, orthopnea and leg swelling.  Gastrointestinal: Negative for abdominal pain, blood in stool, diarrhea, melena, nausea and vomiting.  Genitourinary: Negative for dysuria, flank pain, hematuria and urgency.  Musculoskeletal: Negative for back pain and joint pain.  Neurological: Negative for dizziness, sensory change, focal weakness, seizures, loss of consciousness, weakness and headaches.  Endo/Heme/Allergies: Negative for polydipsia.  Psychiatric/Behavioral: Negative for depression. The patient is not nervous/anxious.     DRUG ALLERGIES:   Allergies  Allergen Reactions  . Ciprofloxacin Other (See Comments)    PER HEART DR   VITALS:  Blood pressure 122/72, pulse 71, temperature 99 F (37.2 C), temperature source Oral, resp. rate 16, height 5\' 8"  (1.727 m), weight 71 kg, SpO2 98 %. PHYSICAL EXAMINATION:  Physical Exam Constitutional:      General: He is not in acute distress.    Appearance: Normal appearance.  HENT:     Head: Normocephalic.     Mouth/Throat:     Mouth: Mucous membranes are moist.  Eyes:     General: No scleral icterus.    Conjunctiva/sclera: Conjunctivae normal.     Pupils: Pupils are equal, round, and reactive to light.  Neck:     Musculoskeletal: Normal range of motion and neck supple.     Vascular: No JVD.     Trachea: No tracheal  deviation.  Cardiovascular:     Rate and Rhythm: Normal rate and regular rhythm.     Heart sounds: Normal heart sounds. No murmur. No gallop.   Pulmonary:     Effort: Pulmonary effort is normal. No respiratory distress.     Breath sounds: Normal breath sounds. No wheezing or rales.  Abdominal:     General: Bowel sounds are normal. There is no distension.     Palpations: Abdomen is soft.     Tenderness: There is no abdominal tenderness. There is no rebound.  Musculoskeletal: Normal range of motion.        General: No tenderness.     Right lower leg: No edema.     Left lower leg: No edema.  Skin:    Findings: No erythema or rash.  Neurological:     General: No focal deficit present.     Mental Status: He is alert and oriented to person, place, and time.     Cranial Nerves: No cranial nerve deficit.    LABORATORY PANEL:  Male CBC Recent Labs  Lab 03/09/19 0624  WBC 12.2*  HGB 9.3*  HCT 29.4*  PLT 193   ------------------------------------------------------------------------------------------------------------------ Chemistries  Recent Labs  Lab 03/08/19 0822 03/09/19 0624  NA 140 141  K 3.8 3.4*  CL 108 112*  CO2 22 22  GLUCOSE 105* 93  BUN 19 18  CREATININE 1.63* 1.16  CALCIUM 8.8* 8.1*  AST 19  --   ALT 20  --   ALKPHOS 161*  --   BILITOT 0.4  --  RADIOLOGY:  No results found. ASSESSMENT AND PLAN:   83 y.o. male Carl [name] is a 83 year old male with past medical history of heart failure with preserved ejection fraction, AAA, chronic anemia, CKD stage III, CAD status post CABG, paroxysmal A. fib, pulmonary hypertension, and prostate cancer presenting to the ED with altered mental status.  1. Altered mental status -likely secondary to UTI. - Chest x-ray shows no active cardiopulmonary process - CT head negative for acute intracranial abnormality Improved.  2.  Recurrent UTI -recent admission on 03/02/19 with altered mental status and found to have  UTI - UA positive for UTI - Urine cultures pending - Urine cultures from 03/03/19 showed colonies with yeast treated with IV Rocephin and oral Augmentin at discharge. Unclear if he completed the dose since he lives by himself Continue IV ceftriaxone and start Diflucan. - Obtain CT abdomen and pelvis - ID consult due to recurrent infection with candida  3.  Acute on chronic CKD -BUN/creatinine slightly elevated -Avoid nephrotoxins Improved.  4.  Paroxysmal atrial fibrillation -rate controlled on amiodarone -Continue anticoagulation with Eliquis  5. HLD  + Goal LDL<100 - Rosuvastatin 40mg  PO qhs  6. Debility/generalized weakness PT and ambulate patient.  AAA: CT abdomen 3.3 cm infrarenal abdominal aortic aneurysm. Recommend followup by ultrasound in 3 years.  Cholelithiasis.  Stable. Bilateral nephrolithiasis.  Stable.  All the records are reviewed and case discussed with Care Management/Social Worker. Management plans discussed with the patient, his daughter and they are in agreement.  CODE STATUS: Full Code  TOTAL TIME TAKING CARE OF THIS PATIENT: 32 minutes.   More than 50% of the time was spent in counseling/coordination of care: YES  POSSIBLE D/C IN 2 DAYS, DEPENDING ON CLINICAL CONDITION.   Demetrios Loll M.D on 03/09/2019 at 1:52 PM  Between 7am to 6pm - Pager - [phone]  After 6pm go to [email or web address] - [facility]

## 2019-03-09 NOTE — Consult Note (Signed)
NAME: Carl [name]  DOB: March 16, 1936  MRN: [id number]  Date/Time: 03/09/2019 3:16 PM  REQUESTING PROVIDER: Rufina Falco Subjective:  REASON FOR CONSULT: Recurrent UTI ? KEIRON IODICE is a 83 y.o. male with a history of CHF, right total hip arthroplasty following a fall and a fracture, CKD , coronary artery disease with CABG, prostate cancer with radiation seeds, recurrent hospitalization recently is admitted to the hospital with altered mental status.  Patient on 02/25/2019 presented to the emergency department after a fall and bruising his right side of the body.  Was discharged from the ED He was brought back to the ED on 03/02/2019 with confusion.  He was admitted to [facility] and was discharged on 03/04/2019.[name]  He was initially diagnosed with a urinary tract infection but his urine culture just had yeast.  He received ceftriaxone and then was sent home on 5 more days of Augmentin.  On 03/07/2019 his daughter called the nurse at his primary care office and told that he was having hallucination.  So he was asked to go to the ED.    In the ED his vitals were respiratory rate of 16, temperature of 98.8, and BP of 123/75 Initial labs revealed WBC of 16.2 and creatinine of 1.63.  The creatinine on 03/03/2019 on discharge was 1.10U.  UA and blood cultures were sent.  He was started on IV fluids and also was started on IV ceftriaxone and p.o. fluconazole. I am asked to see the patient because of  UTI. Patient is totally awake and alert today.  He denied any fever.  He says he has frequency of urine and may not be emptying the bladder completely he thinks.  He lives with his daughter he says Past Medical History:  Diagnosis Date  . (HFpEF) heart failure with preserved ejection fraction ([place])    a. 08/2016 Echo: EF 50-55%.  [name] AAA (abdominal aortic aneurysm) ([place])    CT abd. dated 06-18-2018 in epic,  3.1cm;  followed by pcp  . Arthritis    arms  . Bladder calculus   . Chronic anemia    . Chronic anxiety   . CKD (chronic kidney disease), stage III ([place])   . Coronary artery disease CARDIOLOGIST-  DR Rockey Situ   a. CABG 2006 with  LIMA-LAD, SVG-Ramus, SVG-OM, seq SVG-PLV-PDA.  . DOE (dyspnea on exertion)   . GERD (gastroesophageal reflux disease)   . Hematuria   . History of bladder stone   . History of non-ST elevation myocardial infarction (NSTEMI) 03/30/2005   post op recovery inguinal hernia repair  . History of recurrent UTIs    last admission 08-04-2018 pseudomonas UTI  . Hyperlipidemia   . Hyperplasia of prostate without lower urinary tract symptoms (LUTS)   . Hypertension   . Ischemic cardiomyopathy    a. echo (01/13) 45-50% septal akinesis, mild MR; b. Echo  (01/16) ef 60-65%, mid-apical anteroseptal hypokinesis; c. echo 09-16-2016 ef 50-55%  . Left ureteral stone   . Lung nodule    noted 08/ 2018;  followed by pcp  . Moderate mitral regurgitation    a.08/2016 Echo: Mod MR/TR.  [name] Orthostatic hypotension   . PAC (premature atrial contraction)   . Paroxysmal atrial flutter ([place])    a. dx 08/2016, started on amiodarone.  . Persistent atrial fibrillation followed by cardiologist-- dr Rockey Situ   a. dx AFib 08/2016 s/p DCCV 10/2016-->eliquis (CHA2DS2VASc = 5).  . Pulmonary hypertension ([place])    last echo in epic 09-16-2016  .  Recurrent prostate cancer ([place]) UROLOGIST-- DR BELL   s/p  radioactive prostate seed implants in 1997;    due to PSA rising pt has been doing lupron injection's few past several yrs at dr bell office  . Renal calculi    bilateral per CT 06-18-2018  . S/P CABG x 5 04/04/2005   LIMA to LAD,  SVG to Ramus,  SVG to OM,  seqSVG to PLV and PDA  . Wears glasses     Past Surgical History:  Procedure Laterality Date  . CARDIAC CATHETERIZATION  04/02/2005   dr Doreatha Lew    Critical three-vessel coronary disease --  Essentially normal left ventricular function , ef 55%  . CARDIOVASCULAR STRESS TEST  05-01-2008  dr Doreatha Lew /  dr harding   Low risk  nuclear study w/ no ischemia or scar but flattened septal motion/  ef 64%  . CARDIOVERSION N/A 11/19/2016   Procedure: CARDIOVERSION;  Surgeon: Jerline Pain, MD;  Location: [facility];  Service: Cardiovascular;  Laterality: N/A;  . COLONOSCOPY N/A 10/23/2016   Procedure: COLONOSCOPY;  Surgeon: Milus Banister, MD;  Location: [facility];  Service: Endoscopy;  Laterality: N/A;  . CORONARY ARTERY BYPASS GRAFT  04/04/2005   dr  Ricard Dillon   LIMA - LAD,  SVG - Ramus, SVG - OM,  seqSVG - PLV and PDA  . CYSTOSCOPY WITH LITHOLAPAXY N/A 05/07/2016   Procedure: CYSTOSCOPY WITH LITHOLAPAXY;  Surgeon: Rana Snare, MD;  Location: [facility];  Service: Urology;  Laterality: N/A;  . CYSTOSCOPY WITH RETROGRADE PYELOGRAM, URETEROSCOPY AND STENT PLACEMENT  09/03/2018   Procedure: CYSTOSCOPY WITH RETROGRADE PYELOGRAM, URETEROSCOPY LASER LITHOTRIPSY AND STENT PLACEMENT;  Surgeon: Lucas Mallow, MD;  Location: [facility];  Service: Urology;;  . HOLMIUM LASER APPLICATION N/A [id number]/41/6384   Procedure: HOLMIUM LASER APPLICATION;  Surgeon: Rana Snare, MD;  Location: [facility];  Service: Urology;  Laterality: N/A;  . INGUINAL HERNIA REPAIR Left 03-30-2005  dr Excell Seltzer   incarcerated   . RADIOACTIVE PROSTATE SEED IMPLANTS  1997  . TOTAL HIP ARTHROPLASTY Right 10/07/2018   Procedure: TOTAL HIP ARTHROPLASTY ANTERIOR APPROACH;  Surgeon: Rod Can, MD;  Location: [facility];  Service: Orthopedics;  Laterality: Right;  . TRANSTHORACIC ECHOCARDIOGRAM  08/24/2014   mid anteroseptal and distal septak hypokinetic,  mild focal basal LVH, ef 60-65%,  grade 1 diastolic dysfunction/  mild MR/  mild LAE/  mild dilated RV with normal RVSP/  trivial TR    Social History   Socioeconomic History  . Marital status: Widowed    Spouse name: Not on file  . Number of children: 3  . Years of education: Not on file  . Highest education level: Not on file  Occupational History  . Occupation: retired   Scientific laboratory technician  . Financial resource strain: Not on file  . Food insecurity    Worry: Not on file    Inability: Not on file  . Transportation needs    Medical: Not on file    Non-medical: Not on file  Tobacco Use  . Smoking status: Former Smoker    Years: 2.00    Types: Cigarettes    Quit date: 07/28/1960    Years since quitting: 58.6  . Smokeless tobacco: Never Used  Substance and Sexual Activity  . Alcohol use: No  . Drug use: No  . Sexual activity: Never  Lifestyle  . Physical activity    Days per week: 0 days    Minutes per  session: Not on file  . Stress: Only a little  Relationships  . Social Herbalist on phone: Not on file    Gets together: Not on file    Attends religious service: Not on file    Active member of club or organization: Not on file    Attends meetings of clubs or organizations: Not on file    Relationship status: Not on file  . Intimate partner violence    Fear of current or ex partner: Not on file    Emotionally abused: Not on file    Physically abused: Not on file    Forced sexual activity: Not on file  Other Topics Concern  . Not on file  Social History Narrative   Widowed lives with daughter [name]    Family History  Problem Relation Age of Onset  . Heart disease Mother   . Cancer Mother   . Cancer Father   . Cancer Brother    Allergies  Allergen Reactions  . Ciprofloxacin Other (See Comments)    PER HEART DR    ? Current Facility-Administered Medications  Medication Dose Route Frequency Provider Last Rate Last Dose  . acetaminophen (TYLENOL) tablet 650 mg  650 mg Oral Q6H PRN Demetrios Loll, MD      . amiodarone (PACERONE) tablet 200 mg  200 mg Oral Daily Lang Snow, NP   200 mg at 03/09/19 0856  . apixaban (ELIQUIS) tablet 5 mg  5 mg Oral BID Dallie Piles, RPH   5 mg at 03/09/19 0856  . bisacodyl (DULCOLAX) EC tablet 5 mg  5 mg Oral Daily PRN Demetrios Loll, MD      . cefTRIAXone (ROCEPHIN) 1 g in sodium  chloride 0.9 % 100 mL IVPB  1 g Intravenous Q24H Lang Snow, NP 200 mL/hr at 03/09/19 0856 1 g at 03/09/19 0856  . fluconazole (DIFLUCAN) tablet 100 mg  100 mg Oral Daily Demetrios Loll, MD   100 mg at 03/09/19 0900  . iohexol (OMNIPAQUE) 240 MG/ML injection 25 mL  25 mL Oral Once PRN Lang Snow, NP      . pantoprazole (PROTONIX) EC tablet 40 mg  40 mg Oral Daily Lang Snow, NP   40 mg at 03/09/19 0856  . rosuvastatin (CRESTOR) tablet 40 mg  40 mg Oral Daily Lang Snow, NP   40 mg at 03/09/19 0856  . senna (SENOKOT) tablet 8.6 mg  1 tablet Oral Daily PRN Demetrios Loll, MD      . tamsulosin [facility]) capsule 0.4 mg  0.4 mg Oral QPC supper Lang Snow, NP   0.4 mg at 03/08/19 1623     Abtx:  Anti-infectives (From admission, onward)   Start     Dose/Rate Route Frequency Ordered Stop   03/09/19 1000  cefTRIAXone (ROCEPHIN) 1 g in sodium chloride 0.9 % 100 mL IVPB     1 g 200 mL/hr over 30 Minutes Intravenous Every 24 hours 03/08/19 1132     03/09/19 1000  fluconazole (DIFLUCAN) tablet 100 mg     100 mg Oral Daily 03/09/19 0650     03/08/19 0945  cefTRIAXone (ROCEPHIN) 1 g in sodium chloride 0.9 % 100 mL IVPB     1 g 200 mL/hr over 30 Minutes Intravenous  Once 03/08/19 0930 03/08/19 1010      REVIEW OF SYSTEMS:  Const: negative fever, negative chills, negative weight loss Eyes: negative diplopia or visual changes, negative eye  pain ENT: negative coryza, negative sore throat Resp: negative cough, hemoptysis, dyspnea Cards: negative for chest pain, palpitations, lower extremity edema GU: No dysuria or hematuria.  Otherwise see above GI: Negative for abdominal pain, diarrhea, bleeding, constipation Skin: negative for rash and pruritus Heme: negative for easy bruising and gum/nose bleeding MS: Generalized weakness.  Has a walker Neurolo:negative for headaches, dizziness, vertigo, memory problems  Psych: negative for feelings of anxiety,  depression  Endocrine: negative for thyroid, diabetes Allergy/Immunology-allergic to ciprofloxacin.  ?  Objective:  VITALS:  BP 122/72   Pulse 71   Temp 99 F (37.2 C) (Oral)   Resp 16   Ht 5\' 8"  (1.727 m)   Wt 71 kg   SpO2 98%   BMI 23.80 kg/m  PHYSICAL EXAM:  General: Alert, cooperative, no distress, appears stated age.  Oriented x5.  Pale Head: Normocephalic, without obvious abnormality, atraumatic. Eyes: Conjunctivae clear, anicteric sclerae. Pupils are equal ENT Nares normal. No drainage or sinus tenderness. Lips, mucosa, and tongue normal. No Thrush Neck: Supple, symmetrical, no adenopathy, thyroid: non tender no carotid bruit and no JVD. Back: No CVA tenderness. Lungs: Clear to auscultation bilaterally. No Wheezing or Rhonchi. No rales. Heart: F7-T0, 2 / 6 systolic murmur Abdomen: Soft, non-tender,not distended. Bowel sounds normal. No masses Extremities: atraumatic, no cyanosis. No edema. No clubbing Skin: No rashes or lesions. Or bruising Lymph: Cervical, supraclavicular normal. Neurologic: Grossly non-focal Pertinent Labs Lab Results CBC    Component Value Date/Time   WBC 12.2 (H) 03/09/2019 0624   RBC 3.16 (L) 03/09/2019 0624   HGB 9.3 (L) 03/09/2019 0624   HGB 11.3 (L) 02/19/2017 1246   HCT 29.4 (L) 03/09/2019 0624   HCT 35.5 (L) 02/19/2017 1246   PLT 193 03/09/2019 0624   PLT 226 02/19/2017 1246   MCV 93.0 03/09/2019 0624   MCV 83 02/19/2017 1246   MCH 29.4 03/09/2019 0624   MCHC 31.6 03/09/2019 0624   RDW 15.9 (H) 03/09/2019 0624   RDW 21.6 (H) 02/19/2017 1246   LYMPHSABS 0.5 (L) 03/08/2019 0822   LYMPHSABS 2.2 02/19/2017 1246   MONOABS 0.7 03/08/2019 0822   EOSABS 0.0 03/08/2019 0822   EOSABS 0.1 02/19/2017 1246   BASOSABS 0.0 03/08/2019 0822   BASOSABS 0.0 02/19/2017 1246    CMP Latest Ref Rng & Units 03/09/2019 03/08/2019 03/03/2019  Glucose 70 - 99 mg/dL 93 105(H) 92  BUN 8 - 23 mg/dL 18 19 18   Creatinine 0.61 - 1.24 mg/dL 1.16 1.63(H) 1.10   Sodium 135 - 145 mmol/L 141 140 141  Potassium 3.5 - 5.1 mmol/L 3.4(L) 3.8 3.5  Chloride 98 - 111 mmol/L 112(H) 108 112(H)  CO2 22 - 32 mmol/L 22 22 22   Calcium 8.9 - 10.3 mg/dL 8.1(L) 8.8(L) 7.8(L)  Total Protein 6.5 - 8.1 g/dL - 6.4(L) 5.6(L)  Total Bilirubin 0.3 - 1.2 mg/dL - 0.4 0.5  Alkaline Phos 38 - 126 U/L - 161(H) 75  AST 15 - 41 U/L - 19 16  ALT 0 - 44 U/L - 20 16      Microbiology: Recent Results (from the past 240 hour(s))  Urine Culture     Status: Abnormal   Collection Time: 03/02/19  9:34 PM   Specimen: Urine, Clean Catch  Result Value Ref Range Status   Specimen Description   Final    URINE, CLEAN CATCH Performed at [facility], [place] [address] [place], [place] [address]    Special Requests   Final  NONE Performed at [facility], [place] [address], [place], [place] [address]    Culture 20,000 COLONIES/mL YEAST (A)  Final   Report Status 03/03/2019 FINAL  Final  SARS CORONAVIRUS 2 Nasal Swab Aptima Multi Swab     Status: None   Collection Time: 03/03/19 12:06 AM   Specimen: Aptima Multi Swab; Nasal Swab  Result Value Ref Range Status   SARS Coronavirus 2 NEGATIVE NEGATIVE Final    Comment: (NOTE) SARS-CoV-2 target nucleic acids are NOT DETECTED. The SARS-CoV-2 RNA is generally detectable in upper and lower respiratory specimens during the acute phase of infection. Negative results do not preclude SARS-CoV-2 infection, do not rule out co-infections with other pathogens, and should not be used as the sole basis for treatment or other patient management decisions. Negative results must be combined with clinical observations, patient history, and epidemiological information. The expected result is Negative. Fact Sheet for Patients: SugarRoll.be Fact Sheet for Healthcare Providers: https://www.woods-mathews.com/ This test is not yet approved or cleared by the [place] FDA  and  has been authorized for detection and/or diagnosis of SARS-CoV-2 by FDA under an Emergency Use Authorization (EUA). This EUA will remain  in effect (meaning this test can be used) for the duration of the COVID-19 declaration under Section 56 4(b)(1) of the Act, 21 U.S.C. section 360bbb-3(b)(1), unless the authorization is terminated or revoked sooner. Performed at [facility], [place] [address] [place], [place] [address]   Urine culture     Status: Abnormal   Collection Time: 03/08/19  9:01 AM   Specimen: Urine, Random  Result Value Ref Range Status   Specimen Description   Final    URINE, RANDOM Performed at [facility], [address], [place], [place] [address]    Special Requests   Final    NONE Performed at [facility], [place] [place], [place] [address]    Culture 20,000 COLONIES/mL YEAST (A)  Final   Report Status 03/09/2019 FINAL  Final  SARS Coronavirus 2 [facility] order, Performed in [facility] hospital lab) Nasopharyngeal     Status: None   Collection Time: 03/08/19  9:01 AM   Specimen: Nasopharyngeal  Result Value Ref Range Status   SARS Coronavirus 2 NEGATIVE NEGATIVE Final    Comment: (NOTE) If result is NEGATIVE SARS-CoV-2 target nucleic acids are NOT DETECTED. The SARS-CoV-2 RNA is generally detectable in upper and lower  respiratory specimens during the acute phase of infection. The lowest  concentration of SARS-CoV-2 viral copies this assay can detect is 250  copies / mL. A negative result does not preclude SARS-CoV-2 infection  and should not be used as the sole basis for treatment or other  patient management decisions.  A negative result may occur with  improper specimen collection / handling, submission of specimen other  than nasopharyngeal swab, presence of viral mutation(s) within the  areas targeted by this assay, and inadequate number of viral copies  (<250 copies / mL). A negative result must be combined  with clinical  observations, patient history, and epidemiological information. If result is POSITIVE SARS-CoV-2 target nucleic acids are DETECTED. The SARS-CoV-2 RNA is generally detectable in upper and lower  respiratory specimens dur ing the acute phase of infection.  Positive  results are indicative of active infection with SARS-CoV-2.  Clinical  correlation with patient history and other diagnostic information is  necessary to determine patient infection status.  Positive results do  not rule out bacterial infection  or co-infection with other viruses. If result is PRESUMPTIVE POSTIVE SARS-CoV-2 nucleic acids MAY BE PRESENT.   A presumptive positive result was obtained on the submitted specimen  and confirmed on repeat testing.  While 2019 novel coronavirus  (SARS-CoV-2) nucleic acids may be present in the submitted sample  additional confirmatory testing may be necessary for epidemiological  and / or clinical management purposes  to differentiate between  SARS-CoV-2 and other Sarbecovirus currently known to infect humans.  If clinically indicated additional testing with an alternate test  methodology [phone]) is advised. The SARS-CoV-2 RNA is generally  detectable in upper and lower respiratory sp ecimens during the acute  phase of infection. The expected result is Negative. Fact Sheet for Patients:  StrictlyIdeas.no Fact Sheet for Healthcare Providers: BankingDealers.co.za This test is not yet approved or cleared by the [place] FDA and has been authorized for detection and/or diagnosis of SARS-CoV-2 by FDA under an Emergency Use Authorization (EUA).  This EUA will remain in effect (meaning this test can be used) for the duration of the COVID-19 declaration under Section 564(b)(1) of the Act, 21 U.S.C. section 360bbb-3(b)(1), unless the authorization is terminated or revoked sooner. Performed at [facility], [address] [place], [place] [address]     IMAGING RESULTS: CT of the abdomen and pelvis .3 cm infrarenal abdominal aortic aneurysm. Cholelithiasis. Stable bilateral nephrolithiasis is noted without hydronephrosis or renal obstruction is noted. Stable cortical thinning and or scarring is seen involving upper pole of right kidney. I have personally reviewed the films ? Impression/Recommendation ? ?83 year old male was admitted with hallucination and confusion.  Hallucinations/confusion.  Improved within 12 hours.  Very likely due to hydration.  So I wonder whether the confusion is because of dehydration and possibly him not having adequate consumption of fluids .  Pyuria present.  Culture is growing only yeast.  I am not sure whether he has UTI especially with him being on antibiotics recently.  He may have incomplete bladder emptying secondary to the prostate.  He needs post void bladder scan to look for significant residual. We will DC ceftriaxone.  He is currently on fluconazole which can be DC'd tomorrow.  H/o right total hip arthroplasty for hip fracture following a fall.  ___________________________________________________ Discussed with patient and his nurse.   Note:  This document was prepared using Dragon voice recognition software and may include unintentional dictation errors.

## 2019-03-09 NOTE — Progress Notes (Signed)
Advanced Care Plan.  Purpose of Encounter: CODE STATUS. Parties in Attendance: The patient and me. Patient's Decisional Capacity: Yes. Medical Story: Carl [name] is a 83 year old male with past medical history of heart failure with preserved ejection fraction, AAA, chronic anemia, CKD stage III, CAD status post CABG, paroxysmal A. fib, pulmonary hypertension, and prostate cancer he is admitted for altered mental status due to UTI and acute renal failure.  The patient is alert, awake and oriented now.  I discussed with patient about his current condition, treatment plan, prognosis and CODE STATUS.  The patient does want to be resuscitated and intubated if he has cardiopulmonary arrest. Plan:  Code Status: Full code Time spent discussing advance care planning:17 minutes.

## 2019-03-09 NOTE — Progress Notes (Signed)
PT Cancellation Note  Patient Details Name: Carl [name] MRN: [id number] DOB: 08-19-1935   Cancelled Treatment:    Reason Eval/Treat Not Completed: (Consult received and chartr reviewed. Patient currently declining participation due to fatigue/general soreness; repeatedly stating "I'd rather not" despite multiple offers and mod encouragement.  Will continue efforts at later time/date as medically appropriate and patient agreeable.)  Raelynne Ludwick H. Owens Shark, PT, DPT, NCS 03/09/19, 10:51 AM [phone]

## 2019-03-10 LAB — BASIC METABOLIC PANEL
Anion gap: 9 (ref 5–15)
BUN: 16 mg/dL (ref 8–23)
CO2: 22 mmol/L (ref 22–32)
Calcium: 8.2 mg/dL — ABNORMAL LOW (ref 8.9–10.3)
Chloride: 110 mmol/L (ref 98–111)
Creatinine, Ser: 1.29 mg/dL — ABNORMAL HIGH (ref 0.61–1.24)
GFR calc Af Amer: 60 mL/min — ABNORMAL LOW (ref >60)
GFR calc non Af Amer: 52 mL/min — ABNORMAL LOW (ref >60)
Glucose, Bld: 130 mg/dL — ABNORMAL HIGH (ref 70–99)
Potassium: 3.3 mmol/L — ABNORMAL LOW (ref 3.5–5.1)
Sodium: 141 mmol/L (ref 135–145)

## 2019-03-10 LAB — MAGNESIUM: Magnesium: 1.9 mg/dL (ref 1.7–2.4)

## 2019-03-10 MED ORDER — POTASSIUM CHLORIDE CRYS ER 20 MEQ PO TBCR
40.0000 meq | EXTENDED_RELEASE_TABLET | Freq: Once | ORAL | Status: AC
  Administered 2019-03-10: 40 meq via ORAL
  Filled 2019-03-10: qty 2

## 2019-03-10 NOTE — Telephone Encounter (Signed)
When does the pt need to have labs done? Please advise?

## 2019-03-10 NOTE — Plan of Care (Signed)
Patient ambulated in the hallway with front wheel walker without difficulty or complaints of pain. Patient also urinated and had a bowel movement last night when I took him to the bathroom.  Will continue to monitor.

## 2019-03-10 NOTE — Telephone Encounter (Signed)
He was discharged today. He has follow-up with me next week. He can keep that follow-up and we can obtain labs then.

## 2019-03-10 NOTE — Discharge Instructions (Signed)
HHPT, fall precaution. °

## 2019-03-10 NOTE — Clinical Social Work Note (Addendum)
Patient is active with [facility] for home health RN and PT.  Dayton Scrape, [place]  12:04 pm Notified [facility] representative that patient is discharging home today and that his home health orders are in. CSW signing off.  Dayton Scrape, [place]

## 2019-03-10 NOTE — Discharge Summary (Signed)
[facility] NAME: Carl [name]    MR#:  [id number]  DATE OF BIRTH:  06/14/1936  DATE OF ADMISSION:  03/08/2019   ADMITTING PHYSICIAN: Lang Snow, NP  DATE OF DISCHARGE: 03/10/2019  1:21 PM  PRIMARY CARE PHYSICIAN: [name], MD   ADMISSION DIAGNOSIS:  Metabolic encephalopathy [B28.41] Lower urinary tract infectious disease [N39.0] Generalized weakness [R53.1] Fall, initial encounter [W19.XXXA] DISCHARGE DIAGNOSIS:  Active Problems:   Altered mental status  SECONDARY DIAGNOSIS:   Past Medical History:  Diagnosis Date  . (HFpEF) heart failure with preserved ejection fraction ([place])    a. 08/2016 Echo: EF 50-55%.  [name] AAA (abdominal aortic aneurysm) ([place])    CT abd. dated 06-18-2018 in epic,  3.1cm;  followed by pcp  . Arthritis    arms  . Bladder calculus   . Chronic anemia   . Chronic anxiety   . CKD (chronic kidney disease), stage III ([place])   . Coronary artery disease CARDIOLOGIST-  DR Rockey Situ   a. CABG 2006 with  LIMA-LAD, SVG-Ramus, SVG-OM, seq SVG-PLV-PDA.  . DOE (dyspnea on exertion)   . GERD (gastroesophageal reflux disease)   . Hematuria   . History of bladder stone   . History of non-ST elevation myocardial infarction (NSTEMI) 03/30/2005   post op recovery inguinal hernia repair  . History of recurrent UTIs    last admission 08-04-2018 pseudomonas UTI  . Hyperlipidemia   . Hyperplasia of prostate without lower urinary tract symptoms (LUTS)   . Hypertension   . Ischemic cardiomyopathy    a. echo (01/13) 45-50% septal akinesis, mild MR; b. Echo  (01/16) ef 60-65%, mid-apical anteroseptal hypokinesis; c. echo 09-16-2016 ef 50-55%  . Left ureteral stone   . Lung nodule    noted 08/ 2018;  followed by pcp  . Moderate mitral regurgitation    a.08/2016 Echo: Mod MR/TR.  [name] Orthostatic hypotension   . PAC (premature atrial contraction)   . Paroxysmal atrial flutter ([place])    a. dx 08/2016,  started on amiodarone.  . Persistent atrial fibrillation followed by cardiologist-- dr Rockey Situ   a. dx AFib 08/2016 s/p DCCV 10/2016-->eliquis (CHA2DS2VASc = 5).  . Pulmonary hypertension ([place])    last echo in epic 09-16-2016  . Recurrent prostate cancer ([place]) UROLOGIST-- DR BELL   s/p  radioactive prostate seed implants in 1997;    due to PSA rising pt has been doing lupron injection's few past several yrs at dr bell office  . Renal calculi    bilateral per CT 06-18-2018  . S/P CABG x 5 04/04/2005   LIMA to LAD,  SVG to Ramus,  SVG to OM,  seqSVG to PLV and PDA  . Wears glasses    HOSPITAL COURSE:  83 y.o.maleCharles [name] is a 83 year old male with past medical history of heart failure with preserved ejection fraction, AAA, chronic anemia, CKD stage III, CAD status post CABG, paroxysmal A. fib, pulmonary hypertension, and prostate cancer presenting to the ED with altered mental status.  1. Altered mental status -likely secondary to UTI. -Chest x-ray shows no active cardiopulmonary process -CT head negative for acute intracranial abnormality Improved.  2.Recurrent UTI -recent admission on8/5/20with altered mental status and found to have UTI -UA positive for UTI -Urine cultures pending -Urine cultures from8/6/20showed colonies with yeast treated with IV Rocephin and oral Augmentin at discharge. Unclear if he completed the dose since he lives by himself Continue IV ceftriaxone and started  Diflucan. - Obtain CT abdomen and pelvis Rocephin is discontinued per Dr. Arlyss Repress.  The patient is treated with Diflucan for 2 days per Dr. Ramon Dredge s recommendation.[name]  3.Acute on chronic CKD -BUN/creatinine slightly elevated -Avoid nephrotoxins Improved.  4.Paroxysmal atrial fibrillation -rate controlled on amiodarone -Continue anticoagulation with Eliquis  5.HLD  + Goal LDL<100 -Rosuvastatin 40mg  PO qhs  6.Debility/generalized weakness PT and ambulate  patient.  AAA: CT abdomen 3.3 cm infrarenal abdominal aortic aneurysm. Recommend followup by ultrasound in 3 years.  Cholelithiasis.  Stable. Bilateral nephrolithiasis.  Stable. Hypokalemia.  Potassium supplement and follow-up level with PCP.   Hypomagnesemia.  Improved. Generalized weakness.  Home health and PT. DISCHARGE CONDITIONS:  Stable, discharged to home with home health and PT. CONSULTS OBTAINED:  Treatment Team:  Tsosie Billing, MD DRUG ALLERGIES:   Allergies  Allergen Reactions  . Ciprofloxacin Other (See Comments)    PER HEART DR   DISCHARGE MEDICATIONS:   Allergies as of 03/10/2019      Reactions   Ciprofloxacin Other (See Comments)   PER HEART DR      Medication List    STOP taking these medications   senna-docusate 8.6-50 MG tablet Commonly known as: Senokot-S     TAKE these medications   amiodarone 200 MG tablet Commonly known as: PACERONE Take 1 tablet (200 mg total) by mouth daily.   apixaban 5 MG Tabs tablet Commonly known as: Eliquis Take 1 tablet (5 mg total) by mouth 2 (two) times daily.   omeprazole 40 MG capsule Commonly known as: PRILOSEC Take 1 capsule (40 mg total) by mouth daily.   rosuvastatin 40 MG tablet Commonly known as: CRESTOR Take 1 tablet (40 mg total) by mouth daily.   tamsulosin 0.4 MG Caps capsule Commonly known as: FLOMAX Take 1 capsule (0.4 mg total) by mouth daily after supper.   TYLENOL 500 MG tablet Generic drug: acetaminophen Take 1,000 mg by mouth every 6 (six) hours as needed for mild pain (for pain).        DISCHARGE INSTRUCTIONS:  See AVS.  If you experience worsening of your admission symptoms, develop shortness of breath, life threatening emergency, suicidal or homicidal thoughts you must seek medical attention immediately by calling 911 or calling your MD immediately  if symptoms less severe.  You Must read complete instructions/literature along with all the possible adverse  reactions/side effects for all the Medicines you take and that have been prescribed to you. Take any new Medicines after you have completely understood and accpet all the possible adverse reactions/side effects.   Please note  You were cared for by a hospitalist during your hospital stay. If you have any questions about your discharge medications or the care you received while you were in the hospital after you are discharged, you can call the unit and asked to speak with the hospitalist on call if the hospitalist that took care of you is not available. Once you are discharged, your primary care physician will handle any further medical issues. Please note that NO REFILLS for any discharge medications will be authorized once you are discharged, as it is imperative that you return to your primary care physician (or establish a relationship with a primary care physician if you do not have one) for your aftercare needs so that they can reassess your need for medications and monitor your lab values.    On the day of Discharge:  VITAL SIGNS:  Blood pressure 135/70, pulse 71, temperature 98.6 F (37 C), temperature  source Oral, resp. rate 18, height 5\' 8"  (1.727 m), weight 71 kg, SpO2 97 %. PHYSICAL EXAMINATION:  GENERAL:  83 y.o.-year-old patient lying in the bed with no acute distress.  EYES: Pupils equal, round, reactive to light and accommodation. No scleral icterus. Extraocular muscles intact.  HEENT: Head atraumatic, normocephalic. Oropharynx and nasopharynx clear.  NECK:  Supple, no jugular venous distention. No thyroid enlargement, no tenderness.  LUNGS: Normal breath sounds bilaterally, no wheezing, rales,rhonchi or crepitation. No use of accessory muscles of respiration.  CARDIOVASCULAR: S1, S2 normal. No murmurs, rubs, or gallops.  ABDOMEN: Soft, non-tender, non-distended. Bowel sounds present. No organomegaly or mass.  EXTREMITIES: No pedal edema, cyanosis, or clubbing.  NEUROLOGIC:  Cranial nerves II through XII are intact. Muscle strength 4/5 in all extremities. Sensation intact. Gait not checked.  PSYCHIATRIC: The patient is alert and oriented x 3.  SKIN: No obvious rash, lesion, or ulcer.  DATA REVIEW:   CBC Recent Labs  Lab 03/09/19 0624  WBC 12.2*  HGB 9.3*  HCT 29.4*  PLT 193    Chemistries  Recent Labs  Lab 03/08/19 0822  03/10/19 0922  NA 140   < > 141  K 3.8   < > 3.3*  CL 108   < > 110  CO2 22   < > 22  GLUCOSE 105*   < > 130*  BUN 19   < > 16  CREATININE 1.63*   < > 1.29*  CALCIUM 8.8*   < > 8.2*  MG  --    < > 1.9  AST 19  --   --   ALT 20  --   --   ALKPHOS 161*  --   --   BILITOT 0.4  --   --    < > = values in this interval not displayed.     Microbiology Results  Results for orders placed or performed during the hospital encounter of 03/08/19  Urine culture     Status: Abnormal   Collection Time: 03/08/19  9:01 AM   Specimen: Urine, Random  Result Value Ref Range Status   Specimen Description   Final    URINE, RANDOM Performed at [facility], [address] [place], [place] [address]    Special Requests   Final    NONE Performed at [facility], [address] [place],  [address]    Culture 20,000 COLONIES/mL YEAST (A)  Final   Report Status 03/09/2019 FINAL  Final  SARS Coronavirus 2 [facility] order, Performed in [facility] hospital lab) Nasopharyngeal     Status: None   Collection Time: 03/08/19  9:01 AM   Specimen: Nasopharyngeal  Result Value Ref Range Status   SARS Coronavirus 2 NEGATIVE NEGATIVE Final    Comment: (NOTE) If result is NEGATIVE SARS-CoV-2 target nucleic acids are NOT DETECTED. The SARS-CoV-2 RNA is generally detectable in upper and lower  respiratory specimens during the acute phase of infection. The lowest  concentration of SARS-CoV-2 viral copies this assay can detect is 250  copies / mL. A negative result does not preclude SARS-CoV-2 infection  and should not be  used as the sole basis for treatment or other  patient management decisions.  A negative result may occur with  improper specimen collection / handling, submission of specimen other  than nasopharyngeal swab, presence of viral mutation(s) within the  areas targeted by this assay, and inadequate number of viral copies  (<250 copies / mL). A negative  result must be combined with clinical  observations, patient history, and epidemiological information. If result is POSITIVE SARS-CoV-2 target nucleic acids are DETECTED. The SARS-CoV-2 RNA is generally detectable in upper and lower  respiratory specimens dur ing the acute phase of infection.  Positive  results are indicative of active infection with SARS-CoV-2.  Clinical  correlation with patient history and other diagnostic information is  necessary to determine patient infection status.  Positive results do  not rule out bacterial infection or co-infection with other viruses. If result is PRESUMPTIVE POSTIVE SARS-CoV-2 nucleic acids MAY BE PRESENT.   A presumptive positive result was obtained on the submitted specimen  and confirmed on repeat testing.  While 2019 novel coronavirus  (SARS-CoV-2) nucleic acids may be present in the submitted sample  additional confirmatory testing may be necessary for epidemiological  and / or clinical management purposes  to differentiate between  SARS-CoV-2 and other Sarbecovirus currently known to infect humans.  If clinically indicated additional testing with an alternate test  methodology [phone]) is advised. The SARS-CoV-2 RNA is generally  detectable in upper and lower respiratory sp ecimens during the acute  phase of infection. The expected result is Negative. Fact Sheet for Patients:  StrictlyIdeas.no Fact Sheet for Healthcare Providers: BankingDealers.co.za This test is not yet approved or cleared by the [place] FDA and has been authorized  for detection and/or diagnosis of SARS-CoV-2 by FDA under an Emergency Use Authorization (EUA).  This EUA will remain in effect (meaning this test can be used) for the duration of the COVID-19 declaration under Section 564(b)(1) of the Act, 21 U.S.C. section 360bbb-3(b)(1), unless the authorization is terminated or revoked sooner. Performed at [facility], [address] [place], [place] [address]     RADIOLOGY:  No results found.   Management plans discussed with the patient, family and they are in agreement.  CODE STATUS: Full Code   TOTAL TIME TAKING CARE OF THIS PATIENT: 32 minutes.    Demetrios Loll M.D on 03/10/2019 at 9:49 AM  Between 7am to 6pm - Pager - [phone]  After 6pm go to [email or web address] - [employer] [facility]  Office  [phone]  CC: Primary care physician; [name], MD   Note: This dictation was prepared with Dragon dictation along with smaller phrase technology. Any transcriptional errors that result from this process are unintentional.

## 2019-03-10 NOTE — Progress Notes (Signed)
Carl [name]  A and O x 4. VSS. Pt tolerating diet well. No complaints of pain or nausea. IV removed intact, prescriptions given. Pt voiced understanding of discharge instructions with no further questions. Pt discharged via wheelchair with NT.    Allergies as of 03/10/2019      Reactions   Ciprofloxacin Other (See Comments)   PER HEART DR      Medication List    STOP taking these medications   senna-docusate 8.6-50 MG tablet Commonly known as: Senokot-S     TAKE these medications   amiodarone 200 MG tablet Commonly known as: PACERONE Take 1 tablet (200 mg total) by mouth daily.   apixaban 5 MG Tabs tablet Commonly known as: Eliquis Take 1 tablet (5 mg total) by mouth 2 (two) times daily. Notes to patient: 03/10/19   omeprazole 40 MG capsule Commonly known as: PRILOSEC Take 1 capsule (40 mg total) by mouth daily.   rosuvastatin 40 MG tablet Commonly known as: CRESTOR Take 1 tablet (40 mg total) by mouth daily.   tamsulosin 0.4 MG Caps capsule Commonly known as: FLOMAX Take 1 capsule (0.4 mg total) by mouth daily after supper. Notes to patient: 03/10/19   TYLENOL 500 MG tablet Generic drug: acetaminophen Take 1,000 mg by mouth every 6 (six) hours as needed for mild pain (for pain).       Vitals:   03/09/19 2049 03/10/19 0558  BP: 113/66 135/70  Pulse: 71 71  Resp: 18 18  Temp: 98.5 F (36.9 C) 98.6 F (37 C)  SpO2: 96% 97%    Francesco Sor

## 2019-03-14 ENCOUNTER — Ambulatory Visit: Payer: Self-pay | Admitting: Family Medicine

## 2019-03-15 NOTE — Progress Notes (Deleted)
Cardiology Office Note    Date:  03/15/2019   ID:  Carl [name], DOB March 26, 1936, MRN [id number]  PCP:  Carl Haven, MD  Cardiologist:  Carl Rogue, MD  Electrophysiologist:  None   Chief Complaint: ***  History of Present Illness:   ROBT [name] is a 83 y.o. male with history of CAD status post 5 vessel CABG in 2006, HFrEF secondary to ICM with subsequent recovery of LV systolic function, PAF on Eliquis, atrial flutter in 02/2017, moderate mitral vegetation, AAA, CKD stage III, nephrolithiasis, anemia of chronic disease, pulmonary nodule, HTN, HLD, orthostatic hypotension, and GERD who presents for follow-up of ***.  Patient was previously followed by Carl [name]. He underwent 5 vessel CABG in 2006 with LIMA to LAD, SVG to ramus intermedius, SVG to OM, and sequential SVG to RPL and RPDA.  Echo from 2013 showed an EF of 45-50%, AK of the septal myocardium, Gr1DD, mild MR, moderately dilated LA, mildly dilated RA, mildly elevated PASP. Repeat echo in 2016 showed an improved EF to 60-65%, HK of the mid anteroseptal and distal septal wall, mild focal basal hypertrophy of the septum, Gr1DD, mild MR, mildly dilated LA, mildly dilated RV, trivial TR. Echo in 08/2016 showed an EF of 50-55%, moderate LVH, moderate MR, severe LAE, moderately dilated RV, PASP 62 mmHg. He was followed closely in 2018 due to his PAF s/p DCCV in 10/2016, with subsequent medication tapering secondary to orthostatic hypotension. He ultimately required discontinuation of beta blocker and Flomax with initiation of midodrine. In 02/2017, he was noted to be in new onset atrial flutter in the setting of a UTI. At that time he was also noted to have a newly noted pulmonary nodule measuring 7 mm most suggestive of granuloma.  In late 2019 he was experiencing unintentional weight loss with CT of the chest/abdomen/pelvis in 05/2018 being unrevealing.  However, a 3.1 cm fusiform abdominal aortic aneurysm was incidentally  noted with recommended follow-up in 3 years.  He was seen in 06/2018 for follow-up and was doing reasonably well.  Echocardiogram was recommended given his history of ischemic cardiomyopathy though was not completed.  He was admitted in 09/2018 with weakness and mechanical fall with a finding of mildly impacted acute fracture of the mid right femoral neck.  CT of the head showed an epidural hematoma versus loculated subdural hematoma without mass-effect or acute bleeding.  Patient was seen by orthopedics and subsequently underwent left hip surgery.  He was also evaluated by neurosurgery with recommendation for holding Eliquis until cleared by neurosurgery.  Follow-up head CT showed an unchanged, small, partially calcified lentiform extra-axial lesion of the left frontal lobe which is felt to be a small meningioma versus a small chronic hemorrhage.  Notes indicate the patient was apparently placed back on Eliquis well living at a skilled nursing facility though following discharge from this facility he was not taking it when he was seen in virtual follow-up in 11/2018.  After cardiology discussed the case with neurosurgery it was felt the patient was okay to resume Eliquis in 11/2018.  Patient was seen on 02/25/2019 in the ED with UTI and 3 falls in a 1 week time span.  He denied hitting his head.  CT head at that time showed atrophy with chronic microvascular ischemia with no acute infarct or hemorrhage.  A 10 x 20 mm extra-axial mass left frontal region was unchanged and felt to most likely represent a meningioma.  He was subsequently admitted to the  hospital in early 02/2019 with altered mental status secondary to UTI.  Repeat CT head showed atrophy with chronic microvascular disease and no acute intracranial abnormality.  Patient was most recently admitted to the hospital from 8/11 through 8/13 in the setting of altered mental status stemming from persistent UTI with repeat CT head showing no acute intracranial  findings and a stable small left frontal extra-axial mass felt to represent meningioma as outlined above.  CT abdomen pelvis during that admission showed a 3.3 cm infrarenal AAA with recommended follow-up in 3 years time as well as stable bilateral nephrolithiasis without hydronephrosis.  High-sensitivity troponin troponin was checked for uncertain reasons and found to have an initial value of 10 without delta being obtained.  During his most recent admission EKG was not obtained.  ***   Labs: 02/2019 - magnesium 1.9, potassium 3.3, serum creatinine 1.29, WBC 12.2, Hgb 9.3, PLT 193, AST/ALT normal, albumin 3.1 06/2018 - total cholesterol 130, triglycerides 65, HDL 62, LDL 55 05/2018 - A1c 5.2   Past Medical History:  Diagnosis Date   (HFpEF) heart failure with preserved ejection fraction ([place])    a. 08/2016 Echo: EF 50-55%.   AAA (abdominal aortic aneurysm) ([place])    CT abd. dated 06-18-2018 in epic,  3.1cm;  followed by pcp   Arthritis    arms   Bladder calculus    Chronic anemia    Chronic anxiety    CKD (chronic kidney disease), stage III ([place])    Coronary artery disease CARDIOLOGIST-  DR [name]   a. CABG 2006 with  LIMA-LAD, SVG-Ramus, SVG-OM, seq SVG-PLV-PDA.   DOE (dyspnea on exertion)    GERD (gastroesophageal reflux disease)    Hematuria    History of bladder stone    History of non-ST elevation myocardial infarction (NSTEMI) 03/30/2005   post op recovery inguinal hernia repair   History of recurrent UTIs    last admission 08-04-2018 pseudomonas UTI   Hyperlipidemia    Hyperplasia of prostate without lower urinary tract symptoms (LUTS)    Hypertension    Ischemic cardiomyopathy    a. echo (01/13) 45-50% septal akinesis, mild MR; b. Echo  (01/16) ef 60-65%, mid-apical anteroseptal hypokinesis; c. echo 09-16-2016 ef 50-55%   Left ureteral stone    Lung nodule    noted 08/ 2018;  followed by pcp   Moderate mitral regurgitation    a.08/2016 Echo: Mod  MR/TR.   Orthostatic hypotension    PAC (premature atrial contraction)    Paroxysmal atrial flutter ([place])    a. dx 08/2016, started on amiodarone.   Persistent atrial fibrillation followed by cardiologist-- dr Rockey Situ   a. dx AFib 08/2016 s/p DCCV 10/2016-->eliquis (CHA2DS2VASc = 5).   Pulmonary hypertension ([place])    last echo in epic 09-16-2016   Recurrent prostate cancer ([place]) UROLOGIST-- DR BELL   s/p  radioactive prostate seed implants in 1997;    due to PSA rising pt has been doing lupron injection's few past several yrs at dr bell office   Renal calculi    bilateral per CT 06-18-2018   S/P CABG x 5 04/04/2005   LIMA to LAD,  SVG to Ramus,  SVG to OM,  seqSVG to PLV and PDA   Wears glasses     Past Surgical History:  Procedure Laterality Date   CARDIAC CATHETERIZATION  04/02/2005   dr Doreatha Lew    Critical three-vessel coronary disease --  Essentially normal left ventricular function , ef 55%   CARDIOVASCULAR STRESS TEST  05-01-2008  dr Doreatha Lew /  dr harding   Low risk nuclear study w/ no ischemia or scar but flattened septal motion/  ef 64%   CARDIOVERSION N/A 11/19/2016   Procedure: CARDIOVERSION;  Surgeon: Jerline Pain, MD;  Location: [facility];  Service: Cardiovascular;  Laterality: N/A;   COLONOSCOPY N/A 10/23/2016   Procedure: COLONOSCOPY;  Surgeon: Milus Banister, MD;  Location: [facility];  Service: Endoscopy;  Laterality: N/A;   CORONARY ARTERY BYPASS GRAFT  04/04/2005   dr  Ricard Dillon   LIMA - LAD,  SVG - Ramus, SVG - OM,  seqSVG - PLV and PDA   CYSTOSCOPY WITH LITHOLAPAXY N/A 05/07/2016   Procedure: CYSTOSCOPY WITH LITHOLAPAXY;  Surgeon: Rana Snare, MD;  Location: [facility];  Service: Urology;  Laterality: N/A;   CYSTOSCOPY WITH RETROGRADE PYELOGRAM, URETEROSCOPY AND STENT PLACEMENT  09/03/2018   Procedure: CYSTOSCOPY WITH RETROGRADE PYELOGRAM, URETEROSCOPY LASER LITHOTRIPSY AND STENT PLACEMENT;  Surgeon: Lucas Mallow, MD;   Location: [facility];  Service: Urology;;   HOLMIUM LASER APPLICATION N/A 93/81/8299   Procedure: HOLMIUM LASER APPLICATION;  Surgeon: Rana Snare, MD;  Location: [facility];  Service: Urology;  Laterality: N/A;   INGUINAL HERNIA REPAIR Left 03-30-2005  dr Excell Seltzer   incarcerated    [place] Right 10/07/2018   Procedure: TOTAL HIP ARTHROPLASTY ANTERIOR APPROACH;  Surgeon: Rod Can, MD;  Location: [facility];  Service: Orthopedics;  Laterality: Right;   TRANSTHORACIC ECHOCARDIOGRAM  08/24/2014   mid anteroseptal and distal septak hypokinetic,  mild focal basal LVH, ef 60-65%,  grade 1 diastolic dysfunction/  mild MR/  mild LAE/  mild dilated RV with normal RVSP/  trivial TR    Current Medications: No outpatient medications have been marked as taking for the 03/18/19 encounter (Appointment) with Rise Mu, PA-C.     Allergies:   Ciprofloxacin   Social History   Socioeconomic History   Marital status: Widowed    Spouse name: Not on file   Number of children: 3   Years of education: Not on file   Highest education level: Not on file  Occupational History   Occupation: retired  Scientist, product/process development strain: Not on file   Food insecurity    Worry: Not on file    Inability: Not on Lexicographer needs    Medical: Not on file    Non-medical: Not on file  Tobacco Use   Smoking status: Former Smoker    Years: 2.00    Types: Cigarettes    Quit date: 07/28/1960    Years since quitting: 58.6   Smokeless tobacco: Never Used  Substance and Sexual Activity   Alcohol use: No   Drug use: No   Sexual activity: Never  Lifestyle   Physical activity    Days per week: 0 days    Minutes per session: Not on file   Stress: Only a little  Relationships   Press photographer on phone: Not on file    Gets together: Not on file    Attends religious service: Not on file     Active member of club or organization: Not on file    Attends meetings of clubs or organizations: Not on file    Relationship status: Not on file  Other Topics Concern   Not on file  Social History Narrative   Widowed lives with daughter  [name]     Family History:  The patient's family history includes Cancer in his brother, father, and mother; Heart disease in his mother.  ROS:   ROS   EKGs/Labs/Other Studies Reviewed:    Studies reviewed were summarized above. The additional studies were reviewed today:  2D Echo 08/2016: - Left ventricle: The cavity size was normal. Wall thickness was   increased in a pattern of moderate LVH. Systolic function was   normal. The estimated ejection fraction was in the range of 50%   to 55%. The study is not technically sufficient to allow   evaluation of LV diastolic function. - Ventricular septum: Septal motion showed paradox. - Mitral valve: There was moderate regurgitation. - Left atrium: The appendage was severely dilated. - Right ventricle: The cavity size was moderately dilated. - Right atrium: The atrium was moderately dilated. - Atrial septum: No defect or patent foramen ovale was identified. - Tricuspid valve: There was moderate regurgitation. - Pulmonary arteries: PA peak pressure: 62 mm Hg (S). __________  Holter 02/2017:  Typical atrial flutter with occasional RVR  Seeing in hospital setting currently.  Converted on amiodarone.   EKG:  EKG is ordered today.  The EKG ordered today demonstrates ***  Recent Labs: 06/18/2018: TSH 1.03 10/05/2018: B Natriuretic Peptide 181.6 03/08/2019: ALT 20 03/09/2019: Hemoglobin 9.3; Platelets 193 03/10/2019: BUN 16; Creatinine, Ser 1.29; Magnesium 1.9; Potassium 3.3; Sodium 141  Recent Lipid Panel    Component Value Date/Time   CHOL 130 07/12/2018 1148   TRIG 65 07/12/2018 1148   HDL 62 07/12/2018 1148   CHOLHDL 2.1 07/12/2018 1148   CHOLHDL 3.1 03/13/2017 0451   VLDL 16 03/13/2017  0451   LDLCALC 55 07/12/2018 1148    PHYSICAL EXAM:    VS:  There were no vitals taken for this visit.  BMI: There is no height or weight on file to calculate BMI.  Physical Exam  Wt Readings from Last 3 Encounters:  03/08/19 156 lb 8.4 oz (71 kg)  03/04/19 156 lb 8.4 oz (71 kg)  12/16/18 149 lb 7 oz (67.8 kg)     ASSESSMENT & PLAN:   1. ***  Disposition: F/u with Dr. Rockey Situ or an APP in ***.   Medication Adjustments/Labs and Tests Ordered: Current medicines are reviewed at length with the patient today.  Concerns regarding medicines are outlined above. Medication changes, Labs and Tests ordered today are summarized above and listed in the Patient Instructions accessible in Encounters.   Signed, [name], PA-C 03/15/2019 7:25 AM     [facility] [address] [place], [place] [address] [phone]

## 2019-03-18 ENCOUNTER — Ambulatory Visit: Payer: Medicare Other | Admitting: Physician Assistant

## 2019-03-28 ENCOUNTER — Telehealth: Payer: Self-pay | Admitting: Family Medicine

## 2019-03-28 NOTE — Telephone Encounter (Signed)
Pt's daughter [name] stated pt is in the doughnut hole and cannot afford his apixaban (ELIQUIS) 5 MG TABS tablet She would like to know if Dr. [name] can send in a cheaper alternative. Please advise.  [id number] [address], [place] [phone] (Phone) [phone] (Fax)

## 2019-03-28 NOTE — Telephone Encounter (Signed)
Pt's daughter [name] stated pt is in the doughnut hole and cannot afford his apixaban (ELIQUIS) 5 MG TABS tablet She would like to know if Dr. [name] can send in a cheaper alternative. Please advise.  [id number] [address], [place] [phone] (Phone) [phone] (Fax)

## 2019-03-28 NOTE — Telephone Encounter (Signed)
Pt's daughter [name] stated pt is in the doughnut hole and cannot afford his apixaban (ELIQUIS) 5 MG TABS tablet She would like to know if Dr. [name] can send in a cheaper alternative. Please advise.  [id number] [address], [place] [phone] (Phone) [phone] (Fax)

## 2019-03-28 NOTE — Telephone Encounter (Signed)
Please see if he would be willing to meet with Catie virtually to see if we can get him assistance with the eliquis. It would be the best option as the cheaper alternative would be coumadin and would require frequent lab checks to ensure that he was in an appropriate range.

## 2019-03-29 ENCOUNTER — Telehealth: Payer: Self-pay | Admitting: Family Medicine

## 2019-03-29 ENCOUNTER — Telehealth: Payer: Self-pay | Admitting: Pharmacist

## 2019-03-29 DIAGNOSIS — I4891 Unspecified atrial fibrillation: Secondary | ICD-10-CM

## 2019-03-29 DIAGNOSIS — I25810 Atherosclerosis of coronary artery bypass graft(s) without angina pectoris: Secondary | ICD-10-CM

## 2019-03-29 DIAGNOSIS — Z951 Presence of aortocoronary bypass graft: Secondary | ICD-10-CM

## 2019-03-29 NOTE — Telephone Encounter (Signed)
Per Dr [name] would like for pt to have an appt with you. apixaban (ELIQUIS) 5 MG TABS tablet pt goes in a donut hole every year. Please advise to sch. Thank you!  [name] daughter call @ [phone].

## 2019-03-29 NOTE — Telephone Encounter (Signed)
Catie this patient Mr. Lopezgarcia cannot afford his medication eliquis and Dr. [name] would like for you to meet with him on  Telephone or virtual visit for assistance with this medication, his daughter handled his things, her number is [phone] can you call and set that up for them.  Nina,cma

## 2019-03-29 NOTE — Telephone Encounter (Signed)
I placed CCM referral, will outreach daughter on Thursday.

## 2019-03-29 NOTE — Telephone Encounter (Signed)
error 

## 2019-03-29 NOTE — Telephone Encounter (Signed)
CCM referral placed. Will outreach

## 2019-03-31 ENCOUNTER — Ambulatory Visit: Payer: Medicare Other | Admitting: Pharmacist

## 2019-03-31 DIAGNOSIS — I25810 Atherosclerosis of coronary artery bypass graft(s) without angina pectoris: Secondary | ICD-10-CM

## 2019-03-31 DIAGNOSIS — S72002D Fracture of unspecified part of neck of left femur, subsequent encounter for closed fracture with routine healing: Secondary | ICD-10-CM

## 2019-03-31 DIAGNOSIS — I4891 Unspecified atrial fibrillation: Secondary | ICD-10-CM

## 2019-03-31 NOTE — Chronic Care Management (AMB) (Signed)
Chronic Care Management   Note  03/31/2019 Name: Carl [name] MRN: [id number] DOB: Jul 03, 1936   Subjective:  Carl [name] is a 83 y.o. year old male who is a primary care patient of [name], [name], MD. The CCM team was consulted for assistance with chronic disease management and care coordination needs.    Spoke with patient's daughter, Carl [name], today.    Carl [name]'s daughter was given information about Chronic Care Management services today including:  1. CCM service includes personalized support from designated clinical staff supervised by his physician, including individualized plan of care and coordination with other care providers 2. 24/7 contact phone numbers for assistance for urgent and routine care needs. 3. Service will only be billed when office clinical staff spend 20 minutes or more in a month to coordinate care. 4. Only one practitioner may furnish and bill the service in a calendar month. 5. The patient may stop CCM services at any time (effective at the end of the month) by phone call to the office staff. 6. The patient will be responsible for cost sharing (co-pay) of up to 20% of the service fee (after annual deductible is met).  Patient's daughter  agreed to services and verbal consent obtained.   Review of patient status, including review of consultants reports, laboratory and other test data, was performed as part of comprehensive evaluation and provision of chronic care management services.   Objective:  Lab Results  Component Value Date   CREATININE 1.29 (H) 03/10/2019   CREATININE 1.16 03/09/2019   CREATININE 1.63 (H) 03/08/2019    Lab Results  Component Value Date   HGBA1C 5.2 06/18/2018       Component Value Date/Time   CHOL 130 07/12/2018 1148   TRIG 65 07/12/2018 1148   HDL 62 07/12/2018 1148   CHOLHDL 2.1 07/12/2018 1148   CHOLHDL 3.1 03/13/2017 0451   VLDL 16 03/13/2017 0451   LDLCALC 55 07/12/2018 1148    Clinical ASCVD:  Yes - CAD, s/p NSTEMI, CABG in 2006   BP Readings from Last 3 Encounters:  03/10/19 135/70  03/04/19 137/71  02/25/19 (!) 158/79    Allergies  Allergen Reactions   Ciprofloxacin Other (See Comments)    PER HEART DR    Medications Reviewed Today    Reviewed by De Hollingshead, [facility] (Pharmacist) on 03/31/19 at 1307  Med List Status: <None>  Medication Order Taking? Sig Documenting Provider Last Dose Status Informant  acetaminophen (TYLENOL) 500 MG tablet [id number] Yes Take 1,000 mg by mouth every 6 (six) hours as needed for mild pain (for pain).  [provider] Taking Active Child  amiodarone (PACERONE) 200 MG tablet [id number] Yes Take 1 tablet (200 mg total) by mouth daily. Rise Mu, PA-C Taking Active Child  apixaban (ELIQUIS) 5 MG TABS tablet [id number] Yes Take 1 tablet (5 mg total) by mouth 2 (two) times daily. [name], NP Taking Active Child  omeprazole (PRILOSEC) 40 MG capsule [id number] Yes Take 1 capsule (40 mg total) by mouth daily. [name], MD Taking Active Child  rosuvastatin (CRESTOR) 40 MG tablet [id number] Yes Take 1 tablet (40 mg total) by mouth daily. [name], [name], MD Taking Active Child  tamsulosin (FLOMAX) 0.4 MG CAPS capsule [id number] No Take 1 capsule (0.4 mg total) by mouth daily after supper.  Patient not taking: Reported on 03/31/2019   [name], MD Not Taking Active Child           Med  Note ([name] E   Thu Mar 31, 2019  1:06 PM)             Assessment:   Goals Addressed            This Visit's Progress     Patient Stated    "We can't afford his medication" (pt-stated)       Current Barriers:   Financial Barriers: patient has [employer] and reports copay for Eliquis is cost prohibitive at this time, now that patient is in the [employer] Coverage Gap His daughter notes that they have been without Eliquis for ~3 days; she has been giving him 1 aspirin instead o Denies  cost concerns with other medications o Daughter Carl [name] lives with him, helps with ADLs (dressing, bathing, cooking and cleaning); however, she notes she is also disabled and it is difficult to do this for her father o Reports problems with transportation.  Pharmacist Clinical Goal(s):   Over the next 30 days, patient will work with PharmD and providers to relieve medication access concerns  Interventions:  Comprehensive medication review completed; medication list updated in electronic medical record.   Carl [name] is not a financial dependent of her father; reviewing his income as a 1 person household, appears he will qualify for Medicare Extra Help. With permission, collaboratively completed Medicare Extra Help application online today. Carl [name] is aware that the decision will arrive in the mail in 2-6 weeks  Reviewed income and projected out of pocket spend - appears he will qualify for Eliquis patient assistance. Will collaborate with Dr. Donivan Scull office for signature on application.   Prepared Eliquis 5 mg samples - #56; 4 week supply. Patient's grandson is going to come pick them up this afternoon. He will also take application to his grandfather for signature. Patient's daughter will prepare proof of income and collect [facility] of [facility] to submit with application  Placing Connected Care referral for transportation help, and potentially in-home care help. May require SW support. Will investigate how to provide this, as patient is not eligible for [facility] CM services.   Patient Self Care Activities:   Patient will provide necessary portions of application   Initial goal documentation        Plan: - Will collaborate with patient/caregivers and interdisciplinary team as above  [name], PharmD, [place] Pharmacist [place] [phone]

## 2019-03-31 NOTE — Patient Instructions (Signed)
Visit Information  Goals Addressed            This Visit's Progress     Patient Stated   . "We can't afford his medication" (pt-stated)       Current Barriers:  . Financial Barriers: patient has [employer] and reports copay for Eliquis is cost prohibitive at this time, now that patient is in the [employer] Coverage Gap His daughter notes that they have been without Eliquis for ~3 days; she has been giving him 1 aspirin instead o Denies cost concerns with other medications o Daughter [name] lives with him, helps with ADLs (dressing, bathing, cooking and cleaning); however, she notes she is also disabled and it is difficult to do this for her father o Reports problems with transportation.  Pharmacist Clinical Goal(s):  [name] Over the next 30 days, patient will work with PharmD and providers to relieve medication access concerns  Interventions: . Comprehensive medication review completed; medication list updated in electronic medical record.  [name] is not a financial dependent of her father; reviewing his income as a 1 person household, appears he will qualify for Medicare Extra Help. With permission, collaboratively completed Medicare Extra Help application online today. [name] is aware that the decision will arrive in the mail in 2-6 weeks . Reviewed income and projected out of pocket spend - appears he will qualify for Eliquis patient assistance. Will collaborate with Dr. Donivan Scull office for signature on application.  [name] Prepared Eliquis 5 mg samples - #56; 4 week supply. Patient's grandson is going to come pick them up this afternoon. He will also take application to his grandfather for signature. Patient's daughter will prepare proof of income and collect out of [facility] spend report [facility] to submit with application . Placing Connected Care referral for transportation help, and potentially in-home care help. May require SW support. Will investigate how to provide this, as  patient is not eligible for [facility] CM services.   Patient Self Care Activities:  . Patient will provide necessary portions of application   Initial goal documentation        Mr. Erker was given information about Chronic Care Management services today including:  1. CCM service includes personalized support from designated clinical staff supervised by his physician, including individualized plan of care and coordination with other care providers 2. 24/7 contact phone numbers for assistance for urgent and routine care needs. 3. Service will only be billed when office clinical staff spend 20 minutes or more in a month to coordinate care. 4. Only one practitioner may furnish and bill the service in a calendar month. 5. The patient may stop CCM services at any time (effective at the end of the month) by phone call to the office staff. 6. The patient will be responsible for cost sharing (co-pay) of up to 20% of the service fee (after annual deductible is met).  Patient agreed to services and verbal consent obtained.   The patient verbalized understanding of instructions provided today and declined a print copy of patient instruction materials.   Plan: - Will collaborate with patient/caregivers and interdisciplinary team as above  [name], PharmD, [place] Pharmacist [place] [phone]

## 2019-04-08 ENCOUNTER — Telehealth: Payer: Self-pay

## 2019-04-08 NOTE — Telephone Encounter (Signed)
Copied from [place] [phone]. Topic: Referral - Status >> Apr 08, 2019  [id number] PM Simone Curia D wrote: [id number] Unable to leave voicemail message regarding transportation and in home aide for daughter Eliyas Noor per [facility] voicemail not set-up.  Will call again next week. Ambrose Mantle [phone]

## 2019-04-16 IMAGING — XA IR PICC >5YO
1 series · 1 of 1 positions shown · non-contrast
Comparison: none

INDICATION: Patient with history of cystitis; central venous access requested
for antibiotic therapy.

EXAM:
ULTRASOUND AND FLUOROSCOPIC GUIDED PICC LINE INSERTION
MEDICATIONS:
1% lidocaine to skin and subcutaneous tissue
ANESTHESIA/SEDATION:
None
FLUOROSCOPY TIME:  Fluoroscopy Time:  48 seconds (8 mGy).
COMPLICATIONS:
None immediate.
TECHNIQUE: The procedure, risks, benefits, and alternatives were explained to
the patient and informed written consent was obtained. A timeout was
performed prior to the initiation of the procedure.

[Series 300: ir picc placement right >5 yrs inc img g · 1 of 1 slices shown]
[im 1/1]
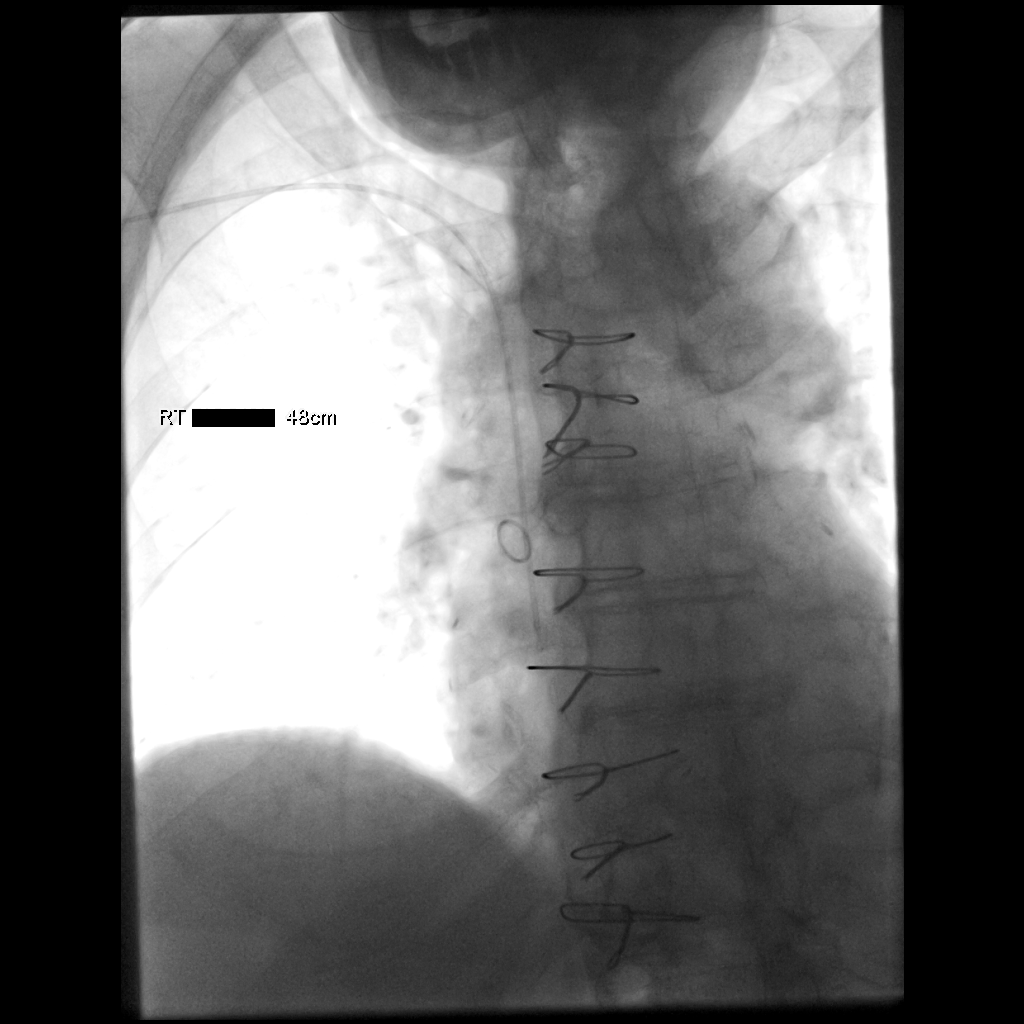

[1 of 1 positions shown; findings below may reference images not displayed]

The right upper extremity was prepped with chlorhexidine in a
sterile fashion, and a sterile drape was applied covering the
operative field. Maximum barrier sterile technique with sterile
gowns and gloves were used for the procedure. A timeout was
performed prior to the initiation of the procedure. Local anesthesia
was provided with 1% lidocaine.

Under direct ultrasound guidance, the right basilic vein was
accessed with a micropuncture kit after the overlying soft tissues
were anesthetized with 1% lidocaine. An ultrasound image was saved
for documentation purposes. A guidewire was advanced to the level of
the superior caval-atrial junction for measurement purposes and the
PICC line was cut to length. A peel-away sheath was placed and a 48
cm, 5 French, single lumen was inserted to level of the superior
caval-atrial junction. A post procedure spot fluoroscopic was
obtained. The catheter easily aspirated and flushed and was sutured
in place. PICC was capped. A dressing was placed. The patient
tolerated the procedure well without immediate post procedural
complication.
FINDINGS: After catheter placement, the tip lies within the superior caval
atrial junction the catheter aspirates and flushes normally and is
ready for immediate use.
IMPRESSION: Successful ultrasound and fluoroscopic guided placement of a right
basilic vein approach, 48 cm, 5 French, single lumen PICC with tip
at the superior caval-atrial junction. The PICC line is ready for
immediate use.

## 2019-04-18 ENCOUNTER — Ambulatory Visit: Payer: Self-pay | Admitting: Pharmacist

## 2019-04-18 DIAGNOSIS — I4891 Unspecified atrial fibrillation: Secondary | ICD-10-CM

## 2019-04-18 NOTE — Chronic Care Management (AMB) (Signed)
  Chronic Care Management   Follow Up Note   04/18/2019 Name: ABANOUB HANKEN MRN: [id number] DOB: [birth year]  Referred by: [name], MD Reason for referral : Chronic Care Management (Medication Management)   CHEE KINSLOW is a 83 y.o. year old male who is a primary care patient of [name], [name], MD. The CCM team was consulted for assistance with chronic disease management and care coordination needs.    Care coordination completed today.   Review of patient status, including review of consultants reports, relevant laboratory and other test results, and collaboration with appropriate care team members and the patient's provider was performed as part of comprehensive patient evaluation and provision of chronic care management services.    SDOH (Social Determinants of Health) screening performed today: Financial Strain . See Care Plan for related entries.   Advanced Directives Status: N See Care Plan and Vynca application for related entries.  Outpatient Encounter Medications as of 04/18/2019  Medication Sig Note  . acetaminophen (TYLENOL) 500 MG tablet Take 1,000 mg by mouth every 6 (six) hours as needed for mild pain (for pain).  03/31/2019: Taking 2 Q4-6 H  . amiodarone (PACERONE) 200 MG tablet Take 1 tablet (200 mg total) by mouth daily.   [name] apixaban (ELIQUIS) 5 MG TABS tablet Take 1 tablet (5 mg total) by mouth 2 (two) times daily.   [name] omeprazole (PRILOSEC) 40 MG capsule Take 1 capsule (40 mg total) by mouth daily.   . rosuvastatin (CRESTOR) 40 MG tablet Take 1 tablet (40 mg total) by mouth daily.   . tamsulosin (FLOMAX) 0.4 MG CAPS capsule Take 1 capsule (0.4 mg total) by mouth daily after supper. (Patient not taking: Reported on 03/31/2019)    No facility-administered encounter medications on file as of 04/18/2019.      Goals Addressed            This Visit's Progress     Patient Stated   . "We can't afford his medication" (pt-stated)       Current  Barriers:  . Financial Barriers: patient has [employer] and reports copay for Eliquis is cost prohibitive at this time, now that patient is in the [employer].  [name] Applied for Medicare Extra Help on 03/31/2019 . Decided to pursue BMS Patient Assistance for Eliquis in the meantime  Pharmacist Clinical Goal(s):  [name] Over the next 30 days, patient will work with PharmD and providers to relieve medication access concerns  Interventions: . Received patient and provider information, as well as patient financial information, and out of pocket spend. Unlikely patient will qualify at this time as he has not met close to 3% of total yearly income on prescription drugs, but will submit assistance application to determine how much more he will need to spend . Faxed completed application to [place]  Patient Self Care Activities:  . Patient will provide necessary portions of application   Please see past updates related to this goal by clicking on the "Past Updates" button in the selected goal          Plan:  - Will follow up with BMS in the next 3-5 business days for eligibility status.   [name], PharmD, [place] Pharmacist [facility] [phone]

## 2019-04-18 NOTE — Patient Instructions (Signed)
Visit Information  Goals Addressed            This Visit's Progress     Patient Stated   . "We can't afford his medication" (pt-stated)       Current Barriers:  . Financial Barriers: patient has [employer] and reports copay for Eliquis is cost prohibitive at this time, now that patient is in the [employer].  [name] Applied for Medicare Extra Help on 03/31/2019 . Decided to pursue BMS Patient Assistance for Eliquis in the meantime  Pharmacist Clinical Goal(s):  [name] Over the next 30 days, patient will work with PharmD and providers to relieve medication access concerns  Interventions: . Received patient and provider information, as well as patient financial information, and out of pocket spend. Unlikely patient will qualify at this time as he has not met close to 3% of total yearly income on prescription drugs, but will submit assistance application to determine how much more he will need to spend . Faxed completed application to [place]  Patient Self Care Activities:  . Patient will provide necessary portions of application   Please see past updates related to this goal by clicking on the "Past Updates" button in the selected goal         The patient verbalized understanding of instructions provided today and declined a print copy of patient instruction materials.   Plan:  - Will follow up with BMS in the next 3-5 business days for eligibility status.   [name], PharmD, [place] Pharmacist [facility] [place] [phone]

## 2019-04-21 ENCOUNTER — Telehealth: Payer: Self-pay

## 2019-04-21 NOTE — Telephone Encounter (Signed)
Copied from [place] [id number]. Topic: Referral - Status >> Apr 21, 2019  123456 PM Simone Curia D wrote: [id number] Spoke with patient's daughter Jahziel Lurry patient does not need transportation or in-home aide at this time she will call if the patient needs anything else. Ambrose Mantle [phone]

## 2019-04-22 ENCOUNTER — Ambulatory Visit: Payer: Self-pay | Admitting: Pharmacist

## 2019-04-22 DIAGNOSIS — I4891 Unspecified atrial fibrillation: Secondary | ICD-10-CM

## 2019-04-22 NOTE — Patient Instructions (Signed)
Visit Information  Goals Addressed            This Visit's Progress     Patient Stated   . "We can't afford his medication" (pt-stated)       Current Barriers:  . Financial Barriers: patient has [employer] and reports copay for Eliquis is cost prohibitive at this time, now that patient is in the [employer].  [name] Applied for Medicare Extra Help on 03/31/2019 . Decided to pursue BMS Patient Assistance for Eliquis in the meantime; submitted 04/18/2019  Pharmacist Clinical Goal(s):  [name] Over the next 30 days, patient will work with PharmD and providers to relieve medication access concerns  Interventions: . [place]. They denied patient, noting that they calculated his out of pocket spend requirement to be ~$900, which was far more than what I anticipated based on his submitted income. I noted this, and they said that they would re-run the application off of the submitted social security income. They recommended I call back next week to follow up.   Patient Self Care Activities:  . Patient will provide necessary portions of application   Please see past updates related to this goal by clicking on the "Past Updates" button in the selected goal         The patient verbalized understanding of instructions provided today and declined a print copy of patient instruction materials.   Plan:  - Will f/u with [place] next week  [place], PharmD, [place] Pharmacist [place] [phone]

## 2019-04-22 NOTE — Chronic Care Management (AMB) (Signed)
  Chronic Care Management   Follow Up Note   04/22/2019 Name: Carl [name] MRN: [id number] DOB: 03/05/1936  Referred by: [name], MD Reason for referral : Chronic Care Management (Medication Management)   Carl [name] is a 83 y.o. year old male who is a primary care patient of [name], [name], MD. The CCM team was consulted for assistance with chronic disease management and care coordination needs.    Care coordination completed today.   Review of patient status, including review of consultants reports, relevant laboratory and other test results, and collaboration with appropriate care team members and the patient's provider was performed as part of comprehensive patient evaluation and provision of chronic care management services.    SDOH (Social Determinants of Health) screening performed today: Financial Strain . See Care Plan for related entries.   Advanced Directives Status: N See Care Plan and Vynca application for related entries.  Outpatient Encounter Medications as of 04/22/2019  Medication Sig Note  . acetaminophen (TYLENOL) 500 MG tablet Take 1,000 mg by mouth every 6 (six) hours as needed for mild pain (for pain).  03/31/2019: Taking 2 Q4-6 H  . amiodarone (PACERONE) 200 MG tablet Take 1 tablet (200 mg total) by mouth daily.   [name] apixaban (ELIQUIS) 5 MG TABS tablet Take 1 tablet (5 mg total) by mouth 2 (two) times daily.   [name] omeprazole (PRILOSEC) 40 MG capsule Take 1 capsule (40 mg total) by mouth daily.   . rosuvastatin (CRESTOR) 40 MG tablet Take 1 tablet (40 mg total) by mouth daily.   . tamsulosin (FLOMAX) 0.4 MG CAPS capsule Take 1 capsule (0.4 mg total) by mouth daily after supper. (Patient not taking: Reported on 03/31/2019)    No facility-administered encounter medications on file as of 04/22/2019.      Goals Addressed            This Visit's Progress     Patient Stated   . "We can't afford his medication" (pt-stated)       Current  Barriers:  . Financial Barriers: patient has [employer] and reports copay for Eliquis is cost prohibitive at this time, now that patient is in the [employer].  [name] Applied for Medicare Extra Help on 03/31/2019 . Decided to pursue BMS Patient Assistance for Eliquis in the meantime; submitted 04/18/2019  Pharmacist Clinical Goal(s):  [name] Over the next 30 days, patient will work with PharmD and providers to relieve medication access concerns  Interventions: . [place]. They denied patient, noting that they calculated his out of pocket spend requirement to be ~$900, which was far more than what I anticipated based on his submitted income. I noted this, and they said that they would re-run the application off of the submitted social security income. They recommended I call back next week to follow up.   Patient Self Care Activities:  . Patient will provide necessary portions of application   Please see past updates related to this goal by clicking on the "Past Updates" button in the selected goal          Plan:  - Will f/u with [place] next week  [name], PharmD, [place] Pharmacist [employer] [phone]

## 2019-04-25 ENCOUNTER — Other Ambulatory Visit: Payer: Self-pay

## 2019-04-25 ENCOUNTER — Ambulatory Visit (INDEPENDENT_AMBULATORY_CARE_PROVIDER_SITE_OTHER): Payer: Medicare Other | Admitting: Family Medicine

## 2019-04-25 ENCOUNTER — Ambulatory Visit: Payer: Self-pay | Admitting: Pharmacist

## 2019-04-25 ENCOUNTER — Encounter: Payer: Self-pay | Admitting: Family Medicine

## 2019-04-25 ENCOUNTER — Ambulatory Visit (INDEPENDENT_AMBULATORY_CARE_PROVIDER_SITE_OTHER): Payer: Medicare Other

## 2019-04-25 VITALS — Ht 71.0 in | Wt 146.3 lb

## 2019-04-25 DIAGNOSIS — Z8546 Personal history of malignant neoplasm of prostate: Secondary | ICD-10-CM | POA: Diagnosis not present

## 2019-04-25 DIAGNOSIS — I4819 Other persistent atrial fibrillation: Secondary | ICD-10-CM

## 2019-04-25 DIAGNOSIS — M791 Myalgia, unspecified site: Secondary | ICD-10-CM | POA: Diagnosis not present

## 2019-04-25 DIAGNOSIS — Z0000 Encounter for general adult medical examination without abnormal findings: Secondary | ICD-10-CM | POA: Diagnosis not present

## 2019-04-25 DIAGNOSIS — E785 Hyperlipidemia, unspecified: Secondary | ICD-10-CM

## 2019-04-25 DIAGNOSIS — N390 Urinary tract infection, site not specified: Secondary | ICD-10-CM | POA: Insufficient documentation

## 2019-04-25 DIAGNOSIS — N3001 Acute cystitis with hematuria: Secondary | ICD-10-CM

## 2019-04-25 NOTE — Assessment & Plan Note (Signed)
Patient has been doing well.  No recurrent symptoms.  We will check a urinalysis with his labs.

## 2019-04-25 NOTE — Progress Notes (Signed)
Virtual Visit via telephone Note  This visit type was conducted due to national recommendations for restrictions regarding the COVID-19 pandemic (e.g. social distancing).  This format is felt to be most appropriate for this patient at this time.  All issues noted in this document were discussed and addressed.  No physical exam was performed (except for noted visual exam findings with Video Visits).   I connected with Carl [name] today at  9:00 AM EDT by telephone and verified that I am speaking with the correct person using two identifiers. Location patient: home Location provider: work  Persons participating in the virtual visit: patient, provider, daughter Ayven Cahan)  I discussed the limitations, risks, security and privacy concerns of performing an evaluation and management service by telephone and the availability of in person appointments. I also discussed with the patient that there may be a patient responsible charge related to this service. The patient expressed understanding and agreed to proceed.  Interactive audio and video telecommunications were attempted between this provider and patient, however failed, due to patient having technical difficulties OR patient did not have access to video capability.  We continued and completed visit with audio only.   Reason for visit: follow-up  HPI: Hyperlipidemia: Taking Crestor.  No chest pain or right upper quadrant pain.  He does note some myalgias in his back and neck and arms.  That has been going on a month.  Feels like it is in his muscles and joints.  Occurs intermittently.  A. fib: Taking amiodarone and Eliquis.  No palpitations.  No bleeding issues.  No orthopnea, PND, or edema.  History of prostate cancer: Patient reports he followed up with his urologist 3 to 4 months ago.  He reports his PSA was 2.8.  He wonders about checking this again.  UTI: Patient was hospitalized for metabolic encephalopathy related to a UTI  back in August.  He notes no recurrent symptoms related to this.  No dysuria, urinary frequency, fevers, or hematuria.  He notes chronic urinary urgency.  Denies abdominal pain.  Notes his urine has appeared a little orange at times.   ROS: See pertinent positives and negatives per HPI.  Past Medical History:  Diagnosis Date  . (HFpEF) heart failure with preserved ejection fraction ([place])    a. 08/2016 Echo: EF 50-55%.  [name] AAA (abdominal aortic aneurysm) ([place])    CT abd. dated 06-18-2018 in epic,  3.1cm;  followed by pcp  . Arthritis    arms  . Bladder calculus   . Chronic anemia   . Chronic anxiety   . CKD (chronic kidney disease), stage III ([place])   . Coronary artery disease CARDIOLOGIST-  DR Rockey Situ   a. CABG 2006 with  LIMA-LAD, SVG-Ramus, SVG-OM, seq SVG-PLV-PDA.  . DOE (dyspnea on exertion)   . GERD (gastroesophageal reflux disease)   . Hematuria   . History of bladder stone   . History of non-ST elevation myocardial infarction (NSTEMI) 03/30/2005   post op recovery inguinal hernia repair  . History of recurrent UTIs    last admission 08-04-2018 pseudomonas UTI  . Hyperlipidemia   . Hyperplasia of prostate without lower urinary tract symptoms (LUTS)   . Hypertension   . Ischemic cardiomyopathy    a. echo (01/13) 45-50% septal akinesis, mild MR; b. Echo  (01/16) ef 60-65%, mid-apical anteroseptal hypokinesis; c. echo 09-16-2016 ef 50-55%  . Left ureteral stone   . Lung nodule    noted 08/ 2018;  followed by pcp  .  Moderate mitral regurgitation    a.08/2016 Echo: Mod MR/TR.  [name] Orthostatic hypotension   . PAC (premature atrial contraction)   . Paroxysmal atrial flutter ([place])    a. dx 08/2016, started on amiodarone.  . Persistent atrial fibrillation followed by cardiologist-- dr Rockey Situ   a. dx AFib 08/2016 s/p DCCV 10/2016-->eliquis (CHA2DS2VASc = 5).  . Pulmonary hypertension ([place])    last echo in epic 09-16-2016  . Recurrent prostate cancer ([place]) UROLOGIST-- DR BELL   s/p   radioactive prostate seed implants in 1997;    due to PSA rising pt has been doing lupron injection's few past several yrs at dr bell office  . Renal calculi    bilateral per CT 06-18-2018  . S/P CABG x 5 04/04/2005   LIMA to LAD,  SVG to Ramus,  SVG to OM,  seqSVG to PLV and PDA  . Wears glasses     Past Surgical History:  Procedure Laterality Date  . CARDIAC CATHETERIZATION  04/02/2005   dr Doreatha Lew    Critical three-vessel coronary disease --  Essentially normal left ventricular function , ef 55%  . CARDIOVASCULAR STRESS TEST  05-01-2008  dr Doreatha Lew /  dr harding   Low risk nuclear study w/ no ischemia or scar but flattened septal motion/  ef 64%  . CARDIOVERSION N/A 11/19/2016   Procedure: CARDIOVERSION;  Surgeon: Jerline Pain, MD;  Location: [facility];  Service: Cardiovascular;  Laterality: N/A;  . COLONOSCOPY N/A 10/23/2016   Procedure: COLONOSCOPY;  Surgeon: Milus Banister, MD;  Location: [facility];  Service: Endoscopy;  Laterality: N/A;  . CORONARY ARTERY BYPASS GRAFT  04/04/2005   dr  Ricard Dillon   LIMA - LAD,  SVG - Ramus, SVG - OM,  seqSVG - PLV and PDA  . CYSTOSCOPY WITH LITHOLAPAXY N/A 05/07/2016   Procedure: CYSTOSCOPY WITH LITHOLAPAXY;  Surgeon: Rana Snare, MD;  Location: [facility];  Service: Urology;  Laterality: N/A;  . CYSTOSCOPY WITH RETROGRADE PYELOGRAM, URETEROSCOPY AND STENT PLACEMENT  09/03/2018   Procedure: CYSTOSCOPY WITH RETROGRADE PYELOGRAM, URETEROSCOPY LASER LITHOTRIPSY AND STENT PLACEMENT;  Surgeon: Lucas Mallow, MD;  Location: [facility];  Service: Urology;;  . HOLMIUM LASER APPLICATION N/A [id number]   Procedure: HOLMIUM LASER APPLICATION;  Surgeon: Rana Snare, MD;  Location[facility];  Service: Urology;  Laterality: N/A;  . INGUINAL HERNIA REPAIR Left 03-30-2005  dr Excell Seltzer   incarcerated   . RADIOACTIVE PROSTATE SEED IMPLANTS  1997  . TOTAL HIP ARTHROPLASTY Right 10/07/2018   Procedure: TOTAL HIP ARTHROPLASTY  ANTERIOR APPROACH;  Surgeon: Rod Can, MD;  Location: [facility];  Service: Orthopedics;  Laterality: Right;  . TRANSTHORACIC ECHOCARDIOGRAM  08/24/2014   mid anteroseptal and distal septak hypokinetic,  mild focal basal LVH, ef 60-65%,  grade 1 diastolic dysfunction/  mild MR/  mild LAE/  mild dilated RV with normal RVSP/  trivial TR    Family History  Problem Relation Age of Onset  . Heart disease Mother   . Cancer Mother   . Cancer Father   . Cancer Brother     SOCIAL HX: former smoker    Current Outpatient Medications:  .  acetaminophen (TYLENOL) 500 MG tablet, Take 1,000 mg by mouth every 6 (six) hours as needed for mild pain (for pain). , Disp: , Rfl:  .  amiodarone (PACERONE) 200 MG tablet, Take 1 tablet (200 mg total) by mouth daily., Disp: 90 tablet, Rfl: 3 .  apixaban (ELIQUIS) 5 MG  TABS tablet, Take 1 tablet (5 mg total) by mouth 2 (two) times daily., Disp: 60 tablet, Rfl: 6 .  omeprazole (PRILOSEC) 40 MG capsule, Take 1 capsule (40 mg total) by mouth daily., Disp: 90 capsule, Rfl: 1 .  rosuvastatin (CRESTOR) 40 MG tablet, Take 1 tablet (40 mg total) by mouth daily., Disp: 90 tablet, Rfl: 3 .  tamsulosin (FLOMAX) 0.4 MG CAPS capsule, Take 1 capsule (0.4 mg total) by mouth daily after supper., Disp: 30 capsule, Rfl: 0  EXAM: This was a telehealth telephone visit and thus no physical exam was completed.  ASSESSMENT AND PLAN:  Discussed the following assessment and plan:  History of prostate cancer He will continue to see urology.  We will check a PSA with his labs.  UTI (urinary tract infection) Patient has been doing well.  No recurrent symptoms.  We will check a urinalysis with his labs.  Persistent atrial fibrillation Patient is doing quite well.  He will continue his current regimen.  Dyslipidemia Continue Crestor.  He will be due for lipid panel on follow-up.  We will check a CK given his reported myalgias.  Consider discontinuation of Crestor for short  period of time once we receive this lab result.    I discussed the assessment and treatment plan with the patient. The patient was provided an opportunity to ask questions and all were answered. The patient agreed with the plan and demonstrated an understanding of the instructions.   The patient was advised to call back or seek an in-person evaluation if the symptoms worsen or if the condition fails to improve as anticipated.  I provided 11 minutes of non-face-to-face time during this encounter.   [name], MD

## 2019-04-25 NOTE — Patient Instructions (Signed)
Visit Information  Goals Addressed            This Visit's Progress     Patient Stated   . "We can't afford his medication" (pt-stated)       Current Barriers:  . Financial Barriers: patient has [employer] and reports copay for Eliquis is cost prohibitive at this time, now that patient is in the [employer].  [name] Applied for Medicare Extra Help on 03/31/2019 . Decided to pursue BMS Patient Assistance for Eliquis in the meantime; submitted 04/18/2019  Pharmacist Clinical Goal(s):  [name] Over the next 30 days, patient will work with PharmD and providers to relieve medication access concerns  Interventions: . [place]. After re-running application, patient has been APPROVED for Eliquis assistance through 07/28/2019. The pharmacy will be contacting the patient's daughter (preferred phone number) to schedule shipping for the first order. Patient will get a 90 day supply, and should be able to fill one more time before the end of the calendar year.   Patient Self Care Activities:  . Patient's daughter will collaborate with [facility] to set up shipping.   Please see past updates related to this goal by clicking on the "Past Updates" button in the selected goal         The patient verbalized understanding of instructions provided today and declined a print copy of patient instruction materials.   Plan:  - Will outreach patient/his daughter in the next 3-4 weeks for continued medication management support  [name], PharmD, [place] Pharmacist [facility] [phone]

## 2019-04-25 NOTE — Patient Instructions (Addendum)
  Mr. Curiale , Thank you for taking time to come for your Medicare Wellness Visit. I appreciate your ongoing commitment to your health goals. Please review the following plan we discussed and let me know if I can assist you in the future.   These are the goals we discussed: Goals      Patient Stated   . "We can't afford his medication" (pt-stated)     Current Barriers:  . Financial Barriers: patient has [employer] and reports copay for Eliquis is cost prohibitive at this time, now that patient is in the [employer].  [name] Applied for Medicare Extra Help on 03/31/2019 . Decided to pursue BMS Patient Assistance for Eliquis in the meantime; submitted 04/18/2019  Pharmacist Clinical Goal(s):  [name] Over the next 30 days, patient will work with PharmD and providers to relieve medication access concerns  Interventions: . [place]. They denied patient, noting that they calculated his out of pocket spend requirement to be ~$900, which was far more than what I anticipated based on his submitted income. I noted this, and they said that they would re-run the application off of the submitted social security income. They recommended I call back next week to follow up.   Patient Self Care Activities:  . Patient will provide necessary portions of application   Please see past updates related to this goal by clicking on the "Past Updates" button in the selected goal        Other   . Increase physical activity     Walk for exercise       This is a list of the screening recommended for you and due dates:  Health Maintenance  Topic Date Due  . Pneumonia vaccines (2 of 2 - PCV13) 01/07/2019  . Flu Shot  10/26/2019*  . Tetanus Vaccine  01/12/2028  *Topic was postponed. The date shown is not the original due date.

## 2019-04-25 NOTE — Assessment & Plan Note (Signed)
Patient is doing quite well.  He will continue his current regimen.

## 2019-04-25 NOTE — Progress Notes (Signed)
Subjective:   Carl [name] is a 83 y.o. male who presents for Medicare Annual/Subsequent preventive examination.  Review of Systems:  No ROS.  Medicare Wellness Virtual Visit.  Visual/audio telehealth visit, UTA vital signs.   See social history for additional risk factors.   Cardiac Risk Factors include: advanced age (>78mn, >>51women);male gender;hypertension     Objective:    Vitals: There were no vitals taken for this visit.  There is no height or weight on file to calculate BMI.  Advanced Directives 04/25/2019 03/08/2019 03/08/2019 03/03/2019 03/02/2019 02/25/2019 10/05/2018  Does Patient Have a Medical Advance Directive? Yes No Yes Yes No Yes Yes  Type of Advance Directive Living will - [place]  Does patient want to make changes to medical advance directive? No - Patient declined No - Patient declined - No - Patient declined Yes (ED - Information included in AVS) No - Patient declined No - Patient declined  Copy of HR[place]in Chart? - - No - copy requested - - No - copy requested No - copy requested  Would patient like information on creating a medical advance directive? - No - Patient declined - No - Patient declined Yes (ED - Information included in AVS) - -    Tobacco Social History   Tobacco Use  Smoking Status Former Smoker  . Years: 2.00  . Types: Cigarettes  . Quit date: 07/28/1960  . Years since quitting: 58.7  Smokeless Tobacco Never Used     Counseling given: Not Answered   Clinical Intake:  Pre-visit preparation completed: Yes        Diabetes: No  How often do you need to have someone help you when you read instructions, pamphlets, or other written materials from your doctor or pharmacy?: 4 - Often  Interpreter Needed?: No     Past Medical History:  Diagnosis Date  . (HFpEF) heart failure with preserved  ejection fraction ([place]    a. 08/2016 Echo: EF 50-55%.  .Marland KitchenAAA (abdominal aortic aneurysm) ([place]    CT abd. dated 06-18-2018 in epic,  3.1cm;  followed by pcp  . Arthritis    arms  . Bladder calculus   . Chronic anemia   . Chronic anxiety   . CKD (chronic kidney disease), stage III ([place]   . Coronary artery disease CARDIOLOGIST-  DR GRockey Situ  a. CABG 2006 with  LIMA-LAD, SVG-Ramus, SVG-OM, seq SVG-PLV-PDA.  . DOE (dyspnea on exertion)   . GERD (gastroesophageal reflux disease)   . Hematuria   . History of bladder stone   . History of non-ST elevation myocardial infarction (NSTEMI) 03/30/2005   post op recovery inguinal hernia repair  . History of recurrent UTIs    last admission 08-04-2018 pseudomonas UTI  . Hyperlipidemia   . Hyperplasia of prostate without lower urinary tract symptoms (LUTS)   . Hypertension   . Ischemic cardiomyopathy    a. echo (01/13) 45-50% septal akinesis, mild MR; b. Echo  (01/16) ef 60-65%, mid-apical anteroseptal hypokinesis; c. echo 09-16-2016 ef 50-55%  . Left ureteral stone   . Lung nodule    noted 08/ 2018;  followed by pcp  . Moderate mitral regurgitation    a.08/2016 Echo: Mod MR/TR.  .Marland KitchenOrthostatic hypotension   . PAC (premature atrial contraction)   . Paroxysmal atrial flutter ([place]    a. dx 08/2016, started on amiodarone.  .[name]  Persistent atrial fibrillation followed by cardiologist-- dr Rockey Situ   a. dx AFib 08/2016 s/p DCCV 10/2016-->eliquis (CHA2DS2VASc = 5).  . Pulmonary hypertension ([place])    last echo in epic 09-16-2016  . Recurrent prostate cancer ([place]) UROLOGIST-- DR BELL   s/p  radioactive prostate seed implants in 1997;    due to PSA rising pt has been doing lupron injection's few past several yrs at dr bell office  . Renal calculi    bilateral per CT 06-18-2018  . S/P CABG x 5 04/04/2005   LIMA to LAD,  SVG to Ramus,  SVG to OM,  seqSVG to PLV and PDA  . Wears glasses    Past Surgical History:  Procedure Laterality Date  . CARDIAC  CATHETERIZATION  04/02/2005   dr Doreatha Lew    Critical three-vessel coronary disease --  Essentially normal left ventricular function , ef 55%  . CARDIOVASCULAR STRESS TEST  05-01-2008  dr Doreatha Lew /  dr harding   Low risk nuclear study w/ no ischemia or scar but flattened septal motion/  ef 64%  . CARDIOVERSION N/A 11/19/2016   Procedure: CARDIOVERSION;  Surgeon: Jerline Pain, MD;  Location: [facility];  Service: Cardiovascular;  Laterality: N/A;  . COLONOSCOPY N/A 10/23/2016   Procedure: COLONOSCOPY;  Surgeon: Milus Banister, MD;  Location: [facility];  Service: Endoscopy;  Laterality: N/A;  . CORONARY ARTERY BYPASS GRAFT  04/04/2005   dr  Ricard Dillon   LIMA - LAD,  SVG - Ramus, SVG - OM,  seqSVG - PLV and PDA  . CYSTOSCOPY WITH LITHOLAPAXY N/A 05/07/2016   Procedure: CYSTOSCOPY WITH LITHOLAPAXY;  Surgeon: Rana Snare, MD;  Location[facility];  Service: Urology;  Laterality: N/A;  . CYSTOSCOPY WITH RETROGRADE PYELOGRAM, URETEROSCOPY AND STENT PLACEMENT  09/03/2018   Procedure: CYSTOSCOPY WITH RETROGRADE PYELOGRAM, URETEROSCOPY LASER LITHOTRIPSY AND STENT PLACEMENT;  Surgeon: Lucas Mallow, MD;  Location: [facility];  Service: Urology;;  . HOLMIUM LASER APPLICATION N/A [id number]/4431   Procedure: HOLMIUM LASER APPLICATION;  Surgeon: Rana Snare, MD;  Location: [facility];  Service: Urology;  Laterality: N/A;  . INGUINAL HERNIA REPAIR Left 03-30-2005  dr Excell Seltzer   incarcerated   . RADIOACTIVE PROSTATE SEED IMPLANTS  1997  . TOTAL HIP ARTHROPLASTY Right 10/07/2018   Procedure: TOTAL HIP ARTHROPLASTY ANTERIOR APPROACH;  Surgeon: Rod Can, MD;  Location: [facility];  Service: Orthopedics;  Laterality: Right;  . TRANSTHORACIC ECHOCARDIOGRAM  08/24/2014   mid anteroseptal and distal septak hypokinetic,  mild focal basal LVH, ef 60-65%,  grade 1 diastolic dysfunction/  mild MR/  mild LAE/  mild dilated RV with normal RVSP/  trivial TR   Family History  Problem Relation  Age of Onset  . Heart disease Mother   . Cancer Mother   . Cancer Father   . Cancer Brother    Social History   Socioeconomic History  . Marital status: Widowed    Spouse name: Not on file  . Number of children: 3  . Years of education: Not on file  . Highest education level: Not on file  Occupational History  . Occupation: retired  Scientific laboratory technician  . Financial resource strain: Not on file  . Food insecurity    Worry: Never true    Inability: Never true  . Transportation needs    Medical: No    Non-medical: No  Tobacco Use  . Smoking status: Former Smoker    Years: 2.00    Types: Cigarettes  Quit date: 07/28/1960    Years since quitting: 58.7  . Smokeless tobacco: Never Used  Substance and Sexual Activity  . Alcohol use: No  . Drug use: No  . Sexual activity: Never  Lifestyle  . Physical activity    Days per week: 0 days    Minutes per session: Not on file  . Stress: Only a little  Relationships  . Social Herbalist on phone: Not on file    Gets together: Not on file    Attends religious service: Not on file    Active member of club or organization: Not on file    Attends meetings of clubs or organizations: Not on file    Relationship status: Not on file  Other Topics Concern  . Not on file  Social History Narrative   Widowed lives with daughter [name]    Outpatient Encounter Medications as of 04/25/2019  Medication Sig  . acetaminophen (TYLENOL) 500 MG tablet Take 1,000 mg by mouth every 6 (six) hours as needed for mild pain (for pain).   [name] amiodarone (PACERONE) 200 MG tablet Take 1 tablet (200 mg total) by mouth daily.  [name] apixaban (ELIQUIS) 5 MG TABS tablet Take 1 tablet (5 mg total) by mouth 2 (two) times daily.  [name] omeprazole (PRILOSEC) 40 MG capsule Take 1 capsule (40 mg total) by mouth daily.  . rosuvastatin (CRESTOR) 40 MG tablet Take 1 tablet (40 mg total) by mouth daily.  . tamsulosin (FLOMAX) 0.4 MG CAPS capsule Take 1 capsule (0.4 mg  total) by mouth daily after supper.   No facility-administered encounter medications on file as of 04/25/2019.     Activities of Daily Living In your present state of health, do you have any difficulty performing the following activities: 04/25/2019 03/08/2019  Hearing? N N  Vision? N N  Difficulty concentrating or making decisions? Y N  Comment - -  Walking or climbing stairs? Y Y  Dressing or bathing? Y Y  Doing errands, shopping? Tempie Donning  Preparing Food and eating ? Y -  Using the Toilet? N -  In the past six months, have you accidently leaked urine? N -  Do you have problems with loss of bowel control? N -  Managing your Medications? Y -  Managing your Finances? Y -  Housekeeping or managing your Housekeeping? Y -  Some recent data might be hidden    Patient Care Team: [name], MD as PCP - General (Family Medicine) Minna Merritts, MD as PCP - Cardiology (Cardiology) De Hollingshead, [facility] as Pharmacist (Pharmacist)   Assessment:   This is a routine wellness examination for [place].  I connected with patient 04/25/19 at  8:30 AM EDT by an audio enabled telemedicine application and verified that I am speaking with the correct person using two identifiers. Patient stated full name and DOB. Patient gave permission to continue with virtual visit. Patient's daughter [name] is present and information is received from both, HIPAA compliant. Patient's location was at home and Nurse's location was at [place] office.   Health Maintenance Due: -Influenza vaccine 2020- discussed; to be completed in season with doctor or local pharmacy.   -PNA - discussed; to be completed with doctor in visit or local pharmacy.  Update all pending maintenance due as appropriate.   See completed HM at the end of note.   Eye: Visual acuity not assessed. Virtual visit.   Dental: Dentures- yes  Hearing: Demonstrates normal hearing during  visit.  Safety:  Patient feels safe at home- yes  Patient does have smoke detectors at home- yes Patient does wear sunscreen or protective clothing when in direct sunlight - yes Patient does wear seat belt when in a moving vehicle - yes Patient drives- no Adequate lighting in walkways free from debris- yes Grab bars and handrails used as appropriate- yes Ambulates with a walker as an assistive device-yes Cell phone or lifeline/life alert/medic alert on person when ambulating outside of the home- no. Daughter is in the process of receiving a life alert home kit system.   Social: Alcohol intake - no       Smoking history-former Smokers in home? none Illicit drug use? none  Depression: PHQ 2 &9 complete. See screening below. Denies irritability, anhedonia, sadness/tearfullness.  Stable.   Falls: See screening below.  No falls since the last in March.   Medication: Taking as directed and without issues.   Covid-19: Precautions and sickness symptoms discussed. Wears mask, social distancing, hand hygiene as appropriate.   Activities of Daily Living Patient denies needing assistance with: feeding themselves, getting from bed to chair, getting to the toilet. Assisted by daughter [name] with home management, bathing/dressing, financial management.    Memory: Patient is alert and oriented x3.   BMI- discussed the importance of a healthy diet, water intake and the benefits of aerobic exercise.  Educational material provided.  Physical activity- no routine. Encouraged to stay active with walking.   Diet: Regular Water: good intake Caffeine: 1 cup of coffee  Other Providers Patient Care Team: [name], MD as PCP - General (Family Medicine) Rockey Situ Kathlene November, MD as PCP - Cardiology (Cardiology) De Hollingshead, [facility] as Pharmacist (Pharmacist)  Exercise Activities and Dietary recommendations Current Exercise Habits: The patient does not participate in regular exercise at present  Goals      Patient Stated   . "We  can't afford his medication" (pt-stated)     Current Barriers:  . Financial Barriers: patient has [employer] and reports copay for Eliquis is cost prohibitive at this time, now that patient is in the [employer].  [name] Applied for Medicare Extra Help on 03/31/2019 . Decided to pursue BMS Patient Assistance for Eliquis in the meantime; submitted 04/18/2019  Pharmacist Clinical Goal(s):  [name] Over the next 30 days, patient will work with PharmD and providers to relieve medication access concerns  Interventions: . [place]. They denied patient, noting that they calculated his out of pocket spend requirement to be ~$900, which was far more than what I anticipated based on his submitted income. I noted this, and they said that they would re-run the application off of the submitted social security income. They recommended I call back next week to follow up.   Patient Self Care Activities:  . Patient will provide necessary portions of application   Please see past updates related to this goal by clicking on the "Past Updates" button in the selected goal        Other   . Increase physical activity     Walk for exercise       Fall Risk Fall Risk  04/25/2019 06/18/2018 03/04/2018 03/24/2017  Falls in the past year? 1 0 No No  Number falls in past yr: 1 - - -  Injury with Fall? 1 - - -   Timed Get Up and Go Performed: no, virtual visit  Depression Screen [facility] 2/9 Scores 04/25/2019 03/04/2018 03/24/2017  PHQ - 2 Score 0 0 0    Cognitive Function MMSE - Mini Mental State Exam 04/25/2019  Not completed: Unable to complete     6CIT Screen 03/04/2018  What Year? 0 points  What month? 0 points  What time? 0 points  Count back from 20 0 points  Months in reverse 0 points  Repeat phrase 0 points  Total Score 0    Immunization History  Administered Date(s) Administered  . Influenza, High Dose Seasonal PF 05/25/2017, 04/23/2018  . Influenza-Unspecified  06/01/2014  . Pneumococcal Polysaccharide-23 01/06/2018  . Tdap 01/11/2018   Screening Tests Health Maintenance  Topic Date Due  . PNA vac Low Risk Adult (2 of 2 - PCV13) 01/07/2019  . INFLUENZA VACCINE  10/26/2019 (Originally 02/26/2019)  . TETANUS/TDAP  01/12/2028       Plan:   Keep all routine maintenance appointments.   Follow up today with your doctor @ 9:00  Flu shot at your doctors office or local pharmacy  Medicare Attestation I have personally reviewed: The patient's medical and social history Their use of alcohol, tobacco or illicit drugs Their current medications and supplements The patient's functional ability including ADLs,fall risks, home safety risks, cognitive, and hearing and visual impairment Diet and physical activities Evidence for depression   In addition, I have reviewed and discussed with patient certain preventive protocols, quality metrics, and best practice recommendations. A written personalized care plan for preventive services as well as general preventive health recommendations were provided to patient via mail.     Varney Biles, LPN  02/06/4579

## 2019-04-25 NOTE — Assessment & Plan Note (Signed)
He will continue to see urology.  We will check a PSA with his labs.

## 2019-04-25 NOTE — Chronic Care Management (AMB) (Signed)
  Chronic Care Management   Follow Up Note   04/25/2019 Name: Carl [name] MRN: [id number] DOB: 03-Mar-1936  Referred by: Carl Haven, MD Reason for referral : Chronic Care Management (Medication Management)   Carl [name] is a 83 y.o. year old male who is a primary care patient of Carl [name], Carl Adam, MD. The CCM team was consulted for assistance with chronic disease management and care coordination needs.    Care coordination completed today.   Review of patient status, including review of consultants reports, relevant laboratory and other test results, and collaboration with appropriate care team members and the patient's provider was performed as part of comprehensive patient evaluation and provision of chronic care management services.    SDOH (Social Determinants of Health) screening performed today: Financial Strain . See Care Plan for related entries.   Advanced Directives Status: N See Care Plan and Vynca application for related entries.  Outpatient Encounter Medications as of 04/25/2019  Medication Sig Note  . acetaminophen (TYLENOL) 500 MG tablet Take 1,000 mg by mouth every 6 (six) hours as needed for mild pain (for pain).  03/31/2019: Taking 2 Q4-6 H  . amiodarone (PACERONE) 200 MG tablet Take 1 tablet (200 mg total) by mouth daily.   [name] apixaban (ELIQUIS) 5 MG TABS tablet Take 1 tablet (5 mg total) by mouth 2 (two) times daily.   [name] omeprazole (PRILOSEC) 40 MG capsule Take 1 capsule (40 mg total) by mouth daily.   . rosuvastatin (CRESTOR) 40 MG tablet Take 1 tablet (40 mg total) by mouth daily.   . tamsulosin (FLOMAX) 0.4 MG CAPS capsule Take 1 capsule (0.4 mg total) by mouth daily after supper.    No facility-administered encounter medications on file as of 04/25/2019.      Goals Addressed            This Visit's Progress     Patient Stated   . "We can't afford his medication" (pt-stated)       Current Barriers:  . Financial Barriers: patient has  [employer] and reports copay for Eliquis is cost prohibitive at this time, now that patient is in the [employer].  [name] Applied for Medicare Extra Help on 03/31/2019 . Decided to pursue BMS Patient Assistance for Eliquis in the meantime; submitted 04/18/2019  Pharmacist Clinical Goal(s):  [name] Over the next 30 days, patient will work with PharmD and providers to relieve medication access concerns  Interventions: . [place]. After re-running application, patient has been APPROVED for Eliquis assistance through 07/28/2019. The pharmacy will be contacting the patient's daughter (preferred phone number) to schedule shipping for the first order. Patient will get a 90 day supply, and should be able to fill one more time before the end of the calendar year.   Patient Self Care Activities:  . Patient's daughter will collaborate with [place] to set up shipping.   Please see past updates related to this goal by clicking on the "Past Updates" button in the selected goal          Plan:  - Will outreach patient/his daughter in the next 3-4 weeks for continued medication management support  Carl [name], PharmD, [place] Pharmacist [employer] [phone]

## 2019-04-25 NOTE — Assessment & Plan Note (Addendum)
Continue Crestor.  He will be due for lipid panel on follow-up.  We will check a CK given his reported myalgias.  Consider discontinuation of Crestor for short period of time once we receive this lab result.

## 2019-05-11 ENCOUNTER — Ambulatory Visit: Payer: Medicare Other

## 2019-05-11 ENCOUNTER — Other Ambulatory Visit: Payer: Self-pay | Admitting: Family Medicine

## 2019-05-11 ENCOUNTER — Other Ambulatory Visit: Payer: Self-pay

## 2019-05-11 ENCOUNTER — Other Ambulatory Visit (INDEPENDENT_AMBULATORY_CARE_PROVIDER_SITE_OTHER): Payer: Medicare Other

## 2019-05-11 DIAGNOSIS — Z8546 Personal history of malignant neoplasm of prostate: Secondary | ICD-10-CM | POA: Diagnosis not present

## 2019-05-11 DIAGNOSIS — E785 Hyperlipidemia, unspecified: Secondary | ICD-10-CM

## 2019-05-11 DIAGNOSIS — Z23 Encounter for immunization: Secondary | ICD-10-CM | POA: Diagnosis not present

## 2019-05-11 DIAGNOSIS — M791 Myalgia, unspecified site: Secondary | ICD-10-CM | POA: Diagnosis not present

## 2019-05-11 DIAGNOSIS — N3001 Acute cystitis with hematuria: Secondary | ICD-10-CM | POA: Diagnosis not present

## 2019-05-11 DIAGNOSIS — R8290 Unspecified abnormal findings in urine: Secondary | ICD-10-CM

## 2019-05-11 LAB — COMPREHENSIVE METABOLIC PANEL
ALT: 16 U/L (ref 0–53)
AST: 15 U/L (ref 0–37)
Albumin: 3.8 g/dL (ref 3.5–5.2)
Alkaline Phosphatase: 78 U/L (ref 39–117)
BUN: 18 mg/dL (ref 6–23)
CO2: 22 mEq/L (ref 19–32)
Calcium: 8.9 mg/dL (ref 8.4–10.5)
Chloride: 108 mEq/L (ref 96–112)
Creatinine, Ser: 1.12 mg/dL (ref 0.40–1.50)
GFR: 62.84 mL/min (ref >60.00)
Glucose, Bld: 119 mg/dL — ABNORMAL HIGH (ref 70–99)
Potassium: 3.6 mEq/L (ref 3.5–5.1)
Sodium: 141 mEq/L (ref 135–145)
Total Bilirubin: 0.4 mg/dL (ref 0.2–1.2)
Total Protein: 6.1 g/dL (ref 6.0–8.3)

## 2019-05-11 LAB — POCT URINALYSIS DIPSTICK
Glucose, UA: NEGATIVE
Ketones, UA: NEGATIVE
Nitrite, UA: POSITIVE
Protein, UA: POSITIVE — AB
Spec Grav, UA: 1.02 (ref 1.010–1.025)
Urobilinogen, UA: 0.2 E.U./dL
pH, UA: 6 (ref 5.0–8.0)

## 2019-05-11 LAB — PSA: PSA: 5.95 ng/mL — ABNORMAL HIGH (ref 0.10–4.00)

## 2019-05-11 LAB — CK: Total CK: 27 U/L (ref 7–232)

## 2019-05-11 NOTE — Addendum Note (Signed)
Addended by: Leeanne Rio on: 05/11/2019 03:07 PM   Modules accepted: Orders

## 2019-05-11 NOTE — Addendum Note (Signed)
Addended by: Leeanne Rio on: 05/11/2019 03:19 PM   Modules accepted: Orders

## 2019-05-13 LAB — URINALYSIS, MICROSCOPIC ONLY
Casts: NONE SEEN /lpf
Epithelial Cells (non renal): NONE SEEN /hpf (ref 0–10)
RBC, Urine: >30 /hpf — AB (ref 0–2)
WBC, UA: >30 /hpf — AB (ref 0–5)

## 2019-05-15 LAB — URINE CULTURE

## 2019-05-17 ENCOUNTER — Ambulatory Visit: Payer: Self-pay | Admitting: Pharmacist

## 2019-05-17 DIAGNOSIS — I4819 Other persistent atrial fibrillation: Secondary | ICD-10-CM

## 2019-05-17 NOTE — Patient Instructions (Signed)
Visit Information  Goals Addressed            This Visit's Progress     Patient Stated   . "We can't afford his medication" (pt-stated)       Current Barriers:  . Financial Barriers: patient has [employer] and reports copay for Eliquis is cost prohibitive at this time, now that patient is in the [employer].  [name] Applied for Medicare Extra Help on 03/31/2019 . Patient APPROVED for Eliquis through 07/28/2019. Patient's daughter contacted me today, noted that they had not received the medication yet.  Pharmacist Clinical Goal(s):  [name] Over the next 30 days, patient will work with PharmD and providers to relieve medication access concerns  Interventions: . [place]. They appear to have attempted to contact patient's daughter 2-3 times to set up shipping. Provided their phone number to daughter to call and set up shipping, and request an expedited shipment. Requested that patient's daughter contact me if they are unable to expedite so that we can evaluate if patient needs samples.   Patient Self Care Activities:  . Patient's daughter will collaborate with [facility] to set up shipping.   Please see past updates related to this goal by clicking on the "Past Updates" button in the selected goal         The patient verbalized understanding of instructions provided today and declined a print copy of patient instruction materials.    Plan:  - Will follow up with patient's daughter in the next 1-2 weeks to ensure they received medication  [name], PharmD, [place] Pharmacist [facility] [phone]

## 2019-05-17 NOTE — Chronic Care Management (AMB) (Signed)
  Chronic Care Management   Follow Up Note   05/17/2019 Name: KEYNON THUL MRN: [id number] DOB: 06/18/1936  Referred by: [name], MD Reason for referral : Chronic Care Management (Medication Management)   PAO GHAN is a 83 y.o. year old male who is a primary care patient of [name], [name], MD. The CCM team was consulted for assistance with chronic disease management and care coordination needs.    Received call from patient's wife regarding medication access needs.   Review of patient status, including review of consultants reports, relevant laboratory and other test results, and collaboration with appropriate care team members and the patient's provider was performed as part of comprehensive patient evaluation and provision of chronic care management services.    SDOH (Social Determinants of Health) screening performed today: Financial Strain . See Care Plan for related entries.   Advanced Directives Status: N See Care Plan and Vynca application for related entries.  Outpatient Encounter Medications as of 05/17/2019  Medication Sig Note  . acetaminophen (TYLENOL) 500 MG tablet Take 1,000 mg by mouth every 6 (six) hours as needed for mild pain (for pain).  03/31/2019: Taking 2 Q4-6 H  . amiodarone (PACERONE) 200 MG tablet Take 1 tablet (200 mg total) by mouth daily.   [name] apixaban (ELIQUIS) 5 MG TABS tablet Take 1 tablet (5 mg total) by mouth 2 (two) times daily.   [name] omeprazole (PRILOSEC) 40 MG capsule Take 1 capsule (40 mg total) by mouth daily.   . rosuvastatin (CRESTOR) 40 MG tablet Take 1 tablet (40 mg total) by mouth daily.   . tamsulosin (FLOMAX) 0.4 MG CAPS capsule Take 1 capsule (0.4 mg total) by mouth daily after supper.    No facility-administered encounter medications on file as of 05/17/2019.      Goals Addressed            This Visit's Progress     Patient Stated   . "We can't afford his medication" (pt-stated)       Current Barriers:  .  Financial Barriers: patient has [employer] and reports copay for Eliquis is cost prohibitive at this time, now that patient is in the [employer].  [name] Applied for Medicare Extra Help on 03/31/2019 . Patient APPROVED for Eliquis through 07/28/2019. Patient's daughter contacted me today, noted that they had not received the medication yet.  Pharmacist Clinical Goal(s):  [name] Over the next 30 days, patient will work with PharmD and providers to relieve medication access concerns  Interventions: . [place]. They appear to have attempted to contact patient's daughter 2-3 times to set up shipping. Provided their phone number to daughter to call and set up shipping, and request an expedited shipment. Requested that patient's daughter contact me if they are unable to expedite so that we can evaluate if patient needs samples.   Patient Self Care Activities:  . Patient's daughter will collaborate with [place] to set up shipping.   Please see past updates related to this goal by clicking on the "Past Updates" button in the selected goal          Plan:  - Will follow up with patient's daughter in the next 1-2 weeks to ensure they received medication  [name], PharmD, [place] Pharmacist [place] [phone]

## 2019-05-18 ENCOUNTER — Telehealth: Payer: Self-pay

## 2019-05-18 NOTE — Telephone Encounter (Signed)
Copied from [place] [id number]. Topic: General - Inquiry >> May 18, 2019  9:51 AM Virl Axe D wrote: Reason for CRM: Pt's daughter [name] would like to request a call from Gae Bon with pt's results from urinalysis. She would like to make sure she understands what the results were and next steps. Pt was unable to give clear information.

## 2019-05-20 ENCOUNTER — Other Ambulatory Visit: Payer: Medicare Other

## 2019-05-21 NOTE — Telephone Encounter (Signed)
This certainly could be related to a kidney stone. I would like for him to see urology for this. Please find out from his daughter who he has seen previously and we can get him an appointment. If he develops and UTI symptoms they need to let us know.  Thanks.

## 2019-05-23 ENCOUNTER — Ambulatory Visit (INDEPENDENT_AMBULATORY_CARE_PROVIDER_SITE_OTHER): Payer: Medicare Other | Admitting: Pharmacist

## 2019-05-23 DIAGNOSIS — I4819 Other persistent atrial fibrillation: Secondary | ICD-10-CM

## 2019-05-23 NOTE — Patient Instructions (Signed)
Visit Information  Goals Addressed            This Visit's Progress     Patient Stated   . "We can't afford his medication" (pt-stated)       Current Barriers:  . Financial Barriers: resolved for 2020 = Patient APPROVED for Eliquis through 07/28/2019. [name] Applied for Medicare Extra Help on 03/31/2019.   Pharmacist Clinical Goal(s):  [name] Over the next 30 days, patient will work with PharmD and providers to relieve medication access concerns  Interventions: . Contacted patient's daughter; she confirms that patient received Eliquis and is taking BID as prescribed. She could not talk for long today, she was in an accident yesterday and was going to an appointment today.  . She noted that she has not had a chance to schedule follow up with urology yet, but plans to do so today after her own appointment . [place] to try and see if patient was approved for LIS. Was told that he does have LIS noted on his account, but did not note what level. Contacted [facility] for a test claim for Eliquis to be run - they noted it was $75/30 day supply. Sounds as if patient may have been approved for partial LIS; however, will follow up with [name] moving forward if they received any information from [facility] in the mail . If patient denied for full extra help, will plan to investigate the feasibility of a Tier Exception request to [facility] for Eliquis for 2021.   Patient Self Care Activities:  . Patient will take medications as prescribed  Please see past updates related to this goal by clicking on the "Past Updates" button in the selected goal         The patient verbalized understanding of instructions provided today and declined a print copy of patient instruction materials.    Plan:  - Will follow up with patient in 4-5 weeks for continued medication management support  [name], PharmD, [place] Pharmacist [place] [phone]

## 2019-05-23 NOTE — Telephone Encounter (Signed)
Noted  

## 2019-05-23 NOTE — Chronic Care Management (AMB) (Signed)
Chronic Care Management   Follow Up Note   05/23/2019 Name: Carl [name] MRN: [id number] DOB: 03-Nov-1935  Referred by: [name], MD Reason for referral : Chronic Care Management (Medication Management)   Carl [name] is a 83 y.o. year old male who is a primary care patient of [name], [name], MD. The CCM team was consulted for assistance with chronic disease management and care coordination needs.    Care coordination completed today.   Review of patient status, including review of consultants reports, relevant laboratory and other test results, and collaboration with appropriate care team members and the patient's provider was performed as part of comprehensive patient evaluation and provision of chronic care management services.    SDOH (Social Determinants of Health) screening performed today: Financial Strain . See Care Plan for related entries.   Advanced Directives Status: N See Care Plan and Vynca application for related entries.  Outpatient Encounter Medications as of 05/23/2019  Medication Sig Note  . acetaminophen (TYLENOL) 500 MG tablet Take 1,000 mg by mouth every 6 (six) hours as needed for mild pain (for pain).  03/31/2019: Taking 2 Q4-6 H  . amiodarone (PACERONE) 200 MG tablet Take 1 tablet (200 mg total) by mouth daily.   [name] apixaban (ELIQUIS) 5 MG TABS tablet Take 1 tablet (5 mg total) by mouth 2 (two) times daily.   [name] omeprazole (PRILOSEC) 40 MG capsule Take 1 capsule (40 mg total) by mouth daily.   . rosuvastatin (CRESTOR) 40 MG tablet Take 1 tablet (40 mg total) by mouth daily.   . tamsulosin (FLOMAX) 0.4 MG CAPS capsule Take 1 capsule (0.4 mg total) by mouth daily after supper.    No facility-administered encounter medications on file as of 05/23/2019.      Goals Addressed            This Visit's Progress     Patient Stated   . "We can't afford his medication" (pt-stated)       Current Barriers:  . Financial Barriers: resolved for  2020 = Patient APPROVED for Eliquis through 07/28/2019. [name] Applied for Medicare Extra Help on 03/31/2019.   Pharmacist Clinical Goal(s):  [name] Over the next 30 days, patient will work with PharmD and providers to relieve medication access concerns  Interventions: . Contacted patient's daughter; she confirms that patient received Eliquis and is taking BID as prescribed. She could not talk for long today, she was in an accident yesterday and was going to an appointment today.  . She noted that she has not had a chance to schedule follow up with urology yet, but plans to do so today after her own appointment . [place] to try and see if patient was approved for LIS. Was told that he does have LIS noted on his account, but did not note what level. [facility]ed [facility] for a test claim for Eliquis to be run - they noted it was $75/30 day supply. Sounds as if patient may have been approved for partial LIS; however, will follow up with [name] moving forward if they received any information from [facility] in the mail . If patient denied for full extra help, will plan to investigate the feasibility of a Tier Exception request to [facility] for Eliquis for 2021.   Patient Self Care Activities:  . Patient will take medications as prescribed  Please see past updates related to this goal by clicking on the "Past Updates" button in the selected goal  Plan:  - Will follow up with patient in 4-5 weeks for continued medication management support  [name], PharmD, [place] Pharmacist [place] [phone]

## 2019-06-01 ENCOUNTER — Other Ambulatory Visit (HOSPITAL_COMMUNITY): Payer: Self-pay | Admitting: Urology

## 2019-06-01 DIAGNOSIS — C61 Malignant neoplasm of prostate: Secondary | ICD-10-CM

## 2019-06-04 DIAGNOSIS — N200 Calculus of kidney: Secondary | ICD-10-CM | POA: Diagnosis not present

## 2019-06-04 DIAGNOSIS — Z87891 Personal history of nicotine dependence: Secondary | ICD-10-CM | POA: Insufficient documentation

## 2019-06-04 DIAGNOSIS — R339 Retention of urine, unspecified: Secondary | ICD-10-CM | POA: Diagnosis present

## 2019-06-04 DIAGNOSIS — Z951 Presence of aortocoronary bypass graft: Secondary | ICD-10-CM | POA: Diagnosis not present

## 2019-06-04 DIAGNOSIS — Z7901 Long term (current) use of anticoagulants: Secondary | ICD-10-CM | POA: Insufficient documentation

## 2019-06-04 DIAGNOSIS — N183 Chronic kidney disease, stage 3 unspecified: Secondary | ICD-10-CM | POA: Diagnosis not present

## 2019-06-04 DIAGNOSIS — I2510 Atherosclerotic heart disease of native coronary artery without angina pectoris: Secondary | ICD-10-CM | POA: Diagnosis not present

## 2019-06-04 DIAGNOSIS — Z79899 Other long term (current) drug therapy: Secondary | ICD-10-CM | POA: Insufficient documentation

## 2019-06-04 DIAGNOSIS — I129 Hypertensive chronic kidney disease with stage 1 through stage 4 chronic kidney disease, or unspecified chronic kidney disease: Secondary | ICD-10-CM | POA: Insufficient documentation

## 2019-06-05 ENCOUNTER — Emergency Department
Admission: EM | Admit: 2019-06-05 | Discharge: 2019-06-05 | Disposition: A | Discharge status: Home / Self Care | Payer: Medicare Other | Attending: Emergency Medicine | Admitting: Emergency Medicine

## 2019-06-05 ENCOUNTER — Emergency Department: Payer: Medicare Other

## 2019-06-05 ENCOUNTER — Other Ambulatory Visit: Payer: Self-pay

## 2019-06-05 DIAGNOSIS — R339 Retention of urine, unspecified: Secondary | ICD-10-CM

## 2019-06-05 DIAGNOSIS — N200 Calculus of kidney: Secondary | ICD-10-CM

## 2019-06-05 DIAGNOSIS — I714 Abdominal aortic aneurysm, without rupture, unspecified: Secondary | ICD-10-CM

## 2019-06-05 HISTORY — DX: Abdominal aortic aneurysm, without rupture: I71.4

## 2019-06-05 HISTORY — DX: Abdominal aortic aneurysm, without rupture, unspecified: I71.40

## 2019-06-05 LAB — URINALYSIS, COMPLETE (UACMP) WITH MICROSCOPIC
Bacteria, UA: NONE SEEN
RBC / HPF: >50 RBC/hpf — ABNORMAL HIGH (ref 0–5)
Specific Gravity, Urine: 1.02 (ref 1.005–1.030)
Squamous Epithelial / HPF: NONE SEEN (ref 0–5)
WBC, UA: >50 WBC/hpf — ABNORMAL HIGH (ref 0–5)

## 2019-06-05 LAB — BASIC METABOLIC PANEL
Anion gap: 7 (ref 5–15)
BUN: 23 mg/dL (ref 8–23)
CO2: 25 mmol/L (ref 22–32)
Calcium: 8.7 mg/dL — ABNORMAL LOW (ref 8.9–10.3)
Chloride: 109 mmol/L (ref 98–111)
Creatinine, Ser: 1.47 mg/dL — ABNORMAL HIGH (ref 0.61–1.24)
GFR calc Af Amer: 51 mL/min — ABNORMAL LOW (ref >60)
GFR calc non Af Amer: 44 mL/min — ABNORMAL LOW (ref >60)
Glucose, Bld: 103 mg/dL — ABNORMAL HIGH (ref 70–99)
Potassium: 3.5 mmol/L (ref 3.5–5.1)
Sodium: 141 mmol/L (ref 135–145)

## 2019-06-05 LAB — CBC WITH DIFFERENTIAL/PLATELET
Abs Immature Granulocytes: 0.01 10*3/uL (ref 0.00–0.07)
Basophils Absolute: 0 10*3/uL (ref 0.0–0.1)
Basophils Relative: 0 %
Eosinophils Absolute: 0 10*3/uL (ref 0.0–0.5)
Eosinophils Relative: 0 %
HCT: 31.3 % — ABNORMAL LOW (ref 39.0–52.0)
Hemoglobin: 9.9 g/dL — ABNORMAL LOW (ref 13.0–17.0)
Immature Granulocytes: 0 %
Lymphocytes Relative: 12 %
Lymphs Abs: 0.6 10*3/uL — ABNORMAL LOW (ref 0.7–4.0)
MCH: 29.8 pg (ref 26.0–34.0)
MCHC: 31.6 g/dL (ref 30.0–36.0)
MCV: 94.3 fL (ref 80.0–100.0)
Monocytes Absolute: 0.4 10*3/uL (ref 0.1–1.0)
Monocytes Relative: 8 %
Neutro Abs: 4 10*3/uL (ref 1.7–7.7)
Neutrophils Relative %: 80 %
Platelets: 136 10*3/uL — ABNORMAL LOW (ref 150–400)
RBC: 3.32 MIL/uL — ABNORMAL LOW (ref 4.22–5.81)
RDW: 14.9 % (ref 11.5–15.5)
WBC: 5 10*3/uL (ref 4.0–10.5)
nRBC: 0 % (ref 0.0–0.2)

## 2019-06-05 LAB — HEMOGLOBIN AND HEMATOCRIT, BLOOD
HCT: 29.6 % — ABNORMAL LOW (ref 39.0–52.0)
Hemoglobin: 9.3 g/dL — ABNORMAL LOW (ref 13.0–17.0)

## 2019-06-05 MED ORDER — TAMSULOSIN HCL 0.4 MG PO CAPS: 0.4000 mg | ORAL_CAPSULE | Freq: Every day | ORAL | 0 refills | Status: AC

## 2019-06-05 MED ORDER — CEFDINIR 300 MG PO CAPS: 300.0000 mg | ORAL_CAPSULE | Freq: Two times a day (BID) | ORAL | 0 refills | Status: AC

## 2019-06-05 MED ORDER — IOHEXOL 350 MG/ML SOLN
75.0000 mL | Freq: Once | INTRAVENOUS | Status: AC | PRN
  Administered 2019-06-05: 07:00:00 75 mL via INTRAVENOUS

## 2019-06-05 NOTE — ED Provider Notes (Signed)
[facility] Emergency Department Provider Note  ____________________________________________  Time seen: Approximately 4:38 AM  I have reviewed the triage vital signs and the nursing notes.   HISTORY  Chief Complaint Urinary Retention   HPI Carl [name] is a 82 y.o. male with a history of chronic anemia, chronic kidney disease, AAA, kidney stones, remote prostate cancer CHF, a flutter/A. fib on Eliquis who presents for evaluation of urinary retention.  Patient reports intermittent hematuria for the last 2 weeks.  He reports blood tinged urine.  When he presented to the hospital he felt like he had to urinate but was unable for 4 hours.  Bladder scan read 200 cc and a Foley catheter was placed in the waiting room with 200 cc of gross hematuria.  Patient is complaining of mild suprapubic pain and left flank pain which have been ongoing for 2 weeks as well.  No fever or chills, no dysuria, no nausea or vomiting.  Past Medical History:  Diagnosis Date  . (HFpEF) heart failure with preserved ejection fraction ([place])    a. 08/2016 Echo: EF 50-55%.  [name] AAA (abdominal aortic aneurysm) ([place])    CT abd. dated 06-18-2018 in epic,  3.1cm;  followed by pcp  . Arthritis    arms  . Bladder calculus   . Chronic anemia   . Chronic anxiety   . CKD (chronic kidney disease), stage III   . Coronary artery disease CARDIOLOGIST-  DR Rockey Situ   a. CABG 2006 with  LIMA-LAD, SVG-Ramus, SVG-OM, seq SVG-PLV-PDA.  . DOE (dyspnea on exertion)   . GERD (gastroesophageal reflux disease)   . Hematuria   . History of bladder stone   . History of non-ST elevation myocardial infarction (NSTEMI) 03/30/2005   post op recovery inguinal hernia repair  . History of recurrent UTIs    last admission 08-04-2018 pseudomonas UTI  . Hyperlipidemia   . Hyperplasia of prostate without lower urinary tract symptoms (LUTS)   . Hypertension   . Ischemic cardiomyopathy    a. echo (01/13) 45-50%  septal akinesis, mild MR; b. Echo  (01/16) ef 60-65%, mid-apical anteroseptal hypokinesis; c. echo 09-16-2016 ef 50-55%  . Left ureteral stone   . Lung nodule    noted 08/ 2018;  followed by pcp  . Moderate mitral regurgitation    a.08/2016 Echo: Mod MR/TR.  [name] Orthostatic hypotension   . PAC (premature atrial contraction)   . Paroxysmal atrial flutter ([place])    a. dx 08/2016, started on amiodarone.  . Persistent atrial fibrillation [facility]) followed by cardiologist-- dr Rockey Situ   a. dx AFib 08/2016 s/p DCCV 10/2016-->eliquis (CHA2DS2VASc = 5).  . Pulmonary hypertension ([place])    last echo in epic 09-16-2016  . Recurrent prostate cancer ([place]) UROLOGIST-- DR BELL   s/p  radioactive prostate seed implants in 1997;    due to PSA rising pt has been doing lupron injection's few past several yrs at dr bell office  . Renal calculi    bilateral per CT 06-18-2018  . S/P CABG x 5 04/04/2005   LIMA to LAD,  SVG to Ramus,  SVG to OM,  seqSVG to PLV and PDA  . Wears glasses     Patient Active Problem List   Diagnosis Date Noted  . UTI (urinary tract infection) 04/25/2019  . Displaced fracture of right femoral neck ([place]) 10/07/2018  . Hip fracture ([place]) 10/05/2018  . Intracranial hematoma ([place]) 10/05/2018  . AAA (abdominal aortic aneurysm) ([place]) 08/26/2018  .  Hematuria 08/05/2018  . Pressure injury of skin 08/05/2018  . Kidney stones 06/30/2018  . Allergic rhinitis 04/26/2018  . Skin tear of elbow without complication [id number]  . Urolithiasis 01/05/2018  . Frequent urination 01/01/2018  . Colon polyps 09/15/2017  . GERD (gastroesophageal reflux disease) 09/15/2017  . Wrist pain, acute, right 05/25/2017  . Low back pain 03/24/2017  . Lung nodule seen on imaging study: 7 mm nodule left midlung 03/13/2017  . Physical deconditioning 03/13/2017  . Normocytic anemia 03/13/2017  . Fall 03/12/2017  . Abdominal soreness 03/12/2017  . Generalized weakness 03/12/2017  . Leukocytosis 03/12/2017  .  Hyponatremia 03/12/2017  . Orthostatic hypotension 02/16/2017  . Blood in stool 09/19/2016  . Anticoagulated 09/19/2016  . History of prostate cancer 09/04/2016  . Persistent atrial fibrillation 09/04/2016  . Cardiomyopathy, ischemic 07/16/2012  . Chronic kidney disease (CKD), stage III (moderate) ([place]) 07/16/2012  . Essential hypertension 08/05/2011  . Murmur 08/05/2011  . 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 Dyslipidemia 02/04/2011    Past Surgical History:  Procedure Laterality Date  . CARDIAC CATHETERIZATION  04/02/2005   dr Doreatha Lew    Critical three-vessel coronary disease --  Essentially normal left ventricular function , ef 55%  . CARDIOVASCULAR STRESS TEST  05-01-2008  dr Doreatha Lew /  dr harding   Low risk nuclear study w/ no ischemia or scar but flattened septal motion/  ef 64%  . CARDIOVERSION N/A 11/19/2016   Procedure: CARDIOVERSION;  Surgeon: Jerline Pain, MD;  Location: [place];  Service: Cardiovascular;  Laterality: N/A;  . COLONOSCOPY N/A 10/23/2016   Procedure: COLONOSCOPY;  Surgeon: Milus Banister, MD;  Location: [facility];  Service: Endoscopy;  Laterality: N/A;  . CORONARY ARTERY BYPASS GRAFT  04/04/2005   dr  Ricard Dillon   LIMA - LAD,  SVG - Ramus, SVG - OM,  seqSVG - PLV and PDA  . CYSTOSCOPY WITH LITHOLAPAXY N/A 05/07/2016   Procedure: CYSTOSCOPY WITH LITHOLAPAXY;  Surgeon: Rana Snare, MD;  Location[facility];  Service: Urology;  Laterality: N/A;  . CYSTOSCOPY WITH RETROGRADE PYELOGRAM, URETEROSCOPY AND STENT PLACEMENT  09/03/2018   Procedure: CYSTOSCOPY WITH RETROGRADE PYELOGRAM, URETEROSCOPY LASER LITHOTRIPSY AND STENT PLACEMENT;  Surgeon: Lucas Mallow, MD;  Location: [facility];  Service: Urology;;  . HOLMIUM LASER APPLICATION N/A [id number]   Procedure: HOLMIUM LASER APPLICATION;  Surgeon: Rana Snare, MD;  Location: [facility];  Service: Urology;  Laterality: N/A;  . INGUINAL HERNIA REPAIR Left 03-30-2005  dr Excell Seltzer    incarcerated   . RADIOACTIVE PROSTATE SEED IMPLANTS  1997  . TOTAL HIP ARTHROPLASTY Right 10/07/2018   Procedure: TOTAL HIP ARTHROPLASTY ANTERIOR APPROACH;  Surgeon: Rod Can, MD;  Location: [facility];  Service: Orthopedics;  Laterality: Right;  . TRANSTHORACIC ECHOCARDIOGRAM  08/24/2014   mid anteroseptal and distal septak hypokinetic,  mild focal basal LVH, ef 60-65%,  grade 1 diastolic dysfunction/  mild MR/  mild LAE/  mild dilated RV with normal RVSP/  trivial TR    Prior to Admission medications   Medication Sig Start Date End Date Taking? Authorizing Provider  acetaminophen (TYLENOL) 500 MG tablet Take 1,000 mg by mouth every 6 (six) hours as needed for mild pain (for pain).     [provider]  amiodarone (PACERONE) 200 MG tablet Take 1 tablet (200 mg total) by mouth daily. 07/12/18   Rise Mu, PA-C  apixaban (ELIQUIS) 5 MG TABS tablet Take 1 tablet (5 mg  total) by mouth 2 (two) times daily. 12/16/18   [name], NP  omeprazole (PRILOSEC) 40 MG capsule Take 1 capsule (40 mg total) by mouth daily. 02/02/19   [name], MD  rosuvastatin (CRESTOR) 40 MG tablet Take 1 tablet (40 mg total) by mouth daily. 06/18/18   [name], [name], MD  tamsulosin (FLOMAX) 0.4 MG CAPS capsule Take 1 capsule (0.4 mg total) by mouth daily after supper. 01/06/18   [name], MD    Allergies Ciprofloxacin  Family History  Problem Relation Age of Onset  . Heart disease Mother   . Cancer Mother   . Cancer Father   . Cancer Brother     Social History Social History   Tobacco Use  . Smoking status: Former Smoker    Years: 2.00    Types: Cigarettes    Quit date: 07/28/1960    Years since quitting: 58.8  . Smokeless tobacco: Never Used  Substance Use Topics  . Alcohol use: No  . Drug use: No    Review of Systems  Constitutional: Negative for fever. Eyes: Negative for visual changes. ENT: Negative for sore throat. Neck: No neck pain  Cardiovascular:  Negative for chest pain. Respiratory: Negative for shortness of breath. Gastrointestinal: + suprapubic abdominal pain. No vomiting or diarrhea. Genitourinary: Negative for dysuria. + urinary retention and hematuria Musculoskeletal: Negative for back pain. + L sided flank pain Skin: Negative for rash. Neurological: Negative for headaches, weakness or numbness. Psych: No SI or HI  ____________________________________________   PHYSICAL EXAM:  VITAL SIGNS: ED Triage Vitals  Enc Vitals Group     BP 06/05/19 0013 104/62     Pulse Rate 06/05/19 0013 89     Resp 06/05/19 0013 16     Temp 06/05/19 0013 97.9 F (36.6 C)     Temp Source 06/05/19 0013 Oral     SpO2 06/05/19 0013 98 %     Weight 06/05/19 0028 145 lb (65.8 kg)     Height 06/05/19 0028 5\' 10"  (1.778 m)     Head Circumference --      Peak Flow --      Pain Score 06/05/19 0028 9     Pain Loc --      Pain Edu? --      Excl. in [place]? --     Constitutional: Alert and oriented. Well appearing and in no apparent distress. HEENT:      Head: Normocephalic and atraumatic.         Eyes: Conjunctivae are normal. Sclera is non-icteric.       Mouth/Throat: Mucous membranes are moist.       Neck: Supple with no signs of meningismus. Cardiovascular: Regular rate and rhythm. No murmurs, gallops, or rubs. 2+ symmetrical distal pulses are present in all extremities. No JVD. Respiratory: Normal respiratory effort. Lungs are clear to auscultation bilaterally. No wheezes, crackles, or rhonchi.  Gastrointestinal: Soft, non tender, and non distended with positive bowel sounds. No rebound or guarding. Genitourinary: No CVA tenderness. Gross hematuria ~200cc in Foley bag Musculoskeletal: Nontender with normal range of motion in all extremities. No edema, cyanosis, or erythema of extremities. Neurologic: Normal speech and language. Face is symmetric. Moving all extremities. No gross focal neurologic deficits are appreciated. Skin: Skin is warm,  dry and intact. No rash noted. Psychiatric: Mood and affect are normal. Speech and behavior are normal.  ____________________________________________   LABS (all labs ordered are listed, but only abnormal results are displayed)  Labs Reviewed  URINALYSIS, COMPLETE (UACMP) WITH MICROSCOPIC - Abnormal; Notable for the following components:      Result Value   Color, Urine RED (*)    APPearance CLOUDY (*)    Glucose, UA   (*)    Value: TEST NOT REPORTED DUE TO COLOR INTERFERENCE OF URINE PIGMENT   Hgb urine dipstick   (*)    Value: TEST NOT REPORTED DUE TO COLOR INTERFERENCE OF URINE PIGMENT   Bilirubin Urine   (*)    Value: TEST NOT REPORTED DUE TO COLOR INTERFERENCE OF URINE PIGMENT   Ketones, ur   (*)    Value: TEST NOT REPORTED DUE TO COLOR INTERFERENCE OF URINE PIGMENT   Protein, ur   (*)    Value: TEST NOT REPORTED DUE TO COLOR INTERFERENCE OF URINE PIGMENT   Nitrite   (*)    Value: TEST NOT REPORTED DUE TO COLOR INTERFERENCE OF URINE PIGMENT   Leukocytes,Ua   (*)    Value: TEST NOT REPORTED DUE TO COLOR INTERFERENCE OF URINE PIGMENT   RBC / HPF >50 (*)    WBC, UA >50 (*)    All other components within normal limits  URINE CULTURE  CBC WITH DIFFERENTIAL/PLATELET  BASIC METABOLIC PANEL   ____________________________________________  EKG  none  ____________________________________________  RADIOLOGY  CTA: PND ____________________________________________   PROCEDURES  Procedure(s) performed: None Procedures Critical Care performed:  None ____________________________________________   INITIAL IMPRESSION / ASSESSMENT AND PLAN / ED COURSE   83 y.o. male with a history of chronic anemia, chronic kidney disease, AAA, kidney stones, remote prostate cancer CHF, a flutter/A. fib on Eliquis who presents for evaluation of urinary retention and hematuria.  Patient is hemodynamically stable with about 200 cc in Foley bag of gross hematuria.  Abdomen is soft with mild  suprapubic tenderness, no flank tenderness, no palpable pulsatile mass, no rebound or guarding.    Ddx prostate disease or cancer, bladder cancer, UTI, pyelonephritis, kidney stone, AAA.  Will check labs to rule out anemia, worsening kidney disease.  Will check UA to rule out UTI.    _________________________ 11:17 PM on 06/05/2019 -----------------------------------------  Labs showing stable hgb. CT and repeat H&H pending. Care transferred to Dr. Joan Mayans.    As part of my medical decision making, I reviewed the following data within the [place] notes reviewed and incorporated, Labs reviewed , Old chart reviewed, Patient signed out to Dr. Joan Mayans, Notes from prior ED visits and [place] Controlled Substance Database   Patient was evaluated in Emergency Department today for the symptoms described in the history of present illness. Patient was evaluated in the context of the global COVID-19 pandemic, which necessitated consideration that the patient might be at risk for infection with the SARS-CoV-2 virus that causes COVID-19. Institutional protocols and algorithms that pertain to the evaluation of patients at risk for COVID-19 are in a state of rapid change based on information released by regulatory bodies including the CDC and federal and state organizations. These policies and algorithms were followed during the patient's care in the ED.   ____________________________________________   FINAL CLINICAL IMPRESSION(S) / ED DIAGNOSES  Hematuria   NEW MEDICATIONS STARTED DURING THIS VISIT:  ED Discharge Orders    None       Note:  This document was prepared using Dragon voice recognition software and may include unintentional dictation errors.    Alfred Levins, [place], MD 06/05/19 [phone]

## 2019-06-05 NOTE — ED Notes (Signed)
MD updating family at this time.

## 2019-06-05 NOTE — ED Provider Notes (Addendum)
Initial hemoglobin 9.9, largely unchanged with baseline.  Repeat hemoglobin 9.3, stable.  UA with significant RBCs, color interference with LE and nitrites. WBCs present, but no bacteria seen.  Sent for culture.  CT: IMPRESSION:  VASCULAR  1. There is a 1.5 cm saccular aneurysm off of the right common iliac artery. Infrarenal abdominal aortic aneurysm measuring 3.1 cm.  Recommend followup by ultrasound in 3 years. This recommendation follows ACR consensus guidelines: White Paper of the ACR Incidental Findings Committee II on Vascular Findings. J Am Coll Radiol 2013;  10:789-794   NON-VASCULAR  1. Interval development of moderate right hydroureteronephrosis secondary to multiple (4) obstructing stones throughout the course of the right ureter. Additionally, there appears to be 1 stone which is recently passed into the urinary bladder.  2. Multiple stones within the atrophic left kidney.   Suspect nephrolithiasis is the likely etiology for his retention and hematuria (exacerbated by being on anticoagulation).  He is followed by Urology.  Will leave Foley catheter in place and advised follow-up with his urologist in clinic.  Flomax Rx provided.  Follow-up with vascular as well with regards to his aneurysm.  Discussed this plan with the daughter via phone, who voices understanding.  She states that he is currently on ciprofloxacin for a UTI (day 4 of 7) based on a urine sample he gave several days ago in clinic.  Given his history of atrial fibrillation along with the aortic aneurysm, will switch his ciprofloxacin to cefdinir. No fever, WBC count suggestive of sepsis or systemic toxicity. Daughter is aware of this medication switch as well.  Patient stable for discharge.     Lilia Pro., MD 06/05/19 1009

## 2019-06-05 NOTE — ED Triage Notes (Signed)
Patient presents to ED for urinary retention x 4 hours. States he has known prostate problems. Admits to passing a little blood in the urine as well.

## 2019-06-05 NOTE — ED Notes (Signed)
E-signature not working at this time. Pt verbalized understanding of D/C instructions, prescriptions and follow up care with no further questions at this time. Pt in NAD and ambulatory at time of D/C. D/C instructions discussed with daughter annette who verbalized understanding as well. No further questions at this time.

## 2019-06-05 NOTE — Discharge Instructions (Addendum)
Thank you for letting us take care of you in the emergency department today.   Please continue to take any regular, prescribed medications.   New medications we have prescribed:  - Flomax, to help with urine drainage. Take at night. This mediation can sometimes make you lightheaded when you go from laying to sitting, or sitting to standing.  Please be cautious when doing so and be sure to stay well-hydrated. - Cefdinir (Omnicef) - your new antibiotic  Please follow up with: - Urology - Vascular doctor - Your primary care doctor to review your ER visit and follow up on your symptoms.   Please return to the ER for any new or worsening symptoms.

## 2019-06-05 NOTE — ED Notes (Signed)
Bladder scan reveals >262mL in bladder.

## 2019-06-06 LAB — URINE CULTURE: Culture: NO GROWTH

## 2019-06-08 ENCOUNTER — Telehealth: Payer: Self-pay | Admitting: Cardiovascular Disease

## 2019-06-08 ENCOUNTER — Other Ambulatory Visit: Payer: Self-pay | Admitting: Urology

## 2019-06-08 NOTE — Telephone Encounter (Signed)
Patient with diagnosis of afib on Eliquis for anticoagulation.    Procedure: Ureteroscopy for kidney stone          Date of procedure: 06/15/2019  CHADS2-VASc score of  5 (CHF, HTN, AGE, CAD, AGE)  CrCl 37 ml/min  Per office protocol, patient can hold Eliquis for 2 days prior to procedure.

## 2019-06-08 NOTE — Telephone Encounter (Signed)
° °  [facility] Pre-operative Risk Assessment    Request for surgical clearance:  1. What type of surgery is being performed? Ureteroscopy for kidney stone     2. When is this surgery scheduled? 06/15/19  3. What type of clearance is required (medical clearance vs. Pharmacy clearance to hold med vs. Both)? both  4. Are there any medications that need to be held prior to surgery and how long? Eliquis please advise   5. Practice name and name of physician performing surgery? [facility] Dr. Gloriann Loan   6. What is your office phone number [phone]  Est [phone]    7.   What is your office fax number [phone]  8.   Anesthesia type (None, local, MAC, general) ? General    Clarisse Gouge 06/08/2019, 10:03 AM  _________________________________________________________________   (provider comments below)

## 2019-06-08 NOTE — Telephone Encounter (Signed)
   Primary Cardiologist: Ida Rogue, MD  Chart reviewed as part of pre-operative protocol coverage. Patient was contacted 06/08/2019 in reference to pre-operative risk assessment for pending surgery as outlined below.  [name] was last seen on 12/16/2018 by Ignacia Bayley, NP.  Since that day, Carl [name] has done fine from a cardiac standpoint. He is quite limited in mobility with significant deconditioning and now with foley catheter limiting need to make trips to the bathroom. He is not able to complete 4 METs, therefore it is difficult to assess for anginal complaints. He denies any recent chest pain or SOB at rest or with limited activity. He is quite uncomfortable with the foley catheter. We discussed the possibility of further cardiac testing prior to the procedure such as a stress test, however patient and daughter wished to get the procedure done ASAP and therefore declined further testing.   Therefore, based on ACC/AHA guidelines, the patient would be at moderate but acceptable risk for the planned procedure without further cardiovascular testing.  Per pharmacy recommendations, patient can hold eliquis 2 days prior to his upcoming urologic procedure and should restart eliquis as soon as he is cleared to do so by Dr. Gloriann Loan.    I will route this recommendation to the requesting party via Epic fax function and remove from pre-op pool.  Please call with questions.  Abigail Butts, PA-C 06/08/2019, 2:23 PM

## 2019-06-10 ENCOUNTER — Telehealth: Payer: Self-pay | Admitting: Cardiovascular Disease

## 2019-06-10 NOTE — Telephone Encounter (Signed)
I spoke with the patient's daughter, [name]. I have advised her per the patient's pre-op clearance note from Roby Lofts, [name]:  "Per pharmacy recommendations, patient can hold eliquis 2 days prior to his upcoming urologic procedure and should restart eliquis as soon as he is cleared to do so by Dr. Gloriann Loan."  [name] voices understanding of the above recommendations and is agreeable.

## 2019-06-10 NOTE — Telephone Encounter (Signed)
Patient daughter calling  Would like to clarify what patient will need to do in regards to Eliquis medication for upcoming procedure Please call to discuss

## 2019-06-11 ENCOUNTER — Other Ambulatory Visit (HOSPITAL_COMMUNITY)
Admission: RE | Admit: 2019-06-11 | Discharge: 2019-06-11 | Disposition: A | Discharge status: Home / Self Care | Payer: Medicare Other | Source: Ambulatory Visit | Attending: Urology | Admitting: Urology

## 2019-06-11 DIAGNOSIS — Z01812 Encounter for preprocedural laboratory examination: Secondary | ICD-10-CM | POA: Insufficient documentation

## 2019-06-11 DIAGNOSIS — Z20828 Contact with and (suspected) exposure to other viral communicable diseases: Secondary | ICD-10-CM | POA: Diagnosis not present

## 2019-06-12 LAB — NOVEL CORONAVIRUS, NAA (HOSP ORDER, SEND-OUT TO REF LAB; TAT 18-24 HRS): SARS-CoV-2, NAA: NOT DETECTED

## 2019-06-13 ENCOUNTER — Telehealth: Payer: Self-pay | Admitting: *Deleted

## 2019-06-13 ENCOUNTER — Encounter (HOSPITAL_COMMUNITY): Payer: Self-pay

## 2019-06-13 ENCOUNTER — Ambulatory Visit (INDEPENDENT_AMBULATORY_CARE_PROVIDER_SITE_OTHER): Payer: Medicare Other | Admitting: Pharmacist

## 2019-06-13 DIAGNOSIS — I4819 Other persistent atrial fibrillation: Secondary | ICD-10-CM

## 2019-06-13 DIAGNOSIS — N3001 Acute cystitis with hematuria: Secondary | ICD-10-CM

## 2019-06-13 NOTE — Telephone Encounter (Signed)
Copied from [place] [id number]. Topic: General - Other >> Jun 13, 2019 11:52 AM Yvette Rack wrote: Reason for CRM: Pt daughter Abhilash Emens stated she was returning the call to Dr. Ellen Henri nurse. [name] requests call back. Cb# [phone]

## 2019-06-13 NOTE — Progress Notes (Signed)
Reviewed.  Per note, having trouble bleeding.   Called and spoke to [facility].  Pt has bleeding when he urinates.  States he is seeing urology and they are planning urological procedure Wednesday.  Per cardiology recommendations, stopped eliquis yesterday.  States his urine is starting to clear.  Procedure planned for 06/15/19.  Discussed with [name] that he will need f/u after the procedure if bleeding persists.  Instructed any problems or concerns to call.    Dr Nicki Reaper

## 2019-06-13 NOTE — Patient Instructions (Signed)
Visit Information  Goals Addressed            This Visit's Progress     Patient Stated   . "We can't afford his medication" (pt-stated)       Current Barriers:  . Financial Barriers: medication barrier resolved  for 2020 - Patient APPROVED for Eliquis through 07/28/2019 o Today, his daughter, [name], notes that they have a difficult time affording food and paying utilities.  o Notes she received a packet from [facility] regarding Medicare Extra Help, but she was confused by it and did not fill it out. Unsure where it is now. Wonders if it can be re-sent to complete  . Polypharmacy, complicated patient with multiple chronic conditions including atrial fibrillation, CKD, ASCVD (hx CABG); kidney stones o Atrial fibrillation: Eliquis 5 mg BID, amiodarone 200 mg daily o ASCVD risk reduction: rosuvastaitn 40 mg daily o GERD: omeprazole 40 mg daily o Recurrent kidney stones: tamsulosin 0.4 mg daily; cytoscopy scheduled on Wednesday, patient and daughter aware of instructions for perioperative management of Eliquis -> [name] notes that "every time he starts taking Eliquis regularly, he starts bleeding". Had appointment scheduled with cardiology 03/18/2019, however, canceled d/t transportation concerns. Last seen 12/16/2018.  Pharmacist Clinical Goal(s):  [name] Over the next 90 days, patient will work with PharmD and providers for optimized medication management  Interventions: . Encouraged patient's daughter to call [facility]SA office in [place] to see if the [facility] packet/request for additional information can be re-sent. Encouraged her to let me know if she needs help completing  . Will place Care Guide referral for food and housing community resources; directly messaged Ambrose Mantle, Care Guide, as she previously worked with patient . Continue to evaluate risk vs benefit of anticoagulant therapy for atrial fibrillation. Patient does not have cardiology f/u scheduled - Will circle back with [name] after patient's  surgery to discuss scheduling follow up w/ cardiology.   Patient Self Care Activities:  . Patient will take medications as prescribed  Please see past updates related to this goal by clicking on the "Past Updates" button in the selected goal         The patient verbalized understanding of instructions provided today and declined a print copy of patient instruction materials.    Plan: - Will outreach patient in 3-4 weeks for continued medication management support  [name], PharmD, [place] Pharmacist [facility] [place] [phone]

## 2019-06-13 NOTE — Chronic Care Management (AMB) (Signed)
Chronic Care Management   Follow Up Note   06/13/2019 Name: Carl [name] MRN: [id number] DOB: Jun 13, 1936  Referred by: [name], MD Reason for referral : Chronic Care Management (Medication Management)   Carl [name] is a 83 y.o. year old male who is a primary care patient of [name], [name], MD. The CCM team was consulted for assistance with chronic disease management and care coordination needs.    Contacted patient and his daughter, Carl [name], for medication management follow up.   Review of patient status, including review of consultants reports, relevant laboratory and other test results, and collaboration with appropriate care team members and the patient's provider was performed as part of comprehensive patient evaluation and provision of chronic care management services.    SDOH (Social Determinants of Health) screening performed today: Armed forces logistics/support/administrative officer . See Care Plan for related entries.   Outpatient Encounter Medications as of 06/13/2019  Medication Sig  . acetaminophen (TYLENOL) 500 MG tablet Take 1,000 mg by mouth every 6 (six) hours as needed for mild pain (for pain).   [name] amiodarone (PACERONE) 200 MG tablet Take 1 tablet (200 mg total) by mouth daily.  [name] apixaban (ELIQUIS) 5 MG TABS tablet Take 1 tablet (5 mg total) by mouth 2 (two) times daily.  [name] HYDROcodone-acetaminophen (NORCO/VICODIN) 5-325 MG tablet Take 1 tablet by mouth every 6 (six) hours as needed for moderate pain.   [name] omeprazole (PRILOSEC) 40 MG capsule Take 1 capsule (40 mg total) by mouth daily.  . rosuvastatin (CRESTOR) 40 MG tablet Take 1 tablet (40 mg total) by mouth daily.  . tamsulosin (FLOMAX) 0.4 MG CAPS capsule Take 1 capsule (0.4 mg total) by mouth daily after supper. (Patient not taking: Reported on 06/08/2019)   No facility-administered encounter medications on file as of 06/13/2019.      Goals Addressed            This Visit's Progress     Patient  Stated   . "We can't afford his medication" (pt-stated)       Current Barriers:  . Financial Barriers: medication barrier resolved  for 2020 - Patient APPROVED for Eliquis through 07/28/2019 o Today, his daughter, Carl [name], notes that they have a difficult time affording food and paying utilities.  o Notes she received a packet from [facility] regarding Medicare Extra Help, but she was confused by it and did not fill it out. Unsure where it is now. Wonders if it can be re-sent to complete  . Polypharmacy, complicated patient with multiple chronic conditions including atrial fibrillation, CKD, ASCVD (hx CABG); kidney stones o Atrial fibrillation: Eliquis 5 mg BID, amiodarone 200 mg daily o ASCVD risk reduction: rosuvastaitn 40 mg daily o GERD: omeprazole 40 mg daily o Recurrent kidney stones: tamsulosin 0.4 mg daily; cytoscopy scheduled on Wednesday, patient and daughter aware of instructions for perioperative management of Eliquis -> Carl [name] notes that "every time he starts taking Eliquis regularly, he starts bleeding". Had appointment scheduled with cardiology 03/18/2019, however, canceled d/t transportation concerns. Last seen 12/16/2018.  Pharmacist Clinical Goal(s):  [name] Over the next 90 days, patient will work with PharmD and providers for optimized medication management  Interventions: . Encouraged patient's daughter to call [facility]SA office in [place] to see if the [facility] packet/request for additional information can be re-sent. Encouraged her to let me know if she needs help completing  . Will place Care Guide referral for food and housing community resources; directly [place], Care  Guide, as she previously worked with patient . Continue to evaluate risk vs benefit of anticoagulant therapy for atrial fibrillation. Patient does not have cardiology f/u scheduled - Will circle back with Carl [name] after patient's surgery to discuss scheduling follow up w/ cardiology.   Patient Self Care Activities:   . Patient will take medications as prescribed  Please see past updates related to this goal by clicking on the "Past Updates" button in the selected goal          Plan: - Will outreach patient in 3-4 weeks for continued medication management support  [name], PharmD, [place] Pharmacist [place] [phone]

## 2019-06-13 NOTE — Patient Instructions (Addendum)
DUE TO COVID-19 ONLY ONE VISITOR IS ALLOWED TO COME WITH YOU AND STAY IN THE WAITING ROOM ONLY DURING PRE OP AND PROCEDURE. THE ONE VISITOR MAY VISIT WITH YOU IN YOUR PRIVATE ROOM DURING VISITING HOURS ONLY!!   COVID SWAB TESTING COMPLETED ON:  Saturday, Nov. 14, 2020   Your procedure is scheduled on: Wednesday, Nov. 18, 2020   Report to [facility] Main  Entrance    Report to admitting at 6:30 AM   Call this number if you have problems the morning of surgery [phone]   Do not eat food or drink liquids :After Midnight.   Brush your teeth the morning of surgery.   Do NOT smoke after Midnight   Take these medicines the morning of surgery with A SIP OF WATER: Amiodaraone, Omeprazole, Rosuvastatin                               You may not have any metal on your body including jewelry, and body piercings             Do not wear lotions, powders, perfumes/cologne, or deodorant                          Men may shave face and neck.   Do not bring valuables to the hospital. [place].   Contacts, dentures or bridgework may not be worn into surgery.    Patients discharged the day of surgery will not be allowed to drive home.   Special Instructions: Bring a copy of your healthcare power of attorney and living will documents         the day of surgery if you haven't scanned them in before.              Please read over the following fact sheets you were given:  [facility] - Preparing for Surgery Before surgery, you can play an important role.  Because skin is not sterile, your skin needs to be as free of germs as possible.  You can reduce the number of germs on your skin by washing with CHG (chlorahexidine gluconate) soap before surgery.  CHG is an antiseptic cleaner which kills germs and bonds with the skin to continue killing germs even after washing. Please DO NOT use if you have an allergy to CHG or antibacterial soaps.  If  your skin becomes reddened/irritated stop using the CHG and inform your nurse when you arrive at Short Stay. Do not shave (including legs and underarms) for at least 48 hours prior to the first CHG shower.  You may shave your face/neck.  Please follow these instructions carefully:  1.  Shower with CHG Soap the night before surgery and the  morning of surgery.  2.  If you choose to wash your hair, wash your hair first as usual with your normal  shampoo.  3.  After you shampoo, rinse your hair and body thoroughly to remove the shampoo.                             4.  Use CHG as you would any other liquid soap.  You can apply chg directly to the skin and wash.  Gently with a scrungie or clean  washcloth.  5.  Apply the CHG Soap to your body ONLY FROM THE NECK DOWN.   Do   not use on face/ open                           Wound or open sores. Avoid contact with eyes, ears mouth and   genitals (private parts).                       Wash face,  Genitals (private parts) with your normal soap.             6.  Wash thoroughly, paying special attention to the area where your    surgery  will be performed.  7.  Thoroughly rinse your body with warm water from the neck down.  8.  DO NOT shower/wash with your normal soap after using and rinsing off the CHG Soap.                9.  Pat yourself dry with a clean towel.            10.  Wear clean pajamas.            11.  Place clean sheets on your bed the night of your first shower and do not  sleep with pets. Day of Surgery : Do not apply any lotions/deodorants the morning of surgery.  Please wear clean clothes to the hospital/surgery center.  FAILURE TO FOLLOW THESE INSTRUCTIONS MAY RESULT IN THE CANCELLATION OF YOUR SURGERY  PATIENT SIGNATURE_________________________________  NURSE SIGNATURE__________________________________  ________________________________________________________________________

## 2019-06-13 NOTE — Telephone Encounter (Signed)
I called and informed the patients daughter that I did not call her today and she stated that it was catie travis and she had spoken with her.  Nina,cma

## 2019-06-14 ENCOUNTER — Other Ambulatory Visit: Payer: Self-pay

## 2019-06-14 ENCOUNTER — Telehealth: Payer: Self-pay

## 2019-06-14 ENCOUNTER — Encounter (HOSPITAL_COMMUNITY): Payer: Self-pay

## 2019-06-14 ENCOUNTER — Encounter (HOSPITAL_COMMUNITY)
Admission: RE | Admit: 2019-06-14 | Discharge: 2019-06-14 | Disposition: A | Discharge status: Home / Self Care | Payer: Medicare Other | Source: Ambulatory Visit | Attending: Urology | Admitting: Urology

## 2019-06-14 ENCOUNTER — Ambulatory Visit (HOSPITAL_COMMUNITY): Admission: RE | Admit: 2019-06-14 | Payer: Medicare Other | Source: Ambulatory Visit

## 2019-06-14 DIAGNOSIS — K219 Gastro-esophageal reflux disease without esophagitis: Secondary | ICD-10-CM | POA: Insufficient documentation

## 2019-06-14 DIAGNOSIS — Z01812 Encounter for preprocedural laboratory examination: Secondary | ICD-10-CM | POA: Diagnosis present

## 2019-06-14 DIAGNOSIS — E785 Hyperlipidemia, unspecified: Secondary | ICD-10-CM | POA: Diagnosis not present

## 2019-06-14 DIAGNOSIS — I4819 Other persistent atrial fibrillation: Secondary | ICD-10-CM | POA: Insufficient documentation

## 2019-06-14 DIAGNOSIS — Z79899 Other long term (current) drug therapy: Secondary | ICD-10-CM | POA: Insufficient documentation

## 2019-06-14 DIAGNOSIS — I4892 Unspecified atrial flutter: Secondary | ICD-10-CM | POA: Diagnosis not present

## 2019-06-14 DIAGNOSIS — I129 Hypertensive chronic kidney disease with stage 1 through stage 4 chronic kidney disease, or unspecified chronic kidney disease: Secondary | ICD-10-CM | POA: Diagnosis not present

## 2019-06-14 DIAGNOSIS — D638 Anemia in other chronic diseases classified elsewhere: Secondary | ICD-10-CM | POA: Insufficient documentation

## 2019-06-14 DIAGNOSIS — N211 Calculus in urethra: Secondary | ICD-10-CM | POA: Insufficient documentation

## 2019-06-14 DIAGNOSIS — R011 Cardiac murmur, unspecified: Secondary | ICD-10-CM | POA: Insufficient documentation

## 2019-06-14 DIAGNOSIS — I2510 Atherosclerotic heart disease of native coronary artery without angina pectoris: Secondary | ICD-10-CM | POA: Insufficient documentation

## 2019-06-14 DIAGNOSIS — N183 Chronic kidney disease, stage 3 unspecified: Secondary | ICD-10-CM | POA: Insufficient documentation

## 2019-06-14 DIAGNOSIS — Z951 Presence of aortocoronary bypass graft: Secondary | ICD-10-CM | POA: Insufficient documentation

## 2019-06-14 DIAGNOSIS — Z87891 Personal history of nicotine dependence: Secondary | ICD-10-CM | POA: Insufficient documentation

## 2019-06-14 DIAGNOSIS — R310 Gross hematuria: Secondary | ICD-10-CM | POA: Insufficient documentation

## 2019-06-14 HISTORY — DX: Personal history of colonic polyps: Z86.010

## 2019-06-14 HISTORY — DX: Pneumonia, unspecified organism: J18.9

## 2019-06-14 HISTORY — DX: Presence of other specified devices: Z97.8

## 2019-06-14 HISTORY — DX: Personal history of diseases of the skin and subcutaneous tissue: Z87.2

## 2019-06-14 HISTORY — DX: Personal history of colon polyps, unspecified: Z86.0100

## 2019-06-14 HISTORY — DX: Cardiac murmur, unspecified: R01.1

## 2019-06-14 LAB — BASIC METABOLIC PANEL
Anion gap: 9 (ref 5–15)
BUN: 16 mg/dL (ref 8–23)
CO2: 24 mmol/L (ref 22–32)
Calcium: 8.3 mg/dL — ABNORMAL LOW (ref 8.9–10.3)
Chloride: 104 mmol/L (ref 98–111)
Creatinine, Ser: 1.17 mg/dL (ref 0.61–1.24)
GFR calc Af Amer: >60 mL/min (ref >60)
GFR calc non Af Amer: 58 mL/min — ABNORMAL LOW (ref >60)
Glucose, Bld: 112 mg/dL — ABNORMAL HIGH (ref 70–99)
Potassium: 3.4 mmol/L — ABNORMAL LOW (ref 3.5–5.1)
Sodium: 137 mmol/L (ref 135–145)

## 2019-06-14 LAB — CBC
HCT: 33.5 % — ABNORMAL LOW (ref 39.0–52.0)
Hemoglobin: 10 g/dL — ABNORMAL LOW (ref 13.0–17.0)
MCH: 29.5 pg (ref 26.0–34.0)
MCHC: 29.9 g/dL — ABNORMAL LOW (ref 30.0–36.0)
MCV: 98.8 fL (ref 80.0–100.0)
Platelets: 188 10*3/uL (ref 150–400)
RBC: 3.39 MIL/uL — ABNORMAL LOW (ref 4.22–5.81)
RDW: 14.7 % (ref 11.5–15.5)
WBC: 14.1 10*3/uL — ABNORMAL HIGH (ref 4.0–10.5)
nRBC: 0 % (ref 0.0–0.2)

## 2019-06-14 NOTE — Telephone Encounter (Signed)
Copied from [place] [id number]. Topic: Referral - Status >> Jun 14, 2019  [id number] PM Simone Curia D wrote: [id number] Spoke with patient's daughter Carl [name] per signed DPR.  She was unable to talk and requested that I call her again on Thursday. Will call [name] on Thursday per request.  Ambrose Mantle [phone]

## 2019-06-14 NOTE — Progress Notes (Addendum)
Mr. Ledsome stated he has a stomach virus he started having diarrhea accompanied with stomach pain and loss of appetite Monday, Nov. 16, 2020. D[place]et and son in law also verified this information.  Lenise Arena. P.A. made aware.  Also spoke with Pam at [facility] and made her aware.

## 2019-06-14 NOTE — Anesthesia Preprocedure Evaluation (Addendum)
Anesthesia Evaluation  Patient identified by MRN, date of birth, ID band Patient awake    Reviewed: Allergy & Precautions, NPO status , Patient's Chart, lab work & pertinent test results  History of Anesthesia Complications Negative for: history of anesthetic complications  Airway Mallampati: III  TM Distance: >3 FB Neck ROM: Full    Dental  (+) Dental Advisory Given, Missing   Pulmonary former smoker,    Pulmonary exam normal        Cardiovascular hypertension, (-) angina+ CAD, + CABG and +CHF  Normal cardiovascular exam+ dysrhythmias Atrial Fibrillation    Cardiac clearance received 06/08/2019.  Per Roby Lofts, PA-C, "Carl [name] last seen on 5/21/2020by Ignacia Bayley, NP. Since that day, Carl Sixtos Hamiltonhas done fine from a cardiac standpoint.He is quite limited in mobility with significant deconditioning and now with foley catheter limiting need to make trips to the bathroom. He is not able to complete 4 METs, therefore it is difficult to assess for anginal complaints. He denies any recent chest pain or SOB at rest or with limited activity. He is quite uncomfortable with the foley catheter. We discussed the possibility of further cardiac testing prior to the procedure such as a stress test, however patient and daughter wished to get the procedure done ASAP and therefore declined further testing.Therefore, based on ACC/AHA guidelines, the patient would be atmoderate butacceptable risk for the planned procedure without further cardiovascular testing. Per pharmacy recommendations, patient can hold eliquis 2 days prior to his upcoming urologic procedure and should restart eliquis as soon as he is cleared to do so by Dr. Gloriann Loan."    Neuro/Psych PSYCHIATRIC DISORDERS Anxiety negative neurological ROS     GI/Hepatic Neg liver ROS, GERD  Medicated and Controlled,  Endo/Other  negative endocrine ROS  Renal/GU CRFRenal  disease Recurrent stones      Musculoskeletal  (+) Arthritis ,   Abdominal   Peds  Hematology  (+) anemia ,   Anesthesia Other Findings   Reproductive/Obstetrics                           Anesthesia Physical Anesthesia Plan  ASA: III  Anesthesia Plan: General   Post-op Pain Management:    Induction: Intravenous  PONV Risk Score and Plan: 3 and Treatment may vary due to age or medical condition and Ondansetron  Airway Management Planned: LMA  Additional Equipment: None  Intra-op Plan:   Post-operative Plan: Extubation in OR  Informed Consent: I have reviewed the patients History and Physical, chart, labs and discussed the procedure including the risks, benefits and alternatives for the proposed anesthesia with the patient or authorized representative who has indicated his/her understanding and acceptance.     Dental advisory given  Plan Discussed with: CRNA and Anesthesiologist  Anesthesia Plan Comments:       Anesthesia Quick Evaluation

## 2019-06-14 NOTE — Pre-Procedure Instructions (Addendum)
PCP - Dr. Cornelius Moras. Last office visit 04/25/2019 Cardiologist - Dr. Johnny Bridge. Cardiac clearance 06/08/2019  Chest x-ray - 02/25/2019. Epic EKG - 03/02/2019 Stress Test -  ECHO - 09/16/2016 Cardiac Cath -   Sleep Study -  CPAP -   Fasting Blood Sugar -  Checks Blood Sugar _____ times a day  Blood Thinner Instructions: Eliquis . Last day 06/13/2019 Aspirin Instructions:NA Last Dose:NA  Anesthesia review: CABG,Afib,MI,AAA,Ischemic cardiomyopathy,CKD,pulmonary HTN  Patient denies shortness of breath, fever, cough and chest pain at PAT appointment   Patient verbalized understanding of instructions that were given to them at the PAT appointment. Patient was also instructed that they will need to review over the PAT instructions again at home before surgery.

## 2019-06-14 NOTE — Progress Notes (Signed)
Anesthesia Chart Review   Case: [id number] Date/Time: 06/15/19 0815   Procedures:      CYSTOSCOPY RIGHT URETEROSCOPY HOLMIUM LASER STENT PLACEMENT (Right )     POSSIBLE  BLADDER BIOPSY FULGURATION (N/A )   Anesthesia type: General   Pre-op diagnosis: RIGHT URETERAL STONE GROSS HEMATURIA   Location: [facility]   Surgeon: Lucas Mallow, MD      DISCUSSION:83 y.o. former smoker (quit 07/28/60) with h/o HTN, HLD, GERD, PAF, CAD, ischemic cardiomyopathy, pulmonary HTN, CKD, right ureteral stone, gross hematuria scheduled for above procedure 06/15/2019 with Dr. Link Snuffer.    At PAT visit pt family reports he began experiencing diarrhea, abdominal discomfort, lack of appetite and fatigue yesterday.  COVID negative 06/11/2019, asymptomatic at that time.  Discussed with Dr. Valma Cava.  Pt will need repeat COVID test before proceeding.  Discussed with Evonnie Pat who reports if surgeon wishes to proceed a rapid test will need to be ordered DOS by house coverage.  Dr. Gloriann Loan wishes to proceed, short stay made aware.    Cardiac clearance received 06/08/2019.  Per Roby Lofts, PA-C, "[name] was last seen on 12/16/2018 by Ignacia Bayley, NP.  Since that day, ELISIAH SCHETTER has done fine from a cardiac standpoint. He is quite limited in mobility with significant deconditioning and now with foley catheter limiting need to make trips to the bathroom. He is not able to complete 4 METs, therefore it is difficult to assess for anginal complaints. He denies any recent chest pain or SOB at rest or with limited activity. He is quite uncomfortable with the foley catheter. We discussed the possibility of further cardiac testing prior to the procedure such as a stress test, however patient and daughter wished to get the procedure done ASAP and therefore declined further testing.   Therefore, based on ACC/AHA guidelines, the patient would be at moderate but acceptable risk for the planned  procedure without further cardiovascular testing.  Per pharmacy recommendations, patient can hold eliquis 2 days prior to his upcoming urologic procedure and should restart eliquis as soon as he is cleared to do so by Dr. Gloriann Loan."  Surgeon made aware of current symptoms.  Pt needs rapid COVID test DOS, discussed with short stay and Dr. Valma Cava. Anticipate pt can proceed with planned procedure barring acute status change.   VS: BP (!) 142/70   Pulse 88   Temp 36.7 C (Oral)   Resp 18   Ht 5\' 10"  (1.778 m)   Wt 65.4 kg   SpO2 99%   BMI 20.69 kg/m   PROVIDERS: [name], MD  Ida Rogue, MD is Cardiologist  LABS: Labs reviewed: Acceptable for surgery. (all labs ordered are listed, but only abnormal results are displayed)  Labs Reviewed  CBC - Abnormal; Notable for the following components:      Result Value   WBC 14.1 (*)    RBC 3.39 (*)    Hemoglobin 10.0 (*)    HCT 33.5 (*)    MCHC 29.9 (*)    All other components within normal limits  BASIC METABOLIC PANEL - Abnormal; Notable for the following components:   Potassium 3.4 (*)    Glucose, Bld 112 (*)    Calcium 8.3 (*)    GFR calc non Af Amer 58 (*)    All other components within normal limits     IMAGES:   EKG: 03/02/2019 Rate 70 bpm Sinus or ectopic atrial rhythm with prolonged  AV conduction Prolonged QT interval Nonspecific T wave abnormality  CV: Echo 09/16/2016 Study Conclusions  - Left ventricle: The cavity size was normal. Wall thickness was   increased in a pattern of moderate LVH. Systolic function was   normal. The estimated ejection fraction was in the range of 50%   to 55%. The study is not technically sufficient to allow   evaluation of LV diastolic function. - Ventricular septum: Septal motion showed paradox. - Mitral valve: There was moderate regurgitation. - Left atrium: The appendage was severely dilated. - Right ventricle: The cavity size was moderately dilated. - Right atrium:  The atrium was moderately dilated. - Atrial septum: No defect or patent foramen ovale was identified. - Tricuspid valve: There was moderate regurgitation. - Pulmonary arteries: PA peak pressure: 62 mm Hg (S). Past Medical History:  Diagnosis Date  . (HFpEF) heart failure with preserved ejection fraction ([place])    a. 08/2016 Echo: EF 50-55%.  [name] AAA (abdominal aortic aneurysm) ([place]) 06/05/2019   abdominal aortic aneurysm measuring 3.1 cm.  . Arthritis    arms  . Bladder calculus   . CHF (congestive heart failure) ([place])   . Chronic anemia   . Chronic anxiety   . CKD (chronic kidney disease), stage III   . Coronary artery disease CARDIOLOGIST-  DR Rockey Situ   a. CABG 2006 with  LIMA-LAD, SVG-Ramus, SVG-OM, seq SVG-PLV-PDA.  . DOE (dyspnea on exertion)   . GERD (gastroesophageal reflux disease)   . Heart murmur   . Hematuria   . History of bladder stone   . History of colon polyps   . History of non-ST elevation myocardial infarction (NSTEMI) 03/30/2005   post op recovery inguinal hernia repair  . History of pressure ulcer   . History of recurrent UTIs    last admission 08-04-2018 pseudomonas UTI  . Hyperlipidemia   . Hyperplasia of prostate without lower urinary tract symptoms (LUTS)   . Hypertension   . Indwelling Foley catheter present   . Ischemic cardiomyopathy    a. echo (01/13) 45-50% septal akinesis, mild MR; b. Echo  (01/16) ef 60-65%, mid-apical anteroseptal hypokinesis; c. echo 09-16-2016 ef 50-55%  . Left ureteral stone   . Lung nodule    noted 08/ 2018;  followed by pcp  . LVH (left ventricular hypertrophy) 09/16/2016   Moderate noted on ECHO  . Moderate mitral regurgitation    a.08/2016 Echo: Mod MR/TR.  [name] Orthostatic hypotension   . PAC (premature atrial contraction)   . Paroxysmal atrial flutter ([place])    a. dx 08/2016, started on amiodarone.  . Persistent atrial fibrillation [facility]) followed by cardiologist-- dr Rockey Situ   a. dx AFib 08/2016 s/p DCCV 10/2016-->eliquis  (CHA2DS2VASc = 5).  . Pneumonia   . Pulmonary hypertension ([place])    last echo in epic 09-16-2016  . Recurrent prostate cancer ([place]) UROLOGIST-- DR BELL   s/p  radioactive prostate seed implants in 1997;    due to PSA rising pt has been doing lupron injection's few past several yrs at dr bell office  . Renal calculi    bilateral per CT 06-18-2018  . S/P CABG x 5 04/04/2005   LIMA to LAD,  SVG to Ramus,  SVG to OM,  seqSVG to PLV and PDA  . Wears glasses     Past Surgical History:  Procedure Laterality Date  . CARDIAC CATHETERIZATION  04/02/2005   dr Doreatha Lew    Critical three-vessel coronary disease --  Essentially normal left ventricular function ,  ef 55%  . CARDIOVASCULAR STRESS TEST  05-01-2008  dr Doreatha Lew /  dr harding   Low risk nuclear study w/ no ischemia or scar but flattened septal motion/  ef 64%  . CARDIOVERSION N/A 11/19/2016   Procedure: CARDIOVERSION;  Surgeon: Jerline Pain, MD;  Location: [facility];  Service: Cardiovascular;  Laterality: N/A;  . COLONOSCOPY N/A 10/23/2016   Procedure: COLONOSCOPY;  Surgeon: Milus Banister, MD;  Location: [facility];  Service: Endoscopy;  Laterality: N/A;  . CORONARY ARTERY BYPASS GRAFT  04/04/2005   dr  Ricard Dillon   LIMA - LAD,  SVG - Ramus, SVG - OM,  seqSVG - PLV and PDA  . CYSTOSCOPY WITH LITHOLAPAXY N/A 05/07/2016   Procedure: CYSTOSCOPY WITH LITHOLAPAXY;  Surgeon: Rana Snare, MD;  Location: [facility];  Service: Urology;  Laterality: N/A;  . CYSTOSCOPY WITH RETROGRADE PYELOGRAM, URETEROSCOPY AND STENT PLACEMENT  09/03/2018   Procedure: CYSTOSCOPY WITH RETROGRADE PYELOGRAM, URETEROSCOPY LASER LITHOTRIPSY AND STENT PLACEMENT;  Surgeon: Lucas Mallow, MD;  Location: [facility];  Service: Urology;;  . HOLMIUM LASER APPLICATION N/A [id number]   Procedure: HOLMIUM LASER APPLICATION;  Surgeon: Rana Snare, MD;  Location: [facility];  Service: Urology;  Laterality: N/A;  . INGUINAL HERNIA REPAIR Left  03-30-2005  dr Excell Seltzer   incarcerated   . RADIOACTIVE PROSTATE SEED IMPLANTS  1997  . TOTAL HIP ARTHROPLASTY Right 10/07/2018   Procedure: TOTAL HIP ARTHROPLASTY ANTERIOR APPROACH;  Surgeon: Rod Can, MD;  Location: [facility];  Service: Orthopedics;  Laterality: Right;  . TRANSTHORACIC ECHOCARDIOGRAM  08/24/2014   mid anteroseptal and distal septak hypokinetic,  mild focal basal LVH, ef 60-65%,  grade 1 diastolic dysfunction/  mild MR/  mild LAE/  mild dilated RV with normal RVSP/  trivial TR    MEDICATIONS: . acetaminophen (TYLENOL) 500 MG tablet  . amiodarone (PACERONE) 200 MG tablet  . apixaban (ELIQUIS) 5 MG TABS tablet  . HYDROcodone-acetaminophen (NORCO/VICODIN) 5-325 MG tablet  . omeprazole (PRILOSEC) 40 MG capsule  . rosuvastatin (CRESTOR) 40 MG tablet  . tamsulosin (FLOMAX) 0.4 MG CAPS capsule   No current facility-administered medications for this encounter.     Maia Plan [facility] Pre-Surgical Testing [phone] 06/14/19  4:27 PM

## 2019-06-15 ENCOUNTER — Encounter (HOSPITAL_COMMUNITY): Payer: Self-pay

## 2019-06-15 ENCOUNTER — Encounter (HOSPITAL_COMMUNITY)
Admission: RE | Disposition: A | Discharge status: Home / Self Care | Payer: Self-pay | Source: Home / Self Care | Attending: Urology

## 2019-06-15 ENCOUNTER — Ambulatory Visit (HOSPITAL_COMMUNITY): Payer: Medicare Other | Admitting: Anesthesiology

## 2019-06-15 ENCOUNTER — Ambulatory Visit (HOSPITAL_COMMUNITY)
Admission: RE | Admit: 2019-06-15 | Discharge: 2019-06-15 | Disposition: A | Discharge status: Home / Self Care | Payer: Medicare Other | Attending: Urology | Admitting: Urology

## 2019-06-15 ENCOUNTER — Ambulatory Visit (HOSPITAL_COMMUNITY): Payer: Medicare Other | Admitting: Physician Assistant

## 2019-06-15 ENCOUNTER — Ambulatory Visit (HOSPITAL_COMMUNITY): Payer: Medicare Other

## 2019-06-15 DIAGNOSIS — N201 Calculus of ureter: Secondary | ICD-10-CM | POA: Diagnosis present

## 2019-06-15 DIAGNOSIS — K219 Gastro-esophageal reflux disease without esophagitis: Secondary | ICD-10-CM | POA: Diagnosis not present

## 2019-06-15 DIAGNOSIS — Z87891 Personal history of nicotine dependence: Secondary | ICD-10-CM | POA: Insufficient documentation

## 2019-06-15 DIAGNOSIS — I110 Hypertensive heart disease with heart failure: Secondary | ICD-10-CM | POA: Diagnosis not present

## 2019-06-15 DIAGNOSIS — I4891 Unspecified atrial fibrillation: Secondary | ICD-10-CM | POA: Insufficient documentation

## 2019-06-15 DIAGNOSIS — I509 Heart failure, unspecified: Secondary | ICD-10-CM | POA: Insufficient documentation

## 2019-06-15 DIAGNOSIS — I2510 Atherosclerotic heart disease of native coronary artery without angina pectoris: Secondary | ICD-10-CM | POA: Diagnosis not present

## 2019-06-15 DIAGNOSIS — Z7901 Long term (current) use of anticoagulants: Secondary | ICD-10-CM | POA: Insufficient documentation

## 2019-06-15 DIAGNOSIS — Z79899 Other long term (current) drug therapy: Secondary | ICD-10-CM | POA: Insufficient documentation

## 2019-06-15 DIAGNOSIS — Z8546 Personal history of malignant neoplasm of prostate: Secondary | ICD-10-CM | POA: Insufficient documentation

## 2019-06-15 DIAGNOSIS — Z7951 Long term (current) use of inhaled steroids: Secondary | ICD-10-CM | POA: Insufficient documentation

## 2019-06-15 DIAGNOSIS — Z20828 Contact with and (suspected) exposure to other viral communicable diseases: Secondary | ICD-10-CM | POA: Diagnosis not present

## 2019-06-15 DIAGNOSIS — N132 Hydronephrosis with renal and ureteral calculous obstruction: Secondary | ICD-10-CM | POA: Insufficient documentation

## 2019-06-15 DIAGNOSIS — E7800 Pure hypercholesterolemia, unspecified: Secondary | ICD-10-CM | POA: Insufficient documentation

## 2019-06-15 HISTORY — PX: CYSTOSCOPY/URETEROSCOPY/HOLMIUM LASER/STENT PLACEMENT: SHX6546

## 2019-06-15 LAB — SARS CORONAVIRUS 2 BY RT PCR (HOSPITAL ORDER, PERFORMED IN ~~LOC~~ HOSPITAL LAB): SARS Coronavirus 2: NEGATIVE

## 2019-06-15 SURGERY — CYSTOSCOPY/URETEROSCOPY/HOLMIUM LASER/STENT PLACEMENT
Anesthesia: General | Laterality: Right

## 2019-06-15 MED ORDER — PROPOFOL 10 MG/ML IV BOLUS
INTRAVENOUS | Status: DC | PRN
  Administered 2019-06-15: 90 mg via INTRAVENOUS

## 2019-06-15 MED ORDER — HYDROCODONE-ACETAMINOPHEN 5-325 MG PO TABS: 1.0000 | ORAL_TABLET | ORAL | 0 refills | Status: DC | PRN

## 2019-06-15 MED ORDER — FENTANYL CITRATE (PF) 100 MCG/2ML IJ SOLN
25.0000 ug | INTRAMUSCULAR | Status: DC | PRN
  Administered 2019-06-15 (×2): 50 ug via INTRAVENOUS

## 2019-06-15 MED ORDER — OXYCODONE HCL 5 MG PO TABS: 5.0000 mg | ORAL_TABLET | Freq: Once | ORAL | Status: DC | PRN

## 2019-06-15 MED ORDER — LACTATED RINGERS IV SOLN
INTRAVENOUS | Status: DC
  Administered 2019-06-15: 07:00:00 via INTRAVENOUS

## 2019-06-15 MED ORDER — ONDANSETRON HCL 4 MG/2ML IJ SOLN: 4.0000 mg | Freq: Once | INTRAMUSCULAR | Status: DC | PRN

## 2019-06-15 MED ORDER — 0.9 % SODIUM CHLORIDE (POUR BTL) OPTIME
TOPICAL | Status: DC | PRN
  Administered 2019-06-15: 1000 mL

## 2019-06-15 MED ORDER — FENTANYL CITRATE (PF) 100 MCG/2ML IJ SOLN
INTRAMUSCULAR | Status: AC
  Filled 2019-06-15: qty 2

## 2019-06-15 MED ORDER — SODIUM CHLORIDE 0.9 % IV SOLN
1.0000 g | Freq: Once | INTRAVENOUS | Status: AC
  Administered 2019-06-15: 1 g via INTRAVENOUS
  Filled 2019-06-15: qty 1

## 2019-06-15 MED ORDER — OXYCODONE HCL 5 MG/5ML PO SOLN: 5.0000 mg | Freq: Once | ORAL | Status: DC | PRN

## 2019-06-15 MED ORDER — PHENYLEPHRINE 40 MCG/ML (10ML) SYRINGE FOR IV PUSH (FOR BLOOD PRESSURE SUPPORT)
PREFILLED_SYRINGE | INTRAVENOUS | Status: DC | PRN
  Administered 2019-06-15: 80 ug via INTRAVENOUS
  Administered 2019-06-15 (×2): 40 ug via INTRAVENOUS

## 2019-06-15 MED ORDER — PROPOFOL 10 MG/ML IV BOLUS
INTRAVENOUS | Status: AC
  Filled 2019-06-15: qty 20

## 2019-06-15 MED ORDER — SODIUM CHLORIDE 0.9 % IR SOLN
Status: DC | PRN
  Administered 2019-06-15: 3000 mL

## 2019-06-15 MED ORDER — FENTANYL CITRATE (PF) 100 MCG/2ML IJ SOLN
INTRAMUSCULAR | Status: DC | PRN
  Administered 2019-06-15 (×3): 25 ug via INTRAVENOUS

## 2019-06-15 MED ORDER — LIDOCAINE 2% (20 MG/ML) 5 ML SYRINGE
INTRAMUSCULAR | Status: DC | PRN
  Administered 2019-06-15: 50 mg via INTRAVENOUS

## 2019-06-15 MED ORDER — ONDANSETRON HCL 4 MG/2ML IJ SOLN
INTRAMUSCULAR | Status: DC | PRN
  Administered 2019-06-15: 4 mg via INTRAVENOUS

## 2019-06-15 SURGICAL SUPPLY — 30 items
BAG DRN RND TRDRP ANRFLXCHMBR (UROLOGICAL SUPPLIES)
BAG URINE DRAIN 2000ML AR STRL (UROLOGICAL SUPPLIES) IMPLANT
BAG URO CATCHER STRL LF (MISCELLANEOUS) ×3 IMPLANT
BASKET LASER NITINOL 1.9FR (BASKET) IMPLANT
BASKET ZERO TIP NITINOL 2.4FR (BASKET) ×2 IMPLANT
BSKT STON RTRVL 120 1.9FR (BASKET)
BSKT STON RTRVL ZERO TP 2.4FR (BASKET) ×2
CATH FOLEY 2WAY SLVR  5CC 18FR (CATHETERS)
CATH FOLEY 2WAY SLVR 5CC 18FR (CATHETERS) IMPLANT
CATH INTERMIT  6FR 70CM (CATHETERS) ×3 IMPLANT
CLOTH BEACON ORANGE TIMEOUT ST (SAFETY) ×3 IMPLANT
ELECT REM PT RETURN 15FT ADLT (MISCELLANEOUS) ×1 IMPLANT
EXTRACTOR STONE 1.7FRX115CM (UROLOGICAL SUPPLIES) IMPLANT
FIBER LASER FLEXIVA 365 (UROLOGICAL SUPPLIES) IMPLANT
FIBER LASER TRAC TIP (UROLOGICAL SUPPLIES) ×2 IMPLANT
GLOVE BIO SURGEON STRL SZ7.5 (GLOVE) ×3 IMPLANT
GOWN STRL REUS W/TWL XL LVL3 (GOWN DISPOSABLE) ×3 IMPLANT
GUIDEWIRE ANG ZIPWIRE 038X150 (WIRE) IMPLANT
GUIDEWIRE STR DUAL SENSOR (WIRE) ×3 IMPLANT
KIT TURNOVER KIT A (KITS) IMPLANT
LOOP CUT BIPOLAR 24F LRG (ELECTROSURGICAL) IMPLANT
MANIFOLD NEPTUNE II (INSTRUMENTS) ×3 IMPLANT
PACK CYSTO (CUSTOM PROCEDURE TRAY) ×3 IMPLANT
PLUG CATH AND CAP STER (CATHETERS) IMPLANT
SHEATH URETERAL 12FRX28CM (UROLOGICAL SUPPLIES) IMPLANT
SHEATH URETERAL 12FRX35CM (MISCELLANEOUS) IMPLANT
STENT CONTOUR 6FRX26X.038 (STENTS) ×2 IMPLANT
SYRINGE IRR TOOMEY STRL 70CC (SYRINGE) IMPLANT
TUBING CONNECTING 10 (TUBING) ×3 IMPLANT
TUBING UROLOGY SET (TUBING) ×3 IMPLANT

## 2019-06-15 NOTE — Anesthesia Procedure Notes (Signed)
Procedure Name: LMA Insertion Date/Time: 06/15/2019 9:43 AM Performed by: Anne Fu, CRNA Pre-anesthesia Checklist: Patient identified, Emergency Drugs available, Suction available, Patient being monitored and Timeout performed Patient Re-evaluated:Patient Re-evaluated prior to induction Oxygen Delivery Method: Circle system utilized Preoxygenation: Pre-oxygenation with 100% oxygen Induction Type: IV induction Ventilation: Mask ventilation without difficulty LMA: LMA inserted LMA Size: 4.0 Number of attempts: 1 Placement Confirmation: positive ETCO2 and breath sounds checked- equal and bilateral Tube secured with: Tape

## 2019-06-15 NOTE — Op Note (Signed)
Operative Note  Preoperative diagnosis:  1.  Right ureteral calculi  Postoperative diagnosis: 1.  Right ureteral calculi  Procedure(s): 1.  Cystoscopy with right retrograde pyelogram, right ureteroscopy with laser lithotripsy, basket stone extraction, ureteral stent placement  Surgeon: Link Snuffer, MD  Assistants: None  Anesthesia: General  Complications: None immediate  EBL: Minimal  Specimens: 1.  None  Drains/Catheters: 1.  6 x 26 double-J ureteral stent  Intraoperative findings: 1.  Normal anterior urethra 2.  Borderline obstructing prostate 3.  Bilateral ureteral orifices were normal.  Bladder mucosa had some moderate trabeculation but no tumors.  Right distal ureter had multiple stacked stones up to about 1 cm in size.  These were all lasered to smaller fragments and then basket extracted until the ureter was clear.  Retrograde pyelogram revealed hydroureteronephrosis.  Indication: 83 year old male with multiple medical comorbidities was found to have right ureteral calculi and presents for the previously mentioned operation.  Description of procedure:  The patient was identified and consent was obtained.  The patient was taken to the operating room and placed in the supine position.  The patient was placed under general anesthesia.  Perioperative antibiotics were administered.  The patient was placed in dorsal lithotomy.  Patient was prepped and draped in a standard sterile fashion and a timeout was performed.  A 21 French rigid cystoscope was advanced into the urethra and into the bladder.  Complete cystoscopy was performed with no tumors found.  The right ureter was cannulated with a sensor wire which was advanced up to the kidney under fluoroscopic guidance.  A semirigid ureteroscope was advanced alongside the wire up to the distal ureter and all stone fragments were fragmented to smaller fragments.  I then used a basket to basket extract all the fragments.  I then  advanced the scope up to the renal pelvis.  No other ureteral calculi were seen.  I shot a retrograde pyelogram with the findings noted above.  I withdrew the scope and did not see any clinically significant ureteral calculi.  Backloaded the wire onto a rigid cystoscope and advanced that into the bladder followed by routine placement of a 6 x 26 double-J ureteral stent.  Fluoroscopy confirmed proximal placement and direct visualization confirmed a good coil within the bladder.  I drained the bladder and withdrew the scope.  Patient tolerated procedure well and was stable postoperatively.  Plan: Return in 1 week for stent removal

## 2019-06-15 NOTE — Anesthesia Postprocedure Evaluation (Signed)
Anesthesia Post Note  Patient: Carl [name]  Procedure(s) Performed: CYSTOSCOPY RIGHT URETEROSCOPY HOLMIUM LASER STENT PLACEMENT (Right )     Patient location during evaluation: PACU Anesthesia Type: General Level of consciousness: awake and alert Pain management: satisfactory to patient Vital Signs Assessment: post-procedure vital signs reviewed and stable Respiratory status: spontaneous breathing, nonlabored ventilation and respiratory function stable Cardiovascular status: blood pressure returned to baseline and stable Postop Assessment: no apparent nausea or vomiting Anesthetic complications: no    Last Vitals:  Vitals:   06/15/19 1145 06/15/19 1233  BP: (!) 147/65 124/78  Pulse: 73 90  Resp: 12 18  Temp:  37.1 C  SpO2: 98% 97%                 Audry Pili

## 2019-06-15 NOTE — Transfer of Care (Signed)
Immediate Anesthesia Transfer of Care Note  Patient: Carl [name]  Procedure(s) Performed: Procedure(s): CYSTOSCOPY RIGHT URETEROSCOPY HOLMIUM LASER STENT PLACEMENT (Right)  Patient Location: PACU  Anesthesia Type:General  Level of Consciousness:  sedated, patient cooperative and responds to stimulation  Airway & Oxygen Therapy:Patient Spontanous Breathing and Patient connected to face mask oxgen  Post-op Assessment:  Report given to PACU RN and Post -op Vital signs reviewed and stable  Post vital signs:  Reviewed and stable  Last Vitals:  Vitals:   06/15/19 0638  BP: 114/66  Pulse: 89  Resp: 18  Temp: 37.1 C  SpO2: [id number]    Complications: No apparent anesthesia complications

## 2019-06-15 NOTE — H&P (Signed)
CC/HPI: CC: history of prostate cancer, renal calculi  HPI:  09/03/2017:  He has a somewhat complicated history in regards to his prostate cancer. In 1997 he underwent brachytherapy. He had a biochemical recurrence around 2004 at which point he was started on androgen deprivation. At one point he was on intermittent ADT. Sometime while he was on this, his primary care physician gave him testosterone replacement therapy which resulted in a great increase in his PSA. He was subsequently placed back on androgen deprivation therapy and has been on this since. Unfortunately he states that he was sick around the time of his last injection and therefore administered. His last injection was around May and he is supposed to get them every 4 months.   01/22/2018:  PSA was 4.96 on 12/31/2017. He was previously 4.52 on 09/03/2017. This is despite receiving Lupron at the last visit 4 months ago. Testosterone CASTRATE level and was 17.6. Patient has no complaints today. He was recently hospitalized for left distal ureteral calculi. He was placed on medical expulsive therapy. He states that he has passed multiple stones. He has a long-standing history of nephrolithiasis. On review of CT scan from 2 years prior, he had significant stone burden in the left kidney and this looks about unchanged. He was treated with antibiotics for UTI and has no complaints in this regard. He received a four-month Lupron at this time.   7.9.2019: Axumin PET/CT--asymmetric activity in the right posterior aspect could represent local recurrence. There was no evidence of skeletal/distant soft tissue/abdominal nodal metastasis. Focal uptake in the left frontal lobe as well as left CPA angle not well identified. This was favor to represent benign meningiomas.   07/14/2018:  PSA continues to slowly increase despite castrate levels of testosterone. Labs on 06/30/2018 revealed a PSA of 6.3 and castrate testosterone levels. Last metastatic workup was in  July which was negative. He has minimal complaints today. He does have persistent microscOpic hematuria. He does have a large amount of stone burden continued in the left kidney. There are also some nonobstructing right renal calculi. I do not obviously appreciate ureteral calculi.   07/29/18: Has not f/u with Dr. Alen Blew yet. Planning to reschedule his appointment.   08/11/2018  Patient was recently hospitalized with gross hematuria and Pseudomonas UTI. He has recovered somewhat from that. He presents today for cystoscopy given continued gross hematuria. He has not had a repeat CT scan yet.   08/19/2018  As stated above, the patient's PSA increases despite androgen deprivation therapy and castrate levels of testosterone. He has not seen Dr. Alen Blew yet. Cystoscopy last week did not reveal any evidence of malignancy. He did have some bladder calculi. He underwent a CT IVP which revealed multiple nonobstructing left ureteral calculi, unchanged. He also had a large left renal calculus and multiple small nonobstructing calculi in the right kidney. He has not had any further gross hematuria. No dysuria. No fever. He is frail with multiple medical comorbidities.Last Lupron given 07/14/2018 which was a 6 month injection.   09/14/18: Patient with above noted history. He underwent left ureteroscopy on 2/7 for 3 small ureteral calculi. A 6 X 26 Fr. was left in place following the procedure. He returns today for flexible cystoscopy and stent removal. He states that he continues to have some intermittent LLQ discomfort and irritative lower urinary tract symptoms. He has also had some hematuria with some clots, but he denies difficulties voiding at this time. He denies fevers, chills, nausea, or vomiting.  02/08/19: Patient with above-noted history. He is overdue for follow-up. Urine culture from 02/18 visit was noted to be positive for Pseudomonas, which was treated with IV antibiotics. He presents today with complaints  of gross hematuria in increased urinary frequency from his baseline. He states that increased frequency has been ongoing for about the past 2 weeks. He complains of urinary frequency about every 1 hour. He states that he 1st noted gross hematuria yesterday. He denies passing any clots or having any difficulties voiding. He feels that his urinary stream is fair. He denies dysuria or pain associated with urination. He denies any suprapubic pain or unilateral flank pain. He denies fevers or chills. He denies recent weight loss or new bone pain. He did experience a fall in late February which resulted in a fractured hip. He did undergo hip replacement and subsequently skilled nursing facility stay for rehab. He was due for Lupron in May. Most recent PSA was in December with results noted above. He has not followed up with Dr. Alen Blew.   06/01/2019  Patient presents with gross hematuria. He has had this in the past. Typically it is associated with acute cystitis. Urinalysis consistent with infection. 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 PSA continues to rise. He has been losing weight and becoming weaker. He has failed to follow-up with oncology. Last Lupron was back in July.   06/07/2019  Since I last saw the patient, he developed further gross hematuria with retention. Foley catheter was placed in the emergency department. Hemoglobin was stable at 9.3. He had a CT scan of the abdomen and pelvis CTA. This revealed known staghorn left renal calculi. However, he also had obstructing right ureteral calculi. He had 3 ureteral calculi in all. He is having some right lower quadrant pain. He continues to have some gross hematuria.     ALLERGIES: Cipro - pt has A Fib "messes heart up " per daughterr    MEDICATIONS: Tamsulosin Hcl 0.4 mg capsule 1 capsule PO Daily  Acetaminophen 500 mg tablet  Amiodarone Hcl 200 mg tablet  Cefdinir  Eliquis 5 mg tablet  Omeprazole 40 mg capsule,delayed  release  Os-Cal 500-Vit D3  Rosuvastatin Calcium 40 mg tablet     GU PSH: Cysto Bladder Stone >2.5cm - 2017 Cysto Remove Stent FB Sim - 09/14/2018 Cystoscopy - 08/11/2018, 2017 Locm 300-399Mg /Ml Iodine,1Ml - 08/16/2018, 2017 Ureteroscopic laser litho, Left - 09/03/2018       PSH Notes: Heart Surgery, Inguinal Hernia Repair   NON-GU PSH: CABG (coronary artery bypass grafting) Colonoscopy & Polypectomy Hernia Repair     GU PMH: Flank Pain - 09/14/2018 Renal and ureteral calculus (Stable) - 08/19/2018, In late November, he had a small right ureteral stone which may have passed. He had left distal ureteral calculi as well as bilateral renal calculi, left>> right. He is relatively asymptomatic but does have pyuria., - 06/30/2018 Bladder Stone (Stable) - 08/11/2018, - 2017 Prostate Cancer - 08/11/2018, - 07/29/2018, - 07/14/2018 (Worsening), He does have evidence of recurrence of his prostate cancer. He had brachytherapy years ago. He has not received a Lupron injection in 6 months., - 06/30/2018 (Stable), - 01/22/2018, - 2019, Prostate cancer, - 2017 Gross hematuria - 07/29/2018 Microscopic hematuria (Stable) - 07/14/2018, - 2017 Renal calculus (Stable) - 07/14/2018, - 2017 Bacteriuria - 06/30/2018 Rising PSA after prostate cancer treatment - 01/22/2018 Acute Cystitis/UTI - 2019 Chronic cystitis (w/o hematuria), Chronic cystitis - 2016 Weak Urinary Stream, Weak urinary stream - 2016 Dysuria, Dysuria -  2014 History of prostate cancer, Prostate Cancer - 2014 History of urolithiasis, Nephrolithiasis - 2014      PMH Notes:  Past Gu Hx:    [name] returns for follow-up. Again, his situation is fairly complicated. He had been a long-standing patient of Dr. Caesar Bookman and we assumed his care in 2006.[name] He was originally diagnosed with what appeared to be favorable prostate cancer 18 years ago( 1997). He had a seed implantation which subsequently failed. His PSA began to rise and it appears that around  2004 he was started on hormonal therapy. His testosterone fell but not all the way to castrate levels. His PSA went down to a nadir level of 1.1 in October 2006. It was elected to do intermittent hormonal therapy and his PSA slowly increased. His testosterone remained on the low side. Primary care physician was unaware of his diagnosis of prostate cancer and he was put on testosterone. With that, his PSA went up really quite dramatically. It got as high as approximately 12.0. The patient's testosterone supplementation was subsequently discontinued, but his PSA continued to remain elevated. For that reason, we decided to go ahead and put him back on hormonal therapy. A bone scan was done in November in 2007 which was negative. He has been on Lupron therapy now since 09/2006. He still has occasional hot flashes but is doing pretty well. No voiding complaints. No other new systemic complaints. He does like to take usually about 2 pain pills a day to help with some chronic back discomfort. PSA 06/2007 down to 1.6.    Recent PSA in June 2014 was actually down slightly at 3.1. There's been a slow increase over the last couple of years. 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 DEXA scan was repeated in July of 2014. The hip showed some very mild osteopenia and spine was completely normal.     NON-GU PMH: Encounter for general adult medical examination without abnormal findings, Encounter for preventive health examination - 2016 Atrial Fibrillation Cardiac murmur, unspecified Congestive heart failure GERD Hypercholesterolemia Hypertension Orthostatic hypotension Polyp of colon    FAMILY HISTORY: 2 daughters - Daughter 1 son - Runs in Family Death - Mother, Father   SOCIAL HISTORY: Marital Status: Widowed Preferred Language: English; Ethnicity: Not Hispanic Or Latino; Race: White Current Smoking Status:  Patient does not smoke anymore. Has not smoked since 02/25/1966.  Does not drink anymore.  Drinks 1 caffeinated drink per day. Patient's occupation is/was Retired.     Notes: Former smoker   REVIEW OF SYSTEMS:    GU Review Male:   Patient denies frequent urination, hard to postpone urination, burning/ pain with urination, get up at night to urinate, leakage of urine, stream starts and stops, trouble starting your stream, have to strain to urinate , erection problems, and penile pain.  Gastrointestinal (Upper):   Patient denies nausea, vomiting, and indigestion/ heartburn.  Gastrointestinal (Lower):   Patient denies diarrhea and constipation.  Constitutional:   Patient denies fever, night sweats, weight loss, and fatigue.  Skin:   Patient denies skin rash/ lesion and itching.  Eyes:   Patient denies blurred vision and double vision.  Ears/ Nose/ Throat:   Patient denies sore throat and sinus problems.  Hematologic/Lymphatic:   Patient denies swollen glands and easy bruising.  Cardiovascular:   Patient denies leg swelling and chest pains.  Respiratory:   Patient denies cough and shortness of  breath.  Endocrine:   Patient denies excessive thirst.  Musculoskeletal:   Patient denies joint pain and back pain.  Neurological:   Patient denies headaches and dizziness.  Psychologic:   Patient denies depression and anxiety.   Notes: "blood" getting worst" KS pain    VITAL SIGNS:      06/07/2019 01:00 PM  BP 109/66 mmHg  Heart Rate 81 /min  Temperature 97.5 F / 36.3 C   GU PHYSICAL EXAMINATION:    Penis: Circumcised, Foley catheter in place draining red urine   MULTI-SYSTEM PHYSICAL EXAMINATION:    Constitutional: Frail elderly male in no acute distress  Respiratory: No labored breathing, no use of accessory muscles.   Cardiovascular: Normal temperature, adequate perfusion of extremities  Skin: No paleness, no jaundice  Neurologic / Psychiatric: Oriented to time, oriented to place, oriented  to person. No depression, no anxiety, no agitation.  Gastrointestinal: No mass, no tenderness, no rigidity, non obese abdomen.  Eyes: Normal conjunctivae. Normal eyelids.  Musculoskeletal: Normal gait and station of head and neck.     PAST DATA REVIEWED:  Source Of History:  Patient  Lab Test Review:   Hemoglobin and Hematocrit  Records Review:   Previous Doctor Records, Previous Hospital Records, Previous Patient Records  Urine Test Review:   Urine Culture  X-Ray Review: C.T. Abdomen/Pelvis: Reviewed Films. Reviewed Report. Discussed With Patient.     06/01/19 02/09/19 06/30/18 12/31/17 09/03/17 12/10/16 07/25/16 03/14/16  PSA  Total PSA 7.21 ng/mL 7.85 ng/mL 6.30 ng/mL 4.96 ng/mL 4.52 ng/mL 1.59 ng/dl 1.56 ng/dl 1.42     06/01/19 02/09/19 06/30/18 12/31/17 09/03/17 12/10/16 07/25/16 03/14/16  Hormones  Testosterone, Total 15.5 ng/dL 13.2 ng/dL <10 ng/dL 17.6 ng/dL 16.3 ng/dL 25.0 pg/dL 33.0 pg/dL 33.0 pg/dL    PROCEDURES:        Notes:   Pt.'s foley was irrigated with 500cc of sterile water. Clots noted. Irrigated to clear---EW   ASSESSMENT:      ICD-10 Details  1 GU:   Ureteral calculus - A999333   2   Renal colic - Q000111Q   3   Gross hematuria - R31.0    PLAN:           Orders Labs Hemoglobin and Hematocrit(Stat)          Document Letter(s):  Created for Patient: Clinical Summary         Notes:   Plan for cystoscopy, right ureteroscopy laser lithotripsy, ureteral stent placement. Will plan for also possibly performing clot evacuation and fulguration, possible bladder biopsy.   He understands his health status and that he is high risk for anesthesia.   CBC today.        Next Appointment:      Next Appointment: 06/20/2019 10:00 AM    Appointment Type: [facility]    Location: [facility] [phone]    Provider: [facility]     Signed by Link Snuffer, III, M.D. on 06/07/19 at 3:46 PM (EST

## 2019-06-15 NOTE — Progress Notes (Signed)
Pharmacy Antibiotic Note  Carl [name] is a 83 y.o. male admitted on 06/15/2019 with surgical prophylaxis.  Pharmacy has been consulted for meropenem dosing. D/w RN: consult says bacteremia but really is preop prophylaxis.  Plan: Meropenem 1 gm IV x 1 preop Pharmacy to sign off   Temp (24hrs), Avg:98.5 F (36.9 C), Min:98.1 F (36.7 C), Max:98.8 F (37.1 C)  Recent Labs  Lab 06/14/19 1410  WBC 14.1*  CREATININE 1.17    Estimated Creatinine Clearance: 45.8 mL/min (by C-G formula based on SCr of 1.17 mg/dL).    Allergies  Allergen Reactions  . Ciprofloxacin Other (See Comments)    PER HEART DR   Eudelia Bunch, Pharm.D [phone] 06/15/2019 7:28 AM

## 2019-06-15 NOTE — Discharge Instructions (Signed)

## 2019-06-16 ENCOUNTER — Telehealth: Payer: Self-pay

## 2019-06-16 ENCOUNTER — Encounter (HOSPITAL_COMMUNITY): Payer: Self-pay | Admitting: Urology

## 2019-06-16 NOTE — Telephone Encounter (Signed)
Copied from [place] [id number]. Topic: Referral - Status >> Jun 16, 2019  [id number] PM Simone Curia D wrote: [id number] Spoke with patient's daughter Terrence Gravitt per signed DPR. Discussed resources for food stamps and utility assistance. Ambrose Mantle [phone]

## 2019-06-21 ENCOUNTER — Telehealth: Payer: Self-pay | Admitting: Cardiovascular Disease

## 2019-06-21 ENCOUNTER — Telehealth: Payer: Self-pay

## 2019-06-21 NOTE — Telephone Encounter (Signed)
May need to hold eliquis a few more days before restarting Would talk with urology as suggested

## 2019-06-21 NOTE — Telephone Encounter (Signed)
Copied from [place] [id number]. Topic: Referral - Status >> Jun 21, 2019  [id number] PM Simone Curia D wrote: [id number] Spoke with patient's daughter Roark Holik. Patient has received paperwork from DSS and the family is in the process of filling them out. She will call if her father has any further needs. Ambrose Mantle [phone]

## 2019-06-21 NOTE — Telephone Encounter (Signed)
Spoke with patients wife per release form and she reports that after his urological procedure they had him restart his Eliquis and now he is bleeding again. Advised that she should call his urology office for further advice and instructions regarding his medication. She verbalized understanding and states that he has appointment tomorrow. Recommended that she still call to make them aware he is having active bleeding. She verbalized understanding with no further questions at this time.

## 2019-06-21 NOTE — Telephone Encounter (Signed)
Pt c/o medication issue:  1. Name of Medication: Eliquis   2. How are you currently taking this medication (dosage and times per day)? 5 MG  3. Are you having a reaction (difficulty breathing--STAT)? Blood in urine  4. What is your medication issue? Patient daughter calling, states that Urology said it was ok for patient to start taking Eliquis again post procedure but since starting back patient has been bleeding from penis - blood in urine.  Please call [name] to discuss.

## 2019-06-22 NOTE — Telephone Encounter (Signed)
Reviewed with patients daughter provider recommendations and she reports that he has appointment with urology today in [place]. She is going to talk with them and may call me back with their recommendations. She verbalized understanding that he may hold medication a few more days if needed to clear things up. She verbalized understanding with no further questions at this time.

## 2019-07-04 ENCOUNTER — Ambulatory Visit (INDEPENDENT_AMBULATORY_CARE_PROVIDER_SITE_OTHER): Payer: Medicare Other | Admitting: Pharmacist

## 2019-07-04 DIAGNOSIS — I4819 Other persistent atrial fibrillation: Secondary | ICD-10-CM | POA: Diagnosis not present

## 2019-07-04 DIAGNOSIS — Z951 Presence of aortocoronary bypass graft: Secondary | ICD-10-CM

## 2019-07-04 DIAGNOSIS — N183 Chronic kidney disease, stage 3 unspecified: Secondary | ICD-10-CM

## 2019-07-04 DIAGNOSIS — N201 Calculus of ureter: Secondary | ICD-10-CM

## 2019-07-04 NOTE — Patient Instructions (Signed)
Visit Information  Goals Addressed            This Visit's Progress     Patient Stated   . "I'm worried about his health declining (pt-stated)       Current Barriers:  . Financial Barriers: medication barrier resolved  for 2020 - patient APPROVED for Eliquis through 07/28/2019 o [name] and her brother are working on communicating w/ [employer] to try and figure out where things stand w/ the [facility] application; however, they are having a difficult time. [name]'s brother is the patient's POA; he plans on f/u with [employer] soon . Polypharmacy, complicated patient with multiple chronic conditions including atrial fibrillation, CKD, ASCVD (hx CABG), hx prostate cancer, lung nodule -  o [name] notes that she has seen a decline in her father's health over the past year, ever since he fell and broke his hip. Also wonders if he is depressed - notes that he has lost weight d/t not having an appetite ("he's skin and bones"), and doesn't seem to have energy or desire to do much besides sit in his chair and sleep throughout the day. Notes that she has Ensure shakes in the house, but that he doesn't often drink them  o Atrial fibrillation: Eliquis 5 mg BID, amiodarone 200 mg daily - S/p cytoscopy 06/15/2019; patient restarted Eliquis as directed, but experienced some bleeding; urology recommended he hold for longer. Restarted ~1 week ago, no bleeding since o ASCVD risk reduction: rosuvastatin 40 mg daily; LDL at goal <70 o GERD: omeprazole 40 mg daily o Recurrent kidney stones: tamsulosin 0.4 mg daily;   Pharmacist Clinical Goal(s):  [name] Over the next 90 days, patient will work with PharmD and providers for optimized medication management  Interventions: . Comprehensive medication review performed, medication list updated in electronic medical record.  . Discussed referral for palliative care, as patient would benefit from home visits from NP, as well as support from social worker in working with social services.  Will collaborate w/ Dr. [name] about this.    Patient Self Care Activities:  . Patient will take medications as prescribed  Please see past updates related to this goal by clicking on the "Past Updates" button in the selected goal         The patient verbalized understanding of instructions provided today and declined a print copy of patient instruction materials.    Plan: - Scheduled follow up for medication management review in ~6 weeks  [name], PharmD, [place], [place] Pharmacist [address] [phone]

## 2019-07-04 NOTE — Chronic Care Management (AMB) (Signed)
Chronic Care Management   Follow Up Note   07/04/2019 Name: Carl [name] MRN: [id number] DOB: May 20, 1936  Referred by: Carl Haven, MD Reason for referral : Chronic Care Management (Medication Management)   Carl [name] is a 83 y.o. year old male who is a primary care patient of Carl [name], Carl Adam, MD. The CCM team was consulted for assistance with chronic disease management and care coordination needs.    Contacted patient's daughter, Carl [name], for medication management review.   Review of patient status, including review of consultants reports, relevant laboratory and other test results, and collaboration with appropriate care team members and the patient's provider was performed as part of comprehensive patient evaluation and provision of chronic care management services.    SDOH (Social Determinants of Health) screening performed today: Financial Strain  Depression  . See Care Plan for related entries.   Outpatient Encounter Medications as of 07/04/2019  Medication Sig Note  . acetaminophen (TYLENOL) 500 MG tablet Take 1,000 mg by mouth every 6 (six) hours as needed for mild pain (for pain).  07/04/2019: Taking 2 Q4-6H  . amiodarone (PACERONE) 200 MG tablet Take 1 tablet (200 mg total) by mouth daily.   [name] apixaban (ELIQUIS) 5 MG TABS tablet Take 1 tablet (5 mg total) by mouth 2 (two) times daily.   [name] omeprazole (PRILOSEC) 40 MG capsule Take 1 capsule (40 mg total) by mouth daily.   . rosuvastatin (CRESTOR) 40 MG tablet Take 1 tablet (40 mg total) by mouth daily.   . tamsulosin (FLOMAX) 0.4 MG CAPS capsule Take 1 capsule (0.4 mg total) by mouth daily after supper.   . [DISCONTINUED] HYDROcodone-acetaminophen (NORCO/VICODIN) 5-325 MG tablet Take 1 tablet by mouth every 6 (six) hours as needed for moderate pain.    . [DISCONTINUED] HYDROcodone-acetaminophen (NORCO/VICODIN) 5-325 MG tablet Take 1 tablet by mouth every 4 (four) hours as needed for moderate pain.    No  facility-administered encounter medications on file as of 07/04/2019.      Goals Addressed            This Visit's Progress     Patient Stated   . "I'm worried about his health declining (pt-stated)       Current Barriers:  . Financial Barriers: medication barrier resolved  for 2020 - patient APPROVED for Eliquis through 07/28/2019 o [name] and her brother are working on communicating w/ [employer] to try and figure out where things stand w/ the [facility] application; however, they are having a difficult time. [name]'s brother is the patient's POA; he plans on f/u with [employer] soon . Polypharmacy, complicated patient with multiple chronic conditions including atrial fibrillation, CKD, ASCVD (hx CABG), hx prostate cancer, lung nodule -  o Carl [name] notes that she has seen a decline in her father's health over the past year, ever since he fell and broke his hip. Also wonders if he is depressed - notes that he has lost weight d/t not having an appetite ("he's skin and bones"), and doesn't seem to have energy or desire to do much besides sit in his chair and sleep throughout the day. Notes that she has Ensure shakes in the house, but that he doesn't often drink them  o Atrial fibrillation: Eliquis 5 mg BID, amiodarone 200 mg daily - S/p cytoscopy 06/15/2019; patient restarted Eliquis as directed, but experienced some bleeding; urology recommended he hold for longer. Restarted ~1 week ago, no bleeding since o ASCVD risk reduction: rosuvastatin 40 mg daily; LDL at  goal <70 o GERD: omeprazole 40 mg daily o Recurrent kidney stones: tamsulosin 0.4 mg daily;   Pharmacist Clinical Goal(s):  [name] Over the next 90 days, patient will work with PharmD and providers for optimized medication management  Interventions: . Comprehensive medication review performed, medication list updated in electronic medical record.  . Discussed referral for Palliative Care, as patient would benefit from home visits from NP to address goals of  care and symptomatic management, s well as support from social worker in working with social services. Will collaborate w/ Dr. Caryl [name] about this.    Patient Self Care Activities:  . Patient will take medications as prescribed  Please see past updates related to this goal by clicking on the "Past Updates" button in the selected goal          Plan: - Scheduled follow up for medication management review in ~6 weeks  [name], PharmD, [place], [place] Pharmacist [address] [phone]

## 2019-07-12 NOTE — Progress Notes (Signed)
Palliative referral placed

## 2019-07-12 NOTE — Addendum Note (Signed)
Addended by: [name] on: 07/12/2019 04:58 PM   Modules accepted: Orders

## 2019-07-13 ENCOUNTER — Telehealth: Payer: Self-pay | Admitting: Nurse Practitioner

## 2019-07-15 NOTE — Telephone Encounter (Signed)
Called patient's daughter [name] to schedule Palliative Consult, no answer - left message with my contact information

## 2019-07-20 ENCOUNTER — Telehealth: Payer: Self-pay | Admitting: Nurse Practitioner

## 2019-07-20 NOTE — Telephone Encounter (Signed)
Rec'd call back from daughter [name], and we discussed Palliative services and she was in agreement with this.  I have scheduled a Telephone Consult for 08/11/19 @ 3:30 PM

## 2019-07-20 NOTE — Telephone Encounter (Signed)
Called daughter [name] to schedule Palliative Consult, no answer - left message with reason for call along with my contact information.  I also called patient's granddaughter-in-law Maree Erie (listed in [facility] system as person who handles appointments for patient), when I spoke with her she stated that she no longer does this.  She took my name and number and is going to contact the daughter to let her know that I have been trying to reach her to schedule an appointment.  She stated that if I did not hear back from her that I could call her back next week.

## 2019-08-03 ENCOUNTER — Other Ambulatory Visit: Payer: Self-pay | Admitting: Physician Assistant

## 2019-08-04 ENCOUNTER — Other Ambulatory Visit: Payer: Self-pay | Admitting: Family Medicine

## 2019-08-04 DIAGNOSIS — R109 Unspecified abdominal pain: Secondary | ICD-10-CM

## 2019-08-08 ENCOUNTER — Telehealth: Payer: Medicare Other

## 2019-08-08 ENCOUNTER — Ambulatory Visit: Payer: Self-pay | Admitting: Pharmacist

## 2019-08-08 NOTE — Chronic Care Management (AMB) (Signed)
  Chronic Care Management   Note  08/08/2019 Name: Carl [name] MRN: [id number] DOB: 10/29/1935  Carl [name] is a 84 y.o. year old male who is a primary care patient of [name], [name], MD. The CCM team was consulted for assistance with chronic disease management and care coordination needs.    Attempted to contact patient's daughter, [name], to follow up on medication management needs. Left HIPAA compliant message for her to return my call at her convenience.   Follow up plan:  Will collaborate w/ Care Guide to schedule f/u with patient  [name], PharmD, [place], CPP Clinical Pharmacist [place] [phone]

## 2019-08-10 ENCOUNTER — Telehealth: Payer: Self-pay | Admitting: Family Medicine

## 2019-08-10 NOTE — Chronic Care Management (AMB) (Signed)
  Care Management   Note  08/10/2019 Name: LAVONTAY MISH MRN: [id number] DOB: July 24, 1936  [name] is a 84 y.o. year old male who is a primary care patient of [name], MD and is actively engaged with the care management team. I reached out to [name] by phone today to assist with re-scheduling a follow up appointment with the Pharmacist  Follow up plan: Telephone appointment with care management team member scheduled for: 09/12/2019  [place], [place] Management  [place], [place] [address] Direct Dial: [email or web address]  Website: [email or web address]

## 2019-08-11 ENCOUNTER — Other Ambulatory Visit: Payer: Medicare Other | Admitting: Nurse Practitioner

## 2019-08-11 ENCOUNTER — Other Ambulatory Visit: Payer: Self-pay

## 2019-08-11 ENCOUNTER — Ambulatory Visit (INDEPENDENT_AMBULATORY_CARE_PROVIDER_SITE_OTHER): Payer: Medicare Other | Admitting: Pharmacist

## 2019-08-11 ENCOUNTER — Telehealth: Payer: Self-pay

## 2019-08-11 DIAGNOSIS — I4819 Other persistent atrial fibrillation: Secondary | ICD-10-CM | POA: Diagnosis not present

## 2019-08-11 NOTE — Patient Instructions (Signed)
Visit Information  Goals Addressed            This Visit's Progress     Patient Stated   . "I'm worried about his health declining (pt-stated)       Current Barriers:  . Financial Barriers: Eliquis assistance ran out 07/27/19. [name] calls me today because they are running low on the supply from [place] for [employer] Extra Help 03/31/19. [name] notes that she is unsure if they have received any sort of notice on this. In November, she told me they had received a packet from [place], but she was unsure what it was about or where she put it.  o Notes they had to cancel the Palliative Care visit today because she has an appointment this afternoon. . Polypharmacy, complicated patient with multiple chronic conditions including atrial fibrillation, CKD, ASCVD (hx CABG), hx prostate cancer, lung nodule - o Atrial fibrillation: Eliquis 5 mg BID, amiodarone 200 mg daily o ASCVD risk reduction: rosuvastatin 40 mg daily; LDL at goal <70 o GERD: omeprazole 40 mg daily o Recurrent kidney stones: tamsulosin 0.4 mg daily;   Pharmacist Clinical Goal(s):  [name] Over the next 90 days, patient will work with PharmD and providers for optimized medication management  Interventions: . Reviewed that Eliquis assistance program was only through 07/28/2019, and that he needs to spend 3% of his total household income on copays before he will qualify again.  [name] Contacted [facility], asked them to run the prescription for Eliquis. Copay is $47, so clearly patient has not been approved for Medicare Extra Help.  . Will collaborate w/ Care Guide to outreach [name] to see if she can provide any assistance in communicating w/ SSA or re-applying for Extra Help. If patient is able to connect w/ Palliative Care, will also ask Social Work to support with this . Messaged cardiology RN to see if they could try completing a Tier Exception on Eliquis.  Patient Self Care Activities:  . Patient will take  medications as prescribed  Please see past updates related to this goal by clicking on the "Past Updates" button in the selected goal         The patient verbalized understanding of instructions provided today and declined a print copy of patient instruction materials.    Plan: - Will collaborate w/ interdisciplinary team as above - Will outreach patient as previously scheduled  [name], PharmD, [place], [place] Pharmacist [place] [phone]

## 2019-08-11 NOTE — Telephone Encounter (Signed)
Message received from patient's daughter to cancel today's phone visit. Message sent to [place] NP

## 2019-08-11 NOTE — Chronic Care Management (AMB) (Signed)
Chronic Care Management   Follow Up Note   08/11/2019 Name: Carl [name] MRN: [id number] DOB: 1936/01/22  Referred by: [name], MD Reason for referral : Chronic Care Management (Medication Management)   Carl [name] is a 84 y.o. year old male who is a primary care patient of [name], [name], MD. The CCM team was consulted for assistance with chronic disease management and care coordination needs.    Received call from [place] today asking about reapplying for Eliquis assistance.   Review of patient status, including review of consultants reports, relevant laboratory and other test results, and collaboration with appropriate care team members and the patient's provider was performed as part of comprehensive patient evaluation and provision of chronic care management services.    SDOH (Social Determinants of Health) screening performed today: Financial Strain . See Care Plan for related entries.   Outpatient Encounter Medications as of 08/11/2019  Medication Sig Note  . acetaminophen (TYLENOL) 500 MG tablet Take 1,000 mg by mouth every 6 (six) hours as needed for mild pain (for pain).  07/04/2019: Taking 2 Q4-6H  . amiodarone (PACERONE) 200 MG tablet TAKE 1 TABLET BY MOUTH EVERY DAY   . apixaban (ELIQUIS) 5 MG TABS tablet Take 1 tablet (5 mg total) by mouth 2 (two) times daily.   [name] omeprazole (PRILOSEC) 40 MG capsule TAKE 1 CAPSULE BY MOUTH EVERY DAY   . rosuvastatin (CRESTOR) 40 MG tablet Take 1 tablet (40 mg total) by mouth daily.   . tamsulosin (FLOMAX) 0.4 MG CAPS capsule Take 1 capsule (0.4 mg total) by mouth daily after supper.    No facility-administered encounter medications on file as of 08/11/2019.     Goals Addressed            This Visit's Progress     Patient Stated   . "I'm worried about his health declining (pt-stated)       Current Barriers:  . Financial Barriers: Eliquis assistance ran out 07/27/19. [name] calls me today because they are  running low on the supply from [place] for [employer] Extra Help 03/31/19. [name] notes that she is unsure if they have received any sort of notice on this. In November, she told me they had received a packet from [place], but she was unsure what it was about or where she put it.  o Notes they had to cancel the Palliative Care visit today because she has an appointment this afternoon. . Polypharmacy, complicated patient with multiple chronic conditions including atrial fibrillation, CKD, ASCVD (hx CABG), hx prostate cancer, lung nodule - o Atrial fibrillation: Eliquis 5 mg BID, amiodarone 200 mg daily o ASCVD risk reduction: rosuvastatin 40 mg daily; LDL at goal <70 o GERD: omeprazole 40 mg daily o Recurrent kidney stones: tamsulosin 0.4 mg daily;   Pharmacist Clinical Goal(s):  [name] Over the next 90 days, patient will work with PharmD and providers for optimized medication management  Interventions: . Reviewed that Eliquis assistance program was only through 07/28/2019, and that he needs to spend 3% of his total household income on copays before he will qualify again.  [name], asked [facility] the prescription for Eliquis. Copay is $47, so clearly patient has not been approved for Medicare Extra Help.  . Will collaborate w/ Care Guide to outreach [name] to see if she can provide any assistance in communicating w/ SSA or re-applying for Extra Help. If patient is able to connect w/ Palliative  Care, will also ask Social Work to support with this . Messaged cardiology RN to see if they could try completing a Tier Exception on Eliquis.  Patient Self Care Activities:  . Patient will take medications as prescribed  Please see past updates related to this goal by clicking on the "Past Updates" button in the selected goal          Plan: - Will collaborate w/ interdisciplinary team as above - Will outreach patient as previously scheduled  [name], PharmD,  [place], [place] Pharmacist [place] [phone]

## 2019-08-12 ENCOUNTER — Telehealth: Payer: Self-pay

## 2019-08-12 ENCOUNTER — Telehealth: Payer: Self-pay | Admitting: Nurse Practitioner

## 2019-08-12 NOTE — Telephone Encounter (Signed)
08/12/2019 Left message on voicemail for patient's grand-daughter Carl [name] (per signed DPR) to return my regarding Medicare Extrahelp for Carl [name]. Unable to leave message on patient's home or cell number due to voicemail not being set-up.  Ambrose Mantle [phone]

## 2019-08-12 NOTE — Telephone Encounter (Signed)
Rec'd message yesterday that daughter needed to cancel patient's appointment for Palliative, I called her back to reschedule and it went straight to voicemail.  Left message with my call back number.

## 2019-08-12 NOTE — Telephone Encounter (Signed)
08/11/2018 Entered in error. Ambrose Mantle [phone]

## 2019-08-18 ENCOUNTER — Telehealth: Payer: Self-pay | Admitting: Nurse Practitioner

## 2019-08-18 NOTE — Telephone Encounter (Signed)
Called daughter [name] to reschedule the 1/14 Palliative Consult (due to cancellation), went straight to voicemail - left message with reason for call along with my contact information.

## 2019-08-19 ENCOUNTER — Other Ambulatory Visit: Payer: Self-pay

## 2019-08-19 DIAGNOSIS — I25810 Atherosclerosis of coronary artery bypass graft(s) without angina pectoris: Secondary | ICD-10-CM

## 2019-08-19 MED ORDER — ROSUVASTATIN CALCIUM 40 MG PO TABS: 40.0000 mg | ORAL_TABLET | Freq: Every day | ORAL | 3 refills | Status: DC

## 2019-08-19 NOTE — Telephone Encounter (Signed)
[facility]  Carl [name],cma

## 2019-08-22 ENCOUNTER — Telehealth: Payer: Self-pay

## 2019-08-22 ENCOUNTER — Telehealth: Payer: Self-pay | Admitting: Nurse Practitioner

## 2019-08-22 NOTE — Telephone Encounter (Signed)
Spoke with daughter [name] and asked her if we could reschedule the Palliative Consult, she said she would call me back whenever she got back home.

## 2019-08-22 NOTE — Telephone Encounter (Signed)
-----   Message from De Hollingshead, [facility] sent at 08/12/2019  2:25 PM EST ----- Regarding: RE: Tier Exception for Eliquis? That would be HUGELY wonderful if you don't mind trying. Just to make sure we've done everything we can to try to remove this barrier.   I appreciate it! Let me know what you need from me.   Catie ----- Message ----- From: Alroy Dust.Daleen Bo, RN Sent: 08/12/2019   1:08 PM EST To: De Hollingshead, [facility] Subject: RE: Tier Exception for Eliquis?                Hey there,   I do still work with [employer].  I have not heard anything about tier exception for covid reasons but I can try. Do you want me to go ahead and try?  Lorain Keast ----- Message ----- From: De Hollingshead, [facility] Sent: 08/11/2019  11:54 AM EST To: Alroy Dust.Emalene Welte, RN Subject: Tier Exception for Eliquis?                    Hey Daleen Bo,   Do you still work with Dr. Sharolyn Douglas?   This patient is having a hard time affording the $47 copay for Eliquis. We had gotten patient assistance through 07/28/19, but to reapply, he has to spend that 3% of his income on copays.   I'm working on other avenues like applying for Medicare Extra Help, but we did that back in September and the patient never heard anything from [place].   Reason why I was reaching out to you - I had another patient who independently requested a Tier Exception on Eliquis, and it was approved due to the pandemic (something about reducing frequency of exposure due to not needing frequent INR checks). I wondered if you had seen that occur with any other patients, and if you all were willing to try that for this patient. I know they are just going to stop taking the Eliquis, or take it once daily, like they historically have done when they can't afford it.   Thanks!  [name], PharmD, [place], CPP Clinical Pharmacist [place] [phone]

## 2019-08-22 NOTE — Telephone Encounter (Signed)
Paper work started. Will have Ignacia Bayley, NP take a look when available.

## 2019-08-25 ENCOUNTER — Encounter: Payer: Self-pay | Admitting: Nurse Practitioner

## 2019-08-25 ENCOUNTER — Other Ambulatory Visit: Payer: Self-pay | Admitting: Urology

## 2019-08-25 DIAGNOSIS — M8580 Other specified disorders of bone density and structure, unspecified site: Secondary | ICD-10-CM

## 2019-08-26 ENCOUNTER — Other Ambulatory Visit: Payer: Self-pay | Admitting: Cardiovascular Disease

## 2019-08-26 NOTE — Telephone Encounter (Signed)
-----   Message from De Hollingshead, [facility] sent at 08/12/2019  2:25 PM EST ----- Regarding: RE: Tier Exception for Eliquis? That would be HUGELY wonderful if you don't mind trying. Just to make sure we've done everything we can to try to remove this barrier.   I appreciate it! Let me know what you need from me.   Catie ----- Message ----- From: Alroy Dust.Daleen Bo, RN Sent: 08/12/2019   1:08 PM EST To: De Hollingshead, [facility] Subject: RE: Tier Exception for Eliquis?                Hey there,   I do still work with [employer].  I have not heard anything about tier exception for covid reasons but I can try. Do you want me to go ahead and try?  Kinslee Dalpe ----- Message ----- From: De Hollingshead, [facility] Sent: 08/11/2019  11:54 AM EST To: Alroy Dust.Jaki Hammerschmidt, RN Subject: Tier Exception for Eliquis?                    Hey Daleen Bo,   Do you still work with Dr. Sharolyn Douglas?   This patient is having a hard time affording the $47 copay for Eliquis. We had gotten patient assistance through 07/28/19, but to reapply, he has to spend that 3% of his income on copays.   I'm working on other avenues like applying for Medicare Extra Help, but we did that back in September and the patient never heard anything from [place].   Reason why I was reaching out to you - I had another patient who independently requested a Tier Exception on Eliquis, and it was approved due to the pandemic (something about reducing frequency of exposure due to not needing frequent INR checks). I wondered if you had seen that occur with any other patients, and if you all were willing to try that for this patient. I know they are just going to stop taking the Eliquis, or take it once daily, like they historically have done when they can't afford it.   Thanks!  [name], PharmD, [place], CPP Clinical Pharmacist [place] [phone]

## 2019-08-26 NOTE — Telephone Encounter (Signed)
Tier exception form faxed to medicare 08/26/19 @12 :21A

## 2019-08-29 ENCOUNTER — Telehealth: Payer: Self-pay | Admitting: Nurse Practitioner

## 2019-08-29 NOTE — Telephone Encounter (Signed)
Spoke with daughter to see if she wanted to reschedule the Telephone Palliative Consult and she was in agreement with this.  I have rescheduled it for 09/15/19 @ 2 PM.

## 2019-08-30 ENCOUNTER — Telehealth: Payer: Self-pay

## 2019-08-30 ENCOUNTER — Ambulatory Visit (INDEPENDENT_AMBULATORY_CARE_PROVIDER_SITE_OTHER): Payer: Medicare Other | Admitting: Family Medicine

## 2019-08-30 ENCOUNTER — Telehealth: Payer: Self-pay | Admitting: Family Medicine

## 2019-08-30 ENCOUNTER — Other Ambulatory Visit: Payer: Self-pay

## 2019-08-30 ENCOUNTER — Encounter: Payer: Self-pay | Admitting: Family Medicine

## 2019-08-30 VITALS — Ht 70.0 in | Wt 140.0 lb

## 2019-08-30 DIAGNOSIS — R634 Abnormal weight loss: Secondary | ICD-10-CM | POA: Diagnosis not present

## 2019-08-30 DIAGNOSIS — I4819 Other persistent atrial fibrillation: Secondary | ICD-10-CM

## 2019-08-30 DIAGNOSIS — K635 Polyp of colon: Secondary | ICD-10-CM

## 2019-08-30 DIAGNOSIS — B351 Tinea unguium: Secondary | ICD-10-CM

## 2019-08-30 DIAGNOSIS — Z8546 Personal history of malignant neoplasm of prostate: Secondary | ICD-10-CM

## 2019-08-30 DIAGNOSIS — J309 Allergic rhinitis, unspecified: Secondary | ICD-10-CM

## 2019-08-30 MED ORDER — LORATADINE 10 MG PO TABS: 10.0000 mg | ORAL_TABLET | Freq: Every day | ORAL | 11 refills | Status: DC

## 2019-08-30 NOTE — Telephone Encounter (Signed)
08/30/2019 Spoke with patient's daughter Argyle Shamrock per signed DPR about status of Medicare Extrahelp application.  [name] stated she spoke with Buckner Malta, plan finder counselor with [facility] and he processed application over the phone.  The application process takes 4-6 weeks and the patient/[name] will receive a response via mail, reminded [name] of the  timetable.  I also gave [name] the local number to call the [facility] coordinator at [employer]. [name] stated there was nothing else the patient needed assistance with.  Ambrose Mantle [phone]

## 2019-08-30 NOTE — Progress Notes (Signed)
Virtual Visit via telephone Note  This visit type was conducted due to national recommendations for restrictions regarding the COVID-19 pandemic (e.g. social distancing).  This format is felt to be most appropriate for this patient at this time.  All issues noted in this document were discussed and addressed.  No physical exam was performed (except for noted visual exam findings with Video Visits).   I connected with Carl [name] today at 11:00 AM EST by telephone and verified that I am speaking with the correct person using two identifiers. Location patient: home Location provider: home office Persons participating in the virtual visit: patient, provider, Carl [name] (daughter)  I discussed the limitations, risks, security and privacy concerns of performing an evaluation and management service by telephone and the availability of in person appointments. I also discussed with the patient that there may be a patient responsible charge related to this service. The patient expressed understanding and agreed to proceed.  Interactive audio and video telecommunications were attempted between this provider and patient, however failed, due to patient having technical difficulties OR patient did not have access to video capability.  We continued and completed visit with audio only.   Reason for visit: f/u  HPI: A. fib: Taking amiodarone and Eliquis.  No palpitations or bleeding issues.  Hyperlipidemia: Taking Crestor.  No chest pain, shortness of breath, myalgias, or right upper quadrant pain.  Allergic rhinitis: Patient notes rhinorrhea with sneezing and watery eyes.  Benadryl does help with this.  Onychomycosis: Patient notes his toenails are hard to clip.  There is occasional pain.  He does not see podiatry.  Weight loss: Patient notes his weight seems to go up and down though has trended down over the last 6 months or so.  He is down about 16 pounds.  He notes he is eating fairly well  with scrambled eggs, bacon, and gravy as well as meatloaf and mashed potatoes and gravy.  He does note some mild depression at times some days out of the week when he worries about his daughter though nothing significant.  No SI.  No anxiety.  No night sweats.  He notes no widespread itching.  No abdominal pain, nausea, or vomiting.  Occasional diarrhea though nothing persistent.  No blood in his stool.  No melena.  No hematuria.  He does have a history of prostate cancer and reports he is getting an injection through urology for this.  Reports he saw urology sometime in the last several weeks.   ROS: See pertinent positives and negatives per HPI.  Past Medical History:  Diagnosis Date  . (HFpEF) heart failure with preserved ejection fraction ([place])    a. 08/2016 Echo: EF 50-55%.  [name] AAA (abdominal aortic aneurysm) ([place]) 06/05/2019   abdominal aortic aneurysm measuring 3.1 cm.  . Arthritis    arms  . Bladder calculus   . Chronic anemia   . Chronic anxiety   . CKD (chronic kidney disease), stage III   . Coronary artery disease CARDIOLOGIST-  DR Rockey Situ   a. CABG 2006 with  LIMA-LAD, SVG-Ramus, SVG-OM, seq SVG-PLV-PDA.  . DOE (dyspnea on exertion)   . GERD (gastroesophageal reflux disease)   . Heart murmur   . Hematuria   . Hip fracture ([place]) 10/05/2018  . History of bladder stone   . History of colon polyps   . History of non-ST elevation myocardial infarction (NSTEMI) 03/30/2005   post op recovery inguinal hernia repair  . History of pressure ulcer   .  History of recurrent UTIs    last admission 08-04-2018 pseudomonas UTI  . Hyperlipidemia   . Hyperplasia of prostate without lower urinary tract symptoms (LUTS)   . Hypertension   . Indwelling Foley catheter present   . Intracranial hematoma ([place]) 10/05/2018  . Ischemic cardiomyopathy    a. echo (01/13) 45-50% septal akinesis, mild MR; b. Echo  (01/16) ef 60-65%, mid-apical anteroseptal hypokinesis; c. echo 09-16-2016 ef 50-55%  . Left  ureteral stone   . Lung nodule    noted 08/ 2018;  followed by pcp  . LVH (left ventricular hypertrophy) 09/16/2016   Moderate noted on ECHO  . Moderate mitral regurgitation    a.08/2016 Echo: Mod MR/TR.  [name] Orthostatic hypotension   . PAC (premature atrial contraction)   . Paroxysmal atrial flutter ([place])    a. dx 08/2016, started on amiodarone.  . Persistent atrial fibrillation [facility]) followed by cardiologist-- dr Rockey Situ   a. dx AFib 08/2016 s/p DCCV 10/2016-->eliquis (CHA2DS2VASc = 5).  . Pneumonia   . Pulmonary hypertension ([place])    last echo in epic 09-16-2016  . Recurrent prostate cancer ([place]) UROLOGIST-- DR BELL   s/p  radioactive prostate seed implants in 1997;    due to PSA rising pt has been doing lupron injection's few past several yrs at dr bell office  . Renal calculi    bilateral per CT 06-18-2018  . S/P CABG x 5 04/04/2005   LIMA to LAD,  SVG to Ramus,  SVG to OM,  seqSVG to PLV and PDA  . Wears glasses     Past Surgical History:  Procedure Laterality Date  . CARDIAC CATHETERIZATION  04/02/2005   dr Doreatha Lew    Critical three-vessel coronary disease --  Essentially normal left ventricular function , ef 55%  . CARDIOVASCULAR STRESS TEST  05-01-2008  dr Doreatha Lew /  dr harding   Low risk nuclear study w/ no ischemia or scar but flattened septal motion/  ef 64%  . CARDIOVERSION N/A 11/19/2016   Procedure: CARDIOVERSION;  Surgeon: Jerline Pain, MD;  Location: [facility];  Service: Cardiovascular;  Laterality: N/A;  . COLONOSCOPY N/A 10/23/2016   Procedure: COLONOSCOPY;  Surgeon: Milus Banister, MD;  Location: [facility];  Service: Endoscopy;  Laterality: N/A;  . CORONARY ARTERY BYPASS GRAFT  04/04/2005   dr  Ricard Dillon   LIMA - LAD,  SVG - Ramus, SVG - OM,  seqSVG - PLV and PDA  . CYSTOSCOPY WITH LITHOLAPAXY N/A 05/07/2016   Procedure: CYSTOSCOPY WITH LITHOLAPAXY;  Surgeon: Rana Snare, MD;  Location: [facility];  Service: Urology;  Laterality: N/A;  .  CYSTOSCOPY WITH RETROGRADE PYELOGRAM, URETEROSCOPY AND STENT PLACEMENT  09/03/2018   Procedure: CYSTOSCOPY WITH RETROGRADE PYELOGRAM, URETEROSCOPY LASER LITHOTRIPSY AND STENT PLACEMENT;  Surgeon: Lucas Mallow, MD;  Location: [facility];  Service: Urology;;  . CYSTOSCOPY/URETEROSCOPY/HOLMIUM LASER/STENT PLACEMENT Right 06/15/2019   Procedure: CYSTOSCOPY RIGHT URETEROSCOPY HOLMIUM LASER STENT PLACEMENT;  Surgeon: Lucas Mallow, MD;  Location: [facility];  Service: Urology;  Laterality: Right;  . HOLMIUM LASER APPLICATION N/A [id number]   Procedure: HOLMIUM LASER APPLICATION;  Surgeon: Rana Snare, MD;  Location: [facility];  Service: Urology;  Laterality: N/A;  . INGUINAL HERNIA REPAIR Left 03-30-2005  dr Excell Seltzer   incarcerated   . RADIOACTIVE PROSTATE SEED IMPLANTS  1997  . TOTAL HIP ARTHROPLASTY Right 10/07/2018   Procedure: TOTAL HIP ARTHROPLASTY ANTERIOR APPROACH;  Surgeon: Rod Can, MD;  Location: [facility];  Service: Orthopedics;  Laterality: Right;  . TRANSTHORACIC ECHOCARDIOGRAM  08/24/2014   mid anteroseptal and distal septak hypokinetic,  mild focal basal LVH, ef 60-65%,  grade 1 diastolic dysfunction/  mild MR/  mild LAE/  mild dilated RV with normal RVSP/  trivial TR    Family History  Problem Relation Age of Onset  . Heart disease Mother   . Cancer Mother   . Cancer Father   . Cancer Brother     SOCIAL HX: Former smoker   Current Outpatient Medications:  .  acetaminophen (TYLENOL) 500 MG tablet, Take 1,000 mg by mouth every 6 (six) hours as needed for mild pain (for pain). , Disp: , Rfl:  .  amiodarone (PACERONE) 200 MG tablet, TAKE 1 TABLET BY MOUTH EVERY DAY, Disp: 30 tablet, Rfl: 0 .  apixaban (ELIQUIS) 5 MG TABS tablet, Take 1 tablet (5 mg total) by mouth 2 (two) times daily., Disp: 60 tablet, Rfl: 6 .  omeprazole (PRILOSEC) 40 MG capsule, TAKE 1 CAPSULE BY MOUTH EVERY DAY, Disp: 90 capsule, Rfl: 1 .  rosuvastatin (CRESTOR) 40 MG tablet, Take 1  tablet (40 mg total) by mouth daily., Disp: 90 tablet, Rfl: 3 .  tamsulosin (FLOMAX) 0.4 MG CAPS capsule, Take 1 capsule (0.4 mg total) by mouth daily after supper., Disp: 30 capsule, Rfl: 0 .  loratadine (CLARITIN) 10 MG tablet, Take 1 tablet (10 mg total) by mouth daily., Disp: 30 tablet, Rfl: 11  EXAM: This was a telehealth telephone visit and thus no physical exam was completed.  ASSESSMENT AND PLAN:  Discussed the following assessment and plan:  Weight loss Gradual decline in weight loss over the last 6 months.  Potentially could be related to his prostate cancer history and we will request records from his urologist.  It does appear that he is due for colonoscopy and this was noted after our visit was completed.  I will have the [place] reach out to them and then place a referral.  We will obtain other labs as outlined below to evaluate for other causes.  He does note some mild depression though does not want any medication currently.  He will monitor that.  He is meeting with palliative care in the next couple of weeks and I encouraged him to keep that appointment.  History of prostate cancer Requesting records from urology.  Persistent atrial fibrillation Doing well.  He will continue his current regimen.  Amiodarone is managed through cardiology.  Allergic rhinitis Trial Claritin.  Onychomycosis Refer to podiatry.   Orders Placed This Encounter  Procedures  . Comp Met (CMET)    Standing Status:   Future    Standing Expiration Date:   08/29/2020  . CBC with Differential/Platelet    Standing Status:   Future    Standing Expiration Date:   08/29/2020  . TSH    Standing Status:   Future    Standing Expiration Date:   08/29/2020  . Sedimentation rate    Standing Status:   Future    Standing Expiration Date:   08/29/2020  . Ambulatory referral to Podiatry    Referral Priority:   Routine    Referral Type:   Consultation    Referral Reason:   Specialty Services Required    Requested  Specialty:   Podiatry    Number of Visits Requested:   1    Meds ordered this encounter  Medications  . loratadine (CLARITIN) 10 MG tablet    Sig: Take 1 tablet (  10 mg total) by mouth daily.    Dispense:  30 tablet    Refill:  11     I discussed the assessment and treatment plan with the patient. The patient was provided an opportunity to ask questions and all were answered. The patient agreed with the plan and demonstrated an understanding of the instructions.   The patient was advised to call back or seek an in-person evaluation if the symptoms worsen or if the condition fails to improve as anticipated.  I provided 15 minutes of non-face-to-face time during this encounter.   [name], MD

## 2019-08-30 NOTE — Assessment & Plan Note (Signed)
Gradual decline in weight loss over the last 6 months.  Potentially could be related to his prostate cancer history and we will request records from his urologist.  It does appear that he is due for colonoscopy and this was noted after our visit was completed.  I will have the [place] reach out to them and then place a referral.  We will obtain other labs as outlined below to evaluate for other causes.  He does note some mild depression though does not want any medication currently.  He will monitor that.  He is meeting with palliative care in the next couple of weeks and I encouraged him to keep that appointment.

## 2019-08-30 NOTE — Telephone Encounter (Signed)
I called and spoke with the patient and his son and he stated that yes he will do it you can put in the referral.  Jorah Hua,cma

## 2019-08-30 NOTE — Telephone Encounter (Signed)
Please contact the patient.  I noted after his visit that he is due for a colonoscopy.  It does not appear that he ever had a follow-up on his colonoscopy from 2018.  This needs to be brought up-to-date particularly given his weight loss.  Once you speak with him I can place a referral.

## 2019-08-30 NOTE — Assessment & Plan Note (Signed)
Requesting records from urology. 

## 2019-08-30 NOTE — Assessment & Plan Note (Signed)
Trial Claritin.

## 2019-08-30 NOTE — Assessment & Plan Note (Signed)
Refer to podiatry

## 2019-08-30 NOTE — Telephone Encounter (Signed)
Referral placed  Thanks!

## 2019-08-30 NOTE — Assessment & Plan Note (Signed)
Doing well.  He will continue his current regimen.  Amiodarone is managed through cardiology.

## 2019-09-05 ENCOUNTER — Ambulatory Visit: Payer: Medicare Other | Admitting: Podiatry

## 2019-09-05 NOTE — Progress Notes (Signed)
I called [facility] and spoke to someone in medical records and she is sending the PSA and the most recent note today.  Tynisa Vohs,cma

## 2019-09-07 NOTE — Progress Notes (Signed)
Yes they are in the lab basket.  Craven Crean,cma

## 2019-09-07 NOTE — Progress Notes (Signed)
Urology notes reviewed.  It appears his burden of disease has been fairly low.  They report recent CT imaging late last year with no gross evidence of disease.  Weight loss could be related to some other cause.  We will plan on labs as discussed during his visit.

## 2019-09-08 ENCOUNTER — Other Ambulatory Visit (INDEPENDENT_AMBULATORY_CARE_PROVIDER_SITE_OTHER): Payer: Medicare Other

## 2019-09-08 ENCOUNTER — Other Ambulatory Visit: Payer: Self-pay

## 2019-09-08 DIAGNOSIS — R634 Abnormal weight loss: Secondary | ICD-10-CM

## 2019-09-08 LAB — COMPREHENSIVE METABOLIC PANEL
ALT: 16 U/L (ref 0–53)
AST: 17 U/L (ref 0–37)
Albumin: 3.6 g/dL (ref 3.5–5.2)
Alkaline Phosphatase: 67 U/L (ref 39–117)
BUN: 20 mg/dL (ref 6–23)
CO2: 27 mEq/L (ref 19–32)
Calcium: 8.7 mg/dL (ref 8.4–10.5)
Chloride: 108 mEq/L (ref 96–112)
Creatinine, Ser: 1.22 mg/dL (ref 0.40–1.50)
GFR: 56.89 mL/min — ABNORMAL LOW (ref >60.00)
Glucose, Bld: 113 mg/dL — ABNORMAL HIGH (ref 70–99)
Potassium: 4.4 mEq/L (ref 3.5–5.1)
Sodium: 140 mEq/L (ref 135–145)
Total Bilirubin: 0.3 mg/dL (ref 0.2–1.2)
Total Protein: 6 g/dL (ref 6.0–8.3)

## 2019-09-08 LAB — CBC WITH DIFFERENTIAL/PLATELET
Basophils Absolute: 0 10*3/uL (ref 0.0–0.1)
Basophils Relative: 0.5 % (ref 0.0–3.0)
Eosinophils Absolute: 0 10*3/uL (ref 0.0–0.7)
Eosinophils Relative: 0.3 % (ref 0.0–5.0)
HCT: 34.2 % — ABNORMAL LOW (ref 39.0–52.0)
Hemoglobin: 11 g/dL — ABNORMAL LOW (ref 13.0–17.0)
Lymphocytes Relative: 18.6 % (ref 12.0–46.0)
Lymphs Abs: 1.2 10*3/uL (ref 0.7–4.0)
MCHC: 32.4 g/dL (ref 30.0–36.0)
MCV: 93 fl (ref 78.0–100.0)
Monocytes Absolute: 0.3 10*3/uL (ref 0.1–1.0)
Monocytes Relative: 5.4 % (ref 3.0–12.0)
Neutro Abs: 4.8 10*3/uL (ref 1.4–7.7)
Neutrophils Relative %: 75.2 % (ref 43.0–77.0)
Platelets: 206 10*3/uL (ref 150.0–400.0)
RBC: 3.67 Mil/uL — ABNORMAL LOW (ref 4.22–5.81)
RDW: 17.1 % — ABNORMAL HIGH (ref 11.5–15.5)
WBC: 6.4 10*3/uL (ref 4.0–10.5)

## 2019-09-08 LAB — TSH: TSH: 1.32 u[IU]/mL (ref 0.35–4.50)

## 2019-09-08 LAB — SEDIMENTATION RATE: Sed Rate: 34 mm/hr — ABNORMAL HIGH (ref 0–20)

## 2019-09-12 ENCOUNTER — Ambulatory Visit (INDEPENDENT_AMBULATORY_CARE_PROVIDER_SITE_OTHER): Payer: Medicare Other | Admitting: Pharmacist

## 2019-09-12 DIAGNOSIS — I4891 Unspecified atrial fibrillation: Secondary | ICD-10-CM | POA: Diagnosis not present

## 2019-09-12 NOTE — Chronic Care Management (AMB) (Signed)
Chronic Care Management   Follow Up Note   09/12/2019 Name: Carl [name] MRN: [id number] DOB: Nov 29, 1935  Referred by: [name], MD Reason for referral : Chronic Care Management (Medication Management)   Carl [name] is a 84 y.o. year old male who is a primary care patient of Carl [name], [name], MD. The CCM team was consulted for assistance with chronic disease management and care coordination needs.    Contacted patient's daughter, Carl [name], for medication management review.   Review of patient status, including review of consultants reports, relevant laboratory and other test results, and collaboration with appropriate care team members and the patient's provider was performed as part of comprehensive patient evaluation and provision of chronic care management services.    SDOH (Social Determinants of Health) screening performed today: Financial Strain . See Care Plan for related entries.   Outpatient Encounter Medications as of 09/12/2019  Medication Sig Note  . amiodarone (PACERONE) 200 MG tablet TAKE 1 TABLET BY MOUTH EVERY DAY   . apixaban (ELIQUIS) 5 MG TABS tablet Take 1 tablet (5 mg total) by mouth 2 (two) times daily.   [name] omeprazole (PRILOSEC) 40 MG capsule TAKE 1 CAPSULE BY MOUTH EVERY DAY   . rosuvastatin (CRESTOR) 40 MG tablet Take 1 tablet (40 mg total) by mouth daily.   . tamsulosin (FLOMAX) 0.4 MG CAPS capsule Take 1 capsule (0.4 mg total) by mouth daily after supper.   [name] acetaminophen (TYLENOL) 500 MG tablet Take 1,000 mg by mouth every 6 (six) hours as needed for mild pain (for pain).  07/04/2019: Taking 2 Q4-6H  . loratadine (CLARITIN) 10 MG tablet Take 1 tablet (10 mg total) by mouth daily.    No facility-administered encounter medications on file as of 09/12/2019.     Objective:   Goals Addressed            This Visit's Progress     Patient Stated   . "I'm worried about his health declining (pt-stated)       Current Barriers:   . Financial Barriers: Eliquis assistance ran out 07/27/19.  o Patient was assisted by [facility] counselor in applying for Medicare Extra Help ~09/02/19. Decision can take 4-6 weeks to come in the mail o Also collaborated w/ cardiology to attempt a Tier Exception request for Eliquis, given the risk of exposure during [place] pandemic of coming in for INR checks  o Carl [name] does confirm that she picked up a 30 day supply of Eliquis for $47 earlier last week.  . Polypharmacy, complicated patient with multiple chronic conditions including atrial fibrillation, CKD, ASCVD (hx CABG), hx prostate cancer, lung nodule - o Atrial fibrillation: Eliquis 5 mg BID, amiodarone 200 mg daily o ASCVD risk reduction: rosuvastatin 40 mg daily; LDL at goal <70 o GERD: omeprazole 40 mg daily o Recurrent kidney stones: tamsulosin 0.4 mg daily;   Pharmacist Clinical Goal(s):  [name] Over the next 90 days, patient will work with PharmD and providers for optimized medication management  Interventions: . Carl [name] notes that she has not received any notification of approval or denial of Extra Help. Encouraged her to continue to keep an eye on the mail. . [place] to follow up on status of Tier Exception Request faxed on 08/26/19 by Verlon Au. Spoke to multiple departments, and they note they did not receive this. They recommended it could be faxed to [phone]. Will update Iva to this.  . Reviewed upcoming Palliative Care call on Thursday. Carl [name] confirmed  that she will keep this appointment. . Reviewed recent lab work. She notes that her father's weight has stayed stable at 140 lbs since the last appointment w/ Dr. Caryl [name].   Patient Self Care Activities:  . Patient will take medications as prescribed  Please see past updates related to this goal by clicking on the "Past Updates" button in the selected goal          Plan:  - Scheduled f/u call 10/31/19  [name], PharmD, BCACP, [place]  Pharmacist [place] [phone]

## 2019-09-12 NOTE — Patient Instructions (Signed)
Visit Information  Goals Addressed            This Visit's Progress     Patient Stated   . "I'm worried about his health declining (pt-stated)       Current Barriers:  . Financial Barriers: Eliquis assistance ran out 07/27/19.  o Patient was assisted by [facility] counselor in applying for Medicare Extra Help ~09/02/19. Decision can take 4-6 weeks to come in the mail o Also collaborated w/ cardiology to attempt a Tier Exception request for Eliquis, given the risk of exposure during [place] pandemic of coming in for INR checks  o [name] does confirm that she picked up a 30 day supply of Eliquis for $47 earlier last week.  . Polypharmacy, complicated patient with multiple chronic conditions including atrial fibrillation, CKD, ASCVD (hx CABG), hx prostate cancer, lung nodule - o Atrial fibrillation: Eliquis 5 mg BID, amiodarone 200 mg daily o ASCVD risk reduction: rosuvastatin 40 mg daily; LDL at goal <70 o GERD: omeprazole 40 mg daily o Recurrent kidney stones: tamsulosin 0.4 mg daily;   Pharmacist Clinical Goal(s):  [name] Over the next 90 days, patient will work with PharmD and providers for optimized medication management  Interventions: . [name] notes that she has not received any notification of approval or denial of Extra Help. Encouraged her to continue to keep an eye on the mail. . [place] to follow up on status of Tier Exception Request faxed on 08/26/19 by Verlon Au. Spoke to multiple departments, and they note they did not receive this. They recommended it could be faxed to [phone]. Will update Iva to this.  . Reviewed upcoming Palliative Care call on Thursday. [name] confirmed that she will keep this appointment. . Reviewed recent lab work. She notes that her father's weight has stayed stable at 140 lbs since the last appointment w/ Dr. [name].   Patient Self Care Activities:  . Patient will take medications as prescribed  Please see past updates  related to this goal by clicking on the "Past Updates" button in the selected goal         The patient verbalized understanding of instructions provided today and declined a print copy of patient instruction materials.   Plan:  - Scheduled f/u call 10/31/19  [name], PharmD, BCACP, CPP Clinical Pharmacist [place] [phone]

## 2019-09-15 ENCOUNTER — Other Ambulatory Visit: Payer: Self-pay

## 2019-09-15 ENCOUNTER — Other Ambulatory Visit: Payer: Medicare Other | Admitting: Nurse Practitioner

## 2019-09-15 ENCOUNTER — Telehealth: Payer: Self-pay | Admitting: Nurse Practitioner

## 2019-09-15 NOTE — Telephone Encounter (Signed)
I attempted to call [name], Carl [name]'s sister for initial scheduled PC telemedicine visit, no answer, unable to leave a message, will continue to try to contact

## 2019-09-21 ENCOUNTER — Encounter: Payer: Self-pay | Admitting: *Deleted

## 2019-10-02 ENCOUNTER — Other Ambulatory Visit: Payer: Self-pay | Admitting: Cardiovascular Disease

## 2019-10-04 ENCOUNTER — Encounter: Payer: Self-pay | Admitting: Nurse Practitioner

## 2019-10-04 ENCOUNTER — Other Ambulatory Visit: Payer: Medicare Other | Admitting: Nurse Practitioner

## 2019-10-04 ENCOUNTER — Other Ambulatory Visit: Payer: Self-pay

## 2019-10-04 DIAGNOSIS — Z515 Encounter for palliative care: Secondary | ICD-10-CM

## 2019-10-04 DIAGNOSIS — I272 Pulmonary hypertension, unspecified: Secondary | ICD-10-CM

## 2019-10-04 NOTE — Progress Notes (Signed)
[facility] Consult Note Telephone: [phone]  Fax: [phone]  PATIENT NAME: Carl [name] DOB: [birth year] MRN: [id number]  PRIMARY CARE PROVIDER:   [name], MD  REFERRING PROVIDER:  [name], MD [address] [place],  [place] [address]  Due to the COVID-19 crisis, this visit was done via telemedicine from my office and it was initiated and consent by this patient and or family.  I was asked by Dr Carl [name] to see Carl [name] for Palliative care consult for goals of care   RECOMMENDATIONS and PLAN:  1. ACP: Full code, continue to discuss medical goals of care, wishes, pending further discussion with Carl [name] his daughter   2. Palliative care encounter; Palliative medicine team will continue to support patient, patient's family, and medical team. Visit consisted of counseling and education dealing with the complex and emotionally intense issues of symptom management and palliative care in the setting of serious and potentially life-threatening illness  I spent 65 minutes providing this consultation,  from 1:45pm to 2:50pm. More than 50% of the time in this consultation was spent coordinating communication.   HISTORY OF PRESENT ILLNESS:  Carl [name] is a 84 y.o. year old male with multiple medical problems including Prostate cancer, lung nodule, Atrial fibrillation, history of cardioversion, abdominal aortic aneurysm, pulmonary hypertension, myocardial infarction, coronary artery disease s/p cabg, moderate mitral regurgitation, ischemic cardiomyopathy, intracranial hematoma, hypertension, hyperlipidemia, gerd, chronic kidney disease, anemia, bladder calculus, renal calculus, history of: polyps, hip fracture, inguinal hernia repair. Hospitalized 3 / 10 / 2020 to 3 / 16 / 2020  weakness,  intracranial hematoma evaluated by Neurosurgery, Underwent left hip surgery 3 / 12 / 2020 for left hip fracture.  Hospitalised 8 / 5 / 2020 to 8/7 / 2020 for altered mental status secondary to UTI. Return to the hospitalized 8 / 11 / 2020 to 8 / 13 / 2020 for altered mental status likely secondary to UTI recurrent receiving antibiotic therapy. Acute on chronic kidney disease, proximal atrial fibrillation continued on Eliquis and amiodarone. AAA would CT abdomen 3.3 cm in for a renal abdominal aortic aneurysm recommended ultrasound 3 years. He was seen by primary provider Dr Carl [name] 2 / 2 / 2021 with noted being 16 lbs down though eating fairly well, mild depression at times.  I called Carl [name] for schedule follow-up palliative care telemedicine is video not available telephonic visit. We talked about purpose of palliative care visit and Carl [name] in agreement. We talked about how Carl [name] has been feeling. Carl [name] endorses that he has been weak and tired. Carl [name] endorses that he is able to ambulate, get himself dressed, toilet himself. Carl [name] endorse is he is able to feed himself. We talked about Carl [name]'s appetite as he has had a 16 lb slow weight loss. Carl [name] endorse his overall he feels like he is doing okay. Previous discussion with the net his daughter Carl [name] has been declining, sleeping a lot and giving up. Carl [name] and I talked about medical goals of care. We talked about quality of life. And that currently not present Carl [name] asked that I contact Carl [name] further discussion of medical goals of care. Carl [name] endorses Carl [name] should be home later on this afternoon. Discuss with Carl [name] will recontact today for further discussion of medical goals of care. Carl [name] in agreement. Therapeutic listening and emotional support provided. Contact information. Questions answered to satisfaction. I have  attempted to recontact Carl [name] and will continue to try to reach her for further discussion of medical goals of care  Palliative Care was asked to help address  goals of care.   CODE STATUS: full code  PPS: 40% HOSPICE ELIGIBILITY/DIAGNOSIS: TBD  PAST MEDICAL HISTORY:  Past Medical History:  Diagnosis Date  . (HFpEF) heart failure with preserved ejection fraction ([place])    a. 08/2016 Echo: EF 50-55%.  [name] AAA (abdominal aortic aneurysm) ([place]) 06/05/2019   abdominal aortic aneurysm measuring 3.1 cm.  . Arthritis    arms  . Bladder calculus   . Chronic anemia   . Chronic anxiety   . CKD (chronic kidney disease), stage III   . Coronary artery disease CARDIOLOGIST-  DR Rockey Situ   a. CABG 2006 with  LIMA-LAD, SVG-Ramus, SVG-OM, seq SVG-PLV-PDA.  . DOE (dyspnea on exertion)   . GERD (gastroesophageal reflux disease)   . Heart murmur   . Hematuria   . Hip fracture ([place]) 10/05/2018  . History of bladder stone   . History of colon polyps   . History of non-ST elevation myocardial infarction (NSTEMI) 03/30/2005   post op recovery inguinal hernia repair  . History of pressure ulcer   . History of recurrent UTIs    last admission 08-04-2018 pseudomonas UTI  . Hyperlipidemia   . Hyperplasia of prostate without lower urinary tract symptoms (LUTS)   . Hypertension   . Indwelling Foley catheter present   . Intracranial hematoma ([place]) 10/05/2018  . Ischemic cardiomyopathy    a. echo (01/13) 45-50% septal akinesis, mild MR; b. Echo  (01/16) ef 60-65%, mid-apical anteroseptal hypokinesis; c. echo 09-16-2016 ef 50-55%  . Left ureteral stone   . Lung nodule    noted 08/ 2018;  followed by pcp  . LVH (left ventricular hypertrophy) 09/16/2016   Moderate noted on ECHO  . Moderate mitral regurgitation    a.08/2016 Echo: Mod MR/TR.  [name] Orthostatic hypotension   . PAC (premature atrial contraction)   . Paroxysmal atrial flutter ([place])    a. dx 08/2016, started on amiodarone.  . Persistent atrial fibrillation [facility]) followed by cardiologist-- dr Rockey Situ   a. dx AFib 08/2016 s/p DCCV 10/2016-->eliquis (CHA2DS2VASc = 5).  . Pneumonia   . Pulmonary hypertension  ([place])    last echo in epic 09-16-2016  . Recurrent prostate cancer ([place]) UROLOGIST-- DR BELL   s/p  radioactive prostate seed implants in 1997;    due to PSA rising pt has been doing lupron injection's few past several yrs at dr bell office  . Renal calculi    bilateral per CT 06-18-2018  . S/P CABG x 5 04/04/2005   LIMA to LAD,  SVG to Ramus,  SVG to OM,  seqSVG to PLV and PDA  . Wears glasses     SOCIAL HX:  Social History   Tobacco Use  . Smoking status: Former Smoker    Years: 2.00    Types: Cigarettes    Quit date: 07/28/1960    Years since quitting: 59.2  . Smokeless tobacco: Never Used  Substance Use Topics  . Alcohol use: No    ALLERGIES:  Allergies  Allergen Reactions  . Ciprofloxacin Other (See Comments)    PER HEART DR     PERTINENT MEDICATIONS:  Outpatient Encounter Medications as of 10/04/2019  Medication Sig  . acetaminophen (TYLENOL) 500 MG tablet Take 1,000 mg by mouth every 6 (six) hours as needed for mild pain (for pain).   [name] amiodarone (  PACERONE) 200 MG tablet TAKE 1 TABLET BY MOUTH EVERY DAY  . apixaban (ELIQUIS) 5 MG TABS tablet Take 1 tablet (5 mg total) by mouth 2 (two) times daily.  [name] loratadine (CLARITIN) 10 MG tablet Take 1 tablet (10 mg total) by mouth daily.  [name] omeprazole (PRILOSEC) 40 MG capsule TAKE 1 CAPSULE BY MOUTH EVERY DAY  . rosuvastatin (CRESTOR) 40 MG tablet Take 1 tablet (40 mg total) by mouth daily.  . tamsulosin (FLOMAX) 0.4 MG CAPS capsule Take 1 capsule (0.4 mg total) by mouth daily after supper.   No facility-administered encounter medications on file as of 10/04/2019.    PHYSICAL EXAM:   Deferred  Rayana Geurin Z Weronika Birch, NP

## 2019-10-06 ENCOUNTER — Telehealth: Payer: Self-pay | Admitting: Nurse Practitioner

## 2019-10-06 NOTE — Telephone Encounter (Signed)
I called [name] Mr. Hammilton's daughter. [name] endorses not a good time to talk but scheduled a f/u pc visit for 10/07/2019 at 10am. Contact information provided

## 2019-10-07 ENCOUNTER — Other Ambulatory Visit: Payer: Medicare Other | Admitting: Nurse Practitioner

## 2019-10-07 ENCOUNTER — Encounter: Payer: Self-pay | Admitting: Nurse Practitioner

## 2019-10-07 ENCOUNTER — Other Ambulatory Visit: Payer: Self-pay

## 2019-10-07 DIAGNOSIS — Z515 Encounter for palliative care: Secondary | ICD-10-CM

## 2019-10-07 DIAGNOSIS — I272 Pulmonary hypertension, unspecified: Secondary | ICD-10-CM

## 2019-10-07 NOTE — Progress Notes (Signed)
[facility] Consult Note Telephone: [phone]  Fax: [phone]  PATIENT NAME: Carl [name] DOB: 02-Feb-1936 MRN: [id number]  PRIMARY CARE PROVIDER:   [name], MD  REFERRING PROVIDER:  [name], MD [address] [place],  [place] [address]  Due to the COVID-19 crisis, this visit was done via telemedicine from my office and it was initiated and consent by this patient and or family.   RECOMMENDATIONS and PLAN: 1.ACP: Full code, continue to discuss medical goals of care, wishes, pending further discussion with [name] his daughter  2.Palliative care encounter; Palliative medicine team will continue to support patient, patient's family, and medical team. Visit consisted of counseling and education dealing with the complex and emotionally intense issues of symptom management and palliative care in the setting of serious and potentially life-threatening illness   I spent 35 minutes providing this consultation,  from 9:55am to 10:30am. More than 50% of the time in this consultation was spent coordinating communication.   HISTORY OF PRESENT ILLNESS:  Carl [name] is a 84 y.o. year old male with multiple medical problems including Prostate cancer, lung nodule, Atrial fibrillation, history of cardioversion, abdominal aortic aneurysm, pulmonary hypertension, myocardial infarction, coronary artery disease s/p cabg, moderate mitral regurgitation, ischemic cardiomyopathy, intracranial hematoma, hypertension, hyperlipidemia, gerd, chronic kidney disease, anemia, bladder calculus, renal calculus, history of: polyps, hip fracture, inguinal hernia repair. Hospitalized 3 / 10 / 2020 to 3 / 16 / 2020 weakness, intracranial hematoma evaluated by Neurosurgery, Underwent left hip surgery 3 / 12 / 2020 for left hip fracture. Hospitalised 8 / 5 / 2020 to 8/7 / 2020 for altered mental status secondary to UTI. Return to  the hospitalized 8 / 11 / 2020 to 8 / 13 / 2020 for altered mental status likely secondary to UTI recurrent receiving antibiotic therapy. Acute on chronic kidney disease, proximal atrial fibrillation continued on Eliquis and amiodarone. AAA would CT abdomen 3.3 cm in for a renal abdominal aortic aneurysm recommended ultrasound 3 years. He was seen by primary provider Dr [name] 2 / 2 / 2021 with noted being 16 lbs down though eating fairly well, mild depression. I called [name], Carl [name]'s daughter for schedule palliative care visit. Carl [name] is eating breakfast come a few bites at a time. And at night I talked about purpose of palliative care visit. And that in Carl [name] in agreement. We talked about past medical history in the same chronic disease and progression. We talked about the functional level part of his hip fracture where he did require some assistance with ADLs, ambulated independently. After Mr Chirco's hip fracture year ago he is not required to use a walker for stability. Mr [name]'s life existence is from his recliner to the bathroom and back. Carl [name] sleeps about 20 hours in 24 hours a day. We talked about symptoms of shortness of breath which he does not seem to experience except for exhaustion with any type of exertion or activity including eating. We talked about his appetite [place]. [name] endorses Mr. Gordon has lost about 40 lb since his hip fracture less than a year ago. [name] endorses Mr. Wesche continues to appear to lose weight. [name] endorses Carl [name] only eats a few bites at each meal if any. Carl [name] has been skipping a lot of meals. We talked about medical goals of care including aggressive versus conservative versus comfort care. We talked about code status as Carl [name] currently is  a full code. [name] endorses that she wants to further discuss with family and Mr. Jared DNR prior to changing Mr. Osterhout to a DNR. We talked about  overall decline, debility and failure to thrive. [name] endorse is she feels like Carl [name] has given up on life as his existence twenty-four hours a day is in his recliner. We talked about option of Medicare hospice benefit. We talked about what services would be provided. [name] in agreement to further have case reviewed by [facility] for eligibility. We talked about Carl [name] and his thoughts. Carl [name] and [name] want to further discuss with family about hospice. We talked about role of palliative care and plan of care. Discuss that will follow up and scheduled appointment in one week per Annettes request for further discussion. Therapeutic listening and emotional support provided. Contact information provided. Questions answered to satisfaction. Palliative Care was asked to help to continue to address goals of care.   CODE STATUS: DNR  PPS: 40% HOSPICE ELIGIBILITY/DIAGNOSIS: TBD  PAST MEDICAL HISTORY:  Past Medical History:  Diagnosis Date  . (HFpEF) heart failure with preserved ejection fraction ([place])    a. 08/2016 Echo: EF 50-55%.  [name] AAA (abdominal aortic aneurysm) ([place]) 06/05/2019   abdominal aortic aneurysm measuring 3.1 cm.  . Arthritis    arms  . Bladder calculus   . Chronic anemia   . Chronic anxiety   . CKD (chronic kidney disease), stage III   . Coronary artery disease CARDIOLOGIST-  DR Rockey Situ   a. CABG 2006 with  LIMA-LAD, SVG-Ramus, SVG-OM, seq SVG-PLV-PDA.  . DOE (dyspnea on exertion)   . GERD (gastroesophageal reflux disease)   . Heart murmur   . Hematuria   . Hip fracture ([place]) 10/05/2018  . History of bladder stone   . History of colon polyps   . History of non-ST elevation myocardial infarction (NSTEMI) 03/30/2005   post op recovery inguinal hernia repair  . History of pressure ulcer   . History of recurrent UTIs    last admission 08-04-2018 pseudomonas UTI  . Hyperlipidemia   . Hyperplasia of prostate without lower urinary tract symptoms  (LUTS)   . Hypertension   . Indwelling Foley catheter present   . Intracranial hematoma ([place]) 10/05/2018  . Ischemic cardiomyopathy    a. echo (01/13) 45-50% septal akinesis, mild MR; b. Echo  (01/16) ef 60-65%, mid-apical anteroseptal hypokinesis; c. echo 09-16-2016 ef 50-55%  . Left ureteral stone   . Lung nodule    noted 08/ 2018;  followed by pcp  . LVH (left ventricular hypertrophy) 09/16/2016   Moderate noted on ECHO  . Moderate mitral regurgitation    a.08/2016 Echo: Mod MR/TR.  [name] Orthostatic hypotension   . PAC (premature atrial contraction)   . Paroxysmal atrial flutter ([place])    a. dx 08/2016, started on amiodarone.  . Persistent atrial fibrillation [facility]) followed by cardiologist-- dr Rockey Situ   a. dx AFib 08/2016 s/p DCCV 10/2016-->eliquis (CHA2DS2VASc = 5).  . Pneumonia   . Pulmonary hypertension ([place])    last echo in epic 09-16-2016  . Recurrent prostate cancer ([place]) UROLOGIST-- DR BELL   s/p  radioactive prostate seed implants in 1997;    due to PSA rising pt has been doing lupron injection's few past several yrs at dr bell office  . Renal calculi    bilateral per CT 06-18-2018  . S/P CABG x 5 04/04/2005   LIMA to LAD,  SVG to Ramus,  SVG to OM,  seqSVG to PLV and PDA  . Wears glasses     SOCIAL HX:  Social History   Tobacco Use  . Smoking status: Former Smoker    Years: 2.00    Types: Cigarettes    Quit date: 07/28/1960    Years since quitting: 59.2  . Smokeless tobacco: Never Used  Substance Use Topics  . Alcohol use: No    ALLERGIES:  Allergies  Allergen Reactions  . Ciprofloxacin Other (See Comments)    PER HEART DR     PERTINENT MEDICATIONS:  Outpatient Encounter Medications as of 10/07/2019  Medication Sig  . acetaminophen (TYLENOL) 500 MG tablet Take 1,000 mg by mouth every 6 (six) hours as needed for mild pain (for pain).   [name] amiodarone (PACERONE) 200 MG tablet TAKE 1 TABLET BY MOUTH EVERY DAY  . apixaban (ELIQUIS) 5 MG TABS tablet Take 1 tablet (5  mg total) by mouth 2 (two) times daily.  [name] loratadine (CLARITIN) 10 MG tablet Take 1 tablet (10 mg total) by mouth daily.  [name] omeprazole (PRILOSEC) 40 MG capsule TAKE 1 CAPSULE BY MOUTH EVERY DAY  . rosuvastatin (CRESTOR) 40 MG tablet Take 1 tablet (40 mg total) by mouth daily.  . tamsulosin (FLOMAX) 0.4 MG CAPS capsule Take 1 capsule (0.4 mg total) by mouth daily after supper.   No facility-administered encounter medications on file as of 10/07/2019.    PHYSICAL EXAM:  Deferred  Mulki Roesler Z Dimitriy Carreras, NP

## 2019-10-12 ENCOUNTER — Other Ambulatory Visit: Payer: Self-pay

## 2019-10-12 ENCOUNTER — Ambulatory Visit (INDEPENDENT_AMBULATORY_CARE_PROVIDER_SITE_OTHER): Payer: Medicare Other | Admitting: Family Medicine

## 2019-10-12 ENCOUNTER — Encounter: Payer: Self-pay | Admitting: Family Medicine

## 2019-10-12 ENCOUNTER — Other Ambulatory Visit: Payer: Medicare Other | Admitting: Nurse Practitioner

## 2019-10-12 ENCOUNTER — Telehealth: Payer: Self-pay | Admitting: Nurse Practitioner

## 2019-10-12 DIAGNOSIS — R634 Abnormal weight loss: Secondary | ICD-10-CM | POA: Diagnosis not present

## 2019-10-12 NOTE — Telephone Encounter (Signed)
I called Carl [name] for scheduled telemedicine PC visit for further discussion with Carl [name] about code status and that Hospice physicians deemed hospice eligible. Carl [name] endorses she is not at home with Carl [name] at present time. Asked Carl [name] if okay to further discuss with Carl [name], previous request from Carl [name] was that family has that discussion with Carl [name] first. Annettte in agreement. I attempted to call Carl [name] and no answer. I called Carl [name] to update. Carl [name] endorses Carl [name] is likely asleep. Carl [name] endorses they have a call to Carl [name] today. I called and left a message for Carl [name] about his hospice eligibility and continue attempts to further discuss code status. Nursing send message to Carl [name]

## 2019-10-12 NOTE — Progress Notes (Signed)
Virtual Visit via telephone Note  This visit type was conducted due to national recommendations for restrictions regarding the COVID-19 pandemic (e.g. social distancing).  This format is felt to be most appropriate for this patient at this time.  All issues noted in this document were discussed and addressed.  No physical exam was performed (except for noted visual exam findings with Video Visits).   I connected with Webb Laws today at  2:15 PM EDT by a video enabled telemedicine application or telephone and verified that I am speaking with the correct person using two identifiers. Location patient: home Location provider: home office Persons participating in the virtual visit: patient, provider, Shann Merrick (daughter)  I discussed the limitations, risks, security and privacy concerns of performing an evaluation and management service by telephone and the availability of in person appointments. I also discussed with the patient that there may be a patient responsible charge related to this service. The patient expressed understanding and agreed to proceed.  Interactive audio and video telecommunications were attempted between this provider and patient, however failed, due to patient having technical difficulties OR patient did not have access to video capability.  We continued and completed visit with audio only.  Reason for visit: Follow-up.  HPI: Weight loss: Patient notes his weight has been steady at 145 pounds.  He notes his appetite is good though his daughter notes its not great.  He has been eating ham biscuits and chicken as well as some vegetables.  He denies any pain.  He notes some phlegm in the morning though no cough or breathing issues.  No palpitations.  No bleeding issues.  He is currently being treated for prostate cancer.  The palliative care team got in touch with me as they felt the patient met criteria for hospice.  They also wanted [place] to have a discussion  regarding DNR with him.   ROS: See pertinent positives and negatives per HPI.  Past Medical History:  Diagnosis Date  . (HFpEF) heart failure with preserved ejection fraction ([place])    a. 08/2016 Echo: EF 50-55%.  [name] AAA (abdominal aortic aneurysm) ([place]) 06/05/2019   abdominal aortic aneurysm measuring 3.1 cm.  . Arthritis    arms  . Bladder calculus   . Chronic anemia   . Chronic anxiety   . CKD (chronic kidney disease), stage III   . Coronary artery disease CARDIOLOGIST-  DR Rockey Situ   a. CABG 2006 with  LIMA-LAD, SVG-Ramus, SVG-OM, seq SVG-PLV-PDA.  . DOE (dyspnea on exertion)   . GERD (gastroesophageal reflux disease)   . Heart murmur   . Hematuria   . Hip fracture ([place]) 10/05/2018  . History of bladder stone   . History of colon polyps   . History of non-ST elevation myocardial infarction (NSTEMI) 03/30/2005   post op recovery inguinal hernia repair  . History of pressure ulcer   . History of recurrent UTIs    last admission 08-04-2018 pseudomonas UTI  . Hyperlipidemia   . Hyperplasia of prostate without lower urinary tract symptoms (LUTS)   . Hypertension   . Indwelling Foley catheter present   . Intracranial hematoma ([place]) 10/05/2018  . Ischemic cardiomyopathy    a. echo (01/13) 45-50% septal akinesis, mild MR; b. Echo  (01/16) ef 60-65%, mid-apical anteroseptal hypokinesis; c. echo 09-16-2016 ef 50-55%  . Left ureteral stone   . Lung nodule    noted 08/ 2018;  followed by pcp  . LVH (left ventricular hypertrophy) 09/16/2016  Moderate noted on ECHO  . Moderate mitral regurgitation    a.08/2016 Echo: Mod MR/TR.  [name] Orthostatic hypotension   . PAC (premature atrial contraction)   . Paroxysmal atrial flutter ([place])    a. dx 08/2016, started on amiodarone.  . Persistent atrial fibrillation [facility]) followed by cardiologist-- dr Rockey Situ   a. dx AFib 08/2016 s/p DCCV 10/2016-->eliquis (CHA2DS2VASc = 5).  . Pneumonia   . Pulmonary hypertension ([place])    last echo in epic  09-16-2016  . Recurrent prostate cancer ([place]) UROLOGIST-- DR BELL   s/p  radioactive prostate seed implants in 1997;    due to PSA rising pt has been doing lupron injection's few past several yrs at dr bell office  . Renal calculi    bilateral per CT 06-18-2018  . S/P CABG x 5 04/04/2005   LIMA to LAD,  SVG to Ramus,  SVG to OM,  seqSVG to PLV and PDA  . Wears glasses     Past Surgical History:  Procedure Laterality Date  . CARDIAC CATHETERIZATION  04/02/2005   dr Doreatha Lew    Critical three-vessel coronary disease --  Essentially normal left ventricular function , ef 55%  . CARDIOVASCULAR STRESS TEST  05-01-2008  dr Doreatha Lew /  dr harding   Low risk nuclear study w/ no ischemia or scar but flattened septal motion/  ef 64%  . CARDIOVERSION N/A 11/19/2016   Procedure: CARDIOVERSION;  Surgeon: Jerline Pain, MD;  Location: [place];  Service: Cardiovascular;  Laterality: N/A;  . COLONOSCOPY N/A 10/23/2016   Procedure: COLONOSCOPY;  Surgeon: Milus Banister, MD;  Location: [facility];  Service: Endoscopy;  Laterality: N/A;  . CORONARY ARTERY BYPASS GRAFT  04/04/2005   dr  Ricard Dillon   LIMA - LAD,  SVG - Ramus, SVG - OM,  seqSVG - PLV and PDA  . CYSTOSCOPY WITH LITHOLAPAXY N/A 05/07/2016   Procedure: CYSTOSCOPY WITH LITHOLAPAXY;  Surgeon: Rana Snare, MD;  Location: [facility];  Service: Urology;  Laterality: N/A;  . CYSTOSCOPY WITH RETROGRADE PYELOGRAM, URETEROSCOPY AND STENT PLACEMENT  09/03/2018   Procedure: CYSTOSCOPY WITH RETROGRADE PYELOGRAM, URETEROSCOPY LASER LITHOTRIPSY AND STENT PLACEMENT;  Surgeon: Lucas Mallow, MD;  Location: [facility];  Service: Urology;;  . CYSTOSCOPY/URETEROSCOPY/HOLMIUM LASER/STENT PLACEMENT Right 06/15/2019   Procedure: CYSTOSCOPY RIGHT URETEROSCOPY HOLMIUM LASER STENT PLACEMENT;  Surgeon: Lucas Mallow, MD;  Location: [facility];  Service: Urology;  Laterality: Right;  . HOLMIUM LASER APPLICATION N/A 65/09/5463   Procedure: HOLMIUM LASER  APPLICATION;  Surgeon: Rana Snare, MD;  Location: [facility];  Service: Urology;  Laterality: N/A;  . INGUINAL HERNIA REPAIR Left 03-30-2005  dr Excell Seltzer   incarcerated   . RADIOACTIVE PROSTATE SEED IMPLANTS  1997  . TOTAL HIP ARTHROPLASTY Right 10/07/2018   Procedure: TOTAL HIP ARTHROPLASTY ANTERIOR APPROACH;  Surgeon: Rod Can, MD;  Location: [facility];  Service: Orthopedics;  Laterality: Right;  . TRANSTHORACIC ECHOCARDIOGRAM  08/24/2014   mid anteroseptal and distal septak hypokinetic,  mild focal basal LVH, ef 60-65%,  grade 1 diastolic dysfunction/  mild MR/  mild LAE/  mild dilated RV with normal RVSP/  trivial TR    Family History  Problem Relation Age of Onset  . Heart disease Mother   . Cancer Mother   . Cancer Father   . Cancer Brother     SOCIAL HX: Former smoker   Current Outpatient Medications:  .  acetaminophen (TYLENOL) 500 MG tablet, Take 1,000 mg by  mouth every 6 (six) hours as needed for mild pain (for pain). , Disp: , Rfl:  .  amiodarone (PACERONE) 200 MG tablet, TAKE 1 TABLET BY MOUTH EVERY DAY, Disp: 30 tablet, Rfl: 0 .  apixaban (ELIQUIS) 5 MG TABS tablet, Take 1 tablet (5 mg total) by mouth 2 (two) times daily., Disp: 60 tablet, Rfl: 6 .  loratadine (CLARITIN) 10 MG tablet, Take 1 tablet (10 mg total) by mouth daily., Disp: 30 tablet, Rfl: 11 .  omeprazole (PRILOSEC) 40 MG capsule, TAKE 1 CAPSULE BY MOUTH EVERY DAY, Disp: 90 capsule, Rfl: 1 .  rosuvastatin (CRESTOR) 40 MG tablet, Take 1 tablet (40 mg total) by mouth daily., Disp: 90 tablet, Rfl: 3 .  tamsulosin (FLOMAX) 0.4 MG CAPS capsule, Take 1 capsule (0.4 mg total) by mouth daily after supper., Disp: 30 capsule, Rfl: 0  EXAM: This was a telephone visit and thus no physical exam was completed.  ASSESSMENT AND PLAN:  Discussed the following assessment and plan:  Weight loss Weight reportedly stable.  Suspect weight loss is related to his prostate cancer.  We discussed hospice and  they are willing to proceed with that.  This will be communicated to the palliative care team.  I also had a discussion regarding DNR status and what it would mean to be resuscitated.  The patient at the time of our visit noted that he would be okay with trying to be revived and resuscitated though he would not want to be stuck on any machines to keep him alive.  After our visit I received a message from the palliative care team and they discussed DNR status with him as well and he had changed his mind to be DNR.   No orders of the defined types were placed in this encounter.   No orders of the defined types were placed in this encounter.    I discussed the assessment and treatment plan with the patient. The patient was provided an opportunity to ask questions and all were answered. The patient agreed with the plan and demonstrated an understanding of the instructions.   The patient was advised to call back or seek an in-person evaluation if the symptoms worsen or if the condition fails to improve as anticipated.  I provided 10 minutes of non-face-to-face time during this encounter.   [name], MD

## 2019-10-13 ENCOUNTER — Telehealth: Payer: Self-pay | Admitting: Nurse Practitioner

## 2019-10-13 ENCOUNTER — Telehealth: Payer: Self-pay

## 2019-10-13 NOTE — Telephone Encounter (Signed)
Noted and agree. 

## 2019-10-13 NOTE — Telephone Encounter (Signed)
I called Carl [name], daughter and Carl [name]. We talked about discussion with Dr [name] yesterday with code status and hospice. Carl [name] endorses Carl [name] did not want to be resuscitated, wishes to be DNR, golden rod completed placed in [place]. Also Carl [name] endorses Carl [name] wishes for hospice services. Discussed will contact Dr [name] for order. Emotional support provided  Total time 15 minutes Documentation 5 minutes Phone discussion 10 minutes

## 2019-10-17 ENCOUNTER — Other Ambulatory Visit: Payer: Self-pay | Admitting: Cardiovascular Disease

## 2019-10-17 NOTE — Telephone Encounter (Signed)
Please schedule overdue F/U with Dr. Rockey Situ. Thanks!

## 2019-10-17 NOTE — Telephone Encounter (Signed)
Attempted to schedule.  LMOV to call office.  ° °

## 2019-10-18 NOTE — Assessment & Plan Note (Addendum)
Weight reportedly stable.  Suspect weight loss is related to his prostate cancer.  We discussed hospice and they are willing to proceed with that.  This will be communicated to the palliative care team.  I also had a discussion regarding DNR status and what it would mean to be resuscitated.  The patient at the time of our visit noted that he would be okay with trying to be revived and resuscitated though he would not want to be stuck on any machines to keep him alive.  After our visit I received a message from the palliative care team and they discussed DNR status with him as well and he had changed his mind to be DNR.

## 2019-10-19 ENCOUNTER — Telehealth: Payer: Self-pay | Admitting: Family Medicine

## 2019-10-19 NOTE — Telephone Encounter (Signed)
Carl [name] with Hospice called wanting to see if Dr. [name] could prescribe Xanax for pt he is having anxiety and unable to sleep at night  [facility]

## 2019-10-19 NOTE — Telephone Encounter (Signed)
April with Hospice called wanting to see if Dr. [name] could prescribe Xanax for pt he is having anxiety and unable to sleep at night  [facility] [place]

## 2019-10-19 NOTE — Telephone Encounter (Signed)
Can you call the patient and see what issues he is having with sleep and anxiety?

## 2019-10-20 ENCOUNTER — Telehealth: Payer: Self-pay

## 2019-10-20 NOTE — Telephone Encounter (Signed)
I called and spoke with the patients caregiver and they stated that he is mostly sleeping all during the day and he is up all night and he is having a lot of anxiety.  Rayhaan Huster,cma

## 2019-10-21 ENCOUNTER — Telehealth: Payer: Self-pay

## 2019-10-21 MED ORDER — ALPRAZOLAM 0.25 MG PO TABS: 0.2500 mg | ORAL_TABLET | Freq: Two times a day (BID) | ORAL | 0 refills | Status: DC | PRN

## 2019-10-21 NOTE — Telephone Encounter (Signed)
See other phone note. Sent to pharmacy.

## 2019-10-21 NOTE — Telephone Encounter (Signed)
I sent in a low dose of xanax for him to try. They need to monitor for drowsiness with this and if he is having drowsiness they need to let us know.

## 2019-10-21 NOTE — Telephone Encounter (Signed)
I called and spoke with April and informed her that a low dose of Xanax was sent to the pharmacy and to monitor the patient for drowsiness and she understood.  Shemiah Rosch,cma

## 2019-10-21 NOTE — Addendum Note (Signed)
Addended by: [name] Aragon Scarantino G on: 10/21/2019 12:47 PM   Modules accepted: Orders

## 2019-10-21 NOTE — Telephone Encounter (Signed)
A comfort care order was signed and faxed to [facility] on 10/20/2019 @ 3:34 pm. Confirmation given.  Ezekial Arns,cma

## 2019-10-31 ENCOUNTER — Ambulatory Visit (INDEPENDENT_AMBULATORY_CARE_PROVIDER_SITE_OTHER): Payer: Medicare Other | Admitting: Pharmacist

## 2019-10-31 DIAGNOSIS — I4819 Other persistent atrial fibrillation: Secondary | ICD-10-CM | POA: Diagnosis not present

## 2019-10-31 DIAGNOSIS — I10 Essential (primary) hypertension: Secondary | ICD-10-CM | POA: Diagnosis not present

## 2019-10-31 DIAGNOSIS — Z951 Presence of aortocoronary bypass graft: Secondary | ICD-10-CM

## 2019-10-31 NOTE — Telephone Encounter (Signed)
No ans no vm   °

## 2019-10-31 NOTE — Patient Instructions (Signed)
Visit Information  Goals Addressed            This Visit's Progress     Patient Stated   . "I'm worried about his health declining (pt-stated)       [place] (see longtitudinal plan of care for additional care plan information)  Current Barriers:  Financial Barriers: Eliquis assistance ran out 07/27/19.  o Patient was put in touch with [place] [facility], thought they had applied for Extra Help on 09/02/19 - however, [name] notes today that they have not heard anything from [facility].  o Tier Exception Request denied for Eliquis . Polypharmacy, complicated patient with multiple chronic conditions including atrial fibrillation, CKD, ASCVD (hx CABG), hx prostate cancer, lung nodule - Connected w/ Palliative Care, they recommended Hospice. [name] notes that a Hospice nurse comes out 1-2 times per week.  o Atrial fibrillation: Eliquis 5 mg BID, amiodarone 200 mg daily o ASCVD risk reduction: rosuvastatin 40 mg daily; LDL at goal <70 o GERD: omeprazole 40 mg daily o Recurrent kidney stones: tamsulosin 0.4 mg daily;  o Anxiety/insomnia: alprazolam 0.25 mg QPM PRN sleep   Pharmacist Clinical Goal(s):  [name] Over the next 90 days, patient will work with PharmD and providers for optimized medication management  Interventions: . Reviewed current health needs. [name] is appreciate for the support.  . Assisted in completion of Medicare Extra Help application. Counseled [name] that determination should come in the mail in 4-6 weeks. If approved, this will help w/ the cost of medications that are not covered on Hospice benefit.   Patient Self Care Activities:  . Patient will take medications as prescribed  Please see past updates related to this goal by clicking on the "Past Updates" button in the selected goal         Patient verbalizes understanding of instructions provided today.   Plan:  - Scheduled f/u call 01/19/20  [name], PharmD, BCACP, CPP Clinical Pharmacist [facility] [employer] [phone]

## 2019-10-31 NOTE — Chronic Care Management (AMB) (Signed)
Chronic Care Management   Follow Up Note   10/31/2019 Name: Carl [name] MRN: [id number] DOB: Mar 07, 1936  Referred by: [name], MD Reason for referral : Chronic Care Management (Medication Management)   Carl [name] is a 84 y.o. year old male who is a primary care patient of [name], [name], MD. The CCM team was consulted for assistance with chronic disease management and care coordination needs.    Contacted patient's daughter/DPR, [name], for medication management review.   Review of patient status, including review of consultants reports, relevant laboratory and other test results, and collaboration with appropriate care team members and the patient's provider was performed as part of comprehensive patient evaluation and provision of chronic care management services.    SDOH (Social Determinants of Health) assessments performed: Yes See Care Plan activities for detailed interventions related to SDOH)  SDOH Interventions     Most Recent Value  SDOH Interventions  SDOH Interventions for the Following Domains  Financial Strain  Financial Strain Interventions  Other (Comment) [medicare extra help application]       Outpatient Encounter Medications as of 10/31/2019  Medication Sig Note  . acetaminophen (TYLENOL) 500 MG tablet Take 1,000 mg by mouth every 6 (six) hours as needed for mild pain (for pain).  07/04/2019: Taking 2 Q4-6H  . ALPRAZolam (XANAX) 0.25 MG tablet Take 1 tablet (0.25 mg total) by mouth 2 (two) times daily as needed for anxiety.   [name] amiodarone (PACERONE) 200 MG tablet TAKE 1 TABLET BY MOUTH EVERY DAY   . apixaban (ELIQUIS) 5 MG TABS tablet Take 1 tablet (5 mg total) by mouth 2 (two) times daily.   [name] loratadine (CLARITIN) 10 MG tablet Take 1 tablet (10 mg total) by mouth daily.   [name] omeprazole (PRILOSEC) 40 MG capsule TAKE 1 CAPSULE BY MOUTH EVERY DAY   . rosuvastatin (CRESTOR) 40 MG tablet Take 1 tablet (40 mg total) by mouth daily.   .  tamsulosin (FLOMAX) 0.4 MG CAPS capsule Take 1 capsule (0.4 mg total) by mouth daily after supper.    No facility-administered encounter medications on file as of 10/31/2019.     Objective:   Goals Addressed            This Visit's Progress     Patient Stated   . "I'm worried about his health declining (pt-stated)       [place] (see longtitudinal plan of care for additional care plan information)  Current Barriers:  Financial Barriers: Eliquis assistance ran out 07/27/19.  o Patient was put in touch with [place] [facility], thought they had applied for Extra Help on 09/02/19 - however, [name] notes today that they have not heard anything from [facility].  o Tier Exception Request denied for Eliquis . Polypharmacy, complicated patient with multiple chronic conditions including atrial fibrillation, CKD, ASCVD (hx CABG), hx prostate cancer, lung nodule - Connected w/ Palliative Care, they recommended Hospice. [name] notes that a Hospice nurse comes out 1-2 times per week.  o Atrial fibrillation: Eliquis 5 mg BID, amiodarone 200 mg daily o ASCVD risk reduction: rosuvastatin 40 mg daily; LDL at goal <70 o GERD: omeprazole 40 mg daily o Recurrent kidney stones: tamsulosin 0.4 mg daily;  o Anxiety/insomnia: alprazolam 0.25 mg QPM PRN sleep   Pharmacist Clinical Goal(s):  [name] Over the next 90 days, patient will work with PharmD and providers for optimized medication management  Interventions: . Reviewed current health needs. [name] is appreciate for the support.  [name]  Assisted in completion of Medicare Extra Help application. Counseled [name] that determination should come in the mail in 4-6 weeks. If approved, this will help w/ the cost of medications that are not covered on Hospice benefit.   Patient Self Care Activities:  . Patient will take medications as prescribed  Please see past updates related to this goal by clicking on the "Past Updates" button in the selected goal          Plan:   - Scheduled f/u call 01/19/20  [name], PharmD, BCACP, [place] Pharmacist [place] [phone]

## 2019-11-10 ENCOUNTER — Other Ambulatory Visit: Payer: Medicare Other

## 2019-11-11 NOTE — Telephone Encounter (Signed)
Attempted to schedule no ans no vm    Unable to contact .  Closing encounter

## 2019-11-26 DIAGNOSIS — R296 Repeated falls: Secondary | ICD-10-CM

## 2019-11-26 HISTORY — DX: Repeated falls: R29.6

## 2019-11-29 ENCOUNTER — Other Ambulatory Visit: Payer: Self-pay | Admitting: Cardiovascular Disease

## 2019-12-16 ENCOUNTER — Other Ambulatory Visit: Payer: Self-pay | Admitting: Cardiovascular Disease

## 2019-12-19 ENCOUNTER — Telehealth: Payer: Medicare Other

## 2019-12-19 ENCOUNTER — Encounter (HOSPITAL_COMMUNITY): Payer: Self-pay | Admitting: Internal Medicine

## 2019-12-19 ENCOUNTER — Emergency Department (HOSPITAL_COMMUNITY): Payer: Medicare Other

## 2019-12-19 ENCOUNTER — Other Ambulatory Visit: Payer: Self-pay

## 2019-12-19 ENCOUNTER — Inpatient Hospital Stay (HOSPITAL_COMMUNITY)
Admission: EM | Admit: 2019-12-19 | Discharge: 2019-12-23 | DRG: 689 | Disposition: A | Discharge status: Skilled Nursing Facility | Payer: Medicare Other | Attending: Internal Medicine | Admitting: Internal Medicine

## 2019-12-19 ENCOUNTER — Ambulatory Visit: Payer: Medicare Other | Admitting: Pharmacist

## 2019-12-19 DIAGNOSIS — Z20822 Contact with and (suspected) exposure to covid-19: Secondary | ICD-10-CM | POA: Diagnosis present

## 2019-12-19 DIAGNOSIS — E860 Dehydration: Secondary | ICD-10-CM | POA: Diagnosis not present

## 2019-12-19 DIAGNOSIS — Z87440 Personal history of urinary (tract) infections: Secondary | ICD-10-CM

## 2019-12-19 DIAGNOSIS — D539 Nutritional anemia, unspecified: Secondary | ICD-10-CM | POA: Diagnosis present

## 2019-12-19 DIAGNOSIS — N401 Enlarged prostate with lower urinary tract symptoms: Secondary | ICD-10-CM | POA: Diagnosis present

## 2019-12-19 DIAGNOSIS — Z87442 Personal history of urinary calculi: Secondary | ICD-10-CM

## 2019-12-19 DIAGNOSIS — Y92009 Unspecified place in unspecified non-institutional (private) residence as the place of occurrence of the external cause: Secondary | ICD-10-CM

## 2019-12-19 DIAGNOSIS — M810 Age-related osteoporosis without current pathological fracture: Secondary | ICD-10-CM | POA: Diagnosis present

## 2019-12-19 DIAGNOSIS — Z86010 Personal history of colonic polyps: Secondary | ICD-10-CM

## 2019-12-19 DIAGNOSIS — R5381 Other malaise: Secondary | ICD-10-CM | POA: Diagnosis present

## 2019-12-19 DIAGNOSIS — R531 Weakness: Secondary | ICD-10-CM

## 2019-12-19 DIAGNOSIS — N3001 Acute cystitis with hematuria: Secondary | ICD-10-CM | POA: Diagnosis not present

## 2019-12-19 DIAGNOSIS — I4819 Other persistent atrial fibrillation: Secondary | ICD-10-CM

## 2019-12-19 DIAGNOSIS — I252 Old myocardial infarction: Secondary | ICD-10-CM

## 2019-12-19 DIAGNOSIS — Z888 Allergy status to other drugs, medicaments and biological substances status: Secondary | ICD-10-CM

## 2019-12-19 DIAGNOSIS — I714 Abdominal aortic aneurysm, without rupture: Secondary | ICD-10-CM | POA: Diagnosis present

## 2019-12-19 DIAGNOSIS — Z6820 Body mass index (BMI) 20.0-20.9, adult: Secondary | ICD-10-CM

## 2019-12-19 DIAGNOSIS — L89312 Pressure ulcer of right buttock, stage 2: Secondary | ICD-10-CM | POA: Diagnosis present

## 2019-12-19 DIAGNOSIS — I951 Orthostatic hypotension: Secondary | ICD-10-CM | POA: Diagnosis present

## 2019-12-19 DIAGNOSIS — I2510 Atherosclerotic heart disease of native coronary artery without angina pectoris: Secondary | ICD-10-CM | POA: Diagnosis present

## 2019-12-19 DIAGNOSIS — E43 Unspecified severe protein-calorie malnutrition: Secondary | ICD-10-CM | POA: Diagnosis present

## 2019-12-19 DIAGNOSIS — Z87891 Personal history of nicotine dependence: Secondary | ICD-10-CM

## 2019-12-19 DIAGNOSIS — Z66 Do not resuscitate: Secondary | ICD-10-CM | POA: Diagnosis present

## 2019-12-19 DIAGNOSIS — Z951 Presence of aortocoronary bypass graft: Secondary | ICD-10-CM

## 2019-12-19 DIAGNOSIS — R627 Adult failure to thrive: Secondary | ICD-10-CM | POA: Diagnosis present

## 2019-12-19 DIAGNOSIS — N12 Tubulo-interstitial nephritis, not specified as acute or chronic: Secondary | ICD-10-CM | POA: Diagnosis present

## 2019-12-19 DIAGNOSIS — Z886 Allergy status to analgesic agent status: Secondary | ICD-10-CM

## 2019-12-19 DIAGNOSIS — Z8249 Family history of ischemic heart disease and other diseases of the circulatory system: Secondary | ICD-10-CM

## 2019-12-19 DIAGNOSIS — N183 Chronic kidney disease, stage 3 unspecified: Secondary | ICD-10-CM

## 2019-12-19 DIAGNOSIS — K219 Gastro-esophageal reflux disease without esophagitis: Secondary | ICD-10-CM | POA: Diagnosis present

## 2019-12-19 DIAGNOSIS — Z7901 Long term (current) use of anticoagulants: Secondary | ICD-10-CM

## 2019-12-19 DIAGNOSIS — R55 Syncope and collapse: Secondary | ICD-10-CM | POA: Diagnosis not present

## 2019-12-19 DIAGNOSIS — E861 Hypovolemia: Secondary | ICD-10-CM | POA: Diagnosis present

## 2019-12-19 DIAGNOSIS — N179 Acute kidney failure, unspecified: Secondary | ICD-10-CM | POA: Diagnosis present

## 2019-12-19 DIAGNOSIS — N1832 Chronic kidney disease, stage 3b: Secondary | ICD-10-CM | POA: Diagnosis present

## 2019-12-19 DIAGNOSIS — I272 Pulmonary hypertension, unspecified: Secondary | ICD-10-CM | POA: Diagnosis present

## 2019-12-19 DIAGNOSIS — I255 Ischemic cardiomyopathy: Secondary | ICD-10-CM | POA: Diagnosis present

## 2019-12-19 DIAGNOSIS — I4821 Permanent atrial fibrillation: Secondary | ICD-10-CM | POA: Diagnosis present

## 2019-12-19 DIAGNOSIS — E785 Hyperlipidemia, unspecified: Secondary | ICD-10-CM | POA: Diagnosis present

## 2019-12-19 DIAGNOSIS — Z79899 Other long term (current) drug therapy: Secondary | ICD-10-CM

## 2019-12-19 DIAGNOSIS — Z8673 Personal history of transient ischemic attack (TIA), and cerebral infarction without residual deficits: Secondary | ICD-10-CM

## 2019-12-19 DIAGNOSIS — R739 Hyperglycemia, unspecified: Secondary | ICD-10-CM | POA: Diagnosis present

## 2019-12-19 DIAGNOSIS — K5900 Constipation, unspecified: Secondary | ICD-10-CM | POA: Diagnosis present

## 2019-12-19 DIAGNOSIS — R296 Repeated falls: Secondary | ICD-10-CM | POA: Diagnosis present

## 2019-12-19 DIAGNOSIS — Z881 Allergy status to other antibiotic agents status: Secondary | ICD-10-CM

## 2019-12-19 DIAGNOSIS — D509 Iron deficiency anemia, unspecified: Secondary | ICD-10-CM | POA: Diagnosis present

## 2019-12-19 DIAGNOSIS — C61 Malignant neoplasm of prostate: Secondary | ICD-10-CM | POA: Diagnosis present

## 2019-12-19 DIAGNOSIS — I5032 Chronic diastolic (congestive) heart failure: Secondary | ICD-10-CM | POA: Diagnosis present

## 2019-12-19 DIAGNOSIS — B965 Pseudomonas (aeruginosa) (mallei) (pseudomallei) as the cause of diseases classified elsewhere: Secondary | ICD-10-CM | POA: Diagnosis present

## 2019-12-19 DIAGNOSIS — L89322 Pressure ulcer of left buttock, stage 2: Secondary | ICD-10-CM | POA: Diagnosis present

## 2019-12-19 DIAGNOSIS — Z96641 Presence of right artificial hip joint: Secondary | ICD-10-CM | POA: Diagnosis present

## 2019-12-19 DIAGNOSIS — W19XXXA Unspecified fall, initial encounter: Secondary | ICD-10-CM | POA: Diagnosis present

## 2019-12-19 DIAGNOSIS — I130 Hypertensive heart and chronic kidney disease with heart failure and stage 1 through stage 4 chronic kidney disease, or unspecified chronic kidney disease: Secondary | ICD-10-CM | POA: Diagnosis present

## 2019-12-19 LAB — URINALYSIS, ROUTINE W REFLEX MICROSCOPIC
Bilirubin Urine: NEGATIVE
Glucose, UA: NEGATIVE mg/dL
Ketones, ur: NEGATIVE mg/dL
Nitrite: POSITIVE — AB
Protein, ur: 30 mg/dL — AB
Specific Gravity, Urine: 1.017 (ref 1.005–1.030)
WBC, UA: >50 WBC/hpf — ABNORMAL HIGH (ref 0–5)
pH: 5 (ref 5.0–8.0)

## 2019-12-19 LAB — COMPREHENSIVE METABOLIC PANEL
ALT: 31 U/L (ref 0–44)
AST: 24 U/L (ref 15–41)
Albumin: 3.1 g/dL — ABNORMAL LOW (ref 3.5–5.0)
Alkaline Phosphatase: 61 U/L (ref 38–126)
Anion gap: 10 (ref 5–15)
BUN: 20 mg/dL (ref 8–23)
CO2: 24 mmol/L (ref 22–32)
Calcium: 8.8 mg/dL — ABNORMAL LOW (ref 8.9–10.3)
Chloride: 106 mmol/L (ref 98–111)
Creatinine, Ser: 1.3 mg/dL — ABNORMAL HIGH (ref 0.61–1.24)
GFR calc Af Amer: 59 mL/min — ABNORMAL LOW (ref >60)
GFR calc non Af Amer: 51 mL/min — ABNORMAL LOW (ref >60)
Glucose, Bld: 132 mg/dL — ABNORMAL HIGH (ref 70–99)
Potassium: 4.2 mmol/L (ref 3.5–5.1)
Sodium: 140 mmol/L (ref 135–145)
Total Bilirubin: 0.6 mg/dL (ref 0.3–1.2)
Total Protein: 6.1 g/dL — ABNORMAL LOW (ref 6.5–8.1)

## 2019-12-19 LAB — CBC WITH DIFFERENTIAL/PLATELET
Abs Immature Granulocytes: 0.02 10*3/uL (ref 0.00–0.07)
Basophils Absolute: 0 10*3/uL (ref 0.0–0.1)
Basophils Relative: 0 %
Eosinophils Absolute: 0 10*3/uL (ref 0.0–0.5)
Eosinophils Relative: 0 %
HCT: 33.1 % — ABNORMAL LOW (ref 39.0–52.0)
Hemoglobin: 10.3 g/dL — ABNORMAL LOW (ref 13.0–17.0)
Immature Granulocytes: 0 %
Lymphocytes Relative: 7 %
Lymphs Abs: 0.5 10*3/uL — ABNORMAL LOW (ref 0.7–4.0)
MCH: 30.8 pg (ref 26.0–34.0)
MCHC: 31.1 g/dL (ref 30.0–36.0)
MCV: 99.1 fL (ref 80.0–100.0)
Monocytes Absolute: 0.4 10*3/uL (ref 0.1–1.0)
Monocytes Relative: 7 %
Neutro Abs: 5.6 10*3/uL (ref 1.7–7.7)
Neutrophils Relative %: 86 %
Platelets: 145 10*3/uL — ABNORMAL LOW (ref 150–400)
RBC: 3.34 MIL/uL — ABNORMAL LOW (ref 4.22–5.81)
RDW: 16.8 % — ABNORMAL HIGH (ref 11.5–15.5)
WBC: 6.5 10*3/uL (ref 4.0–10.5)
nRBC: 0 % (ref 0.0–0.2)

## 2019-12-19 LAB — IRON AND TIBC
Iron: 25 ug/dL — ABNORMAL LOW (ref 45–182)
Saturation Ratios: 9 % — ABNORMAL LOW (ref 17.9–39.5)
TIBC: 287 ug/dL (ref 250–450)
UIBC: 262 ug/dL

## 2019-12-19 LAB — PROTIME-INR
INR: 1.4 — ABNORMAL HIGH (ref 0.8–1.2)
Prothrombin Time: 16.4 seconds — ABNORMAL HIGH (ref 11.4–15.2)

## 2019-12-19 LAB — SARS CORONAVIRUS 2 BY RT PCR (HOSPITAL ORDER, PERFORMED IN ~~LOC~~ HOSPITAL LAB): SARS Coronavirus 2: NEGATIVE

## 2019-12-19 LAB — CK: Total CK: 201 U/L (ref 49–397)

## 2019-12-19 LAB — VITAMIN D 25 HYDROXY (VIT D DEFICIENCY, FRACTURES): Vit D, 25-Hydroxy: 11.36 ng/mL — ABNORMAL LOW (ref 30–100)

## 2019-12-19 LAB — FERRITIN: Ferritin: 23 ng/mL — ABNORMAL LOW (ref 24–336)

## 2019-12-19 MED ORDER — SODIUM CHLORIDE 0.9 % IV SOLN
1.0000 g | INTRAVENOUS | Status: DC
  Administered 2019-12-19 – 2019-12-20 (×2): 1 g via INTRAVENOUS
  Filled 2019-12-19 (×2): qty 10

## 2019-12-19 MED ORDER — SODIUM CHLORIDE 0.9 % IV SOLN
1.0000 g | Freq: Once | INTRAVENOUS | Status: AC
  Administered 2019-12-19: 1 g via INTRAVENOUS
  Filled 2019-12-19: qty 10

## 2019-12-19 MED ORDER — TAMSULOSIN HCL 0.4 MG PO CAPS
0.4000 mg | ORAL_CAPSULE | Freq: Every day | ORAL | Status: DC
  Administered 2019-12-19 – 2019-12-22 (×4): 0.4 mg via ORAL
  Filled 2019-12-19 (×4): qty 1

## 2019-12-19 MED ORDER — SENNOSIDES-DOCUSATE SODIUM 8.6-50 MG PO TABS
1.0000 | ORAL_TABLET | Freq: Every evening | ORAL | Status: DC | PRN
  Administered 2019-12-20: 1 via ORAL

## 2019-12-19 MED ORDER — APIXABAN 5 MG PO TABS
5.0000 mg | ORAL_TABLET | Freq: Two times a day (BID) | ORAL | Status: DC
  Administered 2019-12-19 – 2019-12-23 (×8): 5 mg via ORAL
  Filled 2019-12-19 (×10): qty 1

## 2019-12-19 MED ORDER — ONDANSETRON HCL 4 MG PO TABS: 4.0000 mg | ORAL_TABLET | Freq: Four times a day (QID) | ORAL | Status: DC | PRN

## 2019-12-19 MED ORDER — ROSUVASTATIN CALCIUM 20 MG PO TABS
40.0000 mg | ORAL_TABLET | Freq: Every day | ORAL | Status: DC
  Administered 2019-12-20 – 2019-12-23 (×4): 40 mg via ORAL
  Filled 2019-12-19 (×4): qty 2

## 2019-12-19 MED ORDER — AMIODARONE HCL 200 MG PO TABS
200.0000 mg | ORAL_TABLET | Freq: Every day | ORAL | Status: DC
  Administered 2019-12-20 – 2019-12-23 (×4): 200 mg via ORAL
  Filled 2019-12-19 (×4): qty 1

## 2019-12-19 MED ORDER — ACETAMINOPHEN 500 MG PO TABS
1000.0000 mg | ORAL_TABLET | Freq: Four times a day (QID) | ORAL | Status: DC | PRN
  Administered 2019-12-19 – 2019-12-23 (×10): 1000 mg via ORAL
  Filled 2019-12-19 (×10): qty 2

## 2019-12-19 MED ORDER — SODIUM CHLORIDE 0.9 % IV BOLUS
500.0000 mL | Freq: Once | INTRAVENOUS | Status: AC
  Administered 2019-12-19: 500 mL via INTRAVENOUS

## 2019-12-19 MED ORDER — ACETAMINOPHEN 500 MG PO TABS
1000.0000 mg | ORAL_TABLET | Freq: Once | ORAL | Status: AC
  Administered 2019-12-19: 1000 mg via ORAL
  Filled 2019-12-19: qty 2

## 2019-12-19 MED ORDER — PANTOPRAZOLE SODIUM 40 MG PO TBEC
40.0000 mg | DELAYED_RELEASE_TABLET | Freq: Every day | ORAL | Status: DC
  Administered 2019-12-20 – 2019-12-23 (×4): 40 mg via ORAL
  Filled 2019-12-19 (×4): qty 1

## 2019-12-19 MED ORDER — LORATADINE 10 MG PO TABS: 10.0000 mg | ORAL_TABLET | Freq: Every day | ORAL | Status: DC | PRN

## 2019-12-19 MED ORDER — SODIUM CHLORIDE 0.9 % IV SOLN: INTRAVENOUS | Status: DC

## 2019-12-19 MED ORDER — ONDANSETRON HCL 4 MG/2ML IJ SOLN: 4.0000 mg | Freq: Four times a day (QID) | INTRAMUSCULAR | Status: DC | PRN

## 2019-12-19 NOTE — Chronic Care Management (AMB) (Signed)
Chronic Care Management   Follow Up Note   12/19/2019 Name: Carl [name] MRN: [id number] DOB: [birth year]  Referred by: [name], MD Reason for referral : Chronic Care Management (Medication Management)   Carl [name] is a 84 y.o. year old male who is a primary care patient of [name], [name], MD. The CCM team was consulted for assistance with chronic disease management and care coordination needs.    Contacted patient's daughter for medication management review.   Review of patient status, including review of consultants reports, relevant laboratory and other test results, and collaboration with appropriate care team members and the patient's provider was performed as part of comprehensive patient evaluation and provision of chronic care management services.    SDOH (Social Determinants of Health) assessments performed: No See Care Plan activities for detailed interventions related to [name])     Facility-Administered Encounter Medications as of 12/19/2019  Medication  . 0.9 %  sodium chloride infusion  . acetaminophen (TYLENOL) tablet 1,000 mg  . amiodarone (PACERONE) tablet 200 mg  . apixaban (ELIQUIS) tablet 5 mg  . loratadine (CLARITIN) tablet 10 mg  . ondansetron (ZOFRAN) tablet 4 mg   Or  . ondansetron (ZOFRAN) injection 4 mg  . pantoprazole (PROTONIX) EC tablet 40 mg  . rosuvastatin (CRESTOR) tablet 40 mg  . senna-docusate (Senokot-S) tablet 1 tablet  . tamsulosin (FLOMAX) capsule 0.4 mg   Outpatient Encounter Medications as of 12/19/2019  Medication Sig  . acetaminophen (TYLENOL) 500 MG tablet Take 1,000 mg by mouth every 6 (six) hours as needed for mild pain (for pain).   [name] ALPRAZolam (XANAX) 0.25 MG tablet Take 1 tablet (0.25 mg total) by mouth 2 (two) times daily as needed for anxiety. (Patient not taking: Reported on 12/19/2019)  . amiodarone (PACERONE) 200 MG tablet Take 1 tablet (200 mg total) by mouth daily. Needs office visit for further  refills. 3RD and final attempt  . apixaban (ELIQUIS) 5 MG TABS tablet Take 1 tablet (5 mg total) by mouth 2 (two) times daily.  [name] loratadine (CLARITIN) 10 MG tablet Take 1 tablet (10 mg total) by mouth daily. (Patient taking differently: Take 10 mg by mouth daily as needed for allergies. )  . omeprazole (PRILOSEC) 40 MG capsule TAKE 1 CAPSULE BY MOUTH EVERY DAY  . rosuvastatin (CRESTOR) 40 MG tablet Take 1 tablet (40 mg total) by mouth daily.  . tamsulosin (FLOMAX) 0.4 MG CAPS capsule Take 1 capsule (0.4 mg total) by mouth daily after supper.     Objective:   Goals Addressed            This Visit's Progress     Patient Stated   . "I'm worried about his health declining (pt-stated)       [place] (see longtitudinal plan of care for additional care plan information)  Current Barriers:  Financial Barriers: Eliquis assistance ran out 07/27/19.  o Patient was put in touch with [place] [facility], thought they had applied for Extra Help on 09/02/19 - however, [name] notes today that they have not heard anything from [facility].  o Tier Exception Request denied for Eliquis o Reapplied for Extra Help 10/31/19  o Patient in ED right now for weakness, dehydration, UTI. Per ED notes, Hospice service ended as patient was receiving tx for prostate cancer . Polypharmacy, complicated patient with multiple chronic conditions including atrial fibrillation, CKD, ASCVD (hx CABG), hx prostate cancer, lung nodule - Connected w/ Palliative Care, previously on Hospice o  Atrial fibrillation: Eliquis 5 mg BID, amiodarone 200 mg daily o ASCVD risk reduction: rosuvastatin 40 mg daily; LDL at goal <70 o GERD: omeprazole 40 mg daily o Recurrent kidney stones: tamsulosin 0.4 mg daily;  o Anxiety/insomnia: alprazolam 0.25 mg QPM PRN sleep   Pharmacist Clinical Goal(s):  [name] Over the next 90 days, patient will work with PharmD and providers for optimized medication management  Interventions: . Contacted [name]. She has not  heard a decision from [facility] regarding Extra Help yet.  . Will continue to follow hospital course  Patient Self Care Activities:  . Patient will take medications as prescribed  Please see past updates related to this goal by clicking on the "Past Updates" button in the selected goal          Plan:  - Will follow for discharge plan  [name], PharmD, [place], [place] Pharmacist [place] [phone]

## 2019-12-19 NOTE — H&P (Signed)
History and Physical    Carl [name] [id number] DOB: 1936/02/07 DOA: 12/19/2019  PCP: [name], MD (Confirm with patient/family/NH records and if not entered, this has to be entered at [facility] point of entry) Patient coming from: Home  I have personally briefly reviewed patient's old medical records in [place]  Chief Complaint: Feel weak  HPI: Carl [name] is a 84 y.o. male with medical history significant of metastatic prostate cancer on hormone therapy, paroxysmal A. fib on Eliquis, CKD stage II, chronic anemia, CAD status post CABG, presented with increasing weakness, urinary frequency and frequent falls.  Patient has chronic ambulation dysfunction usually uses a walker to ambulate.  3 days ago patient started to have urinary symptoms with frequent urinations but no dysuria or hematuria.  Denies any fever chills.  Over the last 2 days he felt his appetite significant drop and very dehydrated.  From last night he started to feel lightheadedness and blurry vision every time he tried to stand up or move around.  He had 3 episodes of fall but denied any loss of consciousness or head injuries. He landed on left elbow and right hip where he had a total hip earlier this year. And he felt pain on left elbow and right hip after those falls. ED Course: CT head and neck, x-ray of left elbow and pelvis reassuring. UA showed signs of UTI.  Review of Systems: As per HPI otherwise 10 point review of systems negative.   Past Medical History:  Diagnosis Date  . (HFpEF) heart failure with preserved ejection fraction ([place])    a. 08/2016 Echo: EF 50-55%.  [name] AAA (abdominal aortic aneurysm) ([place]) 06/05/2019   abdominal aortic aneurysm measuring 3.1 cm.  . Arthritis    arms  . Bladder calculus   . Chronic anemia   . Chronic anxiety   . CKD (chronic kidney disease), stage III   . Coronary artery disease CARDIOLOGIST-  DR Rockey Situ   a. CABG 2006 with  LIMA-LAD, SVG-Ramus, SVG-OM,  seq SVG-PLV-PDA.  . DOE (dyspnea on exertion)   . GERD (gastroesophageal reflux disease)   . Heart murmur   . Hematuria   . Hip fracture ([place]) 10/05/2018  . History of bladder stone   . History of colon polyps   . History of non-ST elevation myocardial infarction (NSTEMI) 03/30/2005   post op recovery inguinal hernia repair  . History of pressure ulcer   . History of recurrent UTIs    last admission 08-04-2018 pseudomonas UTI  . Hyperlipidemia   . Hyperplasia of prostate without lower urinary tract symptoms (LUTS)   . Hypertension   . Indwelling Foley catheter present   . Intracranial hematoma ([place]) 10/05/2018  . Ischemic cardiomyopathy    a. echo (01/13) 45-50% septal akinesis, mild MR; b. Echo  (01/16) ef 60-65%, mid-apical anteroseptal hypokinesis; c. echo 09-16-2016 ef 50-55%  . Left ureteral stone   . Lung nodule    noted 08/ 2018;  followed by pcp  . LVH (left ventricular hypertrophy) 09/16/2016   Moderate noted on ECHO  . Moderate mitral regurgitation    a.08/2016 Echo: Mod MR/TR.  [name] Orthostatic hypotension   . PAC (premature atrial contraction)   . Paroxysmal atrial flutter ([place])    a. dx 08/2016, started on amiodarone.  . Persistent atrial fibrillation [facility]) followed by cardiologist-- dr Rockey Situ   a. dx AFib 08/2016 s/p DCCV 10/2016-->eliquis (CHA2DS2VASc = 5).  . Pneumonia   . Pulmonary hypertension ([place])  last echo in epic 09-16-2016  . Recurrent prostate cancer ([place]) UROLOGIST-- DR BELL   s/p  radioactive prostate seed implants in 1997;    due to PSA rising pt has been doing lupron injection's few past several yrs at dr bell office  . Renal calculi    bilateral per CT 06-18-2018  . S/P CABG x 5 04/04/2005   LIMA to LAD,  SVG to Ramus,  SVG to OM,  seqSVG to PLV and PDA  . Wears glasses     Past Surgical History:  Procedure Laterality Date  . CARDIAC CATHETERIZATION  04/02/2005   dr Doreatha Lew    Critical three-vessel coronary disease --  Essentially normal left  ventricular function , ef 55%  . CARDIOVASCULAR STRESS TEST  05-01-2008  dr Doreatha Lew /  dr harding   Low risk nuclear study w/ no ischemia or scar but flattened septal motion/  ef 64%  . CARDIOVERSION N/A 11/19/2016   Procedure: CARDIOVERSION;  Surgeon: Jerline Pain, MD;  Location: [place];  Service: Cardiovascular;  Laterality: N/A;  . COLONOSCOPY N/A 10/23/2016   Procedure: COLONOSCOPY;  Surgeon: Milus Banister, MD;  Location: [facility];  Service: Endoscopy;  Laterality: N/A;  . CORONARY ARTERY BYPASS GRAFT  04/04/2005   dr  Ricard Dillon   LIMA - LAD,  SVG - Ramus, SVG - OM,  seqSVG - PLV and PDA  . CYSTOSCOPY WITH LITHOLAPAXY N/A 05/07/2016   Procedure: CYSTOSCOPY WITH LITHOLAPAXY;  Surgeon: Rana Snare, MD;  Location: [facility];  Service: Urology;  Laterality: N/A;  . CYSTOSCOPY WITH RETROGRADE PYELOGRAM, URETEROSCOPY AND STENT PLACEMENT  09/03/2018   Procedure: CYSTOSCOPY WITH RETROGRADE PYELOGRAM, URETEROSCOPY LASER LITHOTRIPSY AND STENT PLACEMENT;  Surgeon: Lucas Mallow, MD;  Location: [facility];  Service: Urology;;  . CYSTOSCOPY/URETEROSCOPY/HOLMIUM LASER/STENT PLACEMENT Right 06/15/2019   Procedure: CYSTOSCOPY RIGHT URETEROSCOPY HOLMIUM LASER STENT PLACEMENT;  Surgeon: Lucas Mallow, MD;  Location: [facility];  Service: Urology;  Laterality: Right;  . HOLMIUM LASER APPLICATION N/A [id number]   Procedure: HOLMIUM LASER APPLICATION;  Surgeon: Rana Snare, MD;  Location: [facility];  Service: Urology;  Laterality: N/A;  . INGUINAL HERNIA REPAIR Left 03-30-2005  dr Excell Seltzer   incarcerated   . RADIOACTIVE PROSTATE SEED IMPLANTS  1997  . TOTAL HIP ARTHROPLASTY Right 10/07/2018   Procedure: TOTAL HIP ARTHROPLASTY ANTERIOR APPROACH;  Surgeon: Rod Can, MD;  Location: [facility];  Service: Orthopedics;  Laterality: Right;  . TRANSTHORACIC ECHOCARDIOGRAM  08/24/2014   mid anteroseptal and distal septak hypokinetic,  mild focal basal LVH, ef 60-65%,  grade  1 diastolic dysfunction/  mild MR/  mild LAE/  mild dilated RV with normal RVSP/  trivial TR     reports that he quit smoking about 59 years ago. His smoking use included cigarettes. He quit after 2.00 years of use. He has never used smokeless tobacco. He reports that he does not drink alcohol or use drugs.  Allergies  Allergen Reactions  . Ciprofloxacin Other (See Comments)    PER HEART DR  . Ibuprofen Other (See Comments)    Kidney issues    Family History  Problem Relation Age of Onset  . Heart disease Mother   . Cancer Mother   . Cancer Father   . Cancer Brother      Prior to Admission medications   Medication Sig Start Date End Date Taking? Authorizing Provider  acetaminophen (TYLENOL) 500 MG tablet Take 1,000 mg by mouth every 6 (  six) hours as needed for mild pain (for pain).    Yes [provider]  amiodarone (PACERONE) 200 MG tablet Take 1 tablet (200 mg total) by mouth daily. Needs office visit for further refills. 3RD and final attempt 11/29/19  Yes Gollan, Kathlene November, MD  apixaban (ELIQUIS) 5 MG TABS tablet Take 1 tablet (5 mg total) by mouth 2 (two) times daily. 12/16/18  Yes [name], NP  loratadine (CLARITIN) 10 MG tablet Take 1 tablet (10 mg total) by mouth daily. Patient taking differently: Take 10 mg by mouth daily as needed for allergies.  08/30/19  Yes [name], MD  omeprazole (PRILOSEC) 40 MG capsule TAKE 1 CAPSULE BY MOUTH EVERY DAY 08/04/19  Yes [name], MD  rosuvastatin (CRESTOR) 40 MG tablet Take 1 tablet (40 mg total) by mouth daily. 08/19/19  Yes [name], MD  tamsulosin (FLOMAX) 0.4 MG CAPS capsule Take 1 capsule (0.4 mg total) by mouth daily after supper. 01/06/18  Yes [name], MD  ALPRAZolam Duanne Moron) 0.25 MG tablet Take 1 tablet (0.25 mg total) by mouth 2 (two) times daily as needed for anxiety. Patient not taking: Reported on 12/19/2019 10/21/19   [name], MD    Physical Exam: Vitals:    12/19/19 1215 12/19/19 1230 12/19/19 1245 12/19/19 1300  BP: 140/86 138/81 132/74 118/67  Pulse:      Resp:      Temp:      TempSrc:      SpO2:      Weight:      Height:        Constitutional: NAD, calm, comfortable Vitals:   12/19/19 1215 12/19/19 1230 12/19/19 1245 12/19/19 1300  BP: 140/86 138/81 132/74 118/67  Pulse:      Resp:      Temp:      TempSrc:      SpO2:      Weight:      Height:       Eyes: PERRL, lids and conjunctivae normal ENMT: Mucous membranes are dry. Posterior pharynx clear of any exudate or lesions.Normal dentition.  Neck: normal, supple, no masses, no thyromegaly Respiratory: clear to auscultation bilaterally, no wheezing, no crackles. Normal respiratory effort. No accessory muscle use.  Cardiovascular: Regular rate and rhythm, no murmurs / rubs / gallops. No extremity edema. 2+ pedal pulses. No carotid bruits.  Abdomen: no tenderness, no masses palpated. No hepatosplenomegaly. Bowel sounds positive.  Musculoskeletal: no clubbing / cyanosis. No joint deformity upper and lower extremities. Good ROM, no contractures. Normal muscle tone.  Skin: no rashes, lesions, ulcers. No induration Neurologic: CN 2-12 grossly intact. Sensation intact, DTR normal. Strength 5/5 in all 4.  Psychiatric: Normal judgment and insight. Alert and oriented x 3. Normal mood.    Labs on Admission: I have personally reviewed following labs and imaging studies  CBC: Recent Labs  Lab 12/19/19 0804  WBC 6.5  NEUTROABS 5.6  HGB 10.3*  HCT 33.1*  MCV 99.1  PLT [id number]*   Basic Metabolic Panel: Recent Labs  Lab 12/19/19 0804  NA 140  K 4.2  CL 106  CO2 24  GLUCOSE 132*  BUN 20  CREATININE 1.30*  CALCIUM 8.8*   GFR: Estimated Creatinine Clearance: 40.5 mL/min (A) (by C-G formula based on SCr of 1.3 mg/dL (H)). Liver Function Tests: Recent Labs  Lab 12/19/19 0804  AST 24  ALT 31  ALKPHOS 61  BILITOT 0.6  PROT 6.1*  ALBUMIN 3.1*  No results for input(s):  LIPASE, AMYLASE in the last 168 hours. No results for input(s): AMMONIA in the last 168 hours. Coagulation Profile: Recent Labs  Lab 12/19/19 0804  INR 1.4*   Cardiac Enzymes: Recent Labs  Lab 12/19/19 0804  CKTOTAL 201   BNP (last 3 results) No results for input(s): PROBNP in the last 8760 hours. HbA1C: No results for input(s): HGBA1C in the last 72 hours. CBG: No results for input(s): GLUCAP in the last 168 hours. Lipid Profile: No results for input(s): CHOL, HDL, LDLCALC, TRIG, CHOLHDL, LDLDIRECT in the last 72 hours. Thyroid Function Tests: No results for input(s): TSH, T4TOTAL, FREET4, T3FREE, THYROIDAB in the last 72 hours. Anemia Panel: No results for input(s): VITAMINB12, FOLATE, FERRITIN, TIBC, IRON, RETICCTPCT in the last 72 hours. Urine analysis:    Component Value Date/Time   COLORURINE YELLOW 12/19/2019 0950   APPEARANCEUR HAZY (A) 12/19/2019 0950   LABSPEC 1.017 12/19/2019 0950   PHURINE 5.0 12/19/2019 0950   GLUCOSEU NEGATIVE 12/19/2019 0950   HGBUR LARGE (A) 12/19/2019 0950   BILIRUBINUR NEGATIVE 12/19/2019 0950   BILIRUBINUR mod 05/11/2019 0928   KETONESUR NEGATIVE 12/19/2019 0950   PROTEINUR 30 (A) 12/19/2019 0950   UROBILINOGEN 0.2 05/11/2019 0928   NITRITE POSITIVE (A) 12/19/2019 0950   LEUKOCYTESUR LARGE (A) 12/19/2019 0950    Radiological Exams on Admission: DG Chest 2 View  Result Date: 12/19/2019 CLINICAL DATA:  Cough.  Recent fall EXAM: CHEST - 2 VIEW COMPARISON:  March 08, 2019 FINDINGS: Lungs are clear. Heart size and pulmonary vascularity are normal. No adenopathy. Patient is status post coronary artery bypass grafting. There is aortic atherosclerosis. Bones are osteoporotic. There old healed fractures of several ribs on the right. No pneumothorax. IMPRESSION: Lungs clear. Stable cardiac silhouette. Postoperative changes. Bones osteoporotic. No pneumothorax. Aortic Atherosclerosis (ICD10-I70.0). Electronically Signed   By: Lowella Grip  III M.D.   On: 12/19/2019 08:01   DG Elbow Complete Left  Result Date: 12/19/2019 CLINICAL DATA:  Pain following fall EXAM: LEFT ELBOW - COMPLETE 3+ VIEW COMPARISON:  None. FINDINGS: Frontal, lateral, and bilateral oblique views were obtained. Bones are osteoporotic. There is no appreciable fracture or dislocation. There is no appreciable joint space narrowing. There is chondrocalcinosis. No erosive change. IMPRESSION: Osteoporosis. No fracture or dislocation. No significant joint space narrowing. Chondrocalcinosis is present, a finding that may be seen with osteoarthritis or with calcium pyrophosphate deposition disease. Electronically Signed   By: Lowella Grip III M.D.   On: 12/19/2019 08:02   CT Head Wo Contrast  Result Date: 12/19/2019 CLINICAL DATA:  Fall. Dizziness. EXAM: CT HEAD WITHOUT CONTRAST CT CERVICAL SPINE WITHOUT CONTRAST TECHNIQUE: Multidetector CT imaging of the head and cervical spine was performed following the standard protocol without intravenous contrast. Multiplanar CT image reconstructions of the cervical spine were also generated. COMPARISON:  02/25/2019. FINDINGS: CT HEAD FINDINGS Brain: No evidence of acute infarction, hemorrhage, hydrocephalus, extra-axial collection or mass lesion/mass effect. There is mild diffuse low-attenuation within the subcortical and periventricular white matter compatible with chronic microvascular disease. Prominence of the sulci and ventricles compatible with brain atrophy. Unchanged left frontal region extra-axial mass containing calcifications measuring 2.5 x 0.9 cm and compatible with a meningioma. Vascular: No hyperdense vessel or unexpected calcification. Skull: Normal. Negative for fracture or focal lesion. Sinuses/Orbits: Opacification of the left anterior ethmoid air cells and frontal sinus identified. Other: None. CT CERVICAL SPINE FINDINGS Alignment: Normal. Skull base and vertebrae: No acute fracture. No primary bone lesion or focal  pathologic process. Soft tissues and spinal canal: No prevertebral fluid or swelling. No visible canal hematoma. Disc levels: Multi level disc space narrowing and endplate spurring identified. This is most advanced at C6-7. Upper chest: Negative Other: None IMPRESSION: 1. No acute intracranial abnormalities. 2. Chronic small vessel ischemic change and brain atrophy. 3. Stable left frontal region meningioma. 4. No evidence for cervical spine fracture. 5. Cervical spondylosis. Electronically Signed   By: Kerby Moors M.D.   On: 12/19/2019 08:46   CT Cervical Spine Wo Contrast  Result Date: 12/19/2019 CLINICAL DATA:  Fall. Dizziness. EXAM: CT HEAD WITHOUT CONTRAST CT CERVICAL SPINE WITHOUT CONTRAST TECHNIQUE: Multidetector CT imaging of the head and cervical spine was performed following the standard protocol without intravenous contrast. Multiplanar CT image reconstructions of the cervical spine were also generated. COMPARISON:  02/25/2019. FINDINGS: CT HEAD FINDINGS Brain: No evidence of acute infarction, hemorrhage, hydrocephalus, extra-axial collection or mass lesion/mass effect. There is mild diffuse low-attenuation within the subcortical and periventricular white matter compatible with chronic microvascular disease. Prominence of the sulci and ventricles compatible with brain atrophy. Unchanged left frontal region extra-axial mass containing calcifications measuring 2.5 x 0.9 cm and compatible with a meningioma. Vascular: No hyperdense vessel or unexpected calcification. Skull: Normal. Negative for fracture or focal lesion. Sinuses/Orbits: Opacification of the left anterior ethmoid air cells and frontal sinus identified. Other: None. CT CERVICAL SPINE FINDINGS Alignment: Normal. Skull base and vertebrae: No acute fracture. No primary bone lesion or focal pathologic process. Soft tissues and spinal canal: No prevertebral fluid or swelling. No visible canal hematoma. Disc levels: Multi level disc space  narrowing and endplate spurring identified. This is most advanced at C6-7. Upper chest: Negative Other: None IMPRESSION: 1. No acute intracranial abnormalities. 2. Chronic small vessel ischemic change and brain atrophy. 3. Stable left frontal region meningioma. 4. No evidence for cervical spine fracture. 5. Cervical spondylosis. Electronically Signed   By: Kerby Moors M.D.   On: 12/19/2019 08:46   DG Hips Bilat W or Wo Pelvis 3-4 Views  Result Date: 12/19/2019 CLINICAL DATA:  Pain following fall EXAM: DG HIP (WITH OR WITHOUT PELVIS) 3-4V BILAT COMPARISON:  March 02, 2019 FINDINGS: Frontal pelvis as well as frontal and lateral views of each hip joint obtained. There is a total hip replacement on the right with prosthetic components well seated. There is underlying osteoporosis. No acute fracture or dislocation. There is mild narrowing of the left hip joint. There is a suspected bone island in the supra-acetabular region on the right. Sclerosis is noted in the intertrochanteric region of the proximal left femur, stable. Seed implants are noted in the prostate. IMPRESSION: No acute fracture or dislocation. Total hip replacement on the right with prosthetic components well-seated. Underlying osteoporosis. Probable bone island in the supra-acetabular region on the right, a stable finding. Sclerotic area in the intertrochanteric region on the left, stable. This area has a chondroid type matrix and may represent an enchondroma or bone infarct. Neoplastic focus felt to be less likely. Seed implants noted in prostate. Electronically Signed   By: Lowella Grip III M.D.   On: 12/19/2019 08:05    EKG: Independently reviewed. NSR  Assessment/Plan Active Problems:   Fall at home, initial encounter   Fall  Fall with orthostatic hypotension -Hypovolemia from UTI and poor oral intake, IV hydration for today  UTI -Pseudomonas UTI and fungal UTI 1 year ago, overall do not see significant increased risk of  resistant organism -Ceftriaxone for now.  Metastatic prostate  cancer s/p brachytherapy -on going hormone manipulation Lupron Q4-6 month, according to patient and his daughter, PSA has remained stable -Suspect patient may have BPH, check PVR, may start Flomax.  chronic CKD II -BUN/creatinine slightly elevated -Correct dehydration  Chronic normocytic anemia -H&H stable, check iron study -Outpatient PCP follow-up  Paroxysmal atrial fibrillation -In sinus rhythm, on amiodarone -Fall is related to isolated UTI and 2nd dehydration, no overall increased fall risk, continue anticoagulation with Eliquis.  Osteoporosis -Show on the pelvic x-ray, check vitamin D level  Elevated Glucose -No Hx of DM -Repeat BMP in AM  HLD -Rosuvastatin 40mg  PO qhs  Debility/generalized weakness -PT and ambulate patient.  DVT prophylaxis: Eliquis Code Status: Full code Family Communication: Daughter at bedside Disposition Plan: Barrier to discharge, likely can be discharged within 24 hours after hydration and antibiotic treatment as well as PT evaluation Consults called: None Admission status: MedSurg observation   Lequita Halt MD [facility] Pager [phone]   12/19/2019, 1:43 PM

## 2019-12-19 NOTE — ED Notes (Signed)
Pt's VS orthostatic, see chart. Attempted to walk, only 67ft tolerated before pt felt weak

## 2019-12-19 NOTE — ED Notes (Signed)
Attempted report to 5N x 1

## 2019-12-19 NOTE — ED Notes (Signed)
Pt taken to CT and xray

## 2019-12-19 NOTE — ED Notes (Signed)
Pt aware of need for urine sample. Stated he could not provide sample at this time.

## 2019-12-19 NOTE — Patient Instructions (Signed)
Visit Information  Goals Addressed            This Visit's Progress     Patient Stated   . "I'm worried about his health declining (pt-stated)       [place] (see longtitudinal plan of care for additional care plan information)  Current Barriers:  Financial Barriers: Eliquis assistance ran out 07/27/19.  o Patient was put in touch with [place] [facility], thought they had applied for Extra Help on 09/02/19 - however, [name] notes today that they have not heard anything from [facility].  o Tier Exception Request denied for Eliquis o Reapplied for Extra Help 10/31/19  o Patient in ED right now for weakness, dehydration, UTI. Per ED notes, Hospice service ended as patient was receiving tx for prostate cancer . Polypharmacy, complicated patient with multiple chronic conditions including atrial fibrillation, CKD, ASCVD (hx CABG), hx prostate cancer, lung nodule - Connected w/ Palliative Care, previously on Hospice o Atrial fibrillation: Eliquis 5 mg BID, amiodarone 200 mg daily o ASCVD risk reduction: rosuvastatin 40 mg daily; LDL at goal <70 o GERD: omeprazole 40 mg daily o Recurrent kidney stones: tamsulosin 0.4 mg daily;  o Anxiety/insomnia: alprazolam 0.25 mg QPM PRN sleep   Pharmacist Clinical Goal(s):  [name] Over the next 90 days, patient will work with PharmD and providers for optimized medication management  Interventions: . Contacted [name]. She has not heard a decision from [facility] regarding Extra Help yet.  . Will continue to follow hospital course  Patient Self Care Activities:  . Patient will take medications as prescribed  Please see past updates related to this goal by clicking on the "Past Updates" button in the selected goal         Patient verbalizes understanding of instructions provided today.   Plan:  - Will follow for discharge plan  [name], PharmD, [place], [place] Pharmacist [place] [phone]

## 2019-12-19 NOTE — Progress Notes (Signed)
Pt arrived to unit accompanied by ED-NT in stretcher, alert/oriented in no acute distress. Situated/orientated to room/equipments. Welcome pack,guide/menu provided. No  Complaints.Informed pt that facility is not responsible for any losses/damages to personal belongings/valuables. Pt verbalilzed understanding of instructions.  3side rails up, call bell/room phone within reach,bed in lowest position and all wheels locked.

## 2019-12-19 NOTE — ED Provider Notes (Signed)
[facility] EMERGENCY DEPARTMENT Provider Note   CSN: [id number] Arrival date & time: 12/19/19  0630     History Chief Complaint  Patient presents with  . Near Syncope  . Fall    Carl [name] is a 84 y.o. male.  HPI      Carl [name] is a 84 y.o. male, with a history of heart failure, AAA, CKD stage III, chronic anemia, LVH, CABG, a flutter, presenting to the ED with generalized weakness and intermittent lightheadedness over the last 2 to 3 days. He states he has not been eating or drinking well stating, "I just do not feel like eating or drinking." He has sustained 2-3 falls over the last 2 days.  The most recent fall was around 2 AM this morning.  He lives with his daughter and grandson.  He called out for them and they helped him off the floor.  He is supposed to be using a walker, however, was not using his walker at the time.  Complains of pain and wounds to his left elbow.  Also complains of bilateral buttock pain. He does note that he has painful wounds on his buttocks, but states those have been there for "a long time," but is unable to more accurately tell me when this occurred.  He does state it is from a previous fall where he had to scoot across the floor because he was unable to get off the ground.  He has not been vaccinated against COVID-19.  Anticoagulated on Eliquis.  Denies fever/chills, head injury, recent illness, N/V/D, abdominal pain, chest pain, shortness of breath, urinary symptoms, or any other complaints.  Past Medical History:  Diagnosis Date  . (HFpEF) heart failure with preserved ejection fraction ([place])    a. 08/2016 Echo: EF 50-55%.  [name] AAA (abdominal aortic aneurysm) ([place]) 06/05/2019   abdominal aortic aneurysm measuring 3.1 cm.  . Arthritis    arms  . Bladder calculus   . Chronic anemia   . Chronic anxiety   . CKD (chronic kidney disease), stage III   . Coronary artery disease CARDIOLOGIST-  DR Rockey Situ   a. CABG  2006 with  LIMA-LAD, SVG-Ramus, SVG-OM, seq SVG-PLV-PDA.  . DOE (dyspnea on exertion)   . GERD (gastroesophageal reflux disease)   . Heart murmur   . Hematuria   . Hip fracture ([place]) 10/05/2018  . History of bladder stone   . History of colon polyps   . History of non-ST elevation myocardial infarction (NSTEMI) 03/30/2005   post op recovery inguinal hernia repair  . History of pressure ulcer   . History of recurrent UTIs    last admission 08-04-2018 pseudomonas UTI  . Hyperlipidemia   . Hyperplasia of prostate without lower urinary tract symptoms (LUTS)   . Hypertension   . Indwelling Foley catheter present   . Intracranial hematoma ([place]) 10/05/2018  . Ischemic cardiomyopathy    a. echo (01/13) 45-50% septal akinesis, mild MR; b. Echo  (01/16) ef 60-65%, mid-apical anteroseptal hypokinesis; c. echo 09-16-2016 ef 50-55%  . Left ureteral stone   . Lung nodule    noted 08/ 2018;  followed by pcp  . LVH (left ventricular hypertrophy) 09/16/2016   Moderate noted on ECHO  . Moderate mitral regurgitation    a.08/2016 Echo: Mod MR/TR.  [name] Orthostatic hypotension   . PAC (premature atrial contraction)   . Paroxysmal atrial flutter ([place])    a. dx 08/2016, started on amiodarone.  . Persistent atrial fibrillation (  [facility]) followed by cardiologist-- dr Rockey Situ   a. dx AFib 08/2016 s/p DCCV 10/2016-->eliquis (CHA2DS2VASc = 5).  . Pneumonia   . Pulmonary hypertension ([place])    last echo in epic 09-16-2016  . Recurrent prostate cancer ([place]) UROLOGIST-- DR BELL   s/p  radioactive prostate seed implants in 1997;    due to PSA rising pt has been doing lupron injection's few past several yrs at dr bell office  . Renal calculi    bilateral per CT 06-18-2018  . S/P CABG x 5 04/04/2005   LIMA to LAD,  SVG to Ramus,  SVG to OM,  seqSVG to PLV and PDA  . Wears glasses     Patient Active Problem List   Diagnosis Date Noted  . Fall 12/19/2019  . Weight loss 08/30/2019  . Onychomycosis 08/30/2019  . AAA  (abdominal aortic aneurysm) ([place]) 08/26/2018  . Kidney stones 06/30/2018  . Allergic rhinitis 04/26/2018  . Urolithiasis 01/05/2018  . Frequent urination 01/01/2018  . Colon polyps 09/15/2017  . GERD (gastroesophageal reflux disease) 09/15/2017  . Wrist pain, acute, right 05/25/2017  . Low back pain 03/24/2017  . Lung nodule seen on imaging study: 7 mm nodule left midlung 03/13/2017  . Physical deconditioning 03/13/2017  . Normocytic anemia 03/13/2017  . Fall at home, initial encounter 03/12/2017  . Abdominal soreness 03/12/2017  . Generalized weakness 03/12/2017  . Leukocytosis 03/12/2017  . Hyponatremia 03/12/2017  . Orthostatic hypotension 02/16/2017  . Anticoagulated 09/19/2016  . History of prostate cancer 09/04/2016  . Persistent atrial fibrillation 09/04/2016  . Cardiomyopathy, ischemic 07/16/2012  . Chronic kidney disease (CKD), stage III (moderate) ([place]) 07/16/2012  . Essential hypertension 08/05/2011  . Murmur 08/05/2011  . 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 Dyslipidemia 02/04/2011    Past Surgical History:  Procedure Laterality Date  . CARDIAC CATHETERIZATION  04/02/2005   dr Doreatha Lew    Critical three-vessel coronary disease --  Essentially normal left ventricular function , ef 55%  . CARDIOVASCULAR STRESS TEST  05-01-2008  dr Doreatha Lew /  dr harding   Low risk nuclear study w/ no ischemia or scar but flattened septal motion/  ef 64%  . CARDIOVERSION N/A 11/19/2016   Procedure: CARDIOVERSION;  Surgeon: Jerline Pain, MD;  Location: [facility];  Service: Cardiovascular;  Laterality: N/A;  . COLONOSCOPY N/A 10/23/2016   Procedure: COLONOSCOPY;  Surgeon: Milus Banister, MD;  Location: [facility];  Service: Endoscopy;  Laterality: N/A;  . CORONARY ARTERY BYPASS GRAFT  04/04/2005   dr  Ricard Dillon   LIMA - LAD,  SVG - Ramus, SVG - OM,  seqSVG - PLV and PDA  . CYSTOSCOPY WITH LITHOLAPAXY N/A 05/07/2016   Procedure: CYSTOSCOPY WITH LITHOLAPAXY;  Surgeon: Rana Snare, MD;   Location: [facility];  Service: Urology;  Laterality: N/A;  . CYSTOSCOPY WITH RETROGRADE PYELOGRAM, URETEROSCOPY AND STENT PLACEMENT  09/03/2018   Procedure: CYSTOSCOPY WITH RETROGRADE PYELOGRAM, URETEROSCOPY LASER LITHOTRIPSY AND STENT PLACEMENT;  Surgeon: Lucas Mallow, MD;  Location: [facility];  Service: Urology;;  . CYSTOSCOPY/URETEROSCOPY/HOLMIUM LASER/STENT PLACEMENT Right 06/15/2019   Procedure: CYSTOSCOPY RIGHT URETEROSCOPY HOLMIUM LASER STENT PLACEMENT;  Surgeon: Lucas Mallow, MD;  Location: [facility];  Service: Urology;  Laterality: Right;  . HOLMIUM LASER APPLICATION N/A [id number]   Procedure: HOLMIUM LASER APPLICATION;  Surgeon: Rana Snare, MD;  Location: [facility];  Service: Urology;  Laterality: N/A;  . INGUINAL HERNIA REPAIR Left 03-30-2005  dr Excell Seltzer  incarcerated   . RADIOACTIVE PROSTATE SEED IMPLANTS  1997  . TOTAL HIP ARTHROPLASTY Right 10/07/2018   Procedure: TOTAL HIP ARTHROPLASTY ANTERIOR APPROACH;  Surgeon: Rod Can, MD;  Location: [facility];  Service: Orthopedics;  Laterality: Right;  . TRANSTHORACIC ECHOCARDIOGRAM  08/24/2014   mid anteroseptal and distal septak hypokinetic,  mild focal basal LVH, ef 60-65%,  grade 1 diastolic dysfunction/  mild MR/  mild LAE/  mild dilated RV with normal RVSP/  trivial TR       Family History  Problem Relation Age of Onset  . Heart disease Mother   . Cancer Mother   . Cancer Father   . Cancer Brother     Social History   Tobacco Use  . Smoking status: Former Smoker    Years: 2.00    Types: Cigarettes    Quit date: 07/28/1960    Years since quitting: 59.4  . Smokeless tobacco: Never Used  Substance Use Topics  . Alcohol use: No  . Drug use: No    Home Medications Prior to Admission medications   Medication Sig Start Date End Date Taking? Authorizing Provider  acetaminophen (TYLENOL) 500 MG tablet Take 1,000 mg by mouth every 6 (six) hours as needed for mild pain (for pain).     Yes [provider]  amiodarone (PACERONE) 200 MG tablet Take 1 tablet (200 mg total) by mouth daily. Needs office visit for further refills. 3RD and final attempt 11/29/19  Yes Gollan, Kathlene November, MD  apixaban (ELIQUIS) 5 MG TABS tablet Take 1 tablet (5 mg total) by mouth 2 (two) times daily. 12/16/18  Yes [name], NP  loratadine (CLARITIN) 10 MG tablet Take 1 tablet (10 mg total) by mouth daily. Patient taking differently: Take 10 mg by mouth daily as needed for allergies.  08/30/19  Yes [name], MD  omeprazole (PRILOSEC) 40 MG capsule TAKE 1 CAPSULE BY MOUTH EVERY DAY 08/04/19  Yes [name], MD  rosuvastatin (CRESTOR) 40 MG tablet Take 1 tablet (40 mg total) by mouth daily. 08/19/19  Yes [name], MD  tamsulosin (FLOMAX) 0.4 MG CAPS capsule Take 1 capsule (0.4 mg total) by mouth daily after supper. 01/06/18  Yes [name], MD  ALPRAZolam Duanne Moron) 0.25 MG tablet Take 1 tablet (0.25 mg total) by mouth 2 (two) times daily as needed for anxiety. Patient not taking: Reported on 12/19/2019 10/21/19   [name], MD    Allergies    Ciprofloxacin and Ibuprofen  Review of Systems   Review of Systems  Constitutional: Negative for chills, diaphoresis and fever.  Respiratory: Negative for cough and shortness of breath.   Cardiovascular: Negative for chest pain and leg swelling.  Gastrointestinal: Negative for abdominal pain, diarrhea, nausea and vomiting.  Musculoskeletal: Positive for arthralgias. Negative for back pain.  Skin: Positive for wound.  Neurological: Positive for weakness and light-headedness. Negative for seizures, numbness and headaches.  All other systems reviewed and are negative.   Physical Exam Updated Vital Signs BP 117/76   Pulse 91   Temp 98.2 F (36.8 C) (Oral)   Resp 19   Ht 5\' 10"  (1.778 m)   Wt 65.3 kg   SpO2 99%   BMI 20.66 kg/m   Physical Exam Vitals and nursing note reviewed.  Constitutional:       General: He is not in acute distress.    Appearance: He is well-developed. He is not diaphoretic.  HENT:     Head: Normocephalic  and atraumatic.     Nose: Nose normal.     Mouth/Throat:     Mouth: Mucous membranes are moist.     Pharynx: Oropharynx is clear.  Eyes:     Conjunctiva/sclera: Conjunctivae normal.  Cardiovascular:     Rate and Rhythm: Normal rate and regular rhythm.     Pulses: Normal pulses.          Radial pulses are 2+ on the right side and 2+ on the left side.       Dorsalis pedis pulses are 2+ on the right side and 2+ on the left side.       Posterior tibial pulses are 2+ on the right side and 2+ on the left side.     Heart sounds: Normal heart sounds.     Comments: Tactile temperature in the extremities appropriate and equal bilaterally. Pulmonary:     Effort: Pulmonary effort is normal. No respiratory distress.     Breath sounds: Normal breath sounds.  Abdominal:     Palpations: Abdomen is soft.     Tenderness: There is no abdominal tenderness. There is no guarding.  Musculoskeletal:     Cervical back: Normal range of motion and neck supple.     Right lower leg: No edema.     Left lower leg: No edema.     Comments: Tenderness to the bilateral hips without noted deformity, swelling, color abnormality, or instability. Wounds to the sacral and buttocks region, as shown.  Questionably nonacute.  Pain and tenderness to the left elbow.  He does demonstrate range of motion in the elbow spontaneously.  Abrasions, as shown.  No noted deformity, swelling, or instability. No tenderness, deformity, instability, or pain with range of motion to the left shoulder, wrist, or the rest of the extremities. No midline spinal tenderness.   Overall trauma exam performed without any abnormalities noted other than those mentioned.  Lymphadenopathy:     Cervical: No cervical adenopathy.  Skin:    General: Skin is warm and dry.  Neurological:     Mental Status: He is alert and  oriented to person, place, and time.     Comments: No noted acute cognitive deficit. Sensation grossly intact to light touch in the extremities.   Grip strengths equal bilaterally.   Strength 5/5 in all extremities.  Coordination intact.  Cranial nerves III-XII grossly intact.  Handles oral secretions without noted difficulty.  No noted phonation or speech deficit. No facial droop.   Psychiatric:        Mood and Affect: Mood and affect normal.        Speech: Speech normal.        Behavior: Behavior normal.             ED Results / Procedures / Treatments   Labs (all labs ordered are listed, but only abnormal results are displayed) Labs Reviewed  COMPREHENSIVE METABOLIC PANEL - Abnormal; Notable for the following components:      Result Value   Glucose, Bld 132 (*)    Creatinine, Ser 1.30 (*)    Calcium 8.8 (*)    Total Protein 6.1 (*)    Albumin 3.1 (*)    GFR calc non Af Amer 51 (*)    GFR calc Af Amer 59 (*)    All other components within normal limits  CBC WITH DIFFERENTIAL/PLATELET - Abnormal; Notable for the following components:   RBC 3.34 (*)    Hemoglobin 10.3 (*)    HCT  33.1 (*)    RDW 16.8 (*)    Platelets 145 (*)    Lymphs Abs 0.5 (*)    All other components within normal limits  PROTIME-INR - Abnormal; Notable for the following components:   Prothrombin Time 16.4 (*)    INR 1.4 (*)    All other components within normal limits  URINALYSIS, ROUTINE W REFLEX MICROSCOPIC - Abnormal; Notable for the following components:   APPearance HAZY (*)    Hgb urine dipstick LARGE (*)    Protein, ur 30 (*)    Nitrite POSITIVE (*)    Leukocytes,Ua LARGE (*)    WBC, UA >50 (*)    Bacteria, UA MANY (*)    All other components within normal limits  SARS CORONAVIRUS 2 BY RT PCR (HOSPITAL ORDER, [facility])  URINE CULTURE  CK  VITAMIN D 25 HYDROXY (VIT D DEFICIENCY, FRACTURES)  IRON AND TIBC  FERRITIN    Hemoglobin  Date Value Ref  Range Status  12/19/2019 10.3 (L) 13.0 - 17.0 g/dL Final  09/08/2019 11.0 (L) 13.0 - 17.0 g/dL Final  06/14/2019 10.0 (L) 13.0 - 17.0 g/dL Final  06/05/2019 9.3 (L) 13.0 - 17.0 g/dL Final  02/19/2017 11.3 (L) 13.0 - 17.7 g/dL Final  02/09/2017 11.4 (L) 13.0 - 17.7 g/dL Final    EKG EKG Interpretation  Date/Time:  Monday Dec 19 2019 08:01:02 EDT Ventricular Rate:  75 PR Interval:    QRS Duration: 102 QT Interval:  414 QTC Calculation: 463 R Axis:   34 Text Interpretation: Sinus rhythm Prolonged PR interval Anterior infarct, old No significant change since last tracing Confirmed by Theotis Burrow [phone]) on 12/19/2019 8:13:07 AM   Radiology DG Chest 2 View  Result Date: 12/19/2019 CLINICAL DATA:  Cough.  Recent fall EXAM: CHEST - 2 VIEW COMPARISON:  March 08, 2019 FINDINGS: Lungs are clear. Heart size and pulmonary vascularity are normal. No adenopathy. Patient is status post coronary artery bypass grafting. There is aortic atherosclerosis. Bones are osteoporotic. There old healed fractures of several ribs on the right. No pneumothorax. IMPRESSION: Lungs clear. Stable cardiac silhouette. Postoperative changes. Bones osteoporotic. No pneumothorax. Aortic Atherosclerosis (ICD10-I70.0). Electronically Signed   By: Lowella Grip III M.D.   On: 12/19/2019 08:01   DG Elbow Complete Left  Result Date: 12/19/2019 CLINICAL DATA:  Pain following fall EXAM: LEFT ELBOW - COMPLETE 3+ VIEW COMPARISON:  None. FINDINGS: Frontal, lateral, and bilateral oblique views were obtained. Bones are osteoporotic. There is no appreciable fracture or dislocation. There is no appreciable joint space narrowing. There is chondrocalcinosis. No erosive change. IMPRESSION: Osteoporosis. No fracture or dislocation. No significant joint space narrowing. Chondrocalcinosis is present, a finding that may be seen with osteoarthritis or with calcium pyrophosphate deposition disease. Electronically Signed   By: Lowella Grip III M.D.   On: 12/19/2019 08:02   CT Head Wo Contrast  Result Date: 12/19/2019 CLINICAL DATA:  Fall. Dizziness. EXAM: CT HEAD WITHOUT CONTRAST CT CERVICAL SPINE WITHOUT CONTRAST TECHNIQUE: Multidetector CT imaging of the head and cervical spine was performed following the standard protocol without intravenous contrast. Multiplanar CT image reconstructions of the cervical spine were also generated. COMPARISON:  02/25/2019. FINDINGS: CT HEAD FINDINGS Brain: No evidence of acute infarction, hemorrhage, hydrocephalus, extra-axial collection or mass lesion/mass effect. There is mild diffuse low-attenuation within the subcortical and periventricular white matter compatible with chronic microvascular disease. Prominence of the sulci and ventricles compatible with brain atrophy. Unchanged left frontal region extra-axial  mass containing calcifications measuring 2.5 x 0.9 cm and compatible with a meningioma. Vascular: No hyperdense vessel or unexpected calcification. Skull: Normal. Negative for fracture or focal lesion. Sinuses/Orbits: Opacification of the left anterior ethmoid air cells and frontal sinus identified. Other: None. CT CERVICAL SPINE FINDINGS Alignment: Normal. Skull base and vertebrae: No acute fracture. No primary bone lesion or focal pathologic process. Soft tissues and spinal canal: No prevertebral fluid or swelling. No visible canal hematoma. Disc levels: Multi level disc space narrowing and endplate spurring identified. This is most advanced at C6-7. Upper chest: Negative Other: None IMPRESSION: 1. No acute intracranial abnormalities. 2. Chronic small vessel ischemic change and brain atrophy. 3. Stable left frontal region meningioma. 4. No evidence for cervical spine fracture. 5. Cervical spondylosis. Electronically Signed   By: Kerby Moors M.D.   On: 12/19/2019 08:46   CT Cervical Spine Wo Contrast  Result Date: 12/19/2019 CLINICAL DATA:  Fall. Dizziness. EXAM: CT HEAD WITHOUT CONTRAST  CT CERVICAL SPINE WITHOUT CONTRAST TECHNIQUE: Multidetector CT imaging of the head and cervical spine was performed following the standard protocol without intravenous contrast. Multiplanar CT image reconstructions of the cervical spine were also generated. COMPARISON:  02/25/2019. FINDINGS: CT HEAD FINDINGS Brain: No evidence of acute infarction, hemorrhage, hydrocephalus, extra-axial collection or mass lesion/mass effect. There is mild diffuse low-attenuation within the subcortical and periventricular white matter compatible with chronic microvascular disease. Prominence of the sulci and ventricles compatible with brain atrophy. Unchanged left frontal region extra-axial mass containing calcifications measuring 2.5 x 0.9 cm and compatible with a meningioma. Vascular: No hyperdense vessel or unexpected calcification. Skull: Normal. Negative for fracture or focal lesion. Sinuses/Orbits: Opacification of the left anterior ethmoid air cells and frontal sinus identified. Other: None. CT CERVICAL SPINE FINDINGS Alignment: Normal. Skull base and vertebrae: No acute fracture. No primary bone lesion or focal pathologic process. Soft tissues and spinal canal: No prevertebral fluid or swelling. No visible canal hematoma. Disc levels: Multi level disc space narrowing and endplate spurring identified. This is most advanced at C6-7. Upper chest: Negative Other: None IMPRESSION: 1. No acute intracranial abnormalities. 2. Chronic small vessel ischemic change and brain atrophy. 3. Stable left frontal region meningioma. 4. No evidence for cervical spine fracture. 5. Cervical spondylosis. Electronically Signed   By: Kerby Moors M.D.   On: 12/19/2019 08:46   DG Hips Bilat W or Wo Pelvis 3-4 Views  Result Date: 12/19/2019 CLINICAL DATA:  Pain following fall EXAM: DG HIP (WITH OR WITHOUT PELVIS) 3-4V BILAT COMPARISON:  March 02, 2019 FINDINGS: Frontal pelvis as well as frontal and lateral views of each hip joint obtained. There  is a total hip replacement on the right with prosthetic components well seated. There is underlying osteoporosis. No acute fracture or dislocation. There is mild narrowing of the left hip joint. There is a suspected bone island in the supra-acetabular region on the right. Sclerosis is noted in the intertrochanteric region of the proximal left femur, stable. Seed implants are noted in the prostate. IMPRESSION: No acute fracture or dislocation. Total hip replacement on the right with prosthetic components well-seated. Underlying osteoporosis. Probable bone island in the supra-acetabular region on the right, a stable finding. Sclerotic area in the intertrochanteric region on the left, stable. This area has a chondroid type matrix and may represent an enchondroma or bone infarct. Neoplastic focus felt to be less likely. Seed implants noted in prostate. Electronically Signed   By: Lowella Grip III M.D.   On: 12/19/2019 08:05  Procedures Procedures (including critical care time)  Medications Ordered in ED Medications  acetaminophen (TYLENOL) tablet 1,000 mg (has no administration in time range)  amiodarone (PACERONE) tablet 200 mg (has no administration in time range)  rosuvastatin (CRESTOR) tablet 40 mg (has no administration in time range)  pantoprazole (PROTONIX) EC tablet 40 mg (has no administration in time range)  tamsulosin (FLOMAX) capsule 0.4 mg (has no administration in time range)  apixaban (ELIQUIS) tablet 5 mg (has no administration in time range)  loratadine (CLARITIN) tablet 10 mg (has no administration in time range)  0.9 %  sodium chloride infusion (has no administration in time range)  ondansetron (ZOFRAN) tablet 4 mg (has no administration in time range)    Or  ondansetron (ZOFRAN) injection 4 mg (has no administration in time range)  senna-docusate (Senokot-S) tablet 1 tablet (has no administration in time range)  sodium chloride 0.9 % bolus 500 mL (0 mLs Intravenous Stopped  12/19/19 1308)  acetaminophen (TYLENOL) tablet 1,000 mg (1,000 mg Oral Given 12/19/19 1101)  cefTRIAXone (ROCEPHIN) 1 g in sodium chloride 0.9 % 100 mL IVPB (0 g Intravenous Stopped 12/19/19 1309)    ED Course  I have reviewed the triage vital signs and the nursing notes.  Pertinent labs & imaging results that were available during my care of the patient were reviewed by me and considered in my medical decision making (see chart for details).  Clinical Course as of Dec 19 1422  Mon Dec 19, 2019  [id number] Noted to be 206 in Feb 2021.  Platelets(!): 145 [SJ]  0931 Called patient's daughter, [name], with whom the patient lives. Reached voicemail. Left message with call back number.   [SJ]  [id number] Patient's daughter called back.  States she lives with the patient. He has generalized weakness at baseline. He hasn't been eating or drinking much over the last few days. Has had 4 falls over the last 3 days.  He was on hospice/palliative care, however, was removed due to being placed on medication for prostate cancer; treated through [facility]. If patient is discharged, call and family will come pick him up.   [SJ]  [place] with Webb Laws, Brooke Bonito., patient's son. We discussed the patient's presentation and goals for care.   [SJ]  [place] with Dr. Roosevelt Locks, hospitalist.  Agrees to admit the patient.   [SJ]  77 Spoke with [name] (daughter) again to update her on admission.    [SJ]    Clinical Course User Index [SJ] Phoenix Dresser, Helane Gunther, PA-C   MDM Rules/Calculators/A&P                      Patient presents with generalized weakness and intermittent lightheadedness, especially with standing from a sitting position. Patient is nontoxic appearing, afebrile, not tachycardic, not tachypneic, not hypotensive, maintains excellent SPO2 on room air, and is in no apparent distress.   I have reviewed the patient's chart to obtain more information.   I reviewed and interpreted the patient's labs and  radiological studies. No acute abnormalities noted on imaging studies. Mild increase in creatinine.  No leukocytosis.  RN ambulated patient, who felt generally weak, but complained of no pain in the hips, pelvis, legs, or elsewhere.   He was orthostatic and this was addressed with IV fluids.  He does have evidence of UTI on his UA.   I suspect patient would benefit from admission, even observation admission, for generalized weakness in the setting of UTI.  Patient states  he feels unsafe to go home due to his acute weakness.   Findings and plan of care discussed with Theotis Burrow, MD. Dr. Rex Kras personally evaluated and examined this patient.   Vitals:   12/19/19 0700 12/19/19 0715 12/19/19 0915 12/19/19 0930  BP: 136/75 113/75  (!) 90/55  Pulse:  73 72 (!) 101  Resp: 18 15 14  (!) 24  Temp:      TempSrc:      SpO2:  99% 98% 98%  Weight:      Height:       Vitals:   12/19/19 1215 12/19/19 1230 12/19/19 1245 12/19/19 1300  BP: 140/86 138/81 132/74 118/67  Pulse:      Resp:      Temp:      TempSrc:      SpO2:      Weight:      Height:         Orthostatic VS for the past 24 hrs:  BP- Lying Pulse- Lying BP- Sitting Pulse- Sitting BP- Standing at 0 minutes Pulse- Standing at 0 minutes  12/19/19 0925 120/80 76 125/69 87 90/55 106     Final Clinical Impression(s) / ED Diagnoses Final diagnoses:  Near syncope  Dehydration  Acute cystitis with hematuria  Generalized weakness    Rx / DC Orders ED Discharge Orders    None       Layla Maw 12/19/19 1426    Little, Wenda Overland, MD 12/19/19 [phone]

## 2019-12-19 NOTE — Progress Notes (Signed)
Chaplain engaged in initial visit with Carl [name].  Carl [name] requested prayer.  His family is aware that he is in the hospital because they live together. Chaplain offered support.   Chaplain will follow-up as needed.

## 2019-12-19 NOTE — ED Triage Notes (Signed)
Patient presents via [facility] from home after a fall. He states he has been getting dizzy the past two days whenever he stands up. He also states he hasn't been able to drink any fluids for the past couple of days. He obtained a skin tear to his L arm when he fell and he states his shoulders are sore. Hx of bypass sx (2006) and kidney stones.  108/62 HR 92 RR 18 O2 Sat 97% RA

## 2019-12-19 NOTE — ED Notes (Signed)
Pt stood again at bedside for use of urinal. Became orthostatic again - 126/71, HR 71 and standing 103/65, HR 117. Pt denied any dizziness at the time he stood

## 2019-12-20 DIAGNOSIS — I130 Hypertensive heart and chronic kidney disease with heart failure and stage 1 through stage 4 chronic kidney disease, or unspecified chronic kidney disease: Secondary | ICD-10-CM | POA: Diagnosis present

## 2019-12-20 DIAGNOSIS — K5900 Constipation, unspecified: Secondary | ICD-10-CM | POA: Diagnosis present

## 2019-12-20 DIAGNOSIS — Y92009 Unspecified place in unspecified non-institutional (private) residence as the place of occurrence of the external cause: Secondary | ICD-10-CM | POA: Diagnosis not present

## 2019-12-20 DIAGNOSIS — Z66 Do not resuscitate: Secondary | ICD-10-CM | POA: Diagnosis present

## 2019-12-20 DIAGNOSIS — I2510 Atherosclerotic heart disease of native coronary artery without angina pectoris: Secondary | ICD-10-CM | POA: Diagnosis present

## 2019-12-20 DIAGNOSIS — I951 Orthostatic hypotension: Secondary | ICD-10-CM | POA: Diagnosis present

## 2019-12-20 DIAGNOSIS — W19XXXA Unspecified fall, initial encounter: Secondary | ICD-10-CM | POA: Diagnosis present

## 2019-12-20 DIAGNOSIS — E861 Hypovolemia: Secondary | ICD-10-CM | POA: Diagnosis present

## 2019-12-20 DIAGNOSIS — R296 Repeated falls: Secondary | ICD-10-CM | POA: Diagnosis present

## 2019-12-20 DIAGNOSIS — E860 Dehydration: Secondary | ICD-10-CM | POA: Diagnosis present

## 2019-12-20 DIAGNOSIS — N401 Enlarged prostate with lower urinary tract symptoms: Secondary | ICD-10-CM | POA: Diagnosis present

## 2019-12-20 DIAGNOSIS — N12 Tubulo-interstitial nephritis, not specified as acute or chronic: Secondary | ICD-10-CM | POA: Diagnosis present

## 2019-12-20 DIAGNOSIS — C61 Malignant neoplasm of prostate: Secondary | ICD-10-CM | POA: Diagnosis present

## 2019-12-20 DIAGNOSIS — I4821 Permanent atrial fibrillation: Secondary | ICD-10-CM | POA: Diagnosis present

## 2019-12-20 DIAGNOSIS — B965 Pseudomonas (aeruginosa) (mallei) (pseudomallei) as the cause of diseases classified elsewhere: Secondary | ICD-10-CM | POA: Diagnosis present

## 2019-12-20 DIAGNOSIS — N179 Acute kidney failure, unspecified: Secondary | ICD-10-CM | POA: Diagnosis present

## 2019-12-20 DIAGNOSIS — N1832 Chronic kidney disease, stage 3b: Secondary | ICD-10-CM | POA: Diagnosis present

## 2019-12-20 DIAGNOSIS — N3001 Acute cystitis with hematuria: Secondary | ICD-10-CM | POA: Diagnosis present

## 2019-12-20 DIAGNOSIS — I5032 Chronic diastolic (congestive) heart failure: Secondary | ICD-10-CM | POA: Diagnosis present

## 2019-12-20 DIAGNOSIS — E43 Unspecified severe protein-calorie malnutrition: Secondary | ICD-10-CM | POA: Diagnosis present

## 2019-12-20 DIAGNOSIS — Z951 Presence of aortocoronary bypass graft: Secondary | ICD-10-CM | POA: Diagnosis not present

## 2019-12-20 DIAGNOSIS — E785 Hyperlipidemia, unspecified: Secondary | ICD-10-CM | POA: Diagnosis present

## 2019-12-20 DIAGNOSIS — Z20822 Contact with and (suspected) exposure to covid-19: Secondary | ICD-10-CM | POA: Diagnosis present

## 2019-12-20 DIAGNOSIS — Z79899 Other long term (current) drug therapy: Secondary | ICD-10-CM | POA: Diagnosis not present

## 2019-12-20 DIAGNOSIS — D509 Iron deficiency anemia, unspecified: Secondary | ICD-10-CM | POA: Diagnosis present

## 2019-12-20 LAB — CBC
HCT: 29.8 % — ABNORMAL LOW (ref 39.0–52.0)
Hemoglobin: 9.3 g/dL — ABNORMAL LOW (ref 13.0–17.0)
MCH: 30.9 pg (ref 26.0–34.0)
MCHC: 31.2 g/dL (ref 30.0–36.0)
MCV: 99 fL (ref 80.0–100.0)
Platelets: 131 10*3/uL — ABNORMAL LOW (ref 150–400)
RBC: 3.01 MIL/uL — ABNORMAL LOW (ref 4.22–5.81)
RDW: 16.7 % — ABNORMAL HIGH (ref 11.5–15.5)
WBC: 6.1 10*3/uL (ref 4.0–10.5)
nRBC: 0 % (ref 0.0–0.2)

## 2019-12-20 LAB — BASIC METABOLIC PANEL
Anion gap: 8 (ref 5–15)
BUN: 17 mg/dL (ref 8–23)
CO2: 22 mmol/L (ref 22–32)
Calcium: 8.4 mg/dL — ABNORMAL LOW (ref 8.9–10.3)
Chloride: 108 mmol/L (ref 98–111)
Creatinine, Ser: 1.16 mg/dL (ref 0.61–1.24)
GFR calc Af Amer: >60 mL/min (ref >60)
GFR calc non Af Amer: 58 mL/min — ABNORMAL LOW (ref >60)
Glucose, Bld: 96 mg/dL (ref 70–99)
Potassium: 3.9 mmol/L (ref 3.5–5.1)
Sodium: 138 mmol/L (ref 135–145)

## 2019-12-20 LAB — PHOSPHORUS: Phosphorus: 2.9 mg/dL (ref 2.5–4.6)

## 2019-12-20 LAB — MAGNESIUM: Magnesium: 1.6 mg/dL — ABNORMAL LOW (ref 1.7–2.4)

## 2019-12-20 MED ORDER — GERHARDT'S BUTT CREAM
TOPICAL_CREAM | Freq: Four times a day (QID) | CUTANEOUS | Status: DC
  Filled 2019-12-20: qty 1

## 2019-12-20 MED ORDER — MAGNESIUM SULFATE 2 GM/50ML IV SOLN
2.0000 g | Freq: Once | INTRAVENOUS | Status: AC
  Administered 2019-12-20: 2 g via INTRAVENOUS
  Filled 2019-12-20: qty 50

## 2019-12-20 MED ORDER — SODIUM CHLORIDE 0.9 % IV SOLN
510.0000 mg | INTRAVENOUS | Status: DC
  Administered 2019-12-20: 510 mg via INTRAVENOUS
  Filled 2019-12-20: qty 17

## 2019-12-20 MED ORDER — ENSURE ENLIVE PO LIQD
237.0000 mL | Freq: Two times a day (BID) | ORAL | Status: DC
  Administered 2019-12-20 – 2019-12-23 (×5): 237 mL via ORAL

## 2019-12-20 MED ORDER — SODIUM CHLORIDE 0.9 % IV SOLN: INTRAVENOUS | Status: DC

## 2019-12-20 MED ORDER — ADULT MULTIVITAMIN W/MINERALS CH
1.0000 | ORAL_TABLET | Freq: Every day | ORAL | Status: DC
  Administered 2019-12-20 – 2019-12-23 (×4): 1 via ORAL
  Filled 2019-12-20 (×4): qty 1

## 2019-12-20 NOTE — Care Management Obs Status (Signed)
[facility] NOTIFICATION   Patient Details  Name: Carl [name] MRN: [id number] Date of Birth: October 05, 1935   Medicare Observation Status Notification Given:  Yes    Carles Collet, RN 12/20/2019, 11:36 AM

## 2019-12-20 NOTE — Care Management (Signed)
EMMERICH, PADALINO Daughter [phone]   On the phone at request of patient. She states that he was active a [facility]. Patient and daughter stopped home hospice services because hospice would not cover a medication for what they considered curative treatment. He was not active w any home health services prior to admission.  Daughter is not sure at this time if they will want home hospice or home health services at discharge.  TOC will continue to follow

## 2019-12-20 NOTE — Discharge Instructions (Signed)
Chondroid focus on hip xrays  Information on my medicine - ELIQUIS (apixaban)  This medication education was reviewed with me or my healthcare representative as part of my discharge preparation.  The pharmacist that spoke with me during my hospital stay was:  Onnie Boer, RPH-CPP  Why was Eliquis prescribed for you? Eliquis was prescribed for you to reduce the risk of a blood clot forming that can cause a stroke if you have a medical condition called atrial fibrillation (a type of irregular heartbeat).  What do You need to know about Eliquis ? Take your Eliquis TWICE DAILY - one tablet in the morning and one tablet in the evening with or without food. If you have difficulty swallowing the tablet whole please discuss with your pharmacist how to take the medication safely.  Take Eliquis exactly as prescribed by your doctor and DO NOT stop taking Eliquis without talking to the doctor who prescribed the medication.  Stopping may increase your risk of developing a stroke.  Refill your prescription before you run out.  After discharge, you should have regular check-up appointments with your healthcare provider that is prescribing your Eliquis.  In the future your dose may need to be changed if your kidney function or weight changes by a significant amount or as you get older.  What do you do if you miss a dose? If you miss a dose, take it as soon as you remember on the same day and resume taking twice daily.  Do not take more than one dose of ELIQUIS at the same time to make up a missed dose.  Important Safety Information A possible side effect of Eliquis is bleeding. You should call your healthcare provider right away if you experience any of the following: ? Bleeding from an injury or your nose that does not stop. ? Unusual colored urine (red or dark brown) or unusual colored stools (red or black). ? Unusual bruising for unknown reasons. ? A serious fall or if you hit your head (even if  there is no bleeding).  Some medicines may interact with Eliquis and might increase your risk of bleeding or clotting while on Eliquis. To help avoid this, consult your healthcare provider or pharmacist prior to using any new prescription or non-prescription medications, including herbals, vitamins, non-steroidal anti-inflammatory drugs (NSAIDs) and supplements.  This website has more information on Eliquis (apixaban): http://www.eliquis.com/eliquis/home

## 2019-12-20 NOTE — Plan of Care (Signed)

## 2019-12-20 NOTE — Progress Notes (Signed)
Initial Nutrition Assessment  RD working remotely.  DOCUMENTATION CODES:   Not applicable  INTERVENTION:   -Ensure Enlive po BID, each supplement provides 350 kcal and 20 grams of protein -MVI with minerals daily -Magic cup BID with meals, each supplement provides 290 kcal and 9 grams of protein  NUTRITION DIAGNOSIS:   Increased nutrient needs related to wound healing as evidenced by estimated needs.  GOAL:   Patient will meet greater than or equal to 90% of their needs  MONITOR:   PO intake, Supplement acceptance, Labs, Weight trends, Skin, I & O's  REASON FOR ASSESSMENT:   Malnutrition Screening Tool    ASSESSMENT:   full thickness skin tear (avulsion) to left elbow, chronic tissue injury (poisture plus friction) to bilateral buttocks with purple discoloration  Pt admitted s/p fall with UTI and orthostatic hypotension.   Reviewed I/O's: +2.6 L x 24 hours   UOP: 470 ml x 24 hours  Attempted to speak with pt via phone, however, no answer.   Per H&P, pt with very poor appetite 2 days PTA. Per wt hx, wt has been stable over the past year.   Pt is currently on a heart healthy diet; meal completion 75-100%.   Per [facility]OC[facility] assessment, pt with full thickness skin tear (avulsion) to left elbow and chronic tissue injury (moisture plus friction) to bilateral buttocks with purple discoloration.  Medications reviewed and include 0.9% sodium chloride infusion @ 75 ml/hr.   Labs reviewed: Mg: 1.6.   Diet Order:   Diet Order            Diet Heart Room service appropriate? No; Fluid consistency: Thin  Diet effective now              EDUCATION NEEDS:   No education needs have been identified at this time  Skin:  Skin Assessment: Skin Integrity Issues: Skin Integrity Issues:: Other (Comment) Other: full thickness skin tear (avulsion) to left elbow, chronic tissue injury (moisture plus friction) to bilateral buttocks with purple discoloration  Last BM:   12/18/19  Height:   Ht Readings from Last 1 Encounters:  12/19/19 5\' 10"  (1.778 m)    Weight:   Wt Readings from Last 1 Encounters:  12/19/19 65.3 kg    Ideal Body Weight:  75.5 kg  BMI:  Body mass index is 20.66 kg/m.  Estimated Nutritional Needs:   Kcal:  1950-2150  Protein:  95-110 grams  Fluid:  > 1.9 L    Loistine Chance, RD, LDN, [place] Registered Dietitian II Certified Diabetes Care and Education Specialist Please refer to [facility] for RD and/or RD on-call/weekend/after hours pager

## 2019-12-20 NOTE — Evaluation (Signed)
Occupational Therapy Evaluation Patient Details Name: Carl [name] MRN: [id number] DOB: 05-Oct-1935 Today's Date: 12/20/2019    History of Present Illness Patient presented to ER after fall at home. Has wound on left elbow and sacral wound.   Clinical Impression   PTA, pt was living at home with his family, pt reports his family assisted with ADL and pt reports he used RW for mobility. Pt currently requires modA to stand from recliner, he requires assistance to access and apply lotion to BLE. Pt required minA for stand-pivot transfer to return to bed. Pt was unable to tolerate sitting in recliner with cushion due to pain in buttocks. Due to decline in current level of function, pt would benefit from acute OT to address established goals to facilitate safe D/C to venue listed below. At this time, recommend HHOT with 24/7 physical assistance follow-up. Will continue to follow acutely.     Follow Up Recommendations  SNF;Supervision/Assistance - 24 hour    Equipment Recommendations  3 in 1 bedside commode    Recommendations for Other Services       Precautions / Restrictions Precautions Precautions: Fall Restrictions Weight Bearing Restrictions: No      Mobility Bed Mobility Overal bed mobility: Needs Assistance Bed Mobility: Sit to Supine     Supine to sit: Mod assist Sit to supine: Min assist   General bed mobility comments: minA for trunk and BLE management  Transfers Overall transfer level: Needs assistance Equipment used: Rolling walker (2 wheeled) Transfers: Sit to/from Stand Sit to Stand: Mod assist         General transfer comment: modA from recliner;cues for safe hand placement    Balance Overall balance assessment: Needs assistance Sitting-balance support: Feet supported Sitting balance-Leahy Scale: Fair     Standing balance support: Bilateral upper extremity supported;During functional activity Standing balance-Leahy Scale: Fair Standing balance  comment: reliant on RW and external support                           ADL either performed or assessed with clinical judgement   ADL Overall ADL's : Needs assistance/impaired Eating/Feeding: Set up;Sitting   Grooming: Set up;Sitting   Upper Body Bathing: Min guard;Sitting   Lower Body Bathing: Moderate assistance;Sit to/from stand   Upper Body Dressing : Minimal assistance;Sitting   Lower Body Dressing: Moderate assistance;Sit to/from stand   Toilet Transfer: Minimal Scientist, forensic Details (indicate cue type and reason): simulated return to bed Toileting- Clothing Manipulation and Hygiene: Moderate assistance;Sit to/from stand Toileting - Clothing Manipulation Details (indicate cue type and reason): modA to powerup into standing     Functional mobility during ADLs: Minimal assistance;Rolling walker General ADL Comments: pt limited by decreased activity tolerance, generalized weakness and pain      Vision         Perception     Praxis      Pertinent Vitals/Pain Pain Assessment: Faces Faces Pain Scale: Hurts little more Pain Location: butt Pain Descriptors / Indicators: Aching Pain Intervention(s): Limited activity within patient's tolerance;Monitored during session     Hand Dominance Right   Extremity/Trunk Assessment Upper Extremity Assessment Upper Extremity Assessment: Generalized weakness   Lower Extremity Assessment Lower Extremity Assessment: Generalized weakness   Cervical / Trunk Assessment Cervical / Trunk Assessment: Kyphotic   Communication Communication Communication: No difficulties   Cognition Arousal/Alertness: Awake/alert Behavior During Therapy: WFL for tasks assessed/performed Overall Cognitive Status: Within Functional Limits for tasks assessed  General Comments  pt reported dizziness with standing, resolved with return to sitting    Exercises      Shoulder Instructions      Home Living Family/patient expects to be discharged to:: Private residence Living Arrangements: Children Available Help at Discharge: Family;Available 24 hours/day Type of Home: House Home Access: Stairs to enter [employer] of Steps: 5-6 rail on back steps only Entrance Stairs-Rails: Right Home Layout: One level     Bathroom Shower/Tub: Teacher, early years/pre: Standard     Home Equipment: Environmental consultant - 2 wheels;Toilet riser   Additional Comments: pt takes sponge bath as he is fearful of falling in the bathroom      Prior Functioning/Environment Level of Independence: Needs assistance    ADL's / Homemaking Assistance Needed: pt reports his family was assisting with ADL;pt is sponge bathing due to fear of falling            OT Problem List: Decreased strength;Decreased range of motion;Decreased activity tolerance;Impaired balance (sitting and/or standing);Decreased safety awareness;Decreased knowledge of use of DME or AE;Pain      OT Treatment/Interventions: Self-care/ADL training;Therapeutic exercise;Energy conservation;DME and/or AE instruction;Therapeutic activities;Patient/family education;Balance training    OT Goals(Current goals can be found in the care plan section) Acute Rehab OT Goals Patient Stated Goal: to decrease pain, feel better OT Goal Formulation: With patient Time For Goal Achievement: 01/03/20 Potential to Achieve Goals: Fair ADL Goals Pt Will Perform Grooming: with set-up;sitting Pt Will Perform Lower Body Dressing: with min guard assist;sit to/from stand Pt Will Transfer to Toilet: with min guard assist;stand pivot transfer Additional ADL Goal #1: Pt/caregiver will demonstrate use of 3 fall prevention strategies during ADL/IADL and functional mobility.  OT Frequency: Min 2X/week   Barriers to D/C: Inaccessible home environment  5 steps to enter house       Co-evaluation               AM-PAC OT "6 Clicks" Daily Activity     Outcome Measure Help from another person eating meals?: A Little Help from another person taking care of personal grooming?: A Little Help from another person toileting, which includes using toliet, bedpan, or urinal?: A Lot Help from another person bathing (including washing, rinsing, drying)?: A Lot Help from another person to put on and taking off regular upper body clothing?: A Little Help from another person to put on and taking off regular lower body clothing?: A Lot 6 Click Score: 15   End of Session Equipment Utilized During Treatment: Gait belt;Rolling walker Nurse Communication: Mobility status;Patient requests pain meds  Activity Tolerance: Patient tolerated treatment well Patient left: in bed;with call bell/phone within reach;with bed alarm set  OT Visit Diagnosis: Unsteadiness on feet (R26.81);Other abnormalities of gait and mobility (R26.89);Muscle weakness (generalized) (M62.81);History of falling (Z91.81);Pain Pain - part of body: (butt)                Time: [id number] OT Time Calculation (min): 24 min Charges:  OT General Charges $OT Visit: 1 Visit OT Evaluation $OT Eval Moderate Complexity: 1 Mod OT Treatments $Self Care/Home Management : 8-22 mins  Helene Kelp OTR/L Acute Rehabilitation Services Office: [phone]   Wyn Forster 12/20/2019, 3:26 PM

## 2019-12-20 NOTE — Progress Notes (Signed)
PROGRESS NOTE    Carl [name]  [id number] DOB: [birth year] DOA: 12/19/2019 PCP: [name], MD   Brief Narrative:  56 Caucasian male  CAD CABG X5 2006 HFrEF (improved 55-60 oh)/PA peak 62 09/16/2016 CKD 3B Permanent A. fib CHA2DS2-VASc >4/Eliquis status post cardioversion Dr. Marlou Porch 11/19/2016 HLD BPH prostate CA (? Recurrent)-renal calculi status post stenting Dr. Link Snuffer 06/15/2019 and previous to this in addition back in 2017 Dr. Risa Grill Lung nodule Last hospitalization [id number] X 2 = metabolic encephalopathy? With recurrent UTIs and most admissions dating back to 2019 polypharmacy Right femoral neck fracture 09/2018 + epidural hematoma subdural hematoma  Admit 5/24 multiple falls weakness frequency volume depletion Found to be orthostatic-started ABX ceftriaxone? Complicated UTI BUNs/creatinine elevated above baseline 20/1.3  Assessment & Plan:   Active Problems:   Fall at home, initial encounter   Fall  AKI leading to fall Orthostatic vital signs are negative-108/68, HR 67, sitting 116/167, HR 69, standing 106/66 HR 65 1. Only medication with potential to cause drop in blood pressure is Flomax 0.4 which is usually quite selective as an alpha-blocker-do not think this is the cause 2. Continue saline hydration at a lower rate 75 cc/H  3. Unsafe to discharge home unless significant support-we will reach out to family Pyelonephritis from Pseudomonas 1. Cultures are pending at this time-has a history of MDR resistant bugs 2. Continue ceftriaxone IV for now but narrow rapidly 3. CBC plus differential a.m. Permanent A. fib CHADS2 score >4 1. Continue amiodarone 200 daily, Eliquis 5 twice daily 2. Keep on monitors overnight-seems in rate controlled sinus BPH prostate cancer as well as multiple procedures on kidneys in the past Dr. Link Snuffer, Dr. Rana Snare 1. Continue Flomax 2. No further imaging required unless develops pain or hematuria CKD  3B Hypomagnesemia 1. BUNs/creatinine is improved from admission closer to his baseline continue saline as above 2. Given 2 g of magnesium for magnesium 1.6 earlier today Iron deficient but macrocytic anemia 1. Bimodal kinetics of anemia however sat ratios are 9 and iron is 25 2. Giving IV iron 5/25.today 3. May require Hemoccults but would have outpatient discussion regarding colonoscopy unless has acute bleed (is on anticoagulation for A. fib) Prior subdural hematoma 1. Has bled score is elevated however without anticoagulation he is at high risk for stroke as CHADS2 score is >4 Prior hip replacement 09/2018 1. Therapy recommending skilled care 3 in 1 commode   He is not stable for discharge at this time and will probably need skilled care I will convert him to inpatient  DVT prophylaxis: On Eliquis Code Status: Remains full code Family Communication: LVM daughter Daeton Lefevre [id number] Disposition: Skilled facility  Status is: Observation  The patient will require care spanning > 2 midnights and should be moved to inpatient because: Ongoing diagnostic testing needed not appropriate for outpatient work up and Unsafe d/c plan  Dispo: The patient is from: SNF              Anticipated d/c is to: SNF              Anticipated d/c date is: 2 days              Patient currently is not medically stable to d/c.    Consultants:   None  Procedures: None  Antimicrobials: IV ceftriaxone   Subjective: Awake alert somewhat coherent slow speech can orient to place [facility]) but cannot tell me any of his medical history Seems to  eaten some of his meal-denies chest pain fever chills nausea vomiting  Objective: Vitals:   12/19/19 1512 12/19/19 1959 12/20/19 0417 12/20/19 0740  BP: 124/70 133/74 140/83 136/74  Pulse: 79 73 72 81  Resp: 14 18 14 14   Temp: 98 F (36.7 C) (!) 97.4 F (36.3 C) 97.7 F (36.5 C) 97.7 F (36.5 C)  TempSrc:  Oral Oral Oral  SpO2: 99% 98% 98% 96%   Weight:      Height:        Intake/Output Summary (Last 24 hours) at 12/20/2019 0743 Last data filed at 12/20/2019 0500 Gross per 24 hour  Intake 3040.49 ml  Output 470 ml  Net 2570.49 ml   Filed Weights   12/19/19 0639  Weight: 65.3 kg    Examination:  General exam: Cachectic bitemporal and supraclavicular wasting Arcus senilis no icterus no pallor Respiratory system: Clear no added sound no rales no rhonchi Cardiovascular system: S1-S2 no murmur rub or gallop Gastrointestinal system: Abdomen soft nontender nondistended no rebound no guarding. Central nervous system: Intact moving all 4 limbs equally power 5/5 sensory grossly intact Extremities: ROM intact Skin: No rash Psychiatry: Euthymic but flat affect    Data Reviewed: I have personally reviewed following labs and imaging studies BUN/creatinine 20/1.3-->17/1.1 magnesium 1.6 Platelet 131-baseline around 200 Hemoglobin 10.3-->9.3 MCV 99 Urine culture >100,000 GM - rods CT head consistent with meningioma unchanged from prior  Radiology Studies: DG Chest 2 View  Result Date: 12/19/2019 CLINICAL DATA:  Cough.  Recent fall EXAM: CHEST - 2 VIEW COMPARISON:  March 08, 2019 FINDINGS: Lungs are clear. Heart size and pulmonary vascularity are normal. No adenopathy. Patient is status post coronary artery bypass grafting. There is aortic atherosclerosis. Bones are osteoporotic. There old healed fractures of several ribs on the right. No pneumothorax. IMPRESSION: Lungs clear. Stable cardiac silhouette. Postoperative changes. Bones osteoporotic. No pneumothorax. Aortic Atherosclerosis (ICD10-I70.0). Electronically Signed   By: Lowella Grip III M.D.   On: 12/19/2019 08:01   DG Elbow Complete Left  Result Date: 12/19/2019 CLINICAL DATA:  Pain following fall EXAM: LEFT ELBOW - COMPLETE 3+ VIEW COMPARISON:  None. FINDINGS: Frontal, lateral, and bilateral oblique views were obtained. Bones are osteoporotic. There is no appreciable  fracture or dislocation. There is no appreciable joint space narrowing. There is chondrocalcinosis. No erosive change. IMPRESSION: Osteoporosis. No fracture or dislocation. No significant joint space narrowing. Chondrocalcinosis is present, a finding that may be seen with osteoarthritis or with calcium pyrophosphate deposition disease. Electronically Signed   By: Lowella Grip III M.D.   On: 12/19/2019 08:02   CT Head Wo Contrast  Result Date: 12/19/2019 CLINICAL DATA:  Fall. Dizziness. EXAM: CT HEAD WITHOUT CONTRAST CT CERVICAL SPINE WITHOUT CONTRAST TECHNIQUE: Multidetector CT imaging of the head and cervical spine was performed following the standard protocol without intravenous contrast. Multiplanar CT image reconstructions of the cervical spine were also generated. COMPARISON:  02/25/2019. FINDINGS: CT HEAD FINDINGS Brain: No evidence of acute infarction, hemorrhage, hydrocephalus, extra-axial collection or mass lesion/mass effect. There is mild diffuse low-attenuation within the subcortical and periventricular white matter compatible with chronic microvascular disease. Prominence of the sulci and ventricles compatible with brain atrophy. Unchanged left frontal region extra-axial mass containing calcifications measuring 2.5 x 0.9 cm and compatible with a meningioma. Vascular: No hyperdense vessel or unexpected calcification. Skull: Normal. Negative for fracture or focal lesion. Sinuses/Orbits: Opacification of the left anterior ethmoid air cells and frontal sinus identified. Other: None. CT CERVICAL SPINE FINDINGS Alignment: Normal. Skull  base and vertebrae: No acute fracture. No primary bone lesion or focal pathologic process. Soft tissues and spinal canal: No prevertebral fluid or swelling. No visible canal hematoma. Disc levels: Multi level disc space narrowing and endplate spurring identified. This is most advanced at C6-7. Upper chest: Negative Other: None IMPRESSION: 1. No acute intracranial  abnormalities. 2. Chronic small vessel ischemic change and brain atrophy. 3. Stable left frontal region meningioma. 4. No evidence for cervical spine fracture. 5. Cervical spondylosis. Electronically Signed   By: Kerby Moors M.D.   On: 12/19/2019 08:46   CT Cervical Spine Wo Contrast  Result Date: 12/19/2019 CLINICAL DATA:  Fall. Dizziness. EXAM: CT HEAD WITHOUT CONTRAST CT CERVICAL SPINE WITHOUT CONTRAST TECHNIQUE: Multidetector CT imaging of the head and cervical spine was performed following the standard protocol without intravenous contrast. Multiplanar CT image reconstructions of the cervical spine were also generated. COMPARISON:  02/25/2019. FINDINGS: CT HEAD FINDINGS Brain: No evidence of acute infarction, hemorrhage, hydrocephalus, extra-axial collection or mass lesion/mass effect. There is mild diffuse low-attenuation within the subcortical and periventricular white matter compatible with chronic microvascular disease. Prominence of the sulci and ventricles compatible with brain atrophy. Unchanged left frontal region extra-axial mass containing calcifications measuring 2.5 x 0.9 cm and compatible with a meningioma. Vascular: No hyperdense vessel or unexpected calcification. Skull: Normal. Negative for fracture or focal lesion. Sinuses/Orbits: Opacification of the left anterior ethmoid air cells and frontal sinus identified. Other: None. CT CERVICAL SPINE FINDINGS Alignment: Normal. Skull base and vertebrae: No acute fracture. No primary bone lesion or focal pathologic process. Soft tissues and spinal canal: No prevertebral fluid or swelling. No visible canal hematoma. Disc levels: Multi level disc space narrowing and endplate spurring identified. This is most advanced at C6-7. Upper chest: Negative Other: None IMPRESSION: 1. No acute intracranial abnormalities. 2. Chronic small vessel ischemic change and brain atrophy. 3. Stable left frontal region meningioma. 4. No evidence for cervical spine  fracture. 5. Cervical spondylosis. Electronically Signed   By: Kerby Moors M.D.   On: 12/19/2019 08:46   DG Hips Bilat W or Wo Pelvis 3-4 Views  Result Date: 12/19/2019 CLINICAL DATA:  Pain following fall EXAM: DG HIP (WITH OR WITHOUT PELVIS) 3-4V BILAT COMPARISON:  March 02, 2019 FINDINGS: Frontal pelvis as well as frontal and lateral views of each hip joint obtained. There is a total hip replacement on the right with prosthetic components well seated. There is underlying osteoporosis. No acute fracture or dislocation. There is mild narrowing of the left hip joint. There is a suspected bone island in the supra-acetabular region on the right. Sclerosis is noted in the intertrochanteric region of the proximal left femur, stable. Seed implants are noted in the prostate. IMPRESSION: No acute fracture or dislocation. Total hip replacement on the right with prosthetic components well-seated. Underlying osteoporosis. Probable bone island in the supra-acetabular region on the right, a stable finding. Sclerotic area in the intertrochanteric region on the left, stable. This area has a chondroid type matrix and may represent an enchondroma or bone infarct. Neoplastic focus felt to be less likely. Seed implants noted in prostate. Electronically Signed   By: Lowella Grip III M.D.   On: 12/19/2019 08:05      Scheduled Meds: . amiodarone  200 mg Oral Daily  . apixaban  5 mg Oral BID  . pantoprazole  40 mg Oral Daily  . rosuvastatin  40 mg Oral Daily  . tamsulosin  0.4 mg Oral QPC supper   Continuous Infusions: .  sodium chloride 75 mL/hr at 12/19/19 1817  . cefTRIAXone (ROCEPHIN)  IV 1 g (12/19/19 2224)    LOS: 0 days   Time spent: [place], MD [facility]  To contact the attending provider between 7A-7P or the covering provider during after hours 7P-7A, please log into the web site [email or web address] and access using universal [place] password for that web site. If you do  not have the password, please call the hospital operator.  12/20/2019, 7:43 AM

## 2019-12-20 NOTE — Progress Notes (Signed)
Spoke with family patient's daughter [name] on the phone subsequent to calling and she is willing to let him go to skilled facility.  She tells me he was on Hospice because of failure to thrive--came off hospice 2-3 weeks ago--He was on a bisphosphonate and Hospice was unable to cover him as it was still active care.  I will change to him DNAR at daughter's request  Verneita Griffes, MD [facility] 5:06 PM

## 2019-12-20 NOTE — Evaluation (Signed)
Physical Therapy Evaluation Patient Details Name: Carl [name] MRN: [id number] DOB: May 17, 1936 Today's Date: 12/20/2019   History of Present Illness  Patient presented to ER after fall at home. Has wound on left elbow and sacral wound.  Clinical Impression  Patient received in bed, reports pain in left arm, and back. Agrees to PT assessment. He required mod assist to get up from supine to sit at edge of bed. Mod assist to perform sit to stand from elevated bed with cues for proper hand placement. Patient wanted to pull up from walker. He was able to ambulate 12 feet in room with RW and min guard assist. Tends to pick up walker at times and carry it. He will continue to benefit from skilled PT while here to improve strength, functional independence and activity tolerance.     Follow Up Recommendations Home health PT;Supervision for mobility/OOB;Supervision/Assistance - 24 hour    Equipment Recommendations  None recommended by PT    Recommendations for Other Services       Precautions / Restrictions Precautions Precautions: Fall Restrictions Weight Bearing Restrictions: No      Mobility  Bed Mobility Overal bed mobility: Needs Assistance Bed Mobility: Supine to Sit     Supine to sit: Mod assist        Transfers Overall transfer level: Needs assistance Equipment used: Rolling walker (2 wheeled) Transfers: Sit to/from Stand Sit to Stand: Mod assist;From elevated surface         General transfer comment: Cues for safe hand placement  Ambulation/Gait Ambulation/Gait assistance: Min guard Gait Distance (Feet): 12 Feet Assistive device: Rolling walker (2 wheeled) Gait Pattern/deviations: Step-through pattern;Decreased stride length;Narrow base of support Gait velocity: decreased   General Gait Details: fatigued and reports he gets winded with short distance of ambulation. O2 saturations > 90%  Stairs            Wheelchair Mobility    Modified Rankin  (Stroke Patients Only)       Balance Overall balance assessment: Needs assistance Sitting-balance support: Feet supported Sitting balance-Leahy Scale: Fair     Standing balance support: Bilateral upper extremity supported;During functional activity Standing balance-Leahy Scale: Fair Standing balance comment: reliant on RW and external support                             Pertinent Vitals/Pain Pain Assessment: Faces Faces Pain Scale: Hurts little more Pain Location: left arm, shoulder, back Pain Descriptors / Indicators: Aching Pain Intervention(s): Patient requesting pain meds-RN notified    Home Living Family/patient expects to be discharged to:: Private residence Living Arrangements: Children Available Help at Discharge: Family;Available 24 hours/day Type of Home: House Home Access: Stairs to enter   [employer] of Steps: 5-6 rail on back steps only Home Layout: One level Home Equipment: Walker - 2 wheels;Toilet riser Additional Comments: pt takes sponge bath as he is fearful of falling in the bathroom    Prior Function Level of Independence: Needs assistance               Hand Dominance        Extremity/Trunk Assessment   Upper Extremity Assessment Upper Extremity Assessment: Generalized weakness    Lower Extremity Assessment Lower Extremity Assessment: Generalized weakness    Cervical / Trunk Assessment Cervical / Trunk Assessment: Kyphotic  Communication   Communication: No difficulties  Cognition Arousal/Alertness: Awake/alert Behavior During Therapy: WFL for tasks assessed/performed Overall Cognitive Status: Within Functional Limits  for tasks assessed                                        General Comments      Exercises     Assessment/Plan    PT Assessment Patient needs continued PT services  PT Problem List Decreased strength;Decreased mobility;Decreased safety awareness;Decreased activity  tolerance;Decreased balance;Pain;Decreased knowledge of precautions       PT Treatment Interventions DME instruction;Therapeutic exercise;Gait training;Balance training;Stair training;Neuromuscular re-education;Functional mobility training;Therapeutic activities;Patient/family education    PT Goals (Current goals can be found in the Care Plan section)  Acute Rehab PT Goals Patient Stated Goal: to decrease pain, feel better PT Goal Formulation: With patient Time For Goal Achievement: 01/03/20 Potential to Achieve Goals: Good    Frequency Min 3X/week   Barriers to discharge Inaccessible home environment 5-6 steps to enter home    Co-evaluation               AM-PAC PT "6 Clicks" Mobility  Outcome Measure Help needed turning from your back to your side while in a flat bed without using bedrails?: A Little Help needed moving from lying on your back to sitting on the side of a flat bed without using bedrails?: A Lot Help needed moving to and from a bed to a chair (including a wheelchair)?: A Little Help needed standing up from a chair using your arms (e.g., wheelchair or bedside chair)?: A Little Help needed to walk in hospital room?: A Little Help needed climbing 3-5 steps with a railing? : A Lot 6 Click Score: 16    End of Session Equipment Utilized During Treatment: Gait belt Activity Tolerance: Patient limited by fatigue;Patient limited by pain Patient left: in chair;with chair alarm set;with call bell/phone within reach Nurse Communication: Mobility status PT Visit Diagnosis: Unsteadiness on feet (R26.81);Difficulty in walking, not elsewhere classified (R26.2);Muscle weakness (generalized) (M62.81);Pain Pain - Right/Left: Left Pain - part of body: Shoulder;Arm    Time: 1350-1415 PT Time Calculation (min) (ACUTE ONLY): 25 min   Charges:   PT Evaluation $PT Eval Moderate Complexity: 1 Mod PT Treatments $Gait Training: 8-22 mins        Colleena Kurtenbach, PT,  [facility] 12/20/19,2:23 PM

## 2019-12-20 NOTE — Consult Note (Signed)
[place] Nurse Consult Note: Reason for Consult: Patient with full thickness skin tear (avulsion) to left elbow, chronic tissue injury (poisture plus friction) to bilateral buttocks with purple discoloration. Wound type: Chronic tissue injury, trauma Pressure Injury POA: Yes Measurement: Left elbow measures 4cm x 2cm x 0.2cm with bright red, moist wound bed, scant serous exudate. Bilateral buttocks:  7cm x 10cm area of purple discoloration with shaggy, grated appearance to epidermis consistent with chronic tissue injury (moisture plus friction). Scattered areas of partial thickness pink tissue, dry. Wound bed:As described above Drainage (amount, consistency, odor) As described above Periwound: Dry. Heels intact. Dressing procedure/placement/frequency: I will provide Nursing with guidance for turning and repositioning from side to side while in bed, minimizing the supine position and a pressure redistribution chair cushion for his use when OOB to the chair.  Bilateral heel boots are provided while in bed.Topical care to the left elbow skin tear will be with xeroform as a wound contact layer, performed once daily. The buttocks will benefit from pressure redistribution, topical zinc:hydrocortisone:lotrimin (Gerhart's Butt Cream) application, and DermaTherapy low CoF bedlinens.  [place] nursing team will not follow, but will remain available to this patient, the nursing and medical teams.  Please re-consult if needed. Thanks, Maudie Flakes, MSN, RN, [place], Arther Abbott  Pager# [phone]

## 2019-12-20 NOTE — Progress Notes (Signed)
PT Cancellation Note  Patient Details Name: Carl [name] MRN: [id number] DOB: 07-05-1936   Cancelled Treatment:    Reason Eval/Treat Not Completed: Patient declined, no reason specified. States he feels bad, declines all mobility at this time. Will return later to re-attempt.    Enez Monahan 12/20/2019, 11:50 AM

## 2019-12-21 DIAGNOSIS — N12 Tubulo-interstitial nephritis, not specified as acute or chronic: Secondary | ICD-10-CM

## 2019-12-21 DIAGNOSIS — Y92009 Unspecified place in unspecified non-institutional (private) residence as the place of occurrence of the external cause: Secondary | ICD-10-CM

## 2019-12-21 DIAGNOSIS — W19XXXA Unspecified fall, initial encounter: Secondary | ICD-10-CM

## 2019-12-21 LAB — URINE CULTURE: Culture: >=100000 — AB

## 2019-12-21 MED ORDER — FOSFOMYCIN TROMETHAMINE 3 G PO PACK
3.0000 g | PACK | ORAL | Status: DC
  Administered 2019-12-21: 3 g via ORAL
  Filled 2019-12-21: qty 3

## 2019-12-21 MED ORDER — FERROUS SULFATE 325 (65 FE) MG PO TABS
325.0000 mg | ORAL_TABLET | Freq: Every day | ORAL | Status: DC
  Administered 2019-12-22 – 2019-12-23 (×2): 325 mg via ORAL
  Filled 2019-12-21 (×2): qty 1

## 2019-12-21 MED ORDER — POLYETHYLENE GLYCOL 3350 17 G PO PACK
17.0000 g | PACK | Freq: Every day | ORAL | Status: DC
  Administered 2019-12-21 – 2019-12-23 (×3): 17 g via ORAL
  Filled 2019-12-21 (×3): qty 1

## 2019-12-21 NOTE — Progress Notes (Signed)
Patient's grandson and girlfriend visitng patient, became confrontational, demanding answers against HIPPA policy, Deborah Chalk, department director, attempted to de-escalate situation and explain/educate policy and visitor policy.  Both became quickly angered and loud, pointing fingers,  grandson began video taping Ivin Booty, she told them to stop, she did not allow them to video tape her, The girlfriend began taking pictures, demanding Sharon's name.  Security was called immediately.  [name], case worker involved, called patient's sister and brother to inform of the situation and that visitors are restricted, Brother (as charted) is POA, both brother and sister are only ones on the visitor designation list for this patient's stay.  Girlfriend and grandson have been banned from entering unit.

## 2019-12-21 NOTE — TOC CAGE-AID Note (Signed)
Transition of Care [facility]) - CAGE-AID Screening   Patient Details  Name: Carl [name] MRN: [id number] Date of Birth: 09-Jan-1936  Transition of Care [facility]) CM/SW Contact:    Emeterio Reeve, [place] Phone Number: 12/21/2019, 4:37 PM   Clinical Narrative:  Pt denied alcohol and substance use.   CAGE-AID Screening:    Have You Ever Felt You Ought to Cut Down on Your Drinking or Drug Use?: No Have People Annoyed You By Critizing Your Drinking Or Drug Use?: No Have You Felt Bad Or Guilty About Your Drinking Or Drug Use?: No Have You Ever Had a Drink or Used Drugs First Thing In The Morning to STeady Your Nerves or to Get Rid of a Hangover?: No CAGE-AID Score: 0  Substance Abuse Education Offered: No    Blima Ledger, [place] Social Worker [phone]

## 2019-12-21 NOTE — Progress Notes (Signed)
PROGRESS NOTE    Carl [name]  [id number] DOB: 1936-03-24 DOA: 12/19/2019 PCP: [name], MD    Brief Narrative:  Carl [name] is a 84 y.o. male with medical history significant of metastatic prostate cancer on hormone therapy, paroxysmal A. fib on Eliquis, CKD stage II, chronic anemia, CAD status post CABG, presented with increasing weakness, urinary frequency and frequent falls.  Patient has chronic ambulation dysfunction usually uses a walker to ambulate.  3 days ago patient started to have urinary symptoms with frequent urinations but no dysuria or hematuria.  Denies any fever chills.  Over the last 2 days he felt his appetite significant drop and very dehydrated.  From last night he started to feel lightheadedness and blurry vision every time he tried to stand up or move around.  He had 3 episodes of fall but denied any loss of consciousness or head injuries. He landed on left elbow and right hip where he had a total hip earlier this year. And he felt pain on left elbow and right hip after those falls.  In the ED, CT head and neck, x-ray of left elbow and pelvis reassuring. UA showed signs of UTI.   Assessment & Plan:   Active Problems:   Fall at home, initial encounter   Fall   Pyelonephritis   Pseudomonas UTI Urine culture with Pseudomonas that is pansensitive.  Was initially started on ceftriaxone, will change to fosfomycin to receive 2 doses.  Falls with orthostatic hypotension Patient problems on home hospice, now rescinded.  Etiology likely secondary to hypovolemia from UTI and poor oral intake.  Received IV fluid hydration, on antibiotics as above.  PT/OT recommend SNF placement.  Hypomagnesemia Magnesium repleted IV on 12/20/2019. --Repeat BMP with magnesium level tomorrow morning  BPH Metastatic prostate cancer status post brachytherapy Continues with hormone therapy Lupron every 4-6 months; PSH has remained stable. --Continue tamsulosin 0.4 mg  p.o. daily --Continue outpatient follow-up with urology  Chronic kidney disease stage II Creatinine 1.30 on admission.  Improved with IV fluid hydration to 1.16.  Iron deficiency anemia Hemoglobin 9.3.  Iron 25, TIBC 287, ferritin 23.  Received dose of IV Feraheme on 12/20/2019. --Start ferrous sulfate 325 mg p.o. daily  Paroxysmal atrial fibrillation CHA2DS2-VASc score >4. --Continue amiodarone 2 mg p.o. daily --Continue anticoagulation with apixaban 5 mg p.o. twice daily  GERD: Continue Protonix 40 mg p.o. daily  Hyperlipidemia: Continue rosuvastatin 40 mg p.o. nightly  Pressure wounds, present on admission Pressure Injury 10/09/18 Stage II -  Partial thickness loss of dermis presenting as a shallow open ulcer with a red, pink wound bed without slough. pt had this wound from home per his report; chronic partial thickness wounds related to shear, not pressur (Active)  10/09/18 0730  Location: Buttocks  Location Orientation: Left;Right  Staging: Stage II -  Partial thickness loss of dermis presenting as a shallow open ulcer with a red, pink wound bed without slough.  Wound Description (Comments): pt had this wound from home per his report; chronic partial thickness wounds related to shear, not pressure  Present on Admission: Yes    Debility/generalized weakness --Continue PT/OT efforts   DVT prophylaxis: Eliquis Code Status: DNR Family Communication: No family present at bedside  Disposition Plan:  Status is: Inpatient  Remains inpatient appropriate because:Unsafe d/c plan   Dispo: The patient is from: Home; living with daughter/grandson              Anticipated d/c is to: SNF  Anticipated d/c date is: 1 day              Patient currently is medically stable to d/c.   Consultants:   none  Procedures:   none  Antimicrobials:   Ceftriaxone 5/24 - 5/26  Fosfomycin 5/26   Subjective: Patient seen and examined bedside, resting comfortably.   Reports no bowel movement in the last 2 days.  Continues with generalized weakness/fatigue.  No other complaints or concerns at this time.  Denies headache, no chest pain, palpitations, no abdominal pain, no fever/chills/night sweats, no nausea/vomiting/diarrhea.  No acute events overnight per nursing staff.  Objective: Vitals:   12/20/19 1600 12/20/19 2014 12/21/19 0443 12/21/19 0737  BP: 116/67 137/70 (!) 150/87 139/85  Pulse: 69 64 74 71  Resp: 14 16 16 14   Temp: 97.8 F (36.6 C) (!) 97.3 F (36.3 C) 97.7 F (36.5 C) 97.9 F (36.6 C)  TempSrc:  Oral Oral Oral  SpO2: 99% 98% 97% 99%  Weight:      Height:        Intake/Output Summary (Last 24 hours) at 12/21/2019 1138 Last data filed at 12/21/2019 1047 Gross per 24 hour  Intake 1229.71 ml  Output 2100 ml  Net -870.29 ml   Filed Weights   12/19/19 0639  Weight: 65.3 kg    Examination:  General exam: Appears calm and comfortable  Respiratory system: Clear to auscultation. Respiratory effort normal.  Oxygenating well on room air Cardiovascular system: S1 & S2 heard, RRR. No JVD, murmurs, rubs, gallops or clicks. No pedal edema. Gastrointestinal system: Abdomen is nondistended, soft and nontender. No organomegaly or masses felt. Normal bowel sounds heard. Central nervous system: Alert and oriented. No focal neurological deficits. Extremities: Symmetric 5 x 5 power. Skin: No rashes, lesions or ulcers Psychiatry: Judgement and insight appear poor. Mood & affect appropriate.     Data Reviewed: I have personally reviewed following labs and imaging studies  CBC: Recent Labs  Lab 12/19/19 0804 12/20/19 0656  WBC 6.5 6.1  NEUTROABS 5.6  --   HGB 10.3* 9.3*  HCT 33.1* 29.8*  MCV 99.1 99.0  PLT 145* [id number]*   Basic Metabolic Panel: Recent Labs  Lab 12/19/19 0804 12/20/19 0656  NA 140 138  K 4.2 3.9  CL 106 108  CO2 24 22  GLUCOSE 132* 96  BUN 20 17  CREATININE 1.30* 1.16  CALCIUM 8.8* 8.4*  MG  --  1.6*  PHOS   --  2.9   GFR: Estimated Creatinine Clearance: 45.3 mL/min (by C-G formula based on SCr of 1.16 mg/dL). Liver Function Tests: Recent Labs  Lab 12/19/19 0804  AST 24  ALT 31  ALKPHOS 61  BILITOT 0.6  PROT 6.1*  ALBUMIN 3.1*   No results for input(s): LIPASE, AMYLASE in the last 168 hours. No results for input(s): AMMONIA in the last 168 hours. Coagulation Profile: Recent Labs  Lab 12/19/19 0804  INR 1.4*   Cardiac Enzymes: Recent Labs  Lab 12/19/19 0804  CKTOTAL 201   BNP (last 3 results) No results for input(s): PROBNP in the last 8760 hours. HbA1C: No results for input(s): HGBA1C in the last 72 hours. CBG: No results for input(s): GLUCAP in the last 168 hours. Lipid Profile: No results for input(s): CHOL, HDL, LDLCALC, TRIG, CHOLHDL, LDLDIRECT in the last 72 hours. Thyroid Function Tests: No results for input(s): TSH, T4TOTAL, FREET4, T3FREE, THYROIDAB in the last 72 hours. Anemia Panel: Recent Labs    12/19/19 1521  FERRITIN 23*  TIBC 287  IRON 25*   Sepsis Labs: No results for input(s): PROCALCITON, LATICACIDVEN in the last 168 hours.  Recent Results (from the past 240 hour(s))  Urine culture     Status: Abnormal   Collection Time: 12/19/19  9:46 AM   Specimen: Urine, Random  Result Value Ref Range Status   Specimen Description URINE, RANDOM  Final   Special Requests NONE  Final   Culture (A)  Final    >=100,000 COLONIES/mL PSEUDOMONAS AERUGINOSA Two isolates with different morphologies were identified as the same organism.The most resistant organism was reported. Performed at [facility], [place] [address], [place], [place] [address]    Report Status 12/21/2019 FINAL  Final   Organism ID, Bacteria PSEUDOMONAS AERUGINOSA (A)  Final      Susceptibility   Pseudomonas aeruginosa - MIC*    CEFTAZIDIME 4 SENSITIVE Sensitive     CIPROFLOXACIN <=0.25 SENSITIVE Sensitive     GENTAMICIN <=1 SENSITIVE Sensitive     IMIPENEM 2 SENSITIVE Sensitive      CEFEPIME 8 SENSITIVE Sensitive     * >=100,000 COLONIES/mL PSEUDOMONAS AERUGINOSA  SARS Coronavirus 2 by RT PCR (hospital order, performed in [place] hospital lab) Nasopharyngeal Nasopharyngeal Swab     Status: None   Collection Time: 12/19/19  9:47 AM   Specimen: Nasopharyngeal Swab  Result Value Ref Range Status   SARS Coronavirus 2 NEGATIVE NEGATIVE Final    Comment: (NOTE) SARS-CoV-2 target nucleic acids are NOT DETECTED. The SARS-CoV-2 RNA is generally detectable in upper and lower respiratory specimens during the acute phase of infection. The lowest concentration of SARS-CoV-2 viral copies this assay can detect is 250 copies / mL. A negative result does not preclude SARS-CoV-2 infection and should not be used as the sole basis for treatment or other patient management decisions.  A negative result may occur with improper specimen collection / handling, submission of specimen other than nasopharyngeal swab, presence of viral mutation(s) within the areas targeted by this assay, and inadequate number of viral copies (<250 copies / mL). A negative result must be combined with clinical observations, patient history, and epidemiological information. Fact Sheet for Patients:   StrictlyIdeas.no Fact Sheet for Healthcare Providers: BankingDealers.co.za This test is not yet approved or cleared  by the [place] FDA and has been authorized for detection and/or diagnosis of SARS-CoV-2 by FDA under an Emergency Use Authorization (EUA).  This EUA will remain in effect (meaning this test can be used) for the duration of the COVID-19 declaration under Section 564(b)(1) of the Act, 21 U.S.C. section 360bbb-3(b)(1), unless the authorization is terminated or revoked sooner. Performed at [facility], [place] [address], [place], [place] [address]          Radiology Studies: No results found.      Scheduled Meds: . amiodarone   200 mg Oral Daily  . apixaban  5 mg Oral BID  . feeding supplement (ENSURE ENLIVE)  237 mL Oral BID BM  . fosfomycin  3 g Oral Q72H  . Gerhardt's butt cream   Topical QID  . multivitamin with minerals  1 tablet Oral Daily  . pantoprazole  40 mg Oral Daily  . rosuvastatin  40 mg Oral Daily  . tamsulosin  0.4 mg Oral QPC supper   Continuous Infusions: . sodium chloride 75 mL/hr at 12/20/19 1805  . ferumoxytol 510 mg (12/20/19 1807)     LOS: 1 day    Time spent: 11  minutes spent on chart review, discussion with nursing staff, consultants, updating family and interview/physical exam; more than 50% of that time was spent in counseling and/or coordination of care.    Emmerson Shuffield J [place] DO [facility] Available via Epic secure chat 7am-7pm After these hours, please refer to coverage provider listed on [email or web address] 12/21/2019, 11:38 AM

## 2019-12-21 NOTE — Progress Notes (Signed)
Pt has a hx of pseudomonas UTI. He was started on ceftriaxone at this admission for a suspected UTI. It grew back pseudomonas. D/w Dr. [place] and we will change his ceftriaxone to fosfomycin x 2 doses.   Onnie Boer, PharmD, BCIDP, AAHIVP, CPP Infectious Disease Pharmacist 12/21/2019 10:22 AM

## 2019-12-21 NOTE — Plan of Care (Signed)

## 2019-12-22 DIAGNOSIS — E43 Unspecified severe protein-calorie malnutrition: Secondary | ICD-10-CM | POA: Diagnosis present

## 2019-12-22 LAB — CBC
HCT: 29.3 % — ABNORMAL LOW (ref 39.0–52.0)
Hemoglobin: 9.1 g/dL — ABNORMAL LOW (ref 13.0–17.0)
MCH: 30.7 pg (ref 26.0–34.0)
MCHC: 31.1 g/dL (ref 30.0–36.0)
MCV: 99 fL (ref 80.0–100.0)
Platelets: 136 10*3/uL — ABNORMAL LOW (ref 150–400)
RBC: 2.96 MIL/uL — ABNORMAL LOW (ref 4.22–5.81)
RDW: 17 % — ABNORMAL HIGH (ref 11.5–15.5)
WBC: 3.9 10*3/uL — ABNORMAL LOW (ref 4.0–10.5)
nRBC: 0 % (ref 0.0–0.2)

## 2019-12-22 LAB — BASIC METABOLIC PANEL
Anion gap: 7 (ref 5–15)
BUN: 24 mg/dL — ABNORMAL HIGH (ref 8–23)
CO2: 25 mmol/L (ref 22–32)
Calcium: 8.5 mg/dL — ABNORMAL LOW (ref 8.9–10.3)
Chloride: 107 mmol/L (ref 98–111)
Creatinine, Ser: 1.11 mg/dL (ref 0.61–1.24)
GFR calc Af Amer: >60 mL/min (ref >60)
GFR calc non Af Amer: >60 mL/min (ref >60)
Glucose, Bld: 112 mg/dL — ABNORMAL HIGH (ref 70–99)
Potassium: 4.9 mmol/L (ref 3.5–5.1)
Sodium: 139 mmol/L (ref 135–145)

## 2019-12-22 LAB — MAGNESIUM: Magnesium: 2 mg/dL (ref 1.7–2.4)

## 2019-12-22 MED ORDER — SENNOSIDES-DOCUSATE SODIUM 8.6-50 MG PO TABS
2.0000 | ORAL_TABLET | Freq: Two times a day (BID) | ORAL | Status: DC
  Administered 2019-12-22 – 2019-12-23 (×3): 2 via ORAL
  Filled 2019-12-22 (×3): qty 2

## 2019-12-22 MED ORDER — BISACODYL 10 MG RE SUPP
10.0000 mg | Freq: Once | RECTAL | Status: AC
  Administered 2019-12-22: 10 mg via RECTAL
  Filled 2019-12-22: qty 1

## 2019-12-22 NOTE — NC FL2 (Signed)
[place] LEVEL OF CARE SCREENING TOOL     IDENTIFICATION  Patient Name: Carl [name] Birthdate: Mar 11, 1936 Sex: male Admission Date (Current Location): 12/19/2019  [facility] [place] Number:  Herbalist and Address:  [facility] [place], [place] [address] [place], [place] [address]      Provider Number: [id number]  Attending Physician Name and Address:  [place] Donnamarie Poag, DO  Relative Name and Phone Number:       Current Level of Care: Hospital Recommended Level of Care: [place] Prior Approval Number:    Date Approved/Denied:   PASRR Number: [id number]  Discharge Plan: SNF    Current Diagnoses: Patient Active Problem List   Diagnosis Date Noted  . Pyelonephritis 12/20/2019  . Fall 12/19/2019  . Weight loss 08/30/2019  . Onychomycosis 08/30/2019  . AAA (abdominal aortic aneurysm) ([place]) 08/26/2018  . Kidney stones 06/30/2018  . Allergic rhinitis 04/26/2018  . Urolithiasis 01/05/2018  . Frequent urination 01/01/2018  . Colon polyps 09/15/2017  . GERD (gastroesophageal reflux disease) 09/15/2017  . Wrist pain, acute, right 05/25/2017  . Low back pain 03/24/2017  . Lung nodule seen on imaging study: 7 mm nodule left midlung 03/13/2017  . Physical deconditioning 03/13/2017  . Normocytic anemia 03/13/2017  . Fall at home, initial encounter 03/12/2017  . Abdominal soreness 03/12/2017  . Generalized weakness 03/12/2017  . Leukocytosis 03/12/2017  . Hyponatremia 03/12/2017  . Orthostatic hypotension 02/16/2017  . Anticoagulated 09/19/2016  . History of prostate cancer 09/04/2016  . Persistent atrial fibrillation 09/04/2016  . Cardiomyopathy, ischemic 07/16/2012  . Chronic kidney disease (CKD), stage III (moderate) ([place]) 07/16/2012  . Essential hypertension 08/05/2011  . Murmur 08/05/2011  . 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 Dyslipidemia 02/04/2011    Orientation RESPIRATION BLADDER Height & Weight      Self, Time, Situation, Place  Normal Continent Weight: 65.3 kg Height:  5\' 10"  (177.8 cm)  BEHAVIORAL SYMPTOMS/MOOD NEUROLOGICAL BOWEL NUTRITION STATUS      Continent Diet(refer to d/c summary)  AMBULATORY STATUS COMMUNICATION OF NEEDS Skin   Extensive Assist   Bruising, Skin abrasions(skin tear, abrasions buttock)                       Personal Care Assistance Level of Assistance  Bathing, Dressing, Feeding Bathing Assistance: Maximum assistance Feeding assistance: Independent Dressing Assistance: Maximum assistance     Functional Limitations Info  Sight, Hearing, Speech Sight Info: Adequate Hearing Info: Adequate Speech Info: Adequate    SPECIAL CARE FACTORS FREQUENCY  PT (By licensed PT), OT (By licensed OT)     PT Frequency: 5x per week , evaluate and treat OT Frequency: 5x per week , evaluate and treat            Contractures Contractures Info: Not present    Additional Factors Info  Allergies, Code Status Code Status Info: DNR Allergies Info: Ciprofloxacin, Ibuprofen           Current Medications (12/22/2019):  This is the current hospital active medication list Current Facility-Administered Medications  Medication Dose Route Frequency Provider Last Rate Last Admin  . acetaminophen (TYLENOL) tablet 1,000 mg  1,000 mg Oral Q6H PRN Wynetta Fines T, MD   1,000 mg at 12/21/19 2114  . amiodarone (PACERONE) tablet 200 mg  200 mg Oral Daily Wynetta Fines T, MD   200 mg at 12/22/19 0926  . apixaban (ELIQUIS) tablet 5 mg  5  mg Oral BID Wynetta Fines T, MD   5 mg at 12/22/19 [id number]  . feeding supplement (ENSURE ENLIVE) (ENSURE ENLIVE) liquid 237 mL  237 mL Oral BID BM Nita Sells, MD   237 mL at 12/22/19 0926  . ferrous sulfate tablet 325 mg  325 mg Oral Q breakfast [place] Eric J, DO   325 mg at 12/22/19 [id number]  . ferumoxytol (FERAHEME) 510 mg in sodium chloride 0.9 % 100 mL IVPB  510 mg Intravenous Weekly Nita Sells, MD 468 mL/hr at 12/20/19 1807 510  mg at 12/20/19 1807  . fosfomycin (MONUROL) packet 3 g  3 g Oral Q72H [place] Eric J, DO   3 g at 12/21/19 1141  . Gerhardt's butt cream   Topical QID Nita Sells, MD   Given at 12/22/19 [phone]  . loratadine (CLARITIN) tablet 10 mg  10 mg Oral Daily PRN Wynetta Fines T, MD      . multivitamin with minerals tablet 1 tablet  1 tablet Oral Daily Nita Sells, MD   1 tablet at 12/22/19 0927  . ondansetron (ZOFRAN) tablet 4 mg  4 mg Oral Q6H PRN Wynetta Fines T, MD       Or  . ondansetron [facility]) injection 4 mg  4 mg Intravenous Q6H PRN Wynetta Fines T, MD      . pantoprazole (PROTONIX) EC tablet 40 mg  40 mg Oral Daily Wynetta Fines T, MD   40 mg at 12/22/19 [id number]  . polyethylene glycol (MIRALAX / GLYCOLAX) packet 17 g  17 g Oral Daily [place] Eric J, DO   17 g at 12/22/19 [id number]  . rosuvastatin (CRESTOR) tablet 40 mg  40 mg Oral Daily Wynetta Fines T, MD   40 mg at 12/22/19 [id number]  . senna-docusate (Senokot-S) tablet 2 tablet  2 tablet Oral BID [place] Eric J, DO   2 tablet at 12/22/19 [id number]  . tamsulosin (FLOMAX) capsule 0.4 mg  0.4 mg Oral QPC supper Wynetta Fines T, MD   0.4 mg at 12/21/19 1840     Discharge Medications: Please see discharge summary for a list of discharge medications.  Relevant Imaging Results:  Relevant Lab Results:   Additional Information OF:[id number]  Sharin Mons, RN

## 2019-12-22 NOTE — Progress Notes (Signed)
PT Cancellation Note  Patient Details Name: Carl [name] MRN: [id number] DOB: [birth year]   Cancelled Treatment:    Reason Eval/Treat Not Completed: (P) Patient declined, no reason specified(Pt refused reports," I'm not answering any questions.  I'm not doing therapy today.")   Adi Seales Eli Hose 12/22/2019, 3:46 PM  Erasmo Leventhal , PTA Acute Rehabilitation Services Pager [phone] Office [phone]

## 2019-12-22 NOTE — Plan of Care (Signed)
  Problem: Education: Goal: Knowledge of General Education information will improve Description: Including pain rating scale, medication(s)/side effects and non-pharmacologic comfort measures Outcome: Progressing   Problem: Clinical Measurements: Goal: Ability to maintain clinical measurements within normal limits will improve Outcome: Progressing   

## 2019-12-22 NOTE — Plan of Care (Signed)

## 2019-12-22 NOTE — Progress Notes (Signed)
Physical Therapy Treatment Patient Details Name: Carl [name] MRN: [id number] DOB: 12-03-1935 Today's Date: 12/22/2019    History of Present Illness Patient presented to ER after fall at home. Has wound on left elbow and sacral wound.    PT Comments    Pt supine in bed on arrival.  PTA returned after patient called front desk asking PTA to return.  He apologized for being short earlier and was pleasant and agreeable to session.  Performed LE exercises and progressed gt distance.  He is making steady progress.  RN into assess sacral wound and give pain meds during session.  Pt seated in recliner on cushion post session.  Cont to recommend HHPT at d/c.      Follow Up Recommendations  Home health PT;Supervision for mobility/OOB;Supervision/Assistance - 24 hour     Equipment Recommendations       Recommendations for Other Services       Precautions / Restrictions Precautions Precautions: Fall Restrictions Weight Bearing Restrictions: No    Mobility  Bed Mobility Overal bed mobility: Needs Assistance Bed Mobility: Supine to Sit     Supine to sit: Mod assist     General bed mobility comments: Mod assistance for trunk elevation and LE advancement to edge of bed.  Pt required assistance to scoot forward.  Transfers Overall transfer level: Needs assistance Equipment used: Rolling walker (2 wheeled) Transfers: Sit to/from Stand Sit to Stand: Min assist         General transfer comment: Min assistance to boost into standing.  Cues for hand placement to and from seated surface.  Ambulation/Gait Ambulation/Gait assistance: Min guard Gait Distance (Feet): 70 Feet Assistive device: Rolling walker (2 wheeled) Gait Pattern/deviations: Step-through pattern;Trunk flexed;Decreased stride length Gait velocity: decreased   General Gait Details: Pt with flexed posture and B flexed knees,  Cues for upper trunk control and RW safety.  Able to advance distance  today.   Stairs             Wheelchair Mobility    Modified Rankin (Stroke Patients Only)       Balance Overall balance assessment: Needs assistance   Sitting balance-Leahy Scale: Fair       Standing balance-Leahy Scale: Fair Standing balance comment: reliant on RW and external support                            Cognition Arousal/Alertness: Awake/alert Behavior During Therapy: WFL for tasks assessed/performed Overall Cognitive Status: Within Functional Limits for tasks assessed                                        Exercises General Exercises - Lower Extremity Ankle Circles/Pumps: AROM;Both;20 reps;Supine Quad Sets: AROM;Both;10 reps;Supine Heel Slides: AROM;Both;10 reps;Supine Hip ABduction/ADduction: AROM;Both;10 reps;Supine    General Comments        Pertinent Vitals/Pain Pain Assessment: 0-10 Pain Score: 9  Pain Location: B arms> bottom. Pain Descriptors / Indicators: Aching Pain Intervention(s): Monitored during session;Repositioned    Home Living                      Prior Function            PT Goals (current goals can now be found in the care plan section) Acute Rehab PT Goals Patient Stated Goal: to decrease pain, feel better Potential to  Achieve Goals: Good Progress towards PT goals: Progressing toward goals    Frequency    Min 3X/week      PT Plan Current plan remains appropriate    Co-evaluation              AM-PAC PT "6 Clicks" Mobility   Outcome Measure  Help needed turning from your back to your side while in a flat bed without using bedrails?: A Little Help needed moving from lying on your back to sitting on the side of a flat bed without using bedrails?: A Lot Help needed moving to and from a bed to a chair (including a wheelchair)?: A Little Help needed standing up from a chair using your arms (e.g., wheelchair or bedside chair)?: A Little Help needed to walk in hospital  room?: A Little Help needed climbing 3-5 steps with a railing? : A Lot 6 Click Score: 16    End of Session Equipment Utilized During Treatment: Gait belt Activity Tolerance: Patient limited by fatigue;Patient limited by pain Patient left: in chair;with chair alarm set;with call bell/phone within reach Nurse Communication: Mobility status(informed nursing of condom cath) PT Visit Diagnosis: Unsteadiness on feet (R26.81);Difficulty in walking, not elsewhere classified (R26.2);Muscle weakness (generalized) (M62.81);Pain Pain - Right/Left: Left Pain - part of body: Shoulder;Arm     Time: [id number] PT Time Calculation (min) (ACUTE ONLY): 26 min  Charges:  $Gait Training: 8-22 mins $Therapeutic Activity: 8-22 mins                     Erasmo Leventhal , PTA Acute Rehabilitation Services Pager [phone] Office [phone]     Caedon Bond Eli Hose 12/22/2019, 5:31 PM

## 2019-12-22 NOTE — TOC Initial Note (Addendum)
Transition of Care [facility]) - Initial/Assessment Note    Patient Details  Name: Carl [name] MRN: [id number] Date of Birth: 05-Dec-1935  Transition of Care [facility]) CM/SW Contact:    Sharin Mons, RN Phone Number: 12/22/2019, 9:29 AM  Clinical Narrative:       Presents with increasing weakness, frequent falls @ home. Hx of metastatic prostate cancer on hormone therapy, paroxysmal A. fib on Eliquis, CKD stage II, chronic anemia, CAD status post CABG, falls. From home with daughter [name] and grandson. DME: w/c, rolling walker , 3 in 1/bsc.PTA required assistance with ADL's per daughter.   NCM received consult for possible SNF placement at time of discharge. NCM spoke with patient regarding PT recommendation of SNF placement at time of discharge. Patient reported that patient's daughter  is currently unable to care for patient at their home given patient's current physical needs and fall risk. Patient expressed understanding of PT recommendation and is ? agreeable to SNF placement at time of discharge. Home is pt's preference. Patient reports [facility]  Is 1st choice for SNF placement . NCM discussed insurance authorization process and provided Medicare SNF ratings list.  No further questions reported at this time. NCM to continue to follow and assist with discharge planning needs.  Pt without COVID vaccine. Last noted COVID 5/24.   Expected Discharge Plan: Skilled Nursing Facility Barriers to Discharge: Continued Medical Work up   Patient Goals and CMS Choice Patient states their goals for this hospitalization and ongoing recovery are:: I want to go home and CMS Medicare.gov Compare Post Acute Care list provided to:: Patient Represenative (must comment)(Erasmus ( son))    Expected Discharge Plan and Services Expected Discharge Plan: [place]   Discharge Planning Services: CM Consult   Living arrangements for the past 2 months: Single Family Home                                       Prior Living Arrangements/Services Living arrangements for the past 2 months: Single Family Home Lives with:: Adult Children Patient language and need for interpreter reviewed:: Yes        Need for Family Participation in Patient Care: Yes (Comment) Care giver support system in place?: Yes (comment)   Criminal Activity/Legal Involvement Pertinent to Current Situation/Hospitalization: No - Comment as needed  Activities of Daily Living Home Assistive Devices/Equipment: Walker (specify type) ADL Screening (condition at time of admission) Patient's cognitive ability adequate to safely complete daily activities?: Yes(A& O X3  UNSTEADY ON HIS FEET) Is the patient deaf or have difficulty hearing?: No Does the patient have difficulty seeing, even when wearing glasses/contacts?: No Does the patient have difficulty concentrating, remembering, or making decisions?: No Patient able to express need for assistance with ADLs?: Yes Does the patient have difficulty dressing or bathing?: No Independently performs ADLs?: Yes (appropriate for developmental age) Does the patient have difficulty walking or climbing stairs?: Yes Weakness of Legs: Both Weakness of Arms/Hands: None  Permission Sought/Granted Permission sought to share information with : Case Manager Permission granted to share information with : Yes, Verbal Permission Granted  Share Information with NAME: Hadley Pen. [phone]           Emotional Assessment Appearance:: Appears stated age Attitude/Demeanor/Rapport: Engaged Affect (typically observed): Accepting Orientation: : Oriented to Self, Oriented to Place, Oriented to  Time, Oriented to Situation   Psych Involvement: No (comment)  Admission diagnosis:  Dehydration [E86.0] Fall [W19.XXXA] Acute cystitis with hematuria [N30.01] Generalized weakness [R53.1] Near syncope [R55] Pyelonephritis [N12] Patient Active Problem List    Diagnosis Date Noted  . Pyelonephritis 12/20/2019  . Fall 12/19/2019  . Weight loss 08/30/2019  . Onychomycosis 08/30/2019  . AAA (abdominal aortic aneurysm) ([place]) 08/26/2018  . Kidney stones 06/30/2018  . Allergic rhinitis 04/26/2018  . Urolithiasis 01/05/2018  . Frequent urination 01/01/2018  . Colon polyps 09/15/2017  . GERD (gastroesophageal reflux disease) 09/15/2017  . Wrist pain, acute, right 05/25/2017  . Low back pain 03/24/2017  . Lung nodule seen on imaging study: 7 mm nodule left midlung 03/13/2017  . Physical deconditioning 03/13/2017  . Normocytic anemia 03/13/2017  . Fall at home, initial encounter 03/12/2017  . Abdominal soreness 03/12/2017  . Generalized weakness 03/12/2017  . Leukocytosis 03/12/2017  . Hyponatremia 03/12/2017  . Orthostatic hypotension 02/16/2017  . Anticoagulated 09/19/2016  . History of prostate cancer 09/04/2016  . Persistent atrial fibrillation 09/04/2016  . Cardiomyopathy, ischemic 07/16/2012  . Chronic kidney disease (CKD), stage III (moderate) ([place]) 07/16/2012  . Essential hypertension 08/05/2011  . Murmur 08/05/2011  . 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 Dyslipidemia 02/04/2011   PCP:  [name], MD Pharmacy:   [id number] - [place], [address] [place] [address] Phone: [phone] Fax: [phone]     Social Determinants of Health (SDOH) Interventions    Readmission Risk Interventions No flowsheet data found.

## 2019-12-22 NOTE — Progress Notes (Signed)
PROGRESS NOTE    [name]  [id number] DOB: [birth year] DOA: 12/19/2019 PCP: [name], MD    Brief Narrative:  Carl [name] is a 84 y.o. male with medical history significant of metastatic prostate cancer on hormone therapy, paroxysmal A. fib on Eliquis, CKD stage II, chronic anemia, CAD status post CABG, presented with increasing weakness, urinary frequency and frequent falls.  Patient has chronic ambulation dysfunction usually uses a walker to ambulate.  3 days ago patient started to have urinary symptoms with frequent urinations but no dysuria or hematuria.  Denies any fever chills.  Over the last 2 days he felt his appetite significant drop and very dehydrated.  From last night he started to feel lightheadedness and blurry vision every time he tried to stand up or move around.  He had 3 episodes of fall but denied any loss of consciousness or head injuries. He landed on left elbow and right hip where he had a total hip earlier this year. And he felt pain on left elbow and right hip after those falls.  In the ED, CT head and neck, x-ray of left elbow and pelvis reassuring. UA showed signs of UTI.   Assessment & Plan:   Active Problems:   Fall at home, initial encounter   Fall   Pyelonephritis   Pseudomonas UTI Urine culture with Pseudomonas that is pansensitive.  Was initially started on ceftriaxone, now change to fosfomycin to receive 2 doses per urine culture susceptibilites.  Falls with orthostatic hypotension Patient problems on home hospice, now rescinded.  Etiology likely secondary to hypovolemia from UTI and poor oral intake.  Received IV fluid hydration, on antibiotics as above.  PT/OT recommend SNF placement.  Hypomagnesemia Magnesium repleted IV on 12/20/2019. --Magnesium 2.0 today  BPH Metastatic prostate cancer status post brachytherapy Continues with hormone therapy Lupron every 4-6 months; PSH has remained stable. --Continue tamsulosin 0.4  mg p.o. daily --Continue outpatient follow-up with urology  Chronic kidney disease stage II Creatinine 1.30 on admission.  Improved with IV fluid hydration to 1.16.  Iron deficiency anemia Hemoglobin 9.3.  Iron 25, TIBC 287, ferritin 23.  Received dose of IV Feraheme on 12/20/2019. --started on ferrous sulfate 325 mg p.o. daily  Paroxysmal atrial fibrillation CHA2DS2-VASc score >4. --Continue amiodarone 2 mg p.o. daily --Continue anticoagulation with apixaban 5 mg p.o. twice daily  GERD: Continue Protonix 40 mg p.o. daily  Hyperlipidemia: Continue rosuvastatin 40 mg p.o. nightly  Constipation: --Senna 2 tabs twice daily --Bisacodyl suppository  Pressure wounds, present on admission Pressure Injury 10/09/18 Stage II -  Partial thickness loss of dermis presenting as a shallow open ulcer with a red, pink wound bed without slough. pt had this wound from home per his report; chronic partial thickness wounds related to shear, not pressur (Active)  10/09/18 0730  Location: Buttocks  Location Orientation: Left;Right  Staging: Stage II -  Partial thickness loss of dermis presenting as a shallow open ulcer with a red, pink wound bed without slough.  Wound Description (Comments): pt had this wound from home per his report; chronic partial thickness wounds related to shear, not pressure  Present on Admission: Yes    Debility/generalized weakness --Continue PT/OT efforts   DVT prophylaxis: Eliquis Code Status: DNR Family Communication: No family present at bedside  Disposition Plan:  Status is: Inpatient  Remains inpatient appropriate because:Unsafe d/c plan   Dispo: The patient is from: Home; living with daughter/grandson  Anticipated d/c is to: SNF              Anticipated d/c date is: 1 day              Patient currently is medically stable to d/c.   Consultants:   none  Procedures:   none  Antimicrobials:   Ceftriaxone 5/24 - 5/26  Fosfomycin  5/26   Subjective: Patient seen and examined bedside, resting comfortably. Continues with generalized weakness/fatigue.  No other complaints or concerns at this time.  Denies headache, no chest pain, palpitations, no abdominal pain, no fever/chills/night sweats, no nausea/vomiting/diarrhea.  No acute events overnight per nursing staff.  Objective: Vitals:   12/21/19 1251 12/21/19 2025 12/22/19 0416 12/22/19 0755  BP: 109/64 132/80 130/70 127/68  Pulse: 69 70 69 74  Resp: 14 16 16 15   Temp: 97.8 F (36.6 C) (!) 97.4 F (36.3 C) 97.7 F (36.5 C) 97.9 F (36.6 C)  TempSrc: Oral Oral Oral Oral  SpO2: 98% 97% 97% 97%  Weight:      Height:        Intake/Output Summary (Last 24 hours) at 12/22/2019 1352 Last data filed at 12/22/2019 0700 Gross per 24 hour  Intake 600 ml  Output 1100 ml  Net -500 ml   Filed Weights   12/19/19 0639  Weight: 65.3 kg    Examination:  General exam: Appears calm and comfortable  Respiratory system: Clear to auscultation. Respiratory effort normal.  Oxygenating well on room air Cardiovascular system: S1 & S2 heard, RRR. No JVD, murmurs, rubs, gallops or clicks. No pedal edema. Gastrointestinal system: Abdomen is nondistended, soft and nontender. No organomegaly or masses felt. Normal bowel sounds heard. Central nervous system: Alert and oriented. No focal neurological deficits. Extremities: Symmetric 5 x 5 power. Skin: No rashes, lesions or ulcers Psychiatry: Judgement and insight appear poor. Mood & affect appropriate.     Data Reviewed: I have personally reviewed following labs and imaging studies  CBC: Recent Labs  Lab 12/19/19 0804 12/20/19 0656 12/22/19 0453  WBC 6.5 6.1 3.9*  NEUTROABS 5.6  --   --   HGB 10.3* 9.3* 9.1*  HCT 33.1* 29.8* 29.3*  MCV 99.1 99.0 99.0  PLT 145* 131* [id number]*   Basic Metabolic Panel: Recent Labs  Lab 12/19/19 0804 12/20/19 0656 12/22/19 0453  NA 140 138 139  K 4.2 3.9 4.9  CL 106 108 107  CO2 24 22  25   GLUCOSE 132* 96 112*  BUN 20 17 24*  CREATININE 1.30* 1.16 1.11  CALCIUM 8.8* 8.4* 8.5*  MG  --  1.6* 2.0  PHOS  --  2.9  --    GFR: Estimated Creatinine Clearance: 47.4 mL/min (by C-G formula based on SCr of 1.11 mg/dL). Liver Function Tests: Recent Labs  Lab 12/19/19 0804  AST 24  ALT 31  ALKPHOS 61  BILITOT 0.6  PROT 6.1*  ALBUMIN 3.1*   No results for input(s): LIPASE, AMYLASE in the last 168 hours. No results for input(s): AMMONIA in the last 168 hours. Coagulation Profile: Recent Labs  Lab 12/19/19 0804  INR 1.4*   Cardiac Enzymes: Recent Labs  Lab 12/19/19 0804  CKTOTAL 201   BNP (last 3 results) No results for input(s): PROBNP in the last 8760 hours. HbA1C: No results for input(s): HGBA1C in the last 72 hours. CBG: No results for input(s): GLUCAP in the last 168 hours. Lipid Profile: No results for input(s): CHOL, HDL, LDLCALC, TRIG, CHOLHDL, LDLDIRECT in the  last 72 hours. Thyroid Function Tests: No results for input(s): TSH, T4TOTAL, FREET4, T3FREE, THYROIDAB in the last 72 hours. Anemia Panel: Recent Labs    12/19/19 1521  FERRITIN 23*  TIBC 287  IRON 25*   Sepsis Labs: No results for input(s): PROCALCITON, LATICACIDVEN in the last 168 hours.  Recent Results (from the past 240 hour(s))  Urine culture     Status: Abnormal   Collection Time: 12/19/19  9:46 AM   Specimen: Urine, Random  Result Value Ref Range Status   Specimen Description URINE, RANDOM  Final   Special Requests NONE  Final   Culture (A)  Final    >=100,000 COLONIES/mL PSEUDOMONAS AERUGINOSA Two isolates with different morphologies were identified as the same organism.The most resistant organism was reported. Performed at [facility], [place] [address] [place],  [address]    Report Status 12/21/2019 FINAL  Final   Organism ID, Bacteria PSEUDOMONAS AERUGINOSA (A)  Final      Susceptibility   Pseudomonas aeruginosa - MIC*    CEFTAZIDIME 4 SENSITIVE Sensitive      CIPROFLOXACIN <=0.25 SENSITIVE Sensitive     GENTAMICIN <=1 SENSITIVE Sensitive     IMIPENEM 2 SENSITIVE Sensitive     CEFEPIME 8 SENSITIVE Sensitive     * >=100,000 COLONIES/mL PSEUDOMONAS AERUGINOSA  SARS Coronavirus 2 by RT PCR (hospital order, performed in [place] hospital lab) Nasopharyngeal Nasopharyngeal Swab     Status: None   Collection Time: 12/19/19  9:47 AM   Specimen: Nasopharyngeal Swab  Result Value Ref Range Status   SARS Coronavirus 2 NEGATIVE NEGATIVE Final    Comment: (NOTE) SARS-CoV-2 target nucleic acids are NOT DETECTED. The SARS-CoV-2 RNA is generally detectable in upper and lower respiratory specimens during the acute phase of infection. The lowest concentration of SARS-CoV-2 viral copies this assay can detect is 250 copies / mL. A negative result does not preclude SARS-CoV-2 infection and should not be used as the sole basis for treatment or other patient management decisions.  A negative result may occur with improper specimen collection / handling, submission of specimen other than nasopharyngeal swab, presence of viral mutation(s) within the areas targeted by this assay, and inadequate number of viral copies (<250 copies / mL). A negative result must be combined with clinical observations, patient history, and epidemiological information. Fact Sheet for Patients:   StrictlyIdeas.no Fact Sheet for Healthcare Providers: BankingDealers.co.za This test is not yet approved or cleared  by the [place] FDA and has been authorized for detection and/or diagnosis of SARS-CoV-2 by FDA under an Emergency Use Authorization (EUA).  This EUA will remain in effect (meaning this test can be used) for the duration of the COVID-19 declaration under Section 564(b)(1) of the Act, 21 U.S.C. section 360bbb-3(b)(1), unless the authorization is terminated or revoked sooner. Performed at [facility], [place]  [address], [place],  [address]          Radiology Studies: No results found.      Scheduled Meds: . amiodarone  200 mg Oral Daily  . apixaban  5 mg Oral BID  . feeding supplement (ENSURE ENLIVE)  237 mL Oral BID BM  . ferrous sulfate  325 mg Oral Q breakfast  . fosfomycin  3 g Oral Q72H  . Gerhardt's butt cream   Topical QID  . multivitamin with minerals  1 tablet Oral Daily  . pantoprazole  40 mg Oral Daily  . polyethylene glycol  17 g Oral Daily  .  rosuvastatin  40 mg Oral Daily  . senna-docusate  2 tablet Oral BID  . tamsulosin  0.4 mg Oral QPC supper   Continuous Infusions: . ferumoxytol 510 mg (12/20/19 1807)     LOS: 2 days    Time spent: 34 minutes spent on chart review, discussion with nursing staff, consultants, updating family and interview/physical exam; more than 50% of that time was spent in counseling and/or coordination of care.    Taneshia Lorence J [place] DO [facility] Available via Epic secure chat 7am-7pm After these hours, please refer to coverage provider listed on [email or web address] 12/22/2019, 1:52 PM

## 2019-12-22 NOTE — Progress Notes (Signed)
Nutrition Follow-up  DOCUMENTATION CODES:   Severe malnutrition in context of chronic illness  INTERVENTION:   -Continue Ensure Enlive po BID, each supplement provides 350 kcal and 20 grams of protein -Continue MVI with minerals daily -Magic cup TID with meals, each supplement provides 290 kcal and 9 grams of protein  NUTRITION DIAGNOSIS:   Severe Malnutrition related to chronic illness(metastatic prostate cancer) as evidenced by moderate muscle depletion, severe muscle depletion, moderate fat depletion, severe fat depletion.  Ongoing  GOAL:   Patient will meet greater than or equal to 90% of their needs  Progressing   MONITOR:   PO intake, Supplement acceptance, Labs, Weight trends, Skin, I & O's  REASON FOR ASSESSMENT:   Malnutrition Screening Tool    ASSESSMENT:   Carl [name] is a 84 y.o. male with medical history significant of metastatic prostate cancer on hormone therapy, paroxysmal A. fib on Eliquis, CKD stage II, chronic anemia, CAD status post CABG, presented with increasing weakness, urinary frequency and frequent falls.  Patient has chronic ambulation dysfunction usually uses a walker to ambulate.  3 days ago patient started to have urinary symptoms with frequent urinations but no dysuria or hematuria.  Denies any fever chills.  Over the last 2 days he felt his appetite significant drop and very dehydrated.  From last night he started to feel lightheadedness and blurry vision every time he tried to stand up or move around.  He had 3 episodes of fall but denied any loss of consciousness or head injuries. He landed on left elbow and right hip where he had a total hip earlier this year. And he felt pain on left elbow and right hip after those falls.  Reviewed I/O's: -600 ml x 24 hours and +1 L since admission  UOP: 1.3 L x 24 hours  Spoke with pt at bedside, who reports feeling better today. He reports his appetite is fair, however, very poor PTA. He states "I  jut didn't want it". Pt consumes 3 meals per day, prepared by daughter; frequently consumed foods include pot roast, hamburgers, and hot dogs. Pt estimates that over the past couple of weeks, intake has decreased. Noted meal completion documented at 30-50%.   Per pt, he reports he has lost about 0000000, however, uncertain of time frame of UBW. However, wt has been stable over the past year.   Per [facility] team notes, plan for SNF placement.   Medications reviewed and include miralax.   Labs reviewed.   NUTRITION - FOCUSED PHYSICAL EXAM:    Most Recent Value  Orbital Region  Moderate depletion  Upper Arm Region  Severe depletion  Thoracic and Lumbar Region  Moderate depletion  Buccal Region  Severe depletion  Temple Region  Moderate depletion  Clavicle Bone Region  Severe depletion  Clavicle and Acromion Bone Region  Severe depletion  Scapular Bone Region  Severe depletion  Dorsal Hand  Severe depletion  Patellar Region  Severe depletion  Anterior Thigh Region  Severe depletion  Posterior Calf Region  Severe depletion  Edema (RD Assessment)  None  Hair  Reviewed  Eyes  Reviewed  Mouth  Reviewed  Skin  Reviewed  Nails  Reviewed       Diet Order:   Diet Order            Diet Heart Room service appropriate? No; Fluid consistency: Thin  Diet effective now              EDUCATION NEEDS:  Education needs have been addressed  Skin:  Skin Assessment: Skin Integrity Issues: Skin Integrity Issues:: Other (Comment) Other: full thickness skin tear (avulsion) to left elbow, chronic tissue injury (moisture plus friction) to bilateral buttocks with purple discoloration  Last BM:  12/20/19  Height:   Ht Readings from Last 1 Encounters:  12/19/19 5\' 10"  (1.778 m)    Weight:   Wt Readings from Last 1 Encounters:  12/19/19 65.3 kg    Ideal Body Weight:  75.5 kg  BMI:  Body mass index is 20.66 kg/m.  Estimated Nutritional Needs:   Kcal:  1950-2150  Protein:  95-110  grams  Fluid:  > 1.9 L    Loistine Chance, RD, LDN, [place] Registered Dietitian II Certified Diabetes Care and Education Specialist Please refer to [facility] for RD and/or RD on-call/weekend/after hours pager

## 2019-12-22 NOTE — Progress Notes (Signed)
Son: Deshone Frieder. ([place]) [phone] at the bedside. Spoke with patient regarding SNF/Rehab placement. Patient agreed to go [facility]. Will f/u with day shift Nurse and Leave a message for Case Manager to follow up with Son.

## 2019-12-23 DIAGNOSIS — E43 Unspecified severe protein-calorie malnutrition: Secondary | ICD-10-CM

## 2019-12-23 LAB — SARS CORONAVIRUS 2 BY RT PCR (HOSPITAL ORDER, PERFORMED IN ~~LOC~~ HOSPITAL LAB): SARS Coronavirus 2: NEGATIVE

## 2019-12-23 MED ORDER — POLYETHYLENE GLYCOL 3350 17 G PO PACK: 17.0000 g | PACK | Freq: Every day | ORAL | 0 refills | Status: DC

## 2019-12-23 MED ORDER — SENNOSIDES-DOCUSATE SODIUM 8.6-50 MG PO TABS: 2.0000 | ORAL_TABLET | Freq: Two times a day (BID) | ORAL | 0 refills | Status: AC

## 2019-12-23 MED ORDER — ADULT MULTIVITAMIN W/MINERALS CH: 1.0000 | ORAL_TABLET | Freq: Every day | ORAL | Status: DC

## 2019-12-23 MED ORDER — GERHARDT'S BUTT CREAM: 1.0000 "application " | TOPICAL_CREAM | Freq: Four times a day (QID) | CUTANEOUS | Status: DC

## 2019-12-23 MED ORDER — ENSURE ENLIVE PO LIQD: 237.0000 mL | Freq: Two times a day (BID) | ORAL | 12 refills | Status: DC

## 2019-12-23 MED ORDER — FERROUS SULFATE 325 (65 FE) MG PO TABS: 325.0000 mg | ORAL_TABLET | Freq: Every day | ORAL | 0 refills | Status: DC

## 2019-12-23 MED ORDER — FOSFOMYCIN TROMETHAMINE 3 G PO PACK: 3.0000 g | PACK | ORAL | 0 refills | Status: AC

## 2019-12-23 NOTE — Plan of Care (Signed)
  Problem: Clinical Measurements: Goal: Diagnostic test results will improve Outcome: Progressing   Problem: Activity: Goal: Risk for activity intolerance will decrease Outcome: Progressing   Problem: Nutrition: Goal: Adequate nutrition will be maintained Outcome: Progressing   Problem: Elimination: Goal: Will not experience complications related to bowel motility Outcome: Progressing   Problem: Safety: Goal: Ability to remain free from injury will improve Outcome: Progressing   Problem: Skin Integrity: Goal: Risk for impaired skin integrity will decrease Outcome: Progressing

## 2019-12-23 NOTE — Plan of Care (Signed)
  Problem: Health Behavior/Discharge Planning: Goal: Ability to manage health-related needs will improve Outcome: Progressing   Problem: Activity: Goal: Risk for activity intolerance will decrease Outcome: Progressing   

## 2019-12-23 NOTE — Discharge Summary (Signed)
Physician Discharge Summary  Carl [name] [id number] DOB: 02/10/1936 DOA: 12/19/2019  PCP: [name], MD  Admit date: 12/19/2019 Discharge date: 12/23/2019  Admitted From: Home Disposition: [facility]   Recommendations for Outpatient Follow-up:  1. Follow up with PCP in 1-2 weeks 2. Started on ferrous sulfate for iron deficiency anemia 3. Continue fosfomycin, next dose on 12/24/2019 for urinary tract infection  Home Health: No Equipment/Devices: None  Discharge Condition: Stable CODE STATUS: DNR Diet recommendation: Heart healthy diet  History of present illness:  Carl [name] is a 84 y.o.malewith medical history significant ofmetastatic prostate cancer on hormone therapy, paroxysmal A. fib on Eliquis, CKD stage II, chronic anemia, CAD status post CABG, presented with increasing weakness, urinary frequency and frequent falls. Patient has chronic ambulation dysfunction usually uses a walker to ambulate. 3 days ago patient started to have urinary symptoms with frequent urinations but no dysuria or hematuria. Denies any fever chills. Over the last 2 days he felt his appetite significant drop and very dehydrated. From last night he started to feel lightheadedness and blurry visionevery time he tried to stand up or move around. He had 3 episodes of fallbut denied any loss of consciousness or head injuries. He landed on left elbow and right hip where he had a total hip earlier this year. And he felt pain on left elbow and right hip after those falls.  In the ED, CT head and neck, x-ray of left elbow and pelvisreassuring. UA showed signs of UTI.  Hospital course:  Pseudomonas UTI Urine culture with Pseudomonas that is pansensitive.  Was initially started on ceftriaxone, now changed to fosfomycin to receive 2 doses per urine culture susceptibilites; last dose on 12/24/2019.  Falls with orthostatic hypotension Patient previous on home hospice, now rescinded.   Etiology likely secondary to hypovolemia from UTI and poor oral intake.  Received IV fluid hydration, on antibiotics as above.    Discharging to SNF.  Hypomagnesemia Repleted during hospitalization.  BPH Metastatic prostate cancer status post brachytherapy Continues with hormone therapy Lupron every 4-6 months; PSH has remained stable. Continue tamsulosin 0.4 mg p.o. daily. Continue outpatient follow-up with urology.  Acute on chronic kidney disease stage II Creatinine 1.30 on admission.  Improved with IV fluid hydration to 1.11.  Iron deficiency anemia Hemoglobin 9.3.  Iron 25, TIBC 287, ferritin 23.  Received dose of IV Feraheme on 12/20/2019.Started on ferrous sulfate 325 mg p.o. daily  Paroxysmal atrial fibrillation CHA2DS2-VASc score >4. Continue amiodarone 200 mg p.o. daily. Continue anticoagulation with apixaban 5 mg p.o. twice daily  GERD: Continue Protonix 40 mg p.o. daily  Hyperlipidemia: Continue rosuvastatin 40 mg p.o. nightly  Constipation: Senna 2 tabs twice daily, MiraLAX  Pressure wounds, present on admission Pressure Injury 10/09/18 Stage II -  Partial thickness loss of dermis presenting as a shallow open ulcer with a red, pink wound bed without slough. pt had this wound from home per his report; chronic partial thickness wounds related to shear, not pressur (Active)  10/09/18 0730  Location: Buttocks  Location Orientation: Left;Right  Staging: Stage II -  Partial thickness loss of dermis presenting as a shallow open ulcer with a red, pink wound bed without slough.  Wound Description (Comments): pt had this wound from home per his report; chronic partial thickness wounds related to shear, not pressure  Present on Admission: Yes   Debility/generalized weakness: Continue PT/OT efforts at SNF   Discharge Diagnoses:  Active Problems:   Fall at home, initial encounter  Fall   Pyelonephritis   Protein-calorie malnutrition, severe    Discharge  Instructions  Discharge Instructions    Call MD for:  difficulty breathing, headache or visual disturbances   Complete by: As directed    Call MD for:  extreme fatigue   Complete by: As directed    Call MD for:  persistant dizziness or light-headedness   Complete by: As directed    Call MD for:  persistant nausea and vomiting   Complete by: As directed    Call MD for:  severe uncontrolled pain   Complete by: As directed    Call MD for:  temperature >100.4   Complete by: As directed    Diet - low sodium heart healthy   Complete by: As directed    Increase activity slowly   Complete by: As directed      Allergies as of 12/23/2019      Reactions   Ciprofloxacin Other (See Comments)   PER HEART DR   Ibuprofen Other (See Comments)   Kidney issues      Medication List    STOP taking these medications   ALPRAZolam 0.25 MG tablet Commonly known as: XANAX     TAKE these medications   amiodarone 200 MG tablet Commonly known as: PACERONE Take 1 tablet (200 mg total) by mouth daily. Needs office visit for further refills. 3RD and final attempt   apixaban 5 MG Tabs tablet Commonly known as: Eliquis Take 1 tablet (5 mg total) by mouth 2 (two) times daily.   feeding supplement (ENSURE ENLIVE) Liqd Take 237 mLs by mouth 2 (two) times daily between meals.   ferrous sulfate 325 (65 FE) MG tablet Take 1 tablet (325 mg total) by mouth daily with breakfast. Start taking on: Dec 24, 2019   fosfomycin 3 g Pack Commonly known as: MONUROL Take 3 g by mouth every 3 (three) days for 1 dose. Start taking on: Dec 24, 2019   Gerhardt's butt cream Crea Apply 1 application topically 4 (four) times daily.   loratadine 10 MG tablet Commonly known as: CLARITIN Take 1 tablet (10 mg total) by mouth daily. What changed:   when to take this  reasons to take this   multivitamin with minerals Tabs tablet Take 1 tablet by mouth daily. Start taking on: Dec 24, 2019   omeprazole 40 MG  capsule Commonly known as: PRILOSEC TAKE 1 CAPSULE BY MOUTH EVERY DAY   polyethylene glycol 17 g packet Commonly known as: MIRALAX / GLYCOLAX Take 17 g by mouth daily. Start taking on: Dec 24, 2019   rosuvastatin 40 MG tablet Commonly known as: CRESTOR Take 1 tablet (40 mg total) by mouth daily.   senna-docusate 8.6-50 MG tablet Commonly known as: Senokot-S Take 2 tablets by mouth 2 (two) times daily.   tamsulosin 0.4 MG Caps capsule Commonly known as: FLOMAX Take 1 capsule (0.4 mg total) by mouth daily after supper.   TYLENOL 500 MG tablet Generic drug: acetaminophen Take 1,000 mg by mouth every 6 (six) hours as needed for mild pain (for pain).      Follow-up Information    [name], MD. Schedule an appointment as soon as possible for a visit in 1 week(s).   Specialty: Family Medicine Contact information: [place] [address] [phone]        Minna Merritts, MD .   Specialty: Cardiology Contact information: [place] [address] [phone]  Allergies  Allergen Reactions  . Ciprofloxacin Other (See Comments)    PER HEART DR  . Ibuprofen Other (See Comments)    Kidney issues    Consultations:  None   Procedures/Studies: DG Chest 2 View  Result Date: 12/19/2019 CLINICAL DATA:  Cough.  Recent fall EXAM: CHEST - 2 VIEW COMPARISON:  March 08, 2019 FINDINGS: Lungs are clear. Heart size and pulmonary vascularity are normal. No adenopathy. Patient is status post coronary artery bypass grafting. There is aortic atherosclerosis. Bones are osteoporotic. There old healed fractures of several ribs on the right. No pneumothorax. IMPRESSION: Lungs clear. Stable cardiac silhouette. Postoperative changes. Bones osteoporotic. No pneumothorax. Aortic Atherosclerosis (ICD10-I70.0). Electronically Signed   By: Lowella Grip III M.D.   On: 12/19/2019 08:01   DG Elbow Complete  Left  Result Date: 12/19/2019 CLINICAL DATA:  Pain following fall EXAM: LEFT ELBOW - COMPLETE 3+ VIEW COMPARISON:  None. FINDINGS: Frontal, lateral, and bilateral oblique views were obtained. Bones are osteoporotic. There is no appreciable fracture or dislocation. There is no appreciable joint space narrowing. There is chondrocalcinosis. No erosive change. IMPRESSION: Osteoporosis. No fracture or dislocation. No significant joint space narrowing. Chondrocalcinosis is present, a finding that may be seen with osteoarthritis or with calcium pyrophosphate deposition disease. Electronically Signed   By: Lowella Grip III M.D.   On: 12/19/2019 08:02   CT Head Wo Contrast  Result Date: 12/19/2019 CLINICAL DATA:  Fall. Dizziness. EXAM: CT HEAD WITHOUT CONTRAST CT CERVICAL SPINE WITHOUT CONTRAST TECHNIQUE: Multidetector CT imaging of the head and cervical spine was performed following the standard protocol without intravenous contrast. Multiplanar CT image reconstructions of the cervical spine were also generated. COMPARISON:  02/25/2019. FINDINGS: CT HEAD FINDINGS Brain: No evidence of acute infarction, hemorrhage, hydrocephalus, extra-axial collection or mass lesion/mass effect. There is mild diffuse low-attenuation within the subcortical and periventricular white matter compatible with chronic microvascular disease. Prominence of the sulci and ventricles compatible with brain atrophy. Unchanged left frontal region extra-axial mass containing calcifications measuring 2.5 x 0.9 cm and compatible with a meningioma. Vascular: No hyperdense vessel or unexpected calcification. Skull: Normal. Negative for fracture or focal lesion. Sinuses/Orbits: Opacification of the left anterior ethmoid air cells and frontal sinus identified. Other: None. CT CERVICAL SPINE FINDINGS Alignment: Normal. Skull base and vertebrae: No acute fracture. No primary bone lesion or focal pathologic process. Soft tissues and spinal canal: No  prevertebral fluid or swelling. No visible canal hematoma. Disc levels: Multi level disc space narrowing and endplate spurring identified. This is most advanced at C6-7. Upper chest: Negative Other: None IMPRESSION: 1. No acute intracranial abnormalities. 2. Chronic small vessel ischemic change and brain atrophy. 3. Stable left frontal region meningioma. 4. No evidence for cervical spine fracture. 5. Cervical spondylosis. Electronically Signed   By: Kerby Moors M.D.   On: 12/19/2019 08:46   CT Cervical Spine Wo Contrast  Result Date: 12/19/2019 CLINICAL DATA:  Fall. Dizziness. EXAM: CT HEAD WITHOUT CONTRAST CT CERVICAL SPINE WITHOUT CONTRAST TECHNIQUE: Multidetector CT imaging of the head and cervical spine was performed following the standard protocol without intravenous contrast. Multiplanar CT image reconstructions of the cervical spine were also generated. COMPARISON:  02/25/2019. FINDINGS: CT HEAD FINDINGS Brain: No evidence of acute infarction, hemorrhage, hydrocephalus, extra-axial collection or mass lesion/mass effect. There is mild diffuse low-attenuation within the subcortical and periventricular white matter compatible with chronic microvascular disease. Prominence of the sulci and ventricles compatible with brain atrophy. Unchanged left frontal region extra-axial mass containing calcifications measuring  2.5 x 0.9 cm and compatible with a meningioma. Vascular: No hyperdense vessel or unexpected calcification. Skull: Normal. Negative for fracture or focal lesion. Sinuses/Orbits: Opacification of the left anterior ethmoid air cells and frontal sinus identified. Other: None. CT CERVICAL SPINE FINDINGS Alignment: Normal. Skull base and vertebrae: No acute fracture. No primary bone lesion or focal pathologic process. Soft tissues and spinal canal: No prevertebral fluid or swelling. No visible canal hematoma. Disc levels: Multi level disc space narrowing and endplate spurring identified. This is most  advanced at C6-7. Upper chest: Negative Other: None IMPRESSION: 1. No acute intracranial abnormalities. 2. Chronic small vessel ischemic change and brain atrophy. 3. Stable left frontal region meningioma. 4. No evidence for cervical spine fracture. 5. Cervical spondylosis. Electronically Signed   By: Kerby Moors M.D.   On: 12/19/2019 08:46   DG Hips Bilat W or Wo Pelvis 3-4 Views  Result Date: 12/19/2019 CLINICAL DATA:  Pain following fall EXAM: DG HIP (WITH OR WITHOUT PELVIS) 3-4V BILAT COMPARISON:  March 02, 2019 FINDINGS: Frontal pelvis as well as frontal and lateral views of each hip joint obtained. There is a total hip replacement on the right with prosthetic components well seated. There is underlying osteoporosis. No acute fracture or dislocation. There is mild narrowing of the left hip joint. There is a suspected bone island in the supra-acetabular region on the right. Sclerosis is noted in the intertrochanteric region of the proximal left femur, stable. Seed implants are noted in the prostate. IMPRESSION: No acute fracture or dislocation. Total hip replacement on the right with prosthetic components well-seated. Underlying osteoporosis. Probable bone island in the supra-acetabular region on the right, a stable finding. Sclerotic area in the intertrochanteric region on the left, stable. This area has a chondroid type matrix and may represent an enchondroma or bone infarct. Neoplastic focus felt to be less likely. Seed implants noted in prostate. Electronically Signed   By: Lowella Grip III M.D.   On: 12/19/2019 08:05      Subjective: Patient seen and examined bedside, resting comfortably.  No complaints this morning.  Ready for discharge to SNF.  Denies headache, no chest pain, no palpitations, no shortness of breath, no abdominal pain.  No acute events overnight per nursing staff.  Discharge Exam: Vitals:   12/22/19 2129 12/23/19 0515  BP: 134/75 130/72  Pulse: 73 71  Resp: 16 15   Temp: 97.7 F (36.5 C) 98 F (36.7 C)  SpO2: 96% 97%   Vitals:   12/22/19 0755 12/22/19 1409 12/22/19 2129 12/23/19 0515  BP: 127/68 138/80 134/75 130/72  Pulse: 74 70 73 71  Resp: 15  16 15   Temp: 97.9 F (36.6 C) 98.2 F (36.8 C) 97.7 F (36.5 C) 98 F (36.7 C)  TempSrc: Oral Oral Oral Oral  SpO2: 97% 98% 96% 97%  Weight:      Height:        General: Pt is alert, awake, not in acute distress, elderly in appearance Cardiovascular: RRR, S1/S2 +, no rubs, no gallops Respiratory: CTA bilaterally, no wheezing, no rhonchi Abdominal: Soft, NT, ND, bowel sounds + Extremities: no edema, no cyanosis    The results of significant diagnostics from this hospitalization (including imaging, microbiology, ancillary and laboratory) are listed below for reference.     Microbiology: Recent Results (from the past 240 hour(s))  Urine culture     Status: Abnormal   Collection Time: 12/19/19  9:46 AM   Specimen: Urine, Random  Result Value Ref Range  Status   Specimen Description URINE, RANDOM  Final   Special Requests NONE  Final   Culture (A)  Final    >=100,000 COLONIES/mL PSEUDOMONAS AERUGINOSA Two isolates with different morphologies were identified as the same organism.The most resistant organism was reported. Performed at [facility], [place] [address], [place], [place] [address]    Report Status 12/21/2019 FINAL  Final   Organism ID, Bacteria PSEUDOMONAS AERUGINOSA (A)  Final      Susceptibility   Pseudomonas aeruginosa - MIC*    CEFTAZIDIME 4 SENSITIVE Sensitive     CIPROFLOXACIN <=0.25 SENSITIVE Sensitive     GENTAMICIN <=1 SENSITIVE Sensitive     IMIPENEM 2 SENSITIVE Sensitive     CEFEPIME 8 SENSITIVE Sensitive     * >=100,000 COLONIES/mL PSEUDOMONAS AERUGINOSA  SARS Coronavirus 2 by RT PCR (hospital order, performed in [facility] hospital lab) Nasopharyngeal Nasopharyngeal Swab     Status: None   Collection Time: 12/19/19  9:47 AM   Specimen: Nasopharyngeal Swab   Result Value Ref Range Status   SARS Coronavirus 2 NEGATIVE NEGATIVE Final    Comment: (NOTE) SARS-CoV-2 target nucleic acids are NOT DETECTED. The SARS-CoV-2 RNA is generally detectable in upper and lower respiratory specimens during the acute phase of infection. The lowest concentration of SARS-CoV-2 viral copies this assay can detect is 250 copies / mL. A negative result does not preclude SARS-CoV-2 infection and should not be used as the sole basis for treatment or other patient management decisions.  A negative result may occur with improper specimen collection / handling, submission of specimen other than nasopharyngeal swab, presence of viral mutation(s) within the areas targeted by this assay, and inadequate number of viral copies (<250 copies / mL). A negative result must be combined with clinical observations, patient history, and epidemiological information. Fact Sheet for Patients:   StrictlyIdeas.no Fact Sheet for Healthcare Providers: BankingDealers.co.za This test is not yet approved or cleared  by the [place] FDA and has been authorized for detection and/or diagnosis of SARS-CoV-2 by FDA under an Emergency Use Authorization (EUA).  This EUA will remain in effect (meaning this test can be used) for the duration of the COVID-19 declaration under Section 564(b)(1) of the Act, 21 U.S.C. section 360bbb-3(b)(1), unless the authorization is terminated or revoked sooner. Performed at [facility], [place] [address] [place], [place] [address]      Labs: BNP (last 3 results) No results for input(s): BNP in the last 8760 hours. Basic Metabolic Panel: Recent Labs  Lab 12/19/19 0804 12/20/19 0656 12/22/19 0453  NA 140 138 139  K 4.2 3.9 4.9  CL 106 108 107  CO2 24 22 25   GLUCOSE 132* 96 112*  BUN 20 17 24*  CREATININE 1.30* 1.16 1.11  CALCIUM 8.8* 8.4* 8.5*  MG  --  1.6* 2.0  PHOS  --  2.9  --    Liver  Function Tests: Recent Labs  Lab 12/19/19 0804  AST 24  ALT 31  ALKPHOS 61  BILITOT 0.6  PROT 6.1*  ALBUMIN 3.1*   No results for input(s): LIPASE, AMYLASE in the last 168 hours. No results for input(s): AMMONIA in the last 168 hours. CBC: Recent Labs  Lab 12/19/19 0804 12/20/19 0656 12/22/19 0453  WBC 6.5 6.1 3.9*  NEUTROABS 5.6  --   --   HGB 10.3* 9.3* 9.1*  HCT 33.1* 29.8* 29.3*  MCV 99.1 99.0 99.0  PLT 145* 131* 136*   Cardiac Enzymes:  Recent Labs  Lab 12/19/19 0804  CKTOTAL 201   BNP: Invalid input(s): POCBNP CBG: No results for input(s): GLUCAP in the last 168 hours. D-Dimer No results for input(s): DDIMER in the last 72 hours. Hgb A1c No results for input(s): HGBA1C in the last 72 hours. Lipid Profile No results for input(s): CHOL, HDL, LDLCALC, TRIG, CHOLHDL, LDLDIRECT in the last 72 hours. Thyroid function studies No results for input(s): TSH, T4TOTAL, T3FREE, THYROIDAB in the last 72 hours.  Invalid input(s): FREET3 Anemia work up No results for input(s): VITAMINB12, FOLATE, FERRITIN, TIBC, IRON, RETICCTPCT in the last 72 hours. Urinalysis    Component Value Date/Time   COLORURINE YELLOW 12/19/2019 0950   APPEARANCEUR HAZY (A) 12/19/2019 0950   LABSPEC 1.017 12/19/2019 0950   PHURINE 5.0 12/19/2019 0950   GLUCOSEU NEGATIVE 12/19/2019 0950   HGBUR LARGE (A) 12/19/2019 0950   BILIRUBINUR NEGATIVE 12/19/2019 0950   BILIRUBINUR mod 05/11/2019 0928   KETONESUR NEGATIVE 12/19/2019 0950   PROTEINUR 30 (A) 12/19/2019 0950   UROBILINOGEN 0.2 05/11/2019 0928   NITRITE POSITIVE (A) 12/19/2019 0950   LEUKOCYTESUR LARGE (A) 12/19/2019 0950   Sepsis Labs Invalid input(s): PROCALCITONIN,  WBC,  LACTICIDVEN Microbiology Recent Results (from the past 240 hour(s))  Urine culture     Status: Abnormal   Collection Time: 12/19/19  9:46 AM   Specimen: Urine, Random  Result Value Ref Range Status   Specimen Description URINE, RANDOM  Final   Special  Requests NONE  Final   Culture (A)  Final    >=100,000 COLONIES/mL PSEUDOMONAS AERUGINOSA Two isolates with different morphologies were identified as the same organism.The most resistant organism was reported. Performed at [facility], [place] [address] [place], [place] [address]    Report Status 12/21/2019 FINAL  Final   Organism ID, Bacteria PSEUDOMONAS AERUGINOSA (A)  Final      Susceptibility   Pseudomonas aeruginosa - MIC*    CEFTAZIDIME 4 SENSITIVE Sensitive     CIPROFLOXACIN <=0.25 SENSITIVE Sensitive     GENTAMICIN <=1 SENSITIVE Sensitive     IMIPENEM 2 SENSITIVE Sensitive     CEFEPIME 8 SENSITIVE Sensitive     * >=100,000 COLONIES/mL PSEUDOMONAS AERUGINOSA  SARS Coronavirus 2 by RT PCR (hospital order, performed in [facility] hospital lab) Nasopharyngeal Nasopharyngeal Swab     Status: None   Collection Time: 12/19/19  9:47 AM   Specimen: Nasopharyngeal Swab  Result Value Ref Range Status   SARS Coronavirus 2 NEGATIVE NEGATIVE Final    Comment: (NOTE) SARS-CoV-2 target nucleic acids are NOT DETECTED. The SARS-CoV-2 RNA is generally detectable in upper and lower respiratory specimens during the acute phase of infection. The lowest concentration of SARS-CoV-2 viral copies this assay can detect is 250 copies / mL. A negative result does not preclude SARS-CoV-2 infection and should not be used as the sole basis for treatment or other patient management decisions.  A negative result may occur with improper specimen collection / handling, submission of specimen other than nasopharyngeal swab, presence of viral mutation(s) within the areas targeted by this assay, and inadequate number of viral copies (<250 copies / mL). A negative result must be combined with clinical observations, patient history, and epidemiological information. Fact Sheet for Patients:   StrictlyIdeas.no Fact Sheet for Healthcare  Providers: BankingDealers.co.za This test is not yet approved or cleared  by the [place] FDA and has been authorized for detection and/or diagnosis of SARS-CoV-2 by FDA under an Emergency Use Authorization (EUA).  This EUA will remain in effect (meaning this test can be used) for the duration of the COVID-19 declaration under Section 564(b)(1) of the Act, 21 U.S.C. section 360bbb-3(b)(1), unless the authorization is terminated or revoked sooner. Performed at [facility], [place] [address] [place], [place] [address]      Time coordinating discharge: Over 30 minutes  SIGNED:   Kinisha Soper J [place] DO  [facility] 12/23/2019, 9:36 AM

## 2019-12-23 NOTE — Progress Notes (Signed)
Physical Therapy Treatment Patient Details Name: Carl [name] MRN: [id number] DOB: Jul 16, 1936 Today's Date: 12/23/2019    History of Present Illness Patient presented to ER after fall at home. Has wound on left elbow and sacral wound.    PT Comments    Pt supine in bed this session.  He reports his back is itching.  Once he sat up applied lotion.  Pt performed supine LE strengthening and progression of gt training.  Plan to d/c to snf this pm.  Pt continues to make progress as evident by functional gains.     Follow Up Recommendations  Home health PT;Supervision for mobility/OOB;Supervision/Assistance - 24 hour     Equipment Recommendations  None recommended by PT    Recommendations for Other Services       Precautions / Restrictions Precautions Precautions: Fall Restrictions Weight Bearing Restrictions: No    Mobility  Bed Mobility Overal bed mobility: Needs Assistance Bed Mobility: Supine to Sit     Supine to sit: Mod assist     General bed mobility comments: Mod assistance for trunk elevation and LE advancement to edge of bed.  Pt required assistance to scoot forward.  Transfers Overall transfer level: Needs assistance Equipment used: Rolling walker (2 wheeled) Transfers: Sit to/from Stand Sit to Stand: Supervision;From elevated surface         General transfer comment: Pt able to move into standing without physical assistance this session.  Ambulation/Gait Ambulation/Gait assistance: Min guard Gait Distance (Feet): 80 Feet Assistive device: Rolling walker (2 wheeled) Gait Pattern/deviations: Step-through pattern;Trunk flexed;Decreased stride length Gait velocity: decreased   General Gait Details: Pt with flexed posture and B flexed knees,  Cues for upper trunk control and RW safety.  Able to advance distance today.   Stairs             Wheelchair Mobility    Modified Rankin (Stroke Patients Only)       Balance Overall balance  assessment: Needs assistance Sitting-balance support: Feet supported Sitting balance-Leahy Scale: Fair       Standing balance-Leahy Scale: Fair                              Cognition Arousal/Alertness: Awake/alert Behavior During Therapy: WFL for tasks assessed/performed Overall Cognitive Status: Within Functional Limits for tasks assessed                                        Exercises General Exercises - Lower Extremity Ankle Circles/Pumps: AROM;Both;20 reps;Supine Quad Sets: AROM;Both;10 reps;Supine Heel Slides: AROM;Both;10 reps;Supine Hip ABduction/ADduction: AROM;Both;10 reps;Supine    General Comments        Pertinent Vitals/Pain Pain Assessment: 0-10 Pain Score: 9  Faces Pain Scale: Hurts little more Pain Location: B arms> bottom. Pain Descriptors / Indicators: Aching Pain Intervention(s): Monitored during session;Repositioned    Home Living                      Prior Function            PT Goals (current goals can now be found in the care plan section) Acute Rehab PT Goals Patient Stated Goal: to decrease pain, feel better Potential to Achieve Goals: Good Progress towards PT goals: Progressing toward goals    Frequency    Min 3X/week      PT Plan  Current plan remains appropriate    Co-evaluation              AM-PAC PT "6 Clicks" Mobility   Outcome Measure  Help needed turning from your back to your side while in a flat bed without using bedrails?: A Little Help needed moving from lying on your back to sitting on the side of a flat bed without using bedrails?: A Lot Help needed moving to and from a bed to a chair (including a wheelchair)?: A Little Help needed standing up from a chair using your arms (e.g., wheelchair or bedside chair)?: A Little Help needed to walk in hospital room?: A Little Help needed climbing 3-5 steps with a railing? : A Little 6 Click Score: 17    End of Session Equipment  Utilized During Treatment: Gait belt Activity Tolerance: Patient limited by fatigue Patient left: in chair;with chair alarm set;with call bell/phone within reach Nurse Communication: Mobility status PT Visit Diagnosis: Unsteadiness on feet (R26.81);Difficulty in walking, not elsewhere classified (R26.2);Muscle weakness (generalized) (M62.81);Pain Pain - Right/Left: Left Pain - part of body: Shoulder;Arm     Time: [id number] PT Time Calculation (min) (ACUTE ONLY): 24 min  Charges:  $Gait Training: 8-22 mins $Therapeutic Exercise: 8-22 mins                     Erasmo Leventhal , PTA Acute Rehabilitation Services Pager [phone] Office [phone]     Diantha Paxson Eli Hose 12/23/2019, 10:50 AM

## 2019-12-23 NOTE — Progress Notes (Signed)
RN called report to [place] at [facility]. All questions answered. Belongings gathered to be sent with him. PTAR to transport.

## 2019-12-23 NOTE — TOC Transition Note (Addendum)
Transition of Care [facility]) - CM/SW Discharge Note   Patient Details  Name: Carl [name] MRN: [id number] Date of Birth: Dec 13, 1935  [facility] CM/SW Contact:  Sharin Mons, RN Phone Number: 12/23/2019, 9:23 AM   Clinical Narrative:     Patient will DC to: [facility] Anticipated DC date: 12/23/2019 Family notified: Juanda Crumble Transport by: Corey Harold   Per MD patient ready for DC to [facility] . RN, patient, patient's family, and facility notified of DC. Discharge Summary and FL2 sent to facility. RN to call report prior to discharge [phone]). Rm 304 B.DC packet on chart. Ambulance transport requested for patient.   RNCM will sign off for now as intervention is no longer needed. Please consult [place] again if new needs arise.   Final next level of care: Skilled Nursing Facility Barriers to Discharge: No Barriers Identified   Patient Goals and CMS Choice Patient states their goals for this hospitalization and ongoing recovery are:: I want to go home and CMS Medicare.gov Compare Post Acute Care list provided to:: Patient Represenative (must comment)(Dametri ( son))    Discharge Placement                       Discharge Plan and Services   Discharge Planning Services: CM Consult                                 Social Determinants of Health (SDOH) Interventions     Readmission Risk Interventions No flowsheet data found.

## 2019-12-27 ENCOUNTER — Other Ambulatory Visit: Payer: Self-pay | Admitting: Pharmacist

## 2019-12-27 MED ORDER — APIXABAN 5 MG PO TABS: 5.0000 mg | ORAL_TABLET | Freq: Two times a day (BID) | ORAL | 2 refills | Status: DC

## 2019-12-27 NOTE — Telephone Encounter (Signed)
Patient is overdue for appointment. I will give three months but then patient needs appointment for more refills Scr 1.11 on 12/22/19 84 years old [place] patient to let him know that he needed an appointment. Person who I spoke with on the phone states he was in rehab. No way to contact him

## 2020-01-10 ENCOUNTER — Telehealth: Payer: Self-pay | Admitting: Family Medicine

## 2020-01-10 NOTE — Telephone Encounter (Signed)
Amy Moon called and wants orders on PT 2 times a week for four weeks. Please call at [phone]

## 2020-01-11 NOTE — Telephone Encounter (Signed)
Amy moon called for verbal orders on PT 2 X a week for 4 weeks.  Gloriann Riede,cma

## 2020-01-11 NOTE — Telephone Encounter (Signed)
Verbal orders can be given. 

## 2020-01-11 NOTE — Telephone Encounter (Signed)
Verbal orders given to Amy from [facility] for PT 2 X a week for 4 weeks,  Herman Mell,cma

## 2020-01-13 DIAGNOSIS — R55 Syncope and collapse: Secondary | ICD-10-CM

## 2020-01-17 ENCOUNTER — Ambulatory Visit (INDEPENDENT_AMBULATORY_CARE_PROVIDER_SITE_OTHER): Payer: Medicare Other | Admitting: Family Medicine

## 2020-01-17 ENCOUNTER — Other Ambulatory Visit: Payer: Self-pay

## 2020-01-17 ENCOUNTER — Encounter: Payer: Self-pay | Admitting: Family Medicine

## 2020-01-17 ENCOUNTER — Telehealth: Payer: Self-pay | Admitting: Family Medicine

## 2020-01-17 DIAGNOSIS — C61 Malignant neoplasm of prostate: Secondary | ICD-10-CM

## 2020-01-17 DIAGNOSIS — N3001 Acute cystitis with hematuria: Secondary | ICD-10-CM | POA: Diagnosis not present

## 2020-01-17 DIAGNOSIS — E43 Unspecified severe protein-calorie malnutrition: Secondary | ICD-10-CM

## 2020-01-17 DIAGNOSIS — R634 Abnormal weight loss: Secondary | ICD-10-CM

## 2020-01-17 DIAGNOSIS — D509 Iron deficiency anemia, unspecified: Secondary | ICD-10-CM

## 2020-01-17 LAB — COMPREHENSIVE METABOLIC PANEL
ALT: 13 U/L (ref 0–53)
AST: 12 U/L (ref 0–37)
Albumin: 3.9 g/dL (ref 3.5–5.2)
Alkaline Phosphatase: 72 U/L (ref 39–117)
BUN: 25 mg/dL — ABNORMAL HIGH (ref 6–23)
CO2: 26 mEq/L (ref 19–32)
Calcium: 9 mg/dL (ref 8.4–10.5)
Chloride: 109 mEq/L (ref 96–112)
Creatinine, Ser: 1.15 mg/dL (ref 0.40–1.50)
GFR: 60.85 mL/min (ref >60.00)
Glucose, Bld: 132 mg/dL — ABNORMAL HIGH (ref 70–99)
Potassium: 3.9 mEq/L (ref 3.5–5.1)
Sodium: 143 mEq/L (ref 135–145)
Total Bilirubin: 0.5 mg/dL (ref 0.2–1.2)
Total Protein: 6.2 g/dL (ref 6.0–8.3)

## 2020-01-17 LAB — URINALYSIS, MICROSCOPIC ONLY

## 2020-01-17 LAB — POCT URINALYSIS DIPSTICK
Bilirubin, UA: NEGATIVE
Blood, UA: POSITIVE
Glucose, UA: NEGATIVE
Ketones, UA: NEGATIVE
Nitrite, UA: NEGATIVE
Protein, UA: POSITIVE — AB
Spec Grav, UA: 1.02 (ref 1.010–1.025)
Urobilinogen, UA: 0.2 E.U./dL
pH, UA: 6 (ref 5.0–8.0)

## 2020-01-17 MED ORDER — FERROUS SULFATE 325 (65 FE) MG PO TABS: 325.0000 mg | ORAL_TABLET | Freq: Every day | ORAL | 1 refills | Status: DC

## 2020-01-17 NOTE — Assessment & Plan Note (Signed)
Significant protein calorie malnutrition.  Gave dietary advice.  Discussed high-calorie foods and adding in Ensure twice daily.  Check CMP.

## 2020-01-17 NOTE — Telephone Encounter (Signed)
Carl [name] from [facility] is calling about an order for date 4/15 that was faxed to [place] on 4/20. She said she will have them refax the order.

## 2020-01-17 NOTE — Assessment & Plan Note (Signed)
Currently on Lupron through urology.  He will continue to see them for this.

## 2020-01-17 NOTE — Assessment & Plan Note (Addendum)
Prior UTI.  He has chronic urgency and frequency.  We will check a urinalysis, micro, and culture today.  He will continue to see his urologist.

## 2020-01-17 NOTE — Assessment & Plan Note (Signed)
Relatively stable over the last several months.  Suspect related to his prostate cancer.  Given dietary advice.

## 2020-01-17 NOTE — Patient Instructions (Signed)
Nice to see you. We will check lab work and her urine. Please add in high calorie foods as we discussed.  You can take Ensure twice daily as well.

## 2020-01-17 NOTE — Assessment & Plan Note (Signed)
Noted during hospitalization.  Discussed taking iron supplementation.  This was sent to his pharmacy.  Discussed the risk of constipation and stool color change with this.  He will let us know if those become issues.

## 2020-01-17 NOTE — Progress Notes (Signed)
[name], MD Phone: [phone]  Carl [name] is a 84 y.o. male who presents today for follow-up.  Prostate cancer: Currently undergoing treatment with urology with Lupron.  He is due for bone scan next month.  He sees urology at that time.  History of UTI: Patient had a pseudomonal UTI during recent hospitalization.  He is discharged on fosfomycin.  He notes no blood in his urine or dysuria though does have some chronic stable urgency and has had some increased frequency though that has been going on for the last year or so.  Iron deficiency anemia: Found on recent hospitalization lab work.  He has not been taking any iron supplements since being home from the nursing facility.  Protein calorie malnutrition: His daughter notes his appetite is poor.  He eats 1-2 meals a day.  At dinner he will eat a little bit and then later put the food in the microwave heated up and it will be there the next morning.  He was drinking Ensure to help with protein and calorie intake though he has stopped doing that.  Typically eats roasted vegetables and meats.  He denies any abdominal pain.  He does have home health and palliative care involved.  Social History   Tobacco Use  Smoking Status Former Smoker  . Years: 2.00  . Types: Cigarettes  . Quit date: 07/28/1960  . Years since quitting: 59.5  Smokeless Tobacco Never Used     ROS see history of present illness  Objective  Physical Exam Vitals:   01/17/20 1213  BP: 120/60  Pulse: (!) 105  Temp: (!) 97.3 F (36.3 C)  SpO2: 99%    BP Readings from Last 3 Encounters:  01/17/20 120/60  12/23/19 115/60  06/15/19 124/78   Wt Readings from Last 3 Encounters:  01/17/20 138 lb 9.6 oz (62.9 kg)  12/19/19 144 lb (65.3 kg)  10/12/19 140 lb (63.5 kg)    Physical Exam Constitutional:      General: He is not in acute distress.    Appearance: He is not diaphoretic.     Comments: Thin, frail-appearing male  Cardiovascular:      Rate and Rhythm: Normal rate and regular rhythm.     Heart sounds: Normal heart sounds.  Pulmonary:     Effort: Pulmonary effort is normal.     Breath sounds: Normal breath sounds.  Abdominal:     General: Bowel sounds are normal. There is no distension.     Palpations: Abdomen is soft.     Tenderness: There is no abdominal tenderness. There is no guarding or rebound.  Musculoskeletal:     Right lower leg: No edema.     Left lower leg: No edema.  Skin:    General: Skin is warm and dry.  Neurological:     Mental Status: He is alert.      Assessment/Plan: Please see individual problem list.  Acute cystitis with hematuria Prior UTI.  He has chronic urgency and frequency.  We will check a urinalysis, micro, and culture today.  He will continue to see his urologist.  Protein-calorie malnutrition, severe Significant protein calorie malnutrition.  Gave dietary advice.  Discussed high-calorie foods and adding in Ensure twice daily.  Check CMP.  Weight loss Relatively stable over the last several months.  Suspect related to his prostate cancer.  Given dietary advice.  Prostate cancer [facility]) Currently on Lupron through urology.  He will continue to see them for this.  Iron deficiency anemia  Noted during hospitalization.  Discussed taking iron supplementation.  This was sent to his pharmacy.  Discussed the risk of constipation and stool color change with this.  He will let us know if those become issues.   Patient will continue to follow with home health and palliative care.  Orders Placed This Encounter  Procedures  . Urine Culture  . Comp Met (CMET)  . Urine Microscopic  . POCT Urinalysis Dipstick    Meds ordered this encounter  Medications  . ferrous sulfate 325 (65 FE) MG tablet    Sig: Take 1 tablet (325 mg total) by mouth daily with breakfast.    Dispense:  30 tablet    Refill:  1    This visit occurred during the SARS-CoV-2 public health emergency.  Safety protocols  were in place, including screening questions prior to the visit, additional usage of staff PPE, and extensive cleaning of exam room while observing appropriate contact time as indicated for disinfecting solutions.    [name], MD [place]

## 2020-01-18 LAB — URINE CULTURE
MICRO NUMBER:: 10619846
SPECIMEN QUALITY:: ADEQUATE

## 2020-01-18 NOTE — Telephone Encounter (Signed)
Faxed to [facility] on 01/17/20. Confirmation given.  Deryck Hippler,cma

## 2020-01-25 ENCOUNTER — Telehealth: Payer: Self-pay | Admitting: Family Medicine

## 2020-01-25 NOTE — Telephone Encounter (Signed)
I called and gave lab results to patient's daughter.  Melaine Mcphee,cma

## 2020-01-25 NOTE — Telephone Encounter (Signed)
Pt's daughter [name] called to get lab results. Please call [phone]

## 2020-01-26 ENCOUNTER — Telehealth: Payer: Self-pay | Admitting: Family Medicine

## 2020-01-26 ENCOUNTER — Telehealth: Payer: Self-pay

## 2020-01-26 NOTE — Telephone Encounter (Signed)
I called and LVM for the patient's daughter to return a call to me.  Marilyn Nihiser,cma

## 2020-01-26 NOTE — Telephone Encounter (Signed)
"  [facility] called and the family would like to start palliative care again and wants to know if you are in agreement.Also the PT nurse called earlier and stated the patient is congested and stated he has a sore throat. All since last night no fever and he has not had a BM in 3-4 days. Please advise. Nina,cma"   Received the above message through secure chat. I am ok with them resuming palliative care. He should be tested for COVID with those symptoms and should quarantine until his result returns. They can try miralax for his constipation. He could complete a virtual visit for this if needed.

## 2020-01-26 NOTE — Telephone Encounter (Signed)
Sent the message to the provider.  Almendra Loria,cma

## 2020-01-26 NOTE — Telephone Encounter (Signed)
See other phone note

## 2020-01-26 NOTE — Telephone Encounter (Signed)
I called and spoke with the patient because I could not reach the daughter and I informed the patient to have a covid test and to quarantine until his results come back he understood and stated he was going to [facility] to have the test, I also informed him to purchase Miralax for the constipation and if he is not better and wants a visit to call back and we can do a virtual visit and he understood. Carl [name],cma

## 2020-01-26 NOTE — Telephone Encounter (Signed)
[facility] and stated that the family of pt are requesting palliative again for pt wanted to know if Dr.Sonnenberg was in agreement with this. You can contact [facility] at [phone] opt2

## 2020-01-27 ENCOUNTER — Telehealth: Payer: Self-pay | Admitting: Nurse Practitioner

## 2020-01-27 NOTE — Telephone Encounter (Signed)
Spoke with daughter [name], about schedule a Palliative Consult for patient and she was in agreement with this. I have scheduled an In-person Consult for 02/16/20 @ 10 AM.

## 2020-01-31 ENCOUNTER — Telehealth: Payer: Self-pay | Admitting: Family Medicine

## 2020-01-31 NOTE — Telephone Encounter (Signed)
I called and spoke with Amy moon from [facility] and I gave her a verbal order for additional physical therapy for the next 2 weeks for the patient. She stated that the patient was feeling a little better from last week, he is still complaining of a sore throat but his covid test was negative, she wanted you to know.  Dent Plantz,cma

## 2020-01-31 NOTE — Telephone Encounter (Signed)
Contact person at [facility] is [employer] [phone]

## 2020-01-31 NOTE — Telephone Encounter (Signed)
Noted. Ok to give verbal orders 

## 2020-01-31 NOTE — Telephone Encounter (Signed)
[facility] called request additional physical therapy  1 time a week for next 2 weeks starting on 02-05-20 also wanted Dr.Sonnenberg know that Patient was not feel well

## 2020-02-03 ENCOUNTER — Ambulatory Visit (INDEPENDENT_AMBULATORY_CARE_PROVIDER_SITE_OTHER): Payer: Medicare Other | Admitting: Family

## 2020-02-03 ENCOUNTER — Other Ambulatory Visit: Payer: Self-pay

## 2020-02-03 ENCOUNTER — Telehealth: Payer: Self-pay | Admitting: Family Medicine

## 2020-02-03 ENCOUNTER — Encounter: Payer: Self-pay | Admitting: Family

## 2020-02-03 VITALS — BP 98/54 | HR 92 | Ht 68.0 in | Wt 137.4 lb

## 2020-02-03 DIAGNOSIS — I2510 Atherosclerotic heart disease of native coronary artery without angina pectoris: Secondary | ICD-10-CM

## 2020-02-03 DIAGNOSIS — Z79899 Other long term (current) drug therapy: Secondary | ICD-10-CM

## 2020-02-03 DIAGNOSIS — Z7901 Long term (current) use of anticoagulants: Secondary | ICD-10-CM

## 2020-02-03 DIAGNOSIS — I480 Paroxysmal atrial fibrillation: Secondary | ICD-10-CM | POA: Diagnosis not present

## 2020-02-03 MED ORDER — AMIODARONE HCL 200 MG PO TABS: 200.0000 mg | ORAL_TABLET | Freq: Every day | ORAL | 11 refills | Status: DC

## 2020-02-03 NOTE — Progress Notes (Signed)
Office Visit    Patient Name: Carl [name] Date of Encounter: 02/03/2020  Primary Care Provider:  [name], MD Primary Cardiologist:  Ida Rogue, MD Electrophysiologist:  None   Chief Complaint    Carl [name] is a 84 y.o. male with a hx of CAD s/p CABG X5 1006, ICM with recovery of LVEF, paroxysmal atrial fibrillation on Eliquis, atrial flutter, moderate MR, abdominal aortic aneurysm, CKD 3, nephrolithiasis, anemia of chronic disease, pulmonary nodule, HTN, HLD, orthostatic hypotension, GERD presents today for follow-up of GERD  Past Medical History    Past Medical History:  Diagnosis Date   (HFpEF) heart failure with preserved ejection fraction ([name])    a. 08/2016 Echo: EF 50-55%.   AAA (abdominal aortic aneurysm) ([name]) 06/05/2019   abdominal aortic aneurysm measuring 3.1 cm.   Arthritis    arms   Bladder calculus    Chronic anemia    Chronic anxiety    CKD (chronic kidney disease), stage III    Coronary artery disease CARDIOLOGIST-  DR GOLLAN   a. CABG 2006 with  LIMA-LAD, SVG-Ramus, SVG-OM, seq SVG-PLV-PDA.   DOE (dyspnea on exertion)    Frequent falls 11/2019   GERD (gastroesophageal reflux disease)    Heart murmur    Hematuria    Hip fracture ([name]) 10/05/2018   History of bladder stone    History of colon polyps    History of non-ST elevation myocardial infarction (NSTEMI) 03/30/2005   post op recovery inguinal hernia repair   History of pressure ulcer    History of recurrent UTIs    last admission 08-04-2018 pseudomonas UTI   Hyperlipidemia    Hyperplasia of prostate without lower urinary tract symptoms (LUTS)    Hypertension    Indwelling Foley catheter present    Intracranial hematoma ([name]) 10/05/2018   Ischemic cardiomyopathy    a. echo (01/13) 45-50% septal akinesis, mild MR; b. Echo  (01/16) ef 60-65%, mid-apical anteroseptal hypokinesis; c. echo 09-16-2016 ef 50-55%   Left ureteral stone    Lung  nodule    noted 08/ 2018;  followed by pcp   LVH (left ventricular hypertrophy) 09/16/2016   Moderate noted on ECHO   Moderate mitral regurgitation    a.08/2016 Echo: Mod MR/TR.   Orthostatic hypotension    PAC (premature atrial contraction)    Paroxysmal atrial flutter ([name])    a. dx 08/2016, started on amiodarone.   Persistent atrial fibrillation [name]) followed by cardiologist-- dr Rockey Situ   a. dx AFib 08/2016 s/p DCCV 10/2016-->eliquis (CHA2DS2VASc = 5).   Pneumonia    Pulmonary hypertension ([place])    last echo in epic 09-16-2016   Recurrent prostate cancer ([name]) UROLOGIST-- DR BELL   s/p  radioactive prostate seed implants in 1997;    due to PSA rising pt has been doing lupron injection's few past several yrs at dr bell office   Renal calculi    bilateral per CT 06-18-2018   S/P CABG x 5 04/04/2005   LIMA to LAD,  SVG to Ramus,  SVG to OM,  seqSVG to PLV and PDA   Wears glasses    Past Surgical History:  Procedure Laterality Date   CARDIAC CATHETERIZATION  04/02/2005   dr Doreatha Lew    Critical three-vessel coronary disease --  Essentially normal left ventricular function , ef 55%   CARDIOVASCULAR STRESS TEST  05-01-2008  dr Doreatha Lew /  dr harding   Low risk nuclear study w/ no ischemia or scar but  flattened septal motion/  ef 64%   CARDIOVERSION N/A 11/19/2016   Procedure: CARDIOVERSION;  Surgeon: Jerline Pain, MD;  Location: [name];  Service: Cardiovascular;  Laterality: N/A;   COLONOSCOPY N/A 10/23/2016   Procedure: COLONOSCOPY;  Surgeon: Milus Banister, MD;  Location: [name];  Service: [name];  Laterality: N/A;   CORONARY ARTERY BYPASS GRAFT  04/04/2005   dr  Ricard Dillon   LIMA - LAD,  SVG - Ramus, SVG - OM,  seqSVG - PLV and PDA   CYSTOSCOPY WITH LITHOLAPAXY N/A 05/07/2016   Procedure: CYSTOSCOPY WITH LITHOLAPAXY;  Surgeon: Rana Snare, MD;  Location: [name];  Service: Urology;  Laterality: N/A;   CYSTOSCOPY WITH RETROGRADE  PYELOGRAM, URETEROSCOPY AND STENT PLACEMENT  09/03/2018   Procedure: CYSTOSCOPY WITH RETROGRADE PYELOGRAM, URETEROSCOPY LASER LITHOTRIPSY AND STENT PLACEMENT;  Surgeon: Lucas Mallow, MD;  Location: [facility];  Service: Urology;;   CYSTOSCOPY/URETEROSCOPY/HOLMIUM LASER/STENT PLACEMENT Right 06/15/2019   Procedure: CYSTOSCOPY RIGHT URETEROSCOPY HOLMIUM LASER STENT PLACEMENT;  Surgeon: Lucas Mallow, MD;  Location: [facility];  Service: Urology;  Laterality: Right;   HOLMIUM LASER APPLICATION N/A 63/[id number]/5885   Procedure: HOLMIUM LASER APPLICATION;  Surgeon: Rana Snare, MD;  [name];  Service: Urology;  Laterality: N/A;   INGUINAL HERNIA REPAIR Left 03-30-2005  dr Excell Seltzer   incarcerated    [name] Right 10/07/2018   Procedure: TOTAL HIP ARTHROPLASTY ANTERIOR APPROACH;  Surgeon: Rod Can, MD;  Location: [facility];  Service: Orthopedics;  Laterality: Right;   TRANSTHORACIC ECHOCARDIOGRAM  08/24/2014   mid anteroseptal and distal septak hypokinetic,  mild focal basal LVH, ef 60-65%,  grade 1 diastolic dysfunction/  mild MR/  mild LAE/  mild dilated RV with normal RVSP/  trivial TR    Allergies  Allergies  Allergen Reactions   Ciprofloxacin Other (See Comments)    PER HEART DR   Ibuprofen Other (See Comments)    Kidney issues    History of Present Illness    Carl [name] is a 84 y.o. male with a hx of CAD s/p CABG X5 1006, ICM with recovery of LVEF, paroxysmal atrial fibrillation on Eliquis, atrial flutter, moderate MR, abdominal aortic aneurysm, metastatic prostate cancer on hormone therapy, CKD 3, nephrolithiasis, anemia of chronic disease, pulmonary nodule, HTN, HLD, orthostatic hypotension, GERD.  He was last seen 11/2018 via telemedicine by Ignacia Bayley, NP.   CAD [name] CABG X-2006 including LIMA to LAD, SVG to ramus intermedius, SVG to obtuse marginal, sequential SVG to RPL and RPDA.   Ischemic cardiomyopathy EF previously 45 to 50% range improved to 50-55% in February 2018.  History of PAF and anticoagulant on Eliquis with a brief episode of atrial flutter in August 2018.  Medical management of his multiple comorbidities has been limited by orthostatic hypotension resulting in discontinuation of beta-blocker.    Hospitalized March 2020 due to weakness and fall with finding of a mildly impacted acute fracture of the mid right femoral neck.  CT of head showed epidural hematoma versus loculated subdural hematoma without mass-effect or acute bleeding.  Underwent left hip surgery 10/07/2019.  Seen by neurosurgery with recommendation hold Eliquis until cleared by neurosurgery.  Disc charged to SNF.  Follow-up CT with unchanged, small, partially calcified lentiform extra-axial lesion about the left frontal lobe felt to be small meningioma versus small chronic hemorrhage.  He was placed back on Eliquis while at the SNF but  on discharge was not taking it.  His Eliquis was removed resumed after discussion between cardiology and Dr. Trenton Gammon of neurosurgery 12/16/2018.  ED visit 02/25/2019 with fall while on antibiotics for UTI.  No acute findings, given IV fluids and discharged.  He was admitted 02/2019 due to acute lower UTI with AMS.  Readmitted later that month with recurrent AMS in the setting of UTI.  ED visit 06/05/2019 with urinary retention.  Noted nephrolithiasis.  Underwent cystoscopy 06/15/2019.  Admitted 12/19/2019-12/23/2019 for Pseudomonas UTI.    Per documentation from PCP office he and his family have requested to resume palliative care. They have an upcoming appointment 02/16/20.   Presents today with his daughter who is his caretaker.  Reports no chest pain, pressure, tightness.  Reports no shortness of breath at rest nor with exertion.  Denies palpitations.  Does endorse lightheadedness which he attributes with position changes and low blood pressure.  His daughter has been working with him  to try and get him to eat and drink more consistently in order to prevent low blood pressure and to help him gain weight.  EKGs/Labs/Other Studies Reviewed:   The following studies were reviewed today:  EKG:  EKG is ordered today.  The ekg ordered today demonstrates SR 92 bpm with 1st degree AV block and low voltage QRS  Recent Labs: 09/08/2019: TSH 1.32 12/22/2019: Hemoglobin 9.1; Magnesium 2.0; Platelets 136 01/17/2020: ALT 13; BUN 25; Creatinine, Ser 1.15; Potassium 3.9; Sodium 143  Recent Lipid Panel    Component Value Date/Time   CHOL 130 07/12/2018 1148   TRIG 65 07/12/2018 1148   HDL 62 07/12/2018 1148   CHOLHDL 2.1 07/12/2018 1148   CHOLHDL 3.1 03/13/2017 0451   VLDL 16 03/13/2017 0451   LDLCALC 55 07/12/2018 1148    Home Medications   No outpatient medications have been marked as taking for the 02/03/20 encounter (Appointment) with Loel Dubonnet, NP.      Review of Systems   Review of Systems  Constitutional: Positive for decreased appetite and weight loss. Negative for chills, fever and malaise/fatigue.  Cardiovascular: Negative for chest pain, dyspnea on exertion, irregular heartbeat, leg swelling, near-syncope, orthopnea, palpitations and syncope.  Respiratory: Negative for cough, shortness of breath and wheezing.   Gastrointestinal: Negative for melena, nausea and vomiting.  Genitourinary: Negative for hematuria.  Neurological: Negative for dizziness, light-headedness and weakness.   All other systems reviewed and are otherwise negative except as noted above.  Physical Exam    VS:  There were no vitals taken for this visit. , BMI There is no height or weight on file to calculate BMI. GEN: Frail, well developed, in no acute distress. HEENT: normal. Neck: Supple, no JVD, carotid bruits, or masses. Cardiac: RRR, no murmurs, rubs, or gallops. No clubbing, cyanosis, edema.  Radials/DP/PT 2+ and equal bilaterally.  Respiratory:  Respirations regular and  unlabored, clear to auscultation bilaterally. GI: Soft, nontender, nondistended, BS + x 4. MS: No deformity or atrophy. Skin: Warm and dry, no rash. Neuro:  Strength and sensation are intact. Psych: Normal affect.  Assessment & Plan    1. CAD s/p CABG -stable with no anginal symptoms.  No indication for ischemic evaluation at this time.  No aspirin secondary to chronic anticoagulation.  Continue Crestor 40 mg daily.  No beta-blocker due to hypotension and previous bradycardia.  2. ICM with now normalized LVEF - Euvolemic and well compensated on exam.  No indication for loop diuretic this time.  BP precludes escalation of  GDMT.  3. Persistent atrial fibrillation/flutter on amiodarone therapy and chronic anticoagulation -maintaining NSR.  Denies palpitations.  No bleeding complications on Eliquis 5 mg twice daily.  Of note he is nearing weight for dose reduction criteria.  He and daughter were instructed to report if weight is consistently less than 132 pounds for 2 weeks at which time we will reduce his Eliquis to 2.5 mg twice daily.  Refill of amiodarone provided today.  Recent liver and thyroid function were normal.  No signs of toxicity.    4. CKD 3 - Avoid nephrotoxic agents.   5. HTN/orthostatic hypotension - Hx of HTN now with hypotension likely due to protein calorie malnutrition and subsequent frailty. Encouraged PO fluids and hydration. No indication for antihypertensive therapy at this time. Orthostatic precautions discussed.   6. HLD, LDL goal less than 70 -continue Crestor 40 mg daily.  7. Abdominal aortic aneurysm -noted on CT November 2019 with recommendation for a follow-up in 3 years.  8. Moderate MR -no shortness of breath at rest and with exertion.  Does endorse lightheadedness which is attributed to low blood pressure.  Will defer echocardiogram until after his upcoming discussion with palliative care.  Discussed with him and daughter that he would not want additional  intervention.  Disposition: Follow up in 6 month(s) with Dr. Rockey Situ or APP.    Loel Dubonnet, NP 02/03/2020, 2:43 PM

## 2020-02-03 NOTE — Patient Instructions (Addendum)
Medication Instructions:  No medication changes today.   *If you need a refill on your cardiac medications before your next appointment, please call your pharmacy*  Lab Work: No lab work today.   Testing/Procedures: Your EKG today shows normal sinus rhythm.    Follow-Up: At [facility], you and your health needs are our priority.  As part of our continuing mission to provide you with exceptional heart care, we have created designated Provider Care Teams.  These Care Teams include your primary Cardiologist (physician) and Advanced Practice Providers (APPs -  Physician Assistants and Nurse Practitioners) who all work together to provide you with the care you need, when you need it.  We recommend signing up for the patient portal called "MyChart".  Sign up information is provided on this After Visit Summary.  MyChart is used to connect with patients for Virtual Visits (Telemedicine).  Patients are able to view lab/test results, encounter notes, upcoming appointments, etc.  Non-urgent messages can be sent to your provider as well.   To learn more about what you can do with MyChart, go to [email or web address].    Your next appointment:   6 month(s)  The format for your next appointment:   In Person  Provider:   You may see Ida Rogue, MD or one of the following Advanced Practice Providers on your designated Care Team:    Murray Hodgkins, NP  [name], PA-C  Marrianne Mood, PA-C  Laurann Montana, NP   Other Instructions  If your weight falls below 132 pounds consistently for 2 weeks, please call our office. If your weight is consistently less than <60kg we will reduce your Eliquis dose for safety.

## 2020-02-07 ENCOUNTER — Other Ambulatory Visit: Payer: Self-pay

## 2020-02-07 ENCOUNTER — Emergency Department
Admission: EM | Admit: 2020-02-07 | Discharge: 2020-02-07 | Disposition: A | Discharge status: Home / Self Care | Payer: Medicare Other | Attending: Emergency Medicine | Admitting: Emergency Medicine

## 2020-02-07 DIAGNOSIS — I2510 Atherosclerotic heart disease of native coronary artery without angina pectoris: Secondary | ICD-10-CM | POA: Diagnosis not present

## 2020-02-07 DIAGNOSIS — Z7901 Long term (current) use of anticoagulants: Secondary | ICD-10-CM | POA: Insufficient documentation

## 2020-02-07 DIAGNOSIS — R531 Weakness: Secondary | ICD-10-CM | POA: Diagnosis present

## 2020-02-07 DIAGNOSIS — E860 Dehydration: Secondary | ICD-10-CM | POA: Insufficient documentation

## 2020-02-07 DIAGNOSIS — Z8546 Personal history of malignant neoplasm of prostate: Secondary | ICD-10-CM | POA: Insufficient documentation

## 2020-02-07 DIAGNOSIS — Z79899 Other long term (current) drug therapy: Secondary | ICD-10-CM | POA: Insufficient documentation

## 2020-02-07 DIAGNOSIS — Z951 Presence of aortocoronary bypass graft: Secondary | ICD-10-CM | POA: Insufficient documentation

## 2020-02-07 DIAGNOSIS — N3000 Acute cystitis without hematuria: Secondary | ICD-10-CM | POA: Diagnosis not present

## 2020-02-07 DIAGNOSIS — Z87891 Personal history of nicotine dependence: Secondary | ICD-10-CM | POA: Diagnosis not present

## 2020-02-07 DIAGNOSIS — I5030 Unspecified diastolic (congestive) heart failure: Secondary | ICD-10-CM | POA: Diagnosis not present

## 2020-02-07 DIAGNOSIS — I130 Hypertensive heart and chronic kidney disease with heart failure and stage 1 through stage 4 chronic kidney disease, or unspecified chronic kidney disease: Secondary | ICD-10-CM | POA: Insufficient documentation

## 2020-02-07 DIAGNOSIS — N183 Chronic kidney disease, stage 3 unspecified: Secondary | ICD-10-CM | POA: Insufficient documentation

## 2020-02-07 LAB — COMPREHENSIVE METABOLIC PANEL
ALT: 18 U/L (ref 0–44)
AST: 16 U/L (ref 15–41)
Albumin: 2.9 g/dL — ABNORMAL LOW (ref 3.5–5.0)
Alkaline Phosphatase: 56 U/L (ref 38–126)
Anion gap: 5 (ref 5–15)
BUN: 24 mg/dL — ABNORMAL HIGH (ref 8–23)
CO2: 23 mmol/L (ref 22–32)
Calcium: 8 mg/dL — ABNORMAL LOW (ref 8.9–10.3)
Chloride: 113 mmol/L — ABNORMAL HIGH (ref 98–111)
Creatinine, Ser: 1.24 mg/dL (ref 0.61–1.24)
GFR calc Af Amer: >60 mL/min (ref >60)
GFR calc non Af Amer: 54 mL/min — ABNORMAL LOW (ref >60)
Glucose, Bld: 100 mg/dL — ABNORMAL HIGH (ref 70–99)
Potassium: 3.9 mmol/L (ref 3.5–5.1)
Sodium: 141 mmol/L (ref 135–145)
Total Bilirubin: 0.4 mg/dL (ref 0.3–1.2)
Total Protein: 5.4 g/dL — ABNORMAL LOW (ref 6.5–8.1)

## 2020-02-07 LAB — URINALYSIS, COMPLETE (UACMP) WITH MICROSCOPIC
Bilirubin Urine: NEGATIVE
Glucose, UA: NEGATIVE mg/dL
Ketones, ur: NEGATIVE mg/dL
Nitrite: NEGATIVE
Protein, ur: 100 mg/dL — AB
RBC / HPF: >50 RBC/hpf — ABNORMAL HIGH (ref 0–5)
Specific Gravity, Urine: 1.017 (ref 1.005–1.030)
Squamous Epithelial / HPF: NONE SEEN (ref 0–5)
WBC, UA: >50 WBC/hpf — ABNORMAL HIGH (ref 0–5)
pH: 5 (ref 5.0–8.0)

## 2020-02-07 LAB — CBC WITH DIFFERENTIAL/PLATELET
Abs Immature Granulocytes: 0.02 10*3/uL (ref 0.00–0.07)
Basophils Absolute: 0 10*3/uL (ref 0.0–0.1)
Basophils Relative: 0 %
Eosinophils Absolute: 0 10*3/uL (ref 0.0–0.5)
Eosinophils Relative: 0 %
HCT: 32.8 % — ABNORMAL LOW (ref 39.0–52.0)
Hemoglobin: 10.7 g/dL — ABNORMAL LOW (ref 13.0–17.0)
Immature Granulocytes: 0 %
Lymphocytes Relative: 14 %
Lymphs Abs: 1 10*3/uL (ref 0.7–4.0)
MCH: 32.4 pg (ref 26.0–34.0)
MCHC: 32.6 g/dL (ref 30.0–36.0)
MCV: 99.4 fL (ref 80.0–100.0)
Monocytes Absolute: 0.5 10*3/uL (ref 0.1–1.0)
Monocytes Relative: 7 %
Neutro Abs: 5.3 10*3/uL (ref 1.7–7.7)
Neutrophils Relative %: 79 %
Platelets: 194 10*3/uL (ref 150–400)
RBC: 3.3 MIL/uL — ABNORMAL LOW (ref 4.22–5.81)
RDW: 16 % — ABNORMAL HIGH (ref 11.5–15.5)
WBC: 6.8 10*3/uL (ref 4.0–10.5)
nRBC: 0 % (ref 0.0–0.2)

## 2020-02-07 LAB — TROPONIN I (HIGH SENSITIVITY)
Troponin I (High Sensitivity): 6 ng/L (ref <18)
Troponin I (High Sensitivity): 6 ng/L (ref <18)

## 2020-02-07 LAB — BRAIN NATRIURETIC PEPTIDE: B Natriuretic Peptide: 112.8 pg/mL — ABNORMAL HIGH (ref 0.0–100.0)

## 2020-02-07 MED ORDER — SODIUM CHLORIDE 0.9 % IV BOLUS
250.0000 mL | Freq: Once | INTRAVENOUS | Status: AC
  Administered 2020-02-07: 250 mL via INTRAVENOUS

## 2020-02-07 MED ORDER — CEFDINIR 300 MG PO CAPS
300.0000 mg | ORAL_CAPSULE | Freq: Two times a day (BID) | ORAL | 0 refills | Status: DC
Start: 2020-02-07 — End: 2020-02-14

## 2020-02-07 MED ORDER — SODIUM CHLORIDE 0.9 % IV BOLUS
500.0000 mL | Freq: Once | INTRAVENOUS | Status: AC
  Administered 2020-02-07: 500 mL via INTRAVENOUS

## 2020-02-07 MED ORDER — SODIUM CHLORIDE 0.9 % IV SOLN
2.0000 g | Freq: Once | INTRAVENOUS | Status: AC
  Administered 2020-02-07: 2 g via INTRAVENOUS
  Filled 2020-02-07: qty 20

## 2020-02-07 NOTE — ED Notes (Signed)
Caryl Pina RN and this RN repositioned pt. Pt assisted to use urinal again as requested.

## 2020-02-07 NOTE — ED Notes (Signed)
Repeat EKG to EDP Isaacs.

## 2020-02-07 NOTE — ED Notes (Signed)
Pt currently using urinal. Visitor at bedside. Will collect orthostatic vitals once pt finished.

## 2020-02-07 NOTE — Discharge Instructions (Addendum)
It is VERY important to drink at least 8 glasses of water daily for the next several days  Take the antibiotic as prescribed

## 2020-02-07 NOTE — ED Notes (Signed)
[phone]. Ride home.

## 2020-02-07 NOTE — ED Notes (Signed)
Pt talked with his daughter on his personal phone. Daughter notified by this RN one visitor welcome to bedside.

## 2020-02-07 NOTE — ED Notes (Signed)
Fluids nearly finished; have 75cc left to go.

## 2020-02-07 NOTE — ED Notes (Signed)
Pt given food tray and gingerale with EDP Isaacs verbal okay.

## 2020-02-07 NOTE — ED Triage Notes (Signed)
Pt in from home d/t low BP with PT. Pt working with pt d/t fall a month ago. Pt's BP 88/50 with PT while sitting. 122/70 laying and 58/40 while standing with EMS. BG 152, 76HR, 96% RA, A&Ox4 with EMS. 18g R fa IV placed by EMS and given 200cc NS; history of heart failure per EMS.

## 2020-02-07 NOTE — ED Notes (Signed)
Blue/grn/lav tubes sent to lab. Pt A&Ox4. Orthostatic vitals with home PT and EMS. Resp reg/unlabored. EKG completed upon pt's arrival.

## 2020-02-07 NOTE — ED Notes (Signed)
500cc bolus finished.

## 2020-02-07 NOTE — ED Notes (Signed)
Called Carl [name] in the lab. States adding on UA to previously sent sample now.

## 2020-02-07 NOTE — ED Notes (Signed)
Pt given blanket and urinal.

## 2020-02-07 NOTE — ED Notes (Signed)
Urine sample sent to lab. Bed pad placed under pt. Pt repositioned.

## 2020-02-07 NOTE — ED Notes (Signed)
Urinal emptied again and given back to pt as he stated needed to use it again. Repositioned again & assisted to pull down his pants again.

## 2020-02-07 NOTE — ED Provider Notes (Signed)
[facility] Emergency Department Provider Note  ____________________________________________   First MD Initiated Contact with Patient 02/07/20 1349     (approximate)  I have reviewed the triage vital signs and the nursing notes.   HISTORY  Chief Complaint hypotension    HPI Carl [name] is a 84 y.o. male  Here with weakness/lightheadedness. Pt states that he was fine prior to PT today. He admits he has not been eating/drinkign as much as  He should but states this is just normal for him. He had PT come eval today and he reportedly was dizzy upon standing. EMS called and noted that he was markedly orthostatic to 16X systolic on standing. He states he has mild dizziness on standing but feels fine sitting down. No CP, SOB. No palpitations. No abd pain, n/v/d. No recent medication changes. Denies any focal numbness or weakness. He did not actually lose consciousness.        Past Medical History:  Diagnosis Date  . (HFpEF) heart failure with preserved ejection fraction ([place])    a. 08/2016 Echo: EF 50-55%.  [name] AAA (abdominal aortic aneurysm) ([place]) 06/05/2019   abdominal aortic aneurysm measuring 3.1 cm.  . Arthritis    arms  . Bladder calculus   . Chronic anemia   . Chronic anxiety   . CKD (chronic kidney disease), stage III   . Coronary artery disease CARDIOLOGIST-  DR Rockey Situ   a. CABG 2006 with  LIMA-LAD, SVG-Ramus, SVG-OM, seq SVG-PLV-PDA.  . DOE (dyspnea on exertion)   . Frequent falls 11/2019  . GERD (gastroesophageal reflux disease)   . Heart murmur   . Hematuria   . Hip fracture ([place]) 10/05/2018  . History of bladder stone   . History of colon polyps   . History of non-ST elevation myocardial infarction (NSTEMI) 03/30/2005   post op recovery inguinal hernia repair  . History of pressure ulcer   . History of recurrent UTIs    last admission 08-04-2018 pseudomonas UTI  . Hyperlipidemia   . Hyperplasia of prostate without lower urinary  tract symptoms (LUTS)   . Hypertension   . Indwelling Foley catheter present   . Intracranial hematoma ([place]) 10/05/2018  . Ischemic cardiomyopathy    a. echo (01/13) 45-50% septal akinesis, mild MR; b. Echo  (01/16) ef 60-65%, mid-apical anteroseptal hypokinesis; c. echo 09-16-2016 ef 50-55%  . Left ureteral stone   . Lung nodule    noted 08/ 2018;  followed by pcp  . LVH (left ventricular hypertrophy) 09/16/2016   Moderate noted on ECHO  . Moderate mitral regurgitation    a.08/2016 Echo: Mod MR/TR.  [name] Orthostatic hypotension   . PAC (premature atrial contraction)   . Paroxysmal atrial flutter ([place])    a. dx 08/2016, started on amiodarone.  . Persistent atrial fibrillation [facility]) followed by cardiologist-- dr Rockey Situ   a. dx AFib 08/2016 s/p DCCV 10/2016-->eliquis (CHA2DS2VASc = 5).  . Pneumonia   . Pulmonary hypertension ([place])    last echo in epic 09-16-2016  . Recurrent prostate cancer ([place]) UROLOGIST-- DR BELL   s/p  radioactive prostate seed implants in 1997;    due to PSA rising pt has been doing lupron injection's few past several yrs at dr bell office  . Renal calculi    bilateral per CT 06-18-2018  . S/P CABG x 5 04/04/2005   LIMA to LAD,  SVG to Ramus,  SVG to OM,  seqSVG to PLV and PDA  . Wears glasses  Patient Active Problem List   Diagnosis Date Noted  . Iron deficiency anemia 01/17/2020  . Near syncope   . Protein-calorie malnutrition, severe 12/22/2019  . Pyelonephritis 12/20/2019  . Fall 12/19/2019  . Weight loss 08/30/2019  . Onychomycosis 08/30/2019  . AAA (abdominal aortic aneurysm) ([place]) 08/26/2018  . Acute cystitis with hematuria   . Kidney stones 06/30/2018  . Allergic rhinitis 04/26/2018  . Urolithiasis 01/05/2018  . Frequent urination 01/01/2018  . Colon polyps 09/15/2017  . GERD (gastroesophageal reflux disease) 09/15/2017  . Wrist pain, acute, right 05/25/2017  . Low back pain 03/24/2017  . Lung nodule seen on imaging study: 7 mm nodule left  midlung 03/13/2017  . Physical deconditioning 03/13/2017  . Normocytic anemia 03/13/2017  . Fall at home, initial encounter 03/12/2017  . Abdominal soreness 03/12/2017  . Generalized weakness 03/12/2017  . Leukocytosis 03/12/2017  . Hyponatremia 03/12/2017  . Orthostatic hypotension 02/16/2017  . Anticoagulated 09/19/2016  . Prostate cancer ([place]) 09/04/2016  . Persistent atrial fibrillation 09/04/2016  . Cardiomyopathy, ischemic 07/16/2012  . Chronic kidney disease (CKD), stage III (moderate) ([place]) 07/16/2012  . Essential hypertension 08/05/2011  . Murmur 08/05/2011  . 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 Dyslipidemia 02/04/2011    Past Surgical History:  Procedure Laterality Date  . CARDIAC CATHETERIZATION  04/02/2005   dr Doreatha Lew    Critical three-vessel coronary disease --  Essentially normal left ventricular function , ef 55%  . CARDIOVASCULAR STRESS TEST  05-01-2008  dr Doreatha Lew /  dr harding   Low risk nuclear study w/ no ischemia or scar but flattened septal motion/  ef 64%  . CARDIOVERSION N/A 11/19/2016   Procedure: CARDIOVERSION;  Surgeon: Jerline Pain, MD;  Location: [facility];  Service: Cardiovascular;  Laterality: N/A;  . COLONOSCOPY N/A 10/23/2016   Procedure: COLONOSCOPY;  Surgeon: Milus Banister, MD;  Location: [facility];  Service: Endoscopy;  Laterality: N/A;  . CORONARY ARTERY BYPASS GRAFT  04/04/2005   dr  Ricard Dillon   LIMA - LAD,  SVG - Ramus, SVG - OM,  seqSVG - PLV and PDA  . CYSTOSCOPY WITH LITHOLAPAXY N/A 05/07/2016   Procedure: CYSTOSCOPY WITH LITHOLAPAXY;  Surgeon: Rana Snare, MD;  Location: [facility];  Service: Urology;  Laterality: N/A;  . CYSTOSCOPY WITH RETROGRADE PYELOGRAM, URETEROSCOPY AND STENT PLACEMENT  09/03/2018   Procedure: CYSTOSCOPY WITH RETROGRADE PYELOGRAM, URETEROSCOPY LASER LITHOTRIPSY AND STENT PLACEMENT;  Surgeon: Lucas Mallow, MD;  Location: [facility];  Service: Urology;;  . CYSTOSCOPY/URETEROSCOPY/HOLMIUM LASER/STENT  PLACEMENT Right 06/15/2019   Procedure: CYSTOSCOPY RIGHT URETEROSCOPY HOLMIUM LASER STENT PLACEMENT;  Surgeon: Lucas Mallow, MD;  Location: [facility];  Service: Urology;  Laterality: Right;  . HOLMIUM LASER APPLICATION N/A 26/83/4196   Procedure: HOLMIUM LASER APPLICATION;  Surgeon: Rana Snare, MD;  Location[facility];  Service: Urology;  Laterality: N/A;  . INGUINAL HERNIA REPAIR Left 03-30-2005  dr Excell Seltzer   incarcerated   . RADIOACTIVE PROSTATE SEED IMPLANTS  1997  . TOTAL HIP ARTHROPLASTY Right 10/07/2018   Procedure: TOTAL HIP ARTHROPLASTY ANTERIOR APPROACH;  Surgeon: Rod Can, MD;  Location: [facility];  Service: Orthopedics;  Laterality: Right;  . TRANSTHORACIC ECHOCARDIOGRAM  08/24/2014   mid anteroseptal and distal septak hypokinetic,  mild focal basal LVH, ef 60-65%,  grade 1 diastolic dysfunction/  mild MR/  mild LAE/  mild dilated RV with normal RVSP/  trivial TR    Prior to Admission medications   Medication  Sig Start Date End Date Taking? Authorizing Provider  acetaminophen (TYLENOL) 500 MG tablet Take 1,000 mg by mouth every 6 (six) hours as needed for mild pain (for pain).     [provider]  amiodarone (PACERONE) 200 MG tablet Take 1 tablet (200 mg total) by mouth daily. 02/03/20   Loel Dubonnet, NP  apixaban (ELIQUIS) 5 MG TABS tablet Take 1 tablet (5 mg total) by mouth 2 (two) times daily. 12/27/19   [name], NP  cefdinir (OMNICEF) 300 MG capsule Take 1 capsule (300 mg total) by mouth 2 (two) times daily for 7 days. 02/07/20 02/14/20  Duffy Bruce, MD  ferrous sulfate 325 (65 FE) MG tablet Take 1 tablet (325 mg total) by mouth daily with breakfast. 01/17/20   [name], MD  Hydrocortisone (GERHARDT'S BUTT CREAM) CREA Apply 1 application topically 4 (four) times daily. 12/23/19   [place] Donnamarie Poag, DO  lactose free nutrition (BOOST) LIQD Take 237 mLs by mouth. Drinks on [employer], Historical, MD   loratadine (CLARITIN) 10 MG tablet Take 10 mg by mouth daily as needed for allergies.    [provider]  omeprazole (PRILOSEC) 40 MG capsule TAKE 1 CAPSULE BY MOUTH EVERY DAY 08/04/19   [name], MD  polyethylene glycol (MIRALAX / GLYCOLAX) 17 g packet Take 17 g by mouth daily. 12/24/19   [place] Eric J, DO  rosuvastatin (CRESTOR) 40 MG tablet Take 1 tablet (40 mg total) by mouth daily. 08/19/19   [name], MD  tamsulosin (FLOMAX) 0.4 MG CAPS capsule Take 1 capsule (0.4 mg total) by mouth daily after supper. 01/06/18   [name], MD    Allergies Ciprofloxacin and Ibuprofen  Family History  Problem Relation Age of Onset  . Heart disease Mother   . Cancer Mother   . Cancer Father   . Cancer Brother     Social History Social History   Tobacco Use  . Smoking status: Former Smoker    Years: 2.00    Types: Cigarettes    Quit date: 07/28/1960    Years since quitting: 59.5  . Smokeless tobacco: Never Used  Vaping Use  . Vaping Use: Never used  Substance Use Topics  . Alcohol use: No  . Drug use: No    Review of Systems  Review of Systems  Constitutional: Positive for fatigue. Negative for chills and fever.  HENT: Negative for sore throat.   Respiratory: Negative for shortness of breath.   Cardiovascular: Negative for chest pain.  Gastrointestinal: Negative for abdominal pain.  Genitourinary: Negative for flank pain.  Musculoskeletal: Negative for neck pain.  Skin: Negative for rash and wound.  Allergic/Immunologic: Negative for immunocompromised state.  Neurological: Positive for light-headedness. Negative for weakness and numbness.  Hematological: Does not bruise/bleed easily.     ____________________________________________  PHYSICAL EXAM:      VITAL SIGNS: ED Triage Vitals  Enc Vitals Group     BP 02/07/20 1353 125/62     Pulse Rate 02/07/20 1411 79     Resp 02/07/20 1406 14     Temp 02/07/20 1356 97.7 F (36.5 C)     Temp Source  02/07/20 1356 Oral     SpO2 02/07/20 1411 98 %     Weight 02/07/20 1357 140 lb (63.5 kg)     Height 02/07/20 1357 5\' 9"  (1.753 m)     Head Circumference --      Peak Flow --  Pain Score 02/07/20 1357 8     Pain Loc --      Pain Edu? --      Excl. in [place]? --      Physical Exam Vitals and nursing note reviewed.  Constitutional:      General: He is not in acute distress.    Appearance: He is well-developed.  HENT:     Head: Normocephalic and atraumatic.     Mouth/Throat:     Mouth: Mucous membranes are dry.  Eyes:     Conjunctiva/sclera: Conjunctivae normal.  Cardiovascular:     Rate and Rhythm: Normal rate and regular rhythm.     Heart sounds: Normal heart sounds.  Pulmonary:     Effort: Pulmonary effort is normal. No respiratory distress.     Breath sounds: No wheezing.  Abdominal:     General: There is no distension.  Musculoskeletal:     Cervical back: Neck supple.  Skin:    General: Skin is warm.     Capillary Refill: Capillary refill takes less than 2 seconds.     Findings: No rash.  Neurological:     Mental Status: He is alert and oriented to person, place, and time.     Motor: No abnormal muscle tone.       ____________________________________________   LABS (all labs ordered are listed, but only abnormal results are displayed)  Labs Reviewed  CBC WITH DIFFERENTIAL/PLATELET - Abnormal; Notable for the following components:      Result Value   RBC 3.30 (*)    Hemoglobin 10.7 (*)    HCT 32.8 (*)    RDW 16.0 (*)    All other components within normal limits  BRAIN NATRIURETIC PEPTIDE - Abnormal; Notable for the following components:   B Natriuretic Peptide 112.8 (*)    All other components within normal limits  COMPREHENSIVE METABOLIC PANEL - Abnormal; Notable for the following components:   Chloride 113 (*)    Glucose, Bld 100 (*)    BUN 24 (*)    Calcium 8.0 (*)    Total Protein 5.4 (*)    Albumin 2.9 (*)    GFR calc non Af Amer 54 (*)    All  other components within normal limits  URINALYSIS, COMPLETE (UACMP) WITH MICROSCOPIC - Abnormal; Notable for the following components:   Color, Urine AMBER (*)    APPearance TURBID (*)    Hgb urine dipstick LARGE (*)    Protein, ur 100 (*)    Leukocytes,Ua LARGE (*)    RBC / HPF >50 (*)    WBC, UA >50 (*)    Bacteria, UA RARE (*)    All other components within normal limits  URINE CULTURE  TROPONIN I (HIGH SENSITIVITY)  TROPONIN I (HIGH SENSITIVITY)    ____________________________________________  EKG: Normal sinus rhythm, VR 63. QRS 102, QTc 487. No acute St elevation or depression. No ischemia or infarct. ________________________________________  RADIOLOGY All imaging, including plain films, CT scans, and ultrasounds, independently reviewed by me, and interpretations confirmed via formal radiology reads.  ED MD interpretation:   None  Official radiology report(s): No results found.  ____________________________________________  PROCEDURES   Procedure(s) performed (including Critical Care):  Procedures  ____________________________________________  INITIAL IMPRESSION / MDM / [place] / ED COURSE  As part of my medical decision making, I reviewed the following data within the [facility] notes reviewed and incorporated, Old chart reviewed, Notes from prior ED visits, and [place] Controlled  Substance Database       *ZACCAI [name] was evaluated in Emergency Department on 02/07/2020 for the symptoms described in the history of present illness. He was evaluated in the context of the global COVID-19 pandemic, which necessitated consideration that the patient might be at risk for infection with the SARS-CoV-2 virus that causes COVID-19. Institutional protocols and algorithms that pertain to the evaluation of patients at risk for COVID-19 are in a state of rapid change based on information released by regulatory bodies including the CDC and  federal and state organizations. These policies and algorithms were followed during the patient's care in the ED.  Some ED evaluations and interventions may be delayed as a result of limited staffing during the pandemic.*     Medical Decision Making:  84 yo M with PMHx above here with dizziness upon standing. Pt has a long h/o recurrent dehydration, orthostasis in setting of poor PO intake. Pt markedly orthostatic on arrival but feels much improved with IVF. Admits too poor PO intake. Lab work is very reassuring - normal WBC, baseline Hgb. Trop neg, EKG nonischemic - doubt ACS. Renal function is at baseline. UA shows +pyuria, hematuria and pt has long h/o UTIs - will treat. Otherwise, family states pt is at baseline and he feels much better in the ED, is ambulatory without assistance. He would like to try tx as an outpatient which I think is reasonable. Will cover broadly, encourage fluids, d/c. ____________________________________________  FINAL CLINICAL IMPRESSION(S) / ED DIAGNOSES  Final diagnoses:  Dehydration  Acute cystitis without hematuria     MEDICATIONS GIVEN DURING THIS VISIT:  Medications  sodium chloride 0.9 % bolus 500 mL (0 mLs Intravenous Stopped 02/07/20 1459)  cefTRIAXone (ROCEPHIN) 2 g in sodium chloride 0.9 % 100 mL IVPB (0 g Intravenous Stopped 02/07/20 1909)  sodium chloride 0.9 % bolus 250 mL (0 mLs Intravenous Stopped 02/07/20 1912)     ED Discharge Orders         Ordered    cefdinir (OMNICEF) 300 MG capsule  2 times daily     Discontinue  Reprint     02/07/20 1848           Note:  This document was prepared using Dragon voice recognition software and may include unintentional dictation errors.   Duffy Bruce, MD 02/07/20 2030

## 2020-02-07 NOTE — ED Notes (Signed)
Sending 2nd grn/lav tubes per labs request.

## 2020-02-07 NOTE — ED Notes (Signed)
This tech and John, EDT assisted pt to get up from bed. Pt ambulated w/ no assistance w/ walker. These techs were stand by assist. [place], RN aware.

## 2020-02-07 NOTE — ED Notes (Addendum)
John and [place] NT walking with pt now. Using walker.

## 2020-02-07 NOTE — ED Notes (Signed)
Pt given another warm blanket.  

## 2020-02-10 ENCOUNTER — Other Ambulatory Visit: Payer: Self-pay | Admitting: Family Medicine

## 2020-02-10 DIAGNOSIS — D509 Iron deficiency anemia, unspecified: Secondary | ICD-10-CM

## 2020-02-10 LAB — URINE CULTURE: Culture: >=100000 — AB

## 2020-02-11 NOTE — Progress Notes (Signed)
ED Antimicrobial Stewardship Positive Culture Follow Up   Carl [name] is an 84 y.o. male who presented to [facility] on 02/07/2020 with a chief complaint of  Chief Complaint  Patient presents with  . hypotension    Recent Results (from the past 720 hour(s))  Urine Culture     Status: None   Collection Time: 01/17/20 12:30 PM   Specimen: Blood  Result Value Ref Range Status   MICRO NUMBER: [id number]  Final   SPECIMEN QUALITY: Adequate  Final   Sample Source NOT GIVEN  Final   STATUS: FINAL  Final   ISOLATE 1:   Final    Growth of mixed flora was isolated, suggesting probable contamination. No further testing will be performed. If clinically indicated, recollection using a method to minimize contamination, with prompt transfer to Urine Culture Transport Tube, is  recommended.   Urine culture     Status: Abnormal   Collection Time: 02/07/20  5:10 PM   Specimen: Urine, Random  Result Value Ref Range Status   Specimen Description   Final    URINE, RANDOM Performed at [facility], [address], [place], [place] [address]    Special Requests   Final    NONE Performed at [facility], [address] [place], [place] [address]    Culture >=100,000 COLONIES/mL PSEUDOMONAS AERUGINOSA (A)  Final   Report Status 02/10/2020 FINAL  Final   Organism ID, Bacteria PSEUDOMONAS AERUGINOSA (A)  Final      Susceptibility   Pseudomonas aeruginosa - MIC*    CEFTAZIDIME 4 SENSITIVE Sensitive     CIPROFLOXACIN <=0.25 SENSITIVE Sensitive     GENTAMICIN 2 SENSITIVE Sensitive     IMIPENEM 2 SENSITIVE Sensitive     PIP/TAZO <=4 SENSITIVE Sensitive     CEFEPIME 2 SENSITIVE Sensitive     * >=100,000 COLONIES/mL PSEUDOMONAS AERUGINOSA    [x]  Treated with cefdinir, organism resistant to prescribed antimicrobial []  Patient discharged originally without antimicrobial agent and treatment is now indicated  New antibiotic prescription: None. Only oral treatment options  include fluoroquinolones which are contraindicated based on patient information (history of atrial fibrillation on amiodarone). Patient has a long history of PSA in urine. Spoke with patient and he is asymptomatic at this time. Return precautions discussed.   ED Provider: Dr. Donnal Moat 02/11/2020, 2:03 PM

## 2020-02-13 ENCOUNTER — Telehealth: Payer: Self-pay | Admitting: Family Medicine

## 2020-02-13 NOTE — Telephone Encounter (Signed)
Called to give verbal orders to amy with [facility] and VM has not been set up.  Azeneth Carbonell,cma

## 2020-02-13 NOTE — Telephone Encounter (Signed)
Verbal orders can be given. 

## 2020-02-13 NOTE — Telephone Encounter (Signed)
Carl [name] with [facility] called and wants to request one additional PT visit for next week. Pt was in the ED last week and she wants to make sure he gets that extra visit. Please advise [phone]

## 2020-02-14 ENCOUNTER — Ambulatory Visit (INDEPENDENT_AMBULATORY_CARE_PROVIDER_SITE_OTHER): Payer: Medicare Other | Admitting: Family Medicine

## 2020-02-14 ENCOUNTER — Encounter: Payer: Self-pay | Admitting: Family Medicine

## 2020-02-14 ENCOUNTER — Other Ambulatory Visit: Payer: Self-pay

## 2020-02-14 VITALS — BP 150/70 | HR 100 | Temp 97.6°F | Ht 68.0 in | Wt 139.6 lb

## 2020-02-14 DIAGNOSIS — Z87440 Personal history of urinary (tract) infections: Secondary | ICD-10-CM

## 2020-02-14 DIAGNOSIS — N179 Acute kidney failure, unspecified: Secondary | ICD-10-CM | POA: Diagnosis not present

## 2020-02-14 DIAGNOSIS — I951 Orthostatic hypotension: Secondary | ICD-10-CM

## 2020-02-14 DIAGNOSIS — K5900 Constipation, unspecified: Secondary | ICD-10-CM

## 2020-02-14 DIAGNOSIS — C61 Malignant neoplasm of prostate: Secondary | ICD-10-CM | POA: Diagnosis not present

## 2020-02-14 LAB — BASIC METABOLIC PANEL
BUN: 21 mg/dL (ref 6–23)
CO2: 26 mEq/L (ref 19–32)
Calcium: 9 mg/dL (ref 8.4–10.5)
Chloride: 107 mEq/L (ref 96–112)
Creatinine, Ser: 1.25 mg/dL (ref 0.40–1.50)
GFR: 55.26 mL/min — ABNORMAL LOW (ref >60.00)
Glucose, Bld: 116 mg/dL — ABNORMAL HIGH (ref 70–99)
Potassium: 3.9 mEq/L (ref 3.5–5.1)
Sodium: 141 mEq/L (ref 135–145)

## 2020-02-14 NOTE — Assessment & Plan Note (Signed)
Benign abdominal exam.  Discussed taking MiraLAX twice daily to see if that is beneficial.

## 2020-02-14 NOTE — Assessment & Plan Note (Signed)
Given patient's lack of symptoms I suspect he is colonized.  No need for antibiotics at this time.  He will monitor for symptoms.

## 2020-02-14 NOTE — Telephone Encounter (Signed)
I called and spoke with Amy and gave her a verbal order for this patient to have an extra PT visit.  Leshaun Biebel,cma

## 2020-02-14 NOTE — Assessment & Plan Note (Signed)
Patient with orthostasis in the ED.  Symptomatically has improved to a certain degree with improving oral intake.  I encouraged 60 ounces of water a day and increasing food intake.  Discussed rising slowly from a seated position.  They will monitor.

## 2020-02-14 NOTE — Progress Notes (Signed)
[name], MD Phone: [phone]  Carl [name] is a 84 y.o. male who presents today for follow-up.  Dehydration/orthostasis: Patient was evaluated in the emergency department.  He became significantly lightheaded and was found to be significantly orthostatic by EMS.  He had been taking in an adequate amounts of fluid and food.  He received IV fluids in the ED and his symptoms did improve to a certain degree.  He continues to have some lightheadedness when he gets up though it is improved from previously.  No syncope.  No falls.  They have increased his water intake to about 40 ounces a day.  He is eating better as well.  History of UTI: Urine culture in the ED grew out Pseudomonas.  The patient denies that he ever had any symptoms.  No dysuria, frequency, or urgency.  He is no longer on antibiotics.  Constipation: Patient notes bowel movements every 3 days.  He is alternating MiraLAX with a stool softener.  No abdominal discomfort.  History of prostate cancer: There are still actively pursuing treatment.  He is seeing urology for this.  He does have palliative care on board.  Social History   Tobacco Use  Smoking Status Former Smoker   Years: 2.00   Types: Cigarettes   Quit date: 07/28/1960   Years since quitting: 59.5  Smokeless Tobacco Never Used     ROS see history of present illness  Objective  Physical Exam Vitals:   02/14/20 1221  BP: (!) 150/70  Pulse: 100  Temp: 97.6 F (36.4 C)  SpO2: 97%    BP Readings from Last 3 Encounters:  02/14/20 (!) 150/70  02/07/20 134/72  02/03/20 (!) 98/54   Wt Readings from Last 3 Encounters:  02/14/20 139 lb 9.6 oz (63.3 kg)  02/07/20 140 lb (63.5 kg)  02/03/20 137 lb 6 oz (62.3 kg)    Physical Exam Constitutional:      General: He is not in acute distress.    Appearance: He is not diaphoretic.  Cardiovascular:     Rate and Rhythm: Normal rate and regular rhythm.     Heart sounds: Normal heart sounds.    Pulmonary:     Effort: Pulmonary effort is normal.     Breath sounds: Normal breath sounds.  Abdominal:     General: Bowel sounds are normal. There is no distension.     Palpations: Abdomen is soft.     Tenderness: There is no abdominal tenderness. There is no guarding or rebound.  Skin:    General: Skin is warm and dry.  Neurological:     Mental Status: He is alert.      Assessment/Plan: Please see individual problem list.  Orthostatic hypotension Patient with orthostasis in the ED.  Symptomatically has improved to a certain degree with improving oral intake.  I encouraged 60 ounces of water a day and increasing food intake.  Discussed rising slowly from a seated position.  They will monitor.  Prostate cancer [facility]) He will continue to see urology.  History of UTI Given patient's lack of symptoms I suspect he is colonized.  No need for antibiotics at this time.  He will monitor for symptoms.  Constipation Benign abdominal exam.  Discussed taking MiraLAX twice daily to see if that is beneficial.    Orders Placed This Encounter  Procedures   Basic Metabolic Panel (BMET)    No orders of the defined types were placed in this encounter.   This visit occurred during the  SARS-CoV-2 public health emergency.  Safety protocols were in place, including screening questions prior to the visit, additional usage of staff PPE, and extensive cleaning of exam room while observing appropriate contact time as indicated for disinfecting solutions.    [name], MD [place]

## 2020-02-14 NOTE — Patient Instructions (Signed)
Nice to see you. We will get some labs today. Please try to get 60 ounces of water daily. Please see what ever high calorie foods you would like. Please try the MiraLAX twice daily.

## 2020-02-14 NOTE — Assessment & Plan Note (Signed)
He will continue to see urology. 

## 2020-02-15 ENCOUNTER — Telehealth: Payer: Self-pay | Admitting: Nurse Practitioner

## 2020-02-15 NOTE — Telephone Encounter (Signed)
Rec'd call from daughter, [name], and she wanted to cancel tomorrow's appointment due to she has an appointment with MD tomorrow.  I offered to reschedule the visit and she said that she would call me back tomorrow afternoon to reschedule.  Notified NP.

## 2020-02-16 ENCOUNTER — Other Ambulatory Visit: Payer: Medicare Other | Admitting: Nurse Practitioner

## 2020-02-21 ENCOUNTER — Other Ambulatory Visit: Payer: Self-pay | Admitting: Urology

## 2020-02-21 DIAGNOSIS — M8580 Other specified disorders of bone density and structure, unspecified site: Secondary | ICD-10-CM

## 2020-02-21 DIAGNOSIS — C61 Malignant neoplasm of prostate: Secondary | ICD-10-CM

## 2020-02-22 ENCOUNTER — Telehealth: Payer: Self-pay | Admitting: Family Medicine

## 2020-02-22 ENCOUNTER — Telehealth: Payer: Self-pay | Admitting: Nurse Practitioner

## 2020-02-22 NOTE — Telephone Encounter (Signed)
Spoke with daughter, [name], and have rescheduled the Palliative Consult for 03/16/20 @ 8:30 AM.

## 2020-02-22 NOTE — Telephone Encounter (Signed)
She thought patient was having diarrhea but he told her when we were on the phone that he isn't constantly going so she said she will call back if she has any other issues.

## 2020-02-23 ENCOUNTER — Ambulatory Visit
Admission: RE | Admit: 2020-02-23 | Discharge: 2020-02-23 | Disposition: A | Discharge status: Home / Self Care | Payer: Medicare Other | Source: Ambulatory Visit | Attending: Urology | Admitting: Urology

## 2020-02-23 ENCOUNTER — Other Ambulatory Visit: Payer: Self-pay

## 2020-02-23 DIAGNOSIS — C61 Malignant neoplasm of prostate: Secondary | ICD-10-CM

## 2020-02-23 DIAGNOSIS — M8580 Other specified disorders of bone density and structure, unspecified site: Secondary | ICD-10-CM

## 2020-03-07 ENCOUNTER — Other Ambulatory Visit: Payer: Self-pay | Admitting: Family Medicine

## 2020-03-07 DIAGNOSIS — D509 Iron deficiency anemia, unspecified: Secondary | ICD-10-CM

## 2020-03-08 ENCOUNTER — Telehealth: Payer: Self-pay | Admitting: Nurse Practitioner

## 2020-03-08 ENCOUNTER — Other Ambulatory Visit: Payer: Self-pay | Admitting: Family Medicine

## 2020-03-08 DIAGNOSIS — R109 Unspecified abdominal pain: Secondary | ICD-10-CM

## 2020-03-08 NOTE — Telephone Encounter (Signed)
Rec'd call from daughter on 03/07/20 wanting to cancel and reschedule the 03/16/20 Initial Palliative Consult, daughter said that I could call her back to reschedule.  Called daughter back today and have rescheduled the Consult for 03/28/20 @ 8:30 AM

## 2020-03-16 ENCOUNTER — Other Ambulatory Visit: Payer: Medicare Other | Admitting: Nurse Practitioner

## 2020-03-23 ENCOUNTER — Ambulatory Visit: Payer: Medicare Other | Admitting: Podiatry

## 2020-03-27 ENCOUNTER — Telehealth: Payer: Self-pay | Admitting: Nurse Practitioner

## 2020-03-27 NOTE — Telephone Encounter (Signed)
Rec'd voicemail from daughter [name], needing to cancel Palliative Consult for tomorrow, due to she has physical therapy appointment.  I will call her back to reschedule.

## 2020-03-28 ENCOUNTER — Other Ambulatory Visit: Payer: Medicare Other | Admitting: Nurse Practitioner

## 2020-04-06 ENCOUNTER — Ambulatory Visit: Payer: Medicare Other | Admitting: Podiatry

## 2020-04-10 ENCOUNTER — Ambulatory Visit: Payer: Medicare Other | Admitting: Podiatry

## 2020-04-17 ENCOUNTER — Ambulatory Visit: Payer: Medicare Other | Admitting: Podiatry

## 2020-04-25 ENCOUNTER — Ambulatory Visit: Payer: Medicare Other

## 2020-04-26 ENCOUNTER — Other Ambulatory Visit: Payer: Medicare Other | Admitting: Nurse Practitioner

## 2020-04-26 ENCOUNTER — Telehealth: Payer: Self-pay | Admitting: Nurse Practitioner

## 2020-04-26 ENCOUNTER — Other Ambulatory Visit: Payer: Self-pay

## 2020-04-26 NOTE — Telephone Encounter (Signed)
I called [name] to confirm PC visit today; [name] endorses request to reschedule, reschedule per request, contact information

## 2020-05-01 ENCOUNTER — Ambulatory Visit (INDEPENDENT_AMBULATORY_CARE_PROVIDER_SITE_OTHER): Payer: Medicare Other

## 2020-05-01 VITALS — Ht 68.0 in | Wt 139.0 lb

## 2020-05-01 DIAGNOSIS — Z0000 Encounter for general adult medical examination without abnormal findings: Secondary | ICD-10-CM | POA: Diagnosis not present

## 2020-05-01 NOTE — Patient Instructions (Addendum)
[name] , Thank you for taking time to come for your Medicare Wellness Visit. I appreciate your ongoing commitment to your health goals. Please review the following plan we discussed and let me know if I can assist you in the future.   These are the goals we discussed:  Follow up with primary care provider as needed.    This is a list of the screening recommended for you and due dates:  Health Maintenance  Topic Date Due  . COVID-19 Vaccine (1) Never done  . Pneumonia vaccines (2 of 2 - PCV13) 01/07/2019  . Flu Shot  02/26/2020  . Tetanus Vaccine  01/12/2028    Immunizations Immunization History  Administered Date(s) Administered  . Fluad Quad(high Dose 65+) 05/11/2019  . Influenza, High Dose Seasonal PF 05/25/2017, 04/23/2018  . Influenza-Unspecified 06/01/2014  . Pneumococcal Polysaccharide-23 01/06/2018  . Tdap 01/11/2018   Follow up in one year for your annual wellness visit.   Preventive Care 30 Years and Older, Male Preventive care refers to lifestyle choices and visits with your health care provider that can promote health and wellness. What does preventive care include?  A yearly physical exam. This is also called an annual well check.  Dental exams once or twice a year.  Routine eye exams. Ask your health care provider how often you should have your eyes checked.  Personal lifestyle choices, including:  Daily care of your teeth and gums.  Regular physical activity.  Eating a healthy diet.  Avoiding tobacco and drug use.  Limiting alcohol use.  Practicing safe sex.  Taking low doses of aspirin every day.  Taking vitamin and mineral supplements as recommended by your health care provider. What happens during an annual well check? The services and screenings done by your health care provider during your annual well check will depend on your age, overall health, lifestyle risk factors, and family history of disease. Counseling  Your health care  provider may ask you questions about your:  Alcohol use.  Tobacco use.  Drug use.  Emotional well-being.  Home and relationship well-being.  Sexual activity.  Eating habits.  History of falls.  Memory and ability to understand (cognition).  Work and work Statistician. Screening  You may have the following tests or measurements:  Height, weight, and BMI.  Blood pressure.  Lipid and cholesterol levels. These may be checked every 5 years, or more frequently if you are over 37 years old.  Skin check.  Lung cancer screening. You may have this screening every year starting at age 60 if you have a 30-pack-year history of smoking and currently smoke or have quit within the past 15 years.  Fecal occult blood test (FOBT) of the stool. You may have this test every year starting at age 47.  Flexible sigmoidoscopy or colonoscopy. You may have a sigmoidoscopy every 5 years or a colonoscopy every 10 years starting at age 63.  Prostate cancer screening. Recommendations will vary depending on your family history and other risks.  Hepatitis C blood test.  Hepatitis B blood test.  Sexually transmitted disease (STD) testing.  Diabetes screening. This is done by checking your blood sugar (glucose) after you have not eaten for a while (fasting). You may have this done every 1-3 years.  Abdominal aortic aneurysm (AAA) screening. You may need this if you are a current or former smoker.  Osteoporosis. You may be screened starting at age 74 if you are at high risk. Talk with your health care provider  about your test results, treatment options, and if necessary, the need for more tests. Vaccines  Your health care provider may recommend certain vaccines, such as:  Influenza vaccine. This is recommended every year.  Tetanus, diphtheria, and acellular pertussis (Tdap, Td) vaccine. You may need a Td booster every 10 years.  Zoster vaccine. You may need this after age 106.  Pneumococcal  13-valent conjugate (PCV13) vaccine. One dose is recommended after age 49.  Pneumococcal polysaccharide (PPSV23) vaccine. One dose is recommended after age 65. Talk to your health care provider about which screenings and vaccines you need and how often you need them. This information is not intended to replace advice given to you by your health care provider. Make sure you discuss any questions you have with your health care provider. Document Released: 08/10/2015 Document Revised: 04/02/2016 Document Reviewed: 05/15/2015 Elsevier Interactive Patient Education  2017 [place] Prevention in the Home Falls can cause injuries. They can happen to people of all ages. There are many things you can do to make your home safe and to help prevent falls. What can I do on the outside of my home?  Regularly fix the edges of walkways and driveways and fix any cracks.  Remove anything that might make you trip as you walk through a door, such as a raised step or threshold.  Trim any bushes or trees on the path to your home.  Use bright outdoor lighting.  Clear any walking paths of anything that might make someone trip, such as rocks or tools.  Regularly check to see if handrails are loose or broken. Make sure that both sides of any steps have handrails.  Any raised decks and porches should have guardrails on the edges.  Have any leaves, snow, or ice cleared regularly.  Use sand or salt on walking paths during winter.  Clean up any spills in your garage right away. This includes oil or grease spills. What can I do in the bathroom?  Use night lights.  Install grab bars by the toilet and in the tub and shower. Do not use towel bars as grab bars.  Use non-skid mats or decals in the tub or shower.  If you need to sit down in the shower, use a plastic, non-slip stool.  Keep the floor dry. Clean up any water that spills on the floor as soon as it happens.  Remove soap buildup in the  tub or shower regularly.  Attach bath mats securely with double-sided non-slip rug tape.  Do not have throw rugs and other things on the floor that can make you trip. What can I do in the bedroom?  Use night lights.  Make sure that you have a light by your bed that is easy to reach.  Do not use any sheets or blankets that are too big for your bed. They should not hang down onto the floor.  Have a firm chair that has side arms. You can use this for support while you get dressed.  Do not have throw rugs and other things on the floor that can make you trip. What can I do in the kitchen?  Clean up any spills right away.  Avoid walking on wet floors.  Keep items that you use a lot in easy-to-reach places.  If you need to reach something above you, use a strong step stool that has a grab bar.  Keep electrical cords out of the way.  Do not use floor polish or  wax that makes floors slippery. If you must use wax, use non-skid floor wax.  Do not have throw rugs and other things on the floor that can make you trip. What can I do with my stairs?  Do not leave any items on the stairs.  Make sure that there are handrails on both sides of the stairs and use them. Fix handrails that are broken or loose. Make sure that handrails are as long as the stairways.  Check any carpeting to make sure that it is firmly attached to the stairs. Fix any carpet that is loose or worn.  Avoid having throw rugs at the top or bottom of the stairs. If you do have throw rugs, attach them to the floor with carpet tape.  Make sure that you have a light switch at the top of the stairs and the bottom of the stairs. If you do not have them, ask someone to add them for you. What else can I do to help prevent falls?  Wear shoes that:  Do not have high heels.  Have rubber bottoms.  Are comfortable and fit you well.  Are closed at the toe. Do not wear sandals.  If you use a stepladder:  Make sure that it  is fully opened. Do not climb a closed stepladder.  Make sure that both sides of the stepladder are locked into place.  Ask someone to hold it for you, if possible.  Clearly mark and make sure that you can see:  Any grab bars or handrails.  First and last steps.  Where the edge of each step is.  Use tools that help you move around (mobility aids) if they are needed. These include:  Canes.  Walkers.  Scooters.  Crutches.  Turn on the lights when you go into a dark area. Replace any light bulbs as soon as they burn out.  Set up your furniture so you have a clear path. Avoid moving your furniture around.  If any of your floors are uneven, fix them.  If there are any pets around you, be aware of where they are.  Review your medicines with your doctor. Some medicines can make you feel dizzy. This can increase your chance of falling. Ask your doctor what other things that you can do to help prevent falls. This information is not intended to replace advice given to you by your health care provider. Make sure you discuss any questions you have with your health care provider. Document Released: 05/10/2009 Document Revised: 12/20/2015 Document Reviewed: 08/18/2014 Elsevier Interactive Patient Education  2017 [employer].

## 2020-05-01 NOTE — Progress Notes (Signed)
Subjective:   Carl [name] is a 84 y.o. male who presents for Medicare Annual (Subsequent) preventive examination.  Review of Systems    No ROS.  Medicare Wellness Virtual Visit.        Objective:    Today's Vitals   05/01/20 1247  Weight: 139 lb (63 kg)  Height: 5\' 8"  (1.727 m)   Body mass index is 21.13 kg/m.  Advanced Directives 05/01/2020 02/07/2020 12/19/2019 06/15/2019 06/14/2019 04/25/2019 03/08/2019  Does Patient Have a Medical Advance Directive? No Yes Yes Yes Yes Yes No  Type of Advance Directive - Out of facility DNR (pink MOST or yellow form) Healthcare Power of [place] will - Living will -  Does patient want to make changes to medical advance directive? - - No - Patient declined No - Guardian declined No - Patient declined No - Patient declined No - Patient declined  Copy of [place] in Chart? - - No - copy requested - - - -  Would patient like information on creating a medical advance directive? No - Patient declined - - - - - No - Patient declined    Current Medications (verified) Outpatient Encounter Medications as of 05/01/2020  Medication Sig   acetaminophen (TYLENOL) 500 MG tablet Take 1,000 mg by mouth every 6 (six) hours as needed for mild pain (for pain).    amiodarone (PACERONE) 200 MG tablet Take 1 tablet (200 mg total) by mouth daily.   apixaban (ELIQUIS) 5 MG TABS tablet Take 1 tablet (5 mg total) by mouth 2 (two) times daily.   ferrous sulfate 325 (65 FE) MG tablet TAKE 1 TABLET BY MOUTH EVERY DAY WITH BREAKFAST   Hydrocortisone (GERHARDT'S BUTT CREAM) CREA Apply 1 application topically 4 (four) times daily.   lactose free nutrition (BOOST) LIQD Take 237 mLs by mouth. Drinks on occassion   NUBEQA 300 MG tablet Take 600 mg by mouth 2 (two) times daily.   omeprazole (PRILOSEC) 40 MG capsule TAKE 1 CAPSULE BY MOUTH EVERY DAY   polyethylene glycol (MIRALAX / GLYCOLAX) 17 g packet Take 17 g by mouth daily.    rosuvastatin (CRESTOR) 40 MG tablet Take 1 tablet (40 mg total) by mouth daily.   tamsulosin (FLOMAX) 0.4 MG CAPS capsule Take 1 capsule (0.4 mg total) by mouth daily after supper.   No facility-administered encounter medications on file as of 05/01/2020.    Allergies (verified) Ciprofloxacin and Ibuprofen   History: Past Medical History:  Diagnosis Date   (HFpEF) heart failure with preserved ejection fraction ([place])    a. 08/2016 Echo: EF 50-55%.   AAA (abdominal aortic aneurysm) ([place]) 06/05/2019   abdominal aortic aneurysm measuring 3.1 cm.   Arthritis    arms   Bladder calculus    Chronic anemia    Chronic anxiety    CKD (chronic kidney disease), stage III ([place])    Coronary artery disease CARDIOLOGIST-  DR GOLLAN   a. CABG 2006 with  LIMA-LAD, SVG-[place], SVG-OM, seq SVG-PLV-PDA.   DOE (dyspnea on exertion)    Frequent falls 11/2019   GERD (gastroesophageal reflux disease)    Heart murmur    Hematuria    Hip fracture ([place]) 10/05/2018   History of bladder stone    History of colon polyps    History of non-ST elevation myocardial infarction (NSTEMI) 03/30/2005   post op recovery inguinal hernia repair   History of pressure ulcer    History of recurrent UTIs    last admission  08-04-2018 pseudomonas UTI   Hyperlipidemia    Hyperplasia of prostate without lower urinary tract symptoms (LUTS)    Hypertension    Indwelling Foley catheter present    Intracranial hematoma ([place]) 10/05/2018   Ischemic cardiomyopathy    a. echo (01/13) 45-50% septal akinesis, mild MR; b. Echo  (01/16) ef 60-65%, mid-apical anteroseptal hypokinesis; c. echo 09-16-2016 ef 50-55%   Left ureteral stone    Lung nodule    noted 08/ 2018;  followed by pcp   LVH (left ventricular hypertrophy) 09/16/2016   Moderate noted on ECHO   Moderate mitral regurgitation    a.08/2016 Echo: Mod MR/TR.   Orthostatic hypotension    PAC (premature atrial contraction)    Paroxysmal atrial  flutter ([place])    a. dx 08/2016, started on amiodarone.   Persistent atrial fibrillation [facility]) followed by cardiologist-- dr Rockey Situ   a. dx AFib 08/2016 s/p DCCV 10/2016-->eliquis (CHA2DS2VASc = 5).   Pneumonia    Pulmonary hypertension ([place])    last echo in epic 09-16-2016   Recurrent prostate cancer ([place]) UROLOGIST-- DR BELL   s/p  radioactive prostate seed implants in 1997;    due to PSA rising pt has been doing lupron injection's few past several yrs at dr bell office   Renal calculi    bilateral per CT 06-18-2018   S/P CABG x 5 04/04/2005   LIMA to LAD,  SVG to Ramus,  SVG to OM,  seqSVG to PLV and PDA   Wears glasses    Past Surgical History:  Procedure Laterality Date   CARDIAC CATHETERIZATION  04/02/2005   dr Doreatha Lew    Critical three-vessel coronary disease --  Essentially normal left ventricular function , ef 55%   CARDIOVASCULAR STRESS TEST  05-01-2008  dr Doreatha Lew /  dr harding   Low risk nuclear study w/ no ischemia or scar but flattened septal motion/  ef 64%   CARDIOVERSION N/A 11/19/2016   Procedure: CARDIOVERSION;  Surgeon: Jerline Pain, MD;  Location: [facility];  Service: Cardiovascular;  Laterality: N/A;   COLONOSCOPY N/A 10/23/2016   Procedure: COLONOSCOPY;  Surgeon: Milus Banister, MD;  Location: [facility];  Service: Endoscopy;  Laterality: N/A;   CORONARY ARTERY BYPASS GRAFT  04/04/2005   dr  Ricard Dillon   LIMA - LAD,  SVG - Ramus, SVG - OM,  seqSVG - PLV and PDA   CYSTOSCOPY WITH LITHOLAPAXY N/A 05/07/2016   Procedure: CYSTOSCOPY WITH LITHOLAPAXY;  Surgeon: Rana Snare, MD;  [facility];  Service: Urology;  Laterality: N/A;   CYSTOSCOPY WITH RETROGRADE PYELOGRAM, URETEROSCOPY AND STENT PLACEMENT  09/03/2018   Procedure: CYSTOSCOPY WITH RETROGRADE PYELOGRAM, URETEROSCOPY LASER LITHOTRIPSY AND STENT PLACEMENT;  Surgeon: Lucas Mallow, MD;  Location: [facility];  Service: Urology;;   CYSTOSCOPY/URETEROSCOPY/HOLMIUM LASER/STENT  PLACEMENT Right 06/15/2019   Procedure: CYSTOSCOPY RIGHT URETEROSCOPY HOLMIUM LASER STENT PLACEMENT;  Surgeon: Lucas Mallow, MD;  Location: [facility];  Service: Urology;  Laterality: Right;   HOLMIUM LASER APPLICATION N/A 74/02/1447   Procedure: HOLMIUM LASER APPLICATION;  Surgeon: Rana Snare, MD;  [facility];  Service: Urology;  Laterality: N/A;   INGUINAL HERNIA REPAIR Left 03-30-2005  dr Excell Seltzer   incarcerated    [place] Right 10/07/2018   Procedure: TOTAL HIP ARTHROPLASTY ANTERIOR APPROACH;  Surgeon: Rod Can, MD;  Location: [facility];  Service: Orthopedics;  Laterality: Right;   TRANSTHORACIC ECHOCARDIOGRAM  08/24/2014   mid anteroseptal and distal septak hypokinetic,  mild focal basal LVH, ef 60-65%,  grade 1 diastolic dysfunction/  mild MR/  mild LAE/  mild dilated RV with normal RVSP/  trivial TR   Family History  Problem Relation Age of Onset   Heart disease Mother    Cancer Mother    Cancer Father    Cancer Brother    Social History   Socioeconomic History   Marital status: Widowed    Spouse name: Not on file   Number of children: 3   Years of education: Not on file   Highest education level: Not on file  Occupational History   Occupation: retired  Tobacco Use   Smoking status: Former Smoker    Years: 2.00    Types: Cigarettes    Quit date: 07/28/1960    Years since quitting: 59.8   Smokeless tobacco: Never Used  Vaping Use   Vaping Use: Never used  Substance and Sexual Activity   Alcohol use: No   Drug use: No   Sexual activity: Not Currently  Other Topics Concern   Not on file  Social History Narrative   Widowed lives with daughter [name]   Social Determinants of Health   Financial Resource Strain: High Risk   Difficulty of Paying Living Expenses: Hard  Food Insecurity: No Food Insecurity   Worried About Charity fundraiser in the Last  Year: Never true   [place] in the Last Year: Never true  Transportation Needs: No Transportation Needs   Lack of Transportation (Medical): No   Lack of Transportation (Non-Medical): No  Physical Activity:    Days of Exercise per Week: Not on file   Minutes of Exercise per Session: Not on file  Stress: No Stress Concern Present   Feeling of Stress : Not at all  Social Connections: Unknown   Frequency of Communication with Friends and Family: Not on file   Frequency of Social Gatherings with Friends and Family: More than three times a week   Attends Religious Services: Not on Electrical engineer or Organizations: Not on file   Attends Archivist Meetings: Not on file   Marital Status: Not on file    Tobacco Counseling Counseling given: Not Answered   Clinical Intake:  Pre-visit preparation completed: Yes        Diabetes: No  How often do you need to have someone help you when you read instructions, pamphlets, or other written materials from your doctor or pharmacy?: 1 - Never  Interpreter Needed?: No      Activities of Daily Living In your present state of health, do you have any difficulty performing the following activities: 12/19/2019 06/14/2019  Hearing? N N  Vision? N N  Difficulty concentrating or making decisions? N N  Walking or climbing stairs? Y Y  Dressing or bathing? N Y  Doing errands, shopping? Y N  Some recent data might be hidden    Patient Care Team: [name], MD as PCP - General (Family Medicine) Minna Merritts, MD as PCP - Cardiology (Cardiology) De Hollingshead, RPH-CPP as Pharmacist (Pharmacist)  Indicate any recent Medical Services you may have received from other than [facility] providers in the past year (date may be approximate).     Assessment:   This is a routine wellness examination for [place].  I connected with Leone today by telephone and verified that I am speaking with the  correct person using two identifiers. Location patient: home Location provider: work Persons participating in the virtual visit: patient, Marine scientist.    I discussed the limitations, risks, security and privacy concerns of performing an evaluation and management service by telephone and the availability of in person appointments. The patient expressed understanding and verbally consented to this telephonic visit.    Interactive audio and video telecommunications were attempted between this provider and patient, however failed, due to patient having technical difficulties OR patient did not have access to video capability.  We continued and completed visit with audio only.  Some vital signs may be absent or patient reported.   Hearing/Vision screen No exam data present  Dietary issues and exercise activities discussed:     Depression Screen PHQ 2/9 Scores 05/01/2020 01/17/2020 08/30/2019 04/25/2019 03/04/2018 03/24/2017  PHQ - 2 Score 0 0 0 0 0 0    Fall Risk Fall Risk  08/30/2019 04/25/2019 06/18/2018 03/04/2018 03/24/2017  Falls in the past year? 0 1 0 No No  Number falls in past yr: 0 1 - - -  Injury with Fall? - 1 - - -  Follow up Falls evaluation completed - - - -   Handrails in use when climbing stairs? Yes Home free of loose throw rugs in walkways, pet beds, electrical cords, etc? Yes  Adequate lighting in your home to reduce risk of falls? Yes   ASSISTIVE DEVICES UTILIZED TO PREVENT FALLS: Use of a cane, walker or w/c? Yes   TIMED UP AND GO: Was the test performed? No . Virtual visit.    Cognitive Function: MMSE - Mini Mental State Exam 04/25/2019  Not completed: Unable to complete     6CIT Screen 03/04/2018  What Year? 0 points  What month? 0 points  What time? 0 points  Count back from 20 0 points  Months in reverse 0 points  Repeat phrase 0 points  Total Score 0    Immunizations Immunization History  Administered Date(s) Administered   Fluad Quad(high Dose 65+)  05/11/2019   Influenza, High Dose Seasonal PF 05/25/2017, 04/23/2018   Influenza-Unspecified 06/01/2014   Pneumococcal Polysaccharide-23 01/06/2018   Tdap 01/11/2018   Health Maintenance Health Maintenance  Topic Date Due   COVID-19 Vaccine (1) Never done   PNA vac Low Risk Adult (2 of 2 - PCV13) 01/07/2019   INFLUENZA VACCINE  02/26/2020   TETANUS/TDAP  01/12/2028   Dental Screening: Recommended annual dental exams for proper oral hygiene.  Community Resource Referral / Chronic Care Management: CRR required this visit?  No   CCM required this visit?  No      Plan:   Keep all routine maintenance appointments.   I have personally reviewed and noted the following in the patients chart:    Medical and social history  Use of alcohol, tobacco or illicit drugs   Current medications and supplements  Functional ability and status  Nutritional status  Physical activity  Advanced directives  List of other physicians  Hospitalizations, surgeries, and ER visits in previous 12 months  Vitals  Screenings to include cognitive, depression, and falls  Referrals and appointments  In addition, I have reviewed and discussed with patient certain preventive protocols, quality metrics, and best practice recommendations. A written personalized care plan for preventive services as well as general preventive health recommendations were provided to patient via mail.     Varney Biles, LPN   [id number]

## 2020-05-03 ENCOUNTER — Other Ambulatory Visit: Payer: Self-pay | Admitting: *Deleted

## 2020-05-03 MED ORDER — APIXABAN 5 MG PO TABS: 5.0000 mg | ORAL_TABLET | Freq: Two times a day (BID) | ORAL | 5 refills | Status: AC

## 2020-05-03 NOTE — Telephone Encounter (Signed)
Prescription refill request for Eliquis received.  Last office visit: Carl [name], 02/03/2020 Scr: 1.25, 02/14/2020 Age: 84 y.o. Weight: 63 kg   Prescription refill sent.

## 2020-05-10 ENCOUNTER — Other Ambulatory Visit: Payer: Self-pay

## 2020-05-10 ENCOUNTER — Other Ambulatory Visit: Payer: Medicare Other | Admitting: Nurse Practitioner

## 2020-05-10 ENCOUNTER — Encounter: Payer: Self-pay | Admitting: Nurse Practitioner

## 2020-05-10 DIAGNOSIS — Z515 Encounter for palliative care: Secondary | ICD-10-CM

## 2020-05-10 DIAGNOSIS — I272 Pulmonary hypertension, unspecified: Secondary | ICD-10-CM

## 2020-05-10 NOTE — Progress Notes (Signed)
.    Carl [name] Palliative Care Consult Note Telephone: [phone]  Fax: [phone]  PATIENT NAME: Carl [name] DOB: [birth year] MRN: [id number]  PRIMARY CARE PROVIDER:   [name], MD  REFERRING PROVIDER:  [name], MD [address] [place],  [place] [address]  RESPONSIBLE PARTY:    1. Advance Care Planning; DNR;   2. Goals of Care: Goals include to maximize quality of life and symptom management. Our advance care planning conversation included a discussion about:     The value and importance of advance care planning   Exploration of personal, cultural or spiritual beliefs that might influence medical decisions   Exploration of goals of care in the event of a sudden injury or illness   Identification and preparation of a healthcare agent   Review and updating or creation of an  advance directive document.  3. Pain lower back, continue to monitor on pain scale; Continue tylenol, will add Voltaren gel   Rx: Voltaren gel 1% apply qid to lower back as needed for pain:   4. Anorexia; will monitor appetite, weight, will try Remeron  Rx: Remeron 15mg  take 1/2 tablet 7.5mg  qhs; #30; No RF  5. Palliative care encounter; Palliative care encounter; Palliative medicine team will continue to support patient, patient's family, and medical team. Visit consisted of counseling and education dealing with the complex and emotionally intense issues of symptom management and palliative care in the setting of serious and potentially life-threatening illness  6. f/u 4 weeks for ongoing monitoring decline, appetite, pain  I spent 90 minutes providing this consultation,  from 12:3-pm to 2:00pm. More than 50% of the time in this consultation was spent coordinating communication.   HISTORY OF PRESENT ILLNESS:  Carl [name] is a 84 y.o. year old male with multiple medical problems including Prostate cancer, lung nodule, Atrial  fibrillation, history of cardioversion, abdominal aortic aneurysm, pulmonary hypertension, myocardial infarction, coronary artery disease s/p cabg, moderate mitral regurgitation, ischemic cardiomyopathy, intracranial hematoma, hypertension, hyperlipidemia, gerd, chronic kidney disease, anemia, bladder calculus, renal calculus, history of: polyps, hip fracture, inguinal hernia repair. In-person visit for palliative care follow-up for Carl [name] and daughter Carl [name]. We talked about purpose of palliative care. Carl [name] in agreement. We talked about prior to Hospice  Carl. [name] was under Palliative care though visits were done telemedicine. We talked about transition to Hospice for which he has receded as he has restarted drug with Urology or early for more aggressive interventions. We talked about medical goals of care and wishes are to continue drug. Carl. [name] endorses he feels like it does help him feel better give him more strength. Carl. [name] endorses he has been sleeping a lot, not a thing. We talked about symptoms of pain which he does experience in his lower back. Carl. [name] endorses he does take Tylenol with some relief. We talked about topical analgesic Voltaren gel. Discussed and will send script in for him to see if it helps also. We talked about pattern of pain. We talked about pain scale. We talked about overall weakness and fatigue. We talked about his appetite which is poor. We talked about the option of trying appetite stimulant Remeron. Carl. [name] was in agreement. Carl. [name] endorses he does nap throughout the day and has more difficulty sleeping at night. We talked about Remeron as antidepressant and addition to being taken at night may help him rest better. Will send in prescription to his Pharmacy.  Discussed will start out at 7.5 mg which is one half of a tablet 15 mg qhs. We talked about nutrition. We talked about the areas that red on his backside. We talked about his son baths  him. We talked about Carl. [name] walking with his walker. No recent falls. We talked about his daily routine. We talked about quality of life. We talked about role of Palliative care and plan of care. Discuss that will follow them for weeks to see if any improvement with appetite with Remeron and sleep pattern in addition to Voltaren gel for lower back pain chronic. Carl [name] in agreement, appointment scheduled. Therapeutic listening and emotional support provided. Contact information. Questions answered to satisfaction. I updated Carl [name] with plan of care and in agreement.  Palliative Care was asked to help to continue to address goals of care.   CODE STATUS: DNR  PPS: 50% HOSPICE ELIGIBILITY/DIAGNOSIS: TBD  PAST MEDICAL HISTORY:  Past Medical History:  Diagnosis Date  . (HFpEF) heart failure with preserved ejection fraction ([place])    a. 08/2016 Echo: EF 50-55%.  [name] AAA (abdominal aortic aneurysm) ([place]) 06/05/2019   abdominal aortic aneurysm measuring 3.1 cm.  . Arthritis    arms  . Bladder calculus   . Chronic anemia   . Chronic anxiety   . CKD (chronic kidney disease), stage III ([place])   . Coronary artery disease CARDIOLOGIST-  DR Rockey Situ   a. CABG 2006 with  LIMA-LAD, SVG-Ramus, SVG-OM, seq SVG-PLV-PDA.  . DOE (dyspnea on exertion)   . Frequent falls 11/2019  . GERD (gastroesophageal reflux disease)   . Heart murmur   . Hematuria   . Hip fracture ([place]) 10/05/2018  . History of bladder stone   . History of colon polyps   . History of non-ST elevation myocardial infarction (NSTEMI) 03/30/2005   post op recovery inguinal hernia repair  . History of pressure ulcer   . History of recurrent UTIs    last admission 08-04-2018 pseudomonas UTI  . Hyperlipidemia   . Hyperplasia of prostate without lower urinary tract symptoms (LUTS)   . Hypertension   . Indwelling Foley catheter present   . Intracranial hematoma ([place]) 10/05/2018  . Ischemic cardiomyopathy    a. echo (01/13) 45-50%  septal akinesis, mild Carl; b. Echo  (01/16) ef 60-65%, mid-apical anteroseptal hypokinesis; c. echo 09-16-2016 ef 50-55%  . Left ureteral stone   . Lung nodule    noted 08/ 2018;  followed by pcp  . LVH (left ventricular hypertrophy) 09/16/2016   Moderate noted on ECHO  . Moderate mitral regurgitation    a.08/2016 Echo: Mod Carl/TR.  [name] Orthostatic hypotension   . PAC (premature atrial contraction)   . Paroxysmal atrial flutter ([place])    a. dx 08/2016, started on amiodarone.  . Persistent atrial fibrillation [facility]) followed by cardiologist-- dr Rockey Situ   a. dx AFib 08/2016 s/p DCCV 10/2016-->eliquis (CHA2DS2VASc = 5).  . Pneumonia   . Pulmonary hypertension ([place])    last echo in epic 09-16-2016  . Recurrent prostate cancer ([place]) UROLOGIST-- DR BELL   s/p  radioactive prostate seed implants in 1997;    due to PSA rising pt has been doing lupron injection's few past several yrs at dr bell office  . Renal calculi    bilateral per CT 06-18-2018  . S/P CABG x 5 04/04/2005   LIMA to LAD,  SVG to Ramus,  SVG to OM,  seqSVG to PLV and PDA  . Wears glasses  SOCIAL HX:  Social History   Tobacco Use  . Smoking status: Former Smoker    Years: 2.00    Types: Cigarettes    Quit date: 07/28/1960    Years since quitting: 59.8  . Smokeless tobacco: Never Used  Substance Use Topics  . Alcohol use: No    ALLERGIES:  Allergies  Allergen Reactions  . Ciprofloxacin Other (See Comments)    PER HEART DR  . Ibuprofen Other (See Comments)    Kidney issues     PERTINENT MEDICATIONS:  Outpatient Encounter Medications as of 05/10/2020  Medication Sig  . acetaminophen (TYLENOL) 500 MG tablet Take 1,000 mg by mouth every 6 (six) hours as needed for mild pain (for pain).   [name] amiodarone (PACERONE) 200 MG tablet Take 1 tablet (200 mg total) by mouth daily.  [name] apixaban (ELIQUIS) 5 MG TABS tablet Take 1 tablet (5 mg total) by mouth 2 (two) times daily.  . ferrous sulfate 325 (65 FE) MG tablet TAKE 1 TABLET  BY MOUTH EVERY DAY WITH BREAKFAST  . Hydrocortisone (GERHARDT'S BUTT CREAM) CREA Apply 1 application topically 4 (four) times daily.  [name] lactose free nutrition (BOOST) LIQD Take 237 mLs by mouth. Drinks on [employer]  . NUBEQA 300 MG tablet Take 600 mg by mouth 2 (two) times daily.  [name] omeprazole (PRILOSEC) 40 MG capsule TAKE 1 CAPSULE BY MOUTH EVERY DAY  . polyethylene glycol (MIRALAX / GLYCOLAX) 17 g packet Take 17 g by mouth daily.  . rosuvastatin (CRESTOR) 40 MG tablet Take 1 tablet (40 mg total) by mouth daily.  . tamsulosin (FLOMAX) 0.4 MG CAPS capsule Take 1 capsule (0.4 mg total) by mouth daily after supper.   No facility-administered encounter medications on file as of 05/10/2020.    PHYSICAL EXAM:   General: NAD, frail appearing, thin, pleasant, debilitated chronically ill, pale male Cardiovascular: regular rate and rhythm Pulmonary: clear ant fields Extremities: muscle wasting, atrophy Neurological:  Generalized weakness; walks with walker Loyal Holzheimer Ihor Gully, NP

## 2020-06-11 ENCOUNTER — Telehealth: Payer: Self-pay | Admitting: Nurse Practitioner

## 2020-06-11 ENCOUNTER — Other Ambulatory Visit: Payer: Medicare Other | Admitting: Nurse Practitioner

## 2020-06-11 ENCOUNTER — Other Ambulatory Visit: Payer: Self-pay

## 2020-06-11 NOTE — Telephone Encounter (Signed)
I called Carl [name], Carl [name] daughter to confirm appointment for f/u [facility] visit today. Carl [name] endorses Carl [name] requests to reschedule, he is not feeling up to a visit today. Rescheduled per [facility] request, contact information given

## 2020-07-05 ENCOUNTER — Other Ambulatory Visit: Payer: Self-pay | Admitting: Family Medicine

## 2020-07-05 DIAGNOSIS — I25810 Atherosclerosis of coronary artery bypass graft(s) without angina pectoris: Secondary | ICD-10-CM

## 2020-07-17 ENCOUNTER — Other Ambulatory Visit: Payer: Self-pay

## 2020-07-17 ENCOUNTER — Other Ambulatory Visit: Payer: Medicare Other | Admitting: Nurse Practitioner

## 2020-08-28 ENCOUNTER — Other Ambulatory Visit: Payer: Medicare Other | Admitting: Nurse Practitioner

## 2020-08-28 ENCOUNTER — Encounter: Payer: Self-pay | Admitting: Nurse Practitioner

## 2020-08-28 ENCOUNTER — Other Ambulatory Visit: Payer: Self-pay

## 2020-08-28 DIAGNOSIS — E43 Unspecified severe protein-calorie malnutrition: Secondary | ICD-10-CM

## 2020-08-28 DIAGNOSIS — Z515 Encounter for palliative care: Secondary | ICD-10-CM | POA: Diagnosis not present

## 2020-08-28 NOTE — Progress Notes (Signed)
Therapist, nutritional Palliative Care Consult Note Telephone: [phone]  Fax: [phone]  PATIENT NAME: Carl [name] DOB: [birth year] MRN: [id number]  PRIMARY CARE PROVIDER:   Glori Luis, MD  REFERRING PROVIDER:  Glori Luis, MD [address] [place],  [place] [address] RESPONSIBLE PARTY:   Self; Benjaman Lobe [phone]  1.Advance Care Planning; DNR;  Will call Dr Sloan Leiter office to see if Home Health referral for wound care, labs to be drawn  2. Goals of Care: Goals include to maximize quality of life and symptom management. Our advance care planning conversation included a discussion about:   The value and importance of advance care planning  Exploration of personal, cultural or spiritual beliefs that might influence medical decisions  Exploration of goals of care in the event of a sudden injury or illness  Identification and preparation of a healthcare agent  Review and updating or creation of anadvance directive document.  3. Anorexia; will monitor appetite, improving, continue supplements  4.Palliative care encounter; Palliative care encounter; Palliative medicine team will continue to support patient, patient's family, and medical team. Visit consisted of counseling and education dealing with the complex and emotionally intense issues of symptom management and palliative care in the setting of serious and potentially life-threatening illness  I spent 60 minutes providing this consultation,  from 12:30pm to 1:30pm. More than 50% of the time in this consultation was spent coordinating communication.   HISTORY OF PRESENT ILLNESS:  Carl [name] is a 85 y.o. year old male with multiple medical problems including Prostate cancer, lung nodule, Atrial fibrillation, history of cardioversion, abdominal aortic aneurysm, pulmonary hypertension, myocardial infarction, coronary artery disease s/p cabg, moderate  mitral regurgitation, ischemic cardiomyopathy, intracranial hematoma, hypertension, hyperlipidemia, gerd, chronic kidney disease, anemia, bladder calculus, renal calculus, history of: polyps, hip fracture, inguinal hernia repair. Carl [name], Carl [name]'s daughter called with new updated phone number. Palliative care follow-up visit with Carl [name] and covid screen negative. I visited Carl [name], a net present. We talked about how he was feeling. Carl [name] endorses he is tired. He is sleeping a lot. We talked about symptoms of pain which he does experience in his lower extremities intermittently. We talked about shortness of breath which he denies. We talked about edema lower extremities which is mild. Carl [name] endorses he does have some areas where he scratched on his lower legs that have been sore. Carl [name] endorses he does continue to have sacral wound decline.  Carl [name] endorses her son does care for his wound putting salve on it. Currently Carl [name] does not receive Home Health Services. We talked about last appointment with urologist epsa is coming down with per chemotherapy oral treatment. We talked about the last time Carl [name] is had labs drawn which has been some time. We talked about the challenges getting Carl. [name] endorses he has more recliner existence. Carl. [name] does sleep in the recliner as well as stay in the recliner throughout the day. Carl. [name] is able to take steps with a walker when needed. We talked about 10 lb weight gain over the last couple weeks. Appetite has been fair as he eats 50% or more at meals. We talked about weight gain with edema versus nutrition. Discussed that Carl. [name] probably does need labs drawn. Carl. [name] was in agreement to have Home Health restart for wound care to a sacral wound and while they out can draw labs. We reviewed medical goals of care. Which  is continued oral chemotherapy an aggressive interventions for the cancer. We talked about  Hospice as he previously was under Hospice Services so Carl. [name] endorses he is not ready to restart Hospice and stop oral chemotherapy. Carl. [name] endorses even though he is over all tired and has more bad days than good he still wants to continue aggressive treatment. We talked about quality of life. We talked about role of Palliative care and plan of care. Discuss with Carl [name] Carl. [name] can follow closely what is next visit in 4 weeks, appointment scheduled. Carl. [name] and Carl [name] in agreement. Carl [name] and I talked separately from Carl. [name]. Carl [name] endorses confusion is intermittent but does seem to be more frequent. Carl [name] endorses he does continue to sleep most of the time. We talked about realistic expectations with a diagnosis of cancer, progression even though pssa is trending down, disability and chronic illness. We talked about caregiver stress fatigue and coping. Therapeutic listening, emotional support provided. Contact information. Questions answered is satisfaction.  Palliative Care was asked to continue to help address goals of care.   CODE STATUS: DNR  PPS: 40% HOSPICE ELIGIBILITY/DIAGNOSIS: TBD  PAST MEDICAL HISTORY:  Past Medical History:  Diagnosis Date  . (HFpEF) heart failure with preserved ejection fraction ([place])    a. 08/2016 Echo: EF 50-55%.  [name] AAA (abdominal aortic aneurysm) ([place]) 06/05/2019   abdominal aortic aneurysm measuring 3.1 cm.  . Arthritis    arms  . Bladder calculus   . Chronic anemia   . Chronic anxiety   . CKD (chronic kidney disease), stage III ([place])   . Coronary artery disease CARDIOLOGIST-  DR Rockey Situ   a. CABG 2006 with  LIMA-LAD, SVG-Ramus, SVG-OM, seq SVG-PLV-PDA.  . DOE (dyspnea on exertion)   . Frequent falls 11/2019  . GERD (gastroesophageal reflux disease)   . Heart murmur   . Hematuria   . Hip fracture ([place]) 10/05/2018  . History of bladder stone   . History of colon polyps   . History of non-ST elevation myocardial  infarction (NSTEMI) 03/30/2005   post op recovery inguinal hernia repair  . History of pressure ulcer   . History of recurrent UTIs    last admission 08-04-2018 pseudomonas UTI  . Hyperlipidemia   . Hyperplasia of prostate without lower urinary tract symptoms (LUTS)   . Hypertension   . Indwelling Foley catheter present   . Intracranial hematoma ([place]) 10/05/2018  . Ischemic cardiomyopathy    a. echo (01/13) 45-50% septal akinesis, mild Carl; b. Echo  (01/16) ef 60-65%, mid-apical anteroseptal hypokinesis; c. echo 09-16-2016 ef 50-55%  . Left ureteral stone   . Lung nodule    noted 08/ 2018;  followed by pcp  . LVH (left ventricular hypertrophy) 09/16/2016   Moderate noted on ECHO  . Moderate mitral regurgitation    a.08/2016 Echo: Mod Carl/TR.  [name] Orthostatic hypotension   . PAC (premature atrial contraction)   . Paroxysmal atrial flutter ([place])    a. dx 08/2016, started on amiodarone.  . Persistent atrial fibrillation [facility]) followed by cardiologist-- dr Rockey Situ   a. dx AFib 08/2016 s/p DCCV 10/2016-->eliquis (CHA2DS2VASc = 5).  . Pneumonia   . Pulmonary hypertension ([place])    last echo in epic 09-16-2016  . Recurrent prostate cancer ([place]) UROLOGIST-- DR BELL   s/p  radioactive prostate seed implants in 1997;    due to PSA rising pt has been doing lupron injection's few past several yrs at dr bell office  . Renal calculi  bilateral per CT 06-18-2018  . S/P CABG x 5 04/04/2005   LIMA to LAD,  SVG to Ramus,  SVG to OM,  seqSVG to PLV and PDA  . Wears glasses     SOCIAL HX:  Social History   Tobacco Use  . Smoking status: Former Smoker    Years: 2.00    Types: Cigarettes    Quit date: 07/28/1960    Years since quitting: 60.1  . Smokeless tobacco: Never Used  Substance Use Topics  . Alcohol use: No    ALLERGIES:  Allergies  Allergen Reactions  . Ciprofloxacin Other (See Comments)    PER HEART DR  . Ibuprofen Other (See Comments)    Kidney issues     PERTINENT MEDICATIONS:   Outpatient Encounter Medications as of 08/28/2020  Medication Sig  . acetaminophen (TYLENOL) 500 MG tablet Take 1,000 mg by mouth every 6 (six) hours as needed for mild pain (for pain).   [name] amiodarone (PACERONE) 200 MG tablet Take 1 tablet (200 mg total) by mouth daily.  [name] apixaban (ELIQUIS) 5 MG TABS tablet Take 1 tablet (5 mg total) by mouth 2 (two) times daily.  . ferrous sulfate 325 (65 FE) MG tablet TAKE 1 TABLET BY MOUTH EVERY DAY WITH BREAKFAST  . Hydrocortisone (GERHARDT'S BUTT CREAM) CREA Apply 1 application topically 4 (four) times daily.  [name] lactose free nutrition (BOOST) LIQD Take 237 mLs by mouth. Drinks on [employer]  . NUBEQA 300 MG tablet Take 600 mg by mouth 2 (two) times daily.  [name] omeprazole (PRILOSEC) 40 MG capsule TAKE 1 CAPSULE BY MOUTH EVERY DAY  . polyethylene glycol (MIRALAX / GLYCOLAX) 17 g packet Take 17 g by mouth daily.  . rosuvastatin (CRESTOR) 40 MG tablet TAKE 1 TABLET BY MOUTH EVERY DAY  . tamsulosin (FLOMAX) 0.4 MG CAPS capsule Take 1 capsule (0.4 mg total) by mouth daily after supper.   No facility-administered encounter medications on file as of 08/28/2020.    PHYSICAL EXAM:   General: NAD, frail appearing, thin pleasant male, chronically ill Cardiovascular: regular rate and rhythm Pulmonary: clear ant fields; decrease bases Extremities: no edema, no joint deformities; muscle wasting Neurological: generalized weakness  Kaylani Fromme Ihor Gully, NP

## 2020-08-29 ENCOUNTER — Telehealth: Payer: Self-pay | Admitting: Family Medicine

## 2020-08-29 NOTE — Telephone Encounter (Signed)
Noted. Message sent to palliative care to see if they can place a home health referral.

## 2020-08-29 NOTE — Telephone Encounter (Signed)
Good afternoon!  It would be wise if [facility] places the referral because they have already seen pt and he's in their care. Now if she wants you to place the referral then pt would need either need to come in or do a virtual face to face appt.

## 2020-08-29 NOTE — Telephone Encounter (Signed)
I received the message below from palliative care. I was going to place the home health referral though I have not seen him in the past 90 days. Can I still place this referral? Or should it come from the palliative care provider?

## 2020-08-29 NOTE — Telephone Encounter (Signed)
-----   Message from [place], NP sent at 08/28/2020  5:18 PM EST ----- Dr [name], Would you please order Home health for wound care for sacral wound and have them draw cmet and cbc while they are there please please...Marland KitchenMarland Kitchen

## 2020-08-30 NOTE — Telephone Encounter (Signed)
Palliative care will try to get [facility] ordered for the patient.

## 2020-09-02 ENCOUNTER — Emergency Department: Payer: Medicare Other

## 2020-09-02 ENCOUNTER — Inpatient Hospital Stay
Admission: EM | Admit: 2020-09-02 | Discharge: 2020-09-07 | DRG: 521 | Disposition: A | Discharge status: Skilled Nursing Facility | Payer: Medicare Other | Attending: Internal Medicine | Admitting: Internal Medicine

## 2020-09-02 ENCOUNTER — Other Ambulatory Visit: Payer: Self-pay

## 2020-09-02 DIAGNOSIS — S72002A Fracture of unspecified part of neck of left femur, initial encounter for closed fracture: Secondary | ICD-10-CM | POA: Diagnosis not present

## 2020-09-02 DIAGNOSIS — Z8546 Personal history of malignant neoplasm of prostate: Secondary | ICD-10-CM | POA: Diagnosis not present

## 2020-09-02 DIAGNOSIS — I2510 Atherosclerotic heart disease of native coronary artery without angina pectoris: Secondary | ICD-10-CM | POA: Diagnosis not present

## 2020-09-02 DIAGNOSIS — S61412A Laceration without foreign body of left hand, initial encounter: Secondary | ICD-10-CM | POA: Diagnosis present

## 2020-09-02 DIAGNOSIS — Z23 Encounter for immunization: Secondary | ICD-10-CM | POA: Diagnosis not present

## 2020-09-02 DIAGNOSIS — C61 Malignant neoplasm of prostate: Secondary | ICD-10-CM | POA: Diagnosis not present

## 2020-09-02 DIAGNOSIS — Z7901 Long term (current) use of anticoagulants: Secondary | ICD-10-CM | POA: Diagnosis not present

## 2020-09-02 DIAGNOSIS — I480 Paroxysmal atrial fibrillation: Secondary | ICD-10-CM | POA: Diagnosis present

## 2020-09-02 DIAGNOSIS — R2689 Other abnormalities of gait and mobility: Secondary | ICD-10-CM | POA: Diagnosis not present

## 2020-09-02 DIAGNOSIS — Z87891 Personal history of nicotine dependence: Secondary | ICD-10-CM

## 2020-09-02 DIAGNOSIS — D649 Anemia, unspecified: Secondary | ICD-10-CM | POA: Diagnosis not present

## 2020-09-02 DIAGNOSIS — N400 Enlarged prostate without lower urinary tract symptoms: Secondary | ICD-10-CM | POA: Diagnosis present

## 2020-09-02 DIAGNOSIS — R443 Hallucinations, unspecified: Secondary | ICD-10-CM | POA: Diagnosis not present

## 2020-09-02 DIAGNOSIS — Z66 Do not resuscitate: Secondary | ICD-10-CM | POA: Diagnosis present

## 2020-09-02 DIAGNOSIS — R7401 Elevation of levels of liver transaminase levels: Secondary | ICD-10-CM | POA: Diagnosis present

## 2020-09-02 DIAGNOSIS — S72092A Other fracture of head and neck of left femur, initial encounter for closed fracture: Principal | ICD-10-CM | POA: Diagnosis present

## 2020-09-02 DIAGNOSIS — I499 Cardiac arrhythmia, unspecified: Secondary | ICD-10-CM | POA: Diagnosis not present

## 2020-09-02 DIAGNOSIS — Z9181 History of falling: Secondary | ICD-10-CM | POA: Diagnosis not present

## 2020-09-02 DIAGNOSIS — Y92009 Unspecified place in unspecified non-institutional (private) residence as the place of occurrence of the external cause: Secondary | ICD-10-CM

## 2020-09-02 DIAGNOSIS — S72042A Displaced fracture of base of neck of left femur, initial encounter for closed fracture: Secondary | ICD-10-CM | POA: Diagnosis not present

## 2020-09-02 DIAGNOSIS — Z96649 Presence of unspecified artificial hip joint: Secondary | ICD-10-CM

## 2020-09-02 DIAGNOSIS — Z471 Aftercare following joint replacement surgery: Secondary | ICD-10-CM | POA: Diagnosis not present

## 2020-09-02 DIAGNOSIS — N183 Chronic kidney disease, stage 3 unspecified: Secondary | ICD-10-CM | POA: Diagnosis not present

## 2020-09-02 DIAGNOSIS — E43 Unspecified severe protein-calorie malnutrition: Secondary | ICD-10-CM | POA: Diagnosis present

## 2020-09-02 DIAGNOSIS — S7292XD Unspecified fracture of left femur, subsequent encounter for closed fracture with routine healing: Secondary | ICD-10-CM | POA: Diagnosis not present

## 2020-09-02 DIAGNOSIS — D62 Acute posthemorrhagic anemia: Secondary | ICD-10-CM | POA: Diagnosis not present

## 2020-09-02 DIAGNOSIS — I4811 Longstanding persistent atrial fibrillation: Secondary | ICD-10-CM | POA: Diagnosis not present

## 2020-09-02 DIAGNOSIS — Z951 Presence of aortocoronary bypass graft: Secondary | ICD-10-CM

## 2020-09-02 DIAGNOSIS — D72829 Elevated white blood cell count, unspecified: Secondary | ICD-10-CM | POA: Diagnosis not present

## 2020-09-02 DIAGNOSIS — W010XXA Fall on same level from slipping, tripping and stumbling without subsequent striking against object, initial encounter: Secondary | ICD-10-CM | POA: Diagnosis present

## 2020-09-02 DIAGNOSIS — D500 Iron deficiency anemia secondary to blood loss (chronic): Secondary | ICD-10-CM | POA: Diagnosis not present

## 2020-09-02 DIAGNOSIS — Z043 Encounter for examination and observation following other accident: Secondary | ICD-10-CM | POA: Diagnosis not present

## 2020-09-02 DIAGNOSIS — Z79899 Other long term (current) drug therapy: Secondary | ICD-10-CM | POA: Diagnosis not present

## 2020-09-02 DIAGNOSIS — W19XXXA Unspecified fall, initial encounter: Secondary | ICD-10-CM | POA: Diagnosis not present

## 2020-09-02 DIAGNOSIS — K219 Gastro-esophageal reflux disease without esophagitis: Secondary | ICD-10-CM | POA: Diagnosis present

## 2020-09-02 DIAGNOSIS — E785 Hyperlipidemia, unspecified: Secondary | ICD-10-CM | POA: Diagnosis not present

## 2020-09-02 DIAGNOSIS — Z6823 Body mass index (BMI) 23.0-23.9, adult: Secondary | ICD-10-CM

## 2020-09-02 DIAGNOSIS — Z20822 Contact with and (suspected) exposure to covid-19: Secondary | ICD-10-CM | POA: Diagnosis present

## 2020-09-02 DIAGNOSIS — S0990XA Unspecified injury of head, initial encounter: Secondary | ICD-10-CM | POA: Diagnosis not present

## 2020-09-02 DIAGNOSIS — I9581 Postprocedural hypotension: Secondary | ICD-10-CM | POA: Diagnosis not present

## 2020-09-02 DIAGNOSIS — I255 Ischemic cardiomyopathy: Secondary | ICD-10-CM | POA: Diagnosis present

## 2020-09-02 DIAGNOSIS — Z743 Need for continuous supervision: Secondary | ICD-10-CM | POA: Diagnosis not present

## 2020-09-02 DIAGNOSIS — D6959 Other secondary thrombocytopenia: Secondary | ICD-10-CM | POA: Diagnosis not present

## 2020-09-02 DIAGNOSIS — S72032A Displaced midcervical fracture of left femur, initial encounter for closed fracture: Secondary | ICD-10-CM | POA: Diagnosis not present

## 2020-09-02 DIAGNOSIS — I10 Essential (primary) hypertension: Secondary | ICD-10-CM | POA: Diagnosis present

## 2020-09-02 DIAGNOSIS — R1319 Other dysphagia: Secondary | ICD-10-CM | POA: Diagnosis present

## 2020-09-02 DIAGNOSIS — M6281 Muscle weakness (generalized): Secondary | ICD-10-CM | POA: Diagnosis not present

## 2020-09-02 DIAGNOSIS — I252 Old myocardial infarction: Secondary | ICD-10-CM

## 2020-09-02 DIAGNOSIS — R1312 Dysphagia, oropharyngeal phase: Secondary | ICD-10-CM | POA: Diagnosis not present

## 2020-09-02 DIAGNOSIS — Z96641 Presence of right artificial hip joint: Secondary | ICD-10-CM | POA: Diagnosis present

## 2020-09-02 DIAGNOSIS — I272 Pulmonary hypertension, unspecified: Secondary | ICD-10-CM | POA: Diagnosis present

## 2020-09-02 DIAGNOSIS — R279 Unspecified lack of coordination: Secondary | ICD-10-CM | POA: Diagnosis not present

## 2020-09-02 DIAGNOSIS — F05 Delirium due to known physiological condition: Secondary | ICD-10-CM | POA: Diagnosis not present

## 2020-09-02 DIAGNOSIS — D509 Iron deficiency anemia, unspecified: Secondary | ICD-10-CM | POA: Diagnosis not present

## 2020-09-02 DIAGNOSIS — Z9889 Other specified postprocedural states: Secondary | ICD-10-CM | POA: Diagnosis not present

## 2020-09-02 DIAGNOSIS — R5381 Other malaise: Secondary | ICD-10-CM | POA: Diagnosis not present

## 2020-09-02 DIAGNOSIS — I130 Hypertensive heart and chronic kidney disease with heart failure and stage 1 through stage 4 chronic kidney disease, or unspecified chronic kidney disease: Secondary | ICD-10-CM | POA: Diagnosis not present

## 2020-09-02 DIAGNOSIS — K5900 Constipation, unspecified: Secondary | ICD-10-CM | POA: Diagnosis not present

## 2020-09-02 DIAGNOSIS — I5030 Unspecified diastolic (congestive) heart failure: Secondary | ICD-10-CM | POA: Diagnosis not present

## 2020-09-02 DIAGNOSIS — I5032 Chronic diastolic (congestive) heart failure: Secondary | ICD-10-CM | POA: Diagnosis present

## 2020-09-02 DIAGNOSIS — Z96642 Presence of left artificial hip joint: Secondary | ICD-10-CM | POA: Diagnosis not present

## 2020-09-02 DIAGNOSIS — R410 Disorientation, unspecified: Secondary | ICD-10-CM | POA: Diagnosis not present

## 2020-09-02 LAB — PROTIME-INR
INR: 1.4 — ABNORMAL HIGH (ref 0.8–1.2)
INR: 1.4 — ABNORMAL HIGH (ref 0.8–1.2)
Prothrombin Time: 16.6 seconds — ABNORMAL HIGH (ref 11.4–15.2)
Prothrombin Time: 16.7 seconds — ABNORMAL HIGH (ref 11.4–15.2)

## 2020-09-02 LAB — CK: Total CK: 400 U/L — ABNORMAL HIGH (ref 49–397)

## 2020-09-02 LAB — URINALYSIS, ROUTINE W REFLEX MICROSCOPIC
Bacteria, UA: NONE SEEN
Bilirubin Urine: NEGATIVE
Glucose, UA: NEGATIVE mg/dL
Ketones, ur: NEGATIVE mg/dL
Nitrite: POSITIVE — AB
Protein, ur: 30 mg/dL — AB
Specific Gravity, Urine: 1.018 (ref 1.005–1.030)
Squamous Epithelial / HPF: NONE SEEN (ref 0–5)
pH: 6 (ref 5.0–8.0)

## 2020-09-02 LAB — COMPREHENSIVE METABOLIC PANEL
ALT: 141 U/L — ABNORMAL HIGH (ref 0–44)
AST: 135 U/L — ABNORMAL HIGH (ref 15–41)
Albumin: 3.1 g/dL — ABNORMAL LOW (ref 3.5–5.0)
Alkaline Phosphatase: 80 U/L (ref 38–126)
Anion gap: 8 (ref 5–15)
BUN: 25 mg/dL — ABNORMAL HIGH (ref 8–23)
CO2: 21 mmol/L — ABNORMAL LOW (ref 22–32)
Calcium: 8.4 mg/dL — ABNORMAL LOW (ref 8.9–10.3)
Chloride: 107 mmol/L (ref 98–111)
Creatinine, Ser: 1.48 mg/dL — ABNORMAL HIGH (ref 0.61–1.24)
GFR, Estimated: 47 mL/min — ABNORMAL LOW (ref >60)
Glucose, Bld: 118 mg/dL — ABNORMAL HIGH (ref 70–99)
Potassium: 5 mmol/L (ref 3.5–5.1)
Sodium: 136 mmol/L (ref 135–145)
Total Bilirubin: 0.7 mg/dL (ref 0.3–1.2)
Total Protein: 5.7 g/dL — ABNORMAL LOW (ref 6.5–8.1)

## 2020-09-02 LAB — CBC WITH DIFFERENTIAL/PLATELET
Abs Immature Granulocytes: 0.06 10*3/uL (ref 0.00–0.07)
Basophils Absolute: 0 10*3/uL (ref 0.0–0.1)
Basophils Relative: 0 %
Eosinophils Absolute: 0 10*3/uL (ref 0.0–0.5)
Eosinophils Relative: 0 %
HCT: 29.8 % — ABNORMAL LOW (ref 39.0–52.0)
Hemoglobin: 9.3 g/dL — ABNORMAL LOW (ref 13.0–17.0)
Immature Granulocytes: 1 %
Lymphocytes Relative: 5 %
Lymphs Abs: 0.5 10*3/uL — ABNORMAL LOW (ref 0.7–4.0)
MCH: 32.6 pg (ref 26.0–34.0)
MCHC: 31.2 g/dL (ref 30.0–36.0)
MCV: 104.6 fL — ABNORMAL HIGH (ref 80.0–100.0)
Monocytes Absolute: 0.4 10*3/uL (ref 0.1–1.0)
Monocytes Relative: 5 %
Neutro Abs: 8.2 10*3/uL — ABNORMAL HIGH (ref 1.7–7.7)
Neutrophils Relative %: 89 %
Platelets: 129 10*3/uL — ABNORMAL LOW (ref 150–400)
RBC: 2.85 MIL/uL — ABNORMAL LOW (ref 4.22–5.81)
RDW: 14.8 % (ref 11.5–15.5)
WBC: 9.2 10*3/uL (ref 4.0–10.5)
nRBC: 0 % (ref 0.0–0.2)

## 2020-09-02 LAB — CBC
HCT: 27.4 % — ABNORMAL LOW (ref 39.0–52.0)
Hemoglobin: 8.7 g/dL — ABNORMAL LOW (ref 13.0–17.0)
MCH: 33.3 pg (ref 26.0–34.0)
MCHC: 31.8 g/dL (ref 30.0–36.0)
MCV: 105 fL — ABNORMAL HIGH (ref 80.0–100.0)
Platelets: 127 10*3/uL — ABNORMAL LOW (ref 150–400)
RBC: 2.61 MIL/uL — ABNORMAL LOW (ref 4.22–5.81)
RDW: 15 % (ref 11.5–15.5)
WBC: 10.9 10*3/uL — ABNORMAL HIGH (ref 4.0–10.5)
nRBC: 0 % (ref 0.0–0.2)

## 2020-09-02 LAB — SARS CORONAVIRUS 2 BY RT PCR (HOSPITAL ORDER, PERFORMED IN ~~LOC~~ HOSPITAL LAB): SARS Coronavirus 2: NEGATIVE

## 2020-09-02 LAB — APTT: aPTT: 35 seconds (ref 24–36)

## 2020-09-02 MED ORDER — SODIUM CHLORIDE 0.9 % IV SOLN: INTRAVENOUS | Status: DC

## 2020-09-02 MED ORDER — MORPHINE SULFATE (PF) 2 MG/ML IV SOLN
0.5000 mg | INTRAVENOUS | Status: DC | PRN
  Administered 2020-09-02 – 2020-09-03 (×5): 0.5 mg via INTRAVENOUS
  Filled 2020-09-02 (×5): qty 1

## 2020-09-02 MED ORDER — METHOCARBAMOL 500 MG PO TABS
500.0000 mg | ORAL_TABLET | Freq: Four times a day (QID) | ORAL | Status: DC | PRN
  Administered 2020-09-05: 500 mg via ORAL
  Filled 2020-09-02 (×3): qty 1

## 2020-09-02 MED ORDER — MORPHINE SULFATE (PF) 4 MG/ML IV SOLN
4.0000 mg | Freq: Once | INTRAVENOUS | Status: AC
  Administered 2020-09-02: 4 mg via INTRAVENOUS
  Filled 2020-09-02: qty 1

## 2020-09-02 MED ORDER — ONDANSETRON HCL 4 MG/2ML IJ SOLN
4.0000 mg | Freq: Once | INTRAMUSCULAR | Status: AC
  Administered 2020-09-02: 4 mg via INTRAVENOUS
  Filled 2020-09-02: qty 2

## 2020-09-02 MED ORDER — CLINDAMYCIN PHOSPHATE 600 MG/50ML IV SOLN
600.0000 mg | INTRAVENOUS | Status: AC
  Filled 2020-09-02 (×2): qty 50

## 2020-09-02 MED ORDER — MORPHINE SULFATE (PF) 2 MG/ML IV SOLN: 1.0000 mg | INTRAVENOUS | Status: DC | PRN

## 2020-09-02 MED ORDER — TAMSULOSIN HCL 0.4 MG PO CAPS
0.4000 mg | ORAL_CAPSULE | Freq: Every day | ORAL | Status: DC
  Administered 2020-09-02 – 2020-09-06 (×5): 0.4 mg via ORAL
  Filled 2020-09-02 (×5): qty 1

## 2020-09-02 MED ORDER — CEFAZOLIN SODIUM-DEXTROSE 2-4 GM/100ML-% IV SOLN
2.0000 g | INTRAVENOUS | Status: AC
  Filled 2020-09-02: qty 100

## 2020-09-02 MED ORDER — POLYETHYLENE GLYCOL 3350 17 G PO PACK
17.0000 g | PACK | Freq: Every day | ORAL | Status: DC
  Administered 2020-09-02 – 2020-09-07 (×5): 17 g via ORAL
  Filled 2020-09-02 (×5): qty 1

## 2020-09-02 MED ORDER — METHOCARBAMOL 1000 MG/10ML IJ SOLN
500.0000 mg | Freq: Four times a day (QID) | INTRAVENOUS | Status: DC | PRN
  Filled 2020-09-02: qty 5

## 2020-09-02 MED ORDER — ROSUVASTATIN CALCIUM 20 MG PO TABS: 40.0000 mg | ORAL_TABLET | Freq: Every day | ORAL | Status: DC

## 2020-09-02 MED ORDER — BOOST PO LIQD
237.0000 mL | ORAL | Status: DC
  Administered 2020-09-05: 237 mL via ORAL
  Filled 2020-09-02: qty 237

## 2020-09-02 MED ORDER — ACETAMINOPHEN 500 MG PO TABS: 1000.0000 mg | ORAL_TABLET | Freq: Four times a day (QID) | ORAL | Status: DC | PRN

## 2020-09-02 MED ORDER — PANTOPRAZOLE SODIUM 40 MG PO TBEC
40.0000 mg | DELAYED_RELEASE_TABLET | Freq: Every day | ORAL | Status: DC
  Administered 2020-09-02 – 2020-09-07 (×5): 40 mg via ORAL
  Filled 2020-09-02 (×5): qty 1

## 2020-09-02 MED ORDER — SENNA 8.6 MG PO TABS
1.0000 | ORAL_TABLET | Freq: Two times a day (BID) | ORAL | Status: DC
  Administered 2020-09-02 – 2020-09-07 (×9): 8.6 mg via ORAL
  Filled 2020-09-02 (×9): qty 1

## 2020-09-02 MED ORDER — CHLORHEXIDINE GLUCONATE 4 % EX LIQD
1.0000 "application " | Freq: Once | CUTANEOUS | Status: AC
  Administered 2020-09-03: 1 via TOPICAL

## 2020-09-02 MED ORDER — HYDROCODONE-ACETAMINOPHEN 5-325 MG PO TABS
1.0000 | ORAL_TABLET | ORAL | Status: DC | PRN
  Administered 2020-09-02 – 2020-09-05 (×7): 1 via ORAL
  Filled 2020-09-02 (×7): qty 1

## 2020-09-02 MED ORDER — MORPHINE SULFATE (PF) 4 MG/ML IV SOLN
4.0000 mg | Freq: Once | INTRAVENOUS | Status: AC
Start: 2020-09-02 — End: 2020-09-02
  Administered 2020-09-02: 4 mg via INTRAVENOUS
  Filled 2020-09-02: qty 1

## 2020-09-02 MED ORDER — AMIODARONE HCL 200 MG PO TABS
200.0000 mg | ORAL_TABLET | Freq: Every day | ORAL | Status: DC
  Administered 2020-09-02 – 2020-09-07 (×5): 200 mg via ORAL
  Filled 2020-09-02 (×5): qty 1

## 2020-09-02 MED ORDER — DAROLUTAMIDE 300 MG PO TABS: 600.0000 mg | ORAL_TABLET | Freq: Two times a day (BID) | ORAL | Status: DC

## 2020-09-02 MED ORDER — FERROUS SULFATE 325 (65 FE) MG PO TABS
325.0000 mg | ORAL_TABLET | Freq: Every day | ORAL | Status: DC
  Administered 2020-09-04 – 2020-09-07 (×4): 325 mg via ORAL
  Filled 2020-09-02 (×5): qty 1

## 2020-09-02 NOTE — ED Notes (Signed)
Dietary called again for meal tray

## 2020-09-02 NOTE — ED Notes (Signed)
Dietary called for meal tray as pt did not get one. Dietary to bring meal tray

## 2020-09-02 NOTE — ED Notes (Signed)
Daughter, [name], wanted number added to chart [phone]

## 2020-09-02 NOTE — ED Notes (Signed)
Secure chat message sent to jillian hinson, rn on pts admitting floor for report.

## 2020-09-02 NOTE — ED Notes (Signed)
Pt presents to ED via EMS with c/o of L hip pain due to a fall. Pt states he tripped and fell, pt denies dizziness or chest pain prior to fall. Pt does have shorting and rotation to the L hip, L pedal pulse intact. Pt also has skin tear to L elbow and L temporal area. Pt denies LOC. Pt also has some sharp wire protruding from xiphoid process area, pt states "this has been like this", but this RN noticed fresh blood around the site. Pt states the wire is from a rib FX's a few years ago. Pt is currently A&Ox4 and states he does live home but his daughter lives with him. Pt presented with 22G in L hand and also was given 145mcg of fentanyl by EMS PTA. Pt states no replacement to L hip but states he has his R hip replaced "in the past". Pt is taking a blood thinner at this time and did hit his head during the fall, pt denies neck or back pain to this RN. NAD noted at this time, VSS.

## 2020-09-02 NOTE — ED Notes (Signed)
Pt with sats of 100% on 1 Lnc, pt taken off O2 [employer] for assessment on room air

## 2020-09-02 NOTE — H&P (Signed)
History and Physical    Carl [name] [id number] DOB: 07-09-1936 DOA: 09/02/2020  PCP: [name], MD   Patient coming from: Home  I have personally briefly reviewed patient's old medical records in [place]  Chief Complaint: Status post fall  HPI: Carl [name] is a 85 y.o. male with medical history significant for paroxysmal atrial fibrillation on anticoagulation therapy with Eliquis, chronic diastolic dysfunction CHF, abdominal aortic aneurysm, stage III chronic kidney disease and coronary artery disease who presents to the ER via EMS for evaluation following a fall at home.  Patient states that he tripped and fell landing on his left side.  He denies having any dizziness or lightheadedness prior to the fall and denies any loss of consciousness.  He hit his head and upon presentation to the ER complained of neck pain and pain in his left hip.  He rates his left hip pain a 10 x 10 in intensity at its worst, nonradiating and worse with any form of movement.  Patient was unable to bear weight on the left leg due to pain. He denies having any chest pain, no nausea, no vomiting, no diarrhea, no abdominal pain, no fever, no chills, no cough, no blurred vision, no urinary frequency, no nocturia, no dysuria, no palpitations, no diaphoresis. Labs show sodium 136, potassium 5.0, chloride 107, bicarb 21, glucose 118, BUN 25, creatinine 1.48, calcium 8.4, alkaline phosphatase , albumin 3.1, AST 135, ALT 141, total protein 5.7, total CK 400, white count 9.2, hemoglobin 9.3, hematocrit 29.8, MCV 104.6, RDW 14.8, platelet count 129, PT 16.6, INR 1.4 Patient SARS coronavirus 2 PCR test is negative CT scan of the head/cervical spine CT shows stable small left frontal meningioma.  Mild diffuse cortical atrophy.  Frontal and left ethmoid sinusitis.  Multilevel degenerative disc disease.  No acute abnormality seen in the cervical spine. Chest x-ray reviewed by me shows no evidence of  acute cardiopulmonary disease Left hip x-ray shows moderately displaced proximal left femoral neck fracture.  Status post right total hip arthroplasty. Twelve-lead EKG shows sinus rhythm with no acute ST-T wave changes.    ED Course: Patient is an 85 year old Caucasian male who presents to the emergency room for evaluation following a fall at home.  He complains of pain in his left hip and is noted to have a moderately displaced proximal left femoral neck fracture.  Patient is on Eliquis as primary prophylaxis for an acute stroke and his last dose was on 09/01/20.  Orthopedic surgery was consulted in the ER and surgical repair is planned for 09/03/20.  Review of Systems: As per HPI otherwise all systems reviewed and negative.    Past Medical History:  Diagnosis Date  . (HFpEF) heart failure with preserved ejection fraction ([place])    a. 08/2016 Echo: EF 50-55%.  [name] AAA (abdominal aortic aneurysm) ([place]) 06/05/2019   abdominal aortic aneurysm measuring 3.1 cm.  . Arthritis    arms  . Bladder calculus   . Chronic anemia   . Chronic anxiety   . CKD (chronic kidney disease), stage III ([place])   . Coronary artery disease CARDIOLOGIST-  DR Rockey Situ   a. CABG 2006 with  LIMA-LAD, SVG-Ramus, SVG-OM, seq SVG-PLV-PDA.  . DOE (dyspnea on exertion)   . Frequent falls 11/2019  . GERD (gastroesophageal reflux disease)   . Heart murmur   . Hematuria   . Hip fracture ([place]) 10/05/2018  . History of bladder stone   . History of colon polyps   .  History of non-ST elevation myocardial infarction (NSTEMI) 03/30/2005   post op recovery inguinal hernia repair  . History of pressure ulcer   . History of recurrent UTIs    last admission 08-04-2018 pseudomonas UTI  . Hyperlipidemia   . Hyperplasia of prostate without lower urinary tract symptoms (LUTS)   . Hypertension   . Indwelling Foley catheter present   . Intracranial hematoma ([place]) 10/05/2018  . Ischemic cardiomyopathy    a. echo (01/13) 45-50% septal  akinesis, mild MR; b. Echo  (01/16) ef 60-65%, mid-apical anteroseptal hypokinesis; c. echo 09-16-2016 ef 50-55%  . Left ureteral stone   . Lung nodule    noted 08/ 2018;  followed by pcp  . LVH (left ventricular hypertrophy) 09/16/2016   Moderate noted on ECHO  . Moderate mitral regurgitation    a.08/2016 Echo: Mod MR/TR.  [name] Orthostatic hypotension   . PAC (premature atrial contraction)   . Paroxysmal atrial flutter ([place])    a. dx 08/2016, started on amiodarone.  . Persistent atrial fibrillation [facility]) followed by cardiologist-- dr Rockey Situ   a. dx AFib 08/2016 s/p DCCV 10/2016-->eliquis (CHA2DS2VASc = 5).  . Pneumonia   . Pulmonary hypertension ([place])    last echo in epic 09-16-2016  . Recurrent prostate cancer ([place]) UROLOGIST-- DR BELL   s/p  radioactive prostate seed implants in 1997;    due to PSA rising pt has been doing lupron injection's few past several yrs at dr bell office  . Renal calculi    bilateral per CT 06-18-2018  . S/P CABG x 5 04/04/2005   LIMA to LAD,  SVG to Ramus,  SVG to OM,  seqSVG to PLV and PDA  . Wears glasses     Past Surgical History:  Procedure Laterality Date  . CARDIAC CATHETERIZATION  04/02/2005   dr Doreatha Lew    Critical three-vessel coronary disease --  Essentially normal left ventricular function , ef 55%  . CARDIOVASCULAR STRESS TEST  05-01-2008  dr Doreatha Lew /  dr harding   Low risk nuclear study w/ no ischemia or scar but flattened septal motion/  ef 64%  . CARDIOVERSION N/A 11/19/2016   Procedure: CARDIOVERSION;  Surgeon: Jerline Pain, MD;  Location: [place];  Service: Cardiovascular;  Laterality: N/A;  . COLONOSCOPY N/A 10/23/2016   Procedure: COLONOSCOPY;  Surgeon: Milus Banister, MD;  Location: [facility];  Service: Endoscopy;  Laterality: N/A;  . CORONARY ARTERY BYPASS GRAFT  04/04/2005   dr  Ricard Dillon   LIMA - LAD,  SVG - Ramus, SVG - OM,  seqSVG - PLV and PDA  . CYSTOSCOPY WITH LITHOLAPAXY N/A 05/07/2016   Procedure: CYSTOSCOPY WITH  LITHOLAPAXY;  Surgeon: Rana Snare, MD;  [facility];  Service: Urology;  Laterality: N/A;  . CYSTOSCOPY WITH RETROGRADE PYELOGRAM, URETEROSCOPY AND STENT PLACEMENT  09/03/2018   Procedure: CYSTOSCOPY WITH RETROGRADE PYELOGRAM, URETEROSCOPY LASER LITHOTRIPSY AND STENT PLACEMENT;  Surgeon: Lucas Mallow, MD;  Location: [facility];  Service: Urology;;  . CYSTOSCOPY/URETEROSCOPY/HOLMIUM LASER/STENT PLACEMENT Right 06/15/2019   Procedure: CYSTOSCOPY RIGHT URETEROSCOPY HOLMIUM LASER STENT PLACEMENT;  Surgeon: Lucas Mallow, MD;  Location: [facility];  Service: Urology;  Laterality: Right;  . HOLMIUM LASER APPLICATION N/A [id number]/8315   Procedure: HOLMIUM LASER APPLICATION;  Surgeon: Rana Snare, MD;  Location: [facility];  Service: Urology;  Laterality: N/A;  . INGUINAL HERNIA REPAIR Left 03-30-2005  dr Excell Seltzer   incarcerated   . RADIOACTIVE PROSTATE SEED IMPLANTS  1997  .  TOTAL HIP ARTHROPLASTY Right 10/07/2018   Procedure: TOTAL HIP ARTHROPLASTY ANTERIOR APPROACH;  Surgeon: Rod Can, MD;  Location: [facility];  Service: Orthopedics;  Laterality: Right;  . TRANSTHORACIC ECHOCARDIOGRAM  08/24/2014   mid anteroseptal and distal septak hypokinetic,  mild focal basal LVH, ef 60-65%,  grade 1 diastolic dysfunction/  mild MR/  mild LAE/  mild dilated RV with normal RVSP/  trivial TR     reports that he quit smoking about 60 years ago. His smoking use included cigarettes. He quit after 2.00 years of use. He has never used smokeless tobacco. He reports that he does not drink alcohol and does not use drugs.  Allergies  Allergen Reactions  . Ciprofloxacin Other (See Comments)    PER HEART DR  . Ibuprofen Other (See Comments)    Kidney issues    Family History  Problem Relation Age of Onset  . Heart disease Mother   . Cancer Mother   . Cancer Father   . Cancer Brother      Prior to Admission medications   Medication Sig Start Date End Date Taking?  Authorizing Provider  acetaminophen (TYLENOL) 500 MG tablet Take 1,000 mg by mouth every 6 (six) hours as needed for mild pain (for pain).     [provider]  amiodarone (PACERONE) 200 MG tablet Take 1 tablet (200 mg total) by mouth daily. 02/03/20   Loel Dubonnet, NP  apixaban (ELIQUIS) 5 MG TABS tablet Take 1 tablet (5 mg total) by mouth 2 (two) times daily. 05/03/20   [name], NP  ferrous sulfate 325 (65 FE) MG tablet TAKE 1 TABLET BY MOUTH EVERY DAY WITH BREAKFAST 03/07/20   Crecencio Mc, MD  Hydrocortisone (GERHARDT'S BUTT CREAM) CREA Apply 1 application topically 4 (four) times daily. 12/23/19   [place] Donnamarie Poag, DO  lactose free nutrition (BOOST) LIQD Take 237 mLs by mouth. Drinks on [employer], Historical, MD  NUBEQA 300 MG tablet Take 600 mg by mouth 2 (two) times daily. 02/06/20   [provider]  omeprazole (PRILOSEC) 40 MG capsule TAKE 1 CAPSULE BY MOUTH EVERY DAY 03/08/20   [name], MD  polyethylene glycol (MIRALAX / GLYCOLAX) 17 g packet Take 17 g by mouth daily. 12/24/19   [place] Eric J, DO  rosuvastatin (CRESTOR) 40 MG tablet TAKE 1 TABLET BY MOUTH EVERY DAY 07/05/20   [name], MD  tamsulosin (FLOMAX) 0.4 MG CAPS capsule Take 1 capsule (0.4 mg total) by mouth daily after supper. 01/06/18   [name], MD    Physical Exam: Vitals:   09/02/20 0736 09/02/20 0740 09/02/20 0900 09/02/20 1000  BP: 131/66  124/66 126/84  Pulse: 78  75 86  Resp:   11 14  Temp: 97.7 F (36.5 C)     TempSrc: Oral     SpO2: 99%  98% 96%  Weight:  68 kg    Height:  5\' 8"  (1.727 m)       Vitals:   09/02/20 0736 09/02/20 0740 09/02/20 0900 09/02/20 1000  BP: 131/66  124/66 126/84  Pulse: 78  75 86  Resp:   11 14  Temp: 97.7 F (36.5 C)     TempSrc: Oral     SpO2: 99%  98% 96%  Weight:  68 kg    Height:  5\' 8"  (1.727 m)      Constitutional: NAD, alert and oriented x 3.  Appears comfortable and in no distress.  Hematoma over  left forehead Eyes: PERRL, lids and conjunctivae pallor ENMT: Mucous membranes are moist.  Neck: normal, supple, no masses, no thyromegaly Respiratory: clear to auscultation bilaterally, no wheezing, no crackles. Normal respiratory effort. No accessory muscle use.  Cardiovascular: Regular rate and rhythm, no murmurs / rubs / gallops. No extremity edema. 2+ pedal pulses. No carotid bruits.  Abdomen: no tenderness, no masses palpated. No hepatosplenomegaly. Bowel sounds positive.  Musculoskeletal: no clubbing / cyanosis.  Shortening of left lower extremity with decreased range of motion involving the left hip.  Skin tear over left forearm Skin: no rashes, lesions, ulcers.  Neurologic: No gross focal neurologic deficit.  Able to move all his extremities Psychiatric: Normal mood and affect.   Labs on Admission: I have personally reviewed following labs and imaging studies  CBC: Recent Labs  Lab 09/02/20 0758  WBC 9.2  NEUTROABS 8.2*  HGB 9.3*  HCT 29.8*  MCV 104.6*  PLT 824*   Basic Metabolic Panel: Recent Labs  Lab 09/02/20 0758  NA 136  K 5.0  CL 107  CO2 21*  GLUCOSE 118*  BUN 25*  CREATININE 1.48*  CALCIUM 8.4*   GFR: Estimated Creatinine Clearance: 37 mL/min (A) (by C-G formula based on SCr of 1.48 mg/dL (H)). Liver Function Tests: Recent Labs  Lab 09/02/20 0758  AST 135*  ALT 141*  ALKPHOS 80  BILITOT 0.7  PROT 5.7*  ALBUMIN 3.1*   No results for input(s): LIPASE, AMYLASE in the last 168 hours. No results for input(s): AMMONIA in the last 168 hours. Coagulation Profile: Recent Labs  Lab 09/02/20 0758  INR 1.4*   Cardiac Enzymes: Recent Labs  Lab 09/02/20 0758  CKTOTAL 400*   BNP (last 3 results) No results for input(s): PROBNP in the last 8760 hours. HbA1C: No results for input(s): HGBA1C in the last 72 hours. CBG: No results for input(s): GLUCAP in the last 168 hours. Lipid Profile: No results for input(s): CHOL, HDL, LDLCALC, TRIG, CHOLHDL,  LDLDIRECT in the last 72 hours. Thyroid Function Tests: No results for input(s): TSH, T4TOTAL, FREET4, T3FREE, THYROIDAB in the last 72 hours. Anemia Panel: No results for input(s): VITAMINB12, FOLATE, FERRITIN, TIBC, IRON, RETICCTPCT in the last 72 hours. Urine analysis:    Component Value Date/Time   COLORURINE AMBER (A) 02/07/2020 1710   APPEARANCEUR TURBID (A) 02/07/2020 1710   LABSPEC 1.017 02/07/2020 1710   PHURINE 5.0 02/07/2020 1710   GLUCOSEU NEGATIVE 02/07/2020 1710   HGBUR LARGE (A) 02/07/2020 1710   BILIRUBINUR NEGATIVE 02/07/2020 1710   BILIRUBINUR negative 01/17/2020 1227   KETONESUR NEGATIVE 02/07/2020 1710   PROTEINUR 100 (A) 02/07/2020 1710   UROBILINOGEN 0.2 01/17/2020 1227   NITRITE NEGATIVE 02/07/2020 1710   LEUKOCYTESUR LARGE (A) 02/07/2020 1710    Radiological Exams on Admission: DG Chest 1 View  Result Date: 09/02/2020 CLINICAL DATA:  Status post coronary bypass graft. EXAM: CHEST  1 VIEW COMPARISON:  Dec 19, 2019. FINDINGS: The heart size and mediastinal contours are within normal limits. Status post coronary bypass graft. No pneumothorax or pleural effusion is noted. Both lungs are clear. The visualized skeletal structures are unremarkable. IMPRESSION: No active disease. Electronically Signed   By: Marijo Conception M.D.   On: 09/02/2020 08:53   CT Head Wo Contrast  Result Date: 09/02/2020 CLINICAL DATA:  Head injury after fall. EXAM: CT HEAD WITHOUT CONTRAST CT CERVICAL SPINE WITHOUT CONTRAST TECHNIQUE: Multidetector CT imaging of the head and cervical spine was performed following the standard  protocol without intravenous contrast. Multiplanar CT image reconstructions of the cervical spine were also generated. COMPARISON:  Dec 19, 2019. FINDINGS: CT HEAD FINDINGS Brain: Stable small left frontal meningioma. Ventricular size is within normal limits. Mild diffuse cortical atrophy is noted. There is no evidence of hemorrhage or acute infarction. Vascular: No  hyperdense vessel or unexpected calcification. Skull: Normal. Negative for fracture or focal lesion. Sinuses/Orbits: Frontal and left ethmoid sinusitis is noted. Other: None. CT CERVICAL SPINE FINDINGS Alignment: Normal. Skull base and vertebrae: No acute fracture. No primary bone lesion or focal pathologic process. Soft tissues and spinal canal: No prevertebral fluid or swelling. No visible canal hematoma. Disc levels: Moderate degenerative disc disease is noted at C3-4, C4-5, C5-6 and C6-7. Upper chest: Negative. Other: None. IMPRESSION: 1. Stable small left frontal meningioma. Mild diffuse cortical atrophy. Frontal and left ethmoid sinusitis. 2. Multilevel degenerative disc disease. No acute abnormality seen in the cervical spine. Electronically Signed   By: Marijo Conception M.D.   On: 09/02/2020 08:33   CT Cervical Spine Wo Contrast  Result Date: 09/02/2020 CLINICAL DATA:  Head injury after fall. EXAM: CT HEAD WITHOUT CONTRAST CT CERVICAL SPINE WITHOUT CONTRAST TECHNIQUE: Multidetector CT imaging of the head and cervical spine was performed following the standard protocol without intravenous contrast. Multiplanar CT image reconstructions of the cervical spine were also generated. COMPARISON:  Dec 19, 2019. FINDINGS: CT HEAD FINDINGS Brain: Stable small left frontal meningioma. Ventricular size is within normal limits. Mild diffuse cortical atrophy is noted. There is no evidence of hemorrhage or acute infarction. Vascular: No hyperdense vessel or unexpected calcification. Skull: Normal. Negative for fracture or focal lesion. Sinuses/Orbits: Frontal and left ethmoid sinusitis is noted. Other: None. CT CERVICAL SPINE FINDINGS Alignment: Normal. Skull base and vertebrae: No acute fracture. No primary bone lesion or focal pathologic process. Soft tissues and spinal canal: No prevertebral fluid or swelling. No visible canal hematoma. Disc levels: Moderate degenerative disc disease is noted at C3-4, C4-5, C5-6 and  C6-7. Upper chest: Negative. Other: None. IMPRESSION: 1. Stable small left frontal meningioma. Mild diffuse cortical atrophy. Frontal and left ethmoid sinusitis. 2. Multilevel degenerative disc disease. No acute abnormality seen in the cervical spine. Electronically Signed   By: Marijo Conception M.D.   On: 09/02/2020 08:33   DG Hip Unilat W or Wo Pelvis 2-3 Views Left  Result Date: 09/02/2020 CLINICAL DATA:  Left hip pain after fall. EXAM: DG HIP (WITH OR WITHOUT PELVIS) 2-3V LEFT COMPARISON:  Dec 19, 2019. FINDINGS: Moderately displaced proximal left femoral neck fracture is noted. Status post right total hip arthroplasty. IMPRESSION: Moderately displaced proximal left femoral neck fracture. Electronically Signed   By: Marijo Conception M.D.   On: 09/02/2020 08:52    EKG: Independently reviewed.  Sinus rhythm  Assessment/Plan Principal Problem:   Closed left hip fracture (HCC) Active Problems:   Essential hypertension   Chronic kidney disease (CKD), stage III (moderate) (HCC)   Prostate CA (HCC)   Fall   Transaminitis   AF (paroxysmal atrial fibrillation) (HCC)     Closed left hip fracture Following a mechanical fall at home Patient presents for evaluation of left hip pain and is found to have a moderately displaced proximal left femoral neck fracture. Immobilize left lower extremity Pain control Start patient on muscle relaxants We will request orthopedic surgery consult     History of paroxysmal atrial fibrillation Patient is currently in sinus rhythm and is rate controlled on amiodarone He is on  Eliquis for primary prophylaxis against an acute stroke, will place Eliquis on hold for planned surgical repair of left hip fracture in a.m.    Transaminitis Most likely medication induced as well as from mild rhabdomyolysis Hold statins for now Amiodarone may also account for his transaminitis but will continue for now. Monitor liver enzymes closely    History of prostate  cancer Continue darolutamide    Hypertension with complications of stage III chronic kidney disease Stable Monitor renal function closely during this hospitalization   DVT prophylaxis: SCD Code Status: Full code Family Communication: Greater than 50% of time was spent discussing patient's condition and plan of care with him at the bedside.  All questions and concerns have been addressed.  He verbalizes understanding and agrees with the plan.  CODE STATUS was discussed and he is a DO NOT RESUSCITATE. Disposition Plan: Back to previous home environment Consults called: Orthopedic surgery    Tierre Gerard MD [facility]     09/02/2020, 10:47 AM

## 2020-09-02 NOTE — ED Notes (Signed)
Pt's daughter [name] took pt pillows and phone home with her.

## 2020-09-02 NOTE — Consult Note (Signed)
ORTHOPAEDIC CONSULTATION  REQUESTING PHYSICIAN: Collier Bullock, MD  Chief Complaint: Left hip pain  HPI: Carl [name] is a 85 y.o. male who complains of left hip pain after a fall at home yesterday.  He merely tripped and hit the left side.  His family got him back to bed but the pain was severe and he was brought to the emergency room.  Exam and x-rays reveal a completely displaced subcapital fracture of the left hip.  He is being admitted for medical evaluation and surgical fixation of his fracture.  I have spoken to the patient and his daughter in detail about this and they both wish to proceed with surgery so that he can be ambulatory again.  Risks and benefits and postop protocol were discussed with them.  They are familiar with this since he has had a right total hip replacement in the past.  He will probably need rehab prior to going home again.  He has multiple medical issues and has been on Eliquis for atrial fibrillation.  Past Medical History:  Diagnosis Date  . (HFpEF) heart failure with preserved ejection fraction ([place])    a. 08/2016 Echo: EF 50-55%.  [name] AAA (abdominal aortic aneurysm) ([place]) 06/05/2019   abdominal aortic aneurysm measuring 3.1 cm.  . Arthritis    arms  . Bladder calculus   . Chronic anemia   . Chronic anxiety   . CKD (chronic kidney disease), stage III ([place])   . Coronary artery disease CARDIOLOGIST-  DR Rockey Situ   a. CABG 2006 with  LIMA-LAD, SVG-Ramus, SVG-OM, seq SVG-PLV-PDA.  . DOE (dyspnea on exertion)   . Frequent falls 11/2019  . GERD (gastroesophageal reflux disease)   . Heart murmur   . Hematuria   . Hip fracture ([place]) 10/05/2018  . History of bladder stone   . History of colon polyps   . History of non-ST elevation myocardial infarction (NSTEMI) 03/30/2005   post op recovery inguinal hernia repair  . History of pressure ulcer   . History of recurrent UTIs    last admission 08-04-2018 pseudomonas UTI  . Hyperlipidemia   . Hyperplasia of  prostate without lower urinary tract symptoms (LUTS)   . Hypertension   . Indwelling Foley catheter present   . Intracranial hematoma ([place]) 10/05/2018  . Ischemic cardiomyopathy    a. echo (01/13) 45-50% septal akinesis, mild MR; b. Echo  (01/16) ef 60-65%, mid-apical anteroseptal hypokinesis; c. echo 09-16-2016 ef 50-55%  . Left ureteral stone   . Lung nodule    noted 08/ 2018;  followed by pcp  . LVH (left ventricular hypertrophy) 09/16/2016   Moderate noted on ECHO  . Moderate mitral regurgitation    a.08/2016 Echo: Mod MR/TR.  [name] Orthostatic hypotension   . PAC (premature atrial contraction)   . Paroxysmal atrial flutter ([place])    a. dx 08/2016, started on amiodarone.  . Persistent atrial fibrillation [facility]) followed by cardiologist-- dr Rockey Situ   a. dx AFib 08/2016 s/p DCCV 10/2016-->eliquis (CHA2DS2VASc = 5).  . Pneumonia   . Pulmonary hypertension ([place])    last echo in epic 09-16-2016  . Recurrent prostate cancer ([place]) UROLOGIST-- DR BELL   s/p  radioactive prostate seed implants in 1997;    due to PSA rising pt has been doing lupron injection's few past several yrs at dr bell office  . Renal calculi    bilateral per CT 06-18-2018  . S/P CABG x 5 04/04/2005   LIMA to LAD,  SVG to Ramus,  SVG to OM,  seqSVG to PLV and PDA  . Wears glasses    Past Surgical History:  Procedure Laterality Date  . CARDIAC CATHETERIZATION  04/02/2005   dr Doreatha Lew    Critical three-vessel coronary disease --  Essentially normal left ventricular function , ef 55%  . CARDIOVASCULAR STRESS TEST  05-01-2008  dr Doreatha Lew /  dr harding   Low risk nuclear study w/ no ischemia or scar but flattened septal motion/  ef 64%  . CARDIOVERSION N/A 11/19/2016   Procedure: CARDIOVERSION;  Surgeon: Jerline Pain, MD;  Location: [facility];  Service: Cardiovascular;  Laterality: N/A;  . COLONOSCOPY N/A 10/23/2016   Procedure: COLONOSCOPY;  Surgeon: Milus Banister, MD;  Location: [facility];  Service: Endoscopy;   Laterality: N/A;  . CORONARY ARTERY BYPASS GRAFT  04/04/2005   dr  Ricard Dillon   LIMA - LAD,  SVG - Ramus, SVG - OM,  seqSVG - PLV and PDA  . CYSTOSCOPY WITH LITHOLAPAXY N/A 05/07/2016   Procedure: CYSTOSCOPY WITH LITHOLAPAXY;  Surgeon: Rana Snare, MD;  Location: [facility];  Service: Urology;  Laterality: N/A;  . CYSTOSCOPY WITH RETROGRADE PYELOGRAM, URETEROSCOPY AND STENT PLACEMENT  09/03/2018   Procedure: CYSTOSCOPY WITH RETROGRADE PYELOGRAM, URETEROSCOPY LASER LITHOTRIPSY AND STENT PLACEMENT;  Surgeon: Lucas Mallow, MD;  Location: [facility];  Service: Urology;;  . CYSTOSCOPY/URETEROSCOPY/HOLMIUM LASER/STENT PLACEMENT Right 06/15/2019   Procedure: CYSTOSCOPY RIGHT URETEROSCOPY HOLMIUM LASER STENT PLACEMENT;  Surgeon: Lucas Mallow, MD;  Location: [facility];  Service: Urology;  Laterality: Right;  . HOLMIUM LASER APPLICATION N/A 85/[id number]/2703   Procedure: HOLMIUM LASER APPLICATION;  Surgeon: Rana Snare, MD;  Location: [facility];  Service: Urology;  Laterality: N/A;  . INGUINAL HERNIA REPAIR Left 03-30-2005  dr Excell Seltzer   incarcerated   . RADIOACTIVE PROSTATE SEED IMPLANTS  1997  . TOTAL HIP ARTHROPLASTY Right 10/07/2018   Procedure: TOTAL HIP ARTHROPLASTY ANTERIOR APPROACH;  Surgeon: Rod Can, MD;  Location: [facility];  Service: Orthopedics;  Laterality: Right;  . TRANSTHORACIC ECHOCARDIOGRAM  08/24/2014   mid anteroseptal and distal septak hypokinetic,  mild focal basal LVH, ef 60-65%,  grade 1 diastolic dysfunction/  mild MR/  mild LAE/  mild dilated RV with normal RVSP/  trivial TR   Social History   Socioeconomic History  . Marital status: Widowed    Spouse name: Not on file  . Number of children: 3  . Years of education: Not on file  . Highest education level: Not on file  Occupational History  . Occupation: retired  Tobacco Use  . Smoking status: Former Smoker    Years: 2.00    Types: Cigarettes    Quit date: 07/28/1960    Years since  quitting: 60.1  . Smokeless tobacco: Never Used  Vaping Use  . Vaping Use: Never used  Substance and Sexual Activity  . Alcohol use: No  . Drug use: No  . Sexual activity: Not Currently  Other Topics Concern  . Not on file  Social History Narrative   Widowed lives with daughter [name]   Social Determinants of Health   Financial Resource Strain: High Risk  . Difficulty of Paying Living Expenses: Hard  Food Insecurity: No Food Insecurity  . Worried About Charity fundraiser in the Last Year: Never true  . Ran Out of Food in the Last Year: Never true  Transportation Needs: No Transportation Needs  . Lack of Transportation (Medical): No  . Lack of  Transportation (Non-Medical): No  Physical Activity: Not on file  Stress: No Stress Concern Present  . Feeling of Stress : Not at all  Social Connections: Unknown  . Frequency of Communication with Friends and Family: Not on file  . Frequency of Social Gatherings with Friends and Family: More than three times a week  . Attends Religious Services: Not on file  . Active Member of Clubs or Organizations: Not on file  . Attends Archivist Meetings: Not on file  . Marital Status: Not on file   Family History  Problem Relation Age of Onset  . Heart disease Mother   . Cancer Mother   . Cancer Father   . Cancer Brother    Allergies  Allergen Reactions  . Ciprofloxacin Other (See Comments)    PER HEART DR  . Ibuprofen Other (See Comments)    Kidney issues   Prior to Admission medications   Medication Sig Start Date End Date Taking? Authorizing Provider  acetaminophen (TYLENOL) 500 MG tablet Take 1,000 mg by mouth every 6 (six) hours as needed for mild pain (for pain).     [provider]  amiodarone (PACERONE) 200 MG tablet Take 1 tablet (200 mg total) by mouth daily. 02/03/20   Loel Dubonnet, NP  apixaban (ELIQUIS) 5 MG TABS tablet Take 1 tablet (5 mg total) by mouth 2 (two) times daily. 05/03/20   [name], NP  ferrous sulfate 325 (65 FE) MG tablet TAKE 1 TABLET BY MOUTH EVERY DAY WITH BREAKFAST 03/07/20   Crecencio Mc, MD  Hydrocortisone (GERHARDT'S BUTT CREAM) CREA Apply 1 application topically 4 (four) times daily. 12/23/19   [place] Donnamarie Poag, DO  lactose free nutrition (BOOST) LIQD Take 237 mLs by mouth. Drinks on [employer], Historical, MD  NUBEQA 300 MG tablet Take 600 mg by mouth 2 (two) times daily. 02/06/20   [provider]  omeprazole (PRILOSEC) 40 MG capsule TAKE 1 CAPSULE BY MOUTH EVERY DAY 03/08/20   [name], MD  polyethylene glycol (MIRALAX / GLYCOLAX) 17 g packet Take 17 g by mouth daily. 12/24/19   [place] Eric J, DO  rosuvastatin (CRESTOR) 40 MG tablet TAKE 1 TABLET BY MOUTH EVERY DAY 07/05/20   [name], MD  tamsulosin (FLOMAX) 0.4 MG CAPS capsule Take 1 capsule (0.4 mg total) by mouth daily after supper. 01/06/18   [name], MD   DG Chest 1 View  Result Date: 09/02/2020 CLINICAL DATA:  Status post coronary bypass graft. EXAM: CHEST  1 VIEW COMPARISON:  Dec 19, 2019. FINDINGS: The heart size and mediastinal contours are within normal limits. Status post coronary bypass graft. No pneumothorax or pleural effusion is noted. Both lungs are clear. The visualized skeletal structures are unremarkable. IMPRESSION: No active disease. Electronically Signed   By: Marijo Conception M.D.   On: 09/02/2020 08:53   CT Head Wo Contrast  Result Date: 09/02/2020 CLINICAL DATA:  Head injury after fall. EXAM: CT HEAD WITHOUT CONTRAST CT CERVICAL SPINE WITHOUT CONTRAST TECHNIQUE: Multidetector CT imaging of the head and cervical spine was performed following the standard protocol without intravenous contrast. Multiplanar CT image reconstructions of the cervical spine were also generated. COMPARISON:  Dec 19, 2019. FINDINGS: CT HEAD FINDINGS Brain: Stable small left frontal meningioma. Ventricular size is within normal limits. Mild diffuse cortical  atrophy is noted. There is no evidence of hemorrhage or acute infarction. Vascular: No hyperdense vessel or unexpected calcification. Skull:  Normal. Negative for fracture or focal lesion. Sinuses/Orbits: Frontal and left ethmoid sinusitis is noted. Other: None. CT CERVICAL SPINE FINDINGS Alignment: Normal. Skull base and vertebrae: No acute fracture. No primary bone lesion or focal pathologic process. Soft tissues and spinal canal: No prevertebral fluid or swelling. No visible canal hematoma. Disc levels: Moderate degenerative disc disease is noted at C3-4, C4-5, C5-6 and C6-7. Upper chest: Negative. Other: None. IMPRESSION: 1. Stable small left frontal meningioma. Mild diffuse cortical atrophy. Frontal and left ethmoid sinusitis. 2. Multilevel degenerative disc disease. No acute abnormality seen in the cervical spine. Electronically Signed   By: Marijo Conception M.D.   On: 09/02/2020 08:33   CT Cervical Spine Wo Contrast  Result Date: 09/02/2020 CLINICAL DATA:  Head injury after fall. EXAM: CT HEAD WITHOUT CONTRAST CT CERVICAL SPINE WITHOUT CONTRAST TECHNIQUE: Multidetector CT imaging of the head and cervical spine was performed following the standard protocol without intravenous contrast. Multiplanar CT image reconstructions of the cervical spine were also generated. COMPARISON:  Dec 19, 2019. FINDINGS: CT HEAD FINDINGS Brain: Stable small left frontal meningioma. Ventricular size is within normal limits. Mild diffuse cortical atrophy is noted. There is no evidence of hemorrhage or acute infarction. Vascular: No hyperdense vessel or unexpected calcification. Skull: Normal. Negative for fracture or focal lesion. Sinuses/Orbits: Frontal and left ethmoid sinusitis is noted. Other: None. CT CERVICAL SPINE FINDINGS Alignment: Normal. Skull base and vertebrae: No acute fracture. No primary bone lesion or focal pathologic process. Soft tissues and spinal canal: No prevertebral fluid or swelling. No visible canal  hematoma. Disc levels: Moderate degenerative disc disease is noted at C3-4, C4-5, C5-6 and C6-7. Upper chest: Negative. Other: None. IMPRESSION: 1. Stable small left frontal meningioma. Mild diffuse cortical atrophy. Frontal and left ethmoid sinusitis. 2. Multilevel degenerative disc disease. No acute abnormality seen in the cervical spine. Electronically Signed   By: Marijo Conception M.D.   On: 09/02/2020 08:33   DG Hip Unilat W or Wo Pelvis 2-3 Views Left  Result Date: 09/02/2020 CLINICAL DATA:  Left hip pain after fall. EXAM: DG HIP (WITH OR WITHOUT PELVIS) 2-3V LEFT COMPARISON:  Dec 19, 2019. FINDINGS: Moderately displaced proximal left femoral neck fracture is noted. Status post right total hip arthroplasty. IMPRESSION: Moderately displaced proximal left femoral neck fracture. Electronically Signed   By: Marijo Conception M.D.   On: 09/02/2020 08:52    Positive ROS: All other systems have been reviewed and were otherwise negative with the exception of those mentioned in the HPI and as above.  Physical Exam: General: Alert, no acute distress Cardiovascular: No pedal edema Respiratory: No cyanosis, no use of accessory musculature GI: No organomegaly, abdomen is soft and non-tender Skin: No lesions in the area of chief complaint Neurologic: Sensation intact distally Psychiatric: Patient is competent for consent with normal mood and affect Lymphatic: No axillary or cervical lymphadenopathy  MUSCULOSKELETAL: Patient is alert and oriented.  He is lying quietly on the ER stretcher.  Left leg is shortened and rotated.  There is severe pain with motion.  The skin is intact.  Neurovascular status is good distally.  Right leg is normal.  Both upper extremities are normal and the spine is normal  Assessment: Displaced subcapital fracture left hip  Plan: Left hip hemiarthroplasty tomorrow.  Risks and benefits have been discussed with the patient and his daughter.    Park Breed,  MD [phone]   09/02/2020 11:34 AM

## 2020-09-02 NOTE — ED Notes (Signed)
Overdue meds: waiting for pharmacy to verify

## 2020-09-02 NOTE — ED Notes (Signed)
Patient transported to X-ray 

## 2020-09-02 NOTE — ED Provider Notes (Signed)
[facility] Emergency Department Provider Note  ____________________________________________  Time seen: Approximately 8:23 AM  I have reviewed the triage vital signs and the nursing notes.   HISTORY  Chief Complaint Fall (Left Hip) and Leg Pain    HPI Carl [name] is a 85 y.o. male with a history of heart failure, AAA, CKD, CAD status post CABG, atrial fibrillation on Eliquis who is brought to the ED this morning after a fall at home.  Patient states that he just got tripped up and fell over .  He did hit his head, no loss of consciousness.  Denies neck pain.  Only complaint is left hip pain.  It is constant, severe, 10/10, nonradiating, worse with movement.  He has been compliant with his medications.  Does not take any diuretics at this time.  Denies vision change paresthesia or motor weakness.     Past Medical History:  Diagnosis Date  . (HFpEF) heart failure with preserved ejection fraction ([place])    a. 08/2016 Echo: EF 50-55%.  [name] AAA (abdominal aortic aneurysm) ([place]) 06/05/2019   abdominal aortic aneurysm measuring 3.1 cm.  . Arthritis    arms  . Bladder calculus   . Chronic anemia   . Chronic anxiety   . CKD (chronic kidney disease), stage III ([place])   . Coronary artery disease CARDIOLOGIST-  DR Rockey Situ   a. CABG 2006 with  LIMA-LAD, SVG-Ramus, SVG-OM, seq SVG-PLV-PDA.  . DOE (dyspnea on exertion)   . Frequent falls 11/2019  . GERD (gastroesophageal reflux disease)   . Heart murmur   . Hematuria   . Hip fracture ([place]) 10/05/2018  . History of bladder stone   . History of colon polyps   . History of non-ST elevation myocardial infarction (NSTEMI) 03/30/2005   post op recovery inguinal hernia repair  . History of pressure ulcer   . History of recurrent UTIs    last admission 08-04-2018 pseudomonas UTI  . Hyperlipidemia   . Hyperplasia of prostate without lower urinary tract symptoms (LUTS)   . Hypertension   . Indwelling Foley catheter  present   . Intracranial hematoma ([place]) 10/05/2018  . Ischemic cardiomyopathy    a. echo (01/13) 45-50% septal akinesis, mild MR; b. Echo  (01/16) ef 60-65%, mid-apical anteroseptal hypokinesis; c. echo 09-16-2016 ef 50-55%  . Left ureteral stone   . Lung nodule    noted 08/ 2018;  followed by pcp  . LVH (left ventricular hypertrophy) 09/16/2016   Moderate noted on ECHO  . Moderate mitral regurgitation    a.08/2016 Echo: Mod MR/TR.  [name] Orthostatic hypotension   . PAC (premature atrial contraction)   . Paroxysmal atrial flutter ([place])    a. dx 08/2016, started on amiodarone.  . Persistent atrial fibrillation [facility]) followed by cardiologist-- dr Rockey Situ   a. dx AFib 08/2016 s/p DCCV 10/2016-->eliquis (CHA2DS2VASc = 5).  . Pneumonia   . Pulmonary hypertension ([place])    last echo in epic 09-16-2016  . Recurrent prostate cancer ([place]) UROLOGIST-- DR BELL   s/p  radioactive prostate seed implants in 1997;    due to PSA rising pt has been doing lupron injection's few past several yrs at dr bell office  . Renal calculi    bilateral per CT 06-18-2018  . S/P CABG x 5 04/04/2005   LIMA to LAD,  SVG to Ramus,  SVG to OM,  seqSVG to PLV and PDA  . Wears glasses      Patient Active Problem List  Diagnosis Date Noted  . History of UTI 02/14/2020  . Constipation 02/14/2020  . Iron deficiency anemia 01/17/2020  . Near syncope   . Protein-calorie malnutrition, severe 12/22/2019  . Fall 12/19/2019  . Weight loss 08/30/2019  . Onychomycosis 08/30/2019  . AAA (abdominal aortic aneurysm) ([place]) 08/26/2018  . Kidney stones 06/30/2018  . Allergic rhinitis 04/26/2018  . Urolithiasis 01/05/2018  . Frequent urination 01/01/2018  . Colon polyps 09/15/2017  . GERD (gastroesophageal reflux disease) 09/15/2017  . Wrist pain, acute, right 05/25/2017  . Low back pain 03/24/2017  . Lung nodule seen on imaging study: 7 mm nodule left midlung 03/13/2017  . Physical deconditioning 03/13/2017  . Normocytic  anemia 03/13/2017  . Fall at home, initial encounter 03/12/2017  . Abdominal soreness 03/12/2017  . Generalized weakness 03/12/2017  . Leukocytosis 03/12/2017  . Hyponatremia 03/12/2017  . Orthostatic hypotension 02/16/2017  . Anticoagulated 09/19/2016  . Prostate cancer ([place]) 09/04/2016  . Persistent atrial fibrillation 09/04/2016  . Cardiomyopathy, ischemic 07/16/2012  . Chronic kidney disease (CKD), stage III (moderate) ([place]) 07/16/2012  . Essential hypertension 08/05/2011  . Murmur 08/05/2011  . 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 Dyslipidemia 02/04/2011     Past Surgical History:  Procedure Laterality Date  . CARDIAC CATHETERIZATION  04/02/2005   dr Doreatha Lew    Critical three-vessel coronary disease --  Essentially normal left ventricular function , ef 55%  . CARDIOVASCULAR STRESS TEST  05-01-2008  dr Doreatha Lew /  dr harding   Low risk nuclear study w/ no ischemia or scar but flattened septal motion/  ef 64%  . CARDIOVERSION N/A 11/19/2016   Procedure: CARDIOVERSION;  Surgeon: Jerline Pain, MD;  Location: [place];  Service: Cardiovascular;  Laterality: N/A;  . COLONOSCOPY N/A 10/23/2016   Procedure: COLONOSCOPY;  Surgeon: Milus Banister, MD;  Location: [facility];  Service: Endoscopy;  Laterality: N/A;  . CORONARY ARTERY BYPASS GRAFT  04/04/2005   dr  Ricard Dillon   LIMA - LAD,  SVG - Ramus, SVG - OM,  seqSVG - PLV and PDA  . CYSTOSCOPY WITH LITHOLAPAXY N/A 05/07/2016   Procedure: CYSTOSCOPY WITH LITHOLAPAXY;  Surgeon: Rana Snare, MD;  Location: [facility];  Service: Urology;  Laterality: N/A;  . CYSTOSCOPY WITH RETROGRADE PYELOGRAM, URETEROSCOPY AND STENT PLACEMENT  09/03/2018   Procedure: CYSTOSCOPY WITH RETROGRADE PYELOGRAM, URETEROSCOPY LASER LITHOTRIPSY AND STENT PLACEMENT;  Surgeon: Lucas Mallow, MD;  Location: [facility];  Service: Urology;;  . CYSTOSCOPY/URETEROSCOPY/HOLMIUM LASER/STENT PLACEMENT Right 06/15/2019   Procedure: CYSTOSCOPY RIGHT URETEROSCOPY  HOLMIUM LASER STENT PLACEMENT;  Surgeon: Lucas Mallow, MD;  Location: [facility];  Service: Urology;  Laterality: Right;  . HOLMIUM LASER APPLICATION N/A [id number]/8250   Procedure: HOLMIUM LASER APPLICATION;  Surgeon: Rana Snare, MD;  Location: [facility];  Service: Urology;  Laterality: N/A;  . INGUINAL HERNIA REPAIR Left 03-30-2005  dr Excell Seltzer   incarcerated   . RADIOACTIVE PROSTATE SEED IMPLANTS  1997  . TOTAL HIP ARTHROPLASTY Right 10/07/2018   Procedure: TOTAL HIP ARTHROPLASTY ANTERIOR APPROACH;  Surgeon: Rod Can, MD;  Location: [facility];  Service: Orthopedics;  Laterality: Right;  . TRANSTHORACIC ECHOCARDIOGRAM  08/24/2014   mid anteroseptal and distal septak hypokinetic,  mild focal basal LVH, ef 60-65%,  grade 1 diastolic dysfunction/  mild MR/  mild LAE/  mild dilated RV with normal RVSP/  trivial TR     Prior to Admission medications   Medication Sig Start Date End Date  Taking? Authorizing Provider  acetaminophen (TYLENOL) 500 MG tablet Take 1,000 mg by mouth every 6 (six) hours as needed for mild pain (for pain).     [provider]  amiodarone (PACERONE) 200 MG tablet Take 1 tablet (200 mg total) by mouth daily. 02/03/20   Loel Dubonnet, NP  apixaban (ELIQUIS) 5 MG TABS tablet Take 1 tablet (5 mg total) by mouth 2 (two) times daily. 05/03/20   [name], NP  ferrous sulfate 325 (65 FE) MG tablet TAKE 1 TABLET BY MOUTH EVERY DAY WITH BREAKFAST 03/07/20   Crecencio Mc, MD  Hydrocortisone (GERHARDT'S BUTT CREAM) CREA Apply 1 application topically 4 (four) times daily. 12/23/19   [place] Donnamarie Poag, DO  lactose free nutrition (BOOST) LIQD Take 237 mLs by mouth. Drinks on [employer], Historical, MD  NUBEQA 300 MG tablet Take 600 mg by mouth 2 (two) times daily. 02/06/20   [provider]  omeprazole (PRILOSEC) 40 MG capsule TAKE 1 CAPSULE BY MOUTH EVERY DAY 03/08/20   [name], MD  polyethylene glycol (MIRALAX /  GLYCOLAX) 17 g packet Take 17 g by mouth daily. 12/24/19   [place] Eric J, DO  rosuvastatin (CRESTOR) 40 MG tablet TAKE 1 TABLET BY MOUTH EVERY DAY 07/05/20   [name], MD  tamsulosin (FLOMAX) 0.4 MG CAPS capsule Take 1 capsule (0.4 mg total) by mouth daily after supper. 01/06/18   [name], MD     Allergies Ciprofloxacin and Ibuprofen   Family History  Problem Relation Age of Onset  . Heart disease Mother   . Cancer Mother   . Cancer Father   . Cancer Brother     Social History Social History   Tobacco Use  . Smoking status: Former Smoker    Years: 2.00    Types: Cigarettes    Quit date: 07/28/1960    Years since quitting: 60.1  . Smokeless tobacco: Never Used  Vaping Use  . Vaping Use: Never used  Substance Use Topics  . Alcohol use: No  . Drug use: No    Review of Systems  Constitutional:   No fever or chills.  ENT:   No sore throat. No rhinorrhea. Cardiovascular:   No chest pain or syncope. Respiratory:   No dyspnea or cough. Gastrointestinal:   Negative for abdominal pain, vomiting and diarrhea.  Musculoskeletal:   Left hip pain as above All other systems reviewed and are negative except as documented above in ROS and HPI.  ____________________________________________   PHYSICAL EXAM:  VITAL SIGNS: ED Triage Vitals  Enc Vitals Group     BP 09/02/20 0736 131/66     Pulse Rate 09/02/20 0736 78     Resp --      Temp 09/02/20 0736 97.7 F (36.5 C)     Temp Source 09/02/20 0736 Oral     SpO2 09/02/20 0736 99 %     Weight 09/02/20 0740 150 lb (68 kg)     Height 09/02/20 0740 5\' 8"  (1.727 m)     Head Circumference --      Peak Flow --      Pain Score 09/02/20 0739 10     Pain Loc --      Pain Edu? --      Excl. in [place]? --     Vital signs reviewed, nursing assessments reviewed.   Constitutional:   Alert and oriented. Non-toxic appearance. Eyes:   Conjunctivae are normal. EOMI. PERRL.  ENT      Head:   Normocephalic.  3 cm subcutaneous  hematoma on left frontal scalp.  No laceration.      Nose:   Normal      Mouth/Throat:   Normal.      Neck:   No meningismus. Full ROM.  No midline spinal tenderness Hematological/Lymphatic/Immunilogical:   No cervical lymphadenopathy. Cardiovascular:   RRR. Symmetric bilateral radial and DP pulses.  No murmurs. Cap refill less than 2 seconds. Respiratory:   Normal respiratory effort without tachypnea/retractions. Breath sounds are clear and equal bilaterally. No wheezes/rales/rhonchi. Gastrointestinal:   Soft and nontender. Non distended. There is no CVA tenderness.  No rebound, rigidity, or guarding.  Musculoskeletal: Tenderness at left proximal femur, pain with passive range of motion.  There is a sternotomy closure wire at the inferior [place] that is protruding through the skin.  Patient states this is chronic, does not appear to be infected Neurologic:   Normal speech and language.  Motor grossly intact. No acute focal neurologic deficits are appreciated.  Skin:    Skin is warm, dry with skin tear on left hand and left elbow without full-thickness laceration.  Hemostatic.  No rash noted.  No petechiae, purpura, or bullae.  ____________________________________________    LABS (pertinent positives/negatives) (all labs ordered are listed, but only abnormal results are displayed) Labs Reviewed  COMPREHENSIVE METABOLIC PANEL - Abnormal; Notable for the following components:      Result Value   CO2 21 (*)    Glucose, Bld 118 (*)    BUN 25 (*)    Creatinine, Ser 1.48 (*)    Calcium 8.4 (*)    Total Protein 5.7 (*)    Albumin 3.1 (*)    AST 135 (*)    ALT 141 (*)    GFR, Estimated 47 (*)    All other components within normal limits  CBC WITH DIFFERENTIAL/PLATELET - Abnormal; Notable for the following components:   RBC 2.85 (*)    Hemoglobin 9.3 (*)    HCT 29.8 (*)    MCV 104.6 (*)    Platelets 129 (*)    Neutro Abs 8.2 (*)    Lymphs Abs 0.5 (*)    All other components within  normal limits  PROTIME-INR - Abnormal; Notable for the following components:   Prothrombin Time 16.6 (*)    INR 1.4 (*)    All other components within normal limits  CK - Abnormal; Notable for the following components:   Total CK 400 (*)    All other components within normal limits  SARS CORONAVIRUS 2 BY RT PCR (HOSPITAL ORDER, [place] [facility])  APTT  TYPE AND SCREEN   ____________________________________________   EKG  Interpreted by me Atrial fibrillation rate of 72.  Normal axis and intervals.  Poor R wave progression.  Normal ST segments and T waves.  ____________________________________________    RADIOLOGY  DG Chest 1 View  Result Date: 09/02/2020 CLINICAL DATA:  Status post coronary bypass graft. EXAM: CHEST  1 VIEW COMPARISON:  Dec 19, 2019. FINDINGS: The heart size and mediastinal contours are within normal limits. Status post coronary bypass graft. No pneumothorax or pleural effusion is noted. Both lungs are clear. The visualized skeletal structures are unremarkable. IMPRESSION: No active disease. Electronically Signed   By: Marijo Conception M.D.   On: 09/02/2020 08:53   CT Head Wo Contrast  Result Date: 09/02/2020 CLINICAL DATA:  Head injury after fall. EXAM: CT HEAD WITHOUT  CONTRAST CT CERVICAL SPINE WITHOUT CONTRAST TECHNIQUE: Multidetector CT imaging of the head and cervical spine was performed following the standard protocol without intravenous contrast. Multiplanar CT image reconstructions of the cervical spine were also generated. COMPARISON:  Dec 19, 2019. FINDINGS: CT HEAD FINDINGS Brain: Stable small left frontal meningioma. Ventricular size is within normal limits. Mild diffuse cortical atrophy is noted. There is no evidence of hemorrhage or acute infarction. Vascular: No hyperdense vessel or unexpected calcification. Skull: Normal. Negative for fracture or focal lesion. Sinuses/Orbits: Frontal and left ethmoid sinusitis is noted. Other: None. CT  CERVICAL SPINE FINDINGS Alignment: Normal. Skull base and vertebrae: No acute fracture. No primary bone lesion or focal pathologic process. Soft tissues and spinal canal: No prevertebral fluid or swelling. No visible canal hematoma. Disc levels: Moderate degenerative disc disease is noted at C3-4, C4-5, C5-6 and C6-7. Upper chest: Negative. Other: None. IMPRESSION: 1. Stable small left frontal meningioma. Mild diffuse cortical atrophy. Frontal and left ethmoid sinusitis. 2. Multilevel degenerative disc disease. No acute abnormality seen in the cervical spine. Electronically Signed   By: Marijo Conception M.D.   On: 09/02/2020 08:33   CT Cervical Spine Wo Contrast  Result Date: 09/02/2020 CLINICAL DATA:  Head injury after fall. EXAM: CT HEAD WITHOUT CONTRAST CT CERVICAL SPINE WITHOUT CONTRAST TECHNIQUE: Multidetector CT imaging of the head and cervical spine was performed following the standard protocol without intravenous contrast. Multiplanar CT image reconstructions of the cervical spine were also generated. COMPARISON:  Dec 19, 2019. FINDINGS: CT HEAD FINDINGS Brain: Stable small left frontal meningioma. Ventricular size is within normal limits. Mild diffuse cortical atrophy is noted. There is no evidence of hemorrhage or acute infarction. Vascular: No hyperdense vessel or unexpected calcification. Skull: Normal. Negative for fracture or focal lesion. Sinuses/Orbits: Frontal and left ethmoid sinusitis is noted. Other: None. CT CERVICAL SPINE FINDINGS Alignment: Normal. Skull base and vertebrae: No acute fracture. No primary bone lesion or focal pathologic process. Soft tissues and spinal canal: No prevertebral fluid or swelling. No visible canal hematoma. Disc levels: Moderate degenerative disc disease is noted at C3-4, C4-5, C5-6 and C6-7. Upper chest: Negative. Other: None. IMPRESSION: 1. Stable small left frontal meningioma. Mild diffuse cortical atrophy. Frontal and left ethmoid sinusitis. 2. Multilevel  degenerative disc disease. No acute abnormality seen in the cervical spine. Electronically Signed   By: Marijo Conception M.D.   On: 09/02/2020 08:33   DG Hip Unilat W or Wo Pelvis 2-3 Views Left  Result Date: 09/02/2020 CLINICAL DATA:  Left hip pain after fall. EXAM: DG HIP (WITH OR WITHOUT PELVIS) 2-3V LEFT COMPARISON:  Dec 19, 2019. FINDINGS: Moderately displaced proximal left femoral neck fracture is noted. Status post right total hip arthroplasty. IMPRESSION: Moderately displaced proximal left femoral neck fracture. Electronically Signed   By: Marijo Conception M.D.   On: 09/02/2020 08:52    ____________________________________________   PROCEDURES Procedures  ____________________________________________  DIFFERENTIAL DIAGNOSIS   Intracranial hemorrhage, C-spine fracture, hip fracture, skull fracture  CLINICAL IMPRESSION / ASSESSMENT AND PLAN / ED COURSE  Medications ordered in the ED: Medications  morphine 4 MG/ML injection 4 mg (has no administration in time range)  morphine 4 MG/ML injection 4 mg (4 mg Intravenous Given 09/02/20 0748)  ondansetron (ZOFRAN) injection 4 mg (4 mg Intravenous Given 09/02/20 0747)    Pertinent labs & imaging results that were available during my care of the patient were reviewed by me and considered in my medical decision making (see chart for  details).  Carl [name] was evaluated in Emergency Department on 09/02/2020 for the symptoms described in the history of present illness. He was evaluated in the context of the global COVID-19 pandemic, which necessitated consideration that the patient might be at risk for infection with the SARS-CoV-2 virus that causes COVID-19. Institutional protocols and algorithms that pertain to the evaluation of patients at risk for COVID-19 are in a state of rapid change based on information released by regulatory bodies including the CDC and federal and state organizations. These policies and algorithms were followed during  the patient's care in the ED.   Patient presents after mechanical fall.  He is on Eliquis, has evidence of head trauma, will get CT scan of the head and neck.  Clinically apparent left hip fracture.  Will obtain x-rays.  Sternotomy wire protruding, chronic per patient, CABG was 15 years ago.  Not infected.  Can be deferred to outpatient follow-up  Clinical Course as of 09/02/20 0916  Sun Sep 02, 2020  0909 Hip x-ray viewed and interpreted by me, shows left femoral neck fracture.  Radiology reports reviewed.  Chest x-ray unremarkable.  CT head and neck unremarkable. [PS]  0915 Injury discussed with Dr. Sabra Heck, will plan to do surgery tomorrow for repair [PS]    Clinical Course User Index [PS] Carrie Mew, MD     ____________________________________________   FINAL CLINICAL IMPRESSION(S) / ED DIAGNOSES    Final diagnoses:  Closed fracture of neck of left femur, initial encounter ([place])  Longstanding persistent atrial fibrillation [facility])     ED Discharge Orders    None      Portions of this note were generated with dragon dictation software. Dictation errors may occur despite best attempts at proofreading.   Carrie Mew, MD 09/02/20 [phone]

## 2020-09-02 NOTE — ED Notes (Signed)
Wound care done to L elbow and L wrist. Wounds/skin tears cleansed with NS and non adhesive dressing applied. Pt also given breakfast tray and assisted with meal.

## 2020-09-02 NOTE — ED Notes (Deleted)
Patient transported to CT 

## 2020-09-02 NOTE — ED Notes (Addendum)
Pt cleaned and gown changed, pt taken off EMS sheet. Pt with skin breakdown to both buttocks. Right buttock with 9cmx4cm  purpleish skin color, macerated in appearance, pea size skin tear on right buttock. Left buttock with 6 cm x 4 cm purple skin color, pea sized open area on Left puttock. Purplish skin on buttocks appears to be healed skin breakdown. Family confirms that pt has had issues with skin breakdown in this area in the past. Sacral decub pad placed, moisture barrier cream applied, external male catheter in place and draining well. Heels elevated, pt positioned on right side.

## 2020-09-02 NOTE — ED Notes (Signed)
Pt sats dropping to 88% on room air, pt sleeping. Pt put on O2 2 L [place], sats increased to 98% on 2 L, pt shows NAD.

## 2020-09-02 NOTE — ED Notes (Signed)
Pt with sternal wire protruding from chest, per daughter and pt, pt had open heart surgery in 2006. Pt has not contacted primary MD or heart surgeon about this. This RN to notify attending and admitting MD along with orthopedic surgery.  New nonstick dressing applied to left hand over marble sized skin tear

## 2020-09-02 NOTE — ED Notes (Signed)
Patient transported to CT 

## 2020-09-03 ENCOUNTER — Inpatient Hospital Stay: Payer: Medicare Other | Admitting: Certified Registered"

## 2020-09-03 ENCOUNTER — Telehealth: Payer: Self-pay

## 2020-09-03 ENCOUNTER — Encounter
Admission: EM | Disposition: A | Discharge status: Skilled Nursing Facility | Payer: Self-pay | Source: Home / Self Care | Attending: Internal Medicine

## 2020-09-03 ENCOUNTER — Inpatient Hospital Stay: Payer: Medicare Other

## 2020-09-03 ENCOUNTER — Other Ambulatory Visit: Payer: Self-pay

## 2020-09-03 ENCOUNTER — Encounter: Payer: Self-pay | Admitting: Internal Medicine

## 2020-09-03 DIAGNOSIS — D649 Anemia, unspecified: Secondary | ICD-10-CM

## 2020-09-03 HISTORY — PX: HIP ARTHROPLASTY: SHX981

## 2020-09-03 LAB — BASIC METABOLIC PANEL
Anion gap: 7 (ref 5–15)
BUN: 23 mg/dL (ref 8–23)
CO2: 23 mmol/L (ref 22–32)
Calcium: 8 mg/dL — ABNORMAL LOW (ref 8.9–10.3)
Chloride: 108 mmol/L (ref 98–111)
Creatinine, Ser: 1.29 mg/dL — ABNORMAL HIGH (ref 0.61–1.24)
GFR, Estimated: 55 mL/min — ABNORMAL LOW (ref >60)
Glucose, Bld: 97 mg/dL (ref 70–99)
Potassium: 4.9 mmol/L (ref 3.5–5.1)
Sodium: 138 mmol/L (ref 135–145)

## 2020-09-03 LAB — CBC
HCT: 26.4 % — ABNORMAL LOW (ref 39.0–52.0)
HCT: 27.3 % — ABNORMAL LOW (ref 39.0–52.0)
Hemoglobin: 8.3 g/dL — ABNORMAL LOW (ref 13.0–17.0)
Hemoglobin: 8.7 g/dL — ABNORMAL LOW (ref 13.0–17.0)
MCH: 33.3 pg (ref 26.0–34.0)
MCH: 31.6 pg (ref 26.0–34.0)
MCHC: 31.4 g/dL (ref 30.0–36.0)
MCHC: 31.9 g/dL (ref 30.0–36.0)
MCV: 106 fL — ABNORMAL HIGH (ref 80.0–100.0)
MCV: 99.3 fL (ref 80.0–100.0)
Platelets: 127 10*3/uL — ABNORMAL LOW (ref 150–400)
Platelets: 109 10*3/uL — ABNORMAL LOW (ref 150–400)
RBC: 2.49 MIL/uL — ABNORMAL LOW (ref 4.22–5.81)
RBC: 2.75 MIL/uL — ABNORMAL LOW (ref 4.22–5.81)
RDW: 15 % (ref 11.5–15.5)
RDW: 18.5 % — ABNORMAL HIGH (ref 11.5–15.5)
WBC: 8.8 10*3/uL (ref 4.0–10.5)
WBC: 13.1 10*3/uL — ABNORMAL HIGH (ref 4.0–10.5)
nRBC: 0 % (ref 0.0–0.2)
nRBC: 0 % (ref 0.0–0.2)

## 2020-09-03 LAB — PREPARE RBC (CROSSMATCH)

## 2020-09-03 LAB — CREATININE, SERUM
Creatinine, Ser: 1.19 mg/dL (ref 0.61–1.24)
GFR, Estimated: >60 mL/min (ref >60)

## 2020-09-03 LAB — CK: Total CK: 330 U/L (ref 49–397)

## 2020-09-03 LAB — GLUCOSE, CAPILLARY: Glucose-Capillary: 115 mg/dL — ABNORMAL HIGH (ref 70–99)

## 2020-09-03 SURGERY — HEMIARTHROPLASTY, HIP, DIRECT ANTERIOR APPROACH, FOR FRACTURE
Anesthesia: General | Site: Hip | Laterality: Left

## 2020-09-03 MED ORDER — ONDANSETRON HCL 4 MG/2ML IJ SOLN
INTRAMUSCULAR | Status: AC
  Filled 2020-09-03: qty 2

## 2020-09-03 MED ORDER — ENSURE ENLIVE PO LIQD
237.0000 mL | Freq: Two times a day (BID) | ORAL | Status: DC
  Administered 2020-09-04 – 2020-09-06 (×4): 237 mL via ORAL
  Filled 2020-09-03 (×2): qty 237

## 2020-09-03 MED ORDER — FENTANYL CITRATE (PF) 100 MCG/2ML IJ SOLN
INTRAMUSCULAR | Status: AC
  Administered 2020-09-03: 25 ug via INTRAVENOUS
  Filled 2020-09-03: qty 2

## 2020-09-03 MED ORDER — EPHEDRINE SULFATE 50 MG/ML IJ SOLN
INTRAMUSCULAR | Status: DC | PRN
  Administered 2020-09-03: 25 mg via INTRAVENOUS
  Administered 2020-09-03: 15 mg via INTRAVENOUS
  Administered 2020-09-03: 10 mg via INTRAVENOUS
  Administered 2020-09-03 (×2): 25 mg via INTRAVENOUS

## 2020-09-03 MED ORDER — LIDOCAINE HCL (CARDIAC) PF 100 MG/5ML IV SOSY
PREFILLED_SYRINGE | INTRAVENOUS | Status: DC | PRN
  Administered 2020-09-03: 30 mg via INTRAVENOUS

## 2020-09-03 MED ORDER — LACTATED RINGERS IV BOLUS
500.0000 mL | Freq: Once | INTRAVENOUS | Status: AC
  Administered 2020-09-03: 500 mL via INTRAVENOUS

## 2020-09-03 MED ORDER — SUCCINYLCHOLINE CHLORIDE 20 MG/ML IJ SOLN
INTRAMUSCULAR | Status: DC | PRN
  Administered 2020-09-03: 80 mg via INTRAVENOUS

## 2020-09-03 MED ORDER — SODIUM CHLORIDE 0.9 % IV SOLN
INTRAVENOUS | Status: DC | PRN
  Administered 2020-09-03: 50 ug/min via INTRAVENOUS

## 2020-09-03 MED ORDER — ACETAMINOPHEN 10 MG/ML IV SOLN
INTRAVENOUS | Status: DC | PRN
  Administered 2020-09-03: 1000 mg via INTRAVENOUS

## 2020-09-03 MED ORDER — PHENYLEPHRINE HCL (PRESSORS) 10 MG/ML IV SOLN
INTRAVENOUS | Status: DC | PRN
  Administered 2020-09-03: 100 ug via INTRAVENOUS
  Administered 2020-09-03 (×2): 200 ug via INTRAVENOUS
  Administered 2020-09-03: 100 ug via INTRAVENOUS
  Administered 2020-09-03: 200 ug via INTRAVENOUS
  Administered 2020-09-03: 100 ug via INTRAVENOUS
  Administered 2020-09-03: 200 ug via INTRAVENOUS
  Administered 2020-09-03: 100 ug via INTRAVENOUS
  Administered 2020-09-03 (×2): 200 ug via INTRAVENOUS

## 2020-09-03 MED ORDER — VASOPRESSIN 20 UNIT/ML IV SOLN
INTRAVENOUS | Status: AC
  Filled 2020-09-03: qty 1

## 2020-09-03 MED ORDER — FLEET ENEMA 7-19 GM/118ML RE ENEM: 1.0000 | ENEMA | Freq: Once | RECTAL | Status: DC | PRN

## 2020-09-03 MED ORDER — ADULT MULTIVITAMIN W/MINERALS CH
1.0000 | ORAL_TABLET | Freq: Every day | ORAL | Status: DC
  Administered 2020-09-04 – 2020-09-07 (×4): 1 via ORAL
  Filled 2020-09-03 (×5): qty 1

## 2020-09-03 MED ORDER — BISACODYL 10 MG RE SUPP
10.0000 mg | Freq: Every day | RECTAL | Status: DC | PRN
  Filled 2020-09-03: qty 1

## 2020-09-03 MED ORDER — FENTANYL CITRATE (PF) 100 MCG/2ML IJ SOLN
INTRAMUSCULAR | Status: AC
  Filled 2020-09-03: qty 2

## 2020-09-03 MED ORDER — ONDANSETRON HCL 4 MG/2ML IJ SOLN
INTRAMUSCULAR | Status: DC | PRN
  Administered 2020-09-03: 4 mg via INTRAVENOUS

## 2020-09-03 MED ORDER — ENOXAPARIN SODIUM 30 MG/0.3ML ~~LOC~~ SOLN
30.0000 mg | SUBCUTANEOUS | Status: DC
  Administered 2020-09-04: 30 mg via SUBCUTANEOUS
  Filled 2020-09-03 (×2): qty 0.3

## 2020-09-03 MED ORDER — CEFAZOLIN SODIUM-DEXTROSE 2-3 GM-%(50ML) IV SOLR
INTRAVENOUS | Status: DC | PRN
  Administered 2020-09-03: 2 g via INTRAVENOUS

## 2020-09-03 MED ORDER — PHENOL 1.4 % MT LIQD
1.0000 | OROMUCOSAL | Status: DC | PRN
  Filled 2020-09-03: qty 177

## 2020-09-03 MED ORDER — SODIUM CHLORIDE 0.9 % IV SOLN: 10.0000 mL/h | Freq: Once | INTRAVENOUS | Status: DC

## 2020-09-03 MED ORDER — METOCLOPRAMIDE HCL 10 MG PO TABS: 5.0000 mg | ORAL_TABLET | Freq: Three times a day (TID) | ORAL | Status: DC | PRN

## 2020-09-03 MED ORDER — DOCUSATE SODIUM 100 MG PO CAPS
100.0000 mg | ORAL_CAPSULE | Freq: Two times a day (BID) | ORAL | Status: DC
  Administered 2020-09-03 – 2020-09-07 (×6): 100 mg via ORAL
  Filled 2020-09-03 (×9): qty 1

## 2020-09-03 MED ORDER — MENTHOL 3 MG MT LOZG
1.0000 | LOZENGE | OROMUCOSAL | Status: DC | PRN
  Filled 2020-09-03: qty 9

## 2020-09-03 MED ORDER — ROCURONIUM BROMIDE 100 MG/10ML IV SOLN
INTRAVENOUS | Status: DC | PRN
  Administered 2020-09-03: 10 mg via INTRAVENOUS
  Administered 2020-09-03: 30 mg via INTRAVENOUS
  Administered 2020-09-03: 10 mg via INTRAVENOUS

## 2020-09-03 MED ORDER — ONDANSETRON HCL 4 MG PO TABS: 4.0000 mg | ORAL_TABLET | Freq: Four times a day (QID) | ORAL | Status: DC | PRN

## 2020-09-03 MED ORDER — PROPOFOL 10 MG/ML IV BOLUS
INTRAVENOUS | Status: DC | PRN
  Administered 2020-09-03: 60 mg via INTRAVENOUS

## 2020-09-03 MED ORDER — METOCLOPRAMIDE HCL 5 MG/ML IJ SOLN: 5.0000 mg | Freq: Three times a day (TID) | INTRAMUSCULAR | Status: DC | PRN

## 2020-09-03 MED ORDER — VASOPRESSIN 20 UNIT/ML IV SOLN
INTRAVENOUS | Status: DC | PRN
  Administered 2020-09-03 (×2): 2 [IU] via INTRAVENOUS

## 2020-09-03 MED ORDER — PROMETHAZINE HCL 25 MG/ML IJ SOLN: 6.2500 mg | Freq: Once | INTRAMUSCULAR | Status: AC

## 2020-09-03 MED ORDER — EPHEDRINE 5 MG/ML INJ
INTRAVENOUS | Status: AC
  Filled 2020-09-03: qty 10

## 2020-09-03 MED ORDER — LIDOCAINE HCL (PF) 2 % IJ SOLN
INTRAMUSCULAR | Status: AC
  Filled 2020-09-03: qty 5

## 2020-09-03 MED ORDER — ACETAMINOPHEN 325 MG PO TABS
325.0000 mg | ORAL_TABLET | Freq: Four times a day (QID) | ORAL | Status: DC | PRN
  Administered 2020-09-04: 650 mg via ORAL
  Filled 2020-09-03: qty 2

## 2020-09-03 MED ORDER — ONDANSETRON HCL 4 MG/2ML IJ SOLN: 4.0000 mg | Freq: Once | INTRAMUSCULAR | Status: DC | PRN

## 2020-09-03 MED ORDER — LACTATED RINGERS IV BOLUS
250.0000 mL | Freq: Once | INTRAVENOUS | Status: AC
  Administered 2020-09-03: 250 mL via INTRAVENOUS

## 2020-09-03 MED ORDER — SODIUM CHLORIDE 0.45 % IV SOLN: INTRAVENOUS | Status: DC

## 2020-09-03 MED ORDER — CLINDAMYCIN PHOSPHATE 600 MG/50ML IV SOLN
INTRAVENOUS | Status: DC | PRN
  Administered 2020-09-03: 600 mg via INTRAVENOUS

## 2020-09-03 MED ORDER — PHENYLEPHRINE HCL (PRESSORS) 10 MG/ML IV SOLN
INTRAVENOUS | Status: AC
  Filled 2020-09-03: qty 1

## 2020-09-03 MED ORDER — ZOLPIDEM TARTRATE 5 MG PO TABS
5.0000 mg | ORAL_TABLET | Freq: Every evening | ORAL | Status: DC | PRN
  Filled 2020-09-03: qty 1

## 2020-09-03 MED ORDER — SUCCINYLCHOLINE CHLORIDE 200 MG/10ML IV SOSY
PREFILLED_SYRINGE | INTRAVENOUS | Status: AC
  Filled 2020-09-03: qty 10

## 2020-09-03 MED ORDER — PROMETHAZINE HCL 25 MG/ML IJ SOLN
INTRAMUSCULAR | Status: AC
  Administered 2020-09-03: 6.25 mg via INTRAVENOUS
  Filled 2020-09-03: qty 1

## 2020-09-03 MED ORDER — ALUM & MAG HYDROXIDE-SIMETH 200-200-20 MG/5ML PO SUSP: 30.0000 mL | ORAL | Status: DC | PRN

## 2020-09-03 MED ORDER — FENTANYL CITRATE (PF) 100 MCG/2ML IJ SOLN: 25.0000 ug | INTRAMUSCULAR | Status: DC | PRN

## 2020-09-03 MED ORDER — BUPIVACAINE-EPINEPHRINE (PF) 0.25% -1:200000 IJ SOLN
INTRAMUSCULAR | Status: DC | PRN
  Administered 2020-09-03: 30 mL

## 2020-09-03 MED ORDER — GLYCOPYRROLATE 0.2 MG/ML IJ SOLN
INTRAMUSCULAR | Status: DC | PRN
  Administered 2020-09-03: .2 mg via INTRAVENOUS

## 2020-09-03 MED ORDER — CHLORHEXIDINE GLUCONATE CLOTH 2 % EX PADS: 6.0000 | MEDICATED_PAD | Freq: Every day | CUTANEOUS | Status: DC

## 2020-09-03 MED ORDER — PROPOFOL 10 MG/ML IV BOLUS
INTRAVENOUS | Status: AC
  Filled 2020-09-03: qty 20

## 2020-09-03 MED ORDER — SUGAMMADEX SODIUM 200 MG/2ML IV SOLN
INTRAVENOUS | Status: DC | PRN
  Administered 2020-09-03: 200 mg via INTRAVENOUS

## 2020-09-03 MED ORDER — CEFAZOLIN SODIUM-DEXTROSE 2-4 GM/100ML-% IV SOLN
2.0000 g | Freq: Three times a day (TID) | INTRAVENOUS | Status: AC
  Administered 2020-09-03 – 2020-09-04 (×3): 2 g via INTRAVENOUS
  Filled 2020-09-03 (×3): qty 100

## 2020-09-03 MED ORDER — FENTANYL CITRATE (PF) 100 MCG/2ML IJ SOLN
INTRAMUSCULAR | Status: DC | PRN
  Administered 2020-09-03: 50 ug via INTRAVENOUS
  Administered 2020-09-03 (×2): 25 ug via INTRAVENOUS

## 2020-09-03 MED ORDER — DEXAMETHASONE SODIUM PHOSPHATE 10 MG/ML IJ SOLN
INTRAMUSCULAR | Status: AC
  Filled 2020-09-03: qty 1

## 2020-09-03 MED ORDER — MORPHINE SULFATE (PF) 2 MG/ML IV SOLN
0.5000 mg | INTRAVENOUS | Status: DC | PRN
  Administered 2020-09-03 – 2020-09-04 (×3): 1 mg via INTRAVENOUS
  Filled 2020-09-03 (×3): qty 1

## 2020-09-03 MED ORDER — ACETAMINOPHEN 10 MG/ML IV SOLN
INTRAVENOUS | Status: AC
  Filled 2020-09-03: qty 100

## 2020-09-03 MED ORDER — KETAMINE HCL 50 MG/ML IJ SOLN
INTRAMUSCULAR | Status: DC | PRN
  Administered 2020-09-03: 10 mg via INTRAVENOUS
  Administered 2020-09-03 (×2): 20 mg via INTRAVENOUS

## 2020-09-03 MED ORDER — INFLUENZA VAC A&B SA ADJ QUAD 0.5 ML IM PRSY
0.5000 mL | PREFILLED_SYRINGE | INTRAMUSCULAR | Status: AC
  Administered 2020-09-05: 0.5 mL via INTRAMUSCULAR
  Filled 2020-09-03: qty 0.5

## 2020-09-03 MED ORDER — ROCURONIUM BROMIDE 10 MG/ML (PF) SYRINGE
PREFILLED_SYRINGE | INTRAVENOUS | Status: AC
  Filled 2020-09-03: qty 10

## 2020-09-03 MED ORDER — KETAMINE HCL 50 MG/5ML IJ SOSY
PREFILLED_SYRINGE | INTRAMUSCULAR | Status: AC
  Filled 2020-09-03: qty 5

## 2020-09-03 MED ORDER — ONDANSETRON HCL 4 MG/2ML IJ SOLN: 4.0000 mg | Freq: Four times a day (QID) | INTRAMUSCULAR | Status: DC | PRN

## 2020-09-03 MED ORDER — DEXAMETHASONE SODIUM PHOSPHATE 10 MG/ML IJ SOLN
INTRAMUSCULAR | Status: DC | PRN
  Administered 2020-09-03: 5 mg via INTRAVENOUS

## 2020-09-03 MED ORDER — CLINDAMYCIN PHOSPHATE 600 MG/50ML IV SOLN
600.0000 mg | Freq: Three times a day (TID) | INTRAVENOUS | Status: AC
  Administered 2020-09-03 – 2020-09-04 (×3): 600 mg via INTRAVENOUS
  Filled 2020-09-03 (×3): qty 50

## 2020-09-03 MED ORDER — SODIUM CHLORIDE 0.9 % IR SOLN
Status: DC | PRN
  Administered 2020-09-03: 1000 mL

## 2020-09-03 MED ORDER — TRANEXAMIC ACID-NACL 1000-0.7 MG/100ML-% IV SOLN
INTRAVENOUS | Status: AC | PRN
  Administered 2020-09-03: 1000 mg via INTRAVENOUS

## 2020-09-03 SURGICAL SUPPLY — 61 items
APL PRP STRL LF DISP 70% ISPRP (MISCELLANEOUS) ×2
BLADE DEBAKEY 8.0 (BLADE) ×2 IMPLANT
BLADE SAGITTAL WIDE XTHICK NO (BLADE) ×2 IMPLANT
BLADE SURG SZ10 CARB STEEL (BLADE) ×2 IMPLANT
CANISTER SUCT 1200ML W/VALVE (MISCELLANEOUS) ×3 IMPLANT
CHLORAPREP W/TINT 26 (MISCELLANEOUS) ×4 IMPLANT
COVER BACK TABLE REUSABLE LG (DRAPES) ×2 IMPLANT
COVER WAND RF STERILE (DRAPES) ×2 IMPLANT
DRAPE INCISE IOBAN 66X60 STRL (DRAPES) ×4 IMPLANT
DRSG AQUACEL AG ADV 3.5X10 (GAUZE/BANDAGES/DRESSINGS) ×2 IMPLANT
DRSG AQUACEL AG ADV 3.5X14 (GAUZE/BANDAGES/DRESSINGS) ×2 IMPLANT
ELECT BLADE 6.5 EXT (BLADE) ×2 IMPLANT
ELECT CAUTERY BLADE 6.4 (BLADE) ×2 IMPLANT
ELECT REM PT RETURN 9FT ADLT (ELECTROSURGICAL) ×2
ELECTRODE REM PT RTRN 9FT ADLT (ELECTROSURGICAL) ×1 IMPLANT
GAUZE SPONGE 4X4 12PLY STRL (GAUZE/BANDAGES/DRESSINGS) ×2 IMPLANT
GAUZE XEROFORM 1X8 LF (GAUZE/BANDAGES/DRESSINGS) ×3 IMPLANT
GLOVE INDICATOR 8.0 STRL GRN (GLOVE) ×2 IMPLANT
GLOVE SURG ORTHO 8.5 STRL (GLOVE) ×2 IMPLANT
GOWN STRL REUS W/ TWL LRG LVL3 (GOWN DISPOSABLE) ×2 IMPLANT
GOWN STRL REUS W/TWL LRG LVL3 (GOWN DISPOSABLE) ×4
GOWN STRL REUS W/TWL LRG LVL4 (GOWN DISPOSABLE) ×2 IMPLANT
HEAD MODULAR ENDO (Orthopedic Implant) ×2 IMPLANT
HEAD UNPLR 54XMDLR STRL HIP (Orthopedic Implant) IMPLANT
HEMOVAC 400CC 10FR (MISCELLANEOUS) ×2 IMPLANT
HOLDER FOLEY CATH W/STRAP (MISCELLANEOUS) ×1 IMPLANT
IV NS 1000ML (IV SOLUTION) ×2
IV NS 1000ML BAXH (IV SOLUTION) ×1 IMPLANT
KIT TURNOVER KIT A (KITS) ×2 IMPLANT
MANIFOLD NEPTUNE II (INSTRUMENTS) ×2 IMPLANT
NDL FILTER BLUNT 18X1 1/2 (NEEDLE) ×1 IMPLANT
NDL MAYO CATGUT SZ4 TPR NDL (NEEDLE) ×1 IMPLANT
NDL SPNL 18GX3.5 QUINCKE PK (NEEDLE) ×2 IMPLANT
NEEDLE FILTER BLUNT 18X 1/2SAF (NEEDLE) ×1
NEEDLE FILTER BLUNT 18X1 1/2 (NEEDLE) ×1 IMPLANT
NEEDLE MAYO CATGUT SZ4 (NEEDLE) ×2 IMPLANT
NEEDLE SPNL 18GX3.5 QUINCKE PK (NEEDLE) ×4 IMPLANT
NS IRRIG 1000ML POUR BTL (IV SOLUTION) ×2 IMPLANT
PACK HIP PROSTHESIS (MISCELLANEOUS) ×2 IMPLANT
PAD ABD DERMACEA PRESS 5X9 (GAUZE/BANDAGES/DRESSINGS) ×2 IMPLANT
PILLOW ABDUCTION MEDIUM (MISCELLANEOUS) ×1 IMPLANT
PULSAVAC PLUS IRRIG FAN TIP (DISPOSABLE) ×2
SLEEVE UNITRAX V40 (Orthopedic Implant) ×2 IMPLANT
SLEEVE UNITRAX V40 +4 (Orthopedic Implant) IMPLANT
SOL PREP PVP 2OZ (MISCELLANEOUS) ×2
SOLUTION PREP PVP 2OZ (MISCELLANEOUS) ×1 IMPLANT
SPONGE LAP 18X18 RF (DISPOSABLE) ×2 IMPLANT
STAPLER SKIN PROX 35W (STAPLE) ×2 IMPLANT
STEM HIP 127 DEG (Stem) ×1 IMPLANT
SUT DVC 2 QUILL PDO  T11 36X36 (SUTURE) ×4
SUT DVC 2 QUILL PDO T11 36X36 (SUTURE) ×2 IMPLANT
SUT QUILL PDO 0 36 36 VIOLET (SUTURE) ×2 IMPLANT
SUT TICRON 2-0 30IN 311381 (SUTURE) ×10 IMPLANT
SYR 10ML LL (SYRINGE) ×2 IMPLANT
SYR 30ML LL (SYRINGE) ×2 IMPLANT
SYR 50ML LL SCALE MARK (SYRINGE) ×2 IMPLANT
TAPE MICROFOAM 4IN (TAPE) ×2 IMPLANT
TIP FAN IRRIG PULSAVAC PLUS (DISPOSABLE) ×1 IMPLANT
TRAY FOLEY SLVR 16FR LF STAT (SET/KITS/TRAYS/PACK) ×1 IMPLANT
TUBE SUCT KAM VAC (TUBING) ×2 IMPLANT
WATER STERILE IRR 1000ML POUR (IV SOLUTION) ×2 IMPLANT

## 2020-09-03 NOTE — Transfer of Care (Signed)
Immediate Anesthesia Transfer of Care Note  Patient: Carl [name]  Procedure(s) Performed: ARTHROPLASTY BIPOLAR HIP (HEMIARTHROPLASTY) (Left Hip)  Patient Location: PACU  Anesthesia Type:General  Level of Consciousness: sedated  Airway & Oxygen Therapy: Patient Spontanous Breathing and Patient connected to face mask oxygen  Post-op Assessment: Report given to RN and Post -op Vital signs reviewed and stable  Post vital signs: Reviewed  Last Vitals:  Vitals Value Taken Time  BP 108/53 09/03/20 1056  Temp    Pulse 85 09/03/20 1057  Resp 15 09/03/20 1057  SpO2 100 % 09/03/20 1057  Vitals shown include unvalidated device data.  Last Pain:  Vitals:   09/03/20 0753  TempSrc: Oral  PainSc: 9          Complications: No complications documented.

## 2020-09-03 NOTE — Op Note (Signed)
09/02/2020 - 09/03/2020  10:50 AM  PATIENT:  Carl [name]   MRN: [id number]  PRE-OPERATIVE DIAGNOSIS:  Displaced Subcapital fracture left hip   POST-OPERATIVE DIAGNOSIS: Same  PROCEDURE:  left   hip hemiarthroplasty with St[employer]ker Accolade prosthesis  PREOPERATIVE INDICATIONS:  Carl [name] is an 85 y.o. male who was admitted 09/02/2020 with a diagnosis of displaced subcapital fracture of the hip and elected for surgical management.  The risks benefits and alternatives were discussed with the patient including but not limited to the risks of nonoperative treatment, versus surgical intervention including infection, bleeding, nerve injury, periprosthetic fracture, the need for revision surgery, dislocation, leg length discrepancy, blood clots, cardiopulmonary complications, morbidity, mortality, among others, and they were willing to proceed.  Predicted outcome is good, although there will be at least a six to nine month expected recovery.     SURGEON:  Earnestine Leys, MD  ASST:    ANESTHESIA: General endotracheal    COMPLICATIONS:  None.   EBL:  250  cc    COMPONENTS:  Stryker Accolade Femoral Fracture stem size # 5  ,   and a size   54 mm  fracture head unipolar hip ball with    +4 mm  neck length.    PROCEDURE IN DETAIL: The patient was met in the holding area and identified.  The appropriate hip  was marked at the operative site. The patient was then transported to the OR and  placed under general anesthesia.  At that point, the patient was  placed in the lateral decubitus position with the operative side up and  secured to the operating room table and all bony prominences padded.     The operative lower extremity was prepped from the iliac crest to the toes.  Sterile draping was performed.  Time out was performed prior to incision.      A routine posterolateral approach was utilized via sharp dissection  carried down to the subcutaneous tissue.  Gross bleeders were Bovie   coagulated.  The iliotibial band was identified and incised  along the length of the skin incision.  Self-retaining retractors were  inserted.  With the hip internally rotated, the short external rotators  were identified. The piriformis was tagged and the hip capsule released in a T-type fashion.  The femoral neck was exposed, and I resected the femoral neck using the appropriate jig. This was performed at approximately a thumb's breadth above the lesser trochanter.    I then exposed the deep acetabulum, cleared out any tissue including the ligamentum teres.    I then prepared the proximal femur using the cookie-cutter, the lateralizing reamer, and then sequentially broached.  A trial stem   was  utilized along with a unipolar head and neck.  I reduced the hip and it was found to have excellent stability with functional range of motion. Leg lengths were equal.  The trial components were then removed.   The same size Accolade femoral stem was then inserted and was very stable.  The Unitrax head and neck as trialed were inserted as well.     The hip was then reduced and taken through functional range of motion and found to have excellent stability. Leg lengths were restored.     I closed the T in the capsule with #2 Ticron as well as the short external rotators. A hemovac was inserted.    I then irrigated the hip copiously again with pulse lavage, and repaired the fascia with #2  Quill and the subcutaneous layer with #0 Quill. Sponge and needle counts were correct. Dry sterile Aquacell was applied.   The patient was then awakened and returned to PACU in stable and satisfactory condition. There were no complications.  Park Breed, MD Orthopedic Surgeon [phone]   09/03/2020 10:50 AM

## 2020-09-03 NOTE — Progress Notes (Signed)
Initial Nutrition Assessment  RD working remotely.  DOCUMENTATION CODES:   Not applicable  INTERVENTION:  Provide Ensure Enlive po BID, each supplement provides 350 kcal and 20 grams of protein.  Provide MVI po daily.  NUTRITION DIAGNOSIS:   Increased nutrient needs related to post-op healing as evidenced by estimated needs.  GOAL:   Patient will meet greater than or equal to 90% of their needs  MONITOR:   PO intake,Supplement acceptance,Labs,Weight trends,Diet advancement,Skin,I & O's  REASON FOR ASSESSMENT:   Consult Assessment of nutrition requirement/status  ASSESSMENT:   85 year old male with PMHx of HTN, HLD, GERD, anxiety, CAD s/p CABG, ischemic cardiomyopathy, moderate mitral regurgitation, CKD stage III, arthritis, A-fib, recurrent UTIs admitted with displaced subcapital fracture of left hip.   2/7 s/p left hip hemiarthroplasty  Unable to reach patient over the phone. He was in OR and then taken to PACU. Per chart patient confused. Patient has been NPO for OR today. He was on regular diet yesterday but no meal documentation available. Patient with increased nutrient needs for post-operative healing. He would benefit from oral nutrition supplements.  Per chart patient was 62-65 kg in 2021. Currently documented to be 70.5 kg (155.42 lbs).  Medications reviewed and include: ferrous sulfate 325 mg daily, Zofran, Protonix, Boost daily (not on formulary), Miralax 17 grams daily, senna 1 tablet BID, NS at 75 mL/hr.  Labs reviewed: CBG 115, Creatinine 1.29.  Unable to determine if patient meets criteria for malnutrition at this time.   NUTRITION - FOCUSED PHYSICAL EXAM:  Unable to complete at this time.   Diet Order:   Diet Order            Diet NPO time specified  Diet effective midnight                EDUCATION NEEDS:   No education needs have been identified at this time  Skin:  Skin Assessment: Skin Integrity Issues: Skin Integrity Issues::  Incisions Incisions: closed incision left hip  Last BM:  08/29/2020 per chart  Height:   Ht Readings from Last 1 Encounters:  09/02/20 5\' 8"  (1.727 m)   Weight:   Wt Readings from Last 1 Encounters:  09/02/20 70.5 kg   BMI:  Body mass index is 23.63 kg/m.  Estimated Nutritional Needs:   Kcal:  1800-2000  Protein:  90-100 grams  Fluid:  1.8 L/day  Jacklynn Barnacle, MS, RD, LDN Pager number available on Amion

## 2020-09-03 NOTE — Progress Notes (Signed)
PROGRESS NOTE    Carl [name]  ELF:[id number] DOB: June 28, 1936 DOA: 09/02/2020 PCP: [name], MD    Assessment & Plan:   Principal Problem:   Closed left hip fracture [facility]) Active Problems:   Essential hypertension   Chronic kidney disease (CKD), stage III (moderate) (HCC)   Prostate CA ([place])   Fall   Transaminitis   AF (paroxysmal atrial fibrillation) (HCC)   Carl [name] is a 85 y.o. male with medical history significant for paroxysmal atrial fibrillation on anticoagulation therapy with Eliquis, chronic diastolic dysfunction CHF, abdominal aortic aneurysm, stage III chronic kidney disease and coronary artery disease s/p CABG who presented to the ER via EMS for evaluation following a fall at home.  Patient stated that he tripped and fell landing on his left side.  He denies having any dizziness or lightheadedness prior to the fall and denies any loss of consciousness.  He hit his head and upon presentation to the ER complained of neck pain and pain in his left hip.    Closed left hip fracture Following a mechanical fall at home --found to have a moderately displaced proximal left femoral neck fracture. Plan: --left HEMIARTHROPLASTY today with Dr. Sabra Heck --pain control --PT eval  Post-op hypotension --BP down to 80's post-op.  likely due to blood loss (~300 ml), anesthesia.   --Received 1.5L bolus, 3u pRBC, phenylephrine x1 in the post-op with improvement to 100's. Plan: --transfer to PCU   History of paroxysmal atrial fibrillation on Eliquis Patient is currently in sinus rhythm and is rate controlled on amiodarone Plan: --cont home amiodarone --resume Eliquis tomorrow  Transaminitis --unclear etiology --trend LFT's  History of prostate cancer --cont home darolutamide  stage 2 chronic kidney disease Stable  Acute on chronic anemia, due to blood loss --Hgb 8-9's. --needed 3u pRBC total during and after surgery. Plan: --monitor and  transfuse to keep Hgb >7  Thrombocytopenia --monitor for now   DVT prophylaxis: Lovenox SQ Code Status: DNR  Family Communication:  Status is: inpatient Dispo:   The patient is from: home Anticipated d/c is to: SNF rehab Anticipated d/c date is: 2-3 days Patient currently is not medically ready to d/c due to: just post-op   Subjective and Interval History:  Pt went for ortho surgery today.  Post-op, pt was hypotensive, requiring boluses, 3u pRBC and pressor x1, therefore pt was transferred to PCU.  Pt was alert, though noted to be confused and have garbled speech.     Objective: Vitals:   09/03/20 1445 09/03/20 1500 09/03/20 1508 09/03/20 1549  BP: (!) 103/58 (!) 102/59 (!) 101/59 108/60  Pulse: 83 83 85 86  Resp: 15 13 13 18   Temp:   98.7 F (37.1 C) 98.7 F (37.1 C)  TempSrc:   Temporal Oral  SpO2: 96% 97% 97% 98%  Weight:      Height:        Intake/Output Summary (Last 24 hours) at 09/03/2020 1609 Last data filed at 09/03/2020 1518 Gross per 24 hour  Intake 2766.28 ml  Output 1040 ml  Net 1726.28 ml   Filed Weights   09/02/20 0740 09/02/20 2134  Weight: 68 kg 70.5 kg    Examination:   Constitutional: NAD, alert, confused, garbled speech. HEENT: conjunctivae and lids normal, EOMI CV: No cyanosis.   RESP: normal respiratory effort, on 2L Extremities: Left leg in traction device SKIN: warm, dry Neuro: II - XII grossly intact.  Strength equal in both arms and hands.   Data  Reviewed: I have personally reviewed following labs and imaging studies  CBC: Recent Labs  Lab 09/02/20 0758 09/02/20 1136 09/03/20 0503 09/03/20 1118  WBC 9.2 10.9* 8.8 13.1*  NEUTROABS 8.2*  --   --   --   HGB 9.3* 8.7* 8.3* 8.7*  HCT 29.8* 27.4* 26.4* 27.3*  MCV 104.6* 105.0* 106.0* 99.3  PLT 129* 127* 127* 0000000*   Basic Metabolic Panel: Recent Labs  Lab 09/02/20 0758 09/03/20 0503 09/03/20 1118  NA 136 138  --   K 5.0 4.9  --   CL 107 108  --   CO2 21* 23  --    GLUCOSE 118* 97  --   BUN 25* 23  --   CREATININE 1.48* 1.29* 1.19  CALCIUM 8.4* 8.0*  --    GFR: Estimated Creatinine Clearance: 46.3 mL/min (by C-G formula based on SCr of 1.19 mg/dL). Liver Function Tests: Recent Labs  Lab 09/02/20 0758  AST 135*  ALT 141*  ALKPHOS 80  BILITOT 0.7  PROT 5.7*  ALBUMIN 3.1*   No results for input(s): LIPASE, AMYLASE in the last 168 hours. No results for input(s): AMMONIA in the last 168 hours. Coagulation Profile: Recent Labs  Lab 09/02/20 0758 09/02/20 1136  INR 1.4* 1.4*   Cardiac Enzymes: Recent Labs  Lab 09/02/20 0758 09/03/20 0503  CKTOTAL 400* 330   BNP (last 3 results) No results for input(s): PROBNP in the last 8760 hours. HbA1C: No results for input(s): HGBA1C in the last 72 hours. CBG: Recent Labs  Lab 09/03/20 1305  GLUCAP 115*   Lipid Profile: No results for input(s): CHOL, HDL, LDLCALC, TRIG, CHOLHDL, LDLDIRECT in the last 72 hours. Thyroid Function Tests: No results for input(s): TSH, T4TOTAL, FREET4, T3FREE, THYROIDAB in the last 72 hours. Anemia Panel: No results for input(s): VITAMINB12, FOLATE, FERRITIN, TIBC, IRON, RETICCTPCT in the last 72 hours. Sepsis Labs: No results for input(s): PROCALCITON, LATICACIDVEN in the last 168 hours.  Recent Results (from the past 240 hour(s))  SARS Coronavirus 2 by RT PCR (hospital order, performed in [facility] hospital lab) Nasopharyngeal Nasopharyngeal Swab     Status: None   Collection Time: 09/02/20  9:21 AM   Specimen: Nasopharyngeal Swab  Result Value Ref Range Status   SARS Coronavirus 2 NEGATIVE NEGATIVE Final    Comment: (NOTE) SARS-CoV-2 target nucleic acids are NOT DETECTED.  The SARS-CoV-2 RNA is generally detectable in upper and lower respiratory specimens during the acute phase of infection. The lowest concentration of SARS-CoV-2 viral copies this assay can detect is 250 copies / mL. A negative result does not preclude SARS-CoV-2 infection and  should not be used as the sole basis for treatment or other patient management decisions.  A negative result may occur with improper specimen collection / handling, submission of specimen other than nasopharyngeal swab, presence of viral mutation(s) within the areas targeted by this assay, and inadequate number of viral copies (<250 copies / mL). A negative result must be combined with clinical observations, patient history, and epidemiological information.  Fact Sheet for Patients:   StrictlyIdeas.no  Fact Sheet for Healthcare Providers: BankingDealers.co.za  This test is not yet approved or  cleared by the [place] FDA and has been authorized for detection and/or diagnosis of SARS-CoV-2 by FDA under an Emergency Use Authorization (EUA).  This EUA will remain in effect (meaning this test can be used) for the duration of the COVID-19 declaration under Section 564(b)(1) of the Act, 21 U.S.C. section 360bbb-3(b)(1), unless  the authorization is terminated or revoked sooner.  Performed at [facility], [address], [place], [place] [address]       Radiology Studies: DG Chest 1 View  Result Date: 09/02/2020 CLINICAL DATA:  Status post coronary bypass graft. EXAM: CHEST  1 VIEW COMPARISON:  Dec 19, 2019. FINDINGS: The heart size and mediastinal contours are within normal limits. Status post coronary bypass graft. No pneumothorax or pleural effusion is noted. Both lungs are clear. The visualized skeletal structures are unremarkable. IMPRESSION: No active disease. Electronically Signed   By: Marijo Conception M.D.   On: 09/02/2020 08:53   CT Head Wo Contrast  Result Date: 09/02/2020 CLINICAL DATA:  Head injury after fall. EXAM: CT HEAD WITHOUT CONTRAST CT CERVICAL SPINE WITHOUT CONTRAST TECHNIQUE: Multidetector CT imaging of the head and cervical spine was performed following the standard protocol without intravenous contrast.  Multiplanar CT image reconstructions of the cervical spine were also generated. COMPARISON:  Dec 19, 2019. FINDINGS: CT HEAD FINDINGS Brain: Stable small left frontal meningioma. Ventricular size is within normal limits. Mild diffuse cortical atrophy is noted. There is no evidence of hemorrhage or acute infarction. Vascular: No hyperdense vessel or unexpected calcification. Skull: Normal. Negative for fracture or focal lesion. Sinuses/Orbits: Frontal and left ethmoid sinusitis is noted. Other: None. CT CERVICAL SPINE FINDINGS Alignment: Normal. Skull base and vertebrae: No acute fracture. No primary bone lesion or focal pathologic process. Soft tissues and spinal canal: No prevertebral fluid or swelling. No visible canal hematoma. Disc levels: Moderate degenerative disc disease is noted at C3-4, C4-5, C5-6 and C6-7. Upper chest: Negative. Other: None. IMPRESSION: 1. Stable small left frontal meningioma. Mild diffuse cortical atrophy. Frontal and left ethmoid sinusitis. 2. Multilevel degenerative disc disease. No acute abnormality seen in the cervical spine. Electronically Signed   By: Marijo Conception M.D.   On: 09/02/2020 08:33   CT Cervical Spine Wo Contrast  Result Date: 09/02/2020 CLINICAL DATA:  Head injury after fall. EXAM: CT HEAD WITHOUT CONTRAST CT CERVICAL SPINE WITHOUT CONTRAST TECHNIQUE: Multidetector CT imaging of the head and cervical spine was performed following the standard protocol without intravenous contrast. Multiplanar CT image reconstructions of the cervical spine were also generated. COMPARISON:  Dec 19, 2019. FINDINGS: CT HEAD FINDINGS Brain: Stable small left frontal meningioma. Ventricular size is within normal limits. Mild diffuse cortical atrophy is noted. There is no evidence of hemorrhage or acute infarction. Vascular: No hyperdense vessel or unexpected calcification. Skull: Normal. Negative for fracture or focal lesion. Sinuses/Orbits: Frontal and left ethmoid sinusitis is noted.  Other: None. CT CERVICAL SPINE FINDINGS Alignment: Normal. Skull base and vertebrae: No acute fracture. No primary bone lesion or focal pathologic process. Soft tissues and spinal canal: No prevertebral fluid or swelling. No visible canal hematoma. Disc levels: Moderate degenerative disc disease is noted at C3-4, C4-5, C5-6 and C6-7. Upper chest: Negative. Other: None. IMPRESSION: 1. Stable small left frontal meningioma. Mild diffuse cortical atrophy. Frontal and left ethmoid sinusitis. 2. Multilevel degenerative disc disease. No acute abnormality seen in the cervical spine. Electronically Signed   By: Marijo Conception M.D.   On: 09/02/2020 08:33   DG Hip Port Unilat With Pelvis 1V Left  Result Date: 09/03/2020 CLINICAL DATA:  Post-op right (sic) hip. EXAM: DG HIP (WITH OR WITHOUT PELVIS) 1V PORT LEFT COMPARISON:  Radiographs 09/02/2020 and 12/19/2019. FINDINGS: AP view of the lower pelvis and cross-table lateral view of the left hip. Interval LEFT hip replacement (probable bipolar hemiarthroplasty).  The hardware is well positioned. No evidence of acute fracture or dislocation. A surgical drain is in place, and there is mild gas in the soft tissues lateral to the left hip. Previous right total hip arthroplasty and sequela of prostate brachytherapy noted. IMPRESSION: Interval LEFT hip arthroplasty without demonstrated complication. Electronically Signed   By: Richardean Sale M.D.   On: 09/03/2020 11:31   DG Hip Unilat W or Wo Pelvis 2-3 Views Left  Result Date: 09/02/2020 CLINICAL DATA:  Left hip pain after fall. EXAM: DG HIP (WITH OR WITHOUT PELVIS) 2-3V LEFT COMPARISON:  Dec 19, 2019. FINDINGS: Moderately displaced proximal left femoral neck fracture is noted. Status post right total hip arthroplasty. IMPRESSION: Moderately displaced proximal left femoral neck fracture. Electronically Signed   By: Marijo Conception M.D.   On: 09/02/2020 08:52     Scheduled Meds: . amiodarone  200 mg Oral Daily  .  darolutamide  600 mg Oral BID  . docusate sodium  100 mg Oral BID  . [START ON 09/04/2020] enoxaparin (LOVENOX) injection  30 mg Subcutaneous Q24H  . [START ON 09/04/2020] feeding supplement  237 mL Oral BID BM  . ferrous sulfate  325 mg Oral Q breakfast  . lactose free nutrition  237 mL Oral Q24H  . [START ON 09/04/2020] multivitamin with minerals  1 tablet Oral Daily  . ondansetron      . pantoprazole  40 mg Oral Daily  . polyethylene glycol  17 g Oral Daily  . senna  1 tablet Oral BID  . tamsulosin  0.4 mg Oral QPC supper   Continuous Infusions: . sodium chloride    . sodium chloride 75 mL/hr at 09/03/20 0824  .  ceFAZolin (ANCEF) IV    . clindamycin (CLEOCIN) IV    . methocarbamol (ROBAXIN) IV       LOS: 1 day     Enzo Bi, MD [facility] If 7PM-7AM, please contact night-coverage 09/03/2020, 4:09 PM

## 2020-09-03 NOTE — Anesthesia Preprocedure Evaluation (Signed)
Anesthesia Evaluation  Patient identified by MRN, date of birth, ID band Patient awake    Reviewed: Allergy & Precautions, H&P , NPO status , Patient's Chart, lab work & pertinent test results  History of Anesthesia Complications Negative for: history of anesthetic complications  Airway Mallampati: II       Dental  (+) Edentulous Upper, Missing   Pulmonary shortness of breath, neg sleep apnea, neg COPD, former smoker,   Productive cough  breath sounds clear to auscultation+ rhonchi  + decreased breath sounds      Cardiovascular hypertension, (-) angina+ CAD, + Past MI, + CABG, +CHF and + DOE  (-) Cardiac Stents + dysrhythmias Atrial Fibrillation  Rhythm:irregular Rate:Normal  Moderate pulmonary hypertension   Neuro/Psych Anxiety negative neurological ROS  negative psych ROS   GI/Hepatic Neg liver ROS, GERD  ,  Endo/Other  negative endocrine ROS  Renal/GU Renal disease (CKD)     Musculoskeletal   Abdominal   Peds  Hematology  (+) Blood dyscrasia, anemia ,   Anesthesia Other Findings Last dose of Eliquis was on 09/01/20, so not a candidate for spinal.  Poor candidate for GA due to heart history, pulmonary hypertension, current productive cough.  Discussed concerns with surgeon and he feels this surgery needs to proceed today due to risk of further complications if delayed until tomorrow.  KR  Reproductive/Obstetrics negative OB ROS                             Anesthesia Physical Anesthesia Plan  ASA: IV  Anesthesia Plan: General ETT   Post-op Pain Management:    Induction:   PONV Risk Score and Plan: Ondansetron, Dexamethasone, Midazolam and Treatment may vary due to age or medical condition  Airway Management Planned:   Additional Equipment:   Intra-op Plan:   Post-operative Plan:   Informed Consent: I have reviewed the patients History and Physical, chart, labs and discussed  the procedure including the risks, benefits and alternatives for the proposed anesthesia with the patient or authorized representative who has indicated his/her understanding and acceptance.     Dental Advisory Given  Plan Discussed with: Anesthesiologist and CRNA  Anesthesia Plan Comments:         Anesthesia Quick Evaluation

## 2020-09-03 NOTE — Anesthesia Procedure Notes (Signed)
Procedure Name: Intubation Performed by: Shanta Dorvil, CRNA Pre-anesthesia Checklist: Patient identified, Patient being monitored, Timeout performed, Emergency Drugs available and Suction available Patient Re-evaluated:Patient Re-evaluated prior to induction Oxygen Delivery Method: Circle system utilized Preoxygenation: Pre-oxygenation with 100% oxygen Induction Type: IV induction Ventilation: Mask ventilation without difficulty Laryngoscope Size: McGraph and 4 Grade View: Grade I Tube type: Oral Tube size: 7.5 mm Number of attempts: 1 Airway Equipment and Method: Stylet and Video-laryngoscopy Placement Confirmation: ETT inserted through vocal cords under direct vision,  positive ETCO2 and breath sounds checked- equal and bilateral Secured at: 22 cm Tube secured with: Tape Dental Injury: Teeth and Oropharynx as per pre-operative assessment        

## 2020-09-03 NOTE — Telephone Encounter (Signed)
Received update that patient currently in hospital after a fall resulting in injury of hip fracture. Hospital liaisons made aware.

## 2020-09-03 NOTE — Progress Notes (Signed)
Pt seems more confused than before.  Was previously disoriented to time and place.  Currently talking about property tax and cars.  When asked why he is here now pt talking about his mouth.  Still able to move all extremities.  Speech seems more garbled again.  Difficult to get accurate assessment.  Dr Lubertha Basque at bedside currently.

## 2020-09-03 NOTE — H&P (Signed)
THE PATIENT WAS SEEN PRIOR TO SURGERY TODAY.  HISTORY, ALLERGIES, HOME MEDICATIONS AND OPERATIVE PROCEDURE WERE REVIEWED. RISKS AND BENEFITS OF SURGERY DISCUSSED WITH PATIENT AGAIN.  NO CHANGES FROM INITIAL HISTORY AND PHYSICAL NOTED.    

## 2020-09-04 ENCOUNTER — Encounter: Payer: Self-pay | Admitting: Specialist

## 2020-09-04 DIAGNOSIS — R410 Disorientation, unspecified: Secondary | ICD-10-CM

## 2020-09-04 LAB — BPAM RBC
Blood Product Expiration Date: 202202102359
Blood Product Expiration Date: 202202122359
Blood Product Expiration Date: 202202182359
ISSUE DATE / TIME: 202202070850
ISSUE DATE / TIME: 202202070850
ISSUE DATE / TIME: 202202071316
Unit Type and Rh: 9500
Unit Type and Rh: 9500
Unit Type and Rh: 9500

## 2020-09-04 LAB — CBC
HCT: 26.7 % — ABNORMAL LOW (ref 39.0–52.0)
Hemoglobin: 8.9 g/dL — ABNORMAL LOW (ref 13.0–17.0)
MCH: 32 pg (ref 26.0–34.0)
MCHC: 33.3 g/dL (ref 30.0–36.0)
MCV: 96 fL (ref 80.0–100.0)
Platelets: 105 10*3/uL — ABNORMAL LOW (ref 150–400)
RBC: 2.78 MIL/uL — ABNORMAL LOW (ref 4.22–5.81)
RDW: 18.6 % — ABNORMAL HIGH (ref 11.5–15.5)
WBC: 12.2 10*3/uL — ABNORMAL HIGH (ref 4.0–10.5)
nRBC: 0 % (ref 0.0–0.2)

## 2020-09-04 LAB — TYPE AND SCREEN
ABO/RH(D): O NEG
Antibody Screen: NEGATIVE
Unit division: 0
Unit division: 0
Unit division: 0

## 2020-09-04 LAB — BASIC METABOLIC PANEL
Anion gap: 6 (ref 5–15)
BUN: 31 mg/dL — ABNORMAL HIGH (ref 8–23)
CO2: 23 mmol/L (ref 22–32)
Calcium: 7.8 mg/dL — ABNORMAL LOW (ref 8.9–10.3)
Chloride: 105 mmol/L (ref 98–111)
Creatinine, Ser: 1.28 mg/dL — ABNORMAL HIGH (ref 0.61–1.24)
GFR, Estimated: 56 mL/min — ABNORMAL LOW (ref >60)
Glucose, Bld: 114 mg/dL — ABNORMAL HIGH (ref 70–99)
Potassium: 4.8 mmol/L (ref 3.5–5.1)
Sodium: 134 mmol/L — ABNORMAL LOW (ref 135–145)

## 2020-09-04 LAB — MAGNESIUM: Magnesium: 1.8 mg/dL (ref 1.7–2.4)

## 2020-09-04 MED ORDER — APIXABAN 5 MG PO TABS
5.0000 mg | ORAL_TABLET | Freq: Two times a day (BID) | ORAL | Status: DC
  Administered 2020-09-05 – 2020-09-07 (×5): 5 mg via ORAL
  Filled 2020-09-04 (×5): qty 1

## 2020-09-04 MED ORDER — QUETIAPINE FUMARATE 25 MG PO TABS
50.0000 mg | ORAL_TABLET | Freq: Every day | ORAL | Status: DC
  Administered 2020-09-04 – 2020-09-05 (×2): 50 mg via ORAL
  Filled 2020-09-04 (×2): qty 2

## 2020-09-04 MED ORDER — ENOXAPARIN SODIUM 40 MG/0.4ML ~~LOC~~ SOLN: 40.0000 mg | SUBCUTANEOUS | Status: DC

## 2020-09-04 NOTE — Progress Notes (Signed)
Subjective: 1 Day Post-Op Procedure(s) (LRB): ARTHROPLASTY BIPOLAR HIP (HEMIARTHROPLASTY) (Left)   Patient is sitting up and eating lunch.  He is alert and oriented.  He looks much better.  Moderate pain.  Dressing is dry.  Hemoglobin is stable at 8.9.  Blood pressure stable.  Will need skilled nursing placement.  We will switch him to an abduction pillow as he tends to internally rotate the leg.  The Hemovac was removed.  Patient reports pain as mild.  Objective:   VITALS:   Vitals:   09/04/20 0825 09/04/20 1116  BP: (!) 109/56 (!) 107/51  Pulse: 88 75  Resp: 18 18  Temp: 97.7 F (36.5 C) 97.7 F (36.5 C)  SpO2: 90% 99%    Neurologically intact Sensation intact distally Intact pulses distally Incision: dressing C/D/I  LABS Recent Labs    09/03/20 0503 09/03/20 1118 09/04/20 0532  HGB 8.3* 8.7* 8.9*  HCT 26.4* 27.3* 26.7*  WBC 8.8 13.1* 12.2*  PLT 127* 109* 105*    Recent Labs    09/02/20 0758 09/03/20 0503 09/03/20 1118 09/04/20 0532  NA 136 138  --  134*  K 5.0 4.9  --  4.8  BUN 25* 23  --  31*  CREATININE 1.48* 1.29* 1.19 1.28*  GLUCOSE 118* 97  --  114*    Recent Labs    09/02/20 0758 09/02/20 1136  INR 1.4* 1.4*     Assessment/Plan: 1 Day Post-Op Procedure(s) (LRB): ARTHROPLASTY BIPOLAR HIP (HEMIARTHROPLASTY) (Left)   Advance diet Up with therapy D/C IV fluids Discharge to SNF   Resume Eliquis tomorrow  Partial weightbearing left leg  Return to clinic in 2 weeks for x-rays and staple removal

## 2020-09-04 NOTE — Progress Notes (Signed)
[facility]) Hospital Liaison RN note:  This patient is currently followed by out patient based palliative care with [facility]. Isaias Cowman, TOC is aware. [place] Liaison will follow for disposition.  Zandra Abts, RN [facility] Liaison [phone]

## 2020-09-04 NOTE — TOC Initial Note (Signed)
Transition of Care [facility]) - Initial/Assessment Note    Patient Details  Name: Carl [name] MRN: [id number] Date of Birth: 01-Nov-1935  Transition of Care [facility]) CM/SW Contact:    Beverly Sessions, RN Phone Number: 09/04/2020, 4:29 PM  Clinical Narrative:                  Patient admitted from home with hip fracture Assessment completed via phone with daughter   Patient lives in his own home.  Daughter lives with him PCP [name].   [facility]   Patient has WC and RW in the home   PT has assessed patient and recommends SNF   Daughter in agreement  Existing PASRR fl2 sent for signature Bed search initiated  Daughter requested that I call son for facility preference.  VM left  Expected Discharge Plan: Skilled Nursing Facility Barriers to Discharge: Continued Medical Work up   Patient Goals and CMS Choice        Expected Discharge Plan and Services Expected Discharge Plan: [place]   Discharge Planning Services: CM Consult   Living arrangements for the past 2 months: Single Family Home                                      Prior Living Arrangements/Services Living arrangements for the past 2 months: Single Family Home Lives with:: Adult Children Patient language and need for interpreter reviewed:: Yes Do you feel safe going back to the place where you live?: Yes      Need for Family Participation in Patient Care: Yes (Comment) Care giver support system in place?: Yes (comment) Current home services: DME Criminal Activity/Legal Involvement Pertinent to Current Situation/Hospitalization: No - Comment as needed  Activities of Daily Living Home Assistive Devices/Equipment: Walker (specify type) ADL Screening (condition at time of admission) Patient's cognitive ability adequate to safely complete daily activities?: Yes Is the patient deaf or have difficulty hearing?: No Does the patient have  difficulty seeing, even when wearing glasses/contacts?: No Does the patient have difficulty concentrating, remembering, or making decisions?: No Patient able to express need for assistance with ADLs?: Yes Does the patient have difficulty dressing or bathing?: Yes Independently performs ADLs?: No Communication: Independent Dressing (OT): Needs assistance Is this a change from baseline?: Pre-admission baseline Grooming: Needs assistance Is this a change from baseline?: Pre-admission baseline Feeding: Independent Bathing: Needs assistance Is this a change from baseline?: Pre-admission baseline Toileting: Needs assistance Is this a change from baseline?: Pre-admission baseline In/Out Bed: Needs assistance Is this a change from baseline?: Pre-admission baseline Walks in Home: Independent with device (comment) Does the patient have difficulty walking or climbing stairs?: Yes Weakness of Legs: Left Weakness of Arms/Hands: None  Permission Sought/Granted                  Emotional Assessment       Orientation: : Oriented to Self,Oriented to Place   Psych Involvement: No (comment)  Admission diagnosis:  Closed left hip fracture ([place]) [S72.002A] Closed fracture of neck of left femur, initial encounter ([place]) [S72.002A] Longstanding persistent atrial fibrillation ([place]) [I48.11] Patient Active Problem List   Diagnosis Date Noted  . Closed left hip fracture ([place]) 09/02/2020  . Transaminitis 09/02/2020  . AF (paroxysmal atrial fibrillation) ([place]) 09/02/2020  . History of UTI 02/14/2020  . Constipation 02/14/2020  . Iron deficiency anemia 01/17/2020  .  Near syncope   . Protein-calorie malnutrition, severe 12/22/2019  . Fall 12/19/2019  . Weight loss 08/30/2019  . Onychomycosis 08/30/2019  . AAA (abdominal aortic aneurysm) ([place]) 08/26/2018  . Kidney stones 06/30/2018  . Allergic rhinitis 04/26/2018  . Urolithiasis 01/05/2018  . Frequent urination 01/01/2018  . Colon polyps  09/15/2017  . GERD (gastroesophageal reflux disease) 09/15/2017  . Wrist pain, acute, right 05/25/2017  . Low back pain 03/24/2017  . Lung nodule seen on imaging study: 7 mm nodule left midlung 03/13/2017  . Physical deconditioning 03/13/2017  . Normocytic anemia 03/13/2017  . Fall at home, initial encounter 03/12/2017  . Abdominal soreness 03/12/2017  . Generalized weakness 03/12/2017  . Leukocytosis 03/12/2017  . Hyponatremia 03/12/2017  . Orthostatic hypotension 02/16/2017  . Anticoagulated 09/19/2016  . Prostate CA ([place]) 09/04/2016  . Persistent atrial fibrillation 09/04/2016  . Cardiomyopathy, ischemic 07/16/2012  . Chronic kidney disease (CKD), stage III (moderate) ([place]) 07/16/2012  . Essential hypertension 08/05/2011  . Murmur 08/05/2011  . 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 Dyslipidemia 02/04/2011   PCP:  [name], MD Pharmacy:   [id number] [place], [address] [place] [address] Phone: [phone] Fax: [phone]     Social Determinants of Health (SDOH) Interventions    Readmission Risk Interventions Readmission Risk Prevention Plan 09/04/2020  Transportation Screening Complete  Social Work Consult for [place] Planning/Counseling Complete  Medication Review [employer]) Complete  Some recent data might be hidden

## 2020-09-04 NOTE — Progress Notes (Signed)
PROGRESS NOTE    Carl [name]  WJX:[id number] DOB: 06/14/1936 DOA: 09/02/2020 PCP: [name], MD    Assessment & Plan:   Principal Problem:   Closed left hip fracture [facility]) Active Problems:   Essential hypertension   Chronic kidney disease (CKD), stage III (moderate) (HCC)   Prostate CA ([employer])   Fall   Transaminitis   AF (paroxysmal atrial fibrillation) (HCC)   Carl [name] is a 85 y.o. male with medical history significant for paroxysmal atrial fibrillation on anticoagulation therapy with Eliquis, chronic diastolic dysfunction CHF, abdominal aortic aneurysm, stage III chronic kidney disease and coronary artery disease s/p CABG who presented to the ER via EMS for evaluation following a fall at home.  Patient stated that he tripped and fell landing on his left side.  He denies having any dizziness or lightheadedness prior to the fall and denies any loss of consciousness.  He hit his head and upon presentation to the ER complained of neck pain and pain in his left hip.    Closed left hip fracture S/p left HEMIARTHROPLASTY on 2/7 Following a mechanical fall at home --found to have a moderately displaced proximal left femoral neck fracture. Plan: --pain control --PT eval pending --D/C IV fluids --Partial weightbearing left leg --Return to clinic in 2 weeks for x-rays and staple removal  Post-op hypotension, improved --BP down to 80's post-op.  likely due to blood loss (~300 ml), anesthesia.   --Received 1.5L bolus, 3u pRBC, phenylephrine x1 in the post-op with improvement to 213'Y. --systolic stable 865'H today Plan: --can transfer out to [facility] delirium --pt has been confused, hallucinating. --Seroquel nightly   History of paroxysmal atrial fibrillation on Eliquis Patient is currently in sinus rhythm and is rate controlled on amiodarone Plan: --cont home amiodarone --resume Eliquis tomorrow, per ortho  Transaminitis --unclear  etiology --trend LFT's  History of prostate cancer --cont home darolutamide  stage 2 chronic kidney disease Stable  Acute on chronic anemia, due to blood loss --Hgb 8-9's. --needed 3u pRBC total during and after surgery. Plan: --monitor and transfuse to keep Hgb >7  Thrombocytopenia --monitor for now   DVT prophylaxis: QI:ONGEXBM Code Status: DNR  Family Communication:  Status is: inpatient Dispo:   The patient is from: home Anticipated d/c is to: SNF rehab Anticipated d/c date is: 2-3 days Patient currently is not medically ready to d/c due to: too confused to work with PT    Subjective and Interval History:  Pt was alert, talking, but not making any sense.  BP low normal but stable.     Objective: Vitals:   09/04/20 0418 09/04/20 0825 09/04/20 1116 09/04/20 1614  BP: 116/61 (!) 109/56 (!) 107/51 (!) 114/58  Pulse: 88 88 75 95  Resp: 20 18 18 19   Temp: 98.4 F (36.9 C) 97.7 F (36.5 C) 97.7 F (36.5 C) 97.7 F (36.5 C)  TempSrc:      SpO2: 100% 90% 99% 98%  Weight: 73.1 kg     Height:        Intake/Output Summary (Last 24 hours) at 09/04/2020 1919 Last data filed at 09/04/2020 1838 Gross per 24 hour  Intake 1051.71 ml  Output 1050 ml  Net 1.71 ml   Filed Weights   09/02/20 0740 09/02/20 2134 09/04/20 0418  Weight: 68 kg 70.5 kg 73.1 kg    Examination:   Constitutional: NAD, alert, not oriented, confused HEENT: conjunctivae and lids normal, EOMI CV: No cyanosis.   RESP: normal  respiratory effort, on RA Extremities: No effusions, edema in BLE SKIN: warm, dry Neuro: II - XII grossly intact.     Data Reviewed: I have personally reviewed following labs and imaging studies  CBC: Recent Labs  Lab 09/02/20 0758 09/02/20 1136 09/03/20 0503 09/03/20 1118 09/04/20 0532  WBC 9.2 10.9* 8.8 13.1* 12.2*  NEUTROABS 8.2*  --   --   --   --   HGB 9.3* 8.7* 8.3* 8.7* 8.9*  HCT 29.8* 27.4* 26.4* 27.3* 26.7*  MCV 104.6* 105.0* 106.0* 99.3 96.0  PLT  129* 127* 127* 109* 081*   Basic Metabolic Panel: Recent Labs  Lab 09/02/20 0758 09/03/20 0503 09/03/20 1118 09/04/20 0532  NA 136 138  --  134*  K 5.0 4.9  --  4.8  CL 107 108  --  105  CO2 21* 23  --  23  GLUCOSE 118* 97  --  114*  BUN 25* 23  --  31*  CREATININE 1.48* 1.29* 1.19 1.28*  CALCIUM 8.4* 8.0*  --  7.8*  MG  --   --   --  1.8   GFR: Estimated Creatinine Clearance: 43 mL/min (A) (by C-G formula based on SCr of 1.28 mg/dL (H)). Liver Function Tests: Recent Labs  Lab 09/02/20 0758  AST 135*  ALT 141*  ALKPHOS 80  BILITOT 0.7  PROT 5.7*  ALBUMIN 3.1*   No results for input(s): LIPASE, AMYLASE in the last 168 hours. No results for input(s): AMMONIA in the last 168 hours. Coagulation Profile: Recent Labs  Lab 09/02/20 0758 09/02/20 1136  INR 1.4* 1.4*   Cardiac Enzymes: Recent Labs  Lab 09/02/20 0758 09/03/20 0503  CKTOTAL 400* 330   BNP (last 3 results) No results for input(s): PROBNP in the last 8760 hours. HbA1C: No results for input(s): HGBA1C in the last 72 hours. CBG: Recent Labs  Lab 09/03/20 1305  GLUCAP 115*   Lipid Profile: No results for input(s): CHOL, HDL, LDLCALC, TRIG, CHOLHDL, LDLDIRECT in the last 72 hours. Thyroid Function Tests: No results for input(s): TSH, T4TOTAL, FREET4, T3FREE, THYROIDAB in the last 72 hours. Anemia Panel: No results for input(s): VITAMINB12, FOLATE, FERRITIN, TIBC, IRON, RETICCTPCT in the last 72 hours. Sepsis Labs: No results for input(s): PROCALCITON, LATICACIDVEN in the last 168 hours.  Recent Results (from the past 240 hour(s))  SARS Coronavirus 2 by RT PCR (hospital order, performed in [facility] hospital lab) Nasopharyngeal Nasopharyngeal Swab     Status: None   Collection Time: 09/02/20  9:21 AM   Specimen: Nasopharyngeal Swab  Result Value Ref Range Status   SARS Coronavirus 2 NEGATIVE NEGATIVE Final    Comment: (NOTE) SARS-CoV-2 target nucleic acids are NOT DETECTED.  The SARS-CoV-2  RNA is generally detectable in upper and lower respiratory specimens during the acute phase of infection. The lowest concentration of SARS-CoV-2 viral copies this assay can detect is 250 copies / mL. A negative result does not preclude SARS-CoV-2 infection and should not be used as the sole basis for treatment or other patient management decisions.  A negative result may occur with improper specimen collection / handling, submission of specimen other than nasopharyngeal swab, presence of viral mutation(s) within the areas targeted by this assay, and inadequate number of viral copies (<250 copies / mL). A negative result must be combined with clinical observations, patient history, and epidemiological information.  Fact Sheet for Patients:   StrictlyIdeas.no  Fact Sheet for Healthcare Providers: BankingDealers.co.za  This test is not yet  approved or  cleared by the [place] and has been authorized for detection and/or diagnosis of SARS-CoV-2 by FDA under an Emergency Use Authorization (EUA).  This EUA will remain in effect (meaning this test can be used) for the duration of the COVID-19 declaration under Section 564(b)(1) of the Act, 21 U.S.C. section 360bbb-3(b)(1), unless the authorization is terminated or revoked sooner.  Performed at [facility], [address], [place], [place] [address]       Radiology Studies: DG Hip Port Unilat With Pelvis 1V Left  Result Date: 09/03/2020 CLINICAL DATA:  Post-op right (sic) hip. EXAM: DG HIP (WITH OR WITHOUT PELVIS) 1V PORT LEFT COMPARISON:  Radiographs 09/02/2020 and 12/19/2019. FINDINGS: AP view of the lower pelvis and cross-table lateral view of the left hip. Interval LEFT hip replacement (probable bipolar hemiarthroplasty). The hardware is well positioned. No evidence of acute fracture or dislocation. A surgical drain is in place, and there is mild gas in the soft tissues  lateral to the left hip. Previous right total hip arthroplasty and sequela of prostate brachytherapy noted. IMPRESSION: Interval LEFT hip arthroplasty without demonstrated complication. Electronically Signed   By: Richardean Sale M.D.   On: 09/03/2020 11:31     Scheduled Meds: . amiodarone  200 mg Oral Daily  . Chlorhexidine Gluconate Cloth  6 each Topical Daily  . darolutamide  600 mg Oral BID  . docusate sodium  100 mg Oral BID  . [START ON 09/05/2020] enoxaparin (LOVENOX) injection  40 mg Subcutaneous Q24H  . feeding supplement  237 mL Oral BID BM  . ferrous sulfate  325 mg Oral Q breakfast  . influenza vaccine adjuvanted  0.5 mL Intramuscular Tomorrow-1000  . lactose free nutrition  237 mL Oral Q24H  . multivitamin with minerals  1 tablet Oral Daily  . pantoprazole  40 mg Oral Daily  . polyethylene glycol  17 g Oral Daily  . senna  1 tablet Oral BID  . tamsulosin  0.4 mg Oral QPC supper   Continuous Infusions: . sodium chloride 75 mL/hr at 09/04/20 1800  . methocarbamol (ROBAXIN) IV       LOS: 2 days     Enzo Bi, MD [facility] If 7PM-7AM, please contact night-coverage 09/04/2020, 7:19 PM

## 2020-09-04 NOTE — Progress Notes (Signed)
PT Cancellation Note  Patient Details Name: Carl [name] MRN: [id number] DOB: 05/19/1936   Cancelled Treatment:    Reason Eval/Treat Not Completed: Patient's level of consciousness. Patient is hallucinating. Talking to people that are not in his room. Asking for the keys to his truck so he can move it. Tried to re-orient patient. Patient is not appropriate for PT at this time due to confusion. Will re-attempt PT session tomorrow.    Abrial Arrighi 09/04/2020, 3:33 PM

## 2020-09-04 NOTE — Anesthesia Postprocedure Evaluation (Signed)
Anesthesia Post Note  Patient: Carl [name]  Procedure(s) Performed: ARTHROPLASTY BIPOLAR HIP (HEMIARTHROPLASTY) (Left Hip)  Patient location during evaluation: PACU Anesthesia Type: General Level of consciousness: awake and alert Pain management: pain level controlled Vital Signs Assessment: post-procedure vital signs reviewed and stable Respiratory status: spontaneous breathing, nonlabored ventilation and respiratory function stable Cardiovascular status: blood pressure returned to baseline and stable Postop Assessment: no apparent nausea or vomiting Anesthetic complications: no   No complications documented.   Last Vitals:  Vitals:   09/03/20 2015 09/04/20 0418  BP: (!) 100/57 116/61  Pulse: 85 88  Resp: 19 20  Temp: 37.1 C 36.9 C  SpO2: 100% 100%    Last Pain:  Vitals:   09/04/20 2712  TempSrc:   PainSc: [place]

## 2020-09-04 NOTE — Evaluation (Signed)
Physical Therapy Evaluation Patient Details Name: Carl [name] MRN: [id number] DOB: 06/27/1936 Today's Date: 09/04/2020   History of Present Illness  Carl [name] is a 85 y.o. male with medical history significant for paroxysmal atrial fibrillation on anticoagulation therapy with Eliquis, chronic diastolic dysfunction CHF, abdominal aortic aneurysm, stage III chronic kidney disease and coronary artery disease who presents to the ER via EMS for evaluation following a fall at home.  Patient states that he tripped and fell landing on his left side.  He denies having any dizziness or lightheadedness prior to the fall and denies any loss of consciousness.  He hit his head and upon presentation to the ER complained of neck pain and pain in his left hip.  He rates his left hip pain a 10 x 10 in intensity at its worst, nonradiating and worse with any form of movement.  Patient was unable to bear weight on the left leg due to pain. S/P L hemiarthroplasty.    Clinical Impression  Patient received in bed, just finished getting bath from NT. Patient fatigued and in pain from this. Pain medicine given prior to my arrival per my request. Patient is lethargic and requires max assist for supine >< sit. Unable to attain independent sitting at edge of bed. Leaning to his right side. Patient falling asleep during session. He will continue to benefit from skilled PT while here to improve functional mobility, independence and strength.        Follow Up Recommendations SNF;Supervision/Assistance - 24 hour    Equipment Recommendations  None recommended by PT    Recommendations for Other Services OT consult     Precautions / Restrictions Precautions Precautions: Posterior Hip;Fall Restrictions Weight Bearing Restrictions: Yes LLE Weight Bearing: Partial weight bearing      Mobility  Bed Mobility Overal bed mobility: Needs Assistance Bed Mobility: Sit to Supine;Supine to Sit     Supine to sit: Max  assist;HOB elevated Sit to supine: Max assist   General bed mobility comments: patient with decreased initiation. Requiring max assist for bed mobility    Transfers                 General transfer comment: unable at this time  Ambulation/Gait             General Gait Details: unable at this time  Stairs            Wheelchair Mobility    Modified Rankin (Stroke Patients Only)       Balance Overall balance assessment: Needs assistance   Sitting balance-Leahy Scale: Poor Sitting balance - Comments: unable to get sitting independently at edge of bed. Required max assist for sitting balance. Leaning onto his right side. Postural control: Right lateral lean                                   Pertinent Vitals/Pain Pain Assessment: Faces Faces Pain Scale: Hurts even more Pain Location: L leg Pain Descriptors / Indicators: Aching;Discomfort;Sore;Grimacing;Guarding;Moaning Pain Intervention(s): Limited activity within patient's tolerance;Monitored during session;Repositioned;Premedicated before session    Home Living Family/patient expects to be discharged to:: Skilled nursing facility Living Arrangements: Children               Additional Comments: patient's daughter reports he does sponge bathing    Prior Function Level of Independence: Needs assistance   Gait / Transfers Assistance Needed: uses walker at baseline  ADL's / Homemaking Assistance Needed: pt reports his family was assisting with ADL;pt is sponge bathing due to fear of falling        Hand Dominance   Dominant Hand: Right    Extremity/Trunk Assessment   Upper Extremity Assessment Upper Extremity Assessment: Generalized weakness    Lower Extremity Assessment Lower Extremity Assessment: LLE deficits/detail LLE Sensation: WNL LLE Coordination: decreased gross motor    Cervical / Trunk Assessment Cervical / Trunk Assessment: Normal  Communication    Communication: Expressive difficulties  Cognition Arousal/Alertness: Awake/alert Behavior During Therapy: WFL for tasks assessed/performed Overall Cognitive Status: Impaired/Different from baseline Area of Impairment: Orientation;Awareness;Following commands                 Orientation Level: Disoriented to;Place     Following Commands: Follows one step commands inconsistently;Follows one step commands with increased time              General Comments      Exercises Total Joint Exercises Ankle Circles/Pumps: AAROM;Left;10 reps   Assessment/Plan    PT Assessment Patient needs continued PT services  PT Problem List Decreased strength;Decreased mobility;Decreased activity tolerance;Decreased balance;Pain;Decreased knowledge of precautions       PT Treatment Interventions DME instruction;Therapeutic activities;Gait training;Therapeutic exercise;Patient/family education;Balance training;Functional mobility training    PT Goals (Current goals can be found in the Care Plan section)  Acute Rehab PT Goals Patient Stated Goal: to go to rehab PT Goal Formulation: With patient/family Time For Goal Achievement: 09/18/20 Potential to Achieve Goals: Good    Frequency 7X/week   Barriers to discharge   patient lives at home with his daughter.    Co-evaluation               AM-PAC PT "6 Clicks" Mobility  Outcome Measure Help needed turning from your back to your side while in a flat bed without using bedrails?: Total Help needed moving from lying on your back to sitting on the side of a flat bed without using bedrails?: Total Help needed moving to and from a bed to a chair (including a wheelchair)?: Total Help needed standing up from a chair using your arms (e.g., wheelchair or bedside chair)?: Total Help needed to walk in hospital room?: Total Help needed climbing 3-5 steps with a railing? : Total 6 Click Score: 6    End of Session Equipment Utilized During  Treatment: Oxygen Activity Tolerance: Patient limited by pain;Patient limited by fatigue Patient left: in bed;with bed alarm set;with call bell/phone within reach Nurse Communication: Mobility status PT Visit Diagnosis: Muscle weakness (generalized) (M62.81);Other abnormalities of gait and mobility (R26.89);History of falling (Z91.81);Pain Pain - Right/Left: Left Pain - part of body: Leg    Time: 1040-1054 PT Time Calculation (min) (ACUTE ONLY): 14 min   Charges:   PT Evaluation $PT Eval Moderate Complexity: 1 Mod          Kristyn Conetta, PT, GCS 09/04/20,11:57 AM

## 2020-09-04 NOTE — NC FL2 (Signed)
[place] LEVEL OF CARE SCREENING TOOL     IDENTIFICATION  Patient Name: Carl [name] Birthdate: October 26, 1935 Sex: male Admission Date (Current Location): 09/02/2020  [facility] [place] and [place] Number:  Engineering geologist and Address:         Provider Number: [id number]  Attending Physician Name and Address:  Enzo Bi, MD  Relative Name and Phone Number:       Current Level of Care: Hospital Recommended Level of Care: [place] Prior Approval Number:    Date Approved/Denied:   PASRR Number:    Discharge Plan: SNF    Current Diagnoses: Patient Active Problem List   Diagnosis Date Noted  . Closed left hip fracture ([place]) 09/02/2020  . Transaminitis 09/02/2020  . AF (paroxysmal atrial fibrillation) ([place]) 09/02/2020  . History of UTI 02/14/2020  . Constipation 02/14/2020  . Iron deficiency anemia 01/17/2020  . Near syncope   . Protein-calorie malnutrition, severe 12/22/2019  . Fall 12/19/2019  . Weight loss 08/30/2019  . Onychomycosis 08/30/2019  . AAA (abdominal aortic aneurysm) ([place]) 08/26/2018  . Kidney stones 06/30/2018  . Allergic rhinitis 04/26/2018  . Urolithiasis 01/05/2018  . Frequent urination 01/01/2018  . Colon polyps 09/15/2017  . GERD (gastroesophageal reflux disease) 09/15/2017  . Wrist pain, acute, right 05/25/2017  . Low back pain 03/24/2017  . Lung nodule seen on imaging study: 7 mm nodule left midlung 03/13/2017  . Physical deconditioning 03/13/2017  . Normocytic anemia 03/13/2017  . Fall at home, initial encounter 03/12/2017  . Abdominal soreness 03/12/2017  . Generalized weakness 03/12/2017  . Leukocytosis 03/12/2017  . Hyponatremia 03/12/2017  . Orthostatic hypotension 02/16/2017  . Anticoagulated 09/19/2016  . Prostate CA ([place]) 09/04/2016  . Persistent atrial fibrillation 09/04/2016  . Cardiomyopathy, ischemic 07/16/2012  . Chronic kidney disease (CKD), stage III (moderate) ([place]) 07/16/2012  . Essential  hypertension 08/05/2011  . Murmur 08/05/2011  . 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 Dyslipidemia 02/04/2011    Orientation RESPIRATION BLADDER Height & Weight     Self,Place  Normal Incontinent,External catheter Weight: 73.1 kg Height:  5\' 8"  (172.7 cm)  BEHAVIORAL SYMPTOMS/MOOD NEUROLOGICAL BOWEL NUTRITION STATUS      Continent Diet (Heart Healthy)  AMBULATORY STATUS COMMUNICATION OF NEEDS Skin   Extensive Assist Verbally Surgical wounds (Hemovac)                       Personal Care Assistance Level of Assistance              Functional Limitations Info             SPECIAL CARE FACTORS FREQUENCY  OT (By licensed OT),PT (By licensed PT)                    Contractures Contractures Info: Not present    Additional Factors Info  Code Status,Allergies Code Status Info: DNR Allergies Info: Ciprofloxacin, Ibuprofen           Current Medications (09/04/2020):  This is the current hospital active medication list Current Facility-Administered Medications  Medication Dose Route Frequency Provider Last Rate Last Admin  . 0.45 % sodium chloride infusion   Intravenous Continuous Earnestine Leys, MD 75 mL/hr at 09/04/20 0640 New Bag at 09/04/20 0640  . acetaminophen (TYLENOL) tablet 325-650 mg  325-650 mg Oral Q6H PRN Earnestine Leys, MD      . alum & mag hydroxide-simeth (MAALOX/MYLANTA) 200-200-20 MG/5ML suspension 30 mL  30  mL Oral Q4H PRN Earnestine Leys, MD      . amiodarone (PACERONE) tablet 200 mg  200 mg Oral Daily Earnestine Leys, MD   200 mg at 09/04/20 1003  . bisacodyl (DULCOLAX) suppository 10 mg  10 mg Rectal Daily PRN Earnestine Leys, MD      . Chlorhexidine Gluconate Cloth 2 % PADS 6 each  6 each Topical Daily Enzo Bi, MD      . darolutamide [place] [facility]ard) tablet 600 mg  600 mg Oral BID Earnestine Leys, MD      . docusate sodium (COLACE) capsule 100 mg  100 mg Oral BID Earnestine Leys, MD   100 mg at 09/04/20 1003  . [START ON 09/05/2020] enoxaparin (LOVENOX)  injection 40 mg  40 mg Subcutaneous Q24H Oswald Hillock, RPH      . feeding supplement (ENSURE ENLIVE / ENSURE PLUS) liquid 237 mL  237 mL Oral BID BM Enzo Bi, MD   237 mL at 09/04/20 1004  . ferrous sulfate tablet 325 mg  325 mg Oral Q breakfast Earnestine Leys, MD   325 mg at 09/04/20 1003  . HYDROcodone-acetaminophen (NORCO/VICODIN) 5-325 MG per tablet 1 tablet  1 tablet Oral Q4H PRN Earnestine Leys, MD   1 tablet at 09/04/20 1011  . influenza vaccine adjuvanted (FLUAD) injection 0.5 mL  0.5 mL Intramuscular Tomorrow-1000 Enzo Bi, MD      . lactose free nutrition (Boost) liquid 237 mL  237 mL Oral Q24H Earnestine Leys, MD      . menthol-cetylpyridinium (CEPACOL) lozenge 3 mg  1 lozenge Oral PRN Earnestine Leys, MD       Or  . phenol (CHLORASEPTIC) mouth spray 1 spray  1 spray Mouth/Throat PRN Earnestine Leys, MD      . methocarbamol (ROBAXIN) tablet 500 mg  500 mg Oral Q6H PRN Earnestine Leys, MD       Or  . methocarbamol (ROBAXIN) 500 mg in dextrose 5 % 50 mL IVPB  500 mg Intravenous Q6H PRN Earnestine Leys, MD      . metoCLOPramide (REGLAN) tablet 5-10 mg  5-10 mg Oral Q8H PRN Earnestine Leys, MD       Or  . metoCLOPramide (REGLAN) injection 5-10 mg  5-10 mg Intravenous Q8H PRN Earnestine Leys, MD      . morphine 2 MG/ML injection 0.5 mg  0.5 mg Intravenous Q2H PRN Agbata, Tochukwu, MD   0.5 mg at 09/03/20 0250  . morphine 2 MG/ML injection 0.5-1 mg  0.5-1 mg Intravenous Q2H PRN Earnestine Leys, MD   1 mg at 09/04/20 0604  . multivitamin with minerals tablet 1 tablet  1 tablet Oral Daily Enzo Bi, MD   1 tablet at 09/04/20 1003  . ondansetron (ZOFRAN) tablet 4 mg  4 mg Oral Q6H PRN Earnestine Leys, MD       Or  . ondansetron [facility]) injection 4 mg  4 mg Intravenous Q6H PRN Earnestine Leys, MD      . pantoprazole (PROTONIX) EC tablet 40 mg  40 mg Oral Daily Earnestine Leys, MD   40 mg at 09/04/20 1003  . polyethylene glycol (MIRALAX / GLYCOLAX) packet 17 g  17 g Oral Daily Earnestine Leys, MD   17  g at 09/04/20 1002  . senna (SENOKOT) tablet 8.6 mg  1 tablet Oral BID Earnestine Leys, MD   8.6 mg at 09/04/20 1003  . sodium phosphate (FLEET) 7-19 GM/118ML enema 1 enema  1 enema Rectal Once PRN Earnestine Leys, MD      .  tamsulosin (FLOMAX) capsule 0.4 mg  0.4 mg Oral QPC supper Earnestine Leys, MD   0.4 mg at 09/03/20 1815  . zolpidem (AMBIEN) tablet 5 mg  5 mg Oral QHS PRN Earnestine Leys, MD         Discharge Medications: Please see discharge summary for a list of discharge medications.  Relevant Imaging Results:  Relevant Lab Results:   Additional Information [id number]  Beverly Sessions, RN

## 2020-09-05 ENCOUNTER — Encounter: Payer: Self-pay | Admitting: Specialist

## 2020-09-05 DIAGNOSIS — N183 Chronic kidney disease, stage 3 unspecified: Secondary | ICD-10-CM

## 2020-09-05 DIAGNOSIS — C61 Malignant neoplasm of prostate: Secondary | ICD-10-CM

## 2020-09-05 DIAGNOSIS — I480 Paroxysmal atrial fibrillation: Secondary | ICD-10-CM

## 2020-09-05 DIAGNOSIS — S72002A Fracture of unspecified part of neck of left femur, initial encounter for closed fracture: Secondary | ICD-10-CM

## 2020-09-05 DIAGNOSIS — I10 Essential (primary) hypertension: Secondary | ICD-10-CM

## 2020-09-05 LAB — CBC
HCT: 24.1 % — ABNORMAL LOW (ref 39.0–52.0)
Hemoglobin: 8 g/dL — ABNORMAL LOW (ref 13.0–17.0)
MCH: 32.3 pg (ref 26.0–34.0)
MCHC: 33.2 g/dL (ref 30.0–36.0)
MCV: 97.2 fL (ref 80.0–100.0)
Platelets: 95 10*3/uL — ABNORMAL LOW (ref 150–400)
RBC: 2.48 MIL/uL — ABNORMAL LOW (ref 4.22–5.81)
RDW: 17.7 % — ABNORMAL HIGH (ref 11.5–15.5)
WBC: 7.8 10*3/uL (ref 4.0–10.5)
nRBC: 0 % (ref 0.0–0.2)

## 2020-09-05 LAB — BASIC METABOLIC PANEL
Anion gap: 8 (ref 5–15)
BUN: 29 mg/dL — ABNORMAL HIGH (ref 8–23)
CO2: 20 mmol/L — ABNORMAL LOW (ref 22–32)
Calcium: 7.9 mg/dL — ABNORMAL LOW (ref 8.9–10.3)
Chloride: 106 mmol/L (ref 98–111)
Creatinine, Ser: 1.2 mg/dL (ref 0.61–1.24)
GFR, Estimated: >60 mL/min (ref >60)
Glucose, Bld: 89 mg/dL (ref 70–99)
Potassium: 4.6 mmol/L (ref 3.5–5.1)
Sodium: 134 mmol/L — ABNORMAL LOW (ref 135–145)

## 2020-09-05 LAB — SURGICAL PATHOLOGY

## 2020-09-05 LAB — MAGNESIUM: Magnesium: 1.8 mg/dL (ref 1.7–2.4)

## 2020-09-05 MED ORDER — TRAMADOL HCL 50 MG PO TABS
50.0000 mg | ORAL_TABLET | Freq: Four times a day (QID) | ORAL | Status: DC | PRN
  Administered 2020-09-07: 50 mg via ORAL
  Filled 2020-09-05: qty 1

## 2020-09-05 MED ORDER — HYDROCODONE-ACETAMINOPHEN 5-325 MG PO TABS
1.0000 | ORAL_TABLET | Freq: Four times a day (QID) | ORAL | Status: DC | PRN
  Administered 2020-09-05 – 2020-09-06 (×2): 1 via ORAL
  Filled 2020-09-05 (×2): qty 1

## 2020-09-05 MED ORDER — MORPHINE SULFATE (PF) 2 MG/ML IV SOLN: 0.5000 mg | INTRAVENOUS | Status: DC | PRN

## 2020-09-05 NOTE — Evaluation (Signed)
Clinical/Bedside Swallow Evaluation Patient Details  Name: Carl [name] MRN: [id number] Date of Birth: [birth year]  Today's Date: 09/05/2020 Time: SLP Start Time (ACUTE ONLY): 82 SLP Stop Time (ACUTE ONLY): 1640 SLP Time Calculation (min) (ACUTE ONLY): 60 min  Past Medical History:  Past Medical History:  Diagnosis Date  . (HFpEF) heart failure with preserved ejection fraction ([place])    a. 08/2016 Echo: EF 50-55%.  [name] AAA (abdominal aortic aneurysm) ([place]) 06/05/2019   abdominal aortic aneurysm measuring 3.1 cm.  . Arthritis    arms  . Bladder calculus   . Chronic anemia   . Chronic anxiety   . CKD (chronic kidney disease), stage III ([place])   . Coronary artery disease CARDIOLOGIST-  DR Rockey Situ   a. CABG 2006 with  LIMA-LAD, SVG-Ramus, SVG-OM, seq SVG-PLV-PDA.  . DOE (dyspnea on exertion)   . Frequent falls 11/2019  . GERD (gastroesophageal reflux disease)   . Heart murmur   . Hematuria   . Hip fracture ([place]) 10/05/2018  . History of bladder stone   . History of colon polyps   . History of non-ST elevation myocardial infarction (NSTEMI) 03/30/2005   post op recovery inguinal hernia repair  . History of pressure ulcer   . History of recurrent UTIs    last admission 08-04-2018 pseudomonas UTI  . Hyperlipidemia   . Hyperplasia of prostate without lower urinary tract symptoms (LUTS)   . Hypertension   . Indwelling Foley catheter present   . Intracranial hematoma ([place]) 10/05/2018  . Ischemic cardiomyopathy    a. echo (01/13) 45-50% septal akinesis, mild MR; b. Echo  (01/16) ef 60-65%, mid-apical anteroseptal hypokinesis; c. echo 09-16-2016 ef 50-55%  . Left ureteral stone   . Lung nodule    noted 08/ 2018;  followed by pcp  . LVH (left ventricular hypertrophy) 09/16/2016   Moderate noted on ECHO  . Moderate mitral regurgitation    a.08/2016 Echo: Mod MR/TR.  [name] Orthostatic hypotension   . PAC (premature atrial contraction)   . Paroxysmal atrial flutter ([place])    a. dx 08/2016,  started on amiodarone.  . Persistent atrial fibrillation [facility]) followed by cardiologist-- dr Rockey Situ   a. dx AFib 08/2016 s/p DCCV 10/2016-->eliquis (CHA2DS2VASc = 5).  . Pneumonia   . Pulmonary hypertension ([place])    last echo in epic 09-16-2016  . Recurrent prostate cancer ([place]) UROLOGIST-- DR BELL   s/p  radioactive prostate seed implants in 1997;    due to PSA rising pt has been doing lupron injection's few past several yrs at dr bell office  . Renal calculi    bilateral per CT 06-18-2018  . S/P CABG x 5 04/04/2005   LIMA to LAD,  SVG to Ramus,  SVG to OM,  seqSVG to PLV and PDA  . Wears glasses    Past Surgical History:  Past Surgical History:  Procedure Laterality Date  . CARDIAC CATHETERIZATION  04/02/2005   dr Doreatha Lew    Critical three-vessel coronary disease --  Essentially normal left ventricular function , ef 55%  . CARDIOVASCULAR STRESS TEST  05-01-2008  dr Doreatha Lew /  dr harding   Low risk nuclear study w/ no ischemia or scar but flattened septal motion/  ef 64%  . CARDIOVERSION N/A 11/19/2016   Procedure: CARDIOVERSION;  Surgeon: Jerline Pain, MD;  Location: [facility];  Service: Cardiovascular;  Laterality: N/A;  . COLONOSCOPY N/A 10/23/2016   Procedure: COLONOSCOPY;  Surgeon: Milus Banister, MD;  Location: [facility];  Service: Endoscopy;  Laterality: N/A;  . CORONARY ARTERY BYPASS GRAFT  04/04/2005   dr  Ricard Dillon   LIMA - LAD,  SVG - Ramus, SVG - OM,  seqSVG - PLV and PDA  . CYSTOSCOPY WITH LITHOLAPAXY N/A 05/07/2016   Procedure: CYSTOSCOPY WITH LITHOLAPAXY;  Surgeon: Rana Snare, MD;  Location: [facility];  Service: Urology;  Laterality: N/A;  . CYSTOSCOPY WITH RETROGRADE PYELOGRAM, URETEROSCOPY AND STENT PLACEMENT  09/03/2018   Procedure: CYSTOSCOPY WITH RETROGRADE PYELOGRAM, URETEROSCOPY LASER LITHOTRIPSY AND STENT PLACEMENT;  Surgeon: Lucas Mallow, MD;  Location: [facility];  Service: Urology;;  . CYSTOSCOPY/URETEROSCOPY/HOLMIUM LASER/STENT PLACEMENT  Right 06/15/2019   Procedure: CYSTOSCOPY RIGHT URETEROSCOPY HOLMIUM LASER STENT PLACEMENT;  Surgeon: Lucas Mallow, MD;  Location: [facility];  Service: Urology;  Laterality: Right;  . HIP ARTHROPLASTY Left 09/03/2020   Procedure: ARTHROPLASTY BIPOLAR HIP (HEMIARTHROPLASTY);  Surgeon: Earnestine Leys, MD;  Location: [facility];  Service: Orthopedics;  Laterality: Left;  . HOLMIUM LASER APPLICATION N/A 40/98/1191   Procedure: HOLMIUM LASER APPLICATION;  Surgeon: Rana Snare, MD;  Location: [facility];  Service: Urology;  Laterality: N/A;  . INGUINAL HERNIA REPAIR Left 03-30-2005  dr Excell Seltzer   incarcerated   . RADIOACTIVE PROSTATE SEED IMPLANTS  1997  . TOTAL HIP ARTHROPLASTY Right 10/07/2018   Procedure: TOTAL HIP ARTHROPLASTY ANTERIOR APPROACH;  Surgeon: Rod Can, MD;  Location: [facility];  Service: Orthopedics;  Laterality: Right;  . TRANSTHORACIC ECHOCARDIOGRAM  08/24/2014   mid anteroseptal and distal septak hypokinetic,  mild focal basal LVH, ef 60-65%,  grade 1 diastolic dysfunction/  mild MR/  mild LAE/  mild dilated RV with normal RVSP/  trivial TR   HPI:  [name] with medical history significant for paroxysmal atrial fibrillation on anticoagulation therapy with Eliquis, chronic diastolic dysfunction CHF, abdominal aortic aneurysm, stage III chronic kidney disease and coronary artery disease who presents to the ER via EMS for evaluation following a fall at home.  Patient states that he tripped and fell landing on his left side.  He denied having any dizziness or lightheadedness prior to the fall and denied any loss of consciousness.  He hit his head and upon presentation to the ER complained of neck pain and pain in his left hip. Patient was unable to bear weight on the left leg due to pain. He is currently S/P L hemiarthroplasty.  Unsure of pt's Baseline Cognitive status prior to fall. Pt was being followed by [place] per chart  notes.  CXR was clear at admit.   Assessment / Plan / Recommendation Clinical Impression  Pt appears to present w/ oropharyngeal phase dysphagia in light of Significantly declined Cognitive status w/ agitation/confusion. Pt was distracted and often refused po trials as he yelled out for others. Much cueing given to calm and attend to po's offered. This behavior impacts his overall awareness/engagement and safety during po tasks which increases risk for aspiration, choking. Unsure of pt's full [place] Cognitive status prior to this admit. Pt's risk for aspiration can be reduced when following general aspiration precautions, using a modified consistency diet, and when given feeding assistance. He requires Mod-Max tactile/verbal/ visual cues for orientation to bolus presentation, and during feeding support(he often maintained closed eyes and muttered speech until he began yelling out). Pt consumed only few trials of ice chips, Nectar liquids, and purees w/ No immediate, overt clinical s/s of aspiration noted; no decline in vocal quality  during few verbalizations, no cough, and no decline in respiratory status during/post trials apparent. Oral phase was adequate for bolus management and oral clearing of the boluses given. He often turned his head away when not agreeable to taking any further po's. Thin liquids were not assessed d/t declined Cognitive status and risk for aspiration at this time. OM Exam was cursory/observation, but No overt unilateral weakness noted - less lingual movement at times often laying in floor of mouth during muttered speech. Pt appeared confused w/ attempted oral care then refused.    D/t pt's declined Cognitive status, and his risk for aspiration, recommend a Dysphagia level 1(puree) w/ Nectar consistency liquids; aspiration precautions; reduce Distractions during meals and only give po's when pt is calm and participative. Pills Crushed in Puree for safer swallowing. Support w/ feeding  at meals -- check for oral clearing during/post intake. NSG updated. Precautions posted in room. ST services recommends follow w/ Palliative Care for [place] as he was being followed by [place] services just prior to admit. Dietician f/u for nutritional needs, support. ST services will follow pt during this admit for ongoing assessment for readiness for any upgrade of diet if appropriate for pt. Largely suspect that pt's Cognitive decline and behavior could continue to hamper oral intake and upgrade of diet. SLP Visit Diagnosis: Dysphagia, oropharyngeal phase (R13.12) (Cognitive decline)    Aspiration Risk  Moderate aspiration risk;Risk for inadequate nutrition/hydration    Diet Recommendation  Dysphagia level 1 (puree) w/ gravies added to moisten; Nectar consistency liquids. Aspiration precautions; feeding support at meals; reduce distractions at mealtimes.  Medication Administration: Crushed with puree (for safer swallowing)    Other  Recommendations Recommended Consults:  (Dietician f/u; Palliative Care) Oral Care Recommendations: Oral care BID;Oral care before and after PO;Staff/trained caregiver to provide oral care Other Recommendations: Order thickener from pharmacy;Prohibited food (jello, ice cream, thin soups);Have oral suction available;Remove water pitcher   Follow up Recommendations Skilled Nursing facility (TBD)      Frequency and Duration min 3x week  2 weeks       Prognosis Prognosis for Safe Diet Advancement: Fair Barriers to Reach Goals: Cognitive deficits;Time post onset;Severity of deficits;Behavior      Swallow Study   General Date of Onset: 09/02/20 HPI: [name] with medical history significant for paroxysmal atrial fibrillation on anticoagulation therapy with Eliquis, chronic diastolic dysfunction CHF, abdominal aortic aneurysm, stage III chronic kidney disease and coronary artery disease who presents to the ER via EMS for evaluation following a  fall at home.  Patient states that he tripped and fell landing on his left side.  He denied having any dizziness or lightheadedness prior to the fall and denied any loss of consciousness.  He hit his head and upon presentation to the ER complained of neck pain and pain in his left hip. Patient was unable to bear weight on the left leg due to pain. He is currently S/P L hemiarthroplasty.  Unsure of pt's Baseline Cognitive status prior to fall. Pt was being followed by [place] per chart notes.  CXR was clear at admit. Type of Study: Bedside Swallow Evaluation Previous Swallow Assessment: none Diet Prior to this Study: Thin liquids (Full Liquids s/p surgery) Temperature Spikes Noted: No (wbc 7.8) Respiratory Status: Nasal cannula (2L) History of Recent Intubation: No Behavior/Cognition: Alert;Confused;Agitated;Distractible;Requires cueing;Doesn't follow directions;Uncooperative Oral Cavity Assessment: Dry Oral Care Completed by SLP: No (pt wouldn't allow) Oral Cavity - Dentition: Poor  condition;Missing dentition Vision:  (n/a) Self-Feeding Abilities: Total assist Patient Positioning: Upright in bed (difficult to position pt upright in bed d/t Cognition) Baseline Vocal Quality: Normal (muttered speech) Volitional Cough: Cognitively unable to elicit Volitional Swallow: Unable to elicit    Oral/Motor/Sensory Function Overall Oral Motor/Sensory Function: Generalized oral weakness (lingual movement decreased in general) Facial Symmetry: Within Functional Limits Lingual Symmetry: Within Functional Limits   Ice Chips Ice chips: Within functional limits Presentation: Spoon (fed; 3 trials) Other Comments: pt sucked hard on the ice chips   Thin Liquid Thin Liquid: Not tested    Nectar Thick Nectar Thick Liquid: Within functional limits Presentation: Straw (fed; ~4 ozs total) Other Comments: nectar tea, Nepro   Honey Thick Honey Thick Liquid: Not tested   Puree  Puree: Within functional limits Presentation: Spoon (fed; 4 tirals accepted)   Solid     Solid: Not tested       Orinda Kenner, MS, [place] Speech Language Pathologist Rehab Services [phone] [facility] 09/05/2020,5:23 PM

## 2020-09-05 NOTE — Care Management Important Message (Signed)
Important Message  Patient Details  Name: Carl [name] MRN: [id number] Date of Birth: 1935/08/31   Medicare Important Message Given:  N/A - LOS <3 / Initial given by admissions     [name] 09/05/2020, 8:15 AM

## 2020-09-05 NOTE — Progress Notes (Signed)
[place] [facility] [place] NAME: Carl [name]    MR#:  [id number]  DATE OF BIRTH:  Jul 03, 1936  SUBJECTIVE:   Complains of hip pain. Has been having periods of hallucination. Not able to hold meaningful conversation. REVIEW OF SYSTEMS:   Review of Systems  Unable to perform ROS: Mental acuity   Tolerating Diet:very little Tolerating PT: Rehab  DRUG ALLERGIES:   Allergies  Allergen Reactions  . Ciprofloxacin Other (See Comments)    PER HEART DR  . Ibuprofen Other (See Comments)    Kidney issues    VITALS:  Blood pressure (!) 108/50, pulse 93, temperature 98.5 F (36.9 C), temperature source Oral, resp. rate 16, height 5\' 8"  (1.727 m), weight 73.1 kg, SpO2 95 %.  PHYSICAL EXAMINATION:   Physical ExamLimited due to confusion  GENERAL:  85 y.o.-year-old patient lying in the bed with no acute distress.  LUNGS: Normal breath sounds bilaterally, no wheezing, rales, rhonchi. No use of accessory muscles of respiration.  CARDIOVASCULAR: S1, S2 normal. No murmurs, rubs, or gallops.irregular rhythm  ABDOMEN: Soft, nontender, nondistended. Bowel sounds present. No organomegaly or mass.  EXTREMITIES: No cyanosis, clubbing or edema b/l.   Left hip surgical dressing present NEUROLOGIC: moves all extremities well. Generalized ability weakness PSYCHIATRIC:  patient is alert and confused.  SKIN: No obvious rash, lesion, or ulcer.   LABORATORY PANEL:  CBC Recent Labs  Lab 09/05/20 0442  WBC 7.8  HGB 8.0*  HCT 24.1*  PLT 95*    Chemistries  Recent Labs  Lab 09/02/20 0758 09/03/20 0503 09/05/20 0442  NA 136   < > 134*  K 5.0   < > 4.6  CL 107   < > 106  CO2 21*   < > 20*  GLUCOSE 118*   < > 89  BUN 25*   < > 29*  CREATININE 1.48*   < > 1.20  CALCIUM 8.4*   < > 7.9*  MG  --    < > 1.8  AST 135*  --   --   ALT 141*  --   --   ALKPHOS 80  --   --   BILITOT 0.7  --   --    < > = values in this interval not displayed.   Cardiac  Enzymes No results for input(s): TROPONINI in the last 168 hours. RADIOLOGY:  No results found. ASSESSMENT AND PLAN:   Carl Mairena Hamiltonis a 85 y.o.malewith medical history significant forparoxysmal atrial fibrillation on anticoagulation therapy with Eliquis, chronic diastolic dysfunction CHF, abdominal aortic aneurysm, stage III chronic kidney disease and coronary artery disease s/p CABG who presented to the ER via EMS for evaluation following a fall at home. Patient stated that he tripped and fell landing on his left side. He denies having any dizziness or lightheadedness prior to the fall and denies any loss of consciousness. He hit his head and upon presentation to the ER complained of neck pain and pain in his left hip.    Closed left hip fracture S/p left HEMIARTHROPLASTY on 2/7--Dr Sabra Heck Following a mechanical fall at home --found to have amoderately displaced proximal left femoral neck fracture. --pain control --PT eval --recs Rehab --D/C IV fluids --Partial weightbearing left leg --Return to clinic in 2 weeks for x-rays and staple removal  Post-op hypotension, improved ---systolic stable 242'A today  Hospital delirium --pt has been confused, hallucinating. --Seroquel nightly   History of paroxysmal atrial fibrillation on Eliquis  Patient is currently in sinus rhythm and is rate controlled on amiodarone --cont home amiodarone --resume Eliquis tomorrow, per ortho --pt is in chronic afib--d/c tele. HR 80-90  History of prostate cancer --cont home darolutamide  stage 2 chronic kidney disease Stable  Acute on chronic anemia, due to blood loss --Hgb 8-9's. --needed 3u pRBC total during and after surgery. Plan: --monitor and transfuse to keep Hgb >7  Thrombocytopenia --monitor for now   DVT prophylaxis: [id number] Code Status: DNR  Family Communication: spoke with  dter Annetter on the phone Status is: inpatient Dispo:   The patient is from:  home Anticipated d/c is to: SNF rehab Anticipated d/c date is: when rehab bed available Patient currently is medically improving but has some lingering confusion/ delirium  Level of care: Med-Surg        TOTAL TIME TAKING CARE OF THIS PATIENT: *25* minutes.  >50% time spent on counselling and coordination of care  Note: This dictation was prepared with Dragon dictation along with smaller phrase technology. Any transcriptional errors that result from this process are unintentional.  Fritzi Mandes M.D    [facility]   CC: Primary care physician; [name], MDPatient ID: Carl [name], male   DOB: [birth year], 85 y.o.   MRN: [id number]

## 2020-09-05 NOTE — Progress Notes (Signed)
Subjective: 2 Days Post-Op Procedure(s) (LRB): ARTHROPLASTY BIPOLAR HIP (HEMIARTHROPLASTY) (Left)   Patient is sleepy today. Is arousable. Left leg is stable. Dressing is dry. Hemoglobin is 8.0.  Patient reports pain as mild.  Objective:   VITALS:   Vitals:   09/05/20 0859 09/05/20 1157  BP: (!) 98/57 (!) 108/50  Pulse: 86 93  Resp: 16 16  Temp: 98.3 F (36.8 C) 98.5 F (36.9 C)  SpO2: 96% 95%    Neurologically intact Sensation intact distally Incision: dressing C/D/I  LABS Recent Labs    09/03/20 1118 09/04/20 0532 09/05/20 0442  HGB 8.7* 8.9* 8.0*  HCT 27.3* 26.7* 24.1*  WBC 13.1* 12.2* 7.8  PLT 109* 105* 95*    Recent Labs    09/03/20 0503 09/03/20 1118 09/04/20 0532 09/05/20 0442  NA 138  --  134* 134*  K 4.9  --  4.8 4.6  BUN 23  --  31* 29*  CREATININE 1.29* 1.19 1.28* 1.20  GLUCOSE 97  --  114* 89    No results for input(s): LABPT, INR in the last 72 hours.   Assessment/Plan: 2 Days Post-Op Procedure(s) (LRB): ARTHROPLASTY BIPOLAR HIP (HEMIARTHROPLASTY) (Left)   Advance diet Up with therapy Discharge to SNF   Partial weightbearing left leg with walker  Restart Eliquis  Return to clinic 2 weeks for x-ray and staple removal

## 2020-09-05 NOTE — Progress Notes (Signed)
Physical Therapy Treatment Patient Details Name: Carl [name] MRN: [id number] DOB: 03-Mar-1936 Today's Date: 09/05/2020    History of Present Illness Carl [name] is a 85 y.o. male with medical history significant for paroxysmal atrial fibrillation on anticoagulation therapy with Eliquis, chronic diastolic dysfunction CHF, abdominal aortic aneurysm, stage III chronic kidney disease and coronary artery disease who presents to the ER via EMS for evaluation following a fall at home.  Patient states that he tripped and fell landing on his left side.  He denies having any dizziness or lightheadedness prior to the fall and denies any loss of consciousness.  He hit his head and upon presentation to the ER complained of neck pain and pain in his left hip.  He rates his left hip pain a 10 x 10 in intensity at its worst, nonradiating and worse with any form of movement.  Patient was unable to bear weight on the left leg due to pain. S/P L hemiarthroplasty.    PT Comments    Pt was asleep in long sitting with traction wrap on LLE. Did clarify with MD that he wants abduction pillow donned when pt is in bed. Pt is very confused. Even hallucinating at time. He requires total assist to initiate any movements to achieve EOB sitting. Attempted to educate pt on proper positioning and there ex however poor carryover.  Pt will need re-education in future sessions. Acute PT will continue to follow per POC.     Follow Up Recommendations  SNF;Supervision/Assistance - 24 hour;Supervision for mobility/OOB     Equipment Recommendations  Other (comment) (defer to next level of care)    Recommendations for Other Services       Precautions / Restrictions Precautions Precautions: Posterior Hip;Fall Precaution Booklet Issued: No Precaution Comments: hip abduction pillow Restrictions Weight Bearing Restrictions: Yes LLE Weight Bearing: Partial weight bearing    Mobility  Bed Mobility Overal bed mobility:  Needs Assistance Bed Mobility: Sit to Supine;Supine to Sit     Supine to sit: Max assist;HOB elevated;Total assist Sit to supine: Total assist      Transfers      General transfer comment: unable to safely progress due to pt's poor cognition     Balance Overall balance assessment: Needs assistance   Sitting balance-Leahy Scale: Poor Sitting balance - Comments: max assist to maintain sitting EOB       Cognition Arousal/Alertness: Lethargic;Suspect due to medications Behavior During Therapy: Impulsive;Anxious Overall Cognitive Status: Impaired/Different from baseline Area of Impairment: Orientation;Attention;Memory;Following commands;Awareness;Safety/judgement;Problem solving                 Orientation Level: Disoriented to;Place;Time;Situation Current Attention Level: Divided Memory: Decreased recall of precautions;Decreased short-term memory Following Commands: Follows one step commands inconsistently;Follows one step commands with increased time Safety/Judgement: Decreased awareness of safety;Decreased awareness of deficits   Problem Solving: Slow processing;Decreased initiation;Difficulty sequencing;Requires verbal cues;Requires tactile cues General Comments: pt oriented to self.  poor insight to deficits and requires extensive physical assistance throughout             Pertinent Vitals/Pain Pain Assessment: Faces Faces Pain Scale: Hurts whole lot Pain Location: L leg/ neck Pain Descriptors / Indicators: Aching;Discomfort;Sore;Grimacing;Guarding;Moaning Pain Intervention(s): Limited activity within patient's tolerance;Monitored during session;Premedicated before session           PT Goals (current goals can now be found in the care plan section) Progress towards PT goals: Not progressing toward goals - comment (cognition/pain/lethargy)    Frequency    7X/week  PT Plan Current plan remains appropriate       AM-PAC PT "6 Clicks" Mobility    Outcome Measure  Help needed turning from your back to your side while in a flat bed without using bedrails?: Total Help needed moving from lying on your back to sitting on the side of a flat bed without using bedrails?: Total Help needed moving to and from a bed to a chair (including a wheelchair)?: Total Help needed standing up from a chair using your arms (e.g., wheelchair or bedside chair)?: Total Help needed to walk in hospital room?: Total Help needed climbing 3-5 steps with a railing? : Total 6 Click Score: 6    End of Session Equipment Utilized During Treatment: Oxygen Activity Tolerance: Patient limited by pain Patient left: in bed;with bed alarm set;with call bell/phone within reach Nurse Communication: Mobility status PT Visit Diagnosis: Muscle weakness (generalized) (M62.81);Other abnormalities of gait and mobility (R26.89);History of falling (Z91.81);Pain Pain - Right/Left: Left Pain - part of body: Leg     Time: 7793-9030 PT Time Calculation (min) (ACUTE ONLY): 25 min  Charges:  $Therapeutic Activity: 23-37 mins                     Julaine Fusi PTA 09/05/20, 1:14 PM

## 2020-09-06 LAB — MAGNESIUM: Magnesium: 2 mg/dL (ref 1.7–2.4)

## 2020-09-06 MED ORDER — CARBAMIDE PEROXIDE 6.5 % OT SOLN
5.0000 [drp] | Freq: Two times a day (BID) | OTIC | Status: DC
  Administered 2020-09-06 – 2020-09-07 (×3): 5 [drp] via OTIC
  Filled 2020-09-06: qty 15

## 2020-09-06 MED ORDER — NEPRO/CARBSTEADY PO LIQD
237.0000 mL | Freq: Three times a day (TID) | ORAL | Status: DC
  Administered 2020-09-06 – 2020-09-07 (×2): 237 mL via ORAL

## 2020-09-06 NOTE — Progress Notes (Signed)
[facility] [place] NAME: Carl [name]    MR#:  [id number]  DATE OF BIRTH:  Aug 14, 1935  SUBJECTIVE:   Complains of hip pain. Has been having periods of hallucination. Not able to hold meaningful conversation. Very sleepy today REVIEW OF SYSTEMS:   Review of Systems  Unable to perform ROS: Mental acuity   Tolerating Diet:very little Tolerating PT: Rehab  DRUG ALLERGIES:   Allergies  Allergen Reactions  . Ciprofloxacin Other (See Comments)    PER HEART DR  . Ibuprofen Other (See Comments)    Kidney issues    VITALS:  Blood pressure 108/72, pulse 91, temperature 98.6 F (37 C), resp. rate 18, height 5\' 8"  (1.727 m), weight 73.1 kg, SpO2 99 %.  PHYSICAL EXAMINATION:   Physical ExamLimited due to confusion  GENERAL:  85 y.o.-year-old patient lying in the bed with no acute distress.  LUNGS: Normal breath sounds bilaterally, no wheezing, rales, rhonchi. No use of accessory muscles of respiration.  CARDIOVASCULAR: S1, S2 normal. No murmurs, rubs, or gallops.irregular rhythm  ABDOMEN: Soft, nontender, nondistended. Bowel sounds present. No organomegaly or mass.  EXTREMITIES: No cyanosis, clubbing or edema b/l.   Left hip surgical dressing present NEUROLOGIC: moves all extremities well. Generalized ability weakness PSYCHIATRIC:  patient is more sleepy today  SKIN: No obvious rash, lesion, or ulcer.   LABORATORY PANEL:  CBC Recent Labs  Lab 09/05/20 0442  WBC 7.8  HGB 8.0*  HCT 24.1*  PLT 95*    Chemistries  Recent Labs  Lab 09/02/20 0758 09/03/20 0503 09/05/20 0442 09/06/20 0631  NA 136   < > 134*  --   K 5.0   < > 4.6  --   CL 107   < > 106  --   CO2 21*   < > 20*  --   GLUCOSE 118*   < > 89  --   BUN 25*   < > 29*  --   CREATININE 1.48*   < > 1.20  --   CALCIUM 8.4*   < > 7.9*  --   MG  --    < > 1.8 2.0  AST 135*  --   --   --   ALT 141*  --   --   --   ALKPHOS 80  --   --   --   BILITOT 0.7  --   --    --    < > = values in this interval not displayed.   Cardiac Enzymes No results for input(s): TROPONINI in the last 168 hours. RADIOLOGY:  No results found. ASSESSMENT AND PLAN:   Carl Abdallah Hamiltonis a 85 y.o.malewith medical history significant forparoxysmal atrial fibrillation on anticoagulation therapy with Eliquis, chronic diastolic dysfunction CHF, abdominal aortic aneurysm, stage III chronic kidney disease and coronary artery disease s/p CABG who presented to the ER via EMS for evaluation following a fall at home. Patient stated that he tripped and fell landing on his left side. He denies having any dizziness or lightheadedness prior to the fall and denies any loss of consciousness. He hit his head and upon presentation to the ER complained of neck pain and pain in his left hip.    Closed left hip fracture S/p left HEMIARTHROPLASTY on 2/7--Dr Sabra Heck Following a mechanical fall at home --found to have amoderately displaced proximal left femoral neck fracture. --pain control --PT eval --recs Rehab --D/C IV fluids --Partial weightbearing left leg --  Return to clinic in 2 weeks for x-rays and staple removal  Post-op hypotension, improved ---systolic stable 005'R today  Hospital delirium --more calm today but sleepy. Going to hold seroquel  History of paroxysmal atrial fibrillation on Eliquis Patient is currently in sinus rhythm and is rate controlled on amiodarone --cont home amiodarone --resume Eliquis tomorrow, per ortho --pt is in chronic afib--d/c tele. HR 80-90  History of prostate cancer --cont home darolutamide  stage 2 chronic kidney disease Stable  Acute on chronic anemia, due to blood loss --Hgb 8-9's. --needed 3u pRBC total during and after surgery. Plan: --monitor and transfuse to keep Hgb >7  Thrombocytopenia --monitor for now   DVT prophylaxis: TM:YTRZNBV Code Status: DNR  Family Communication: left message for  dter Annetter  today Status is: inpatient Dispo:   The patient is from: home Anticipated d/c is to: SNF rehab Anticipated d/c date is: 2/11 Patient currently is medically improving but has some lingering confusion/ delirium  Level of care: Med-Surg   Repeat covid ordered     TOTAL TIME TAKING CARE OF THIS PATIENT: *25* minutes.  >50% time spent on counselling and coordination of care  Note: This dictation was prepared with Dragon dictation along with smaller phrase technology. Any transcriptional errors that result from this process are unintentional.  Fritzi Mandes M.D    [facility]   CC: Primary care physician; [name], MDPatient ID: Carl [name], male   DOB: 06-29-1936, 85 y.o.   MRN: [id number]

## 2020-09-06 NOTE — Progress Notes (Signed)
  Speech Language Pathology Treatment: Dysphagia  Patient Details Name: Carl [name] MRN: [id number] DOB: Dec 01, 1935 Today's Date: 09/06/2020 Time: 2585-2778 SLP Time Calculation (min) (ACUTE ONLY): 20 min  Assessment / Plan / Recommendation Clinical Impression  Pt was seen at bedside for skilled ST intervention targeting assessment of diet tolerance and appropriateness to advance textures. Suction was set up at bedside to facilitate oral care - pt was sleeping soundly with open mouth breathing. Consequently, oral cavity was noted to be significantly dry. Oral care was completed with suction, following which pt accepted trials of individual ice chips. Extended oral prep/holding noted, followed by immediate wet cough after the swallow. Recommend continuing with thorough oral care before and after meals to minimize bacterial load. Will continue puree diet and nectar thick liquids to maximize swallow safety. Safe swallow precautions posted in pt room. SLP will continue to follow acutely to assess diet tolerance and provide ongoing education.    HPI HPI: [name] with medical history significant for paroxysmal atrial fibrillation on anticoagulation therapy with Eliquis, chronic diastolic dysfunction CHF, abdominal aortic aneurysm, stage III chronic kidney disease and coronary artery disease who presents to the ER via EMS for evaluation following a fall at home.  Patient states that he tripped and fell landing on his left side.  He denied having any dizziness or lightheadedness prior to the fall and denied any loss of consciousness.  He hit his head and upon presentation to the ER complained of neck pain and pain in his left hip. Patient was unable to bear weight on the left leg due to pain. He is currently S/P L hemiarthroplasty.  Unsure of pt's Baseline Cognitive status prior to fall. Pt was being followed by [facility] per chart notes.  CXR was clear at  admit.      SLP Plan  Continue with current plan of care       Recommendations  Diet recommendations: Dysphagia 1 (puree);Nectar-thick liquid Liquids provided via: Straw;Cup Medication Administration: Crushed with puree Supervision: Full supervision/cueing for compensatory strategies Compensations: Minimize environmental distractions;Slow rate;Small sips/bites Postural Changes and/or Swallow Maneuvers: Seated upright 90 degrees;Upright 30-60 min after meal                Oral Care Recommendations: Oral care BID;Oral care before and after PO;Staff/trained caregiver to provide oral care Follow up Recommendations: Skilled Nursing facility;24 hour supervision/assistance SLP Visit Diagnosis: Dysphagia, oropharyngeal phase (R13.12) Plan: Continue with current plan of care       [place] [facility], CCC-SLP Speech Language Pathologist [phone]  Shonna Chock 09/06/2020, 10:16 AM

## 2020-09-06 NOTE — Progress Notes (Addendum)
SLP Note  Patient Details Name: Carl [name] MRN: [id number] DOB: May 29, 1936   Call received from physical therapy regarding difficulty observed with nectar thick liquids this afternoon. Upon arrival of SLP, pt was sleeping and was insufficiently arousable for PO trials. Safe swallow precautions posted in pt room.   Recommend PO intake only when pt is fully awake, alert, and upright. Recommend cup sips (no straw) to maximize safety. SLP will continue to follow.   [name] [facility], CCC-SLP Speech Language Pathologist [phone]  Shonna Chock 09/06/2020, 2:21 PM

## 2020-09-06 NOTE — Progress Notes (Signed)
Aquacel applied to L hip. Pt tolerated without issue.

## 2020-09-06 NOTE — TOC Progression Note (Signed)
Transition of Care [facility]) - Progression Note    Patient Details  Name: Carl [name] MRN: [id number] Date of Birth: 06-12-1936  Transition of Care [facility]) CM/SW [place], RN Phone Number: 09/06/2020, 10:39 AM  Clinical Narrative:   RNCM reached out to both patient's daughter and son to discuss bed offer for [facility], both of patient's children are in agreement to accept bed. RNCM accepted bed in hub and reached out to [place] with [facility] to notify her. Per patient's daughter he only got one of the Covid vaccines and it was a while ago, she does not remember the details.  RNCM will start insurance authorization through [employer] portal.    Expected Discharge Plan: [place] Barriers to Discharge: Continued Medical Work up  Expected Discharge Plan and Services Expected Discharge Plan: [place]   Discharge Planning Services: CM Consult   Living arrangements for the past 2 months: Single Family Home                                       Social Determinants of Health (SDOH) Interventions    Readmission Risk Interventions Readmission Risk Prevention Plan 09/04/2020  Transportation Screening Complete  Social Work Consult for [place] Planning/Counseling Complete  Medication Review Press photographer) Complete  Some recent data might be hidden

## 2020-09-06 NOTE — Progress Notes (Signed)
Nutrition Follow-up  DOCUMENTATION CODES:   Severe malnutrition in context of chronic illness  INTERVENTION:  Discontinued Ensure Enlive as patient is now on nectar-thick liquids.  Provide Nepro Shake po TID, each supplement provides 425 kcal and 19 grams protein.  Provide Magic cup BID with lunch and dinner, each supplement provides 290 kcal and 9 grams of protein.  Continue MVI po daily.  Please document meal completion in flowsheets (Intake/Output)  NUTRITION DIAGNOSIS:   Severe Malnutrition related to chronic illness (CHF, CKD) as evidenced by severe fat depletion,severe muscle depletion.  New nutrition diagnosis.  GOAL:   Patient will meet greater than or equal to 90% of their needs  Unknown how progressing.  MONITOR:   PO intake,Supplement acceptance,Diet advancement,Labs,Weight trends,I & O's,Skin  REASON FOR ASSESSMENT:   Consult Assessment of nutrition requirement/status  ASSESSMENT:   85 year old male with PMHx of HTN, HLD, GERD, anxiety, CHF, CAD s/p CABG, ischemic cardiomyopathy, moderate mitral regurgitation, CKD stage III, arthritis, A-fib, recurrent UTIs admitted with displaced subcapital fracture of left hip.   2/7 s/p left hip hemiarthroplasty  Met with patient at bedside. Patient confused and wearing mitts. He is unable to provide any history. Only meal documentation available in chart is 100% of lunch on 2/8. No other documentation available. Unable to find RN while on floor. Attempted to call RN's phone and unable to reach. Sent secure chat to RN asking about patient's intake and did not get a reply message. Diet was downgraded to dysphagia 1 with nectar-thick liquids yesterday following SLP evaluation. Per board in room patient requires assistance with meals.  Medications reviewed and include: Colace 100 mg BID, ferrous sulfate 325 mg daily, MVI daily, Protonix, Miralax, senna 1 tablet BID.  Labs reviewed: CBG 115, Sodium 134, CO2 20, BUN  29.  I/O: 800 mL UOP yesterday + 1 occurrence unmeasured UOP yesterday  Weight trend: 73.1 kg on 2/8; +2.6 kg from 2/6 possibly related to edema found on NFPE  NUTRITION - FOCUSED PHYSICAL EXAM:  Flowsheet Row Most Recent Value  Orbital Region Severe depletion  Upper Arm Region Severe depletion  Thoracic and Lumbar Region Severe depletion  Buccal Region Severe depletion  Temple Region Severe depletion  Clavicle Bone Region Severe depletion  Clavicle and Acromion Bone Region Severe depletion  Scapular Bone Region Unable to assess  Dorsal Hand Severe depletion  Patellar Region Severe depletion  Anterior Thigh Region Severe depletion  Posterior Calf Region Severe depletion  Edema (RD Assessment) Mild  Hair Reviewed  Eyes Reviewed  Mouth Unable to assess  Skin Reviewed  Nails Reviewed     Diet Order:   Diet Order            DIET - DYS 1 Room service appropriate? Yes with Assist; Fluid consistency: Nectar Thick  Diet effective now                EDUCATION NEEDS:   Not appropriate for education at this time  Skin:  Skin Assessment: Skin Integrity Issues: Skin Integrity Issues:: Incisions Incisions: closed incision left hip  Last BM:  09/04/2020 per chart  Height:   Ht Readings from Last 1 Encounters:  09/02/20 $RemoveB'5\' 8"'[id number]$  (1.727 m)   Weight:   Wt Readings from Last 1 Encounters:  09/04/20 73.1 kg   BMI:  Body mass index is 24.5 kg/m.  Estimated Nutritional Needs:   Kcal:  1800-2000  Protein:  90-100 grams  Fluid:  1.8 L/day  Jacklynn Barnacle, MS, RD, LDN Pager  number available on Amion

## 2020-09-06 NOTE — Progress Notes (Signed)
Physical Therapy Treatment Patient Details Name: Carl [name] MRN: [id number] DOB: 10-07-1935 Today's Date: 09/06/2020    History of Present Illness Carl [name] is a 85 y.o. male with medical history significant for paroxysmal atrial fibrillation on anticoagulation therapy with Eliquis, chronic diastolic dysfunction CHF, abdominal aortic aneurysm, stage III chronic kidney disease and coronary artery disease who presents to the ER via EMS for evaluation following a fall at home.  Patient states that he tripped and fell landing on his left side.  He denies having any dizziness or lightheadedness prior to the fall and denies any loss of consciousness.  He hit his head and upon presentation to the ER complained of neck pain and pain in his left hip.  He rates his left hip pain a 10 x 10 in intensity at its worst, nonradiating and worse with any form of movement.  Patient was unable to bear weight on the left leg due to pain. S/P L hemiarthroplasty.    PT Comments    Pt long sitting in bed awake and requesting to get OOB. He presents with much improved cognition than previous date. No hallucinations however still not at baseline. He is unaware of deficits, restrictions or precautions. Was able to get to EOB short sit with max assist. Max assist to maintain sitting balance. Pt requested to try to stand. Stood 2 x but unsafely with max assist and max vcs . Total to return to supine. Pt will need STR at DC. Recommend DC to SNF to address deficits while improving pt's independence.     Follow Up Recommendations  SNF     Equipment Recommendations   (defer to next level of care)    Recommendations for Other Services       Precautions / Restrictions Precautions Precautions: Posterior Hip;Fall Precaution Comments: hip abduction pillow Restrictions Weight Bearing Restrictions: Yes LLE Weight Bearing: Partial weight bearing    Mobility  Bed Mobility Overal bed mobility: Needs  Assistance Bed Mobility: Supine to Sit;Sit to Supine     Supine to sit: Max assist Sit to supine: Max assist   General bed mobility comments: Increased time to perform. does required max vcs and assist to achieve EOB sitting    Transfers Overall transfer level: Needs assistance Equipment used: Rolling walker (2 wheeled) Transfers: Sit to/from Stand           General transfer comment: pt was able to stand 2 x EOB however unable to maintain proper wt bearing. did not elect to stand again due to inability to properly wt bear  Ambulation/Gait      General Gait Details: unsafe at this time       Balance Overall balance assessment: Needs assistance Sitting-balance support: Feet supported Sitting balance-Leahy Scale: Poor Sitting balance - Comments: Extreme R lateral lean 2/2 to pain   Standing balance support: Bilateral upper extremity supported;During functional activity Standing balance-Leahy Scale: Poor Standing balance comment: unable to maintain PWB       Cognition Arousal/Alertness: Awake/alert Behavior During Therapy: WFL for tasks assessed/performed (much less confused this date.) Overall Cognitive Status: Impaired/Different from baseline Area of Impairment: Orientation;Attention;Memory;Following commands;Awareness;Safety/judgement;Problem solving      Orientation Level: Disoriented to;Place;Time Current Attention Level: Focused Memory: Decreased recall of precautions;Decreased short-term memory Following Commands: Follows one step commands inconsistently Safety/Judgement: Decreased awareness of safety;Decreased awareness of deficits   Problem Solving: Slow processing;Decreased initiation;Difficulty sequencing;Requires verbal cues;Requires tactile cues General Comments: Pt is awake throughout session + presenting with much improved cognition.  still presenting with cognition different than that of baseline. was able to follow simple one step commands with  increased time             Pertinent Vitals/Pain Pain Assessment: 0-10 Pain Score: 10-Worst pain ever Pain Location: grimace with repositioning Pain Descriptors / Indicators: Grimacing Pain Intervention(s): Limited activity within patient's tolerance;Monitored during session;Premedicated before session;Repositioned           PT Goals (current goals can now be found in the care plan section) Acute Rehab PT Goals Patient Stated Goal: none stated Progress towards PT goals: Progressing toward goals    Frequency    7X/week      PT Plan Current plan remains appropriate       AM-PAC PT "6 Clicks" Mobility   Outcome Measure  Help needed turning from your back to your side while in a flat bed without using bedrails?: A Lot Help needed moving from lying on your back to sitting on the side of a flat bed without using bedrails?: A Lot Help needed moving to and from a bed to a chair (including a wheelchair)?: Total Help needed standing up from a chair using your arms (e.g., wheelchair or bedside chair)?: Total Help needed to walk in hospital room?: Total Help needed climbing 3-5 steps with a railing? : Total 6 Click Score: 8    End of Session   Activity Tolerance: Other (comment) (limited by cognition/ inability to properly maintain PWB) Patient left: in bed;with bed alarm set;with call bell/phone within reach Nurse Communication: Mobility status PT Visit Diagnosis: Muscle weakness (generalized) (M62.81);Other abnormalities of gait and mobility (R26.89);History of falling (Z91.81);Pain Pain - Right/Left: Left Pain - part of body: Leg     Time: 2010-0712 PT Time Calculation (min) (ACUTE ONLY): 25 min  Charges:  $Therapeutic Activity: 23-37 mins                     Julaine Fusi PTA 09/06/20, 4:41 PM

## 2020-09-06 NOTE — Progress Notes (Signed)
Subjective: 3 Days Post-Op Procedure(s) (LRB): ARTHROPLASTY BIPOLAR HIP (HEMIARTHROPLASTY) (Left) Patient is arousable this morning but still somnolent.  Speech is difficult.  Dressing is dry.  The hip is stable.  Patient reports pain as mild.  Objective:   VITALS:   Vitals:   09/06/20 0403 09/06/20 0745  BP: (!) 87/73 105/62  Pulse: 76 82  Resp: 18 18  Temp: 98.1 F (36.7 C) 99.2 F (37.3 C)  SpO2: 100% 97%    Neurologically intact Dorsiflexion/Plantar flexion intact Incision: dressing C/D/I  LABS Recent Labs    09/03/20 1118 09/04/20 0532 09/05/20 0442  HGB 8.7* 8.9* 8.0*  HCT 27.3* 26.7* 24.1*  WBC 13.1* 12.2* 7.8  PLT 109* 105* 95*    Recent Labs    09/03/20 1118 09/04/20 0532 09/05/20 0442  NA  --  134* 134*  K  --  4.8 4.6  BUN  --  31* 29*  CREATININE 1.19 1.28* 1.20  GLUCOSE  --  114* 89    No results for input(s): LABPT, INR in the last 72 hours.   Assessment/Plan: 3 Days Post-Op Procedure(s) (LRB): ARTHROPLASTY BIPOLAR HIP (HEMIARTHROPLASTY) (Left)   Up with therapy Discharge to SNF   Partial weightbearing left leg with walker  Return to clinic in 2 weeks for x-ray and staple removal

## 2020-09-07 ENCOUNTER — Telehealth: Payer: Self-pay | Admitting: Family Medicine

## 2020-09-07 DIAGNOSIS — L89302 Pressure ulcer of unspecified buttock, stage 2: Secondary | ICD-10-CM | POA: Diagnosis not present

## 2020-09-07 DIAGNOSIS — S72002A Fracture of unspecified part of neck of left femur, initial encounter for closed fracture: Secondary | ICD-10-CM | POA: Diagnosis not present

## 2020-09-07 DIAGNOSIS — Z23 Encounter for immunization: Secondary | ICD-10-CM | POA: Diagnosis not present

## 2020-09-07 DIAGNOSIS — C61 Malignant neoplasm of prostate: Secondary | ICD-10-CM | POA: Diagnosis not present

## 2020-09-07 DIAGNOSIS — J189 Pneumonia, unspecified organism: Secondary | ICD-10-CM | POA: Diagnosis not present

## 2020-09-07 DIAGNOSIS — E870 Hyperosmolality and hypernatremia: Secondary | ICD-10-CM | POA: Diagnosis not present

## 2020-09-07 DIAGNOSIS — I2510 Atherosclerotic heart disease of native coronary artery without angina pectoris: Secondary | ICD-10-CM | POA: Diagnosis not present

## 2020-09-07 DIAGNOSIS — S7292XD Unspecified fracture of left femur, subsequent encounter for closed fracture with routine healing: Secondary | ICD-10-CM | POA: Diagnosis not present

## 2020-09-07 DIAGNOSIS — Z743 Need for continuous supervision: Secondary | ICD-10-CM | POA: Diagnosis not present

## 2020-09-07 DIAGNOSIS — M6281 Muscle weakness (generalized): Secondary | ICD-10-CM | POA: Diagnosis not present

## 2020-09-07 DIAGNOSIS — B965 Pseudomonas (aeruginosa) (mallei) (pseudomallei) as the cause of diseases classified elsewhere: Secondary | ICD-10-CM | POA: Diagnosis not present

## 2020-09-07 DIAGNOSIS — J811 Chronic pulmonary edema: Secondary | ICD-10-CM | POA: Diagnosis not present

## 2020-09-07 DIAGNOSIS — E872 Acidosis: Secondary | ICD-10-CM | POA: Diagnosis not present

## 2020-09-07 DIAGNOSIS — I4811 Longstanding persistent atrial fibrillation: Secondary | ICD-10-CM

## 2020-09-07 DIAGNOSIS — N3080 Other cystitis without hematuria: Secondary | ICD-10-CM | POA: Diagnosis not present

## 2020-09-07 DIAGNOSIS — R6889 Other general symptoms and signs: Secondary | ICD-10-CM | POA: Diagnosis not present

## 2020-09-07 DIAGNOSIS — D500 Iron deficiency anemia secondary to blood loss (chronic): Secondary | ICD-10-CM | POA: Diagnosis not present

## 2020-09-07 DIAGNOSIS — I499 Cardiac arrhythmia, unspecified: Secondary | ICD-10-CM | POA: Diagnosis not present

## 2020-09-07 DIAGNOSIS — R1310 Dysphagia, unspecified: Secondary | ICD-10-CM | POA: Diagnosis not present

## 2020-09-07 DIAGNOSIS — R404 Transient alteration of awareness: Secondary | ICD-10-CM | POA: Diagnosis not present

## 2020-09-07 DIAGNOSIS — R64 Cachexia: Secondary | ICD-10-CM | POA: Diagnosis not present

## 2020-09-07 DIAGNOSIS — A4152 Sepsis due to Pseudomonas: Secondary | ICD-10-CM | POA: Diagnosis not present

## 2020-09-07 DIAGNOSIS — R0602 Shortness of breath: Secondary | ICD-10-CM | POA: Diagnosis not present

## 2020-09-07 DIAGNOSIS — E43 Unspecified severe protein-calorie malnutrition: Secondary | ICD-10-CM | POA: Diagnosis not present

## 2020-09-07 DIAGNOSIS — J9601 Acute respiratory failure with hypoxia: Secondary | ICD-10-CM | POA: Diagnosis not present

## 2020-09-07 DIAGNOSIS — R531 Weakness: Secondary | ICD-10-CM | POA: Diagnosis not present

## 2020-09-07 DIAGNOSIS — Z87891 Personal history of nicotine dependence: Secondary | ICD-10-CM | POA: Diagnosis not present

## 2020-09-07 DIAGNOSIS — Z7189 Other specified counseling: Secondary | ICD-10-CM | POA: Diagnosis not present

## 2020-09-07 DIAGNOSIS — R279 Unspecified lack of coordination: Secondary | ICD-10-CM | POA: Diagnosis not present

## 2020-09-07 DIAGNOSIS — Z87440 Personal history of urinary (tract) infections: Secondary | ICD-10-CM | POA: Diagnosis not present

## 2020-09-07 DIAGNOSIS — Z20822 Contact with and (suspected) exposure to covid-19: Secondary | ICD-10-CM | POA: Diagnosis not present

## 2020-09-07 DIAGNOSIS — Z66 Do not resuscitate: Secondary | ICD-10-CM | POA: Diagnosis not present

## 2020-09-07 DIAGNOSIS — R2689 Other abnormalities of gait and mobility: Secondary | ICD-10-CM | POA: Diagnosis not present

## 2020-09-07 DIAGNOSIS — N1831 Chronic kidney disease, stage 3a: Secondary | ICD-10-CM | POA: Diagnosis not present

## 2020-09-07 DIAGNOSIS — J690 Pneumonitis due to inhalation of food and vomit: Secondary | ICD-10-CM | POA: Diagnosis not present

## 2020-09-07 DIAGNOSIS — E785 Hyperlipidemia, unspecified: Secondary | ICD-10-CM | POA: Diagnosis not present

## 2020-09-07 DIAGNOSIS — R5381 Other malaise: Secondary | ICD-10-CM | POA: Diagnosis not present

## 2020-09-07 DIAGNOSIS — Z951 Presence of aortocoronary bypass graft: Secondary | ICD-10-CM | POA: Diagnosis not present

## 2020-09-07 DIAGNOSIS — W010XXA Fall on same level from slipping, tripping and stumbling without subsequent striking against object, initial encounter: Secondary | ICD-10-CM | POA: Diagnosis not present

## 2020-09-07 DIAGNOSIS — I10 Essential (primary) hypertension: Secondary | ICD-10-CM | POA: Diagnosis not present

## 2020-09-07 DIAGNOSIS — R6520 Severe sepsis without septic shock: Secondary | ICD-10-CM | POA: Diagnosis not present

## 2020-09-07 DIAGNOSIS — Z8546 Personal history of malignant neoplasm of prostate: Secondary | ICD-10-CM | POA: Diagnosis not present

## 2020-09-07 DIAGNOSIS — I255 Ischemic cardiomyopathy: Secondary | ICD-10-CM | POA: Diagnosis not present

## 2020-09-07 DIAGNOSIS — Z9181 History of falling: Secondary | ICD-10-CM | POA: Diagnosis not present

## 2020-09-07 DIAGNOSIS — K5900 Constipation, unspecified: Secondary | ICD-10-CM | POA: Diagnosis not present

## 2020-09-07 DIAGNOSIS — Y92009 Unspecified place in unspecified non-institutional (private) residence as the place of occurrence of the external cause: Secondary | ICD-10-CM | POA: Diagnosis not present

## 2020-09-07 DIAGNOSIS — I130 Hypertensive heart and chronic kidney disease with heart failure and stage 1 through stage 4 chronic kidney disease, or unspecified chronic kidney disease: Secondary | ICD-10-CM | POA: Diagnosis not present

## 2020-09-07 DIAGNOSIS — R069 Unspecified abnormalities of breathing: Secondary | ICD-10-CM | POA: Diagnosis not present

## 2020-09-07 DIAGNOSIS — I252 Old myocardial infarction: Secondary | ICD-10-CM | POA: Diagnosis not present

## 2020-09-07 DIAGNOSIS — K449 Diaphragmatic hernia without obstruction or gangrene: Secondary | ICD-10-CM | POA: Diagnosis not present

## 2020-09-07 DIAGNOSIS — Z7901 Long term (current) use of anticoagulants: Secondary | ICD-10-CM | POA: Diagnosis not present

## 2020-09-07 DIAGNOSIS — M5441 Lumbago with sciatica, right side: Secondary | ICD-10-CM | POA: Diagnosis not present

## 2020-09-07 DIAGNOSIS — N390 Urinary tract infection, site not specified: Secondary | ICD-10-CM | POA: Diagnosis not present

## 2020-09-07 DIAGNOSIS — E876 Hypokalemia: Secondary | ICD-10-CM | POA: Diagnosis not present

## 2020-09-07 DIAGNOSIS — I480 Paroxysmal atrial fibrillation: Secondary | ICD-10-CM | POA: Diagnosis not present

## 2020-09-07 DIAGNOSIS — Z79899 Other long term (current) drug therapy: Secondary | ICD-10-CM | POA: Diagnosis not present

## 2020-09-07 DIAGNOSIS — I272 Pulmonary hypertension, unspecified: Secondary | ICD-10-CM | POA: Diagnosis not present

## 2020-09-07 DIAGNOSIS — Z96642 Presence of left artificial hip joint: Secondary | ICD-10-CM | POA: Diagnosis not present

## 2020-09-07 DIAGNOSIS — N183 Chronic kidney disease, stage 3 unspecified: Secondary | ICD-10-CM | POA: Diagnosis not present

## 2020-09-07 DIAGNOSIS — Z515 Encounter for palliative care: Secondary | ICD-10-CM | POA: Diagnosis not present

## 2020-09-07 DIAGNOSIS — K219 Gastro-esophageal reflux disease without esophagitis: Secondary | ICD-10-CM | POA: Diagnosis not present

## 2020-09-07 DIAGNOSIS — I5032 Chronic diastolic (congestive) heart failure: Secondary | ICD-10-CM | POA: Diagnosis not present

## 2020-09-07 DIAGNOSIS — A419 Sepsis, unspecified organism: Secondary | ICD-10-CM | POA: Diagnosis not present

## 2020-09-07 DIAGNOSIS — I517 Cardiomegaly: Secondary | ICD-10-CM | POA: Diagnosis not present

## 2020-09-07 DIAGNOSIS — D508 Other iron deficiency anemias: Secondary | ICD-10-CM | POA: Diagnosis not present

## 2020-09-07 DIAGNOSIS — R1312 Dysphagia, oropharyngeal phase: Secondary | ICD-10-CM | POA: Diagnosis not present

## 2020-09-07 LAB — CBC
HCT: 23.7 % — ABNORMAL LOW (ref 39.0–52.0)
Hemoglobin: 7.7 g/dL — ABNORMAL LOW (ref 13.0–17.0)
MCH: 32.6 pg (ref 26.0–34.0)
MCHC: 32.5 g/dL (ref 30.0–36.0)
MCV: 100.4 fL — ABNORMAL HIGH (ref 80.0–100.0)
Platelets: 136 10*3/uL — ABNORMAL LOW (ref 150–400)
RBC: 2.36 MIL/uL — ABNORMAL LOW (ref 4.22–5.81)
RDW: 17.2 % — ABNORMAL HIGH (ref 11.5–15.5)
WBC: 6.7 10*3/uL (ref 4.0–10.5)
nRBC: 0 % (ref 0.0–0.2)

## 2020-09-07 LAB — MAGNESIUM: Magnesium: 2.3 mg/dL (ref 1.7–2.4)

## 2020-09-07 LAB — SARS CORONAVIRUS 2 (TAT 6-24 HRS): SARS Coronavirus 2: NEGATIVE

## 2020-09-07 MED ORDER — ADULT MULTIVITAMIN W/MINERALS CH: 1.0000 | ORAL_TABLET | Freq: Every day | ORAL | 0 refills | Status: AC

## 2020-09-07 MED ORDER — POLYETHYLENE GLYCOL 3350 17 G PO PACK: 17.0000 g | PACK | Freq: Every day | ORAL | 0 refills | Status: DC

## 2020-09-07 MED ORDER — NUBEQA 300 MG PO TABS: 600.0000 mg | ORAL_TABLET | Freq: Two times a day (BID) | ORAL | 0 refills | Status: AC

## 2020-09-07 MED ORDER — TRAMADOL HCL 50 MG PO TABS: 50.0000 mg | ORAL_TABLET | Freq: Four times a day (QID) | ORAL | 0 refills | Status: DC | PRN

## 2020-09-07 MED ORDER — DOCUSATE SODIUM 100 MG PO CAPS: 100.0000 mg | ORAL_CAPSULE | Freq: Two times a day (BID) | ORAL | 0 refills | Status: DC

## 2020-09-07 MED ORDER — FERROUS SULFATE 325 (65 FE) MG PO TABS: 325.0000 mg | ORAL_TABLET | Freq: Every day | ORAL | 3 refills | Status: AC

## 2020-09-07 MED ORDER — BISACODYL 10 MG RE SUPP: 10.0000 mg | Freq: Every day | RECTAL | 0 refills | Status: DC | PRN

## 2020-09-07 MED ORDER — NUBEQA 300 MG PO TABS: 600.0000 mg | ORAL_TABLET | Freq: Two times a day (BID) | ORAL | 0 refills | Status: DC

## 2020-09-07 NOTE — Telephone Encounter (Signed)
[facility] called in to schedule a hospital follow up for patient he is scheduled for 2-21 at 11:30

## 2020-09-07 NOTE — Discharge Instructions (Signed)
Diet recommendations: Dysphagia 1 (puree);Nectar-thick liquid Liquids provided via: Straw;Cup Medication Administration: Crushed with puree Supervision: Full supervision/cueing for compensatory strategies Compensations: Minimize environmental distractions;Slow rate;Small sips/bites Postural Changes and/or Swallow Maneuvers: Seated upright 90 degrees;Upright 30-60 min after meal   Speech therapy and dietitian to follow at nursing home

## 2020-09-07 NOTE — Progress Notes (Signed)
Pt d/c via [facility] NAD noted.  IVs removed without issue.  Daughter [name] was phoned by this nurse and informed that he had left.

## 2020-09-07 NOTE — TOC Progression Note (Signed)
Transition of Care [facility]) - Progression Note    Patient Details  Name: DEMAREE LIBERTO MRN: [id number] Date of Birth: 02-25-1936  Transition [facility]) CM/SW [place], RN Phone Number: 09/07/2020, 7:55 AM  Clinical Narrative:   RNCM received call from [place] with [facility] late Thursday afternoon stating they had gotten a call from patient's son and he wants patient to come there. Requested information be re-sent. Right after that call received a call from patient's son with same information. Clinical information re-sent to [facility]. Audry Pili called back and confirmed he can take patient. RNCM updated SNF facility through [employer] [facility] so that authorization can be updated. Patient should be able to discharge to [place] today. RNCM will complete EMS paperwork and assist in facilitating transport once patient is ready.     Expected Discharge Plan: [place] Barriers to Discharge: Continued Medical Work up  Expected Discharge Plan and Services Expected Discharge Plan: [place]   Discharge Planning Services: CM Consult   Living arrangements for the past 2 months: Single Family Home                                       Social Determinants of Health (SDOH) Interventions    Readmission Risk Interventions Readmission Risk Prevention Plan 09/04/2020  Transportation Screening Complete  Social Work Consult for [place] Planning/Counseling Complete  Medication Review [employer]) Complete  Some recent data might be hidden

## 2020-09-07 NOTE — Telephone Encounter (Signed)
Noted. Will follow.  

## 2020-09-07 NOTE — Care Management Important Message (Signed)
Important Message  Patient Details  Name: Carl [name] MRN: [id number] Date of Birth: 1936-02-21   Medicare Important Message Given:  Yes - Important Message mailed due to current National Emergency  Reviewed with daughter, Avelino Herren at [phone].  Copy of Medicare IM mailed to home address on file.    Linley Moxley 09/07/2020, 10:00 AM

## 2020-09-07 NOTE — Discharge Summary (Signed)
[facility] NAME: Carl [name]    MR#:  [id number]  DATE OF BIRTH:  Jul 25, 1936  DATE OF ADMISSION:  09/02/2020 ADMITTING PHYSICIAN: Collier Bullock, MD  DATE OF DISCHARGE: 09/07/2020  PRIMARY CARE PHYSICIAN: [name], MD    ADMISSION DIAGNOSIS:  Closed left hip fracture ([place]) [S72.002A] Closed fracture of neck of left femur, initial encounter ([place]) [S72.002A] Longstanding persistent atrial fibrillation (HCC) [I48.11]  DISCHARGE DIAGNOSIS:  close fracture neck of left femur status post surgery status post mechanical fall history of persistent atrial fibrillation on oral anticoagulation  SECONDARY DIAGNOSIS:   Past Medical History:  Diagnosis Date  . (HFpEF) heart failure with preserved ejection fraction ([place])    a. 08/2016 Echo: EF 50-55%.  [name] AAA (abdominal aortic aneurysm) ([place]) 06/05/2019   abdominal aortic aneurysm measuring 3.1 cm.  . Arthritis    arms  . Bladder calculus   . Chronic anemia   . Chronic anxiety   . CKD (chronic kidney disease), stage III ([place])   . Coronary artery disease CARDIOLOGIST-  DR Rockey Situ   a. CABG 2006 with  LIMA-LAD, SVG-[place], SVG-OM, seq SVG-PLV-PDA.  . DOE (dyspnea on exertion)   . Frequent falls 11/2019  . GERD (gastroesophageal reflux disease)   . Heart murmur   . Hematuria   . Hip fracture ([place]) 10/05/2018  . History of bladder stone   . History of colon polyps   . History of non-ST elevation myocardial infarction (NSTEMI) 03/30/2005   post op recovery inguinal hernia repair  . History of pressure ulcer   . History of recurrent UTIs    last admission 08-04-2018 pseudomonas UTI  . Hyperlipidemia   . Hyperplasia of prostate without lower urinary tract symptoms (LUTS)   . Hypertension   . Indwelling Foley catheter present   . Intracranial hematoma ([place]) 10/05/2018  . Ischemic cardiomyopathy    a. echo (01/13) 45-50% septal akinesis, mild MR; b. Echo  (01/16) ef 60-65%,  mid-apical anteroseptal hypokinesis; c. echo 09-16-2016 ef 50-55%  . Left ureteral stone   . Lung nodule    noted 08/ 2018;  followed by pcp  . LVH (left ventricular hypertrophy) 09/16/2016   Moderate noted on ECHO  . Moderate mitral regurgitation    a.08/2016 Echo: Mod MR/TR.  [name] Orthostatic hypotension   . PAC (premature atrial contraction)   . Paroxysmal atrial flutter ([place])    a. dx 08/2016, started on amiodarone.  . Persistent atrial fibrillation [facility]) followed by cardiologist-- dr Rockey Situ   a. dx AFib 08/2016 s/p DCCV 10/2016-->eliquis (CHA2DS2VASc = 5).  . Pneumonia   . Pulmonary hypertension ([place])    last echo in epic 09-16-2016  . Recurrent prostate cancer ([place]) UROLOGIST-- DR BELL   s/p  radioactive prostate seed implants in 1997;    due to PSA rising pt has been doing lupron injection's few past several yrs at dr bell office  . Renal calculi    bilateral per CT 06-18-2018  . S/P CABG x 5 04/04/2005   LIMA to LAD,  SVG to Ramus,  SVG to OM,  seqSVG to PLV and PDA  . Wears glasses     HOSPITAL COURSE:  Carl Mckillop Hamiltonis a 85 y.o.malewith medical history significant forparoxysmal atrial fibrillation on anticoagulation therapy with Eliquis, chronic diastolic dysfunction CHF, abdominal aortic aneurysm, stage III chronic kidney disease and coronary artery disease s/p CABG who presented to the ER via EMS for evaluation following a fall at  home. Patient stated that he tripped and fell landing on his left side. He denies having any dizziness or lightheadedness prior to the fall and denies any loss of consciousness. He hit his head and upon presentation to the ER complained of neck pain and pain in his left hip.   Closed left hip fracture S/pleft HEMIARTHROPLASTYon 2/7--Dr Sabra Heck Following a mechanical fall at home --found to have amoderately displaced proximal left femoral neck fracture. --pain control --PT eval --recs Rehab --Partial weightbearing left leg --Return  to clinic in 2 weeks for x-rays and staple removal -- dressing changes per Dr. Sabra Heck  History of paroxysmal atrial fibrillation on Eliquis Patient is currently in sinus rhythm and is rate controlled on amiodarone --cont home amiodarone --resume Eliquis tomorrow, per ortho --pt is in chronic afib--d/c tele. HR 80-90  History of prostate cancer --cont home darolutamide  stage 2 chronic kidney disease Stable  Nutrition Status: Nutrition Problem: Severe Malnutrition Etiology: chronic illness (CHF, CKD) Signs/Symptoms: severe fat depletion,severe muscle depletion Interventions: Refer to RD note for recommendations  Mild dysphagia Diet recommendations: Dysphagia 1 (puree);Nectar-thick liquid Liquids provided via: Straw;Cup Medication Administration: Crushed with puree  Acute on chronic anemia, due to blood loss --Hgb 8-9's. --needed 3u pRBC total during and after surgery. Plan: --monitor and transfuse to keep Hgb >7 -- continue iron supplements  Thrombocytopenia --monitor for now   Patient has multiple comorbidities and risk factors making him susceptible to readmission.   DVT prophylaxis:On:Eliquis Code Status:DNR Family Communication:spoke with daughter [name] yesterday on the phone and discussed discharge plan she is in agreement. Status IA:[id number] Dispo: The patient is from: home Anticipated d/c is to: SNF rehab Anticipated d/c date is: 2/11 Patient currently is medically best at baseline. Patient will discharged to rehab today Level of care: Med-Surg   Repeat covid negative  CONSULTS OBTAINED:  Treatment Team:  Earnestine Leys, MD  DRUG ALLERGIES:   Allergies  Allergen Reactions  . Ciprofloxacin Other (See Comments)    PER HEART DR  . Ibuprofen Other (See Comments)    Kidney issues    DISCHARGE MEDICATIONS:   Allergies as of 09/07/2020      Reactions   Ciprofloxacin Other (See Comments)   PER HEART DR   Ibuprofen Other (See  Comments)   Kidney issues      Medication List    TAKE these medications   acetaminophen 500 MG tablet Commonly known as: TYLENOL Take 1,000 mg by mouth every 6 (six) hours as needed for mild pain (for pain).   amiodarone 200 MG tablet Commonly known as: PACERONE Take 1 tablet (200 mg total) by mouth daily.   apixaban 5 MG Tabs tablet Commonly known as: Eliquis Take 1 tablet (5 mg total) by mouth 2 (two) times daily.   bisacodyl 10 MG suppository Commonly known as: DULCOLAX Place 1 suppository (10 mg total) rectally daily as needed for moderate constipation.   docusate sodium 100 MG capsule Commonly known as: COLACE Take 1 capsule (100 mg total) by mouth 2 (two) times daily.   ferrous sulfate 325 (65 FE) MG tablet Take 1 tablet (325 mg total) by mouth daily with breakfast. What changed: See the new instructions.   multivitamin with minerals Tabs tablet Take 1 tablet by mouth daily.   Nubeqa 300 MG tablet Generic drug: darolutamide Take 600 mg by mouth 2 (two) times daily.   omeprazole 40 MG capsule Commonly known as: PRILOSEC TAKE 1 CAPSULE BY MOUTH EVERY DAY What changed: how much to take  polyethylene glycol 17 g packet Commonly known as: MIRALAX / GLYCOLAX Take 17 g by mouth daily.   rosuvastatin 40 MG tablet Commonly known as: CRESTOR TAKE 1 TABLET BY MOUTH EVERY DAY   tamsulosin 0.4 MG Caps capsule Commonly known as: FLOMAX Take 1 capsule (0.4 mg total) by mouth daily after supper.   traMADol 50 MG tablet Commonly known as: ULTRAM Take 1 tablet (50 mg total) by mouth every 6 (six) hours as needed for moderate pain.            Discharge Care Instructions  (From admission, onward)         Start     Ordered   09/07/20 0000  Discharge wound care:       Comments: Per Dr Sabra Heck apply a large Aquacel as needed. Reinforce dressing till discontinued   09/07/20 0801          If you experience worsening of your admission symptoms, develop  shortness of breath, life threatening emergency, suicidal or homicidal thoughts you must seek medical attention immediately by calling 911 or calling your MD immediately  if symptoms less severe.  You Must read complete instructions/literature along with all the possible adverse reactions/side effects for all the Medicines you take and that have been prescribed to you. Take any new Medicines after you have completely understood and accept all the possible adverse reactions/side effects.   Please note  You were cared for by a hospitalist during your hospital stay. If you have any questions about your discharge medications or the care you received while you were in the hospital after you are discharged, you can call the unit and asked to speak with the hospitalist on call if the hospitalist that took care of you is not available. Once you are discharged, your primary care physician will handle any further medical issues. Please note that NO REFILLS for any discharge medications will be authorized once you are discharged, as it is imperative that you return to your primary care physician (or establish a relationship with a primary care physician if you do not have one) for your aftercare needs so that they can reassess your need for medications and monitor your lab values. Today   SUBJECTIVE  resting quietly. Says good morning when attempted to wake him. Mild discharge per RN from left hip dressing site. Dr. Sabra Heck aware of it   VITAL SIGNS:  Blood pressure 98/63, pulse 90, temperature 99.8 F (37.7 C), temperature source Oral, resp. rate 20, height 5\' 8"  (1.727 m), weight 73.1 kg, SpO2 93 %.  I/O:    Intake/Output Summary (Last 24 hours) at 09/07/2020 0804 Last data filed at 09/07/2020 0502 Gross per 24 hour  Intake -  Output 500 ml  Net -500 ml    PHYSICAL EXAMINATION:  GENERAL:  85 y.o.-year-old patient lying in the bed with no acute distress.Thin, fraile  LUNGS: Normal breath sounds  bilaterally, no wheezing, rales,rhonchi or crepitation. No use of accessory muscles of respiration.  CARDIOVASCULAR: S1, S2 normal. No murmurs, rubs, or gallops.  ABDOMEN: Soft, non-tender, non-distended. Bowel sounds present. No organomegaly or mass.  EXTREMITIES: left hip dressing NEUROLOGIC: generalized weakness. No focal deficit PSYCHIATRIC: The patient is alert and oriented x 3.  SKIN: Pressure Injury 10/09/18 Stage II -  Partial thickness loss of dermis presenting as a shallow open ulcer with a red, pink wound bed without slough. pt had this wound from home per his report; chronic partial thickness wounds related to shear, not  pressur (Active)  10/09/18 0730  Location: Buttocks  Location Orientation: Left;Right  Staging: Stage II -  Partial thickness loss of dermis presenting as a shallow open ulcer with a red, pink wound bed without slough.  Wound Description (Comments): pt had this wound from home per his report; chronic partial thickness wounds related to shear, not pressure  Present on Admission: Yes    DATA REVIEW:   CBC  Recent Labs  Lab 09/07/20 0630  WBC 6.7  HGB 7.7*  HCT 23.7*  PLT 136*    Chemistries  Recent Labs  Lab 09/02/20 0758 09/03/20 0503 09/05/20 0442 09/06/20 0631 09/07/20 0630  NA 136   < > 134*  --   --   K 5.0   < > 4.6  --   --   CL 107   < > 106  --   --   CO2 21*   < > 20*  --   --   GLUCOSE 118*   < > 89  --   --   BUN 25*   < > 29*  --   --   CREATININE 1.48*   < > 1.20  --   --   CALCIUM 8.4*   < > 7.9*  --   --   MG  --    < > 1.8   < > 2.3  AST 135*  --   --   --   --   ALT 141*  --   --   --   --   ALKPHOS 80  --   --   --   --   BILITOT 0.7  --   --   --   --    < > = values in this interval not displayed.    Microbiology Results   Recent Results (from the past 240 hour(s))  SARS Coronavirus 2 by RT PCR (hospital order, performed in [facility] hospital lab) Nasopharyngeal Nasopharyngeal Swab     Status: None   Collection  Time: 09/02/20  9:21 AM   Specimen: Nasopharyngeal Swab  Result Value Ref Range Status   SARS Coronavirus 2 NEGATIVE NEGATIVE Final    Comment: (NOTE) SARS-CoV-2 target nucleic acids are NOT DETECTED.  The SARS-CoV-2 RNA is generally detectable in upper and lower respiratory specimens during the acute phase of infection. The lowest concentration of SARS-CoV-2 viral copies this assay can detect is 250 copies / mL. A negative result does not preclude SARS-CoV-2 infection and should not be used as the sole basis for treatment or other patient management decisions.  A negative result may occur with improper specimen collection / handling, submission of specimen other than nasopharyngeal swab, presence of viral mutation(s) within the areas targeted by this assay, and inadequate number of viral copies (<250 copies / mL). A negative result must be combined with clinical observations, patient history, and epidemiological information.  Fact Sheet for Patients:   StrictlyIdeas.no  Fact Sheet for Healthcare Providers: BankingDealers.co.za  This test is not yet approved or  cleared by the [place] FDA and has been authorized for detection and/or diagnosis of SARS-CoV-2 by FDA under an Emergency Use Authorization (EUA).  This EUA will remain in effect (meaning this test can be used) for the duration of the COVID-19 declaration under Section 564(b)(1) of the Act, 21 U.S.C. section 360bbb-3(b)(1), unless the authorization is terminated or revoked sooner.  Performed at [facility], [address] [place], [place] [address]  SARS CORONAVIRUS 2 (TAT 6-24 HRS) Nasopharyngeal Nasopharyngeal Swab     Status: None   Collection Time: 09/06/20  3:45 PM   Specimen: Nasopharyngeal Swab  Result Value Ref Range Status   SARS Coronavirus 2 NEGATIVE NEGATIVE Final    Comment: (NOTE) SARS-CoV-2 target nucleic acids are NOT DETECTED.  The  SARS-CoV-2 RNA is generally detectable in upper and lower respiratory specimens during the acute phase of infection. Negative results do not preclude SARS-CoV-2 infection, do not rule out co-infections with other pathogens, and should not be used as the sole basis for treatment or other patient management decisions. Negative results must be combined with clinical observations, patient history, and epidemiological information. The expected result is Negative.  Fact Sheet for Patients: SugarRoll.be  Fact Sheet for Healthcare Providers: https://www.woods-mathews.com/  This test is not yet approved or cleared by the [place] FDA and  has been authorized for detection and/or diagnosis of SARS-CoV-2 by FDA under an Emergency Use Authorization (EUA). This EUA will remain  in effect (meaning this test can be used) for the duration of the COVID-19 declaration under Se ction 564(b)(1) of the Act, 21 U.S.C. section 360bbb-3(b)(1), unless the authorization is terminated or revoked sooner.  Performed at [facility], [place] [address], [place], [place] [address]     RADIOLOGY:  No results found.   CODE STATUS:     Code Status Orders  (From admission, onward)         Start     Ordered   09/02/20 1016  Do not attempt resuscitation (DNR)  Continuous       Question Answer Comment  In the event of cardiac or respiratory ARREST Do not call a "code blue"   In the event of cardiac or respiratory ARREST Do not perform Intubation, CPR, defibrillation or ACLS   In the event of cardiac or respiratory ARREST Use medication by any route, position, wound care, and other measures to relive pain and suffering. May use oxygen, suction and manual treatment of airway obstruction as needed for comfort.   Comments CODE STATUS was discussed and patient wishes to be placed on a DO NOT RESUSCITATE      09/02/20 1017        Code Status History    Date  Active Date Inactive Code Status Order ID Comments User Context   09/02/2020 0935 09/02/2020 1017 Full Code [id number]  Collier Bullock, MD ED   12/20/2019 1731 12/23/2019 1827 DNR [id number]  Nita Sells, MD Inpatient   12/19/2019 1343 12/20/2019 1731 Full Code [id number]  Lequita Halt, MD ED   03/08/2019 1132 03/10/2019 1627 Full Code [id number]  Lang Snow, NP ED   03/02/2019 2157 03/04/2019 1836 Full Code [id number]  Jani Gravel, MD ED   10/05/2018 1758 10/12/2018 0206 Full Code [id number]  [place], [place], DO ED   08/05/2018 0007 08/09/2018 1918 Full Code [id number]  Etta Quill, DO ED   01/05/2018 0309 01/06/2018 2143 Full Code [id number]  Rise Patience, MD ED   03/12/2017 1628 03/17/2017 2043 Full Code [id number]  Murlean Iba, MD Inpatient   Advance Care Planning Activity       TOTAL TIME TAKING CARE OF THIS PATIENT: *40* minutes.    Fritzi Mandes M.D  [facility]    CC: Primary care physician; [name], MD

## 2020-09-10 ENCOUNTER — Emergency Department: Payer: Medicare Other

## 2020-09-10 ENCOUNTER — Inpatient Hospital Stay
Admission: EM | Admit: 2020-09-10 | Discharge: 2020-09-18 | DRG: 871 | Disposition: A | Discharge status: Skilled Nursing Facility | Payer: Medicare Other | Source: Skilled Nursing Facility | Attending: Internal Medicine | Admitting: Internal Medicine

## 2020-09-10 ENCOUNTER — Inpatient Hospital Stay: Payer: Medicare Other

## 2020-09-10 DIAGNOSIS — R5381 Other malaise: Secondary | ICD-10-CM | POA: Diagnosis not present

## 2020-09-10 DIAGNOSIS — I255 Ischemic cardiomyopathy: Secondary | ICD-10-CM | POA: Diagnosis present

## 2020-09-10 DIAGNOSIS — N183 Chronic kidney disease, stage 3 unspecified: Secondary | ICD-10-CM | POA: Diagnosis present

## 2020-09-10 DIAGNOSIS — L89322 Pressure ulcer of left buttock, stage 2: Secondary | ICD-10-CM | POA: Diagnosis present

## 2020-09-10 DIAGNOSIS — E870 Hyperosmolality and hypernatremia: Secondary | ICD-10-CM

## 2020-09-10 DIAGNOSIS — E876 Hypokalemia: Secondary | ICD-10-CM | POA: Diagnosis not present

## 2020-09-10 DIAGNOSIS — I252 Old myocardial infarction: Secondary | ICD-10-CM

## 2020-09-10 DIAGNOSIS — R6520 Severe sepsis without septic shock: Secondary | ICD-10-CM | POA: Diagnosis not present

## 2020-09-10 DIAGNOSIS — I4811 Longstanding persistent atrial fibrillation: Secondary | ICD-10-CM | POA: Diagnosis not present

## 2020-09-10 DIAGNOSIS — Z951 Presence of aortocoronary bypass graft: Secondary | ICD-10-CM | POA: Diagnosis not present

## 2020-09-10 DIAGNOSIS — C61 Malignant neoplasm of prostate: Secondary | ICD-10-CM | POA: Diagnosis not present

## 2020-09-10 DIAGNOSIS — I272 Pulmonary hypertension, unspecified: Secondary | ICD-10-CM | POA: Diagnosis not present

## 2020-09-10 DIAGNOSIS — Z79899 Other long term (current) drug therapy: Secondary | ICD-10-CM | POA: Diagnosis not present

## 2020-09-10 DIAGNOSIS — E43 Unspecified severe protein-calorie malnutrition: Secondary | ICD-10-CM | POA: Diagnosis not present

## 2020-09-10 DIAGNOSIS — K5900 Constipation, unspecified: Secondary | ICD-10-CM | POA: Diagnosis not present

## 2020-09-10 DIAGNOSIS — I517 Cardiomegaly: Secondary | ICD-10-CM | POA: Diagnosis not present

## 2020-09-10 DIAGNOSIS — D508 Other iron deficiency anemias: Secondary | ICD-10-CM | POA: Diagnosis not present

## 2020-09-10 DIAGNOSIS — L89302 Pressure ulcer of unspecified buttock, stage 2: Secondary | ICD-10-CM | POA: Diagnosis not present

## 2020-09-10 DIAGNOSIS — I480 Paroxysmal atrial fibrillation: Secondary | ICD-10-CM | POA: Diagnosis not present

## 2020-09-10 DIAGNOSIS — N400 Enlarged prostate without lower urinary tract symptoms: Secondary | ICD-10-CM | POA: Diagnosis present

## 2020-09-10 DIAGNOSIS — I5032 Chronic diastolic (congestive) heart failure: Secondary | ICD-10-CM | POA: Diagnosis present

## 2020-09-10 DIAGNOSIS — K449 Diaphragmatic hernia without obstruction or gangrene: Secondary | ICD-10-CM | POA: Diagnosis not present

## 2020-09-10 DIAGNOSIS — Z8546 Personal history of malignant neoplasm of prostate: Secondary | ICD-10-CM

## 2020-09-10 DIAGNOSIS — R404 Transient alteration of awareness: Secondary | ICD-10-CM | POA: Diagnosis not present

## 2020-09-10 DIAGNOSIS — Z043 Encounter for examination and observation following other accident: Secondary | ICD-10-CM | POA: Diagnosis not present

## 2020-09-10 DIAGNOSIS — Z743 Need for continuous supervision: Secondary | ICD-10-CM | POA: Diagnosis not present

## 2020-09-10 DIAGNOSIS — I2510 Atherosclerotic heart disease of native coronary artery without angina pectoris: Secondary | ICD-10-CM | POA: Diagnosis present

## 2020-09-10 DIAGNOSIS — K219 Gastro-esophageal reflux disease without esophagitis: Secondary | ICD-10-CM | POA: Diagnosis not present

## 2020-09-10 DIAGNOSIS — J9601 Acute respiratory failure with hypoxia: Secondary | ICD-10-CM

## 2020-09-10 DIAGNOSIS — A419 Sepsis, unspecified organism: Secondary | ICD-10-CM | POA: Diagnosis not present

## 2020-09-10 DIAGNOSIS — J189 Pneumonia, unspecified organism: Secondary | ICD-10-CM | POA: Diagnosis not present

## 2020-09-10 DIAGNOSIS — R531 Weakness: Secondary | ICD-10-CM

## 2020-09-10 DIAGNOSIS — Z20822 Contact with and (suspected) exposure to covid-19: Secondary | ICD-10-CM | POA: Diagnosis present

## 2020-09-10 DIAGNOSIS — B965 Pseudomonas (aeruginosa) (mallei) (pseudomallei) as the cause of diseases classified elsewhere: Secondary | ICD-10-CM

## 2020-09-10 DIAGNOSIS — D500 Iron deficiency anemia secondary to blood loss (chronic): Secondary | ICD-10-CM | POA: Diagnosis not present

## 2020-09-10 DIAGNOSIS — G8929 Other chronic pain: Secondary | ICD-10-CM | POA: Diagnosis present

## 2020-09-10 DIAGNOSIS — Z9181 History of falling: Secondary | ICD-10-CM | POA: Diagnosis not present

## 2020-09-10 DIAGNOSIS — A4152 Sepsis due to Pseudomonas: Secondary | ICD-10-CM | POA: Diagnosis not present

## 2020-09-10 DIAGNOSIS — J811 Chronic pulmonary edema: Secondary | ICD-10-CM | POA: Diagnosis not present

## 2020-09-10 DIAGNOSIS — S72002A Fracture of unspecified part of neck of left femur, initial encounter for closed fracture: Secondary | ICD-10-CM | POA: Diagnosis present

## 2020-09-10 DIAGNOSIS — E785 Hyperlipidemia, unspecified: Secondary | ICD-10-CM | POA: Diagnosis not present

## 2020-09-10 DIAGNOSIS — R069 Unspecified abnormalities of breathing: Secondary | ICD-10-CM | POA: Diagnosis not present

## 2020-09-10 DIAGNOSIS — Z7901 Long term (current) use of anticoagulants: Secondary | ICD-10-CM | POA: Diagnosis not present

## 2020-09-10 DIAGNOSIS — Z87891 Personal history of nicotine dependence: Secondary | ICD-10-CM

## 2020-09-10 DIAGNOSIS — R488 Other symbolic dysfunctions: Secondary | ICD-10-CM | POA: Diagnosis not present

## 2020-09-10 DIAGNOSIS — R1310 Dysphagia, unspecified: Secondary | ICD-10-CM | POA: Diagnosis not present

## 2020-09-10 DIAGNOSIS — Z96642 Presence of left artificial hip joint: Secondary | ICD-10-CM | POA: Diagnosis present

## 2020-09-10 DIAGNOSIS — N390 Urinary tract infection, site not specified: Secondary | ICD-10-CM

## 2020-09-10 DIAGNOSIS — R6889 Other general symptoms and signs: Secondary | ICD-10-CM | POA: Diagnosis not present

## 2020-09-10 DIAGNOSIS — R1312 Dysphagia, oropharyngeal phase: Secondary | ICD-10-CM | POA: Diagnosis not present

## 2020-09-10 DIAGNOSIS — J690 Pneumonitis due to inhalation of food and vomit: Secondary | ICD-10-CM | POA: Diagnosis not present

## 2020-09-10 DIAGNOSIS — N1831 Chronic kidney disease, stage 3a: Secondary | ICD-10-CM | POA: Diagnosis not present

## 2020-09-10 DIAGNOSIS — Z7189 Other specified counseling: Secondary | ICD-10-CM | POA: Diagnosis not present

## 2020-09-10 DIAGNOSIS — I714 Abdominal aortic aneurysm, without rupture: Secondary | ICD-10-CM | POA: Diagnosis present

## 2020-09-10 DIAGNOSIS — Z66 Do not resuscitate: Secondary | ICD-10-CM | POA: Diagnosis not present

## 2020-09-10 DIAGNOSIS — M545 Low back pain, unspecified: Secondary | ICD-10-CM | POA: Diagnosis present

## 2020-09-10 DIAGNOSIS — M6281 Muscle weakness (generalized): Secondary | ICD-10-CM | POA: Diagnosis not present

## 2020-09-10 DIAGNOSIS — I130 Hypertensive heart and chronic kidney disease with heart failure and stage 1 through stage 4 chronic kidney disease, or unspecified chronic kidney disease: Secondary | ICD-10-CM | POA: Diagnosis present

## 2020-09-10 DIAGNOSIS — E872 Acidosis: Secondary | ICD-10-CM | POA: Diagnosis not present

## 2020-09-10 DIAGNOSIS — R059 Cough, unspecified: Secondary | ICD-10-CM

## 2020-09-10 DIAGNOSIS — I10 Essential (primary) hypertension: Secondary | ICD-10-CM | POA: Diagnosis not present

## 2020-09-10 DIAGNOSIS — R64 Cachexia: Secondary | ICD-10-CM | POA: Diagnosis present

## 2020-09-10 DIAGNOSIS — Z736 Limitation of activities due to disability: Secondary | ICD-10-CM | POA: Diagnosis not present

## 2020-09-10 DIAGNOSIS — L89312 Pressure ulcer of right buttock, stage 2: Secondary | ICD-10-CM | POA: Diagnosis present

## 2020-09-10 DIAGNOSIS — R2689 Other abnormalities of gait and mobility: Secondary | ICD-10-CM | POA: Diagnosis not present

## 2020-09-10 DIAGNOSIS — S72002D Fracture of unspecified part of neck of left femur, subsequent encounter for closed fracture with routine healing: Secondary | ICD-10-CM | POA: Diagnosis not present

## 2020-09-10 DIAGNOSIS — Z87440 Personal history of urinary (tract) infections: Secondary | ICD-10-CM | POA: Diagnosis present

## 2020-09-10 DIAGNOSIS — Z515 Encounter for palliative care: Secondary | ICD-10-CM | POA: Diagnosis not present

## 2020-09-10 DIAGNOSIS — I499 Cardiac arrhythmia, unspecified: Secondary | ICD-10-CM | POA: Diagnosis not present

## 2020-09-10 DIAGNOSIS — Z96652 Presence of left artificial knee joint: Secondary | ICD-10-CM | POA: Diagnosis present

## 2020-09-10 DIAGNOSIS — R2681 Unsteadiness on feet: Secondary | ICD-10-CM | POA: Diagnosis not present

## 2020-09-10 DIAGNOSIS — L8990 Pressure ulcer of unspecified site, unspecified stage: Secondary | ICD-10-CM | POA: Insufficient documentation

## 2020-09-10 DIAGNOSIS — Z6823 Body mass index (BMI) 23.0-23.9, adult: Secondary | ICD-10-CM

## 2020-09-10 DIAGNOSIS — N3080 Other cystitis without hematuria: Secondary | ICD-10-CM | POA: Diagnosis not present

## 2020-09-10 DIAGNOSIS — D509 Iron deficiency anemia, unspecified: Secondary | ICD-10-CM | POA: Diagnosis present

## 2020-09-10 DIAGNOSIS — R279 Unspecified lack of coordination: Secondary | ICD-10-CM | POA: Diagnosis not present

## 2020-09-10 DIAGNOSIS — S7292XD Unspecified fracture of left femur, subsequent encounter for closed fracture with routine healing: Secondary | ICD-10-CM | POA: Diagnosis not present

## 2020-09-10 DIAGNOSIS — M5441 Lumbago with sciatica, right side: Secondary | ICD-10-CM | POA: Diagnosis not present

## 2020-09-10 DIAGNOSIS — R0602 Shortness of breath: Secondary | ICD-10-CM | POA: Diagnosis not present

## 2020-09-10 DIAGNOSIS — E861 Hypovolemia: Secondary | ICD-10-CM | POA: Diagnosis present

## 2020-09-10 LAB — COMPREHENSIVE METABOLIC PANEL
ALT: 69 U/L — ABNORMAL HIGH (ref 0–44)
AST: 63 U/L — ABNORMAL HIGH (ref 15–41)
Albumin: 2.5 g/dL — ABNORMAL LOW (ref 3.5–5.0)
Alkaline Phosphatase: 79 U/L (ref 38–126)
Anion gap: 10 (ref 5–15)
BUN: 48 mg/dL — ABNORMAL HIGH (ref 8–23)
CO2: 21 mmol/L — ABNORMAL LOW (ref 22–32)
Calcium: 8.3 mg/dL — ABNORMAL LOW (ref 8.9–10.3)
Chloride: 118 mmol/L — ABNORMAL HIGH (ref 98–111)
Creatinine, Ser: 1.47 mg/dL — ABNORMAL HIGH (ref 0.61–1.24)
GFR, Estimated: 47 mL/min — ABNORMAL LOW (ref >60)
Glucose, Bld: 124 mg/dL — ABNORMAL HIGH (ref 70–99)
Potassium: 5 mmol/L (ref 3.5–5.1)
Sodium: 149 mmol/L — ABNORMAL HIGH (ref 135–145)
Total Bilirubin: 2.5 mg/dL — ABNORMAL HIGH (ref 0.3–1.2)
Total Protein: 5.7 g/dL — ABNORMAL LOW (ref 6.5–8.1)

## 2020-09-10 LAB — CBC WITH DIFFERENTIAL/PLATELET
Abs Immature Granulocytes: 0.16 10*3/uL — ABNORMAL HIGH (ref 0.00–0.07)
Basophils Absolute: 0 10*3/uL (ref 0.0–0.1)
Basophils Relative: 0 %
Eosinophils Absolute: 0 10*3/uL (ref 0.0–0.5)
Eosinophils Relative: 0 %
HCT: 23.4 % — ABNORMAL LOW (ref 39.0–52.0)
Hemoglobin: 7.5 g/dL — ABNORMAL LOW (ref 13.0–17.0)
Immature Granulocytes: 1 %
Lymphocytes Relative: 2 %
Lymphs Abs: 0.3 10*3/uL — ABNORMAL LOW (ref 0.7–4.0)
MCH: 32.3 pg (ref 26.0–34.0)
MCHC: 32.1 g/dL (ref 30.0–36.0)
MCV: 100.9 fL — ABNORMAL HIGH (ref 80.0–100.0)
Monocytes Absolute: 0.7 10*3/uL (ref 0.1–1.0)
Monocytes Relative: 4 %
Neutro Abs: 14 10*3/uL — ABNORMAL HIGH (ref 1.7–7.7)
Neutrophils Relative %: 93 %
Platelets: 199 10*3/uL (ref 150–400)
RBC: 2.32 MIL/uL — ABNORMAL LOW (ref 4.22–5.81)
RDW: 17.2 % — ABNORMAL HIGH (ref 11.5–15.5)
WBC: 15.2 10*3/uL — ABNORMAL HIGH (ref 4.0–10.5)
nRBC: 0 % (ref 0.0–0.2)

## 2020-09-10 LAB — APTT: aPTT: 54 seconds — ABNORMAL HIGH (ref 24–36)

## 2020-09-10 LAB — RESP PANEL BY RT-PCR (FLU A&B, COVID) ARPGX2
Influenza A by PCR: NEGATIVE
Influenza B by PCR: NEGATIVE
SARS Coronavirus 2 by RT PCR: NEGATIVE

## 2020-09-10 LAB — PROTIME-INR
INR: 2.5 — ABNORMAL HIGH (ref 0.8–1.2)
Prothrombin Time: 26.5 seconds — ABNORMAL HIGH (ref 11.4–15.2)

## 2020-09-10 LAB — FOLATE: Folate: 16.7 ng/mL

## 2020-09-10 LAB — LACTIC ACID, PLASMA
Lactic Acid, Venous: 3.3 mmol/L (ref 0.5–1.9)
Lactic Acid, Venous: 1.3 mmol/L (ref 0.5–1.9)

## 2020-09-10 MED ORDER — ROSUVASTATIN CALCIUM 20 MG PO TABS
40.0000 mg | ORAL_TABLET | Freq: Every day | ORAL | Status: DC
  Filled 2020-09-10: qty 2

## 2020-09-10 MED ORDER — SODIUM CHLORIDE 0.9 % IV SOLN
2.0000 g | Freq: Once | INTRAVENOUS | Status: AC
  Administered 2020-09-10: 2 g via INTRAVENOUS

## 2020-09-10 MED ORDER — DAROLUTAMIDE 300 MG PO TABS: 600.0000 mg | ORAL_TABLET | Freq: Two times a day (BID) | ORAL | Status: DC

## 2020-09-10 MED ORDER — AMIODARONE HCL 200 MG PO TABS
200.0000 mg | ORAL_TABLET | Freq: Every day | ORAL | Status: DC
  Filled 2020-09-10: qty 1

## 2020-09-10 MED ORDER — SODIUM CHLORIDE 0.9% FLUSH
3.0000 mL | Freq: Two times a day (BID) | INTRAVENOUS | Status: DC
  Administered 2020-09-10 – 2020-09-17 (×10): 3 mL via INTRAVENOUS

## 2020-09-10 MED ORDER — PANTOPRAZOLE SODIUM 40 MG PO TBEC
40.0000 mg | DELAYED_RELEASE_TABLET | Freq: Every day | ORAL | Status: DC
  Filled 2020-09-10: qty 1

## 2020-09-10 MED ORDER — SODIUM CHLORIDE 0.9 % IV SOLN
2.0000 g | Freq: Two times a day (BID) | INTRAVENOUS | Status: DC
  Administered 2020-09-11 – 2020-09-14 (×8): 2 g via INTRAVENOUS
  Filled 2020-09-10 (×11): qty 2

## 2020-09-10 MED ORDER — SODIUM CHLORIDE 0.9 % IV BOLUS (SEPSIS)
1000.0000 mL | Freq: Once | INTRAVENOUS | Status: AC
  Administered 2020-09-10: 1000 mL via INTRAVENOUS

## 2020-09-10 MED ORDER — TAMSULOSIN HCL 0.4 MG PO CAPS: 0.4000 mg | ORAL_CAPSULE | Freq: Every day | ORAL | Status: DC

## 2020-09-10 MED ORDER — METRONIDAZOLE IN NACL 5-0.79 MG/ML-% IV SOLN
500.0000 mg | Freq: Once | INTRAVENOUS | Status: AC
  Administered 2020-09-10: 500 mg via INTRAVENOUS
  Filled 2020-09-10: qty 100

## 2020-09-10 MED ORDER — VANCOMYCIN HCL IN DEXTROSE 1-5 GM/200ML-% IV SOLN
1000.0000 mg | Freq: Once | INTRAVENOUS | Status: AC
  Administered 2020-09-10: 1000 mg via INTRAVENOUS
  Filled 2020-09-10: qty 200

## 2020-09-10 MED ORDER — TRAMADOL HCL 50 MG PO TABS
50.0000 mg | ORAL_TABLET | Freq: Four times a day (QID) | ORAL | Status: DC | PRN
  Administered 2020-09-11 – 2020-09-16 (×2): 50 mg via ORAL
  Filled 2020-09-10 (×2): qty 1

## 2020-09-10 MED ORDER — METRONIDAZOLE IN NACL 5-0.79 MG/ML-% IV SOLN
500.0000 mg | Freq: Three times a day (TID) | INTRAVENOUS | Status: AC
  Administered 2020-09-11 – 2020-09-16 (×18): 500 mg via INTRAVENOUS
  Filled 2020-09-10 (×19): qty 100

## 2020-09-10 MED ORDER — VANCOMYCIN HCL 1250 MG/250ML IV SOLN
1250.0000 mg | INTRAVENOUS | Status: DC
  Filled 2020-09-10: qty 250

## 2020-09-10 MED ORDER — POLYETHYLENE GLYCOL 3350 17 G PO PACK: 17.0000 g | PACK | Freq: Every day | ORAL | Status: DC | PRN

## 2020-09-10 MED ORDER — APIXABAN 5 MG PO TABS
5.0000 mg | ORAL_TABLET | Freq: Two times a day (BID) | ORAL | Status: DC
Start: 2020-09-10 — End: 2020-09-11
  Administered 2020-09-11: 5 mg via ORAL
  Filled 2020-09-10 (×2): qty 1

## 2020-09-10 MED ORDER — FERROUS SULFATE 325 (65 FE) MG PO TABS
325.0000 mg | ORAL_TABLET | Freq: Every day | ORAL | Status: DC
  Filled 2020-09-10: qty 1

## 2020-09-10 MED ORDER — IOHEXOL 350 MG/ML SOLN
75.0000 mL | Freq: Once | INTRAVENOUS | Status: AC | PRN
  Administered 2020-09-10: 75 mL via INTRAVENOUS

## 2020-09-10 MED ORDER — ACETAMINOPHEN 650 MG RE SUPP: 650.0000 mg | Freq: Four times a day (QID) | RECTAL | Status: DC | PRN

## 2020-09-10 MED ORDER — ACETAMINOPHEN 325 MG PO TABS: 650.0000 mg | ORAL_TABLET | Freq: Four times a day (QID) | ORAL | Status: DC | PRN

## 2020-09-10 MED ORDER — LACTATED RINGERS IV SOLN: INTRAVENOUS | Status: DC

## 2020-09-10 NOTE — Consult Note (Addendum)
Pharmacy Antibiotic Note  Carl [name] is a 85 y.o. male admitted on 09/10/2020 with sepsis.  Pharmacy has been consulted for Vancomycin and Cefepime dosing. Patient received Vancomycin 2g IV total and Cefepime 2g x 1 dose in ED.  Plan: 1) Vancomycin 1250 mg IV Q 24 hrs. Goal AUC 400-550. Expected AUC: 476.8 Expected Css: 12.1 SCr used: 1.47  2) Cefepime 2g IV Q 12 hours.   Will adjust dose as renal function improves   Height: 6\' 3"  (190.5 cm) Weight: 85 kg (187 lb 6.3 oz) IBW/kg (Calculated) : 84.5  Temp (24hrs), Avg:100.7 F (38.2 C), Min:100 F (37.8 C), Max:101.4 F (38.6 C)  Recent Labs  Lab 09/04/20 0532 09/05/20 0442 09/07/20 0630 09/10/20 1737 09/10/20 1740  WBC 12.2* 7.8 6.7  --  15.2*  CREATININE 1.28* 1.20  --  1.47*  --   LATICACIDVEN  --   --   --  3.3*  --     Estimated Creatinine Clearance: 46.3 mL/min (A) (by C-G formula based on SCr of 1.47 mg/dL (H)).    Allergies  Allergen Reactions  . Ciprofloxacin Other (See Comments)    PER HEART DR  . Ibuprofen Other (See Comments)    Kidney issues    Antimicrobials this admission: Vanc/Cefepime 2/14 >>    Microbiology results: 2/14 BCx: pending 2/14 UCx: pending   Thank you for allowing pharmacy to be a part of this patient's care.  Lance Coon A Nazari 09/10/2020 10:15 PM

## 2020-09-10 NOTE — Progress Notes (Signed)
CODE SEPSIS - PHARMACY COMMUNICATION  **[facility] Antibiotics should be administered within 1 hour of Sepsis diagnosis**  Time Code Sepsis Called/Page Received: 1739  Antibiotics Ordered: Vancomycin, Cefepime, Flagyl   Time of 1st antibiotic administration: 6812  Additional action taken by pharmacy: none  If necessary, Name of Provider/Nurse Contacted: n/a    Pearla Dubonnet ,PharmD Clinical Pharmacist  09/10/2020  6:02 PM

## 2020-09-10 NOTE — Sepsis Progress Note (Signed)
Notified bedside nurse of need to draw repeat lactic acid. 

## 2020-09-10 NOTE — ED Provider Notes (Signed)
[facility] Emergency Department Provider Note  Time seen: 6:50 PM  I have reviewed the triage vital signs and the nursing notes.   HISTORY  Chief Complaint Code Sepsis   HPI Carl [name] is a 85 y.o. male with a past medical history of CHF, AAA, CKD, gastric reflux, hypertension, hyperlipidemia recent hip replacement 09/03/2020 presents to the emergency department for shortness of breath from his rehab facility.   According to EMS report patient is currently at [facility] rehab after his left hip replacement.  Patient was noted to be short of breath by staff today and have a room air saturation of 77% and febrile to one 1.4.  Upon arrival patient does appear very fatigued and weak however he is able to answer basic questions and is oriented.  States some pain in the left hip but denies any other pain.  Patient is short of breath.  Past Medical History:  Diagnosis Date  . (HFpEF) heart failure with preserved ejection fraction ([place])    a. 08/2016 Echo: EF 50-55%.  [name] AAA (abdominal aortic aneurysm) ([place]) 06/05/2019   abdominal aortic aneurysm measuring 3.1 cm.  . Arthritis    arms  . Bladder calculus   . Chronic anemia   . Chronic anxiety   . CKD (chronic kidney disease), stage III ([place])   . Coronary artery disease CARDIOLOGIST-  DR Rockey Situ   a. CABG 2006 with  LIMA-LAD, SVG-Ramus, SVG-OM, seq SVG-PLV-PDA.  . DOE (dyspnea on exertion)   . Frequent falls 11/2019  . GERD (gastroesophageal reflux disease)   . Heart murmur   . Hematuria   . Hip fracture ([place]) 10/05/2018  . History of bladder stone   . History of colon polyps   . History of non-ST elevation myocardial infarction (NSTEMI) 03/30/2005   post op recovery inguinal hernia repair  . History of pressure ulcer   . History of recurrent UTIs    last admission 08-04-2018 pseudomonas UTI  . Hyperlipidemia   . Hyperplasia of prostate without lower urinary tract symptoms (LUTS)   . Hypertension   .  Indwelling Foley catheter present   . Intracranial hematoma ([place]) 10/05/2018  . Ischemic cardiomyopathy    a. echo (01/13) 45-50% septal akinesis, mild MR; b. Echo  (01/16) ef 60-65%, mid-apical anteroseptal hypokinesis; c. echo 09-16-2016 ef 50-55%  . Left ureteral stone   . Lung nodule    noted 08/ 2018;  followed by pcp  . LVH (left ventricular hypertrophy) 09/16/2016   Moderate noted on ECHO  . Moderate mitral regurgitation    a.08/2016 Echo: Mod MR/TR.  [name] Orthostatic hypotension   . PAC (premature atrial contraction)   . Paroxysmal atrial flutter ([place])    a. dx 08/2016, started on amiodarone.  . Persistent atrial fibrillation [facility]) followed by cardiologist-- dr Rockey Situ   a. dx AFib 08/2016 s/p DCCV 10/2016-->eliquis (CHA2DS2VASc = 5).  . Pneumonia   . Pulmonary hypertension ([place])    last echo in epic 09-16-2016  . Recurrent prostate cancer ([place]) UROLOGIST-- DR BELL   s/p  radioactive prostate seed implants in 1997;    due to PSA rising pt has been doing lupron injection's few past several yrs at dr bell office  . Renal calculi    bilateral per CT 06-18-2018  . S/P CABG x 5 04/04/2005   LIMA to LAD,  SVG to Ramus,  SVG to OM,  seqSVG to PLV and PDA  . Wears glasses     Patient Active  Problem List   Diagnosis Date Noted  . Closed left hip fracture ([place]) 09/02/2020  . Transaminitis 09/02/2020  . AF (paroxysmal atrial fibrillation) ([place]) 09/02/2020  . History of UTI 02/14/2020  . Constipation 02/14/2020  . Iron deficiency anemia 01/17/2020  . Near syncope   . Protein-calorie malnutrition, severe 12/22/2019  . Fall 12/19/2019  . Weight loss 08/30/2019  . Onychomycosis 08/30/2019  . AAA (abdominal aortic aneurysm) ([place]) 08/26/2018  . Kidney stones 06/30/2018  . Allergic rhinitis 04/26/2018  . Urolithiasis 01/05/2018  . Frequent urination 01/01/2018  . Colon polyps 09/15/2017  . GERD (gastroesophageal reflux disease) 09/15/2017  . Wrist pain, acute, right 05/25/2017  .  Low back pain 03/24/2017  . Lung nodule seen on imaging study: 7 mm nodule left midlung 03/13/2017  . Physical deconditioning 03/13/2017  . Normocytic anemia 03/13/2017  . Fall at home, initial encounter 03/12/2017  . Abdominal soreness 03/12/2017  . Generalized weakness 03/12/2017  . Leukocytosis 03/12/2017  . Hyponatremia 03/12/2017  . Orthostatic hypotension 02/16/2017  . Anticoagulated 09/19/2016  . Prostate CA ([place]) 09/04/2016  . Longstanding persistent atrial fibrillation ([place]) 09/04/2016  . Cardiomyopathy, ischemic 07/16/2012  . Chronic kidney disease (CKD), stage III (moderate) ([place]) 07/16/2012  . Essential hypertension 08/05/2011  . Murmur 08/05/2011  . 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 Dyslipidemia 02/04/2011    Past Surgical History:  Procedure Laterality Date  . CARDIAC CATHETERIZATION  04/02/2005   dr Doreatha Lew    Critical three-vessel coronary disease --  Essentially normal left ventricular function , ef 55%  . CARDIOVASCULAR STRESS TEST  05-01-2008  dr Doreatha Lew /  dr harding   Low risk nuclear study w/ no ischemia or scar but flattened septal motion/  ef 64%  . CARDIOVERSION N/A 11/19/2016   Procedure: CARDIOVERSION;  Surgeon: Jerline Pain, MD;  Location: [facility];  Service: Cardiovascular;  Laterality: N/A;  . COLONOSCOPY N/A 10/23/2016   Procedure: COLONOSCOPY;  Surgeon: Milus Banister, MD;  Location: [facility];  Service: Endoscopy;  Laterality: N/A;  . CORONARY ARTERY BYPASS GRAFT  04/04/2005   dr  Ricard Dillon   LIMA - LAD,  SVG - Ramus, SVG - OM,  seqSVG - PLV and PDA  . CYSTOSCOPY WITH LITHOLAPAXY N/A 05/07/2016   Procedure: CYSTOSCOPY WITH LITHOLAPAXY;  Surgeon: Rana Snare, MD;  [facility];  Service: Urology;  Laterality: N/A;  . CYSTOSCOPY WITH RETROGRADE PYELOGRAM, URETEROSCOPY AND STENT PLACEMENT  09/03/2018   Procedure: CYSTOSCOPY WITH RETROGRADE PYELOGRAM, URETEROSCOPY LASER LITHOTRIPSY AND STENT PLACEMENT;  Surgeon: Lucas Mallow, MD;  Location: [facility];  Service: Urology;;  . CYSTOSCOPY/URETEROSCOPY/HOLMIUM LASER/STENT PLACEMENT Right 06/15/2019   Procedure: CYSTOSCOPY RIGHT URETEROSCOPY HOLMIUM LASER STENT PLACEMENT;  Surgeon: Lucas Mallow, MD;  Location: [facility];  Service: Urology;  Laterality: Right;  . HIP ARTHROPLASTY Left 09/03/2020   Procedure: ARTHROPLASTY BIPOLAR HIP (HEMIARTHROPLASTY);  Surgeon: Earnestine Leys, MD;  Location: [facility];  Service: Orthopedics;  Laterality: Left;  . HOLMIUM LASER APPLICATION N/A 65/68/1275   Procedure: HOLMIUM LASER APPLICATION;  Surgeon: Rana Snare, MD;  Location: [facility];  Service: Urology;  Laterality: N/A;  . INGUINAL HERNIA REPAIR Left 03-30-2005  dr Excell Seltzer   incarcerated   . RADIOACTIVE PROSTATE SEED IMPLANTS  1997  . TOTAL HIP ARTHROPLASTY Right 10/07/2018   Procedure: TOTAL HIP ARTHROPLASTY ANTERIOR APPROACH;  Surgeon: Rod Can, MD;  Location: [facility];  Service: Orthopedics;  Laterality: Right;  . TRANSTHORACIC ECHOCARDIOGRAM  08/24/2014   mid anteroseptal and distal septak hypokinetic,  mild focal basal LVH, ef 60-65%,  grade 1 diastolic dysfunction/  mild MR/  mild LAE/  mild dilated RV with normal RVSP/  trivial TR    Prior to Admission medications   Medication Sig Start Date End Date Taking? Authorizing Provider  acetaminophen (TYLENOL) 500 MG tablet Take 1,000 mg by mouth every 6 (six) hours as needed for mild pain (for pain).     [provider]  amiodarone (PACERONE) 200 MG tablet Take 1 tablet (200 mg total) by mouth daily. 02/03/20   Loel Dubonnet, NP  apixaban (ELIQUIS) 5 MG TABS tablet Take 1 tablet (5 mg total) by mouth 2 (two) times daily. 05/03/20   [name], NP  bisacodyl (DULCOLAX) 10 MG suppository Place 1 suppository (10 mg total) rectally daily as needed for moderate constipation. 09/07/20   Fritzi Mandes, MD  darolutamide (NUBEQA) 300 MG tablet Take 2 tablets (600 mg total) by mouth 2 (two)  times daily. 09/07/20   Fritzi Mandes, MD  docusate sodium (COLACE) 100 MG capsule Take 1 capsule (100 mg total) by mouth 2 (two) times daily. 09/07/20   Fritzi Mandes, MD  ferrous sulfate 325 (65 FE) MG tablet Take 1 tablet (325 mg total) by mouth daily with breakfast. 09/07/20   Fritzi Mandes, MD  Multiple Vitamin (MULTIVITAMIN WITH MINERALS) TABS tablet Take 1 tablet by mouth daily. 09/07/20   Fritzi Mandes, MD  omeprazole (PRILOSEC) 40 MG capsule TAKE 1 CAPSULE BY MOUTH EVERY DAY Patient taking differently: Take 40 mg by mouth daily. 03/08/20   [name], MD  polyethylene glycol (MIRALAX / GLYCOLAX) 17 g packet Take 17 g by mouth daily. 09/07/20   Fritzi Mandes, MD  rosuvastatin (CRESTOR) 40 MG tablet TAKE 1 TABLET BY MOUTH EVERY DAY Patient taking differently: Take 40 mg by mouth daily. 07/05/20   [name], MD  tamsulosin (FLOMAX) 0.4 MG CAPS capsule Take 1 capsule (0.4 mg total) by mouth daily after supper. 01/06/18   [name], MD  traMADol (ULTRAM) 50 MG tablet Take 1 tablet (50 mg total) by mouth every 6 (six) hours as needed for moderate pain. 09/07/20   Fritzi Mandes, MD    Allergies  Allergen Reactions  . Ciprofloxacin Other (See Comments)    PER HEART DR  . Ibuprofen Other (See Comments)    Kidney issues    Family History  Problem Relation Age of Onset  . Heart disease Mother   . Cancer Mother   . Cancer Father   . Cancer Brother     Social History Social History   Tobacco Use  . Smoking status: Former Smoker    Years: 2.00    Types: Cigarettes    Quit date: 07/28/1960    Years since quitting: 60.1  . Smokeless tobacco: Never Used  Vaping Use  . Vaping Use: Never used  Substance Use Topics  . Alcohol use: No  . Drug use: No    Review of Systems Constitutional: Positive for fever today. Cardiovascular: Negative for chest pain. Respiratory: Positive for shortness of breath.  Denies any significant cough. Gastrointestinal: Negative for abdominal  pain Musculoskeletal: Left hip pain. Neurological: Negative for headache All other ROS negative  ____________________________________________   PHYSICAL EXAM:  VITAL SIGNS: ED Triage Vitals  Enc Vitals Group     BP 09/10/20 1735 121/60     Pulse Rate 09/10/20 1735 98     Resp 09/10/20 1735 (!)  25     Temp 09/10/20 1735 (!) 101.4 F (38.6 C)     Temp Source 09/10/20 1735 Oral     SpO2 09/10/20 1735 95 %     Weight 09/10/20 1736 187 lb 6.3 oz (85 kg)     Height 09/10/20 1736 6\' 3"  (1.905 m)     Head Circumference --      Peak Flow --      Pain Score --      Pain Loc --      Pain Edu? --      Excl. in [place]? --     Constitutional: Patient is weak and fatigued appearing but is alert to voice and will answer questions. Eyes: Normal exam ENT      Head: Normocephalic and atraumatic.      Mouth/Throat: Mucous membranes are moist. Cardiovascular: Normal rate, regular rhythm.  Respiratory: Moderate tachypnea with fairly diminished breath sounds bilaterally but no obvious wheeze rales or rhonchi.  Currently on nonrebreather satting 95%. Gastrointestinal: Soft and nontender. No distention.  Musculoskeletal: Well appearing incision to the left hip.  No obvious or significant drainage. Neurologic:  Normal speech and language. No gross focal neurologic deficits Skin:  Skin is warm Psychiatric: Mood and affect are normal.   ____________________________________________    EKG  EKG viewed and interpreted by myself appears show an irregular rhythm possibly atrial fibrillation at 96 bpm with a narrow QRS, normal axis, largely normal intervals besides slight QTC prolongation.  Nonspecific ST changes.  ____________________________________________    RADIOLOGY  Chest x-ray shows diffuse bilateral airspace disease consistent with pneumonia versus edema.  ____________________________________________   INITIAL IMPRESSION / ASSESSMENT AND PLAN / ED COURSE  Pertinent labs & imaging  results that were available during my care of the patient were reviewed by me and considered in my medical decision making (see chart for details).   Patient presents emergency department for shortness of breath found to be febrile and tachypneic meeting sepsis criteria.  We will start the patient on broad-spectrum antibiotics, check labs, cultures, chest x-ray and continue to closely monitor.  Differential is quite broad at this time would include pneumonia, hardware infection, cellulitis, abscess, UTI, Covid.  Labs have begun to result showing leukocytosis of 15,000, Covid negative lactate elevated 3.3.  X-ray consistent with pneumonia.  Chemistry consistent with dehydration.  We will continue IV antibiotics and admit the patient to the hospitalist service for further work-up and treatment.  Carl [name] was evaluated in Emergency Department on 09/10/2020 for the symptoms described in the history of present illness. He was evaluated in the context of the global COVID-19 pandemic, which necessitated consideration that the patient might be at risk for infection with the SARS-CoV-2 virus that causes COVID-19. Institutional protocols and algorithms that pertain to the evaluation of patients at risk for COVID-19 are in a state of rapid change based on information released by regulatory bodies including the CDC and federal and state organizations. These policies and algorithms were followed during the patient's care in the ED.  CRITICAL CARE Performed by: Harvest Dark   Total critical care time: 30 minutes  Critical care time was exclusive of separately billable procedures and treating other patients.  Critical care was necessary to treat or prevent imminent or life-threatening deterioration.  Critical care was time spent personally by me on the following activities: development of treatment plan with patient and/or surrogate as well as nursing, discussions with consultants, evaluation of  patient's response to treatment, examination  of patient, obtaining history from patient or surrogate, ordering and performing treatments and interventions, ordering and review of laboratory studies, ordering and review of radiographic studies, pulse oximetry and re-evaluation of patient's condition.  ____________________________________________   FINAL CLINICAL IMPRESSION(S) / ED DIAGNOSES  Sepsis Pneumonia   Harvest Dark, MD 09/10/20 [phone]

## 2020-09-10 NOTE — H&P (Addendum)
History and Physical   Carl [name] FIE:[id number] DOB: 1936-06-23 DOA: 09/10/2020  PCP: Carl Haven, MD   Patient coming from: [facility] facility  Chief Complaint: Fevers, shortness of breath  HPI: Carl [name] is a 85 y.o. male with medical history significant of recent admission for left hip fracture status post left knee arthroplasty, AAA, A. fib, CKD 2, CAD status post CABG x5 in 2006, hyperlipidemia, hypertension, GERD, iron deficiency anemia, orthostatic hypotension, prostate cancer, HFpEF who presents from his rehab facility with new oxygen requirement and fevers.  History obtained with assistance of chart review and son who was at bedside.  As above, patient was admitted from 2/6 until 2/11 due to left hip fracture following a fall.  He had this surgically repaired and was discharged to a rehab facility.  Son stated that he had noticed some blood in the urine and may be a little bit of confusion on day of discharge and they are worried that he might be developing a UTI.  Unclear if any evaluation for this was done at facility.  Patient states that he knows he had had a cough at the facility but he denies noticing fevers, shortness of breath, chills, chest pain, abdominal pain, constipation, diarrhea, nausea, vomiting.  He does report hip pain and low back pain.  Son states that the providers at the facility told him that the patient began to develop fevers today and as previously reported he was saturating 77% on room air and that he was being transported to the ED.  Son also notes that as above patient does develop some mild delirium when he has infections.  ED Course: Vital signs in the ED significant for temperature to 101.4, respiration rate in the teens to 20s, requiring 10 to 15 L nonrebreather to maintain saturation in the 90s, blood pressure stable in the 166 systolic.  Lab work-up showed BMP with sodium 149, chloride 118, potassium 5, bicarb 21,  BUN 48, creatinine 1.47 up from a baseline of 1.2, glucose 124.  LFTs showed calcium 8.3 which corrects considering albumin of 2.5, protein 5.7, AST 63 and ALT 69 which are improved from previous elevations.  T bili 2.5.  CBC shows leukocytosis to 15, hemoglobin 7.5 which is stable.  PT PTT and INR shows elevation to 26.5, 54, 2.5 respectively in setting of Eliquis use.  Lactic acid initially elevated to 3.3 with repeat pending.  Respiratory panel for flu and Covid negative.  Urinalysis, urine cultures, blood cultures pending.  Chest x-ray showed diffuse bilateral airspace disease right greater than left concerning for infection versus asymmetrical edema.  Also noted were age-indeterminate 6 and seventh rib fractures.  Patient started on vancomycin, cefepime, Flagyl and given a 1 L bolus in the ED.  Review of Systems: As per HPI otherwise all other systems reviewed and are negative.  Past Medical History:  Diagnosis Date  . (HFpEF) heart failure with preserved ejection fraction ([place])    a. 08/2016 Echo: EF 50-55%.  [name] AAA (abdominal aortic aneurysm) ([place]) 06/05/2019   abdominal aortic aneurysm measuring 3.1 cm.  . Arthritis    arms  . Bladder calculus   . Chronic anemia   . Chronic anxiety   . CKD (chronic kidney disease), stage III ([place])   . Coronary artery disease CARDIOLOGIST-  DR Rockey Situ   a. CABG 2006 with  LIMA-LAD, SVG-Ramus, SVG-OM, seq SVG-PLV-PDA.  . DOE (dyspnea on exertion)   . Frequent falls 11/2019  . GERD (  gastroesophageal reflux disease)   . Heart murmur   . Hematuria   . Hip fracture ([place]) 10/05/2018  . History of bladder stone   . History of colon polyps   . History of non-ST elevation myocardial infarction (NSTEMI) 03/30/2005   post op recovery inguinal hernia repair  . History of pressure ulcer   . History of recurrent UTIs    last admission 08-04-2018 pseudomonas UTI  . Hyperlipidemia   . Hyperplasia of prostate without lower urinary tract symptoms (LUTS)   .  Hypertension   . Indwelling Foley catheter present   . Intracranial hematoma ([place]) 10/05/2018  . Ischemic cardiomyopathy    a. echo (01/13) 45-50% septal akinesis, mild MR; b. Echo  (01/16) ef 60-65%, mid-apical anteroseptal hypokinesis; c. echo 09-16-2016 ef 50-55%  . Left ureteral stone   . Lung nodule    noted 08/ 2018;  followed by pcp  . LVH (left ventricular hypertrophy) 09/16/2016   Moderate noted on ECHO  . Moderate mitral regurgitation    a.08/2016 Echo: Mod MR/TR.  [name] Orthostatic hypotension   . PAC (premature atrial contraction)   . Paroxysmal atrial flutter ([place])    a. dx 08/2016, started on amiodarone.  . Persistent atrial fibrillation [facility]) followed by cardiologist-- dr Rockey Situ   a. dx AFib 08/2016 s/p DCCV 10/2016-->eliquis (CHA2DS2VASc = 5).  . Pneumonia   . Pulmonary hypertension ([place])    last echo in epic 09-16-2016  . Recurrent prostate cancer ([place]) UROLOGIST-- DR BELL   s/p  radioactive prostate seed implants in 1997;    due to PSA rising pt has been doing lupron injection's few past several yrs at dr bell office  . Renal calculi    bilateral per CT 06-18-2018  . S/P CABG x 5 04/04/2005   LIMA to LAD,  SVG to Ramus,  SVG to OM,  seqSVG to PLV and PDA  . Wears glasses     Past Surgical History:  Procedure Laterality Date  . CARDIAC CATHETERIZATION  04/02/2005   dr Doreatha Lew    Critical three-vessel coronary disease --  Essentially normal left ventricular function , ef 55%  . CARDIOVASCULAR STRESS TEST  05-01-2008  dr Doreatha Lew /  dr harding   Low risk nuclear study w/ no ischemia or scar but flattened septal motion/  ef 64%  . CARDIOVERSION N/A 11/19/2016   Procedure: CARDIOVERSION;  Surgeon: Jerline Pain, MD;  Location: [facility];  Service: Cardiovascular;  Laterality: N/A;  . COLONOSCOPY N/A 10/23/2016   Procedure: COLONOSCOPY;  Surgeon: Milus Banister, MD;  Location: [facility];  Service: Endoscopy;  Laterality: N/A;  . CORONARY ARTERY BYPASS GRAFT   04/04/2005   dr  Ricard Dillon   LIMA - LAD,  SVG - Ramus, SVG - OM,  seqSVG - PLV and PDA  . CYSTOSCOPY WITH LITHOLAPAXY N/A 05/07/2016   Procedure: CYSTOSCOPY WITH LITHOLAPAXY;  Surgeon: Rana Snare, MD;  Location[facility];  Service: Urology;  Laterality: N/A;  . CYSTOSCOPY WITH RETROGRADE PYELOGRAM, URETEROSCOPY AND STENT PLACEMENT  09/03/2018   Procedure: CYSTOSCOPY WITH RETROGRADE PYELOGRAM, URETEROSCOPY LASER LITHOTRIPSY AND STENT PLACEMENT;  Surgeon: Lucas Mallow, MD;  Location: [facility];  Service: Urology;;  . CYSTOSCOPY/URETEROSCOPY/HOLMIUM LASER/STENT PLACEMENT Right 06/15/2019   Procedure: CYSTOSCOPY RIGHT URETEROSCOPY HOLMIUM LASER STENT PLACEMENT;  Surgeon: Lucas Mallow, MD;  Location: [facility];  Service: Urology;  Laterality: Right;  . HIP ARTHROPLASTY Left 09/03/2020   Procedure: ARTHROPLASTY BIPOLAR HIP (HEMIARTHROPLASTY);  Surgeon: Earnestine Leys,  MD;  Location: [facility];  Service: Orthopedics;  Laterality: Left;  . HOLMIUM LASER APPLICATION N/A 59/74/1638   Procedure: HOLMIUM LASER APPLICATION;  Surgeon: Rana Snare, MD;  Location: [facility];  Service: Urology;  Laterality: N/A;  . INGUINAL HERNIA REPAIR Left 03-30-2005  dr Excell Seltzer   incarcerated   . RADIOACTIVE PROSTATE SEED IMPLANTS  1997  . TOTAL HIP ARTHROPLASTY Right 10/07/2018   Procedure: TOTAL HIP ARTHROPLASTY ANTERIOR APPROACH;  Surgeon: Rod Can, MD;  Location: [facility];  Service: Orthopedics;  Laterality: Right;  . TRANSTHORACIC ECHOCARDIOGRAM  08/24/2014   mid anteroseptal and distal septak hypokinetic,  mild focal basal LVH, ef 60-65%,  grade 1 diastolic dysfunction/  mild MR/  mild LAE/  mild dilated RV with normal RVSP/  trivial TR    Social History  reports that he quit smoking about 60 years ago. His smoking use included cigarettes. He quit after 2.00 years of use. He has never used smokeless tobacco. He reports that he does not drink alcohol and does not use  drugs.  Allergies  Allergen Reactions  . Ciprofloxacin Other (See Comments)    PER HEART DR  . Ibuprofen Other (See Comments)    Kidney issues    Family History  Problem Relation Age of Onset  . Heart disease Mother   . Cancer Mother   . Cancer Father   . Cancer Brother   Reviewed on admission  Prior to Admission medications   Medication Sig Start Date End Date Taking? Authorizing Provider  acetaminophen (TYLENOL) 500 MG tablet Take 1,000 mg by mouth every 6 (six) hours as needed for mild pain (for pain).     [provider]  amiodarone (PACERONE) 200 MG tablet Take 1 tablet (200 mg total) by mouth daily. 02/03/20   Loel Dubonnet, NP  apixaban (ELIQUIS) 5 MG TABS tablet Take 1 tablet (5 mg total) by mouth 2 (two) times daily. 05/03/20   [name], NP  bisacodyl (DULCOLAX) 10 MG suppository Place 1 suppository (10 mg total) rectally daily as needed for moderate constipation. 09/07/20   Fritzi Mandes, MD  darolutamide (NUBEQA) 300 MG tablet Take 2 tablets (600 mg total) by mouth 2 (two) times daily. 09/07/20   Fritzi Mandes, MD  docusate sodium (COLACE) 100 MG capsule Take 1 capsule (100 mg total) by mouth 2 (two) times daily. 09/07/20   Fritzi Mandes, MD  ferrous sulfate 325 (65 FE) MG tablet Take 1 tablet (325 mg total) by mouth daily with breakfast. 09/07/20   Fritzi Mandes, MD  Multiple Vitamin (MULTIVITAMIN WITH MINERALS) TABS tablet Take 1 tablet by mouth daily. 09/07/20   Fritzi Mandes, MD  omeprazole (PRILOSEC) 40 MG capsule TAKE 1 CAPSULE BY MOUTH EVERY DAY Patient taking differently: Take 40 mg by mouth daily. 03/08/20   Carl Haven, MD  polyethylene glycol (MIRALAX / GLYCOLAX) 17 g packet Take 17 g by mouth daily. 09/07/20   Fritzi Mandes, MD  rosuvastatin (CRESTOR) 40 MG tablet TAKE 1 TABLET BY MOUTH EVERY DAY Patient taking differently: Take 40 mg by mouth daily. 07/05/20   Carl Haven, MD  tamsulosin (FLOMAX) 0.4 MG CAPS capsule Take 1 capsule (0.4 mg  total) by mouth daily after supper. 01/06/18   [name], MD  traMADol (ULTRAM) 50 MG tablet Take 1 tablet (50 mg total) by mouth every 6 (six) hours as needed for moderate pain. 09/07/20   Fritzi Mandes, MD    Physical Exam: Vitals:   09/10/20  1735 09/10/20 1736 09/10/20 1839 09/10/20 1858  BP: 121/60  120/74 125/77  Pulse: 98   95  Resp: (!) 25  (!) 25 18  Temp: (!) 101.4 F (38.6 C)   100 F (37.8 C)  TempSrc: Oral   Oral  SpO2: 95%  95% 98%  Weight:  85 kg    Height:  6\' 3"  (1.905 m)     Physical Exam Constitutional:      Appearance: He is ill-appearing.     Comments: Ill-appearing elderly male  HENT:     Head: Normocephalic and atraumatic.     Mouth/Throat:     Mouth: Mucous membranes are dry.     Pharynx: Oropharynx is clear.  Eyes:     Extraocular Movements: Extraocular movements intact.     Pupils: Pupils are equal, round, and reactive to light.  Cardiovascular:     Rate and Rhythm: Normal rate and regular rhythm.     Pulses: Normal pulses.     Heart sounds: Normal heart sounds.  Pulmonary:     Effort: Pulmonary effort is normal. No respiratory distress.     Breath sounds: Rhonchi present.     Comments: Mildly increased work of breathing, on nonrebreather.  Saturating well on 10 L. Abdominal:     General: Bowel sounds are normal. There is no distension.     Palpations: Abdomen is soft.     Tenderness: There is no abdominal tenderness.  Musculoskeletal:        General: No swelling or deformity.  Skin:    General: Skin is warm and dry.  Neurological:     General: No focal deficit present.     Mental Status: He is oriented to person, place, and time.    Labs on Admission: I have personally reviewed following labs and imaging studies  CBC: Recent Labs  Lab 09/04/20 0532 09/05/20 0442 09/07/20 0630 09/10/20 1740  WBC 12.2* 7.8 6.7 15.2*  NEUTROABS  --   --   --  14.0*  HGB 8.9* 8.0* 7.7* 7.5*  HCT 26.7* 24.1* 23.7* 23.4*  MCV 96.0 97.2 100.4* 100.9*  PLT  105* 95* 136* 765    Basic Metabolic Panel: Recent Labs  Lab 09/04/20 0532 09/05/20 0442 09/06/20 0631 09/07/20 0630 09/10/20 1737  NA 134* 134*  --   --  149*  K 4.8 4.6  --   --  5.0  CL 105 106  --   --  118*  CO2 23 20*  --   --  21*  GLUCOSE 114* 89  --   --  124*  BUN 31* 29*  --   --  48*  CREATININE 1.28* 1.20  --   --  1.47*  CALCIUM 7.8* 7.9*  --   --  8.3*  MG 1.8 1.8 2.0 2.3  --     GFR: Estimated Creatinine Clearance: 46.3 mL/min (A) (by C-G formula based on SCr of 1.47 mg/dL (H)).  Liver Function Tests: Recent Labs  Lab 09/10/20 1737  AST 63*  ALT 69*  ALKPHOS 79  BILITOT 2.5*  PROT 5.7*  ALBUMIN 2.5*    Urine analysis:    Component Value Date/Time   COLORURINE YELLOW (A) 09/02/2020 1136   APPEARANCEUR HAZY (A) 09/02/2020 1136   LABSPEC 1.018 09/02/2020 1136   PHURINE 6.0 09/02/2020 1136   GLUCOSEU NEGATIVE 09/02/2020 1136   HGBUR LARGE (A) 09/02/2020 1136   BILIRUBINUR NEGATIVE 09/02/2020 1136   BILIRUBINUR negative 01/17/2020 1227   KETONESUR  NEGATIVE 09/02/2020 1136   PROTEINUR 30 (A) 09/02/2020 1136   UROBILINOGEN 0.2 01/17/2020 1227   NITRITE POSITIVE (A) 09/02/2020 1136   LEUKOCYTESUR MODERATE (A) 09/02/2020 1136    Radiological Exams on Admission: DG Chest Port 1 View  Result Date: 09/10/2020 CLINICAL DATA:  Sepsis EXAM: PORTABLE CHEST 1 VIEW COMPARISON:  09/02/2020 FINDINGS: Single frontal view of the chest demonstrates stable postsurgical changes from CABG. The cardiac silhouette is stable. There is diffuse bilateral airspace disease, right greater than left. No effusion or pneumothorax. Age-indeterminate right posterolateral sixth and seventh rib fractures. No other acute bony abnormalities. IMPRESSION: 1. Interval development of diffuse bilateral airspace disease, right greater than left, compatible with infection or asymmetric edema. 2. Age-indeterminate right posterolateral sixth and seventh rib fractures. Electronically Signed    By: Randa Ngo M.D.   On: 09/10/2020 18:02    EKG: Not yet available for review.  Assessment/Plan Principal Problem:   Severe sepsis (HCC) Active Problems:   Hx of CABG x 5 '06   Dyslipidemia   Essential hypertension   Chronic kidney disease (CKD), stage III (moderate) (HCC)   Prostate CA (HCC)   Longstanding persistent atrial fibrillation (HCC)   Generalized weakness   Physical deconditioning   Low back pain   GERD (gastroesophageal reflux disease)   Iron deficiency anemia   History of UTI   Closed left hip fracture (HCC)   AF (paroxysmal atrial fibrillation) (HCC)   Acute respiratory failure with hypoxia (HCC)  Severe sepsis Acute hypoxic respiratory failure > Patient meets criteria for severe sepsis with leukocytosis to 15, T-max 1-1.4, respiratory rate into the 20s, lactic acid elevated to 3.3. > Source suspected to be pneumonia given diffuse bilateral airspace disease which is new, however he he has had recent hip replacement and hardware could be a possible nidus.  We will follow up cultures and response to treatment > CTA PE ordered in ED to rule out PE given recent surgery, however this seems less likely as he is chronically on Eliquis. > Initially saturating 77% on room air, requiring 10 to 15 L nonrebreather to maintain saturation in the 90s in the ED. > Status post 1 L of IV fluids in the ED and started on a rate of 150 cc/h - Monitor in progressive unit - Continue vancomycin, cefepime, Flagyl - Trend fever curve and white count - Follow-up urinalysis, urine culture, blood culture - Continue to trend lactic acid to monitor response to initial fluid administration - Delirium precautions  Physical deconditioning Recent admission for left hip fracture status post left knee arthroplasty > Has been taking his Eliquis for anticoagulation however given his recent surgery CTA was ordered in ED and is pending. - Continue PT - Monitor results of CTA  CKD  2 Hypovolemic hypernatremia > Creatinine borderline elevated at 1.47 from apparent baseline of around 1.2. > Sodium elevated to 149, patient appears volume down with mild elevation creatinine as above, will monitor response to IV fluids and encourage p.o. free water intake. - Avoid nephrotoxic agents - Trend renal function electrolytes - Continue IV fluids as above  Transaminitis > AST 63, ALT 69, this is improved from previous.  T bili of 2.5 is new.  Suspect this to be in the setting of severe sepsis. - Continue to monitor for now  A. Fib - Continue home amiodarone and Eliquis (unless CTA is positive and will switch to heparin)  Hypertension Hyperlipidemia CAD status post CABG x5 in 2006 > Blood pressure stable in  the 102V systolic in the ED - Continue home rosuvastatin  GERD - Continue PPI  Iron deficiency anemia > History of iron deficiency anemia on iron replacement however CBC shows macrocytosis we will check B12 and folate > Hemoglobin is currently stable at 7.5 - Continue iron supplementation  Chronic low back pain - Continue home tramadol as needed  Prostate cancer - Continue home darolutamide  DVT prophylaxis: On Eliquis at home, will plan to continue this pending results of CTA which has been ordered.   Code Status:   DNR  Family Communication:  Son updated at bedside  Disposition Plan:   Patient is from:  [place] to:  Same as above  Anticipated DC date:  2 to 7 days  Anticipated DC barriers: None  Consults called:  None  Admission status:  Inpatient, progressive   Severity of Illness: The appropriate patient status for this patient is INPATIENT. Inpatient status is judged to be reasonable and necessary in order to provide the required intensity of service to ensure the patient's safety. The patient's presenting symptoms, physical exam findings, and initial radiographic and laboratory data in the context of  their chronic comorbidities is felt to place them at high risk for further clinical deterioration. Furthermore, it is not anticipated that the patient will be medically stable for discharge from the hospital within 2 midnights of admission. The following factors support the patient status of inpatient.   " The patient's presenting symptoms include fever, shortness of breath. " The worrisome physical exam findings include tired appearing, mild increased work of breathing, diffuse rhonchi. " The initial radiographic and laboratory data are worrisome because of diffuse bilateral airspace disease, hyponatremia 149, creatinine 1.47 from baseline 1.2, hemoglobin stable at 7.5, leukocytosis to 15, lactic acid elevated at 3.3 with repeat pending.. " The chronic co-morbidities include AAA, A. fib, CKD, CAD, hyperlipidemia, hypertension, GERD, anemia, prostate cancer.   * I certify that at the point of admission it is my clinical judgment that the patient will require inpatient hospital care spanning beyond 2 midnights from the point of admission due to high intensity of service, high risk for further deterioration and high frequency of surveillance required.Marcelyn Bruins MD [facility]  How to contact the [facility] Attending or Consulting provider [place] or covering provider during after hours [place], for this patient?   1. Check the care team in [facility] and look for a) attending/consulting [facility] provider listed and b) the [facility] team listed 2. Log into [email or web address] and use [place]'s universal password to access. If you do not have the password, please contact the hospital operator. 3. Locate the [facility] provider you are looking for under [facility] and page to a number that you can be directly reached. 4. If you still have difficulty reaching the provider, please page the [facility] (Director on Call) for the Hospitalists listed on amion for assistance.  09/10/2020, 8:19 PM

## 2020-09-10 NOTE — Progress Notes (Signed)
CTA returned without any signs of PE, pneumonia confirmed with suspicion for aspiration. - We will resume home Eliquis - Change diet to dysphagia 3 diet and SLP consult placed

## 2020-09-10 NOTE — ED Triage Notes (Signed)
PT from [facility] where he was receiving rehab for left hip replacement. Pt had initial o2 sats of 77 on RA and a temp of 101.4. Denies pain. A/O x 4. Arrives on NRB 10L with sat of 97

## 2020-09-11 DIAGNOSIS — R6520 Severe sepsis without septic shock: Secondary | ICD-10-CM | POA: Diagnosis not present

## 2020-09-11 DIAGNOSIS — A419 Sepsis, unspecified organism: Secondary | ICD-10-CM | POA: Diagnosis not present

## 2020-09-11 LAB — URINALYSIS, COMPLETE (UACMP) WITH MICROSCOPIC
Bilirubin Urine: NEGATIVE
Glucose, UA: NEGATIVE mg/dL
Ketones, ur: NEGATIVE mg/dL
Nitrite: POSITIVE — AB
Protein, ur: 30 mg/dL — AB
RBC / HPF: >50 RBC/hpf — ABNORMAL HIGH (ref 0–5)
Specific Gravity, Urine: 1.018 (ref 1.005–1.030)
Squamous Epithelial / HPF: NONE SEEN (ref 0–5)
WBC, UA: >50 WBC/hpf — ABNORMAL HIGH (ref 0–5)
pH: 5 (ref 5.0–8.0)

## 2020-09-11 LAB — COMPREHENSIVE METABOLIC PANEL
ALT: 55 U/L — ABNORMAL HIGH (ref 0–44)
AST: 40 U/L (ref 15–41)
Albumin: 2.3 g/dL — ABNORMAL LOW (ref 3.5–5.0)
Alkaline Phosphatase: 73 U/L (ref 38–126)
Anion gap: 9 (ref 5–15)
BUN: 50 mg/dL — ABNORMAL HIGH (ref 8–23)
CO2: 22 mmol/L (ref 22–32)
Calcium: 7.8 mg/dL — ABNORMAL LOW (ref 8.9–10.3)
Chloride: 117 mmol/L — ABNORMAL HIGH (ref 98–111)
Creatinine, Ser: 1.42 mg/dL — ABNORMAL HIGH (ref 0.61–1.24)
GFR, Estimated: 49 mL/min — ABNORMAL LOW (ref >60)
Glucose, Bld: 109 mg/dL — ABNORMAL HIGH (ref 70–99)
Potassium: 3.3 mmol/L — ABNORMAL LOW (ref 3.5–5.1)
Sodium: 148 mmol/L — ABNORMAL HIGH (ref 135–145)
Total Bilirubin: 1.7 mg/dL — ABNORMAL HIGH (ref 0.3–1.2)
Total Protein: 5.1 g/dL — ABNORMAL LOW (ref 6.5–8.1)

## 2020-09-11 LAB — CBC
HCT: 24 % — ABNORMAL LOW (ref 39.0–52.0)
Hemoglobin: 7.6 g/dL — ABNORMAL LOW (ref 13.0–17.0)
MCH: 32.1 pg (ref 26.0–34.0)
MCHC: 31.7 g/dL (ref 30.0–36.0)
MCV: 101.3 fL — ABNORMAL HIGH (ref 80.0–100.0)
Platelets: 215 10*3/uL (ref 150–400)
RBC: 2.37 MIL/uL — ABNORMAL LOW (ref 4.22–5.81)
RDW: 17.1 % — ABNORMAL HIGH (ref 11.5–15.5)
WBC: 15.2 10*3/uL — ABNORMAL HIGH (ref 4.0–10.5)
nRBC: 0 % (ref 0.0–0.2)

## 2020-09-11 LAB — PROTIME-INR
INR: 2.4 — ABNORMAL HIGH (ref 0.8–1.2)
Prothrombin Time: 25.7 seconds — ABNORMAL HIGH (ref 11.4–15.2)

## 2020-09-11 LAB — VITAMIN B12: Vitamin B-12: 425 pg/mL (ref 180–914)

## 2020-09-11 LAB — PROCALCITONIN: Procalcitonin: 0.87 ng/mL

## 2020-09-11 LAB — CORTISOL-AM, BLOOD: Cortisol - AM: 30 ug/dL — ABNORMAL HIGH (ref 6.7–22.6)

## 2020-09-11 MED ORDER — MORPHINE SULFATE (PF) 2 MG/ML IV SOLN
0.5000 mg | INTRAVENOUS | Status: DC | PRN
  Administered 2020-09-11 – 2020-09-18 (×10): 0.5 mg via INTRAVENOUS
  Filled 2020-09-11 (×11): qty 1

## 2020-09-11 MED ORDER — SODIUM CHLORIDE 0.45 % IV SOLN: INTRAVENOUS | Status: DC

## 2020-09-11 MED ORDER — ENOXAPARIN SODIUM 100 MG/ML ~~LOC~~ SOLN
1.0000 mg/kg | Freq: Two times a day (BID) | SUBCUTANEOUS | Status: DC
  Administered 2020-09-11 – 2020-09-13 (×4): 85 mg via SUBCUTANEOUS
  Filled 2020-09-11 (×5): qty 1

## 2020-09-11 NOTE — Progress Notes (Signed)
[place] [facility] [place] NAME: Jeromiah Ohalloran    MR#:  [id number]  DATE OF BIRTH:  July 24, 1936  SUBJECTIVE:   Patient was discharged on February 11 to [facility] after hip fracture surgery. He had speech evaluation done and was recommending dysphagia diet came in with respiratory distress, hypoxia and found to have aspiration pneumonia.   Patient's son in the room. Was feeding ice chips. Evaluated by speech therapist earlier. NPO currently. Patient has lot of gurgling sound unable to protect his airway. Constantly keeps coughing even during talking. REVIEW OF SYSTEMS:   Review of Systems  Unable to perform ROS: Medical condition   Tolerating Diet:NPO Tolerating PT:   DRUG ALLERGIES:   Allergies  Allergen Reactions  . Ciprofloxacin Other (See Comments)    PER HEART DR  . Ibuprofen Other (See Comments)    Kidney issues    VITALS:  Blood pressure (!) 105/59, pulse 85, temperature 98.4 F (36.9 C), temperature source Oral, resp. rate (!) 24, height 6\' 3"  (1.905 m), weight 85 kg, SpO2 99 %.  PHYSICAL EXAMINATION:   Physical Examlimited exam  GENERAL:  85 y.o.-year-old patient lying in the bed with mild to moderate acute distress.  HEENT: Head atraumatic, normocephalic. Oropharynx and nasopharynx clear. Poor dentition LUNGS: coarse breath sounds bilaterally, no wheezing, rales, + rhonchi. Intermittent use of accessory muscles of respiration.  CARDIOVASCULAR: S1, S2 normal. No murmurs, rubs, or gallops. Tachycardia+ ABDOMEN: Soft, nontender, nondistended. Bowel sounds present. No organomegaly or mass.  EXTREMITIES: No cyanosis, clubbing or edema b/l.    NEUROLOGIC: grossly nonfocal moves all extremities well. Very weak and deconditioned PSYCHIATRIC:  patient is lethargic however does answer questions when asked my son SKIN:  Pressure Injury 10/09/18 Stage II -  Partial thickness loss of dermis presenting as a shallow open ulcer with a  red, pink wound bed without slough. pt had this wound from home per his report; chronic partial thickness wounds related to shear, not pressur (Active)  10/09/18 0730  Location: Buttocks  Location Orientation: Left;Right  Staging: Stage II -  Partial thickness loss of dermis presenting as a shallow open ulcer with a red, pink wound bed without slough.  Wound Description (Comments): pt had this wound from home per his report; chronic partial thickness wounds related to shear, not pressure  Present on Admission: Yes        LABORATORY PANEL:  CBC Recent Labs  Lab 09/11/20 0602  WBC 15.2*  HGB 7.6*  HCT 24.0*  PLT 215    Chemistries  Recent Labs  Lab 09/07/20 0630 09/10/20 1737 09/11/20 0602  NA  --    < > 148*  K  --    < > 3.3*  CL  --    < > 117*  CO2  --    < > 22  GLUCOSE  --    < > 109*  BUN  --    < > 50*  CREATININE  --    < > 1.42*  CALCIUM  --    < > 7.8*  MG 2.3  --   --   AST  --    < > 40  ALT  --    < > 55*  ALKPHOS  --    < > 73  BILITOT  --    < > 1.7*   < > = values in this interval not displayed.   Cardiac Enzymes No results for input(s):  TROPONINI in the last 168 hours. RADIOLOGY:  CT Angio Chest PE W and/or Wo Contrast  Result Date: 09/10/2020 CLINICAL DATA:  Chest pain or SOB, pleurisy or effusion suspected SOB, recent hip surgery eval for pE EXAM: CT ANGIOGRAPHY CHEST WITH CONTRAST TECHNIQUE: Multidetector CT imaging of the chest was performed using the standard protocol during bolus administration of intravenous contrast. Multiplanar CT image reconstructions and MIPs were obtained to evaluate the vascular anatomy. CONTRAST:  34mL OMNIPAQUE IOHEXOL 350 MG/ML SOLN COMPARISON:  Chest radiograph earlier today.  Chest CT 06/18/2018 FINDINGS: Cardiovascular: There are no filling defects within the pulmonary arteries to suggest pulmonary embolus. Subsegmental branches are not well assessed due to contrast bolus timing and motion. There is dilatation of  the main pulmonary artery at 3.5 cm. Cardiomegaly post CABG. Calcification of native coronary arteries. Aortic atherosclerosis. No aneurysm or dissection. Left vertebral artery arises directly from the thoracic aorta, variant anatomy. Abnormal left upper lobe venous drainage into the brachiocephalic confluence. Mediastinum/Nodes: Scattered intraluminal fluid in the esophagus, small hiatal hernia. Assessment for right hilar adenopathy is limited due to adjacent airspace disease. There are multiple small mediastinal lymph nodes are not enlarged by size criteria. No thyroid nodule. Lungs/Pleura: Dense airspace consolidation in the dependent right upper lobe, dependent right middle lobe, dependent right lower lobe. Dense but lesser consolidation in the dependent left lower lobe. Right bronchus intermedius is completely collapsed or occluded, motion obscures detailed assessment. Right lower lobe bronchus appears filled. There additional patchy tree in bud and ground-glass opacities in the more anterior right lung. No significant pleural fluid. No findings of pulmonary edema. Upper Abdomen: Left renal atrophy with large stones and upper pole cyst, partially included but unchanged from prior abdominal CT. Calcified splenic granuloma. No acute findings in the upper abdomen. Musculoskeletal: Bones diffusely under mineralized. Median sternotomy. Remote L1 compression fracture unchanged from prior. Bridging osteophytes throughout the thoracic spine. No acute osseous abnormalities. Review of the MIP images confirms the above findings. IMPRESSION: 1. No pulmonary embolus. 2. Multifocal airspace disease consistent with pneumonia, head suspicious for aspiration. Dense airspace consolidation in the dependent right upper, middle and lower lobes. Dense but lesser consolidation in the dependent left lower lobe. Right bronchus intermedius is completely collapsed or occluded, motion obscures detailed assessment. Right lower lobe  bronchus appears filled. Additional patchy tree in bud and ground-glass opacities in the more anterior right lung. 3. Dilatation of the main pulmonary artery suggesting pulmonary arterial hypertension. 4. Aortic atherosclerosis without acute aortic finding. Mild cardiomegaly post CABG. Aortic Atherosclerosis (ICD10-I70.0). Electronically Signed   By: Keith Rake M.D.   On: 09/10/2020 20:32   DG Chest Port 1 View  Result Date: 09/10/2020 CLINICAL DATA:  Sepsis EXAM: PORTABLE CHEST 1 VIEW COMPARISON:  09/02/2020 FINDINGS: Single frontal view of the chest demonstrates stable postsurgical changes from CABG. The cardiac silhouette is stable. There is diffuse bilateral airspace disease, right greater than left. No effusion or pneumothorax. Age-indeterminate right posterolateral sixth and seventh rib fractures. No other acute bony abnormalities. IMPRESSION: 1. Interval development of diffuse bilateral airspace disease, right greater than left, compatible with infection or asymmetric edema. 2. Age-indeterminate right posterolateral sixth and seventh rib fractures. Electronically Signed   By: Randa Ngo M.D.   On: 09/10/2020 18:02   ASSESSMENT AND PLAN:  BERNABE DORCE is a 85 y.o. male with medical history significant of recent admission for left hip fracture status post left knee arthroplasty, AAA, A. fib, CKD 2, CAD status post CABG x5  in 2006, hyperlipidemia, hypertension, GERD, iron deficiency anemia, orthostatic hypotension, prostate cancer, HFpEF who presents from his rehab facility with new oxygen requirement and fevers.  Severe sepsis Acute hypoxic respiratory failure Aspiration pneumonia right more than left --Patient meets criteria for severe sepsis with leukocytosis to 15K , T-max 101.4, respiratory rate into the 20s, lactic acid elevated to 3.3. --Source suspected to be pneumonia given diffuse bilateral airspace disease which is new -- CTA chest negative for PE. Shows bilateral  pneumonia -- Initially saturating 77% on room air, requiring 10 to 15 L nonrebreather to maintain saturation in the 90s in the ED. - Continue  cefepime, Flagyl -blood cultures negative - Continue to trend lactic acid to monitor response to initial fluid administration  Dysphagia --patient was seen by speech therapist. Recommends patient to be NPO. Very high risk for aspiration.  Physical deconditioning Recent admission for left hip fracture status post left knee arthroplasty - Continue PT as tolerated  CKD 2 Hypovolemic hypernatremia -- Creatinine borderline elevated at 1.47 from apparent baseline of around 1.2. -- Sodium elevated to 149, patient appears volume down with mild elevation creatinine as above, will monitor response to IV fluids and encourage p.o. free water intake. - Avoid nephrotoxic agents - Continue IV fluids with half normal saline  Transaminitis -- AST 63, ALT 69, this is improved from previous.  T bili of 2.5 is new.  Suspect this to be in the setting of severe sepsis. - Continue to monitor for now  A. Fib - home amiodarone and Eliquis  -- patient's heart rate is stable. Holding of amiodarone since he is NPO. Will switch eliquis to Lovenox 1 mg per kilogram subcu BID. This was discussed with pharmacist -- patient's hemoglobin remains anywhere from 7-4 to 8.3.   Hypertension Hyperlipidemia CAD status post CABG x5 in 2006 -- Blood pressure stable  But soft  GERD -on  PPI  Iron deficiency anemia -Hemoglobin is currently stable at 7.5 - Continue iron supplementation  Chronic low back pain -prn IV morphine since pt is NPO  Prostate cancer - on  home darolutamide (holding --NPO)   Had a long discussion with patient's son in the room. He understands patient has a poor prognosis. Patient is DNR. Son wants to discuss with other family members. I'll get palliative care on board.   DVT prophylaxis:      lovnox Code Status:              DNR  Family  Communication:       Son updated at bedside  Disposition Plan:              Patient is from:                        [facility] skilled nursing facility             Anticipated DC to:                   TBD             Anticipated DC date:              TBD             Anticipated DC barriers:         None    Consults called:        None  Admission status:     Inpatient, progressive    TOTAL TIME TAKING  CARE OF THIS PATIENT: *30 minutes.  >50% time spent on counselling and coordination of care  Note: This dictation was prepared with Dragon dictation along with smaller phrase technology. Any transcriptional errors that result from this process are unintentional.  Fritzi Mandes M.D    [facility]   CC: Primary care physician; [name], MDPatient ID: [name], male   DOB: 04-May-1936, 85 y.o.   MRN: [id number]

## 2020-09-11 NOTE — Progress Notes (Signed)
ANTICOAGULATION CONSULT NOTE - Initial Consult  Pharmacy Consult for Enoxaparin Indication: transition from Apixaban to Enoxaparin for Afib  Allergies  Allergen Reactions  . Ciprofloxacin Other (See Comments)    PER HEART DR  . Ibuprofen Other (See Comments)    Kidney issues    Patient Measurements: Height: 6\' 3"  (190.5 cm) Weight: 85 kg (187 lb 6.3 oz) IBW/kg (Calculated) : 84.5 Heparin Dosing Weight:    Vital Signs: Temp: 98.4 F (36.9 C) (02/15 1156) Temp Source: Oral (02/15 1156) BP: 105/59 (02/15 1156) Pulse Rate: 85 (02/15 1156)  Labs: Recent Labs    09/10/20 1737 09/10/20 1740 09/11/20 0602  HGB  --  7.5* 7.6*  HCT  --  23.4* 24.0*  PLT  --  199 215  APTT  --  54*  --   LABPROT  --  26.5* 25.7*  INR  --  2.5* 2.4*  CREATININE 1.47*  --  1.42*    Estimated Creatinine Clearance: 47.9 mL/min (A) (by C-G formula based on SCr of 1.42 mg/dL (H)).   Medical History: Past Medical History:  Diagnosis Date  . (HFpEF) heart failure with preserved ejection fraction ([place])    a. 08/2016 Echo: EF 50-55%.  [name] AAA (abdominal aortic aneurysm) ([place]) 06/05/2019   abdominal aortic aneurysm measuring 3.1 cm.  . Arthritis    arms  . Bladder calculus   . Chronic anemia   . Chronic anxiety   . CKD (chronic kidney disease), stage III ([place])   . Coronary artery disease CARDIOLOGIST-  DR Rockey Situ   a. CABG 2006 with  LIMA-LAD, SVG-[place]amus, SVG-OM, seq SVG-PLV-PDA.  . DOE (dyspnea on exertion)   . Frequent falls 11/2019  . GERD (gastroesophageal reflux disease)   . Heart murmur   . Hematuria   . Hip fracture ([place]) 10/05/2018  . History of bladder stone   . History of colon polyps   . History of non-ST elevation myocardial infarction (NSTEMI) 03/30/2005   post op recovery inguinal hernia repair  . History of pressure ulcer   . History of recurrent UTIs    last admission 08-04-2018 pseudomonas UTI  . Hyperlipidemia   . Hyperplasia of prostate without lower urinary tract  symptoms (LUTS)   . Hypertension   . Indwelling Foley catheter present   . Intracranial hematoma ([place]) 10/05/2018  . Ischemic cardiomyopathy    a. echo (01/13) 45-50% septal akinesis, mild MR; b. Echo  (01/16) ef 60-65%, mid-apical anteroseptal hypokinesis; c. echo 09-16-2016 ef 50-55%  . Left ureteral stone   . Lung nodule    noted 08/ 2018;  followed by pcp  . LVH (left ventricular hypertrophy) 09/16/2016   Moderate noted on ECHO  . Moderate mitral regurgitation    a.08/2016 Echo: Mod MR/TR.  [name] Orthostatic hypotension   . PAC (premature atrial contraction)   . Paroxysmal atrial flutter ([place])    a. dx 08/2016, started on amiodarone.  . Persistent atrial fibrillation [facility]) followed by cardiologist-- dr Rockey Situ   a. dx AFib 08/2016 s/p DCCV 10/2016-->eliquis (CHA2DS2VASc = 5).  . Pneumonia   . Pulmonary hypertension ([place])    last echo in epic 09-16-2016  . Recurrent prostate cancer ([place]) UROLOGIST-- DR BELL   s/p  radioactive prostate seed implants in 1997;    due to PSA rising pt has been doing lupron injection's few past several yrs at dr bell office  . Renal calculi    bilateral per CT 06-18-2018  . S/P CABG x 5 04/04/2005   LIMA  to LAD,  SVG to Ramus,  SVG to OM,  seqSVG to PLV and PDA  . Wears glasses     Assessment: Patient is a 85 yo male with a history of afib on apixaban 5mg  bid. Last dose was on 2/15 ~midnight. Patient is now being made strict NPO and pharmacy is consulted to transition patient to Enoxaprin  Goal of Therapy:  Monitor platelets by anticoagulation protocol: Yes   Plan:  Will order Enoxaparin 85mg  (1mg /kg) SQ q12h to begin 2/15 at 12:00. Will follow CBC per protocol.  Paulina Fusi, PharmD, BCPS 09/11/2020 4:25 PM

## 2020-09-11 NOTE — Plan of Care (Signed)

## 2020-09-11 NOTE — Telephone Encounter (Signed)
Current admit 

## 2020-09-11 NOTE — Evaluation (Signed)
Physical Therapy Evaluation Patient Details Name: Carl [name] MRN: [id number] DOB: 07-Aug-1935 Today's Date: 09/11/2020   History of Present Illness  Carl [name] is a 85 y.o. male with medical history significant for paroxysmal atrial fibrillation on anticoagulation therapy with Eliquis, chronic diastolic dysfunction CHF, abdominal aortic aneurysm, stage III chronic kidney disease and coronary artery disease who presents to the ER via EMS for evaluation following a fall at home.  Patient states that he tripped and fell landing on his left side.  He denies having any dizziness or lightheadedness prior to the fall and denies any loss of consciousness.  He hit his head and upon presentation to the ER complained of neck pain and pain in his left hip.  He rates his left hip pain a 10 x 10 in intensity at its worst, nonradiating and worse with any form of movement.  Patient was unable to bear weight on the left leg due to pain. S/P L hemiarthroplasty.     Clinical Impression  Pt received in Semi-Fowler's position and agreeable to therapy.  Pt is difficult to understand due to dryness of mouth and difficulty with tongue movement.  Pt is able to communicate and is oriented to person, time, place, and year which is improvement from previous hospital stay.  Nursing notified therapist of difficulty with swallowing and SLP orders were placed during PT evaluation.  Pt was able to perform bed level exercises with delayed response, but follows one-step commands well.  Pt has decreased muscle strength as noted from inability to perform with sustained contraction.  Pt required some active assistance with certain movements.  Therapist provided maxA for transferring to sitting EOB.  Pt demonstrates R lateral lean in sitting due to the pain on the L hip.  Pt requested to go back to supine position and required maxA for achieving that position.  After speaking with SLP following session/evaluation, palliative care  has been requested for pt.  Pt will continue to be monitored by PT and seen as medically appropriate.  Pt will benefit from skilled PT intervention to increase independence and safety with basic mobility in preparation for discharge to the venue listed below.        Follow Up Recommendations SNF    Equipment Recommendations   (defer to next level of care)    Recommendations for Other Services       Precautions / Restrictions Precautions Precautions: Posterior Hip;Fall Precaution Booklet Issued: No Precaution Comments: hip abduction pillow Restrictions Weight Bearing Restrictions: Yes LLE Weight Bearing: Partial weight bearing      Mobility  Bed Mobility Overal bed mobility: Needs Assistance Bed Mobility: Supine to Sit;Sit to Supine     Supine to sit: Max assist Sit to supine: Max assist   General bed mobility comments: Increased time to perform. does required max vcs and assist to achieve EOB sitting    Transfers                    Ambulation/Gait                Stairs            Wheelchair Mobility    Modified Rankin (Stroke Patients Only)       Balance Overall balance assessment: Needs assistance Sitting-balance support: Feet supported Sitting balance-Leahy Scale: Poor Sitting balance - Comments: Extreme R lateral lean 2/2 to pain       Standing balance comment: pt declined attempt to stand  Pertinent Vitals/Pain Pain Assessment: Faces Faces Pain Scale: Hurts whole lot Pain Location: grimace with repositioning Pain Descriptors / Indicators: Grimacing Pain Intervention(s): Limited activity within patient's tolerance;Monitored during session;Premedicated before session;Repositioned    Home Living Family/patient expects to be discharged to:: Skilled nursing facility Living Arrangements: Children               Additional Comments: patient's daughter reports he does sponge bathing     Prior Function Level of Independence: Needs assistance   Gait / Transfers Assistance Needed: uses walker at baseline  ADL's / Homemaking Assistance Needed: pt reports his family was assisting with ADL;pt is sponge bathing due to fear of falling  Comments: taken from previous encounter 7 days ago.     Hand Dominance   Dominant Hand: Right    Extremity/Trunk Assessment   Upper Extremity Assessment Upper Extremity Assessment: Generalized weakness    Lower Extremity Assessment Lower Extremity Assessment: Generalized weakness LLE Sensation: WNL LLE Coordination: decreased gross motor    Cervical / Trunk Assessment Cervical / Trunk Assessment: Normal  Communication   Communication: Expressive difficulties  Cognition Arousal/Alertness: Awake/alert Behavior During Therapy: WFL for tasks assessed/performed (much less confused this date.) Overall Cognitive Status: Within Functional Limits for tasks assessed                     Current Attention Level: Focused Memory: Decreased recall of precautions;Decreased short-term memory Following Commands: Follows one step commands inconsistently Safety/Judgement: Decreased awareness of safety;Decreased awareness of deficits   Problem Solving: Slow processing;Decreased initiation;Difficulty sequencing;Requires verbal cues;Requires tactile cues General Comments: Pt is awake throughout session + presenting with much improved cognition. still presenting with cognition different than that of baseline. was able to follow simple one step commands with increased time      General Comments      Exercises Total Joint Exercises Ankle Circles/Pumps: AROM;Strengthening;Both;10 reps;Supine Quad Sets: AROM;Strengthening;Both;10 reps;Supine Gluteal Sets: AROM;Strengthening;Both;10 reps;Supine Knee Flexion: AROM;Strengthening;Both;10 reps;Supine   Assessment/Plan    PT Assessment Patient needs continued PT services  PT Problem List  Decreased strength;Decreased mobility;Decreased activity tolerance;Decreased balance;Pain;Decreased knowledge of precautions       PT Treatment Interventions DME instruction;Therapeutic activities;Gait training;Therapeutic exercise;Patient/family education;Balance training;Functional mobility training    PT Goals (Current goals can be found in the Care Plan section)  Acute Rehab PT Goals Patient Stated Goal: to go to rehab (from previous eval) PT Goal Formulation: With patient Time For Goal Achievement: 09/18/20 Potential to Achieve Goals: Good    Frequency 7X/week   Barriers to discharge        Co-evaluation               AM-PAC PT "6 Clicks" Mobility  Outcome Measure Help needed turning from your back to your side while in a flat bed without using bedrails?: A Lot Help needed moving from lying on your back to sitting on the side of a flat bed without using bedrails?: A Lot Help needed moving to and from a bed to a chair (including a wheelchair)?: Total Help needed standing up from a chair using your arms (e.g., wheelchair or bedside chair)?: Total Help needed to walk in hospital room?: Total Help needed climbing 3-5 steps with a railing? : Total 6 Click Score: 8    End of Session Equipment Utilized During Treatment: Oxygen Activity Tolerance:  (limited by cognition/ inability to properly maintain PWB) Patient left: in bed;with bed alarm set;with call bell/phone within reach Nurse Communication: Mobility status PT Visit  Diagnosis: Muscle weakness (generalized) (M62.81);Other abnormalities of gait and mobility (R26.89);History of falling (Z91.81);Pain Pain - Right/Left: Left Pain - part of body: Leg    Time: 5953-9672 PT Time Calculation (min) (ACUTE ONLY): 27 min   Charges:   PT Evaluation $PT Eval Low Complexity: 1 Low PT Treatments $Therapeutic Exercise: 23-37 mins        Gwenlyn Saran, PT, DPT 09/11/20, 1:52 PM

## 2020-09-11 NOTE — Progress Notes (Signed)
[facility]) Hospital Liaison RN note:  This patient is currently followed by out patient based palliative care with [facility]. Doran Clay, TOC notified. Patient was at [facility]. [facility] Liaison will follow for disposition.  Thank you.  Zandra Abts, RN [facility] Liaison  [phone]

## 2020-09-11 NOTE — Evaluation (Signed)
Clinical/Bedside Swallow Evaluation Patient Details  Name: Carl [name] MRN: [id number] Date of Birth: 11-04-1935  Today's Date: 09/11/2020 Time: SLP Start Time (ACUTE ONLY): 1100 SLP Stop Time (ACUTE ONLY): 1200 SLP Time Calculation (min) (ACUTE ONLY): 60 min  Past Medical History:  Past Medical History:  Diagnosis Date  . (HFpEF) heart failure with preserved ejection fraction ([place])    a. 08/2016 Echo: EF 50-55%.  [name] AAA (abdominal aortic aneurysm) ([place]) 06/05/2019   abdominal aortic aneurysm measuring 3.1 cm.  . Arthritis    arms  . Bladder calculus   . Chronic anemia   . Chronic anxiety   . CKD (chronic kidney disease), stage III ([place])   . Coronary artery disease CARDIOLOGIST-  DR Rockey Situ   a. CABG 2006 with  LIMA-LAD, SVG-Ramus, SVG-OM, seq SVG-PLV-PDA.  . DOE (dyspnea on exertion)   . Frequent falls 11/2019  . GERD (gastroesophageal reflux disease)   . Heart murmur   . Hematuria   . Hip fracture ([place]) 10/05/2018  . History of bladder stone   . History of colon polyps   . History of non-ST elevation myocardial infarction (NSTEMI) 03/30/2005   post op recovery inguinal hernia repair  . History of pressure ulcer   . History of recurrent UTIs    last admission 08-04-2018 pseudomonas UTI  . Hyperlipidemia   . Hyperplasia of prostate without lower urinary tract symptoms (LUTS)   . Hypertension   . Indwelling Foley catheter present   . Intracranial hematoma ([place]) 10/05/2018  . Ischemic cardiomyopathy    a. echo (01/13) 45-50% septal akinesis, mild MR; b. Echo  (01/16) ef 60-65%, mid-apical anteroseptal hypokinesis; c. echo 09-16-2016 ef 50-55%  . Left ureteral stone   . Lung nodule    noted 08/ 2018;  followed by pcp  . LVH (left ventricular hypertrophy) 09/16/2016   Moderate noted on ECHO  . Moderate mitral regurgitation    a.08/2016 Echo: Mod MR/TR.  [name] Orthostatic hypotension   . PAC (premature atrial contraction)   . Paroxysmal atrial flutter ([place])    a. dx  08/2016, started on amiodarone.  . Persistent atrial fibrillation [facility]) followed by cardiologist-- dr Rockey Situ   a. dx AFib 08/2016 s/p DCCV 10/2016-->eliquis (CHA2DS2VASc = 5).  . Pneumonia   . Pulmonary hypertension ([place])    last echo in epic 09-16-2016  . Recurrent prostate cancer ([place]) UROLOGIST-- DR BELL   s/p  radioactive prostate seed implants in 1997;    due to PSA rising pt has been doing lupron injection's few past several yrs at dr bell office  . Renal calculi    bilateral per CT 06-18-2018  . S/P CABG x 5 04/04/2005   LIMA to LAD,  SVG to Ramus,  SVG to OM,  seqSVG to PLV and PDA  . Wears glasses    Past Surgical History:  Past Surgical History:  Procedure Laterality Date  . CARDIAC CATHETERIZATION  04/02/2005   dr Doreatha Lew    Critical three-vessel coronary disease --  Essentially normal left ventricular function , ef 55%  . CARDIOVASCULAR STRESS TEST  05-01-2008  dr Doreatha Lew /  dr harding   Low risk nuclear study w/ no ischemia or scar but flattened septal motion/  ef 64%  . CARDIOVERSION N/A 11/19/2016   Procedure: CARDIOVERSION;  Surgeon: Jerline Pain, MD;  Location: [place];  Service: Cardiovascular;  Laterality: N/A;  . COLONOSCOPY N/A 10/23/2016   Procedure: COLONOSCOPY;  Surgeon: Milus Banister, MD;  Location: [facility];  Service: Endoscopy;  Laterality: N/A;  . CORONARY ARTERY BYPASS GRAFT  04/04/2005   dr  Ricard Dillon   LIMA - LAD,  SVG - Ramus, SVG - OM,  seqSVG - PLV and PDA  . CYSTOSCOPY WITH LITHOLAPAXY N/A 05/07/2016   Procedure: CYSTOSCOPY WITH LITHOLAPAXY;  Surgeon: Rana Snare, MD;  Location: [facility];  Service: Urology;  Laterality: N/A;  . CYSTOSCOPY WITH RETROGRADE PYELOGRAM, URETEROSCOPY AND STENT PLACEMENT  09/03/2018   Procedure: CYSTOSCOPY WITH RETROGRADE PYELOGRAM, URETEROSCOPY LASER LITHOTRIPSY AND STENT PLACEMENT;  Surgeon: Lucas Mallow, MD;  Location: [facility];  Service: Urology;;  . CYSTOSCOPY/URETEROSCOPY/HOLMIUM LASER/STENT  PLACEMENT Right 06/15/2019   Procedure: CYSTOSCOPY RIGHT URETEROSCOPY HOLMIUM LASER STENT PLACEMENT;  Surgeon: Lucas Mallow, MD;  Location: [facility];  Service: Urology;  Laterality: Right;  . HIP ARTHROPLASTY Left 09/03/2020   Procedure: ARTHROPLASTY BIPOLAR HIP (HEMIARTHROPLASTY);  Surgeon: Earnestine Leys, MD;  Location: [facility];  Service: Orthopedics;  Laterality: Left;  . HOLMIUM LASER APPLICATION N/A 73/[id number]/2992   Procedure: HOLMIUM LASER APPLICATION;  Surgeon: Rana Snare, MD;  Location: [facility];  Service: Urology;  Laterality: N/A;  . INGUINAL HERNIA REPAIR Left 03-30-2005  dr Excell Seltzer   incarcerated   . RADIOACTIVE PROSTATE SEED IMPLANTS  1997  . TOTAL HIP ARTHROPLASTY Right 10/07/2018   Procedure: TOTAL HIP ARTHROPLASTY ANTERIOR APPROACH;  Surgeon: Rod Can, MD;  Location: [facility];  Service: Orthopedics;  Laterality: Right;  . TRANSTHORACIC ECHOCARDIOGRAM  08/24/2014   mid anteroseptal and distal septak hypokinetic,  mild focal basal LVH, ef 60-65%,  grade 1 diastolic dysfunction/  mild MR/  mild LAE/  mild dilated RV with normal RVSP/  trivial TR   HPI:  [name] with medical history significant for paroxysmal atrial fibrillation on anticoagulation therapy with Eliquis, chronic diastolic dysfunction CHF, abdominal aortic aneurysm, stage III chronic kidney disease and coronary artery disease.  He is currently S/P L hemiarthroplasty and receiving Rehab at facility post d/c from hospital last week.  Unsure of pt's Baseline Cognitive status prior to fall and hip surgery.  Pt was being followed by [facility] w/ Malnutrition, per chart notes.  He presents to the emergency department for shortness of breath from his rehab facility.   According to EMS report patient is currently at [facility] rehab after his left hip replacement.  Patient was noted to be short of breath by staff today and have a room air saturation of 77% and  febrile to 101.4.  Upon arrival patient does appear very fatigued and weak however he is able to answer basic questions and is oriented.  States some pain in the left hip but denies any other pain.  Patient is short of breath.  There was concern he might be developing a UTI at the facility. Chest CT: "Multifocal airspace disease consistent with pneumonia, head  suspicious for aspiration. Dense airspace consolidation in the  dependent right upper, middle and lower lobes. Dense but lesser  consolidation in the dependent left lower lobe. Right bronchus  intermedius is completely collapsed or occluded, motion obscures  detailed assessment. Right lower lobe bronchus appears filled.  Additional patchy tree in bud and ground-glass opacities in the more  anterior right lung.".   Assessment / Plan / Recommendation Clinical Impression  Pt appears to present w/ oropharyngeal phase dysphagia in light of Significantly declined Medical, Pulmonary, and Cognitive status'. Pt required Much support and intermittent cueing to direct attention  and follow through w/ po's offered. This presentation impacts his overall awareness/engagement and safety during po tasks which increases risk for aspiration, choking. Unsure of pt's full Baseline Cognitive status prior to recent admissions - he was being followed by Palliative Care services in the Home; Malnutriation present. Pt exhibited Overt s/s of aspiration c/b delayed and immediate Coughing w/ trials of thin and Nectar liquids as well as w/ Puree trials -- multiple, audible swallows were noted, wet vocal quality changes as well. Pt consumed only few trials of ice chips, thin and Nectar liquids, and purees but primarily asked for more "water". Son stated pt wanted a "Coke" also. Oral phase was c/b reduced oral awareness of po's placed in mouth, Slow bolus management and oral clearing of the boluses given, reduced labial closure on cup/straw/spoon, and a Munching pattern w/ trials of  puree. OM Exam was cursory/observation, w/ No overt unilateral weakness noted - reduced lingual backing and reduced movements at times often laying in floor of mouth during muttered speech. Oral debris removed w/ oral care by SLP. Pt was able to follow through w/ tasks w/ Mod+ verbal/tactile cues but often closed eyes during trials.  D/t pt's declined Cognitive status, and his risk for aspiration, recommend NPO status including Medications. ST services recommends follow w/ Palliative Care for [place] as he was being followed by [place] services just prior to admits. ST services will follow pt during this admit for ongoing Medical and Pulmonary status', changes and for any signs of readiness for po's, diet. Largely suspect that pt's Medical and Cognitive decline could continue to hamper safe oral intake and upgrade of diet. SLP Visit Diagnosis: Dysphagia, oropharyngeal phase (R13.12) (Severe)    Aspiration Risk  Severe aspiration risk;Risk for inadequate nutrition/hydration    Diet Recommendation  NPO w/ frequent oral care for hygiene and stimulation of swallowing; general aspiration precautions and support w/ oral care  Medication Administration: Via alternative means    Other  Recommendations Recommended Consults:  (Palliative Care consult for [place]) Oral Care Recommendations: Oral care QID;Staff/trained caregiver to provide oral care Other Recommendations:  (TBD)   Follow up Recommendations Skilled Nursing facility (TBD)      Frequency and Duration min 3x week  2 weeks       Prognosis Prognosis for Safe Diet Advancement: Guarded Barriers to Reach Goals: Cognitive deficits;Time post onset;Severity of deficits;Behavior      Swallow Study   General Date of Onset: 09/10/20 HPI: [name] with medical history significant for paroxysmal atrial fibrillation on anticoagulation therapy with Eliquis, chronic diastolic dysfunction CHF, abdominal aortic aneurysm, stage III chronic  kidney disease and coronary artery disease.  He is currently S/P L hemiarthroplasty and receiving Rehab at facility post d/c from hospital last week.  Unsure of pt's Baseline Cognitive status prior to fall and hip surgery.  Pt was being followed by [facility] w/ Malnutrition, per chart notes.  He presents to the emergency department for shortness of breath from his rehab facility.   According to EMS report patient is currently at [facility] rehab after his left hip replacement.  Patient was noted to be short of breath by staff today and have a room air saturation of 77% and febrile to 101.4.  Upon arrival patient does appear very fatigued and weak however he is able to answer basic questions and is oriented.  States some pain in the left hip but denies any other pain.  Patient  is short of breath.  There was concern he might be developing a UTI at the facility. Chest CT: "Multifocal airspace disease consistent with pneumonia, head  suspicious for aspiration. Dense airspace consolidation in the  dependent right upper, middle and lower lobes. Dense but lesser  consolidation in the dependent left lower lobe. Right bronchus  intermedius is completely collapsed or occluded, motion obscures  detailed assessment. Right lower lobe bronchus appears filled.  Additional patchy tree in bud and ground-glass opacities in the more  anterior right lung.". Type of Study: Bedside Swallow Evaluation Previous Swallow Assessment: 09/05/2020 Diet Prior to this Study: Dysphagia 1 (puree);Nectar-thick liquids Temperature Spikes Noted: Yes (at admit; wbc elevated 15.2) Respiratory Status: Nasal cannula (6-15L) History of Recent Intubation: No Behavior/Cognition: Alert;Cooperative;Pleasant mood;Confused;Distractible;Requires cueing Oral Cavity Assessment: Dry;Dried secretions Oral Care Completed by SLP: Yes Oral Cavity - Dentition: Poor condition;Missing dentition Vision:  (n/a) Self-Feeding  Abilities: Total assist Patient Positioning: Upright in bed (needed full positioning) Baseline Vocal Quality: Low vocal intensity Volitional Cough: Weak;Congested Volitional Swallow: Unable to elicit    Oral/Motor/Sensory Function Overall Oral Motor/Sensory Function: Generalized oral weakness (decreased lingual movements in general) Facial Symmetry: Within Functional Limits Lingual Symmetry: Within Functional Limits Lingual Strength: Reduced Mandible:  (weak)   Ice Chips Ice chips: Impaired Presentation: Spoon (fed; 2 trials) Oral Phase Impairments: Poor awareness of bolus Oral Phase Functional Implications: Prolonged oral transit Pharyngeal Phase Impairments: Multiple swallows (audible)   Thin Liquid Thin Liquid: Impaired Presentation: Cup;Straw (fed; Son then SLP; 3-4 trials each) Oral Phase Impairments: Poor awareness of bolus;Reduced labial seal;Reduced lingual movement/coordination Oral Phase Functional Implications: Prolonged oral transit (poor oral control) Pharyngeal  Phase Impairments: Suspected delayed Swallow;Cough - Immediate;Cough - Delayed;Multiple swallows;Wet Vocal Quality;Decreased hyoid-laryngeal movement    Nectar Thick Nectar Thick Liquid: Impaired Presentation: Spoon (fed; SLP - 4 trials) Oral Phase Impairments: Poor awareness of bolus;Reduced labial seal;Reduced lingual movement/coordination Oral phase functional implications: Prolonged oral transit Pharyngeal Phase Impairments: Suspected delayed Swallow;Multiple swallows;Cough - Immediate;Cough - Delayed;Wet Vocal Quality;Decreased hyoid-laryngeal movement   Honey Thick Honey Thick Liquid: Not tested   Puree Puree: Impaired Presentation: Spoon (fed; 3 trials) Oral Phase Impairments: Reduced labial seal;Reduced lingual movement/coordination;Poor awareness of bolus Oral Phase Functional Implications: Prolonged oral transit (munching behavior during bolus management) Pharyngeal Phase Impairments: Decreased  hyoid-laryngeal movement;Multiple swallows   Solid     Solid: Not tested       Orinda Kenner, MS, CCC-SLP Speech Language Pathologist Rehab Services [phone] [facility] 09/11/2020,2:32 PM

## 2020-09-12 DIAGNOSIS — E785 Hyperlipidemia, unspecified: Secondary | ICD-10-CM

## 2020-09-12 DIAGNOSIS — I10 Essential (primary) hypertension: Secondary | ICD-10-CM

## 2020-09-12 DIAGNOSIS — Z951 Presence of aortocoronary bypass graft: Secondary | ICD-10-CM

## 2020-09-12 DIAGNOSIS — C61 Malignant neoplasm of prostate: Secondary | ICD-10-CM

## 2020-09-12 DIAGNOSIS — R5381 Other malaise: Secondary | ICD-10-CM

## 2020-09-12 DIAGNOSIS — R531 Weakness: Secondary | ICD-10-CM

## 2020-09-12 DIAGNOSIS — J189 Pneumonia, unspecified organism: Secondary | ICD-10-CM

## 2020-09-12 DIAGNOSIS — R6520 Severe sepsis without septic shock: Secondary | ICD-10-CM | POA: Diagnosis not present

## 2020-09-12 DIAGNOSIS — G8929 Other chronic pain: Secondary | ICD-10-CM

## 2020-09-12 DIAGNOSIS — D508 Other iron deficiency anemias: Secondary | ICD-10-CM

## 2020-09-12 DIAGNOSIS — Z87440 Personal history of urinary (tract) infections: Secondary | ICD-10-CM

## 2020-09-12 DIAGNOSIS — I480 Paroxysmal atrial fibrillation: Secondary | ICD-10-CM

## 2020-09-12 DIAGNOSIS — M5441 Lumbago with sciatica, right side: Secondary | ICD-10-CM

## 2020-09-12 DIAGNOSIS — R1310 Dysphagia, unspecified: Secondary | ICD-10-CM | POA: Diagnosis not present

## 2020-09-12 DIAGNOSIS — A419 Sepsis, unspecified organism: Secondary | ICD-10-CM | POA: Diagnosis not present

## 2020-09-12 DIAGNOSIS — N1831 Chronic kidney disease, stage 3a: Secondary | ICD-10-CM

## 2020-09-12 DIAGNOSIS — J9601 Acute respiratory failure with hypoxia: Secondary | ICD-10-CM | POA: Diagnosis not present

## 2020-09-12 MED ORDER — POTASSIUM CHLORIDE 10 MEQ/100ML IV SOLN
10.0000 meq | INTRAVENOUS | Status: AC
  Administered 2020-09-12 (×4): 10 meq via INTRAVENOUS
  Filled 2020-09-12: qty 100

## 2020-09-12 MED ORDER — DEXTROSE 5 % IV SOLN: INTRAVENOUS | Status: DC

## 2020-09-12 NOTE — Consult Note (Addendum)
Consultation Note Date: 09/12/2020   Patient Name: Carl [name]  DOB: 1936/04/04  MRN: [id number]  Age / Sex: 85 y.o., male  PCP: [name], MD Referring Physician: Allie Bossier, MD  Reason for Consultation: Establishing goals of care  HPI/Patient Profile: 85 y.o. male  with past medical history ofrecent admission for left hip fracture status post left hip hemiarthroplasty, AAA, A. fib, CKD 2, CAD status post CABG x5 in 2006, hyperlipidemia, hypertension, GERD, iron deficiency anemia, orthostatic hypotension, prostate cancer, and HFpEF admitted on 09/10/2020 with hypoxia and fever. Diagnosed with severe sepsis and acute respiratory failure d/t aspiration pneumonia. Patient seen by SLP who recommended NPO as patient at high risk for aspiration. PMT consulted to discuss goals of care.    Clinical Assessment and Goals of Care: I have reviewed medical records including EPIC notes, labs and imaging, received report from [place], [place], assessed the patient and then met with patient and his son, Carl [name], to discuss diagnosis prognosis, [place], EOL wishes, disposition and options.  Patient tells me he wants me to speak to his son regarding his goals of care.   I introduced Palliative Medicine as specialized medical care for people living with serious illness. It focuses on providing relief from the symptoms and stress of a serious illness. The goal is to improve quality of life for both the patient and the family.  Son begins conversation my telling me patient's desire to return to rehab with the ultimate goal of returning home. We discussed the challenges of meeting this goal.    We discussed patient's current illness and what it means in the larger context of patient's on-going co-morbidities.  Natural disease trajectory was discussed. We discuss that though patient has improved, this is because of treatment with antibiotics and that he  continues to struggle with dysphagia. We discuss difficulties of maintaining nutritional status and how poor nutrition and weakness contribute patient's dysphagia. We discuss inability of patient to participate in rehab with ongoing malnutrition and weakness. We discuss high risk of aspiration and returning to hospital. We discuss option of PEG for nutritional support. We discuss if this would be in line with patient's wishes and if this would promote what he considers good quality of life.   I attempted to elicit values and goals of care important to the patient.  We discuss focusing on quality of life vs focusing on prolonging life.   The difference between aggressive medical intervention and comfort care was considered in light of the patient's goals of care.   Discussed with family the importance of continued conversation with family and the medical providers regarding overall plan of care and treatment options, ensuring decisions are within the context of the patient's values and GOCs.    Son tells me he plans to speak to his siblings and the patient about this further and is unsure how family wants to proceed at this point. Patient's daughter, Carl [name], will be at bedside tomorrow and we will continue [place] discussions with her.   Questions and concerns were addressed. The family was encouraged to call with questions or concerns.   Primary Decision Maker NEXT OF KIN - children   SUMMARY OF RECOMMENDATIONS   - ongoing goals of care discussions - family considering continued aggressive path with rehab/peg vs comfort approach - plan to follow up with family 2/17 - potentially eligible for hospice facility IF family elects comfort focused path  Code Status/Advance Care Planning:  DNR  Discharge Planning: To Be  Determined      Primary Diagnoses: Present on Admission: . Severe sepsis ([place]) . AF (paroxysmal atrial fibrillation) ([place]) . Chronic kidney disease (CKD), stage III (moderate)  (HCC) . Closed left hip fracture ([place]) . Essential hypertension . Dyslipidemia . GERD (gastroesophageal reflux disease) . History of UTI . Low back pain . Iron deficiency anemia . Longstanding persistent atrial fibrillation ([place]) . Physical deconditioning . Prostate CA [facility])   I have reviewed the medical record, interviewed the patient and family, and examined the patient. The following aspects are pertinent.  Past Medical History:  Diagnosis Date  . (HFpEF) heart failure with preserved ejection fraction ([place])    a. 08/2016 Echo: EF 50-55%.  [name] AAA (abdominal aortic aneurysm) ([place]) 06/05/2019   abdominal aortic aneurysm measuring 3.1 cm.  . Arthritis    arms  . Bladder calculus   . Chronic anemia   . Chronic anxiety   . CKD (chronic kidney disease), stage III ([place])   . Coronary artery disease CARDIOLOGIST-  DR Rockey Situ   a. CABG 2006 with  LIMA-LAD, SVG-Ramus, SVG-OM, seq SVG-PLV-PDA.  . DOE (dyspnea on exertion)   . Frequent falls 11/2019  . GERD (gastroesophageal reflux disease)   . Heart murmur   . Hematuria   . Hip fracture ([place]) 10/05/2018  . History of bladder stone   . History of colon polyps   . History of non-ST elevation myocardial infarction (NSTEMI) 03/30/2005   post op recovery inguinal hernia repair  . History of pressure ulcer   . History of recurrent UTIs    last admission 08-04-2018 pseudomonas UTI  . Hyperlipidemia   . Hyperplasia of prostate without lower urinary tract symptoms (LUTS)   . Hypertension   . Indwelling Foley catheter present   . Intracranial hematoma ([place]) 10/05/2018  . Ischemic cardiomyopathy    a. echo (01/13) 45-50% septal akinesis, mild MR; b. Echo  (01/16) ef 60-65%, mid-apical anteroseptal hypokinesis; c. echo 09-16-2016 ef 50-55%  . Left ureteral stone   . Lung nodule    noted 08/ 2018;  followed by pcp  . LVH (left ventricular hypertrophy) 09/16/2016   Moderate noted on ECHO  . Moderate mitral regurgitation    a.08/2016 Echo:  Mod MR/TR.  [name] Orthostatic hypotension   . PAC (premature atrial contraction)   . Paroxysmal atrial flutter ([place])    a. dx 08/2016, started on amiodarone.  . Persistent atrial fibrillation [facility]) followed by cardiologist-- dr Rockey Situ   a. dx AFib 08/2016 s/p DCCV 10/2016-->eliquis (CHA2DS2VASc = 5).  . Pneumonia   . Pulmonary hypertension ([place])    last echo in epic 09-16-2016  . Recurrent prostate cancer ([place]) UROLOGIST-- DR BELL   s/p  radioactive prostate seed implants in 1997;    due to PSA rising pt has been doing lupron injection's few past several yrs at dr bell office  . Renal calculi    bilateral per CT 06-18-2018  . S/P CABG x 5 04/04/2005   LIMA to LAD,  SVG to Ramus,  SVG to OM,  seqSVG to PLV and PDA  . Wears glasses    Social History   Socioeconomic History  . Marital status: Widowed    Spouse name: Not on file  . Number of children: 3  . Years of education: Not on file  . Highest education level: Not on file  Occupational History  . Occupation: retired  Tobacco Use  . Smoking status: Former Smoker    Years: 2.00    Types:  Cigarettes    Quit date: 07/28/1960    Years since quitting: 60.1  . Smokeless tobacco: Never Used  Vaping Use  . Vaping Use: Never used  Substance and Sexual Activity  . Alcohol use: No  . Drug use: No  . Sexual activity: Not Currently  Other Topics Concern  . Not on file  Social History Narrative   Widowed lives with daughter Carl [name]   Social Determinants of Health   Financial Resource Strain: High Risk  . Difficulty of Paying Living Expenses: Hard  Food Insecurity: No Food Insecurity  . Worried About Charity fundraiser in the Last Year: Never true  . Ran Out of Food in the Last Year: Never true  Transportation Needs: No Transportation Needs  . Lack of Transportation (Medical): No  . Lack of Transportation (Non-Medical): No  Physical Activity: Not on file  Stress: No Stress Concern Present  . Feeling of Stress : Not at all   Social Connections: Unknown  . Frequency of Communication with Friends and Family: Not on file  . Frequency of Social Gatherings with Friends and Family: More than three times a week  . Attends Religious Services: Not on file  . Active Member of Clubs or Organizations: Not on file  . Attends Archivist Meetings: Not on file  . Marital Status: Not on file   Family History  Problem Relation Age of Onset  . Heart disease Mother   . Cancer Mother   . Cancer Father   . Cancer Brother    Scheduled Meds: . enoxaparin (LOVENOX) injection  1 mg/kg Subcutaneous Q12H  . sodium chloride flush  3 mL Intravenous Q12H   Continuous Infusions: . ceFEPime (MAXIPIME) IV 2 g (09/12/20 0948)  . dextrose    . metronidazole 500 mg (09/12/20 1127)  . potassium chloride 10 mEq (09/12/20 1405)   PRN Meds:.acetaminophen **OR** acetaminophen, morphine injection, traMADol Allergies  Allergen Reactions  . Ciprofloxacin Other (See Comments)    PER HEART DR  . Ibuprofen Other (See Comments)    Kidney issues   Review of Systems  Unable to perform ROS: Mental status change    Physical Exam Constitutional:      General: He is not in acute distress.    Comments: lethargic  Pulmonary:     Effort: Pulmonary effort is normal. No respiratory distress.  Skin:    General: Skin is warm and dry.  Neurological:     Comments: Answers questions appropriately though falls asleep quickly and does not engage in conversation further, unclear if he is completely oriented to situation    Vital Signs: BP (!) 113/59 (BP Location: Right Arm)   Pulse 77   Temp 98.6 F (37 C)   Resp (!) 22   Ht '6\' 3"'  (1.905 m)   Wt 85 kg   SpO2 100%   BMI 23.42 kg/m  Pain Scale: 0-10   Pain Score: 7    SpO2: SpO2: 100 % O2 Device:SpO2: 100 % O2 Flow Rate: .O2 Flow Rate (L/min): 4 L/min  IO: Intake/output summary:   Intake/Output Summary (Last 24 hours) at 09/12/2020 1407 Last data filed at 09/12/2020  8592 Gross per 24 hour  Intake 200 ml  Output 2 ml  Net 198 ml    LBM: Last BM Date: 09/12/20 Baseline Weight: Weight: 85 kg Most recent weight: Weight: 85 kg     Palliative Assessment/Data: PPS 20% d/t PO intake    Time Total: 60 minutes Greater than 50%  of this time was spent counseling and coordinating care related to the above assessment and plan.  Juel Burrow, DNP, AGNP-C Palliative Medicine Team [phone] Pager: [phone]

## 2020-09-12 NOTE — Progress Notes (Signed)
PROGRESS NOTE    BRAIDAN [name]  BMW:[id number] DOB: 07/24/1936 DOA: 09/10/2020 PCP: [name], MD     Brief Narrative:  Patient was discharged on February 11 to [facility] after hip fracture surgery. He had speech evaluation done and was recommending dysphagia diet came in with respiratory distress, hypoxia and found to have aspiration pneumonia.   Patient's son in the room. Was feeding ice chips. Evaluated by speech therapist earlier. NPO currently. Patient has lot of gurgling sound unable to protect his airway. Constantly keeps coughing even during talking.   Subjective: Afebrile overnight A/O x0 (per son just received morphine for pain).   Assessment & Plan: Covid vaccination; vaccinated   Principal Problem:   Severe sepsis ([place]) Active Problems:   Hx of CABG x 5 '06   Dyslipidemia   Essential hypertension   Chronic kidney disease (CKD), stage III (moderate) (HCC)   Prostate CA (HCC)   Longstanding persistent atrial fibrillation (HCC)   Generalized weakness   Physical deconditioning   Low back pain   GERD (gastroesophageal reflux disease)   Iron deficiency anemia   History of UTI   Closed left hip fracture (HCC)   AF (paroxysmal atrial fibrillation) (HCC)   Acute respiratory failure with hypoxia (HCC)   Severe sepsis/Acute hypoxic respiratory failure with hypoxia -On admission met criteria for severe sepsis leukocytosis to 15K , T-max 101.4, respiratory rate into the 20s, lactic acid elevated to 3.3. -CT scan clearly shows that patient has had chronic aspiration, with loss of most of the RLL, RML completely occluded, RUL involved.  In addition LLL with opacification -2/16 per son not on O2 prior to hospitalization. -2/16 currently requiring O2 to maintain appropriate SPO2 see below -Complete 7-day course antibiotics -Discussed at length with son that this is going to be an ongoing process, will not fully resolve and most likely will be the death  of his father at some point in the future. -Lactic acidosis resolved  Bilateral aspiration pneumonia -See severe sepsis  Dysphagia --patient was seen by speech therapist. Recommends patient to be NPO. Very high risk for aspiration.  Physical deconditioning -Chronic -Recent admission for left hip fracture status post left knee arthroplasty -Continue PT as tolerated  CKD stage II (baseline Cr 1.2) -Continue hydration  Hypovolemic hypernatremia Lab Results  Component Value Date   NA 148 (H) 09/11/2020   NA 149 (H) 09/10/2020   NA 134 (L) 09/05/2020   NA 134 (L) 09/04/2020   NA 138 09/03/2020  -2/16 D5W 144m/hr -Avoid nephrotoxic agents  Transaminitis --AST 63, ALT 69, this is improved from previous. T bili of 2.5 is new. Suspect this to be in the setting of severe sepsis. -Continue to monitor for now  A. Fib -home amiodarone and Eliquis(hold) ---> Lovenox -2/16 NSR  Essential HTN   CAD s/p CABGx  5 in  2006  Hyperlipidemia  GERD -on  PPI  Iron deficiency anemia -Hemoglobin is currently stable at 7.5 -Continue iron supplementation  Chronic low back pain -prn IV morphine since pt is NPO  Prostate cancer -on  home darolutamide (holding --NPO)  Hypokalemia -Potassium IV 50 mEq  Hypocalcemia -2/16 corrected calcium 9.16     Goals of care -Had a long discussion with patient's son in the room. He understands patient has a poor prognosis. Patient is DNR. Son wants to discuss with other family members.  -2/16 palliative care discussed outcome with patient son, son wants to discuss with his family members.   DVT  prophylaxis: Lovenox Code Status: DNR Family Communication: 2/16 son at bedside for discussion of plan of care answered all questions Status is: Inpatient    Dispo: The patient is from: Home              Anticipated d/c is to: SNF vs hospice              Anticipated d/c date is:??              Patient currently extremely  unstable      Consultants:  Palliative care   Procedures/Significant Events:    I have personally reviewed and interpreted all radiology studies and my findings are as above.  VENTILATOR SETTINGS: HFNC 2/16 Flow 6 L/min SPO2 96%   Cultures   Antimicrobials: Anti-infectives (From admission, onward)   Start     Dose/Rate Route Frequency Ordered Stop   09/11/20 2200  vancomycin (VANCOREADY) IVPB 1250 mg/250 mL  Status:  Discontinued        1,250 mg 166.7 mL/hr over 90 Minutes Intravenous Every 24 hours 09/10/20 2214 09/11/20 1041   09/11/20 1000  ceFEPIme (MAXIPIME) 2 g in sodium chloride 0.9 % 100 mL IVPB        2 g 200 mL/hr over 30 Minutes Intravenous Every 12 hours 09/10/20 2205     09/11/20 0200  metroNIDAZOLE (FLAGYL) IVPB 500 mg        500 mg 100 mL/hr over 60 Minutes Intravenous Every 8 hours 09/10/20 2014     09/10/20 2145  vancomycin (VANCOCIN) IVPB 1000 mg/200 mL premix        1,000 mg 200 mL/hr over 60 Minutes Intravenous  Once 09/10/20 2144 09/10/20 2314   09/10/20 1745  ceFEPIme (MAXIPIME) 2 g in sodium chloride 0.9 % 100 mL IVPB        2 g 200 mL/hr over 30 Minutes Intravenous  Once 09/10/20 1737 09/10/20 1857   09/10/20 1745  metroNIDAZOLE (FLAGYL) IVPB 500 mg        500 mg 100 mL/hr over 60 Minutes Intravenous  Once 09/10/20 1737 09/10/20 1800   09/10/20 1745  vancomycin (VANCOCIN) IVPB 1000 mg/200 mL premix        1,000 mg 200 mL/hr over 60 Minutes Intravenous  Once 09/10/20 1737 09/10/20 1839       Devices    LINES / TUBES:      Continuous Infusions: . sodium chloride 100 mL/hr at 09/12/20 0828  . ceFEPime (MAXIPIME) IV Stopped (09/11/20 2256)  . metronidazole 500 mg (09/12/20 0256)     Objective: Vitals:   09/11/20 1930 09/12/20 0007 09/12/20 0401 09/12/20 0839  BP: (!) 112/54 112/68 116/63 115/67  Pulse: 79 82 79 81  Resp: (!) '24 20 20 ' (!) 21  Temp: 97.6 F (36.4 C) 98.2 F (36.8 C) 97.8 F (36.6 C) (!) 97.1 F (36.2 C)   TempSrc:      SpO2: 92% 100% 99% 96%  Weight:      Height:        Intake/Output Summary (Last 24 hours) at 09/12/2020 7416 Last data filed at 09/12/2020 3845 Gross per 24 hour  Intake 200 ml  Output 2 ml  Net 198 ml   Filed Weights   09/10/20 1736  Weight: 85 kg    Examination:  General: A/O x0, positive acute respiratory distress, cachectic Eyes: negative scleral hemorrhage, negative anisocoria, negative icterus ENT: Negative Runny nose, negative gingival bleeding, Neck:  Negative scars, masses, torticollis, lymphadenopathy, JVD Lungs: decreased  breath sounds auscultation bilaterally without wheezes or crackles, positive rhonchi left lower lobe Cardiovascular: Regular rate and rhythm without murmur gallop or rub normal S1 and S2 Abdomen: negative abdominal pain, nondistended, positive soft, bowel sounds, no rebound, no ascites, no appreciable mass Extremities: No significant cyanosis, clubbing, or edema bilateral lower extremities Skin: Negative rashes, lesions, ulcers Psychiatric: Unable to evaluate patient just received morphine Central nervous system: Unable to evaluate patient just received morphine .     Data Reviewed: Care during the described time interval was provided by me .  I have reviewed this patient's available data, including medical history, events of note, physical examination, and all test results as part of my evaluation.  CBC: Recent Labs  Lab 09/07/20 0630 09/10/20 1740 09/11/20 0602  WBC 6.7 15.2* 15.2*  NEUTROABS  --  14.0*  --   HGB 7.7* 7.5* 7.6*  HCT 23.7* 23.4* 24.0*  MCV 100.4* 100.9* 101.3*  PLT 136* 199 086   Basic Metabolic Panel: Recent Labs  Lab 09/06/20 0631 09/07/20 0630 09/10/20 1737 09/11/20 0602  NA  --   --  149* 148*  K  --   --  5.0 3.3*  CL  --   --  118* 117*  CO2  --   --  21* 22  GLUCOSE  --   --  124* 109*  BUN  --   --  48* 50*  CREATININE  --   --  1.47* 1.42*  CALCIUM  --   --  8.3* 7.8*  MG 2.0 2.3  --    --    GFR: Estimated Creatinine Clearance: 47.9 mL/min (A) (by C-G formula based on SCr of 1.42 mg/dL (H)). Liver Function Tests: Recent Labs  Lab 09/10/20 1737 09/11/20 0602  AST 63* 40  ALT 69* 55*  ALKPHOS 79 73  BILITOT 2.5* 1.7*  PROT 5.7* 5.1*  ALBUMIN 2.5* 2.3*   No results for input(s): LIPASE, AMYLASE in the last 168 hours. No results for input(s): AMMONIA in the last 168 hours. Coagulation Profile: Recent Labs  Lab 09/10/20 1740 09/11/20 0602  INR 2.5* 2.4*   Cardiac Enzymes: No results for input(s): CKTOTAL, CKMB, CKMBINDEX, TROPONINI in the last 168 hours. BNP (last 3 results) No results for input(s): PROBNP in the last 8760 hours. HbA1C: No results for input(s): HGBA1C in the last 72 hours. CBG: No results for input(s): GLUCAP in the last 168 hours. Lipid Profile: No results for input(s): CHOL, HDL, LDLCALC, TRIG, CHOLHDL, LDLDIRECT in the last 72 hours. Thyroid Function Tests: No results for input(s): TSH, T4TOTAL, FREET4, T3FREE, THYROIDAB in the last 72 hours. Anemia Panel: Recent Labs    09/10/20 2013 09/11/20 0602  VITAMINB12  --  425  FOLATE 16.7  --    Sepsis Labs: Recent Labs  Lab 09/10/20 1737 09/10/20 2210 09/11/20 0602  PROCALCITON  --   --  0.87  LATICACIDVEN 3.3* 1.3  --     Recent Results (from the past 240 hour(s))  SARS Coronavirus 2 by RT PCR (hospital order, performed in [facility] hospital lab) Nasopharyngeal Nasopharyngeal Swab     Status: None   Collection Time: 09/02/20  9:21 AM   Specimen: Nasopharyngeal Swab  Result Value Ref Range Status   SARS Coronavirus 2 NEGATIVE NEGATIVE Final    Comment: (NOTE) SARS-CoV-2 target nucleic acids are NOT DETECTED.  The SARS-CoV-2 RNA is generally detectable in upper and lower respiratory specimens during the acute phase of infection. The lowest concentration of  SARS-CoV-2 viral copies this assay can detect is 250 copies / mL. A negative result does not preclude SARS-CoV-2  infection and should not be used as the sole basis for treatment or other patient management decisions.  A negative result may occur with improper specimen collection / handling, submission of specimen other than nasopharyngeal swab, presence of viral mutation(s) within the areas targeted by this assay, and inadequate number of viral copies (<250 copies / mL). A negative result must be combined with clinical observations, patient history, and epidemiological information.  Fact Sheet for Patients:   StrictlyIdeas.no  Fact Sheet for Healthcare Providers: BankingDealers.co.za  This test is not yet approved or  cleared by the [place] FDA and has been authorized for detection and/or diagnosis of SARS-CoV-2 by FDA under an Emergency Use Authorization (EUA).  This EUA will remain in effect (meaning this test can be used) for the duration of the COVID-19 declaration under Section 564(b)(1) of the Act, 21 U.S.C. section 360bbb-3(b)(1), unless the authorization is terminated or revoked sooner.  Performed at [facility], [place], [place] [address]   SARS CORONAVIRUS 2 (TAT 6-24 HRS) Nasopharyngeal Nasopharyngeal Swab     Status: None   Collection Time: 09/06/20  3:45 PM   Specimen: Nasopharyngeal Swab  Result Value Ref Range Status   SARS Coronavirus 2 NEGATIVE NEGATIVE Final    Comment: (NOTE) SARS-CoV-2 target nucleic acids are NOT DETECTED.  The SARS-CoV-2 RNA is generally detectable in upper and lower respiratory specimens during the acute phase of infection. Negative results do not preclude SARS-CoV-2 infection, do not rule out co-infections with other pathogens, and should not be used as the sole basis for treatment or other patient management decisions. Negative results must be combined with clinical observations, patient history, and epidemiological information. The expected result is  Negative.  Fact Sheet for Patients: SugarRoll.be  Fact Sheet for Healthcare Providers: https://www.-mathews.com/  This test is not yet approved or cleared by the [place] FDA and  has been authorized for detection and/or diagnosis of SARS-CoV-2 by FDA under an Emergency Use Authorization (EUA). This EUA will remain  in effect (meaning this test can be used) for the duration of the COVID-19 declaration under Se ction 564(b)(1) of the Act, 21 U.S.C. section 360bbb-3(b)(1), unless the authorization is terminated or revoked sooner.  Performed at [facility], [place] [address] [place], [place] [address]   Blood Culture (routine x 2)     Status: None (Preliminary result)   Collection Time: 09/10/20  5:37 PM   Specimen: BLOOD  Result Value Ref Range Status   Specimen Description BLOOD LEFT ANTECUBITAL  Final   Special Requests   Final    BOTTLES DRAWN AEROBIC AND ANAEROBIC Blood Culture adequate volume   Culture   Final    NO GROWTH 2 DAYS Performed at [facility], [address] [place], [place] [address]    Report Status PENDING  Incomplete  Resp Panel by RT-PCR (Flu A&B, Covid) Nasopharyngeal Swab     Status: None   Collection Time: 09/10/20  5:39 PM   Specimen: Nasopharyngeal Swab; Nasopharyngeal(NP) swabs in vial transport medium  Result Value Ref Range Status   SARS Coronavirus 2 by RT PCR NEGATIVE NEGATIVE Final    Comment: (NOTE) SARS-CoV-2 target nucleic acids are NOT DETECTED.  The SARS-CoV-2 RNA is generally detectable in upper respiratory specimens during the acute phase of infection. The lowest concentration of SARS-CoV-2 viral copies this assay can detect  is 138 copies/mL. A negative result does not preclude SARS-Cov-2 infection and should not be used as the sole basis for treatment or other patient management decisions. A negative result may occur with  improper specimen collection/handling,  submission of specimen other than nasopharyngeal swab, presence of viral mutation(s) within the areas targeted by this assay, and inadequate number of viral copies(<138 copies/mL). A negative result must be combined with clinical observations, patient history, and epidemiological information. The expected result is Negative.  Fact Sheet for Patients:  EntrepreneurPulse.com.au  Fact Sheet for Healthcare Providers:  IncredibleEmployment.be  This test is no t yet approved or cleared by the [place] FDA and  has been authorized for detection and/or diagnosis of SARS-CoV-2 by FDA under an Emergency Use Authorization (EUA). This EUA will remain  in effect (meaning this test can be used) for the duration of the COVID-19 declaration under Section 564(b)(1) of the Act, 21 U.S.C.section 360bbb-3(b)(1), unless the authorization is terminated  or revoked sooner.       Influenza A by PCR NEGATIVE NEGATIVE Final   Influenza B by PCR NEGATIVE NEGATIVE Final    Comment: (NOTE) The Xpert Xpress SARS-CoV-2/FLU/RSV plus assay is intended as an aid in the diagnosis of influenza from Nasopharyngeal swab specimens and should not be used as a sole basis for treatment. Nasal washings and aspirates are unacceptable for Xpert Xpress SARS-CoV-2/FLU/RSV testing.  Fact Sheet for Patients: EntrepreneurPulse.com.au  Fact Sheet for Healthcare Providers: IncredibleEmployment.be  This test is not yet approved or cleared by the [place] FDA and has been authorized for detection and/or diagnosis of SARS-CoV-2 by FDA under an Emergency Use Authorization (EUA). This EUA will remain in effect (meaning this test can be used) for the duration of the COVID-19 declaration under Section 564(b)(1) of the Act, 21 U.S.C. section 360bbb-3(b)(1), unless the authorization is terminated or revoked.  Performed at [facility], [address] [place], [place] [address]   Blood Culture (routine x 2)     Status: None (Preliminary result)   Collection Time: 09/10/20  5:42 PM   Specimen: BLOOD  Result Value Ref Range Status   Specimen Description BLOOD RIGHT ANTECUBITAL  Final   Special Requests   Final    BOTTLES DRAWN AEROBIC AND ANAEROBIC Blood Culture adequate volume   Culture   Final    NO GROWTH 2 DAYS Performed at [facility], [address], [place], [place] [address]    Report Status PENDING  Incomplete  Urine culture     Status: Abnormal (Preliminary result)   Collection Time: 09/10/20 11:34 PM   Specimen: Urine, Random  Result Value Ref Range Status   Specimen Description   Final    URINE, RANDOM Performed at [facility], [address], [place], [place] [address]    Special Requests   Final    NONE Performed at [facility], [address], [place], [place] [address]    Culture >=100,000 COLONIES/mL GRAM NEGATIVE RODS (A)  Final   Report Status PENDING  Incomplete         Radiology Studies: CT Angio Chest PE W and/or Wo Contrast  Result Date: 09/10/2020 CLINICAL DATA:  Chest pain or SOB, pleurisy or effusion suspected SOB, recent hip surgery eval for pE EXAM: CT ANGIOGRAPHY CHEST WITH CONTRAST TECHNIQUE: Multidetector CT imaging of the chest was performed using the standard protocol during bolus administration of intravenous contrast. Multiplanar CT image reconstructions and MIPs were obtained to evaluate the vascular  anatomy. CONTRAST:  40m OMNIPAQUE IOHEXOL 350 MG/ML SOLN COMPARISON:  Chest radiograph earlier today.  Chest CT 06/18/2018 FINDINGS: Cardiovascular: There are no filling defects within the pulmonary arteries to suggest pulmonary embolus. Subsegmental branches are not well assessed due to contrast bolus timing and motion. There is dilatation of the main pulmonary artery at 3.5 cm. Cardiomegaly post CABG. Calcification of native coronary arteries.  Aortic atherosclerosis. No aneurysm or dissection. Left vertebral artery arises directly from the thoracic aorta, variant anatomy. Abnormal left upper lobe venous drainage into the brachiocephalic confluence. Mediastinum/Nodes: Scattered intraluminal fluid in the esophagus, small hiatal hernia. Assessment for right hilar adenopathy is limited due to adjacent airspace disease. There are multiple small mediastinal lymph nodes are not enlarged by size criteria. No thyroid nodule. Lungs/Pleura: Dense airspace consolidation in the dependent right upper lobe, dependent right middle lobe, dependent right lower lobe. Dense but lesser consolidation in the dependent left lower lobe. Right bronchus intermedius is completely collapsed or occluded, motion obscures detailed assessment. Right lower lobe bronchus appears filled. There additional patchy tree in bud and ground-glass opacities in the more anterior right lung. No significant pleural fluid. No findings of pulmonary edema. Upper Abdomen: Left renal atrophy with large stones and upper pole cyst, partially included but unchanged from prior abdominal CT. Calcified splenic granuloma. No acute findings in the upper abdomen. Musculoskeletal: Bones diffusely under mineralized. Median sternotomy. Remote L1 compression fracture unchanged from prior. Bridging osteophytes throughout the thoracic spine. No acute osseous abnormalities. Review of the MIP images confirms the above findings. IMPRESSION: 1. No pulmonary embolus. 2. Multifocal airspace disease consistent with pneumonia, head suspicious for aspiration. Dense airspace consolidation in the dependent right upper, middle and lower lobes. Dense but lesser consolidation in the dependent left lower lobe. Right bronchus intermedius is completely collapsed or occluded, motion obscures detailed assessment. Right lower lobe bronchus appears filled. Additional patchy tree in bud and ground-glass opacities in the more anterior right  lung. 3. Dilatation of the main pulmonary artery suggesting pulmonary arterial hypertension. 4. Aortic atherosclerosis without acute aortic finding. Mild cardiomegaly post CABG. Aortic Atherosclerosis (ICD10-I70.0). Electronically Signed   By: MKeith RakeM.D.   On: 09/10/2020 20:32   DG Chest Port 1 View  Result Date: 09/10/2020 CLINICAL DATA:  Sepsis EXAM: PORTABLE CHEST 1 VIEW COMPARISON:  09/02/2020 FINDINGS: Single frontal view of the chest demonstrates stable postsurgical changes from CABG. The cardiac silhouette is stable. There is diffuse bilateral airspace disease, right greater than left. No effusion or pneumothorax. Age-indeterminate right posterolateral sixth and seventh rib fractures. No other acute bony abnormalities. IMPRESSION: 1. Interval development of diffuse bilateral airspace disease, right greater than left, compatible with infection or asymmetric edema. 2. Age-indeterminate right posterolateral sixth and seventh rib fractures. Electronically Signed   By: MRanda NgoM.D.   On: 09/10/2020 18:02        Scheduled Meds: . enoxaparin (LOVENOX) injection  1 mg/kg Subcutaneous Q12H  . sodium chloride flush  3 mL Intravenous Q12H   Continuous Infusions: . sodium chloride 100 mL/hr at 09/12/20 0828  . ceFEPime (MAXIPIME) IV Stopped (09/11/20 2256)  . metronidazole 500 mg (09/12/20 0256)     LOS: 2 days    Time spent:40 min    Yareli Carthen, CGeraldo Docker MD [facility]   If 7PM-7AM, please contact night-coverage 09/12/2020, 9:14 AM

## 2020-09-12 NOTE — Progress Notes (Signed)
Subjective:    [name] is 9 days post left hip hemiarthroplasty.  He was at rehab but developed aspiration pneumonia and was readmitted to the hospital.  He is getting IV antibiotics.  His son says that he is quite a bit better today than he was a couple days ago.  There was some watery drainage from the wound which they wish me to check.  The patient is not complaining of any sniffing and pain.  Patient reports pain as mild.  Objective:   VITALS:   Vitals:   09/12/20 0839 09/12/20 1133  BP: 115/67 (!) 113/59  Pulse: 81 77  Resp: (!) 21 (!) 22  Temp: (!) 97.1 F (36.2 C) 98.6 F (37 C)  SpO2: 96% 100%   The patient is wound is completely benign.  There is quite a bit of ecchymosis.  He has some there is serous drainage.  Range of motion of the hip is good without pain and is stable.  There is no redness or sign of infection at all. Neurologically intact Sensation intact distally Incision: moderate drainage  LABS Recent Labs    09/10/20 1740 09/11/20 0602  HGB 7.5* 7.6*  HCT 23.4* 24.0*  WBC 15.2* 15.2*  PLT 199 215    Recent Labs    09/10/20 1737 09/11/20 0602  NA 149* 148*  K 5.0 3.3*  BUN 48* 50*  CREATININE 1.47* 1.42*  GLUCOSE 124* 109*    Recent Labs    09/10/20 1740 09/11/20 0602  INR 2.5* 2.4*     Assessment/Plan:      Needs frequent dressing changes and a K pad.  He is on IV antibiotics which should cover them. Up with therapy Discharge to SNF   Follow-up my office as planned.

## 2020-09-12 NOTE — Progress Notes (Signed)
Physical Therapy Treatment Patient Details Name: Carl [name] MRN: [id number] DOB: 07-10-1936 Today's Date: 09/12/2020    History of Present Illness Carl [name] is a 85 y.o. male with medical history significant for paroxysmal atrial fibrillation on anticoagulation therapy with Eliquis, chronic diastolic dysfunction CHF, abdominal aortic aneurysm, stage III chronic kidney disease and coronary artery disease who presents to the ER via EMS for evaluation following a fall at home.  Patient states that he tripped and fell landing on his left side.  He denies having any dizziness or lightheadedness prior to the fall and denies any loss of consciousness.  He hit his head and upon presentation to the ER complained of neck pain and pain in his left hip.  He rates his left hip pain a 10 x 10 in intensity at its worst, nonradiating and worse with any form of movement.  Patient was unable to bear weight on the left leg due to pain. S/P L hemiarthroplasty.    PT Comments    Pt resting in bed, lethargic, with wet cough.  Pt receiving ice chips upon arrival, speech improved.  Pt seen for AA/PROM to B LE with focus placed on L LE.  4/10 soreness noted to L hip with PROM, yet able to tolerate range.  Pt repositioned to comfort, heels floating on bed.  Mobility deferred due to extreme lethargy, HgB 7.6,  awaiting possible hospice transition.  May need to reassess current treatment frequency.   Follow Up Recommendations  SNF     Equipment Recommendations       Recommendations for Other Services       Precautions / Restrictions Precautions Precautions: Posterior Hip;Fall Precaution Booklet Issued: No Precaution Comments: hip abduction pillow Restrictions Weight Bearing Restrictions: Yes LLE Weight Bearing: Partial weight bearing    Mobility  Bed Mobility                    Transfers                    Ambulation/Gait                 Stairs              Wheelchair Mobility    Modified Rankin (Stroke Patients Only)       Balance                                            Cognition Arousal/Alertness: Lethargic Behavior During Therapy: Flat affect Overall Cognitive Status: No family/caregiver present to determine baseline cognitive functioning Area of Impairment: Orientation;Attention;Memory;Following commands;Awareness;Safety/judgement;Problem solving                 Orientation Level: Disoriented to;Place;Time Current Attention Level: Alternating Memory: Decreased recall of precautions;Decreased short-term memory Following Commands: Follows one step commands inconsistently     Problem Solving: Slow processing;Decreased initiation;Difficulty sequencing;Requires verbal cues;Requires tactile cues General Comments: Pt very lethargic, held mobility due to HgB 7.6 as well as poor tolerance for activity at this time, Possible transition to Hospice.      Exercises Total Joint Exercises Ankle Circles/Pumps: AAROM;Both;20 reps Short Arc Quad: AAROM;PROM;Both;10 reps Heel Slides: PROM;Both;AAROM;10 reps    General Comments        Pertinent Vitals/Pain Pain Assessment: Faces Faces Pain Scale: Hurts little more Pain Location: L hip with ROM Pain Descriptors /  Indicators: Grimacing Pain Intervention(s): Limited activity within patient's tolerance    Home Living                      Prior Function            PT Goals (current goals can now be found in the care plan section)      Frequency    7X/week      PT Plan      Co-evaluation              AM-PAC PT "6 Clicks" Mobility   Outcome Measure  Help needed turning from your back to your side while in a flat bed without using bedrails?: A Lot Help needed moving from lying on your back to sitting on the side of a flat bed without using bedrails?: A Lot Help needed moving to and from a bed to a chair (including a  wheelchair)?: Total Help needed standing up from a chair using your arms (e.g., wheelchair or bedside chair)?: Total Help needed to walk in hospital room?: Total Help needed climbing 3-5 steps with a railing? : Total 6 Click Score: 8    End of Session Equipment Utilized During Treatment: Oxygen Activity Tolerance: Patient limited by lethargy Patient left: in bed;with bed alarm set;with call bell/phone within reach Nurse Communication: Mobility status PT Visit Diagnosis: Muscle weakness (generalized) (M62.81);Other abnormalities of gait and mobility (R26.89);History of falling (Z91.81);Pain Pain - Right/Left: Left Pain - part of body: Hip     Time: 7035-0093 PT Time Calculation (min) (ACUTE ONLY): 14 min  Charges:                        Carl [name], PTA   Carl [name] 09/12/2020, 10:19 AM

## 2020-09-12 NOTE — TOC Initial Note (Signed)
P[facility] of Care [facility]) - Initial/Assessment Note    Patient Details  Name: Carl [name] MRN: [id number] Date of Birth: 04-29-1936  [facility] CM/SW Contact:    Shelbie Hutching, RN Phone Number: 09/12/2020, 1:55 PM  Clinical Narrative:                 Patient admitted to the hospital with severe sepsis and pneumonia.  Patient recently discharged on 2/11.  Patient was discharged last time to [facility] for short term rehab.  RNCM met with patient and the patient's son, Oluwatobi at the bedside.  Dyquan feels that patient looks much better than yesterday and family would like to see how the patient does day to day with a goal of improvement and ability to return to [facility] and complete short term rehab.   RNCM will follow and start SNF workup, unable to anticipate when patient may be ready for discharge.   Expected Discharge Plan: Skilled Nursing Facility Barriers to Discharge: Continued Medical Work up   Patient Goals and CMS Choice Patient states their goals for this hospitalization and ongoing recovery are:: Family and patient would like to get back to short term rehab and then long term goal to get back home CMS Medicare.gov Compare Post Acute Care list provided to:: Patient Represenative (must comment) Choice offered to / list presented to : Adult Children  Expected Discharge Plan and Services Expected Discharge Plan: [place]   Discharge Planning Services: CM Consult Post Acute Care Choice: [place] Living arrangements for the past 2 months: Single Family Home                 DME Arranged: N/A DME Agency: NA       HH Arranged: NA          Prior Living Arrangements/Services Living arrangements for the past 2 months: Single Family Home Lives with:: Adult Children Patient language and need for interpreter reviewed:: Yes Do you feel safe going back to the place where you live?: Yes      Need for Family Participation in  Patient Care: Yes (Comment) (sepsis, pneumonia) Care giver support system in place?: Yes (comment) (children) Current home services: DME (walker, lift chair, hospital bed, shower stool, elevated toilet seat) Criminal Activity/Legal Involvement Pertinent to Current Situation/Hospitalization: No - Comment as needed  Activities of Daily Living      Permission Sought/Granted Permission sought to share information with : Case Manager,Family Chief Financial Officer Permission granted to share information with : Yes, Verbal Permission Granted  Share Information with NAME: Daeshon  Permission granted to share info w AGENCY: [facility]  Permission granted to share info w Relationship: son     Emotional Assessment Appearance:: Appears stated age Attitude/Demeanor/Rapport: Engaged Affect (typically observed): Accepting,Quiet Orientation: : Oriented to Self,Oriented to Place Alcohol / Substance Use: Not Applicable Psych Involvement: No (comment)  Admission diagnosis:  Severe sepsis ([place]) [A41.9, R65.20] Patient Active Problem List   Diagnosis Date Noted  . Severe sepsis ([place]) 09/10/2020  . Acute respiratory failure with hypoxia ([place]) 09/10/2020  . Closed left hip fracture ([place]) 09/02/2020  . Transaminitis 09/02/2020  . AF (paroxysmal atrial fibrillation) ([place]) 09/02/2020  . History of UTI 02/14/2020  . Constipation 02/14/2020  . Iron deficiency anemia 01/17/2020  . Near syncope   . Protein-calorie malnutrition, severe 12/22/2019  . Fall 12/19/2019  . Weight loss 08/30/2019  . Onychomycosis 08/30/2019  . AAA (abdominal aortic aneurysm) ([place]) 08/26/2018  . Kidney stones  06/30/2018  . Allergic rhinitis 04/26/2018  . Urolithiasis 01/05/2018  . Frequent urination 01/01/2018  . Colon polyps 09/15/2017  . GERD (gastroesophageal reflux disease) 09/15/2017  . Wrist pain, acute, right 05/25/2017  . Low back pain 03/24/2017  . Lung nodule seen on imaging study: 7 mm nodule  left midlung 03/13/2017  . Physical deconditioning 03/13/2017  . Normocytic anemia 03/13/2017  . Fall at home, initial encounter 03/12/2017  . Abdominal soreness 03/12/2017  . Generalized weakness 03/12/2017  . Leukocytosis 03/12/2017  . Hyponatremia 03/12/2017  . Orthostatic hypotension 02/16/2017  . Anticoagulated 09/19/2016  . Prostate CA ([place]) 09/04/2016  . Longstanding persistent atrial fibrillation ([place]) 09/04/2016  . Cardiomyopathy, ischemic 07/16/2012  . Chronic kidney disease (CKD), stage III (moderate) ([place]) 07/16/2012  . Essential hypertension 08/05/2011  . Murmur 08/05/2011  . 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 Dyslipidemia 02/04/2011   PCP:  [name], MD Pharmacy:   [id number] [place], [id number]: [phone]: [phone]    Social Determinants of Health (SDOH) Interventions    Readmission Risk Interventions Readmission Risk Prevention Plan 09/04/2020  Transportation Screening Complete  Social Work Consult for [place]Counseling Complete  Medication Review (Press photographer Complete  Some recent data might be hidden

## 2020-09-12 NOTE — Progress Notes (Signed)
SLP Cancellation Note  Patient Details Name: MCKADE GURKA MRN: [id number] DOB: [birth year]   Cancelled treatment:       Reason Eval/Treat Not Completed:  (chart reviewed; consulted Palliative Care NSG re: status). Per chart notes and report, pt continues to present w/ declined functional status and lethargy - per PT note this AM: "Pt resting in bed, lethargic, with wet cough. Pt receiving ice chips upon arrival.". MD following yesterday had ok'd ice chips for pt w/ Son's understanding of the risks for aspiration, per Son.  W/ pt's current status and readmit to the hospital w/ suspected aspiration pnueumonia, and overall chronic medical issues, pt remains at HIGH risk for aspiration of po's and Pulmonary decline. Recommend continued NPO status w/ discussion of GOC w/ pt/family w/ Palliative Care. ST services will continue to monitor pt's status and further POC. Discussed above w/ Palliative Care who agreed.     Orinda Kenner, MS, CCC-SLP Speech Language Pathologist Rehab Services [phone] [facility] 09/12/2020, 1:05 PM

## 2020-09-13 DIAGNOSIS — A419 Sepsis, unspecified organism: Secondary | ICD-10-CM | POA: Diagnosis not present

## 2020-09-13 DIAGNOSIS — J9601 Acute respiratory failure with hypoxia: Secondary | ICD-10-CM | POA: Diagnosis not present

## 2020-09-13 DIAGNOSIS — N1831 Chronic kidney disease, stage 3a: Secondary | ICD-10-CM | POA: Diagnosis not present

## 2020-09-13 DIAGNOSIS — I480 Paroxysmal atrial fibrillation: Secondary | ICD-10-CM | POA: Diagnosis not present

## 2020-09-13 LAB — CBC WITH DIFFERENTIAL/PLATELET
Abs Immature Granulocytes: 0.07 10*3/uL (ref 0.00–0.07)
Basophils Absolute: 0 10*3/uL (ref 0.0–0.1)
Basophils Relative: 0 %
Eosinophils Absolute: 0.1 10*3/uL (ref 0.0–0.5)
Eosinophils Relative: 1 %
HCT: 22.9 % — ABNORMAL LOW (ref 39.0–52.0)
Hemoglobin: 7.1 g/dL — ABNORMAL LOW (ref 13.0–17.0)
Immature Granulocytes: 1 %
Lymphocytes Relative: 4 %
Lymphs Abs: 0.4 10*3/uL — ABNORMAL LOW (ref 0.7–4.0)
MCH: 31.7 pg (ref 26.0–34.0)
MCHC: 31 g/dL (ref 30.0–36.0)
MCV: 102.2 fL — ABNORMAL HIGH (ref 80.0–100.0)
Monocytes Absolute: 0.4 10*3/uL (ref 0.1–1.0)
Monocytes Relative: 4 %
Neutro Abs: 8.3 10*3/uL — ABNORMAL HIGH (ref 1.7–7.7)
Neutrophils Relative %: 90 %
Platelets: 243 10*3/uL (ref 150–400)
RBC: 2.24 MIL/uL — ABNORMAL LOW (ref 4.22–5.81)
RDW: 16.9 % — ABNORMAL HIGH (ref 11.5–15.5)
WBC: 9.2 10*3/uL (ref 4.0–10.5)
nRBC: 0 % (ref 0.0–0.2)

## 2020-09-13 LAB — COMPREHENSIVE METABOLIC PANEL
ALT: 34 U/L (ref 0–44)
AST: 28 U/L (ref 15–41)
Albumin: 1.9 g/dL — ABNORMAL LOW (ref 3.5–5.0)
Alkaline Phosphatase: 64 U/L (ref 38–126)
Anion gap: 7 (ref 5–15)
BUN: 35 mg/dL — ABNORMAL HIGH (ref 8–23)
CO2: 18 mmol/L — ABNORMAL LOW (ref 22–32)
Calcium: 7.7 mg/dL — ABNORMAL LOW (ref 8.9–10.3)
Chloride: 121 mmol/L — ABNORMAL HIGH (ref 98–111)
Creatinine, Ser: 1.12 mg/dL (ref 0.61–1.24)
GFR, Estimated: >60 mL/min (ref >60)
Glucose, Bld: 130 mg/dL — ABNORMAL HIGH (ref 70–99)
Potassium: 3.2 mmol/L — ABNORMAL LOW (ref 3.5–5.1)
Sodium: 146 mmol/L — ABNORMAL HIGH (ref 135–145)
Total Bilirubin: 1.1 mg/dL (ref 0.3–1.2)
Total Protein: 4.6 g/dL — ABNORMAL LOW (ref 6.5–8.1)

## 2020-09-13 LAB — PHOSPHORUS: Phosphorus: 2.1 mg/dL — ABNORMAL LOW (ref 2.5–4.6)

## 2020-09-13 LAB — URINE CULTURE: Culture: >=100000 — AB

## 2020-09-13 LAB — PREPARE RBC (CROSSMATCH)

## 2020-09-13 LAB — LACTIC ACID, PLASMA: Lactic Acid, Venous: 1 mmol/L (ref 0.5–1.9)

## 2020-09-13 LAB — MAGNESIUM: Magnesium: 2 mg/dL (ref 1.7–2.4)

## 2020-09-13 MED ORDER — SODIUM CHLORIDE 0.9 % IV SOLN
INTRAVENOUS | Status: DC | PRN
  Administered 2020-09-13: 17:00:00 250 mL via INTRAVENOUS
  Administered 2020-09-18: 500 mL via INTRAVENOUS

## 2020-09-13 MED ORDER — ROSUVASTATIN CALCIUM 20 MG PO TABS: 40.0000 mg | ORAL_TABLET | Freq: Every day | ORAL | Status: DC

## 2020-09-13 MED ORDER — SODIUM CHLORIDE 0.9% IV SOLUTION: Freq: Once | INTRAVENOUS | Status: AC

## 2020-09-13 MED ORDER — POTASSIUM CHLORIDE 10 MEQ/100ML IV SOLN
10.0000 meq | INTRAVENOUS | Status: AC
  Administered 2020-09-13 (×2): 10 meq via INTRAVENOUS
  Filled 2020-09-13 (×2): qty 100

## 2020-09-13 MED ORDER — ENOXAPARIN SODIUM 80 MG/0.8ML ~~LOC~~ SOLN
1.0000 mg/kg | Freq: Two times a day (BID) | SUBCUTANEOUS | Status: DC
  Administered 2020-09-13 – 2020-09-15 (×4): 72.5 mg via SUBCUTANEOUS
  Filled 2020-09-13 (×5): qty 0.8

## 2020-09-13 MED ORDER — TAMSULOSIN HCL 0.4 MG PO CAPS
0.4000 mg | ORAL_CAPSULE | Freq: Every day | ORAL | Status: DC
  Administered 2020-09-15 – 2020-09-18 (×3): 0.4 mg via ORAL
  Filled 2020-09-13 (×6): qty 1

## 2020-09-13 NOTE — Progress Notes (Signed)
PROGRESS NOTE    Carl [name]  ZJI:[id number] DOB: 06-15-1936 DOA: 09/10/2020 PCP: [name], MD     Brief Narrative:  Patient was discharged on February 11 to [facility] after hip fracture surgery. He had speech evaluation done and was recommending dysphagia diet came in with respiratory distress, hypoxia and found to have aspiration pneumonia.   Patient's son in the room. Was feeding ice chips. Evaluated by speech therapist earlier. NPO currently. Patient has lot of gurgling sound unable to protect his airway. Constantly keeps coughing even during talking.   Subjective: 2/17 afebrile overnight A/O x 4, patient states no pain in his left hip   Assessment & Plan: Covid vaccination; vaccinated   Principal Problem:   Severe sepsis ([place]) Active Problems:   Hx of CABG x 5 '06   Dyslipidemia   Essential hypertension   Chronic kidney disease (CKD), stage III (moderate) (HCC)   Prostate CA (HCC)   Longstanding persistent atrial fibrillation (HCC)   Generalized weakness   Physical deconditioning   Low back pain   GERD (gastroesophageal reflux disease)   Iron deficiency anemia   History of UTI   Closed left hip fracture (HCC)   AF (paroxysmal atrial fibrillation) (HCC)   Acute respiratory failure with hypoxia (HCC)   Severe sepsis/Acute hypoxic respiratory failure with hypoxia -On admission met criteria for severe sepsis leukocytosis to 15K , T-max 101.4, respiratory rate into the 20s, lactic acid elevated to 3.3. -CT scan clearly shows that patient has had chronic aspiration, with loss of most of the RLL, RML completely occluded, RUL involved.  In addition LLL with opacification -2/16 per son not on O2 prior to hospitalization. -2/16 currently requiring O2 to maintain appropriate SPO2 see below -Complete 7-day course antibiotics -Discussed at length with son that this is going to be an ongoing process, will not fully resolve and most likely will be the  death of his father at some point in the future. -Lactic acidosis resolved  Bilateral aspiration pneumonia -See severe sepsis -2/17 spoke at length with son and daughter explained that father was now much more alert and interactive and we should repeat swallow evaluation, hopefully he will pass.  If he does not then we will address treatment options going forward.  Dysphagia --patient was seen by speech therapist. Recommends patient to be NPO. Very high risk for aspiration. -2/17 swallow study reevaluation pending  Physical deconditioning -Chronic -Recent admission for left hip fracture status post left knee arthroplasty -2/17 patient much more alert and interactive PT/OT consult; work with patient daily, OOB to chair as tolerated.    CKD stage II (baseline Cr 1.2) -Continue hydration  Hypovolemic hypernatremia Lab Results  Component Value Date   NA 146 (H) 09/13/2020   NA 148 (H) 09/11/2020   NA 149 (H) 09/10/2020   NA 134 (L) 09/05/2020   NA 134 (L) 09/04/2020  -2/16 D5W 156m/hr; improving continue -Avoid nephrotoxic agents  Transaminitis --AST 63, ALT 69, this is improved from previous. T bili of 2.5 is new. Suspect this to be in the setting of severe sepsis. -Normalized  A. Fib -home amiodarone and Eliquis(hold) ---> Lovenox -2/17 NSR  Essential HTN -See A. fib  CAD s/p CABGx  5 in  2006 -See A. fib  Hyperlipidemia -Crestor 40 mg daily (hold) until repeat swallow study  GERD -on  PPI  Iron deficiency anemia -Hemoglobin is currently stable at 7.5 -Transfuse for hemoglobin<7 Lab Results  Component Value Date   HGB 7.1 (  L) 09/13/2020   HGB 7.6 (L) 09/11/2020   HGB 7.5 (L) 09/10/2020   HGB 7.7 (L) 09/07/2020   HGB 8.0 (L) 09/05/2020  -2/17 transfuse 1 unit PRBC  Chronic low back pain -prn IV morphine since pt is NPO  Prostate cancer -on  home darolutamide (holding --NPO)  UTI positive Pseudomonas aeruginosa -Current antibiotics will  cover  Hypokalemia -Potassium IV 50 mEq  Hypocalcemia -2/16 corrected calcium 9.16     Goals of care -Had a long discussion with patient's son in the room. He understands patient has a poor prognosis. Patient is DNR. Son wants to discuss with other family members.  -2/16 palliative care discussed outcome with patient son, son wants to discuss with his family members.   DVT prophylaxis: Lovenox Code Status: DNR Family Communication: 2/17  spoke at length with son and daughter explained that father was now much more alert and interactive and we should repeat swallow evaluation, Status is: Inpatient    Dispo: The patient is from: Home              Anticipated d/c is to: SNF vs hospice              Anticipated d/c date is:??              Patient currently extremely unstable      Consultants:  Palliative care   Procedures/Significant Events:  2/7 left   hip hemiarthroplasty with [employer] Accolade prosthesis --------------------------------------------------------------------------------------------------------------------------------   I have personally reviewed and interpreted all radiology studies and my findings are as above.  VENTILATOR SETTINGS: HFNC 2/17 Flow 4 L/min SPO2 96%   Cultures 2/14 blood LEFT AC NGTD 2/14 RIGHT AC NGTD 2/14 urine positive Pseudomonas aeruginosa   Antimicrobials: Anti-infectives (From admission, onward)   Start     Ordered Stop   09/11/20 2200  vancomycin (VANCOREADY) IVPB 1250 mg/250 mL  Status:  Discontinued        09/10/20 2214 09/11/20 1041   09/11/20 1000  ceFEPIme (MAXIPIME) 2 g in sodium chloride 0.9 % 100 mL IVPB        09/10/20 2205 09/17/20 0959   09/11/20 0200  metroNIDAZOLE (FLAGYL) IVPB 500 mg        09/10/20 2014 09/17/20 0159   09/10/20 2145  vancomycin (VANCOCIN) IVPB 1000 mg/200 mL premix        09/10/20 2144 09/10/20 2314   09/10/20 1745  ceFEPIme (MAXIPIME) 2 g in sodium chloride 0.9 % 100 mL IVPB         09/10/20 1737 09/10/20 1857   09/10/20 1745  metroNIDAZOLE (FLAGYL) IVPB 500 mg        09/10/20 1737 09/10/20 1800   09/10/20 1745  vancomycin (VANCOCIN) IVPB 1000 mg/200 mL premix        09/10/20 1737 09/10/20 1839      Devices    LINES / TUBES:      Continuous Infusions: . ceFEPime (MAXIPIME) IV 2 g (09/13/20 0935)  . dextrose 100 mL/hr at 09/13/20 1116  . metronidazole 500 mg (09/13/20 0853)     Objective: Vitals:   09/13/20 0754 09/13/20 1129 09/13/20 1206 09/13/20 1239  BP: (!) 111/57 102/60 112/62 117/62  Pulse: 69 72 70 70  Resp: '14 18 17 18  ' Temp: 98 F (36.7 C) 98.6 F (37 C) 97.8 F (36.6 C) 97.8 F (36.6 C)  TempSrc: Oral Oral Axillary Axillary  SpO2: 95% 97% 97% 99%  Weight:      Height:  Intake/Output Summary (Last 24 hours) at 09/13/2020 1549 Last data filed at 09/13/2020 8101 Gross per 24 hour  Intake 141.94 ml  Output 1400 ml  Net -1258.06 ml   Filed Weights   09/10/20 1736 09/13/20 0500  Weight: 85 kg 72.3 kg   Physical Exam:  General: A/O x4, positive acute respiratory distress, cachectic Eyes: negative scleral hemorrhage, negative anisocoria, negative icterus ENT: Negative Runny nose, negative gingival bleeding, Neck:  Negative scars, masses, torticollis, lymphadenopathy, JVD Lungs: decreased breath sounds bilaterally, mild rhonchi LLL, without wheezes or crackles.  Overall improved breath sounds. Cardiovascular: Regular rate and rhythm without murmur gallop or rub normal S1 and S2 Abdomen: negative abdominal pain, nondistended, positive soft, bowel sounds, no rebound, no ascites, no appreciable mass Extremities: No significant cyanosis, clubbing, or edema bilateral lower extremities.  LEFT lateral thigh with dressing in place consistent with left   hip hemiarthroplasty  Skin: Negative rashes, lesions, ulcers Psychiatric:  Negative depression, negative anxiety, negative fatigue, negative mania  Central nervous system:  Cranial  nerves II through XII intact, tongue/uvula midline, all extremities muscle strength 5/5, sensation intact throughout,negative dysarthria, negative expressive aphasia, negative receptive aphasia.      Data Reviewed: Care during the described time interval was provided by me .  I have reviewed this patient's available data, including medical history, events of note, physical examination, and all test results as part of my evaluation.  CBC: Recent Labs  Lab 09/07/20 0630 09/10/20 1740 09/11/20 0602 09/13/20 0517  WBC 6.7 15.2* 15.2* 9.2  NEUTROABS  --  14.0*  --  8.3*  HGB 7.7* 7.5* 7.6* 7.1*  HCT 23.7* 23.4* 24.0* 22.9*  MCV 100.4* 100.9* 101.3* 102.2*  PLT 136* 199 215 751   Basic Metabolic Panel: Recent Labs  Lab 09/07/20 0630 09/10/20 1737 09/11/20 0602 09/13/20 0517  NA  --  149* 148* 146*  K  --  5.0 3.3* 3.2*  CL  --  118* 117* 121*  CO2  --  21* 22 18*  GLUCOSE  --  124* 109* 130*  BUN  --  48* 50* 35*  CREATININE  --  1.47* 1.42* 1.12  CALCIUM  --  8.3* 7.8* 7.7*  MG 2.3  --   --  2.0  PHOS  --   --   --  2.1*   GFR: Estimated Creatinine Clearance: 52 mL/min (by C-G formula based on SCr of 1.12 mg/dL). Liver Function Tests: Recent Labs  Lab 09/10/20 1737 09/11/20 0602 09/13/20 0517  AST 63* 40 28  ALT 69* 55* 34  ALKPHOS 79 73 64  BILITOT 2.5* 1.7* 1.1  PROT 5.7* 5.1* 4.6*  ALBUMIN 2.5* 2.3* 1.9*   No results for input(s): LIPASE, AMYLASE in the last 168 hours. No results for input(s): AMMONIA in the last 168 hours. Coagulation Profile: Recent Labs  Lab 09/10/20 1740 09/11/20 0602  INR 2.5* 2.4*   Cardiac Enzymes: No results for input(s): CKTOTAL, CKMB, CKMBINDEX, TROPONINI in the last 168 hours. BNP (last 3 results) No results for input(s): PROBNP in the last 8760 hours. HbA1C: No results for input(s): HGBA1C in the last 72 hours. CBG: No results for input(s): GLUCAP in the last 168 hours. Lipid Profile: No results for input(s): CHOL,  HDL, LDLCALC, TRIG, CHOLHDL, LDLDIRECT in the last 72 hours. Thyroid Function Tests: No results for input(s): TSH, T4TOTAL, FREET4, T3FREE, THYROIDAB in the last 72 hours. Anemia Panel: Recent Labs    09/10/20 2013 09/11/20 0602  VITAMINB12  --  425  FOLATE 16.7  --    Sepsis Labs: Recent Labs  Lab 09/10/20 1737 09/10/20 2210 09/11/20 0602 09/13/20 0517  PROCALCITON  --   --  0.87  --   LATICACIDVEN 3.3* 1.3  --  1.0    Recent Results (from the past 240 hour(s))  SARS CORONAVIRUS 2 (TAT 6-24 HRS) Nasopharyngeal Nasopharyngeal Swab     Status: None   Collection Time: 09/06/20  3:45 PM   Specimen: Nasopharyngeal Swab  Result Value Ref Range Status   SARS Coronavirus 2 NEGATIVE NEGATIVE Final    Comment: (NOTE) SARS-CoV-2 target nucleic acids are NOT DETECTED.  The SARS-CoV-2 RNA is generally detectable in upper and lower respiratory specimens during the acute phase of infection. Negative results do not preclude SARS-CoV-2 infection, do not rule out co-infections with other pathogens, and should not be used as the sole basis for treatment or other patient management decisions. Negative results must be combined with clinical observations, patient history, and epidemiological information. The expected result is Negative.  Fact Sheet for Patients: SugarRoll.be  Fact Sheet for Healthcare Providers: https://www.-mathews.com/  This test is not yet approved or cleared by the [place] FDA and  has been authorized for detection and/or diagnosis of SARS-CoV-2 by FDA under an Emergency Use Authorization (EUA). This EUA will remain  in effect (meaning this test can be used) for the duration of the COVID-19 declaration under Se ction 564(b)(1) of the Act, 21 U.S.C. section 360bbb-3(b)(1), unless the authorization is terminated or revoked sooner.  Performed at [facility], [place] [address] [place], [place] [address]    Blood Culture (routine x 2)     Status: None (Preliminary result)   Collection Time: 09/10/20  5:37 PM   Specimen: BLOOD  Result Value Ref Range Status   Specimen Description BLOOD LEFT ANTECUBITAL  Final   Special Requests   Final    BOTTLES DRAWN AEROBIC AND ANAEROBIC Blood Culture adequate volume   Culture   Final    NO GROWTH 3 DAYS Performed at [facility], [address] [place], [place] [address]    Report Status PENDING  Incomplete  Resp Panel by RT-PCR (Flu A&B, Covid) Nasopharyngeal Swab     Status: None   Collection Time: 09/10/20  5:39 PM   Specimen: Nasopharyngeal Swab; Nasopharyngeal(NP) swabs in vial transport medium  Result Value Ref Range Status   SARS Coronavirus 2 by RT PCR NEGATIVE NEGATIVE Final    Comment: (NOTE) SARS-CoV-2 target nucleic acids are NOT DETECTED.  The SARS-CoV-2 RNA is generally detectable in upper respiratory specimens during the acute phase of infection. The lowest concentration of SARS-CoV-2 viral copies this assay can detect is 138 copies/mL. A negative result does not preclude SARS-Cov-2 infection and should not be used as the sole basis for treatment or other patient management decisions. A negative result may occur with  improper specimen collection/handling, submission of specimen other than nasopharyngeal swab, presence of viral mutation(s) within the areas targeted by this assay, and inadequate number of viral copies(<138 copies/mL). A negative result must be combined with clinical observations, patient history, and epidemiological information. The expected result is Negative.  Fact Sheet for Patients:  EntrepreneurPulse.com.au  Fact Sheet for Healthcare Providers:  IncredibleEmployment.be  This test is no t yet approved or cleared by the [place] FDA and  has been authorized for detection and/or diagnosis of SARS-CoV-2 by FDA under an Emergency Use Authorization (EUA). This  EUA will remain  in effect (  meaning this test can be used) for the duration of the COVID-19 declaration under Section 564(b)(1) of the Act, 21 U.S.C.section 360bbb-3(b)(1), unless the authorization is terminated  or revoked sooner.       Influenza A by PCR NEGATIVE NEGATIVE Final   Influenza B by PCR NEGATIVE NEGATIVE Final    Comment: (NOTE) The Xpert Xpress SARS-CoV-2/FLU/RSV plus assay is intended as an aid in the diagnosis of influenza from Nasopharyngeal swab specimens and should not be used as a sole basis for treatment. Nasal washings and aspirates are unacceptable for Xpert Xpress SARS-CoV-2/FLU/RSV testing.  Fact Sheet for Patients: EntrepreneurPulse.com.au  Fact Sheet for Healthcare Providers: IncredibleEmployment.be  This test is not yet approved or cleared by the [place] FDA and has been authorized for detection and/or diagnosis of SARS-CoV-2 by FDA under an Emergency Use Authorization (EUA). This EUA will remain in effect (meaning this test can be used) for the duration of the COVID-19 declaration under Section 564(b)(1) of the Act, 21 U.S.C. section 360bbb-3(b)(1), unless the authorization is terminated or revoked.  Performed at [facility], [address] [place], [place] [address]   Blood Culture (routine x 2)     Status: None (Preliminary result)   Collection Time: 09/10/20  5:42 PM   Specimen: BLOOD  Result Value Ref Range Status   Specimen Description BLOOD RIGHT ANTECUBITAL  Final   Special Requests   Final    BOTTLES DRAWN AEROBIC AND ANAEROBIC Blood Culture adequate volume   Culture   Final    NO GROWTH 3 DAYS Performed at [facility], [address] [place], [place] [address]    Report Status PENDING  Incomplete  Urine culture     Status: Abnormal   Collection Time: 09/10/20 11:34 PM   Specimen: Urine, Random  Result Value Ref Range Status   Specimen Description   Final    URINE,  RANDOM Performed at [facility], [address] [place], [place] [address]    Special Requests   Final    NONE Performed at [facility], [address] [place], [place] [address]    Culture >=100,000 COLONIES/mL PSEUDOMONAS AERUGINOSA (A)  Final   Report Status 09/13/2020 FINAL  Final   Organism ID, Bacteria PSEUDOMONAS AERUGINOSA (A)  Final      Susceptibility   Pseudomonas aeruginosa - MIC*    CEFTAZIDIME 4 SENSITIVE Sensitive     CIPROFLOXACIN <=0.25 SENSITIVE Sensitive     GENTAMICIN 2 SENSITIVE Sensitive     IMIPENEM 2 SENSITIVE Sensitive     PIP/TAZO 8 SENSITIVE Sensitive     CEFEPIME 4 SENSITIVE Sensitive     * >=100,000 COLONIES/mL PSEUDOMONAS AERUGINOSA         Radiology Studies: No results found.      Scheduled Meds: . enoxaparin (LOVENOX) injection  1 mg/kg Subcutaneous Q12H  . sodium chloride flush  3 mL Intravenous Q12H   Continuous Infusions: . ceFEPime (MAXIPIME) IV 2 g (09/13/20 0935)  . dextrose 100 mL/hr at 09/13/20 1116  . metronidazole 500 mg (09/13/20 0853)     LOS: 3 days    Time spent:40 min    Surie Suchocki, Geraldo Docker, MD [facility]   If 7PM-7AM, please contact night-coverage 09/13/2020, 3:49 PM

## 2020-09-13 NOTE — Progress Notes (Signed)
PT Cancellation Note  Patient Details Name: Carl [name] MRN: [id number] DOB: June 25, 1936   Cancelled Treatment:     Treatment held, HgB continues to trend downward, currently at 7.1 and awaiting transfusion. Will check back next available date for continued PT services.    [name] 09/13/2020, 9:37 AM

## 2020-09-13 NOTE — Progress Notes (Incomplete)
PROGRESS NOTE    Carl [name]  IZT:[id number] DOB: 13-Mar-1936 DOA: 09/10/2020 PCP: [name], MD     Brief Narrative:  Patient was discharged on February 11 to [facility] after hip fracture surgery. He had speech evaluation done and was recommending dysphagia diet came in with respiratory distress, hypoxia and found to have aspiration pneumonia.   Patient's son in the room. Was feeding ice chips. Evaluated by speech therapist earlier. NPO currently. Patient has lot of gurgling sound unable to protect his airway. Constantly keeps coughing even during talking.   Subjective: 2/17 afebrile overnight,   afebrile overnight A/O x0 (per son just received morphine for pain).   Assessment & Plan: Covid vaccination; vaccinated   Principal Problem:   Severe sepsis ([place]) Active Problems:   Hx of CABG x 5 '06   Dyslipidemia   Essential hypertension   Chronic kidney disease (CKD), stage III (moderate) (HCC)   Prostate CA (HCC)   Longstanding persistent atrial fibrillation (HCC)   Generalized weakness   Physical deconditioning   Low back pain   GERD (gastroesophageal reflux disease)   Iron deficiency anemia   History of UTI   Closed left hip fracture (HCC)   AF (paroxysmal atrial fibrillation) (HCC)   Acute respiratory failure with hypoxia (HCC)   Severe sepsis/Acute hypoxic respiratory failure with hypoxia -On admission met criteria for severe sepsis leukocytosis to 15K , T-max 101.4, respiratory rate into the 20s, lactic acid elevated to 3.3. -CT scan clearly shows that patient has had chronic aspiration, with loss of most of the RLL, RML completely occluded, RUL involved.  In addition LLL with opacification -2/16 per son not on O2 prior to hospitalization. -2/16 currently requiring O2 to maintain appropriate SPO2 see below -Complete 7-day course antibiotics -Discussed at length with son that this is going to be an ongoing process, will not fully resolve and  most likely will be the death of his father at some point in the future. -Lactic acidosis resolved  Bilateral aspiration pneumonia -See severe sepsis  Dysphagia --patient was seen by speech therapist. Recommends patient to be NPO. Very high risk for aspiration.  Physical deconditioning -Chronic -Recent admission for left hip fracture status post left knee arthroplasty -Continue PT as tolerated  CKD stage II (baseline Cr 1.2) -Continue hydration  Hypovolemic hypernatremia Lab Results  Component Value Date   NA 146 (H) 09/13/2020   NA 148 (H) 09/11/2020   NA 149 (H) 09/10/2020   NA 134 (L) 09/05/2020   NA 134 (L) 09/04/2020  -2/16 D5W 152m/hr -Avoid nephrotoxic agents  Transaminitis --AST 63, ALT 69, this is improved from previous. T bili of 2.5 is new. Suspect this to be in the setting of severe sepsis. -Continue to monitor for now  A. Fib -home amiodarone and Eliquis(hold) ---> Lovenox -2/16 NSR  Essential HTN   CAD s/p CABGx  5 in  2006  Hyperlipidemia  GERD -on  PPI  Iron deficiency anemia***** -Hemoglobin is currently stable at 7.5 -Continue iron supplementation -Transfuse for hemoglobin<7 -2/17 transfuse 1 unit PRBC -Occult blood pending  Chronic low back pain -prn IV morphine since pt is NPO  Prostate cancer -on  home darolutamide (holding --NPO)  Hypokalemia******* -2/17 potassium IV 40 mEq x 2  Hypocalcemia -2/16 corrected calcium 9.16     Goals of care -Had a long discussion with patient's son in the room. He understands patient has a poor prognosis. Patient is DNR. Son wants to discuss with other family members.  -  2/16 palliative care discussed outcome with patient son, son wants to discuss with his family members.   DVT prophylaxis: Lovenox Code Status: DNR Family Communication: 2/16 son at bedside for discussion of plan of care answered all questions Status is: Inpatient    Dispo: The patient is from: Home               Anticipated d/c is to: SNF vs hospice              Anticipated d/c date is:??              Patient currently extremely unstable      Consultants:  Palliative care   Procedures/Significant Events:  2/17 transfuse 1 unit PRBC   I have personally reviewed and interpreted all radiology studies and my findings are as above.  VENTILATOR SETTINGS: Nasal cannula 2/16 Flow 4 L/min SPO2 95%   Cultures   Antimicrobials: Anti-infectives (From admission, onward)   Start     Dose/Rate Route Frequency Ordered Stop   09/11/20 2200  vancomycin (VANCOREADY) IVPB 1250 mg/250 mL  Status:  Discontinued        1,250 mg 166.7 mL/hr over 90 Minutes Intravenous Every 24 hours 09/10/20 2214 09/11/20 1041   09/11/20 1000  ceFEPIme (MAXIPIME) 2 g in sodium chloride 0.9 % 100 mL IVPB        2 g 200 mL/hr over 30 Minutes Intravenous Every 12 hours 09/10/20 2205     09/11/20 0200  metroNIDAZOLE (FLAGYL) IVPB 500 mg        500 mg 100 mL/hr over 60 Minutes Intravenous Every 8 hours 09/10/20 2014     09/10/20 2145  vancomycin (VANCOCIN) IVPB 1000 mg/200 mL premix        1,000 mg 200 mL/hr over 60 Minutes Intravenous  Once 09/10/20 2144 09/10/20 2314   09/10/20 1745  ceFEPIme (MAXIPIME) 2 g in sodium chloride 0.9 % 100 mL IVPB        2 g 200 mL/hr over 30 Minutes Intravenous  Once 09/10/20 1737 09/10/20 1857   09/10/20 1745  metroNIDAZOLE (FLAGYL) IVPB 500 mg        500 mg 100 mL/hr over 60 Minutes Intravenous  Once 09/10/20 1737 09/10/20 1800   09/10/20 1745  vancomycin (VANCOCIN) IVPB 1000 mg/200 mL premix        1,000 mg 200 mL/hr over 60 Minutes Intravenous  Once 09/10/20 1737 09/10/20 1839       Devices    LINES / TUBES:      Continuous Infusions: . ceFEPime (MAXIPIME) IV 2 g (09/12/20 2137)  . dextrose 100 mL/hr at 09/13/20 0130  . metronidazole 500 mg (09/13/20 0132)     Objective: Vitals:   09/12/20 2358 09/13/20 0413 09/13/20 0500 09/13/20 0754  BP: (!)  124/59 117/64  (!) 111/57  Pulse: 78 78  69  Resp: (!) '22 18  14  ' Temp: 97.8 F (36.6 C) 97.6 F (36.4 C)  98 F (36.7 C)  TempSrc: Oral Oral  Oral  SpO2: (!) 89% 95%  95%  Weight:   72.3 kg   Height:        Intake/Output Summary (Last 24 hours) at 09/13/2020 0828 Last data filed at 09/13/2020 9211 Gross per 24 hour  Intake 141.94 ml  Output 1400 ml  Net -1258.06 ml   Filed Weights   09/10/20 1736 09/13/20 0500  Weight: 85 kg 72.3 kg    Examination:  General: A/O x0, positive acute respiratory  distress, cachectic Eyes: negative scleral hemorrhage, negative anisocoria, negative icterus ENT: Negative Runny nose, negative gingival bleeding, Neck:  Negative scars, masses, torticollis, lymphadenopathy, JVD Lungs: decreased breath sounds auscultation bilaterally without wheezes or crackles, positive rhonchi left lower lobe Cardiovascular: Regular rate and rhythm without murmur gallop or rub normal S1 and S2 Abdomen: negative abdominal pain, nondistended, positive soft, bowel sounds, no rebound, no ascites, no appreciable mass Extremities: No significant cyanosis, clubbing, or edema bilateral lower extremities Skin: Negative rashes, lesions, ulcers Psychiatric: Unable to evaluate patient just received morphine Central nervous system: Unable to evaluate patient just received morphine .     Data Reviewed: Care during the described time interval was provided by me .  I have reviewed this patient's available data, including medical history, events of note, physical examination, and all test results as part of my evaluation.  CBC: Recent Labs  Lab 09/07/20 0630 09/10/20 1740 09/11/20 0602 09/13/20 0517  WBC 6.7 15.2* 15.2* 9.2  NEUTROABS  --  14.0*  --  8.3*  HGB 7.7* 7.5* 7.6* 7.1*  HCT 23.7* 23.4* 24.0* 22.9*  MCV 100.4* 100.9* 101.3* 102.2*  PLT 136* 199 215 094   Basic Metabolic Panel: Recent Labs  Lab 09/07/20 0630 09/10/20 1737 09/11/20 0602 09/13/20 0517  NA   --  149* 148* 146*  K  --  5.0 3.3* 3.2*  CL  --  118* 117* 121*  CO2  --  21* 22 18*  GLUCOSE  --  124* 109* 130*  BUN  --  48* 50* 35*  CREATININE  --  1.47* 1.42* 1.12  CALCIUM  --  8.3* 7.8* 7.7*  MG 2.3  --   --  2.0  PHOS  --   --   --  2.1*   GFR: Estimated Creatinine Clearance: 52 mL/min (by C-G formula based on SCr of 1.12 mg/dL). Liver Function Tests: Recent Labs  Lab 09/10/20 1737 09/11/20 0602 09/13/20 0517  AST 63* 40 28  ALT 69* 55* 34  ALKPHOS 79 73 64  BILITOT 2.5* 1.7* 1.1  PROT 5.7* 5.1* 4.6*  ALBUMIN 2.5* 2.3* 1.9*   No results for input(s): LIPASE, AMYLASE in the last 168 hours. No results for input(s): AMMONIA in the last 168 hours. Coagulation Profile: Recent Labs  Lab 09/10/20 1740 09/11/20 0602  INR 2.5* 2.4*   Cardiac Enzymes: No results for input(s): CKTOTAL, CKMB, CKMBINDEX, TROPONINI in the last 168 hours. BNP (last 3 results) No results for input(s): PROBNP in the last 8760 hours. HbA1C: No results for input(s): HGBA1C in the last 72 hours. CBG: No results for input(s): GLUCAP in the last 168 hours. Lipid Profile: No results for input(s): CHOL, HDL, LDLCALC, TRIG, CHOLHDL, LDLDIRECT in the last 72 hours. Thyroid Function Tests: No results for input(s): TSH, T4TOTAL, FREET4, T3FREE, THYROIDAB in the last 72 hours. Anemia Panel: Recent Labs    09/10/20 2013 09/11/20 0602  VITAMINB12  --  425  FOLATE 16.7  --    Sepsis Labs: Recent Labs  Lab 09/10/20 1737 09/10/20 2210 09/11/20 0602 09/13/20 0517  PROCALCITON  --   --  0.87  --   LATICACIDVEN 3.3* 1.3  --  1.0    Recent Results (from the past 240 hour(s))  SARS CORONAVIRUS 2 (TAT 6-24 HRS) Nasopharyngeal Nasopharyngeal Swab     Status: None   Collection Time: 09/06/20  3:45 PM   Specimen: Nasopharyngeal Swab  Result Value Ref Range Status   SARS Coronavirus 2 NEGATIVE NEGATIVE Final  Comment: (NOTE) SARS-CoV-2 target nucleic acids are NOT DETECTED.  The SARS-CoV-2  RNA is generally detectable in upper and lower respiratory specimens during the acute phase of infection. Negative results do not preclude SARS-CoV-2 infection, do not rule out co-infections with other pathogens, and should not be used as the sole basis for treatment or other patient management decisions. Negative results must be combined with clinical observations, patient history, and epidemiological information. The expected result is Negative.  Fact Sheet for Patients: SugarRoll.be  Fact Sheet for Healthcare Providers: https://www.-mathews.com/  This test is not yet approved or cleared by the [place] FDA and  has been authorized for detection and/or diagnosis of SARS-CoV-2 by FDA under an Emergency Use Authorization (EUA). This EUA will remain  in effect (meaning this test can be used) for the duration of the COVID-19 declaration under Se ction 564(b)(1) of the Act, 21 U.S.C. section 360bbb-3(b)(1), unless the authorization is terminated or revoked sooner.  Performed at [facility], [place] [address], [place], [place] [address]   Blood Culture (routine x 2)     Status: None (Preliminary result)   Collection Time: 09/10/20  5:37 PM   Specimen: BLOOD  Result Value Ref Range Status   Specimen Description BLOOD LEFT ANTECUBITAL  Final   Special Requests   Final    BOTTLES DRAWN AEROBIC AND ANAEROBIC Blood Culture adequate volume   Culture   Final    NO GROWTH 3 DAYS Performed at [facility], [address] [place], [place] [address]    Report Status PENDING  Incomplete  Resp Panel by RT-PCR (Flu A&B, Covid) Nasopharyngeal Swab     Status: None   Collection Time: 09/10/20  5:39 PM   Specimen: Nasopharyngeal Swab; Nasopharyngeal(NP) swabs in vial transport medium  Result Value Ref Range Status   SARS Coronavirus 2 by RT PCR NEGATIVE NEGATIVE Final    Comment: (NOTE) SARS-CoV-2 target nucleic acids are NOT  DETECTED.  The SARS-CoV-2 RNA is generally detectable in upper respiratory specimens during the acute phase of infection. The lowest concentration of SARS-CoV-2 viral copies this assay can detect is 138 copies/mL. A negative result does not preclude SARS-Cov-2 infection and should not be used as the sole basis for treatment or other patient management decisions. A negative result may occur with  improper specimen collection/handling, submission of specimen other than nasopharyngeal swab, presence of viral mutation(s) within the areas targeted by this assay, and inadequate number of viral copies(<138 copies/mL). A negative result must be combined with clinical observations, patient history, and epidemiological information. The expected result is Negative.  Fact Sheet for Patients:  EntrepreneurPulse.com.au  Fact Sheet for Healthcare Providers:  IncredibleEmployment.be  This test is no t yet approved or cleared by the [place] FDA and  has been authorized for detection and/or diagnosis of SARS-CoV-2 by FDA under an Emergency Use Authorization (EUA). This EUA will remain  in effect (meaning this test can be used) for the duration of the COVID-19 declaration under Section 564(b)(1) of the Act, 21 U.S.C.section 360bbb-3(b)(1), unless the authorization is terminated  or revoked sooner.       Influenza A by PCR NEGATIVE NEGATIVE Final   Influenza B by PCR NEGATIVE NEGATIVE Final    Comment: (NOTE) The Xpert Xpress SARS-CoV-2/FLU/RSV plus assay is intended as an aid in the diagnosis of influenza from Nasopharyngeal swab specimens and should not be used as a sole basis for treatment. Nasal washings and aspirates are unacceptable for Xpert Xpress SARS-CoV-2/FLU/RSV  testing.  Fact Sheet for Patients: EntrepreneurPulse.com.au  Fact Sheet for Healthcare Providers: IncredibleEmployment.be  This test is not yet  approved or cleared by the [place] FDA and has been authorized for detection and/or diagnosis of SARS-CoV-2 by FDA under an Emergency Use Authorization (EUA). This EUA will remain in effect (meaning this test can be used) for the duration of the COVID-19 declaration under Section 564(b)(1) of the Act, 21 U.S.C. section 360bbb-3(b)(1), unless the authorization is terminated or revoked.  Performed at [facility], [address] [place], [place] [address]   Blood Culture (routine x 2)     Status: None (Preliminary result)   Collection Time: 09/10/20  5:42 PM   Specimen: BLOOD  Result Value Ref Range Status   Specimen Description BLOOD RIGHT ANTECUBITAL  Final   Special Requests   Final    BOTTLES DRAWN AEROBIC AND ANAEROBIC Blood Culture adequate volume   Culture   Final    NO GROWTH 3 DAYS Performed at [facility], [address], [place], [place] [address]    Report Status PENDING  Incomplete  Urine culture     Status: Abnormal   Collection Time: 09/10/20 11:34 PM   Specimen: Urine, Random  Result Value Ref Range Status   Specimen Description   Final    URINE, RANDOM Performed at [facility], [address] [place], [place] [address]    Special Requests   Final    NONE Performed at [facility], [address] [place], [place] [address]    Culture >=100,000 COLONIES/mL PSEUDOMONAS AERUGINOSA (A)  Final   Report Status 09/13/2020 FINAL  Final   Organism ID, Bacteria PSEUDOMONAS AERUGINOSA (A)  Final      Susceptibility   Pseudomonas aeruginosa - MIC*    CEFTAZIDIME 4 SENSITIVE Sensitive     CIPROFLOXACIN <=0.25 SENSITIVE Sensitive     GENTAMICIN 2 SENSITIVE Sensitive     IMIPENEM 2 SENSITIVE Sensitive     PIP/TAZO 8 SENSITIVE Sensitive     CEFEPIME 4 SENSITIVE Sensitive     * >=100,000 COLONIES/mL PSEUDOMONAS AERUGINOSA         Radiology Studies: No results found.      Scheduled Meds: . enoxaparin (LOVENOX)  injection  1 mg/kg Subcutaneous Q12H  . sodium chloride flush  3 mL Intravenous Q12H   Continuous Infusions: . ceFEPime (MAXIPIME) IV 2 g (09/12/20 2137)  . dextrose 100 mL/hr at 09/13/20 0130  . metronidazole 500 mg (09/13/20 0132)     LOS: 3 days    Time spent:40 min    Kattleya Kuhnert, Geraldo Docker, MD [facility]   If 7PM-7AM, please contact night-coverage 09/13/2020, 8:28 AM

## 2020-09-14 DIAGNOSIS — J690 Pneumonitis due to inhalation of food and vomit: Secondary | ICD-10-CM | POA: Diagnosis not present

## 2020-09-14 DIAGNOSIS — N3080 Other cystitis without hematuria: Secondary | ICD-10-CM | POA: Diagnosis not present

## 2020-09-14 DIAGNOSIS — E876 Hypokalemia: Secondary | ICD-10-CM

## 2020-09-14 DIAGNOSIS — B965 Pseudomonas (aeruginosa) (mallei) (pseudomallei) as the cause of diseases classified elsewhere: Secondary | ICD-10-CM

## 2020-09-14 DIAGNOSIS — R1310 Dysphagia, unspecified: Secondary | ICD-10-CM | POA: Diagnosis not present

## 2020-09-14 DIAGNOSIS — A419 Sepsis, unspecified organism: Secondary | ICD-10-CM | POA: Diagnosis not present

## 2020-09-14 DIAGNOSIS — E870 Hyperosmolality and hypernatremia: Secondary | ICD-10-CM

## 2020-09-14 LAB — CBC WITH DIFFERENTIAL/PLATELET
Abs Immature Granulocytes: 0.17 10*3/uL — ABNORMAL HIGH (ref 0.00–0.07)
Basophils Absolute: 0 10*3/uL (ref 0.0–0.1)
Basophils Relative: 0 %
Eosinophils Absolute: 0.1 10*3/uL (ref 0.0–0.5)
Eosinophils Relative: 1 %
HCT: 25.9 % — ABNORMAL LOW (ref 39.0–52.0)
Hemoglobin: 8.3 g/dL — ABNORMAL LOW (ref 13.0–17.0)
Immature Granulocytes: 2 %
Lymphocytes Relative: 7 %
Lymphs Abs: 0.6 10*3/uL — ABNORMAL LOW (ref 0.7–4.0)
MCH: 31.2 pg (ref 26.0–34.0)
MCHC: 32 g/dL (ref 30.0–36.0)
MCV: 97.4 fL (ref 80.0–100.0)
Monocytes Absolute: 0.4 10*3/uL (ref 0.1–1.0)
Monocytes Relative: 4 %
Neutro Abs: 7.9 10*3/uL — ABNORMAL HIGH (ref 1.7–7.7)
Neutrophils Relative %: 86 %
Platelets: 263 10*3/uL (ref 150–400)
RBC: 2.66 MIL/uL — ABNORMAL LOW (ref 4.22–5.81)
RDW: 19.6 % — ABNORMAL HIGH (ref 11.5–15.5)
WBC: 9.2 10*3/uL (ref 4.0–10.5)
nRBC: 0 % (ref 0.0–0.2)

## 2020-09-14 LAB — TYPE AND SCREEN
ABO/RH(D): O NEG
Antibody Screen: NEGATIVE
Unit division: 0

## 2020-09-14 LAB — COMPREHENSIVE METABOLIC PANEL
ALT: 26 U/L (ref 0–44)
AST: 18 U/L (ref 15–41)
Albumin: 1.8 g/dL — ABNORMAL LOW (ref 3.5–5.0)
Alkaline Phosphatase: 61 U/L (ref 38–126)
Anion gap: 5 (ref 5–15)
BUN: 25 mg/dL — ABNORMAL HIGH (ref 8–23)
CO2: 18 mmol/L — ABNORMAL LOW (ref 22–32)
Calcium: 7.4 mg/dL — ABNORMAL LOW (ref 8.9–10.3)
Chloride: 118 mmol/L — ABNORMAL HIGH (ref 98–111)
Creatinine, Ser: 1 mg/dL (ref 0.61–1.24)
GFR, Estimated: >60 mL/min (ref >60)
Glucose, Bld: 113 mg/dL — ABNORMAL HIGH (ref 70–99)
Potassium: 3.2 mmol/L — ABNORMAL LOW (ref 3.5–5.1)
Sodium: 141 mmol/L (ref 135–145)
Total Bilirubin: 1 mg/dL (ref 0.3–1.2)
Total Protein: 4.4 g/dL — ABNORMAL LOW (ref 6.5–8.1)

## 2020-09-14 LAB — BPAM RBC
Blood Product Expiration Date: 202202202359
ISSUE DATE / TIME: 202202171212
Unit Type and Rh: 9500

## 2020-09-14 LAB — PHOSPHORUS: Phosphorus: 1.9 mg/dL — ABNORMAL LOW (ref 2.5–4.6)

## 2020-09-14 LAB — MAGNESIUM: Magnesium: 1.7 mg/dL (ref 1.7–2.4)

## 2020-09-14 MED ORDER — POTASSIUM CHLORIDE 10 MEQ/100ML IV SOLN
10.0000 meq | INTRAVENOUS | Status: AC
  Administered 2020-09-14 (×2): 10 meq via INTRAVENOUS
  Filled 2020-09-14: qty 100

## 2020-09-14 MED ORDER — MAGNESIUM SULFATE 2 GM/50ML IV SOLN
2.0000 g | Freq: Once | INTRAVENOUS | Status: AC
  Administered 2020-09-14: 07:00:00 2 g via INTRAVENOUS
  Filled 2020-09-14: qty 50

## 2020-09-14 MED ORDER — POTASSIUM CL IN DEXTROSE 5% 20 MEQ/L IV SOLN
20.0000 meq | INTRAVENOUS | Status: DC
  Administered 2020-09-14: 20 meq via INTRAVENOUS
  Filled 2020-09-14 (×2): qty 1000

## 2020-09-14 MED ORDER — POTASSIUM CL IN DEXTROSE 5% 20 MEQ/L IV SOLN
20.0000 meq | INTRAVENOUS | Status: DC
  Administered 2020-09-14: 20 meq via INTRAVENOUS
  Filled 2020-09-14 (×3): qty 1000

## 2020-09-14 MED ORDER — POTASSIUM PHOSPHATES 15 MMOLE/5ML IV SOLN
20.0000 mmol | Freq: Once | INTRAVENOUS | Status: AC
  Administered 2020-09-14: 16:00:00 20 mmol via INTRAVENOUS
  Filled 2020-09-14: qty 6.67

## 2020-09-14 NOTE — Care Management Important Message (Signed)
Important Message  Patient Details  Name: Carl [name] MRN: [id number] Date of Birth: 1935/09/03   Medicare Important Message Given:  Yes     Juliann Pulse A Alysiah Suppa 09/14/2020, 1:34 PM

## 2020-09-14 NOTE — Progress Notes (Signed)
PT Cancellation Note  Patient Details Name: Carl [name] MRN: [id number] DOB: [birth year]   Cancelled Treatment:     PT attempt. Family members at bedside requesting letting pt rest at this time. Will continue to follow and progress per POC.    Willette Pa 09/14/2020, 9:11 AM

## 2020-09-14 NOTE — TOC Progression Note (Signed)
Transition of Care [facility]) - Progression Note    Patient Details  Name: Carl [name] MRN: [id number] Date of Birth: 05/31/1936  [facility] CM/SW Contact  Shelbie Hutching, RN Phone Number: 09/14/2020, 1:25 PM  Clinical Narrative:    Patient is improving and will start on a diet today.  Family is happy to see the patient looking better and much more alert and interactive.  Plan will be for patient to return to [facility] for rehab once medically cleared, maybe ready for DC early next week.    Expected Discharge Plan: [place] Barriers to Discharge: Continued Medical Work up  Expected Discharge Plan and Services Expected Discharge Plan: Home   Discharge Planning Services: CM Consult Post Acute Care Choice: [place] Living arrangements for the past 2 months: Single Family Home                 DME Arranged: N/A DME Agency: NA       HH Arranged: NA           Social Determinants of Health (SDOH) Interventions    Readmission Risk Interventions Readmission Risk Prevention Plan 09/04/2020  Transportation Screening Complete  Social Work Consult for [place] Planning/Counseling Complete  Medication Review Press photographer) Complete  Some recent data might be hidden

## 2020-09-14 NOTE — Consult Note (Signed)
Pharmacy Antibiotic Note  Carl [name] is a 85 y.o. male admitted on 09/10/2020 with sepsis.  Urine culture positive for Pseudomonas. Pharmacy has been consulted for Cefepime dosing. Day 5 of Antibiotics.  Plan: Will continue Cefepime 2g IV Q 12 hours.   Will adjust dose as renal function improves   Height: 6\' 3"  (190.5 cm) Weight: 72 kg (158 lb 11.7 oz) IBW/kg (Calculated) : 84.5  Temp (24hrs), Avg:98.2 F (36.8 C), Min:97.5 F (36.4 C), Max:98.7 F (37.1 C)  Recent Labs  Lab 09/10/20 1737 09/10/20 1740 09/10/20 2210 09/11/20 0602 09/13/20 0517 09/14/20 0543  WBC  --  15.2*  --  15.2* 9.2 9.2  CREATININE 1.47*  --   --  1.42* 1.12 1.00  LATICACIDVEN 3.3*  --  1.3  --  1.0  --     Estimated Creatinine Clearance: 58 mL/min (by C-G formula based on SCr of 1 mg/dL).    Allergies  Allergen Reactions  . Ciprofloxacin Other (See Comments)    PER HEART DR  . Ibuprofen Other (See Comments)    Kidney issues    Antimicrobials this admission: Cefepime 2/14 >>    Microbiology results: 2/14 BCx: NG x 4 days 2/14 UCx: Pseudomonas  Thank you for allowing pharmacy to be a part of this patient's care.  Pearla Dubonnet 09/14/2020 3:32 PM

## 2020-09-14 NOTE — Progress Notes (Signed)
Speech Language Pathology Treatment: Dysphagia  Patient Details Name: Carl [name] MRN: [id number] DOB: 1936/05/02 Today's Date: 09/14/2020 Time: 2951-8841 SLP Time Calculation (min) (ACUTE ONLY): 55 min  Assessment / Plan / Recommendation Clinical Impression  Pt seen for ongoing assessment of swallowing. He has been NPO for ~2 days to the increased overt s/s of aspiration noted w/ oral intake; increased Pulmonary issues as he was being treated for suspected aspiration pneumonia. See CXR Imaging reports. Today, he appears much improved; min more energy and verbally responsive w/ much clearer speech, less mumbled speech and wet respirations. Pt continues to have a Congested cough at Baseline and expectorates Phlegm. As per MD notes, "patient does have some cough and some gurgling with talking". Family members including Son were present. Pt was eager to have "water" and was insistent he could do "fine" w/ it. Family eager for pt to eat/drink as well. Pt explained general aspiration precautions and agreed verbally to the need for following them especially sitting upright for all oral intake AND taking Small, Single Sips by CUP only. Pt assisted w/ midline positioning d/t weakness then given trials of thin and Nectar liquids, ice chips, purees and soft solids. No immediate, overt clinical s/s of aspiration were noted w/ Nectar liquids and puree/softened solid consistencies; respiratory status remained fairly calm and unlabored, vocal quality clear b/t trials. Pt held own Cup when drinking but required Mod+ verbal/tactile cues for following instructions for single, small sips slowly -- reduced Insight noted. W/ trials of thin liquids, Mod+ verbal/tactile cues required as well. He initially exhibited no immediate overt coughing or s/s of aspiration w/ the single sips, but as he continued w/ trials, congested coughing, throat clearing, and wet vocal quality noted. Rest breaks given and trials attempted  again w/ similar results noted. But, overall much improved management of oral intake and swallowing since presentation at evaluation. NO straws were utiilized for better oral control. Pt encouraged to take breaks intermittently to use dry swallowing and time to regulate breathing if needed. Oral phase appeared grossly [facility] for bolus management, mastication, and timely A-P transfer for swallowing of purees and soft solids(moistened); oral clearing achieved w/ all consistencies w/ min time needed for full mastication and oral clearing of increased textured solids d/t Missing Dentition. Much improved swallowing status today overall. Pt and family members seemed pleased.   Recommend trial upgrade to Dysphagia level 3 diet (mech soft) w/ gravies added to moisten foods; Nectar liquids VIA CUP ONLY. Recommend general aspiration precautions; Pills Whole vs Crushed in Puree; tray setup and positioning assistance for meals. Monitoring at meals for s/s of aspiration and need to stop po's if indicated. ST services will continue to f/u w/ pt for toleration of diet and education as needed w/ MBSS next week b/f full upgrade to thin liquids. MD and NSG updated. Palliative Care updated/following. Precautions posted at bedside. Family agreed.     HPI HPI: [name] with medical history significant for paroxysmal atrial fibrillation on anticoagulation therapy with Eliquis, chronic diastolic dysfunction CHF, abdominal aortic aneurysm, stage III chronic kidney disease and coronary artery disease.  He is currently S/P L hemiarthroplasty and receiving Rehab at facility post d/c from hospital last week.  Unsure of pt's Baseline Cognitive status prior to fall and hip surgery.  Pt was being followed by [facility] w/ Malnutrition, per chart notes.  He presents to the emergency department for shortness of breath from his rehab facility.  According to EMS report patient is currently at [facility] rehab after his left hip replacement.  Patient was noted to be short of breath by staff today and have a room air saturation of 77% and febrile to 101.4.  Upon arrival patient does appear very fatigued and weak however he is able to answer basic questions and is oriented.  States some pain in the left hip but denies any other pain.  Patient is short of breath.  There was concern he might be developing a UTI at the facility. Chest CT: "Multifocal airspace disease consistent with pneumonia, head  suspicious for aspiration. Dense airspace consolidation in the  dependent right upper, middle and lower lobes. Dense but lesser  consolidation in the dependent left lower lobe. Right bronchus  intermedius is completely collapsed or occluded, motion obscures  detailed assessment. Right lower lobe bronchus appears filled.  Additional patchy tree in bud and ground-glass opacities in the more  anterior right lung.".      SLP Plan  Continue with current plan of care (MBSS TBD)       Recommendations  Diet recommendations: Dysphagia 3 (mechanical soft);Nectar-thick liquid Liquids provided via: Cup;No straw Medication Administration: Whole meds with puree (vs need to Crush in Puree) Supervision: Patient able to self feed;Staff to assist with self feeding;Full supervision/cueing for compensatory strategies Compensations: Minimize environmental distractions;Slow rate;Small sips/bites;Lingual sweep for clearance of pocketing;Multiple dry swallows after each bite/sip;Follow solids with liquid Postural Changes and/or Swallow Maneuvers: Seated upright 90 degrees;Upright 30-60 min after meal;Out of bed for meals                General recommendations:  (Dietician f/u; Palliative Care f/u) Oral Care Recommendations: Oral care BID;Oral care before and after PO;Staff/trained caregiver to provide oral care Follow up Recommendations: Skilled Nursing facility SLP Visit Diagnosis: Dysphagia, oropharyngeal phase  (R13.12) Plan: Continue with current plan of care (MBSS TBD)       GO                 Carl Kenner, MS, CCC-SLP Speech Language Pathologist Rehab Services [phone] [facility] 09/14/2020, 5:13 PM

## 2020-09-14 NOTE — Progress Notes (Signed)
PT Cancellation Note  Patient Details Name: Carl [name] MRN: [id number] DOB: 28-May-1936   Cancelled Treatment:      PT attempt. Dr Sabra Heck at bedside. Will return later this afternoon and continue to follow per current POC.   Willette Pa 09/14/2020, 1:52 PM

## 2020-09-14 NOTE — Evaluation (Signed)
Occupational Therapy Evaluation Patient Details Name: Carl [name] MRN: [id number] DOB: 02/23/1936 Today's Date: 09/14/2020    History of Present Illness Pt is an 85 y/o M with PMH: pAFib on Eliquis, dCHF, AAA, stage III CKD, CAD and recent adm to [facility] with fall to L side and was s/p L hemiarthroplasty with Dr. Sabra Heck. Pt re-presented from [facility] where he went after hip sx, d/t SOB. Pt found to have b/l aspiration PNA on imaging and adm with sepsis.   Clinical Impression   Pt seen for OT evaluation this date in setting of acute hospitalization d/t SOB. Pt recently here for hip sx following a fall and d/c'ed to [facility] on 09/07/2020. Pt reports that before the fall he was performing ADLs and fxl mobility with MOD I d/t fear of falling; ex: he was sponge bathing. This date, despite extensive efforts, pt not agreeable to getting OOB during occupational therapy evaluation/session. Strength assessment of UB reveals that while pt with good UB ROM, he is grossly weak. In addition, pt with limited ROM of L LE d/t previous fall/surgery/precautions. Pt is reluctant to move this LE with OT during session, but will participate in some rolling with MAX A for repositioning and comfort. OT engages pt in bed level ADLs with pt currently requiring SETUP to MIN A For UB ADLs and MAX A for LB ADLs. Overall, grossly weak and with decreased fxl activity tolerance which is increasingly impacted by sepsis and recovering from L hip sx. Pt will require continued skilled OT in acute setting to provide ed re: adaptation and modification as needed as well as safety education with ADL mobility/transfers. In addition, anticipate pt will require continued STR f/u at SNF before being able to go back to home environment.    Follow Up Recommendations  SNF    Equipment Recommendations  3 in 1 bedside commode;Tub/shower seat    Recommendations for Other Services       Precautions / Restrictions  Precautions Precautions: Posterior Hip;Fall Precaution Booklet Issued: No Precaution Comments: hip abduction pillow Restrictions Weight Bearing Restrictions: Yes LLE Weight Bearing: Partial weight bearing      Mobility Bed Mobility   Bed Mobility: Rolling Rolling: Max assist         General bed mobility comments: pt not agreeable to getting OOB on OT assessment despite increased time, education and encouragement. OT educates pt re: purpose of therapy, importance of OOB activity, MD's orders and rationale. Pt with moderate reception, but still not willing to participate. He only agrees to rolling in bed for repositioining with MAX A    Transfers                 General transfer comment: pt not agreeable to OOB on OT Evaluation    Balance                                           ADL either performed or assessed with clinical judgement   ADL                                         General ADL Comments: pt requires SETUP to MIN A For seated UB ADLs including oral care, MAX A for LB ADLs.     Vision Patient Visual  Report: No change from baseline       Perception     Praxis      Pertinent Vitals/Pain Pain Assessment: Faces Faces Pain Scale: Hurts little more Pain Location: L hip with ROM Pain Descriptors / Indicators: Grimacing Pain Intervention(s): Limited activity within patient's tolerance;Monitored during session     Hand Dominance Right   Extremity/Trunk Assessment Upper Extremity Assessment Upper Extremity Assessment: Generalized weakness   Lower Extremity Assessment Lower Extremity Assessment: Generalized weakness;LLE deficits/detail LLE: Unable to fully assess due to pain LLE Sensation: WNL       Communication Communication Communication: No difficulties (somewhat drowsy/groggy speech)   Cognition Arousal/Alertness:  (drowsy, but awakens easily and is conversational with therapist) Behavior During  Therapy: Flat affect Overall Cognitive Status: No family/caregiver present to determine baseline cognitive functioning Area of Impairment: Orientation;Memory;Following commands;Problem solving                 Orientation Level: Disoriented to;Time   Memory: Decreased recall of precautions Following Commands: Follows one step commands consistently;Follows one step commands with increased time     Problem Solving: Slow processing;Requires verbal cues;Requires tactile cues General Comments: Pt with grossly improved orientation and general cognition today. Overall improved conversation and attention to task. Pt does require increased processing time, but follows commands appropriately and is better oriented wtih exception of time. And demos better recall of precautions from beginning to end of session   General Comments       Exercises Other Exercises Other Exercises: OT Facilitates ed re: role of OT, importance of OOB Activity, benefits of OOB Activity, and hip precautions. Pt with good understanding, but not agreeable to OOB.   Shoulder Instructions      Home Living Family/patient expects to be discharged to:: Skilled nursing facility Living Arrangements: Children                               Additional Comments: patient's daughter reports he does sponge bathing      Prior Functioning/Environment Level of Independence: Needs assistance  Gait / Transfers Assistance Needed: uses walker at baseline ADL's / Homemaking Assistance Needed: pt reports his family was assisting with ADL;pt is sponge bathing due to fear of falling   Comments: taken from previous encounter 7 days ago.        OT Problem List: Decreased strength;Decreased activity tolerance;Decreased knowledge of precautions;Pain;Increased edema      OT Treatment/Interventions: Self-care/ADL training;DME and/or AE instruction;Therapeutic activities;Balance training;Therapeutic exercise;Energy  conservation;Patient/family education    OT Goals(Current goals can be found in the care plan section) Acute Rehab OT Goals Patient Stated Goal: to go to rehab (from previous eval) OT Goal Formulation: With patient Time For Goal Achievement: 09/28/20 Potential to Achieve Goals: Good ADL Goals Pt Will Perform Lower Body Dressing: with min assist;sit to/from stand (with LRAD and AE PRN) Pt Will Transfer to Toilet: with min assist;stand pivot transfer;bedside commode Pt/caregiver will Perform Home Exercise Program: Increased strength;Both right and left upper extremity;With minimal assist Additional ADL Goal #1: Pt will perform seated self care EOB for 6-7 mins or standing for 1 min with RW to increase fxl activity tolerance.  OT Frequency: Min 1X/week   Barriers to D/C:            Co-evaluation              AM-PAC OT "6 Clicks" Daily Activity     Outcome Measure Help from  another person eating meals?: None Help from another person taking care of personal grooming?: A Little Help from another person toileting, which includes using toliet, bedpan, or urinal?: A Lot Help from another person bathing (including washing, rinsing, drying)?: A Lot Help from another person to put on and taking off regular upper body clothing?: A Little Help from another person to put on and taking off regular lower body clothing?: A Lot 6 Click Score: 16   End of Session    Activity Tolerance: Patient limited by fatigue Patient left: in bed;with call bell/phone within reach;with bed alarm set  OT Visit Diagnosis: Unsteadiness on feet (R26.81);Muscle weakness (generalized) (M62.81)                Time: 3382-5053 OT Time Calculation (min): 38 min Charges:  OT General Charges $OT Visit: 1 Visit OT Evaluation $OT Eval Moderate Complexity: 1 Mod OT Treatments $Self Care/Home Management : 8-22 mins $Therapeutic Activity: 8-22 mins  Gerrianne Scale, MS, OTR/L ascom [phone] 09/14/20, 4:15 PM

## 2020-09-14 NOTE — Progress Notes (Signed)
Patient sitting up in bed, alert and oriented.  Two sons in room finishing conversation with speech path, results and plan regarding the new swallow eval done today.    He is going to be on a thickened liquid diet but food is going to be soft but not pureed or chopped to look pleasing and encourage PO intake.  Feeding will be monitored closely.  I spoke with the sons about palliative care following outpaient at a facility and then possibly follow to home if appropriate in order to monitor stability of condition vs decline.  Sons agreed.  Good to see he has improved. Will sign off for now.  Please order palliative consult at discharge to facility.  Thank you, Kizzie Fantasia, MSN, RN-BC, [facility], [place]

## 2020-09-14 NOTE — NC FL2 (Signed)
[place] LEVEL OF CARE SCREENING TOOL     IDENTIFICATION  Patient Name: Carl [name] Birthdate: 1935-10-17 Sex: male Admission Date (Current Location): 09/10/2020  [place] and [place] Number:  Engineering geologist and Address:  [facility], [address], [place], [place] [address]      Provider Number: [id number]  Attending Physician Name and Address:  Loletha Grayer, MD  Relative Name and Phone Number:  Gwyndolyn Saxon (son)    Current Level of Care: Hospital Recommended Level of Care: [place] Prior Approval Number:    Date Approved/Denied:   PASRR Number:    Discharge Plan: SNF    Current Diagnoses: Patient Active Problem List   Diagnosis Date Noted  . Severe sepsis ([place]) 09/10/2020  . Acute respiratory failure with hypoxia ([place]) 09/10/2020  . Closed left hip fracture ([place]) 09/02/2020  . Transaminitis 09/02/2020  . AF (paroxysmal atrial fibrillation) ([place]) 09/02/2020  . History of UTI 02/14/2020  . Constipation 02/14/2020  . Iron deficiency anemia 01/17/2020  . Near syncope   . Protein-calorie malnutrition, severe 12/22/2019  . Fall 12/19/2019  . Weight loss 08/30/2019  . Onychomycosis 08/30/2019  . AAA (abdominal aortic aneurysm) ([place]) 08/26/2018  . Kidney stones 06/30/2018  . Allergic rhinitis 04/26/2018  . Urolithiasis 01/05/2018  . Frequent urination 01/01/2018  . Colon polyps 09/15/2017  . GERD (gastroesophageal reflux disease) 09/15/2017  . Wrist pain, acute, right 05/25/2017  . Low back pain 03/24/2017  . Lung nodule seen on imaging study: 7 mm nodule left midlung 03/13/2017  . Physical deconditioning 03/13/2017  . Normocytic anemia 03/13/2017  . Fall at home, initial encounter 03/12/2017  . Abdominal soreness 03/12/2017  . Generalized weakness 03/12/2017  . Leukocytosis 03/12/2017  . Hyponatremia 03/12/2017  . Orthostatic hypotension 02/16/2017  . Anticoagulated 09/19/2016  . Prostate  CA ([place]) 09/04/2016  . Longstanding persistent atrial fibrillation ([place]) 09/04/2016  . Cardiomyopathy, ischemic 07/16/2012  . Chronic kidney disease (CKD), stage III (moderate) ([place]) 07/16/2012  . Essential hypertension 08/05/2011  . Murmur 08/05/2011  . 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 Dyslipidemia 02/04/2011    Orientation RESPIRATION BLADDER Height & Weight     Self,Place  O2 ([place] 4L) Incontinent,External catheter Weight: 72 kg Height:  6\' 3"  (190.5 cm)  BEHAVIORAL SYMPTOMS/MOOD NEUROLOGICAL BOWEL NUTRITION STATUS      Continent Diet (Dysphagia diet 3, nectar thick liquids)  AMBULATORY STATUS COMMUNICATION OF NEEDS Skin   Extensive Assist Verbally Surgical wounds (Left hip incision- skin tears to left elbow and hand)                       Personal Care Assistance Level of Assistance  Bathing,Feeding,Dressing Bathing Assistance: Maximum assistance Feeding assistance: Maximum assistance Dressing Assistance: Maximum assistance     Functional Limitations Info             SPECIAL CARE FACTORS FREQUENCY  PT (By licensed PT),OT (By licensed OT),Speech therapy     PT Frequency: 5 times per week OT Frequency: 5 times per week     Speech Therapy Frequency: 2-3 times per week      Contractures Contractures Info: Not present    Additional Factors Info  Code Status,Allergies Code Status Info: DNR Allergies Info: Ciprofloxacin, ibuprofen           Current Medications (09/14/2020):  This is the current hospital active medication list Current Facility-Administered Medications  Medication Dose Route Frequency Provider Last Rate  Last Admin  . 0.9 %  sodium chloride infusion   Intravenous PRN Allie Bossier, MD 10 mL/hr at 09/13/20 1714 250 mL at 09/13/20 1714  . acetaminophen (TYLENOL) tablet 650 mg  650 mg Oral Q6H PRN Marcelyn Bruins, MD       Or  . acetaminophen (TYLENOL) suppository 650 mg  650 mg Rectal Q6H PRN Marcelyn Bruins, MD      . ceFEPIme  (MAXIPIME) 2 g in sodium chloride 0.9 % 100 mL IVPB  2 g Intravenous Q12H Allie Bossier, MD 200 mL/hr at 09/14/20 1010 2 g at 09/14/20 1010  . dextrose 5 % with KCl 20 mEq / L  infusion  20 mEq Intravenous Continuous Loletha Grayer, MD 75 mL/hr at 09/14/20 0747 20 mEq at 09/14/20 0747  . enoxaparin (LOVENOX) injection 72.5 mg  1 mg/kg Subcutaneous Q12H Giani, Betzold, RPH   72.5 mg at 09/14/20 0000  . metroNIDAZOLE (FLAGYL) IVPB 500 mg  500 mg Intravenous Q8H Allie Bossier, MD 100 mL/hr at 09/14/20 0309 500 mg at 09/14/20 0309  . morphine 2 MG/ML injection 0.5 mg  0.5 mg Intravenous Q4H PRN Fritzi Mandes, MD   0.5 mg at 09/13/20 2041  . sodium chloride flush (NS) 0.9 % injection 3 mL  3 mL Intravenous Q12H Marcelyn Bruins, MD   3 mL at 09/13/20 2043  . tamsulosin (FLOMAX) capsule 0.4 mg  0.4 mg Oral QPC supper Allie Bossier, MD      . traMADol Veatrice Bourbon) tablet 50 mg  50 mg Oral Q6H PRN Marcelyn Bruins, MD   50 mg at 09/11/20 0011     Discharge Medications: Please see discharge summary for a list of discharge medications.  Relevant Imaging Results:  Relevant Lab Results:   Additional Information [id number]  Shelbie Hutching, RN

## 2020-09-14 NOTE — Progress Notes (Signed)
Patient ID: Carl [name], male   DOB: 1936/01/16, 85 y.o.   MRN: [id number] [facility] PROGRESS NOTE  Carl [name] VFI:[id number] DOB: 10-Mar-1936 DOA: 09/10/2020 PCP: [name], MD  HPI/Subjective: Patient alert today and answers questions.  Family at the bedside and they are interested to see how he does with swallowing evaluation.  Patient does have some cough and some gurgling with talking.  Objective: Vitals:   09/14/20 0943 09/14/20 1207  BP: 113/68 107/61  Pulse: 73 72  Resp: 20 20  Temp: 98.5 F (36.9 C) 98.3 F (36.8 C)  SpO2: 96% 100%    Intake/Output Summary (Last 24 hours) at 09/14/2020 1329 Last data filed at 09/14/2020 0631 Gross per 24 hour  Intake 4841.45 ml  Output 1050 ml  Net 3791.45 ml   Filed Weights   09/10/20 1736 09/13/20 0500 09/14/20 0457  Weight: 85 kg 72.3 kg 72 kg    ROS: Review of Systems  Respiratory: Positive for cough and shortness of breath.   Cardiovascular: Negative for chest pain.  Gastrointestinal: Negative for abdominal pain, nausea and vomiting.   Exam: Physical Exam HENT:     Head: Normocephalic.     Mouth/Throat:     Pharynx: No oropharyngeal exudate.  Eyes:     General: Lids are normal.     Conjunctiva/sclera: Conjunctivae normal.     Pupils: Pupils are equal, round, and reactive to light.  Cardiovascular:     Rate and Rhythm: Normal rate and regular rhythm.     Heart sounds: Normal heart sounds, S1 normal and S2 normal.  Pulmonary:     Breath sounds: Transmitted upper airway sounds present. Examination of the right-lower field reveals rhonchi. Examination of the left-lower field reveals rhonchi. Rhonchi present. No decreased breath sounds or wheezing.  Abdominal:     Palpations: Abdomen is soft.     Tenderness: There is no abdominal tenderness.  Musculoskeletal:     Right ankle: No swelling.     Left ankle: No swelling.  Skin:    General: Skin is warm.     Findings: No rash.  Neurological:      Mental Status: He is alert.     Comments: Answers some yes or no questions.       Data Reviewed: Basic Metabolic Panel: Recent Labs  Lab 09/10/20 1737 09/11/20 0602 09/13/20 0517 09/14/20 0543  NA 149* 148* 146* 141  K 5.0 3.3* 3.2* 3.2*  CL 118* 117* 121* 118*  CO2 21* 22 18* 18*  GLUCOSE 124* 109* 130* 113*  BUN 48* 50* 35* 25*  CREATININE 1.47* 1.42* 1.12 1.00  CALCIUM 8.3* 7.8* 7.7* 7.4*  MG  --   --  2.0 1.7  PHOS  --   --  2.1* 1.9*   Liver Function Tests: Recent Labs  Lab 09/10/20 1737 09/11/20 0602 09/13/20 0517 09/14/20 0543  AST 63* 40 28 18  ALT 69* 55* 34 26  ALKPHOS 79 73 64 61  BILITOT 2.5* 1.7* 1.1 1.0  PROT 5.7* 5.1* 4.6* 4.4*  ALBUMIN 2.5* 2.3* 1.9* 1.8*   CBC: Recent Labs  Lab 09/10/20 1740 09/11/20 0602 09/13/20 0517 09/14/20 0543  WBC 15.2* 15.2* 9.2 9.2  NEUTROABS 14.0*  --  8.3* 7.9*  HGB 7.5* 7.6* 7.1* 8.3*  HCT 23.4* 24.0* 22.9* 25.9*  MCV 100.9* 101.3* 102.2* 97.4  PLT 199 215 243 263   BNP (last 3 results) Recent Labs    02/07/20 1458  BNP 112.8*  Recent Results (from the past 240 hour(s))  SARS CORONAVIRUS 2 (TAT 6-24 HRS) Nasopharyngeal Nasopharyngeal Swab     Status: None   Collection Time: 09/06/20  3:45 PM   Specimen: Nasopharyngeal Swab  Result Value Ref Range Status   SARS Coronavirus 2 NEGATIVE NEGATIVE Final    Comment: (NOTE) SARS-CoV-2 target nucleic acids are NOT DETECTED.  The SARS-CoV-2 RNA is generally detectable in upper and lower respiratory specimens during the acute phase of infection. Negative results do not preclude SARS-CoV-2 infection, do not rule out co-infections with other pathogens, and should not be used as the sole basis for treatment or other patient management decisions. Negative results must be combined with clinical observations, patient history, and epidemiological information. The expected result is Negative.  Fact Sheet for  Patients: SugarRoll.be  Fact Sheet for Healthcare Providers: https://www.woods-mathews.com/  This test is not yet approved or cleared by the [place] FDA and  has been authorized for detection and/or diagnosis of SARS-CoV-2 by FDA under an Emergency Use Authorization (EUA). This EUA will remain  in effect (meaning this test can be used) for the duration of the COVID-19 declaration under Se ction 564(b)(1) of the Act, 21 U.S.C. section 360bbb-3(b)(1), unless the authorization is terminated or revoked sooner.  Performed at [facility], [place] [address] [place], [place] [address]   Blood Culture (routine x 2)     Status: None (Preliminary result)   Collection Time: 09/10/20  5:37 PM   Specimen: BLOOD  Result Value Ref Range Status   Specimen Description BLOOD LEFT ANTECUBITAL  Final   Special Requests   Final    BOTTLES DRAWN AEROBIC AND ANAEROBIC Blood Culture adequate volume   Culture   Final    NO GROWTH 4 DAYS Performed at [facility], [address] [place], [place] [address]    Report Status PENDING  Incomplete  Resp Panel by RT-PCR (Flu A&B, Covid) Nasopharyngeal Swab     Status: None   Collection Time: 09/10/20  5:39 PM   Specimen: Nasopharyngeal Swab; Nasopharyngeal(NP) swabs in vial transport medium  Result Value Ref Range Status   SARS Coronavirus 2 by RT PCR NEGATIVE NEGATIVE Final    Comment: (NOTE) SARS-CoV-2 target nucleic acids are NOT DETECTED.  The SARS-CoV-2 RNA is generally detectable in upper respiratory specimens during the acute phase of infection. The lowest concentration of SARS-CoV-2 viral copies this assay can detect is 138 copies/mL. A negative result does not preclude SARS-Cov-2 infection and should not be used as the sole basis for treatment or other patient management decisions. A negative result may occur with  improper specimen collection/handling, submission of specimen other than  nasopharyngeal swab, presence of viral mutation(s) within the areas targeted by this assay, and inadequate number of viral copies(<138 copies/mL). A negative result must be combined with clinical observations, patient history, and epidemiological information. The expected result is Negative.  Fact Sheet for Patients:  EntrepreneurPulse.com.au  Fact Sheet for Healthcare Providers:  IncredibleEmployment.be  This test is no t yet approved or cleared by the [place] FDA and  has been authorized for detection and/or diagnosis of SARS-CoV-2 by FDA under an Emergency Use Authorization (EUA). This EUA will remain  in effect (meaning this test can be used) for the duration of the COVID-19 declaration under Section 564(b)(1) of the Act, 21 U.S.C.section 360bbb-3(b)(1), unless the authorization is terminated  or revoked sooner.       Influenza A by PCR NEGATIVE NEGATIVE Final  Influenza B by PCR NEGATIVE NEGATIVE Final    Comment: (NOTE) The Xpert Xpress SARS-CoV-2/FLU/RSV plus assay is intended as an aid in the diagnosis of influenza from Nasopharyngeal swab specimens and should not be used as a sole basis for treatment. Nasal washings and aspirates are unacceptable for Xpert Xpress SARS-CoV-2/FLU/RSV testing.  Fact Sheet for Patients: EntrepreneurPulse.com.au  Fact Sheet for Healthcare Providers: IncredibleEmployment.be  This test is not yet approved or cleared by the [place] FDA and has been authorized for detection and/or diagnosis of SARS-CoV-2 by FDA under an Emergency Use Authorization (EUA). This EUA will remain in effect (meaning this test can be used) for the duration of the COVID-19 declaration under Section 564(b)(1) of the Act, 21 U.S.C. section 360bbb-3(b)(1), unless the authorization is terminated or revoked.  Performed at [facility], [address] [place], [place]  [address]   Blood Culture (routine x 2)     Status: None (Preliminary result)   Collection Time: 09/10/20  5:42 PM   Specimen: BLOOD  Result Value Ref Range Status   Specimen Description BLOOD RIGHT ANTECUBITAL  Final   Special Requests   Final    BOTTLES DRAWN AEROBIC AND ANAEROBIC Blood Culture adequate volume   Culture   Final    NO GROWTH 4 DAYS Performed at [facility], [address], [place], [place] [address]    Report Status PENDING  Incomplete  Urine culture     Status: Abnormal   Collection Time: 09/10/20 11:34 PM   Specimen: Urine, Random  Result Value Ref Range Status   Specimen Description   Final    URINE, RANDOM Performed at [facility], [address] [place], [place] [address]    Special Requests   Final    NONE Performed at [facility], [address] [place], [place] [address]    Culture >=100,000 COLONIES/mL PSEUDOMONAS AERUGINOSA (A)  Final   Report Status 09/13/2020 FINAL  Final   Organism ID, Bacteria PSEUDOMONAS AERUGINOSA (A)  Final      Susceptibility   Pseudomonas aeruginosa - MIC*    CEFTAZIDIME 4 SENSITIVE Sensitive     CIPROFLOXACIN <=0.25 SENSITIVE Sensitive     GENTAMICIN 2 SENSITIVE Sensitive     IMIPENEM 2 SENSITIVE Sensitive     PIP/TAZO 8 SENSITIVE Sensitive     CEFEPIME 4 SENSITIVE Sensitive     * >=100,000 COLONIES/mL PSEUDOMONAS AERUGINOSA      Scheduled Meds: . enoxaparin (LOVENOX) injection  1 mg/kg Subcutaneous Q12H  . sodium chloride flush  3 mL Intravenous Q12H  . tamsulosin  0.4 mg Oral QPC supper   Continuous Infusions: . sodium chloride 250 mL (09/13/20 1714)  . ceFEPime (MAXIPIME) IV 2 g (09/14/20 1010)  . dextrose 5 % with KCl 20 mEq / L    . metronidazole 500 mg (09/14/20 1119)  . potassium PHOSPHATE IVPB (in mmol)      Assessment/Plan:  1. Severe sepsis, present on admission with acute hypoxic respiratory failure, bilateral aspiration pneumonia, Pseudomonas UTI.  Patient had  leukocytosis fever tachypnea and elevated lactic acid to 3.3.  Patient currently on Maxipime and Flagyl.  This morning was on 5 L of oxygen.  Continue to taper down. 2. Bilateral aspiration pneumonia.  Patient did better today with speech therapy.  Able to be placed on a diet.  Need further swallow evaluation will be done on Monday.  Continue Maxipime and Flagyl. 3. Pseudomonas UTI cefepime will cover 4. Dysphagia.  Was n.p.o.  up until this point.  High risk for aspiration.  Did better today with speech therapy and placed on a diet.  Swallow evaluation Monday. 5. Iron deficiency anemia was given 1 unit of packed red blood cells on 09/13/2020.  Hemoglobin up to 8.3 today. 6. Recent left hip fracture.  Nursing staff let me know that there was a lot of serous drainage from the hip.  I let orthopedic specialist Dr. Sabra Heck know. 7. Hypernatremia and hypokalemia.  On D5W plus KCl.  Sodium has improved into the normal range.  Replace potassium IV. 8. Hypophosphatemia replace phosphate IV. 9. Hypomagnesemia replace magnesium IV. 10. Atrial fibrillation.  Eliquis on hold secondary to swallowing issues and on Lovenox injections hopefully if does well with diet today can switch back to Eliquis tomorrow. 11. History of hypertension and CABG 12. Hyperlipidemia unspecified.  Crestor on hold 13. History of prostate cancer. 14. Weakness.  Will end up needing rehab again.   Code Status:     Code Status Orders  (From admission, onward)         Start     Ordered   09/10/20 1927  Do not attempt resuscitation (DNR)  Continuous       Question Answer Comment  In the event of cardiac or respiratory ARREST Do not call a "code blue"   In the event of cardiac or respiratory ARREST Do not perform Intubation, CPR, defibrillation or ACLS   In the event of cardiac or respiratory ARREST Use medication by any route, position, wound care, and other measures to relive pain and suffering. May use oxygen, suction and manual  treatment of airway obstruction as needed for comfort.   Comments CODE STATUS was discussed and patient wishes to be placed on a DO NOT RESUSCITATE      09/10/20 1929        Code Status History    Date Active Date Inactive Code Status Order ID Comments User Context   09/02/2020 1018 09/07/2020 2011 DNR [id number]  Collier Bullock, MD ED   09/02/2020 0935 09/02/2020 1017 Full Code [id number]  Collier Bullock, MD ED   12/20/2019 1731 12/23/2019 1827 DNR [id number]  Nita Sells, MD Inpatient   12/19/2019 1343 12/20/2019 1731 Full Code [id number]  Lequita Halt, MD ED   03/08/2019 1132 03/10/2019 1627 Full Code [id number]  Lang Snow, NP ED   03/02/2019 2157 03/04/2019 1836 Full Code [id number]  Jani Gravel, MD ED   10/05/2018 1758 10/12/2018 0206 Full Code [id number]  [place], Fernando Salinas, DO ED   08/05/2018 0007 08/09/2018 1918 Full Code [id number]  Etta Quill, DO ED   01/05/2018 0309 01/06/2018 2143 Full Code [id number]  Rise Patience, MD ED   03/12/2017 1628 03/17/2017 2043 Full Code [id number]  Murlean Iba, MD Inpatient   Advance Care Planning Activity     Family Communication: Family at bedside Disposition Plan: Status is: Inpatient  Dispo: The patient is from: Rehab              Anticipated d/c is to: Rehab              Anticipated d/c date is: Likely 4 days in the hospital              Patient currently just put on a diet today and will need another swallow evaluation on Monday   Difficult to place patient.  Hopefully not  Antibiotics:  Cefepime  Flagyl  Time spent: 29 minutes, case discussed with speech therapy,  nursing staff and orthopedic surgery  [place]

## 2020-09-14 NOTE — Progress Notes (Signed)
Subjective:    Patient is getting a little stronger each day according to his son.  He did better with the oral intake today.  A repeat swallowing test was better he says.  Patient is not complain of any significant pain.  He does have some serous drainage from the incision which is being taken care of with dressing changes.  Patient reports pain as mild.  Objective:   VITALS:   Vitals:   09/14/20 0943 09/14/20 1207  BP: 113/68 107/61  Pulse: 73 72  Resp: 20 20  Temp: 98.5 F (36.9 C) 98.3 F (36.8 C)  SpO2: 96% 100%    Neurologically intact Dorsiflexion/Plantar flexion intact Incision: moderate drainage  LABS Recent Labs    09/13/20 0517 09/14/20 0543  HGB 7.1* 8.3*  HCT 22.9* 25.9*  WBC 9.2 9.2  PLT 243 263    Recent Labs    09/13/20 0517 09/14/20 0543  NA 146* 141  K 3.2* 3.2*  BUN 35* 25*  CREATININE 1.12 1.00  GLUCOSE 130* 113*    No results for input(s): LABPT, INR in the last 72 hours.   Assessment/Plan:      Advance diet Up with therapy Discharge to SNF  Needs protein buildup to help heal his wounds if possible. Continue dressing changes as needed.

## 2020-09-14 NOTE — Progress Notes (Signed)
Physical Therapy Treatment Patient Details Name: Carl [name] MRN: [id number] DOB: 1935-08-18 Today's Date: 09/14/2020    History of Present Illness Pt is an 85 y/o M with PMH: pAFib on Eliquis, dCHF, AAA, stage III CKD, CAD and recent adm to [facility] with fall to L side and was s/p L hemiarthroplasty with Dr. Sabra Heck. Pt re-presented from [facility] where he went after hip sx, d/t SOB. Pt found to have b/l aspiration PNA on imaging and adm with sepsis.    PT Comments    Pt resting in bed upon PT arrival; family member present.  Pt requiring encouragement from pt's son to participate in therapy session.  Max assist with bed mobility.  Initially mod assist for sitting balance d/t R lean (d/t L hip/thigh pain) but progressed to close SBA (although pt tending to lean towards R side still d/t L hip/thigh pain); focused on upright posture/positioning in sitting and LE ex's in sitting.  Pt reporting 7/10 L hip/thigh pain end of session and requesting pain meds: nurse present and aware.  Will continue to focus on strengthening and progressive functional mobility per pt tolerance.    Follow Up Recommendations  SNF     Equipment Recommendations   (TBD at next level of care)    Recommendations for Other Services OT consult     Precautions / Restrictions Precautions Precautions: Posterior Hip;Fall Precaution Booklet Issued: No Precaution Comments: hip abduction pillow; aspiration Restrictions Weight Bearing Restrictions: Yes LLE Weight Bearing: Partial weight bearing    Mobility  Bed Mobility Overal bed mobility: Needs Assistance Bed Mobility: Supine to Sit;Sit to Supine Rolling: Max assist   Supine to sit: Max assist;HOB elevated Sit to supine: Max assist;HOB elevated   General bed mobility comments: vc's for technique; assist for trunk and B LE's    Transfers                 General transfer comment: deferred d/t pt with difficulty sitting d/t L hip/thigh pain (pt  leaning to R side)  Ambulation/Gait             General Gait Details: not appropriate at this time   Stairs             Wheelchair Mobility    Modified Rankin (Stroke Patients Only)       Balance Overall balance assessment: Needs assistance Sitting-balance support: Single extremity supported;Feet supported Sitting balance-Leahy Scale: Fair Sitting balance - Comments: pt leaning onto R elbow d/t L hip/thigh pain Postural control: Right lateral lean                                  Cognition Arousal/Alertness:  (Drowsy but wakes to vc's) Behavior During Therapy: Flat affect Overall Cognitive Status: Impaired/Different from baseline (oriented to person and date) Area of Impairment: Orientation;Memory;Following commands;Problem solving                 Orientation Level: Disoriented to;Situation Current Attention Level: Alternating Memory: Decreased recall of precautions Following Commands: Follows one step commands consistently;Follows one step commands with increased time Safety/Judgement: Decreased awareness of safety;Decreased awareness of deficits   Problem Solving: Slow processing;Requires verbal cues;Requires tactile cues General Comments: Requires vc's for posterior THP's      Exercises General Exercises - Lower Extremity Long Arc Quad: AAROM;Left;AROM;Right;Strengthening;10 reps;Seated Hip Flexion/Marching: AROM;Right;AAROM;Left;Strengthening;10 reps;Seated    General Comments General comments (skin integrity, edema, etc.): no drainage noted L  hip/thigh dressings (pt's son reports dressings recently changed).       Pertinent Vitals/Pain Pain Assessment: 0-10 Pain Score: 7  Faces Pain Scale: Hurts little more Pain Location: L hip/thigh Pain Descriptors / Indicators: Grimacing;Operative site guarding;Tender;Sore Pain Intervention(s): Limited activity within patient's tolerance;Monitored during session;Repositioned;Patient  requesting pain meds-RN notified  Vitals (HR and O2 on supplemental O2) stable and WFL throughout treatment session.    Home Living               Prior Function    PT Goals (current goals can now be found in the care plan section) Acute Rehab PT Goals Patient Stated Goal: to improve mobility PT Goal Formulation: With patient Time For Goal Achievement: 09/18/20 Potential to Achieve Goals: Fair Progress towards PT goals: Progressing toward goals    Frequency    7X/week      PT Plan Current plan remains appropriate    Co-evaluation              AM-PAC PT "6 Clicks" Mobility   Outcome Measure  Help needed turning from your back to your side while in a flat bed without using bedrails?: A Lot Help needed moving from lying on your back to sitting on the side of a flat bed without using bedrails?: A Lot Help needed moving to and from a bed to a chair (including a wheelchair)?: Total Help needed standing up from a chair using your arms (e.g., wheelchair or bedside chair)?: Total Help needed to walk in hospital room?: Total Help needed climbing 3-5 steps with a railing? : Total 6 Click Score: 8    End of Session Equipment Utilized During Treatment: Oxygen Activity Tolerance: Patient limited by pain Patient left: in bed;with call bell/phone within reach;with bed alarm set;with family/visitor present;Other (comment) (hip abduction pillow in place; B heels floating via towel rolls) Nurse Communication: Mobility status;Precautions;Patient requests pain meds PT Visit Diagnosis: Muscle weakness (generalized) (M62.81);Other abnormalities of gait and mobility (R26.89);History of falling (Z91.81);Pain Pain - Right/Left: Left Pain - part of body: Hip     Time: 0938-1829 PT Time Calculation (min) (ACUTE ONLY): 41 min  Charges:  $Therapeutic Exercise: 23-37 mins $Therapeutic Activity: 8-22 mins                    Leitha Bleak, PT 09/14/20, 4:47 PM

## 2020-09-15 DIAGNOSIS — N390 Urinary tract infection, site not specified: Secondary | ICD-10-CM

## 2020-09-15 DIAGNOSIS — J9601 Acute respiratory failure with hypoxia: Secondary | ICD-10-CM | POA: Diagnosis not present

## 2020-09-15 DIAGNOSIS — A419 Sepsis, unspecified organism: Secondary | ICD-10-CM | POA: Diagnosis not present

## 2020-09-15 DIAGNOSIS — J690 Pneumonitis due to inhalation of food and vomit: Secondary | ICD-10-CM | POA: Diagnosis not present

## 2020-09-15 LAB — CBC WITH DIFFERENTIAL/PLATELET
Abs Immature Granulocytes: 0.23 10*3/uL — ABNORMAL HIGH (ref 0.00–0.07)
Basophils Absolute: 0 10*3/uL (ref 0.0–0.1)
Basophils Relative: 0 %
Eosinophils Absolute: 0.1 10*3/uL (ref 0.0–0.5)
Eosinophils Relative: 1 %
HCT: 29.1 % — ABNORMAL LOW (ref 39.0–52.0)
Hemoglobin: 9.1 g/dL — ABNORMAL LOW (ref 13.0–17.0)
Immature Granulocytes: 2 %
Lymphocytes Relative: 7 %
Lymphs Abs: 0.8 10*3/uL (ref 0.7–4.0)
MCH: 30.8 pg (ref 26.0–34.0)
MCHC: 31.3 g/dL (ref 30.0–36.0)
MCV: 98.6 fL (ref 80.0–100.0)
Monocytes Absolute: 0.5 10*3/uL (ref 0.1–1.0)
Monocytes Relative: 5 %
Neutro Abs: 9.1 10*3/uL — ABNORMAL HIGH (ref 1.7–7.7)
Neutrophils Relative %: 85 %
Platelets: 308 10*3/uL (ref 150–400)
RBC: 2.95 MIL/uL — ABNORMAL LOW (ref 4.22–5.81)
RDW: 19 % — ABNORMAL HIGH (ref 11.5–15.5)
WBC: 10.8 10*3/uL — ABNORMAL HIGH (ref 4.0–10.5)
nRBC: 0 % (ref 0.0–0.2)

## 2020-09-15 LAB — CULTURE, BLOOD (ROUTINE X 2)
Culture: NO GROWTH
Culture: NO GROWTH
Special Requests: ADEQUATE
Special Requests: ADEQUATE

## 2020-09-15 LAB — COMPREHENSIVE METABOLIC PANEL
ALT: 22 U/L (ref 0–44)
AST: 16 U/L (ref 15–41)
Albumin: 1.9 g/dL — ABNORMAL LOW (ref 3.5–5.0)
Alkaline Phosphatase: 62 U/L (ref 38–126)
Anion gap: 4 — ABNORMAL LOW (ref 5–15)
BUN: 20 mg/dL (ref 8–23)
CO2: 21 mmol/L — ABNORMAL LOW (ref 22–32)
Calcium: 7.3 mg/dL — ABNORMAL LOW (ref 8.9–10.3)
Chloride: 114 mmol/L — ABNORMAL HIGH (ref 98–111)
Creatinine, Ser: 0.88 mg/dL (ref 0.61–1.24)
GFR, Estimated: >60 mL/min (ref >60)
Glucose, Bld: 101 mg/dL — ABNORMAL HIGH (ref 70–99)
Potassium: 4 mmol/L (ref 3.5–5.1)
Sodium: 139 mmol/L (ref 135–145)
Total Bilirubin: 0.9 mg/dL (ref 0.3–1.2)
Total Protein: 4.7 g/dL — ABNORMAL LOW (ref 6.5–8.1)

## 2020-09-15 LAB — PHOSPHORUS: Phosphorus: 2.7 mg/dL (ref 2.5–4.6)

## 2020-09-15 LAB — MAGNESIUM: Magnesium: 1.8 mg/dL (ref 1.7–2.4)

## 2020-09-15 MED ORDER — SODIUM CHLORIDE 0.9 % IV SOLN
2.0000 g | Freq: Three times a day (TID) | INTRAVENOUS | Status: DC
  Administered 2020-09-15 – 2020-09-18 (×10): 2 g via INTRAVENOUS
  Filled 2020-09-15 (×13): qty 2

## 2020-09-15 MED ORDER — MAGNESIUM SULFATE 2 GM/50ML IV SOLN
2.0000 g | Freq: Once | INTRAVENOUS | Status: AC
  Administered 2020-09-15: 10:00:00 2 g via INTRAVENOUS
  Filled 2020-09-15: qty 50

## 2020-09-15 MED ORDER — APIXABAN 5 MG PO TABS
5.0000 mg | ORAL_TABLET | Freq: Two times a day (BID) | ORAL | Status: DC
  Administered 2020-09-15 – 2020-09-18 (×7): 5 mg via ORAL
  Filled 2020-09-15 (×7): qty 1

## 2020-09-15 NOTE — Consult Note (Addendum)
Pharmacy Antibiotic Note  Carl [name] is a 85 y.o. male admitted on 09/10/2020 with sepsis.  Urine culture positive for Pseudomonas. Pharmacy has been consulted for Cefepime dosing. Day 5 of Antibiotics.  Plan: Will adjust Cefepime 2g IV Q 12 hours to Cefepime 2g q8h per improved renal function (CrCL > 60 ml/min)   Will adjust dose per renal function   Height: 6\' 3"  (190.5 cm) Weight: 72 kg (158 lb 11.7 oz) IBW/kg (Calculated) : 84.5  Temp (24hrs), Avg:98.3 F (36.8 C), Min:97.6 F (36.4 C), Max:98.9 F (37.2 C)  Recent Labs  Lab 09/10/20 1737 09/10/20 1740 09/10/20 2210 09/11/20 0602 09/13/20 0517 09/14/20 0543 09/15/20 0404  WBC  --  15.2*  --  15.2* 9.2 9.2 10.8*  CREATININE 1.47*  --   --  1.42* 1.12 1.00 0.88  LATICACIDVEN 3.3*  --  1.3  --  1.0  --   --     Estimated Creatinine Clearance: 65.9 mL/min (by C-G formula based on SCr of 0.88 mg/dL).    Allergies  Allergen Reactions  . Ciprofloxacin Other (See Comments)    PER HEART DR  . Ibuprofen Other (See Comments)    Kidney issues    Antimicrobials this admission: Cefepime/Metronidazole 2/14 >> (planned stop 2/20 for 7 day total)   Microbiology results: 2/14 BCx: NG x 4 days 2/14 UCx: Pseudomonas  Thank you for allowing pharmacy to be a part of this patient's care.  Lu Duffel, PharmD, BCPS Clinical Pharmacist 09/15/2020 8:34 AM

## 2020-09-15 NOTE — Progress Notes (Signed)
Physical Therapy Treatment Patient Details Name: Carl [name] MRN: [id number] DOB: [birth year] Today's Date: 09/15/2020    History of Present Illness Pt is an 85 y/o M with PMH: pAFib on Eliquis, dCHF, AAA, stage III CKD, CAD and recent adm to [facility] with fall to L side and was s/p L hemiarthroplasty with Dr. Sabra Heck. Pt re-presented from [facility] where he went after hip sx, d/t SOB. Pt found to have b/l aspiration PNA on imaging and adm with sepsis.    PT Comments    Pt was long sitting in bed wide awake upon arriving. Cognition is much improved from previous session with author 1 week prior. He is A and O x 3. Did correctly state reason for being in hospital however does not understand full scope of limitations. Reviewed Hip precautions and PWB restriction. He will need reinforcement in future sessions. He was on 5 L o2 throughout session with sao2 maintaining > 90%. Max assist to progress to EOB and return after performing EOB activity. Did attempt standing 1 x from elevated bed height however pt is unable to maintain proper wt bearing and was quickly returned to supine via max assist. Total assist to reposition to [facility] after EOB activity. Pt did perform AROM?AAROM exercises in bed but requires a lot of time and cueing. Pt has productive cough 3 x during session with ability to clear secretions and spit it up in a cup. Overall pt is progressing but will need extensive PT moving forward. Highly recommend SNF at [place] to address these deficits while assisting pt to PLOF. RN tech in room at conclusion of session.    Follow Up Recommendations  SNF     Equipment Recommendations  Other (comment) (defer to next level of care)    Recommendations for Other Services       Precautions / Restrictions Precautions Precautions: Posterior Hip;Fall Precaution Booklet Issued: No Precaution Comments: hip abduction pillow; aspiration Restrictions Weight Bearing Restrictions: Yes LLE Weight Bearing:  Partial weight bearing    Mobility  Bed Mobility Overal bed mobility: Needs Assistance Bed Mobility: Supine to Sit;Sit to Supine Rolling: Max assist   Supine to sit: Max assist Sit to supine: Max assist   General bed mobility comments: Pt required max assist + increased time to progress from longsitting to short sit. severe R lateral lean at first but with cues and time is able to achieve SBA to maintain sitting balance at EOB. is able to actively progress BLEs towards EOB    Transfers Overall transfer level: Needs assistance Equipment used: Rolling walker (2 wheeled) Transfers: Sit to/from Stand           General transfer comment: Pt attempted standing one time from elevated bed height + max vcs/assistance. He is unable to maintain proper wt bearing so was returned to sitting and repositioned in bed to perform ther ex.  Ambulation/Gait      General Gait Details: unable/unsafe at this time      Balance Overall balance assessment: Needs assistance Sitting-balance support: Single extremity supported;Feet supported Sitting balance-Leahy Scale: Fair Sitting balance - Comments: R lateral lean throughout sitting but with cues is able to correct. close SBA throughout sitting at EOB   Standing balance support: Bilateral upper extremity supported;During functional activity Standing balance-Leahy Scale: Poor Standing balance comment: unable to safely stand without max assist. limited by inability to maintain proper PWB status         Cognition Arousal/Alertness: Awake/alert Behavior During Therapy: [facility] for tasks  assessed/performed Overall Cognitive Status: Impaired/Different from baseline Area of Impairment: Orientation;Memory;Following commands;Problem solving        Orientation Level: Disoriented to;Time     Following Commands: Follows one step commands consistently;Follows one step commands with increased time Safety/Judgement: Decreased awareness of safety;Decreased  awareness of deficits   Problem Solving: Slow processing;Decreased initiation;Difficulty sequencing;Requires verbal cues;Requires tactile cues General Comments: Pt is alert throughout session. Did not know correct time of day but was oriented to self, situation and location. pt much more cognitively sharp than previous session with author one week prior.      Exercises Total Joint Exercises Ankle Circles/Pumps: AROM;10 reps Quad Sets: AROM;10 reps Gluteal Sets: AROM;5 reps Heel Slides: AROM;5 reps Hip ABduction/ADduction: AAROM;5 reps Straight Leg Raises: AAROM;5 reps Long Arc Quad: 5 reps (while seated EOB)    General Comments        Pertinent Vitals/Pain Pain Assessment: 0-10 Pain Score: 3  Faces Pain Scale: Hurts a little bit Pain Location: L hip/thigh Pain Descriptors / Indicators: Grimacing;Operative site guarding;Tender;Sore Pain Intervention(s): Limited activity within patient's tolerance;Monitored during session;Premedicated before session;Repositioned           PT Goals (current goals can now be found in the care plan section) Acute Rehab PT Goals Patient Stated Goal: get stronger Progress towards PT goals: Progressing toward goals    Frequency    7X/week      PT Plan Current plan remains appropriate       AM-PAC PT "6 Clicks" Mobility   Outcome Measure  Help needed turning from your back to your side while in a flat bed without using bedrails?: A Lot Help needed moving from lying on your back to sitting on the side of a flat bed without using bedrails?: A Lot Help needed moving to and from a bed to a chair (including a wheelchair)?: A Lot Help needed standing up from a chair using your arms (e.g., wheelchair or bedside chair)?: Total Help needed to walk in hospital room?: Total Help needed climbing 3-5 steps with a railing? : Total 6 Click Score: 9    End of Session Equipment Utilized During Treatment: Oxygen (5 L o2 throughout session) Activity  Tolerance: Patient tolerated treatment well Patient left: in bed;with call bell/phone within reach;with bed alarm set;with family/visitor present;Other (comment) (pt unwilling to sit in recliner) Nurse Communication: Mobility status;Precautions PT Visit Diagnosis: Muscle weakness (generalized) (M62.81);Other abnormalities of gait and mobility (R26.89);History of falling (Z91.81);Pain Pain - Right/Left: Left Pain - part of body: Hip     Time: 0923-3007 PT Time Calculation (min) (ACUTE ONLY): 28 min  Charges:  $Therapeutic Exercise: 8-22 mins $Therapeutic Activity: 8-22 mins                     Julaine Fusi PTA 09/15/20, 8:41 AM

## 2020-09-15 NOTE — Progress Notes (Signed)
Patient ID: Carl [name], male   DOB: 1936-02-11, 85 y.o.   MRN: [id number] [facility] PROGRESS NOTE  Carl [name] CHY:[id number] DOB: Jun 28, 1936 DOA: 09/10/2020 PCP: [name], MD  HPI/Subjective: Patient today felt a little bit better.  Still on 5 L of oxygen.  Still has some cough.  Was able to cough up some phlegm today.  With physical therapy with moving around little bit he was coughing up some phlegm.  Patient was able to swallow and placed on a diet yesterday by speech therapy.  Initially admitted with severe sepsis  Objective: Vitals:   09/15/20 0830 09/15/20 1208  BP: (!) 159/73 (!) 146/61  Pulse: 74 68  Resp: 18 20  Temp: 98 F (36.7 C) 98.5 F (36.9 C)  SpO2: 98% 100%    Intake/Output Summary (Last 24 hours) at 09/15/2020 1413 Last data filed at 09/14/2020 1747 Gross per 24 hour  Intake 849.21 ml  Output --  Net 849.21 ml   Filed Weights   09/13/20 0500 09/14/20 0457 09/15/20 0458  Weight: 72.3 kg 72 kg 72 kg    ROS: Review of Systems  Respiratory: Positive for cough and shortness of breath.   Cardiovascular: Negative for chest pain.  Gastrointestinal: Negative for abdominal pain, nausea and vomiting.   Exam: Physical Exam HENT:     Mouth/Throat:     Pharynx: No oropharyngeal exudate.     Comments: Some crusting on the tongue. Eyes:     General: Lids are normal.     Conjunctiva/sclera: Conjunctivae normal.  Cardiovascular:     Rate and Rhythm: Normal rate and regular rhythm.     Heart sounds: Normal heart sounds, S1 normal and S2 normal.  Pulmonary:     Breath sounds: Examination of the right-lower field reveals decreased breath sounds and rhonchi. Examination of the left-lower field reveals decreased breath sounds and rhonchi. Decreased breath sounds and rhonchi present. No wheezing or rales.  Abdominal:     Palpations: Abdomen is soft.     Tenderness: There is no abdominal tenderness.  Musculoskeletal:     Right lower leg: No  swelling.     Left lower leg: No swelling.  Skin:    General: Skin is warm.     Findings: No rash.  Neurological:     Mental Status: He is alert.     Comments: Answers questions appropriately and able to communicate more today than yesterday.       Data Reviewed: Basic Metabolic Panel: Recent Labs  Lab 09/10/20 1737 09/11/20 0602 09/13/20 0517 09/14/20 0543 09/15/20 0404  NA 149* 148* 146* 141 139  K 5.0 3.3* 3.2* 3.2* 4.0  CL 118* 117* 121* 118* 114*  CO2 21* 22 18* 18* 21*  GLUCOSE 124* 109* 130* 113* 101*  BUN 48* 50* 35* 25* 20  CREATININE 1.47* 1.42* 1.12 1.00 0.88  CALCIUM 8.3* 7.8* 7.7* 7.4* 7.3*  MG  --   --  2.0 1.7 1.8  PHOS  --   --  2.1* 1.9* 2.7   Liver Function Tests: Recent Labs  Lab 09/10/20 1737 09/11/20 0602 09/13/20 0517 09/14/20 0543 09/15/20 0404  AST 63* 40 28 18 16   ALT 69* 55* 34 26 22  ALKPHOS 79 73 64 61 62  BILITOT 2.5* 1.7* 1.1 1.0 0.9  PROT 5.7* 5.1* 4.6* 4.4* 4.7*  ALBUMIN 2.5* 2.3* 1.9* 1.8* 1.9*   CBC: Recent Labs  Lab 09/10/20 1740 09/11/20 0602 09/13/20 0517 09/14/20 0543 09/15/20 0404  WBC 15.2* 15.2* 9.2 9.2 10.8*  NEUTROABS 14.0*  --  8.3* 7.9* 9.1*  HGB 7.5* 7.6* 7.1* 8.3* 9.1*  HCT 23.4* 24.0* 22.9* 25.9* 29.1*  MCV 100.9* 101.3* 102.2* 97.4 98.6  PLT 199 215 243 263 308   BNP (last 3 results) Recent Labs    02/07/20 1458  BNP 112.8*      Recent Results (from the past 240 hour(s))  SARS CORONAVIRUS 2 (TAT 6-24 HRS) Nasopharyngeal Nasopharyngeal Swab     Status: None   Collection Time: 09/06/20  3:45 PM   Specimen: Nasopharyngeal Swab  Result Value Ref Range Status   SARS Coronavirus 2 NEGATIVE NEGATIVE Final    Comment: (NOTE) SARS-CoV-2 target nucleic acids are NOT DETECTED.  The SARS-CoV-2 RNA is generally detectable in upper and lower respiratory specimens during the acute phase of infection. Negative results do not preclude SARS-CoV-2 infection, do not rule out co-infections with other  pathogens, and should not be used as the sole basis for treatment or other patient management decisions. Negative results must be combined with clinical observations, patient history, and epidemiological information. The expected result is Negative.  Fact Sheet for Patients: SugarRoll.be  Fact Sheet for Healthcare Providers: https://www.woods-mathews.com/  This test is not yet approved or cleared by the [place] FDA and  has been authorized for detection and/or diagnosis of SARS-CoV-2 by FDA under an Emergency Use Authorization (EUA). This EUA will remain  in effect (meaning this test can be used) for the duration of the COVID-19 declaration under Se ction 564(b)(1) of the Act, 21 U.S.C. section 360bbb-3(b)(1), unless the authorization is terminated or revoked sooner.  Performed at [facility], [place] [address] [place], [place] [address]   Blood Culture (routine x 2)     Status: None   Collection Time: 09/10/20  5:37 PM   Specimen: BLOOD  Result Value Ref Range Status   Specimen Description BLOOD LEFT ANTECUBITAL  Final   Special Requests   Final    BOTTLES DRAWN AEROBIC AND ANAEROBIC Blood Culture adequate volume   Culture   Final    NO GROWTH 5 DAYS Performed at [facility], [address] [place], [place] [address]    Report Status 09/15/2020 FINAL  Final  Resp Panel by RT-PCR (Flu A&B, Covid) Nasopharyngeal Swab     Status: None   Collection Time: 09/10/20  5:39 PM   Specimen: Nasopharyngeal Swab; Nasopharyngeal(NP) swabs in vial transport medium  Result Value Ref Range Status   SARS Coronavirus 2 by RT PCR NEGATIVE NEGATIVE Final    Comment: (NOTE) SARS-CoV-2 target nucleic acids are NOT DETECTED.  The SARS-CoV-2 RNA is generally detectable in upper respiratory specimens during the acute phase of infection. The lowest concentration of SARS-CoV-2 viral copies this assay can detect is 138 copies/mL. A  negative result does not preclude SARS-Cov-2 infection and should not be used as the sole basis for treatment or other patient management decisions. A negative result may occur with  improper specimen collection/handling, submission of specimen other than nasopharyngeal swab, presence of viral mutation(s) within the areas targeted by this assay, and inadequate number of viral copies(<138 copies/mL). A negative result must be combined with clinical observations, patient history, and epidemiological information. The expected result is Negative.  Fact Sheet for Patients:  EntrepreneurPulse.com.au  Fact Sheet for Healthcare Providers:  IncredibleEmployment.be  This test is no t yet approved or cleared by the [place] FDA and  has been authorized for detection and/or diagnosis  of SARS-CoV-2 by FDA under an Emergency Use Authorization (EUA). This EUA will remain  in effect (meaning this test can be used) for the duration of the COVID-19 declaration under Section 564(b)(1) of the Act, 21 U.S.C.section 360bbb-3(b)(1), unless the authorization is terminated  or revoked sooner.       Influenza A by PCR NEGATIVE NEGATIVE Final   Influenza B by PCR NEGATIVE NEGATIVE Final    Comment: (NOTE) The Xpert Xpress SARS-CoV-2/FLU/RSV plus assay is intended as an aid in the diagnosis of influenza from Nasopharyngeal swab specimens and should not be used as a sole basis for treatment. Nasal washings and aspirates are unacceptable for Xpert Xpress SARS-CoV-2/FLU/RSV testing.  Fact Sheet for Patients: EntrepreneurPulse.com.au  Fact Sheet for Healthcare Providers: IncredibleEmployment.be  This test is not yet approved or cleared by the [place] FDA and has been authorized for detection and/or diagnosis of SARS-CoV-2 by FDA under an Emergency Use Authorization (EUA). This EUA will remain in effect (meaning this test can  be used) for the duration of the COVID-19 declaration under Section 564(b)(1) of the Act, 21 U.S.C. section 360bbb-3(b)(1), unless the authorization is terminated or revoked.  Performed at [facility], [address] [place], [place] [address]   Blood Culture (routine x 2)     Status: None   Collection Time: 09/10/20  5:42 PM   Specimen: BLOOD  Result Value Ref Range Status   Specimen Description BLOOD RIGHT ANTECUBITAL  Final   Special Requests   Final    BOTTLES DRAWN AEROBIC AND ANAEROBIC Blood Culture adequate volume   Culture   Final    NO GROWTH 5 DAYS Performed at [facility], [address] [place], [place] [address]    Report Status 09/15/2020 FINAL  Final  Urine culture     Status: Abnormal   Collection Time: 09/10/20 11:34 PM   Specimen: Urine, Random  Result Value Ref Range Status   Specimen Description   Final    URINE, RANDOM Performed at [facility], [address] [place], [place] [address]    Special Requests   Final    NONE Performed at [facility], [place], [place] [address]    Culture >=100,000 COLONIES/mL PSEUDOMONAS AERUGINOSA (A)  Final   Report Status 09/13/2020 FINAL  Final   Organism ID, Bacteria PSEUDOMONAS AERUGINOSA (A)  Final      Susceptibility   Pseudomonas aeruginosa - MIC*    CEFTAZIDIME 4 SENSITIVE Sensitive     CIPROFLOXACIN <=0.25 SENSITIVE Sensitive     GENTAMICIN 2 SENSITIVE Sensitive     IMIPENEM 2 SENSITIVE Sensitive     PIP/TAZO 8 SENSITIVE Sensitive     CEFEPIME 4 SENSITIVE Sensitive     * >=100,000 COLONIES/mL PSEUDOMONAS AERUGINOSA      Scheduled Meds: . apixaban  5 mg Oral BID  . sodium chloride flush  3 mL Intravenous Q12H  . tamsulosin  0.4 mg Oral QPC supper   Continuous Infusions: . sodium chloride 250 mL (09/13/20 1714)  . ceFEPime (MAXIPIME) IV    . metronidazole 500 mg (09/15/20 1230)    Assessment/Plan:  1. Severe sepsis, present on admission  with acute hypoxic respiratory failure, bilateral aspiration pneumonia and Pseudomonas UTI.  Patient still on 5 L and saturating 100%.  Try to taper oxygen.  Patient on Flagyl and Maxipime 2. Bilateral aspiration pneumonia.  Did better yesterday with speech therapy and placed on a diet.  Modified barium swallow evaluation on  Monday.  Continue to treat pneumonia with cefepime and Flagyl 3. Pseudomonas UTI on Maxipime 4. Dysphagia.  Patient placed on dysphagia diet yesterday.  Patient thinks he will be able to swallow a few pills.  Will have a modified barium swallow evaluation on Monday 5. Iron deficiency anemia.  Was given 1 unit of packed red blood cells during the hospital course.  Today's hemoglobin 9.1 6. Recent left hip fracture.  Continue physical therapy evaluation.  Appreciate orthopedic evaluation. 7. Hypernatremia and hypokalemia.  Sodium and potassium within normal range.  Since patient placed on a diet will try to get rid of IV fluids 8. Hypophosphatemia replaced into the normal range yesterday 9. Hypomagnesemia.  Magnesium in normal range 10. Paroxysmal atrial fibrillation.  Patient placed on Eliquis and Lovenox injections discontinued. 11. History of hypertension and CABG 12. Hyperlipidemia unspecified.  Crestor on hold 13. History of prostate cancer. 14. Weakness.  Will end up needing rehab again.   Code Status:     Code Status Orders  (From admission, onward)         Start     Ordered   09/10/20 1927  Do not attempt resuscitation (DNR)  Continuous       Question Answer Comment  In the event of cardiac or respiratory ARREST Do not call a "code blue"   In the event of cardiac or respiratory ARREST Do not perform Intubation, CPR, defibrillation or ACLS   In the event of cardiac or respiratory ARREST Use medication by any route, position, wound care, and other measures to relive pain and suffering. May use oxygen, suction and manual treatment of airway obstruction as needed  for comfort.   Comments CODE STATUS was discussed and patient wishes to be placed on a DO NOT RESUSCITATE      09/10/20 1929        Code Status History    Date Active Date Inactive Code Status Order ID Comments User Context   09/02/2020 1018 09/07/2020 2011 DNR [id number]  Collier Bullock, MD ED   09/02/2020 0935 09/02/2020 1017 Full Code [id number]  Collier Bullock, MD ED   12/20/2019 1731 12/23/2019 1827 DNR [id number]  Nita Sells, MD Inpatient   12/19/2019 1343 12/20/2019 1731 Full Code [id number]  Lequita Halt, MD ED   03/08/2019 1132 03/10/2019 1627 Full Code [id number]  Lang Snow, NP ED   03/02/2019 2157 03/04/2019 1836 Full Code [id number]  Jani Gravel, MD ED   10/05/2018 1758 10/12/2018 0206 Full Code [id number]  [place], [place], DO ED   08/05/2018 0007 08/09/2018 1918 Full Code [id number]  Etta Quill, DO ED   01/05/2018 0309 01/06/2018 2143 Full Code [id number]  Rise Patience, MD ED   03/12/2017 1628 03/17/2017 2043 Full Code [id number]  Murlean Iba, MD Inpatient   Advance Care Planning Activity     Family Communication: Family at bedside Disposition Plan: Status is: Inpatient  Dispo: The patient is from: Rehab              Anticipated d/c is to: Rehab              Anticipated d/c date is: 09/18/2020              Patient currently needs a modified swallow evaluation on Monday   Difficult to place patient.  Hopefully not  Antibiotics:  Cefepime  Flagyl  Time spent: 27 minutes, case discussed with physical therapist  [employer]  [facility]

## 2020-09-15 NOTE — Progress Notes (Signed)
Subjective:  POD #12 s/p left hip hemiarthroplasty.  Patient readmitted due to aspiration pneumonia.  Patient is seen with his daughter at the bedside today.  Patient reports left hip pain as mild to moderate.    Objective:   VITALS:   Vitals:   09/15/20 0458 09/15/20 0622 09/15/20 0830 09/15/20 1208  BP:  128/67 (!) 159/73 (!) 146/61  Pulse:  72 74 68  Resp:  16 18 20   Temp:  98.9 F (37.2 C) 98 F (36.7 C) 98.5 F (36.9 C)  TempSrc:  Axillary    SpO2:  99% 98% 100%  Weight: 72 kg     Height:        PHYSICAL EXAM: Left lower extremity Neurovascular intact Sensation intact distally Intact pulses distally Dorsiflexion/Plantar flexion intact Dressing: moderate serous drainage No cellulitis present Compartment soft  LABS  Results for orders placed or performed during the hospital encounter of 09/10/20 (from the past 24 hour(s))  Comprehensive metabolic panel     Status: Abnormal   Collection Time: 09/15/20  4:04 AM  Result Value Ref Range   Sodium 139 135 - 145 mmol/L   Potassium 4.0 3.5 - 5.1 mmol/L   Chloride 114 (H) 98 - 111 mmol/L   CO2 21 (L) 22 - 32 mmol/L   Glucose, Bld 101 (H) 70 - 99 mg/dL   BUN 20 8 - 23 mg/dL   Creatinine, Ser 0.88 0.61 - 1.24 mg/dL   Calcium 7.3 (L) 8.9 - 10.3 mg/dL   Total Protein 4.7 (L) 6.5 - 8.1 g/dL   Albumin 1.9 (L) 3.5 - 5.0 g/dL   AST 16 15 - 41 U/L   ALT 22 0 - 44 U/L   Alkaline Phosphatase 62 38 - 126 U/L   Total Bilirubin 0.9 0.3 - 1.2 mg/dL   GFR, Estimated >60 >60 mL/min   Anion gap 4 (L) 5 - 15  Magnesium     Status: None   Collection Time: 09/15/20  4:04 AM  Result Value Ref Range   Magnesium 1.8 1.7 - 2.4 mg/dL  Phosphorus     Status: None   Collection Time: 09/15/20  4:04 AM  Result Value Ref Range   Phosphorus 2.7 2.5 - 4.6 mg/dL  CBC with Differential/Platelet     Status: Abnormal   Collection Time: 09/15/20  4:04 AM  Result Value Ref Range   WBC 10.8 (H) 4.0 - 10.5 K/uL   RBC 2.95 (L) 4.22 - 5.81 MIL/uL    Hemoglobin 9.1 (L) 13.0 - 17.0 g/dL   HCT 29.1 (L) 39.0 - 52.0 %   MCV 98.6 80.0 - 100.0 fL   MCH 30.8 26.0 - 34.0 pg   MCHC 31.3 30.0 - 36.0 g/dL   RDW 19.0 (H) 11.5 - 15.5 %   Platelets 308 150 - 400 K/uL   nRBC 0.0 0.0 - 0.2 %   Neutrophils Relative % 85 %   Neutro Abs 9.1 (H) 1.7 - 7.7 K/uL   Lymphocytes Relative 7 %   Lymphs Abs 0.8 0.7 - 4.0 K/uL   Monocytes Relative 5 %   Monocytes Absolute 0.5 0.1 - 1.0 K/uL   Eosinophils Relative 1 %   Eosinophils Absolute 0.1 0.0 - 0.5 K/uL   Basophils Relative 0 %   Basophils Absolute 0.0 0.0 - 0.1 K/uL   Immature Granulocytes 2 %   Abs Immature Granulocytes 0.23 (H) 0.00 - 0.07 K/uL    No results found.  Assessment/Plan:     Principal  Problem:   Severe sepsis (HCC) Active Problems:   Hx of CABG x 5 '06   Dyslipidemia   Essential hypertension   Chronic kidney disease (CKD), stage III (moderate) (HCC)   Prostate CA (HCC)   Longstanding persistent atrial fibrillation (HCC)   Generalized weakness   Physical deconditioning   Low back pain   GERD (gastroesophageal reflux disease)   Cystitis due to Pseudomonas   Iron deficiency anemia   History of UTI   Closed left hip fracture (HCC)   AF (paroxysmal atrial fibrillation) (HCC)   Acute respiratory failure with hypoxia (HCC)   Aspiration pneumonia of both lower lobes due to gastric secretions (HCC)   Dysphagia   Hypernatremia   Hypokalemia   Hypophosphatasia  Continue dressing changes as needed.  Physical therapy as the patient can tolerate.  Continue Eliquis.      Thornton Park , MD 09/15/2020, 12:49 PM

## 2020-09-16 DIAGNOSIS — A419 Sepsis, unspecified organism: Secondary | ICD-10-CM | POA: Diagnosis not present

## 2020-09-16 DIAGNOSIS — J690 Pneumonitis due to inhalation of food and vomit: Secondary | ICD-10-CM | POA: Diagnosis not present

## 2020-09-16 DIAGNOSIS — D500 Iron deficiency anemia secondary to blood loss (chronic): Secondary | ICD-10-CM

## 2020-09-16 DIAGNOSIS — R1310 Dysphagia, unspecified: Secondary | ICD-10-CM | POA: Diagnosis not present

## 2020-09-16 DIAGNOSIS — J9601 Acute respiratory failure with hypoxia: Secondary | ICD-10-CM | POA: Diagnosis not present

## 2020-09-16 LAB — COMPREHENSIVE METABOLIC PANEL
ALT: 18 U/L (ref 0–44)
AST: 18 U/L (ref 15–41)
Albumin: 1.9 g/dL — ABNORMAL LOW (ref 3.5–5.0)
Alkaline Phosphatase: 64 U/L (ref 38–126)
Anion gap: 5 (ref 5–15)
BUN: 17 mg/dL (ref 8–23)
CO2: 20 mmol/L — ABNORMAL LOW (ref 22–32)
Calcium: 7.7 mg/dL — ABNORMAL LOW (ref 8.9–10.3)
Chloride: 115 mmol/L — ABNORMAL HIGH (ref 98–111)
Creatinine, Ser: 0.85 mg/dL (ref 0.61–1.24)
GFR, Estimated: >60 mL/min (ref >60)
Glucose, Bld: 90 mg/dL (ref 70–99)
Potassium: 4 mmol/L (ref 3.5–5.1)
Sodium: 140 mmol/L (ref 135–145)
Total Bilirubin: 1 mg/dL (ref 0.3–1.2)
Total Protein: 4.6 g/dL — ABNORMAL LOW (ref 6.5–8.1)

## 2020-09-16 LAB — CBC WITH DIFFERENTIAL/PLATELET
Abs Immature Granulocytes: 0.26 10*3/uL — ABNORMAL HIGH (ref 0.00–0.07)
Basophils Absolute: 0 10*3/uL (ref 0.0–0.1)
Basophils Relative: 0 %
Eosinophils Absolute: 0.1 10*3/uL (ref 0.0–0.5)
Eosinophils Relative: 1 %
HCT: 28.3 % — ABNORMAL LOW (ref 39.0–52.0)
Hemoglobin: 8.7 g/dL — ABNORMAL LOW (ref 13.0–17.0)
Immature Granulocytes: 3 %
Lymphocytes Relative: 9 %
Lymphs Abs: 0.8 10*3/uL (ref 0.7–4.0)
MCH: 30.9 pg (ref 26.0–34.0)
MCHC: 30.7 g/dL (ref 30.0–36.0)
MCV: 100.4 fL — ABNORMAL HIGH (ref 80.0–100.0)
Monocytes Absolute: 0.4 10*3/uL (ref 0.1–1.0)
Monocytes Relative: 5 %
Neutro Abs: 7.2 10*3/uL (ref 1.7–7.7)
Neutrophils Relative %: 82 %
Platelets: 310 10*3/uL (ref 150–400)
RBC: 2.82 MIL/uL — ABNORMAL LOW (ref 4.22–5.81)
RDW: 18.9 % — ABNORMAL HIGH (ref 11.5–15.5)
WBC: 8.8 10*3/uL (ref 4.0–10.5)
nRBC: 0 % (ref 0.0–0.2)

## 2020-09-16 LAB — PHOSPHORUS: Phosphorus: 2.6 mg/dL (ref 2.5–4.6)

## 2020-09-16 LAB — MAGNESIUM: Magnesium: 2 mg/dL (ref 1.7–2.4)

## 2020-09-16 MED ORDER — MIRTAZAPINE 15 MG PO TABS
7.5000 mg | ORAL_TABLET | Freq: Every day | ORAL | Status: DC
  Administered 2020-09-16 – 2020-09-17 (×2): 7.5 mg via ORAL
  Filled 2020-09-16 (×2): qty 1

## 2020-09-16 MED ORDER — MAGIC MOUTHWASH W/LIDOCAINE
5.0000 mL | Freq: Four times a day (QID) | ORAL | Status: DC
  Administered 2020-09-16 – 2020-09-18 (×10): 5 mL via ORAL
  Filled 2020-09-16 (×12): qty 5

## 2020-09-16 NOTE — Progress Notes (Signed)
Patient ID: Carl [name], male   DOB: 1936-06-01, 85 y.o.   MRN: [id number] [facility] PROGRESS NOTE  Carl [name] HYW:[id number] DOB: 1936-02-06 DOA: 09/10/2020 PCP: [name], MD  HPI/Subjective: Patient states he does not have an appetite.  He did not eat much yesterday afternoon and last night.  Admitted with severe sepsis and aspiration pneumonia.  Has slight cough but improving.  Objective: Vitals:   09/16/20 0835 09/16/20 1115  BP: (!) 167/61 135/61  Pulse: 71 74  Resp: 18 18  Temp: 98.2 F (36.8 C) 98.2 F (36.8 C)  SpO2: 100% 100%    Intake/Output Summary (Last 24 hours) at 09/16/2020 1217 Last data filed at 09/16/2020 1010 Gross per 24 hour  Intake 949.32 ml  Output --  Net 949.32 ml   Filed Weights   09/14/20 0457 09/15/20 0458 09/16/20 0500  Weight: 72 kg 72 kg 72.3 kg    ROS: Review of Systems  Respiratory: Positive for cough. Negative for shortness of breath.   Cardiovascular: Negative for chest pain.  Gastrointestinal: Negative for abdominal pain, nausea and vomiting.   Exam: Physical Exam HENT:     Head: Normocephalic.     Mouth/Throat:     Comments: Back of throat mouth ulcers Eyes:     General: Lids are normal.     Conjunctiva/sclera: Conjunctivae normal.  Cardiovascular:     Rate and Rhythm: Normal rate and regular rhythm.     Heart sounds: Normal heart sounds, S1 normal and S2 normal.  Pulmonary:     Breath sounds: Examination of the right-lower field reveals decreased breath sounds. Examination of the left-lower field reveals decreased breath sounds. Decreased breath sounds present. No wheezing, rhonchi or rales.  Abdominal:     Palpations: Abdomen is soft.     Tenderness: There is no abdominal tenderness.  Musculoskeletal:     Right lower leg: No swelling.     Left lower leg: No swelling.  Skin:    General: Skin is warm.     Findings: No rash.  Neurological:     Mental Status: He is alert.     Comments: Answers  all questions appropriately.       Data Reviewed: Basic Metabolic Panel: Recent Labs  Lab 09/11/20 0602 09/13/20 0517 09/14/20 0543 09/15/20 0404 09/16/20 0343  NA 148* 146* 141 139 140  K 3.3* 3.2* 3.2* 4.0 4.0  CL 117* 121* 118* 114* 115*  CO2 22 18* 18* 21* 20*  GLUCOSE 109* 130* 113* 101* 90  BUN 50* 35* 25* 20 17  CREATININE 1.42* 1.12 1.00 0.88 0.85  CALCIUM 7.8* 7.7* 7.4* 7.3* 7.7*  MG  --  2.0 1.7 1.8 2.0  PHOS  --  2.1* 1.9* 2.7 2.6   Liver Function Tests: Recent Labs  Lab 09/11/20 0602 09/13/20 0517 09/14/20 0543 09/15/20 0404 09/16/20 0343  AST 40 28 18 16 18   ALT 55* 34 26 22 18   ALKPHOS 73 64 61 62 64  BILITOT 1.7* 1.1 1.0 0.9 1.0  PROT 5.1* 4.6* 4.4* 4.7* 4.6*  ALBUMIN 2.3* 1.9* 1.8* 1.9* 1.9*   CBC: Recent Labs  Lab 09/10/20 1740 09/11/20 0602 09/13/20 0517 09/14/20 0543 09/15/20 0404 09/16/20 0343  WBC 15.2* 15.2* 9.2 9.2 10.8* 8.8  NEUTROABS 14.0*  --  8.3* 7.9* 9.1* 7.2  HGB 7.5* 7.6* 7.1* 8.3* 9.1* 8.7*  HCT 23.4* 24.0* 22.9* 25.9* 29.1* 28.3*  MCV 100.9* 101.3* 102.2* 97.4 98.6 100.4*  PLT 199 215 243 263  308 310     Recent Results (from the past 240 hour(s))  SARS CORONAVIRUS 2 (TAT 6-24 HRS) Nasopharyngeal Nasopharyngeal Swab     Status: None   Collection Time: 09/06/20  3:45 PM   Specimen: Nasopharyngeal Swab  Result Value Ref Range Status   SARS Coronavirus 2 NEGATIVE NEGATIVE Final    Comment: (NOTE) SARS-CoV-2 target nucleic acids are NOT DETECTED.  The SARS-CoV-2 RNA is generally detectable in upper and lower respiratory specimens during the acute phase of infection. Negative results do not preclude SARS-CoV-2 infection, do not rule out co-infections with other pathogens, and should not be used as the sole basis for treatment or other patient management decisions. Negative results must be combined with clinical observations, patient history, and epidemiological information. The expected result is Negative.  Fact  Sheet for Patients: SugarRoll.be  Fact Sheet for Healthcare Providers: https://www.woods-mathews.com/  This test is not yet approved or cleared by the [place] FDA and  has been authorized for detection and/or diagnosis of SARS-CoV-2 by FDA under an Emergency Use Authorization (EUA). This EUA will remain  in effect (meaning this test can be used) for the duration of the COVID-19 declaration under Se ction 564(b)(1) of the Act, 21 U.S.C. section 360bbb-3(b)(1), unless the authorization is terminated or revoked sooner.  Performed at [facility], [place] [address], [place], [place] [address]   Blood Culture (routine x 2)     Status: None   Collection Time: 09/10/20  5:37 PM   Specimen: BLOOD  Result Value Ref Range Status   Specimen Description BLOOD LEFT ANTECUBITAL  Final   Special Requests   Final    BOTTLES DRAWN AEROBIC AND ANAEROBIC Blood Culture adequate volume   Culture   Final    NO GROWTH 5 DAYS Performed at [facility], [address] [place], [place] [address]    Report Status 09/15/2020 FINAL  Final  Resp Panel by RT-PCR (Flu A&B, Covid) Nasopharyngeal Swab     Status: None   Collection Time: 09/10/20  5:39 PM   Specimen: Nasopharyngeal Swab; Nasopharyngeal(NP) swabs in vial transport medium  Result Value Ref Range Status   SARS Coronavirus 2 by RT PCR NEGATIVE NEGATIVE Final    Comment: (NOTE) SARS-CoV-2 target nucleic acids are NOT DETECTED.  The SARS-CoV-2 RNA is generally detectable in upper respiratory specimens during the acute phase of infection. The lowest concentration of SARS-CoV-2 viral copies this assay can detect is 138 copies/mL. A negative result does not preclude SARS-Cov-2 infection and should not be used as the sole basis for treatment or other patient management decisions. A negative result may occur with  improper specimen collection/handling, submission of specimen other than  nasopharyngeal swab, presence of viral mutation(s) within the areas targeted by this assay, and inadequate number of viral copies(<138 copies/mL). A negative result must be combined with clinical observations, patient history, and epidemiological information. The expected result is Negative.  Fact Sheet for Patients:  EntrepreneurPulse.com.au  Fact Sheet for Healthcare Providers:  IncredibleEmployment.be  This test is no t yet approved or cleared by the [place] FDA and  has been authorized for detection and/or diagnosis of SARS-CoV-2 by FDA under an Emergency Use Authorization (EUA). This EUA will remain  in effect (meaning this test can be used) for the duration of the COVID-19 declaration under Section 564(b)(1) of the Act, 21 U.S.C.section 360bbb-3(b)(1), unless the authorization is terminated  or revoked sooner.       Influenza A by PCR  NEGATIVE NEGATIVE Final   Influenza B by PCR NEGATIVE NEGATIVE Final    Comment: (NOTE) The Xpert Xpress SARS-CoV-2/FLU/RSV plus assay is intended as an aid in the diagnosis of influenza from Nasopharyngeal swab specimens and should not be used as a sole basis for treatment. Nasal washings and aspirates are unacceptable for Xpert Xpress SARS-CoV-2/FLU/RSV testing.  Fact Sheet for Patients: EntrepreneurPulse.com.au  Fact Sheet for Healthcare Providers: IncredibleEmployment.be  This test is not yet approved or cleared by the [place] FDA and has been authorized for detection and/or diagnosis of SARS-CoV-2 by FDA under an Emergency Use Authorization (EUA). This EUA will remain in effect (meaning this test can be used) for the duration of the COVID-19 declaration under Section 564(b)(1) of the Act, 21 U.S.C. section 360bbb-3(b)(1), unless the authorization is terminated or revoked.  Performed at [facility], [address] [place], [place]  [address]   Blood Culture (routine x 2)     Status: None   Collection Time: 09/10/20  5:42 PM   Specimen: BLOOD  Result Value Ref Range Status   Specimen Description BLOOD RIGHT ANTECUBITAL  Final   Special Requests   Final    BOTTLES DRAWN AEROBIC AND ANAEROBIC Blood Culture adequate volume   Culture   Final    NO GROWTH 5 DAYS Performed at [facility], [address] [place], [place] [address]    Report Status 09/15/2020 FINAL  Final  Urine culture     Status: Abnormal   Collection Time: 09/10/20 11:34 PM   Specimen: Urine, Random  Result Value Ref Range Status   Specimen Description   Final    URINE, RANDOM Performed at [facility], [address], [place], [place] [address]    Special Requests   Final    NONE Performed at [facility], [place], [place] [address]    Culture >=100,000 COLONIES/mL PSEUDOMONAS AERUGINOSA (A)  Final   Report Status 09/13/2020 FINAL  Final   Organism ID, Bacteria PSEUDOMONAS AERUGINOSA (A)  Final      Susceptibility   Pseudomonas aeruginosa - MIC*    CEFTAZIDIME 4 SENSITIVE Sensitive     CIPROFLOXACIN <=0.25 SENSITIVE Sensitive     GENTAMICIN 2 SENSITIVE Sensitive     IMIPENEM 2 SENSITIVE Sensitive     PIP/TAZO 8 SENSITIVE Sensitive     CEFEPIME 4 SENSITIVE Sensitive     * >=100,000 COLONIES/mL PSEUDOMONAS AERUGINOSA      Scheduled Meds: . apixaban  5 mg Oral BID  . magic mouthwash w/lidocaine  5 mL Oral QID  . mirtazapine  7.5 mg Oral QHS  . sodium chloride flush  3 mL Intravenous Q12H  . tamsulosin  0.4 mg Oral QPC supper   Continuous Infusions: . sodium chloride 250 mL (09/13/20 1714)  . ceFEPime (MAXIPIME) IV 2 g (09/16/20 0240)  . metronidazole 500 mg (09/16/20 1015)    Assessment/Plan:  1. Severe sepsis, present on admission with acute hypoxic respiratory failure, bilateral aspiration pneumonia and Pseudomonas UTI.  This morning when I saw the patient he was on 5 L and saturating  well.  Asked the nursing staff to try to taper him down.  Patient on Flagyl and Maxipime through today and then will likely be on p.o. Cipro for a few more days for Pseudomonas UTI. 2. Bilateral aspiration pneumonia.  Patient on a dysphagia diet.  Did not eat very much.  Modified barium swallow on Monday.  Continue to treat pneumonia with antibiotics.  3. Dysphagia.  Modified barium swallow evaluation on Monday.  Patient has some ulcerations in his mouth and will put on Magic mouthwash.  Poor appetite and will put on Remeron at night. 4. Iron deficiency anemia.  Today's hemoglobin 8.7. 5. Hypernatremia, hypokalemia, hypophosphatemia and hypomagnesemia.  Electrolytes acceptable today. 6. Recent hip fracture will need rehab. 7. Paroxysmal atrial fibrillation on Eliquis for anticoagulation 8. Hyperlipidemia unspecified.  Crestor on hold 9. History of prostate cancer. 10. Stage II decubiti, present on admission.  See description below  Pressure Injury 10/09/18 Stage II -  Partial thickness loss of dermis presenting as a shallow open ulcer with a red, pink wound bed without slough. pt had this wound from home per his report; chronic partial thickness wounds related to shear, not pressur (Active)  10/09/18 0730  Location: Buttocks  Location Orientation: Left;Right  Staging: Stage II -  Partial thickness loss of dermis presenting as a shallow open ulcer with a red, pink wound bed without slough.  Wound Description (Comments): pt had this wound from home per his report; chronic partial thickness wounds related to shear, not pressure  Present on Admission: Yes       Code Status:     Code Status Orders  (From admission, onward)         Start     Ordered   09/10/20 1927  Do not attempt resuscitation (DNR)  Continuous       Question Answer Comment  In the event of cardiac or respiratory ARREST Do not call a "code blue"   In the event of cardiac or respiratory ARREST Do not perform Intubation,  CPR, defibrillation or ACLS   In the event of cardiac or respiratory ARREST Use medication by any route, position, wound care, and other measures to relive pain and suffering. May use oxygen, suction and manual treatment of airway obstruction as needed for comfort.   Comments CODE STATUS was discussed and patient wishes to be placed on a DO NOT RESUSCITATE      09/10/20 1929        Code Status History    Date Active Date Inactive Code Status Order ID Comments User Context   09/02/2020 1018 09/07/2020 2011 DNR [id number]  Collier Bullock, MD ED   09/02/2020 0935 09/02/2020 1017 Full Code [id number]  Collier Bullock, MD ED   12/20/2019 1731 12/23/2019 1827 DNR [id number]  Nita Sells, MD Inpatient   12/19/2019 1343 12/20/2019 1731 Full Code [id number]  Lequita Halt, MD ED   03/08/2019 1132 03/10/2019 1627 Full Code [id number]  Lang Snow, NP ED   03/02/2019 2157 03/04/2019 1836 Full Code [id number]  Jani Gravel, MD ED   10/05/2018 1758 10/12/2018 0206 Full Code [id number]  [place], [place], DO ED   08/05/2018 0007 08/09/2018 1918 Full Code [id number]  Etta Quill, DO ED   01/05/2018 0309 01/06/2018 2143 Full Code [id number]  Rise Patience, MD ED   03/12/2017 1628 03/17/2017 2043 Full Code [id number]  Murlean Iba, MD Inpatient   Advance Care Planning Activity     Family Communication: Spoke with family on the phone Disposition Plan: Status is: Inpatient  Dispo: The patient is from: Rehab              Anticipated d/c is to: Rehab              Anticipated d/c date is: Potentially 09/18/2020              Patient currently will  have a swallow evaluation tomorrow to assess risk on his eating.   Difficult to place patient.  Hopefully not  Time spent: 27 minutes  [place]

## 2020-09-16 NOTE — Progress Notes (Signed)
Physical Therapy Treatment Patient Details Name: Carl [name] MRN: [id number] DOB: Apr 05, 1936 Today's Date: 09/16/2020    History of Present Illness Pt is an 85 y/o M with PMH: pAFib on Eliquis, dCHF, AAA, stage III CKD, CAD and recent adm to [facility] with fall to L side and was s/p L hemiarthroplasty with Dr. Sabra Heck. Pt re-presented from [facility] where he went after hip sx, d/t SOB. Pt found to have b/l aspiration PNA on imaging and adm with sepsis.    PT Comments    Pt reports increased soreness today but willing to try.  Participated in exercises as described below.  He is able to get to EOB with max a x 1 and remain sitting x 8 minutes with use of rail but no outside physical assist once upright.  He continues to lean to right.  Total assist to return to supine and reposition in bed.     Follow Up Recommendations  SNF     Equipment Recommendations       Recommendations for Other Services       Precautions / Restrictions Precautions Precautions: Posterior Hip;Fall Precaution Booklet Issued: No Precaution Comments: hip abduction pillow; aspiration Restrictions Weight Bearing Restrictions: Yes LLE Weight Bearing: Partial weight bearing    Mobility  Bed Mobility Overal bed mobility: Needs Assistance Bed Mobility: Supine to Sit;Sit to Supine     Supine to sit: Max assist Sit to supine: Max assist;Total assist        Transfers                 General transfer comment: unable to attempt  Ambulation/Gait                 Stairs             Wheelchair Mobility    Modified Rankin (Stroke Patients Only)       Balance Overall balance assessment: Needs assistance Sitting-balance support: Single extremity supported;Feet supported Sitting balance-Leahy Scale: Fair Sitting balance - Comments: R lateral lean throughout sitting but with cues is able to correct. close SBA throughout sitting at EOB                                     Cognition Arousal/Alertness: Awake/alert Behavior During Therapy: WFL for tasks assessed/performed Overall Cognitive Status: Within Functional Limits for tasks assessed                                        Exercises Other Exercises Other Exercises: BLE AA/PROM x 10 supine, seated LAQ    General Comments        Pertinent Vitals/Pain Pain Assessment: Faces Faces Pain Scale: Hurts little more Pain Location: L hip/thigh Pain Descriptors / Indicators: Grimacing;Operative site guarding;Tender;Sore Pain Intervention(s): Limited activity within patient's tolerance;Repositioned    Home Living                      Prior Function            PT Goals (current goals can now be found in the care plan section) Progress towards PT goals: Progressing toward goals    Frequency    7X/week      PT Plan Current plan remains appropriate    Co-evaluation  AM-PAC PT "6 Clicks" Mobility   Outcome Measure  Help needed turning from your back to your side while in a flat bed without using bedrails?: A Lot Help needed moving from lying on your back to sitting on the side of a flat bed without using bedrails?: A Lot Help needed moving to and from a bed to a chair (including a wheelchair)?: Total Help needed standing up from a chair using your arms (e.g., wheelchair or bedside chair)?: Total Help needed to walk in hospital room?: Total Help needed climbing 3-5 steps with a railing? : Total 6 Click Score: 8    End of Session Equipment Utilized During Treatment: Oxygen Activity Tolerance: Patient tolerated treatment well Patient left: in bed;with call bell/phone within reach;with bed alarm set;Other (comment) Nurse Communication: Mobility status;Precautions PT Visit Diagnosis: Muscle weakness (generalized) (M62.81);Other abnormalities of gait and mobility (R26.89);History of falling (Z91.81);Pain Pain - Right/Left: Left Pain - part of  body: Hip     Time: 8483-5075 PT Time Calculation (min) (ACUTE ONLY): 25 min  Charges:  $Therapeutic Exercise: 8-22 mins $Therapeutic Activity: 8-22 mins                    Chesley Noon, PTA 09/16/20, 10:16 AM

## 2020-09-17 ENCOUNTER — Ambulatory Visit: Payer: Medicare Other | Admitting: Family Medicine

## 2020-09-17 ENCOUNTER — Inpatient Hospital Stay: Payer: Medicare Other

## 2020-09-17 DIAGNOSIS — D508 Other iron deficiency anemias: Secondary | ICD-10-CM | POA: Diagnosis not present

## 2020-09-17 DIAGNOSIS — A419 Sepsis, unspecified organism: Secondary | ICD-10-CM | POA: Diagnosis not present

## 2020-09-17 DIAGNOSIS — R1310 Dysphagia, unspecified: Secondary | ICD-10-CM | POA: Diagnosis not present

## 2020-09-17 DIAGNOSIS — Z515 Encounter for palliative care: Secondary | ICD-10-CM | POA: Diagnosis not present

## 2020-09-17 DIAGNOSIS — J690 Pneumonitis due to inhalation of food and vomit: Secondary | ICD-10-CM | POA: Diagnosis not present

## 2020-09-17 DIAGNOSIS — L8990 Pressure ulcer of unspecified site, unspecified stage: Secondary | ICD-10-CM | POA: Insufficient documentation

## 2020-09-17 DIAGNOSIS — Z7189 Other specified counseling: Secondary | ICD-10-CM | POA: Diagnosis not present

## 2020-09-17 NOTE — Progress Notes (Signed)
Physical Therapy Treatment Patient Details Name: Carl [name] MRN: [id number] DOB: 15-Jan-1936 Today's Date: 09/17/2020    History of Present Illness Pt is an 85 y/o M with PMH: pAFib on Eliquis, dCHF, AAA, stage III CKD, CAD and recent adm to [facility] with fall to L side and was s/p L hemiarthroplasty with Dr. Sabra Heck. Pt re-presented from [facility] where he went after hip sx, d/t SOB. Pt found to have b/l aspiration PNA on imaging and adm with sepsis.    PT Comments    Pt received in bed, awake/alert, agreed to B LE ROM and declined EOB activity due to not feeling well.  Pt unable to recall L hip precautions.  ABD wedge removed for B LE AAROM 10-20 reps each.  Pt c/o 8/10 L hip pain with minimal movement.  Pt continues to have a wet productive cough, remained on 4L O2 sats down to 94% upon exertion, otherwise remained at 99%.  ABD wedge reapplied, heels floating, bed lowered with side rails up, call bell in hand.  Fair tolerance for PT session.  Continue to recommend SNF once medically stable.   Follow Up Recommendations  SNF     Equipment Recommendations        Recommendations for Other Services       Precautions / Restrictions Precautions Precautions: Posterior Hip;Fall Precaution Booklet Issued: No Precaution Comments: hip abduction pillow; aspiration Restrictions Weight Bearing Restrictions: Yes LLE Weight Bearing: Partial weight bearing    Mobility  Bed Mobility               General bed mobility comments: Pt refused sitting EOB    Transfers                    Ambulation/Gait                 Stairs             Wheelchair Mobility    Modified Rankin (Stroke Patients Only)       Balance                                            Cognition Arousal/Alertness: Awake/alert Behavior During Therapy: WFL for tasks assessed/performed Overall Cognitive Status: Within Functional Limits for tasks assessed Area of  Impairment: Orientation;Memory;Following commands;Problem solving                 Orientation Level: Disoriented to;Time Current Attention Level: Focused Memory: Decreased recall of precautions Following Commands: Follows one step commands consistently;Follows one step commands with increased time              Exercises      General Comments        Pertinent Vitals/Pain Pain Assessment: 0-10 Pain Score: 8  Pain Location: L hip/thigh Pain Descriptors / Indicators: Grimacing;Operative site guarding;Tender;Sore Pain Intervention(s): Monitored during session    Home Living                      Prior Function            PT Goals (current goals can now be found in the care plan section) Acute Rehab PT Goals Patient Stated Goal: get stronger    Frequency    7X/week      PT Plan Current plan remains appropriate    Co-evaluation  AM-PAC PT "6 Clicks" Mobility   Outcome Measure  Help needed turning from your back to your side while in a flat bed without using bedrails?: A Lot Help needed moving from lying on your back to sitting on the side of a flat bed without using bedrails?: A Lot Help needed moving to and from a bed to a chair (including a wheelchair)?: Total Help needed standing up from a chair using your arms (e.g., wheelchair or bedside chair)?: Total Help needed to walk in hospital room?: Total Help needed climbing 3-5 steps with a railing? : Total 6 Click Score: 8    End of Session Equipment Utilized During Treatment: Oxygen Activity Tolerance: Patient limited by pain;Other (comment) (mobility declined) Patient left: in bed;with call bell/phone within reach;with bed alarm set (ABD wedge in place, towel to prevent L LE internal rotaion)   PT Visit Diagnosis: Muscle weakness (generalized) (M62.81);Other abnormalities of gait and mobility (R26.89);History of falling (Z91.81);Pain Pain - Right/Left: Left Pain - part of  body: Hip     Time: 4034-7425 PT Time Calculation (min) (ACUTE ONLY): 20 min  Charges:  $Therapeutic Exercise: 8-22 mins                     [name], PTA   [name] 09/17/2020, 11:31 AM

## 2020-09-17 NOTE — Progress Notes (Signed)
Patient ID: Carl [name], male   DOB: 1935-12-15, 85 y.o.   MRN: [id number] [facility] PROGRESS NOTE  Carl [name] VQM:[id number] DOB: November 23, 1935 DOA: 09/10/2020 PCP: [name], MD  HPI/Subjective: Patient states that he ate better this morning but did not eat much yesterday.  Still with some cough.  Down to 2 L oxygen when I dialed him down in the room.  Admitted with severe sepsis.  Objective: Vitals:   09/17/20 0730 09/17/20 1248  BP: (!) 164/74 (!) 149/58  Pulse: 78 72  Resp:  18  Temp:  99 F (37.2 C)  SpO2:  97%    Intake/Output Summary (Last 24 hours) at 09/17/2020 1346 Last data filed at 09/17/2020 1340 Gross per 24 hour  Intake 500 ml  Output 1500 ml  Net -1000 ml   Filed Weights   09/15/20 0458 09/16/20 0500 09/17/20 0500  Weight: 72 kg 72.3 kg 74.9 kg    ROS: Review of Systems  Respiratory: Negative for shortness of breath.   Cardiovascular: Negative for chest pain.  Gastrointestinal: Negative for abdominal pain, nausea and vomiting.   Exam: Physical Exam HENT:     Head: Normocephalic.     Mouth/Throat:     Comments: Sores in the back of the mouth. Eyes:     General: Lids are normal.     Conjunctiva/sclera: Conjunctivae normal.  Cardiovascular:     Rate and Rhythm: Normal rate and regular rhythm.     Heart sounds: Normal heart sounds, S1 normal and S2 normal.  Pulmonary:     Breath sounds: Examination of the right-lower field reveals decreased breath sounds and rhonchi. Examination of the left-lower field reveals decreased breath sounds. Decreased breath sounds and rhonchi present. No wheezing or rales.  Abdominal:     Palpations: Abdomen is soft.     Tenderness: There is no abdominal tenderness.  Musculoskeletal:     Right lower leg: No swelling.     Left lower leg: No swelling.  Skin:    General: Skin is warm.     Findings: No rash.  Neurological:     Mental Status: He is alert.     Comments: Answers some questions.   Asking about his heart and lungs.       Data Reviewed: Basic Metabolic Panel: Recent Labs  Lab 09/11/20 0602 09/13/20 0517 09/14/20 0543 09/15/20 0404 09/16/20 0343  NA 148* 146* 141 139 140  K 3.3* 3.2* 3.2* 4.0 4.0  CL 117* 121* 118* 114* 115*  CO2 22 18* 18* 21* 20*  GLUCOSE 109* 130* 113* 101* 90  BUN 50* 35* 25* 20 17  CREATININE 1.42* 1.12 1.00 0.88 0.85  CALCIUM 7.8* 7.7* 7.4* 7.3* 7.7*  MG  --  2.0 1.7 1.8 2.0  PHOS  --  2.1* 1.9* 2.7 2.6   Liver Function Tests: Recent Labs  Lab 09/11/20 0602 09/13/20 0517 09/14/20 0543 09/15/20 0404 09/16/20 0343  AST 40 28 18 16 18   ALT 55* 34 26 22 18   ALKPHOS 73 64 61 62 64  BILITOT 1.7* 1.1 1.0 0.9 1.0  PROT 5.1* 4.6* 4.4* 4.7* 4.6*  ALBUMIN 2.3* 1.9* 1.8* 1.9* 1.9*   CBC: Recent Labs  Lab 09/10/20 1740 09/11/20 0602 09/13/20 0517 09/14/20 0543 09/15/20 0404 09/16/20 0343  WBC 15.2* 15.2* 9.2 9.2 10.8* 8.8  NEUTROABS 14.0*  --  8.3* 7.9* 9.1* 7.2  HGB 7.5* 7.6* 7.1* 8.3* 9.1* 8.7*  HCT 23.4* 24.0* 22.9* 25.9* 29.1* 28.3*  MCV  100.9* 101.3* 102.2* 97.4 98.6 100.4*  PLT 199 215 243 263 308 310    BNP (last 3 results) Recent Labs    02/07/20 1458  BNP 112.8*     Recent Results (from the past 240 hour(s))  Blood Culture (routine x 2)     Status: None   Collection Time: 09/10/20  5:37 PM   Specimen: BLOOD  Result Value Ref Range Status   Specimen Description BLOOD LEFT ANTECUBITAL  Final   Special Requests   Final    BOTTLES DRAWN AEROBIC AND ANAEROBIC Blood Culture adequate volume   Culture   Final    NO GROWTH 5 DAYS Performed at [facility], [address] [place], [place] [address]    Report Status 09/15/2020 FINAL  Final  Resp Panel by RT-PCR (Flu A&B, Covid) Nasopharyngeal Swab     Status: None   Collection Time: 09/10/20  5:39 PM   Specimen: Nasopharyngeal Swab; Nasopharyngeal(NP) swabs in vial transport medium  Result Value Ref Range Status   SARS Coronavirus 2 by RT PCR  NEGATIVE NEGATIVE Final    Comment: (NOTE) SARS-CoV-2 target nucleic acids are NOT DETECTED.  The SARS-CoV-2 RNA is generally detectable in upper respiratory specimens during the acute phase of infection. The lowest concentration of SARS-CoV-2 viral copies this assay can detect is 138 copies/mL. A negative result does not preclude SARS-Cov-2 infection and should not be used as the sole basis for treatment or other patient management decisions. A negative result may occur with  improper specimen collection/handling, submission of specimen other than nasopharyngeal swab, presence of viral mutation(s) within the areas targeted by this assay, and inadequate number of viral copies(<138 copies/mL). A negative result must be combined with clinical observations, patient history, and epidemiological information. The expected result is Negative.  Fact Sheet for Patients:  EntrepreneurPulse.com.au  Fact Sheet for Healthcare Providers:  IncredibleEmployment.be  This test is no t yet approved or cleared by the [place] FDA and  has been authorized for detection and/or diagnosis of SARS-CoV-2 by FDA under an Emergency Use Authorization (EUA). This EUA will remain  in effect (meaning this test can be used) for the duration of the COVID-19 declaration under Section 564(b)(1) of the Act, 21 U.S.C.section 360bbb-3(b)(1), unless the authorization is terminated  or revoked sooner.       Influenza A by PCR NEGATIVE NEGATIVE Final   Influenza B by PCR NEGATIVE NEGATIVE Final    Comment: (NOTE) The Xpert Xpress SARS-CoV-2/FLU/RSV plus assay is intended as an aid in the diagnosis of influenza from Nasopharyngeal swab specimens and should not be used as a sole basis for treatment. Nasal washings and aspirates are unacceptable for Xpert Xpress SARS-CoV-2/FLU/RSV testing.  Fact Sheet for Patients: EntrepreneurPulse.com.au  Fact Sheet for  Healthcare Providers: IncredibleEmployment.be  This test is not yet approved or cleared by the [place] FDA and has been authorized for detection and/or diagnosis of SARS-CoV-2 by FDA under an Emergency Use Authorization (EUA). This EUA will remain in effect (meaning this test can be used) for the duration of the COVID-19 declaration under Section 564(b)(1) of the Act, 21 U.S.C. section 360bbb-3(b)(1), unless the authorization is terminated or revoked.  Performed at [facility], [address] [place], [place] [address]   Blood Culture (routine x 2)     Status: None   Collection Time: 09/10/20  5:42 PM   Specimen: BLOOD  Result Value Ref Range Status   Specimen Description BLOOD RIGHT ANTECUBITAL  Final  Special Requests   Final    BOTTLES DRAWN AEROBIC AND ANAEROBIC Blood Culture adequate volume   Culture   Final    NO GROWTH 5 DAYS Performed at [facility], [address] [place], [place] [address]    Report Status 09/15/2020 FINAL  Final  Urine culture     Status: Abnormal   Collection Time: 09/10/20 11:34 PM   Specimen: Urine, Random  Result Value Ref Range Status   Specimen Description   Final    URINE, RANDOM Performed at [facility], [address], [place], [place] [address]    Special Requests   Final    NONE Performed at [facility], [place], [place] [address]    Culture >=100,000 COLONIES/mL PSEUDOMONAS AERUGINOSA (A)  Final   Report Status 09/13/2020 FINAL  Final   Organism ID, Bacteria PSEUDOMONAS AERUGINOSA (A)  Final      Susceptibility   Pseudomonas aeruginosa - MIC*    CEFTAZIDIME 4 SENSITIVE Sensitive     CIPROFLOXACIN <=0.25 SENSITIVE Sensitive     GENTAMICIN 2 SENSITIVE Sensitive     IMIPENEM 2 SENSITIVE Sensitive     PIP/TAZO 8 SENSITIVE Sensitive     CEFEPIME 4 SENSITIVE Sensitive     * >=100,000 COLONIES/mL PSEUDOMONAS AERUGINOSA     Studies: DG Chest Port  1 View  Result Date: 09/17/2020 CLINICAL DATA:  Status post fall. EXAM: PORTABLE CHEST 1 VIEW COMPARISON:  September 10, 2020. FINDINGS: The heart size and mediastinal contours are within normal limits. Status post coronary bypass graft. No pneumothorax or pleural effusion is noted. Bilateral lung opacities are again noted, right greater than left, concerning for multifocal pneumonia or less likely edema. The visualized skeletal structures are unremarkable. IMPRESSION: Bilateral lung opacities are again noted, right greater than left, concerning for multifocal pneumonia or less likely edema. Electronically Signed   By: Marijo Conception M.D.   On: 09/17/2020 08:15    Scheduled Meds: . apixaban  5 mg Oral BID  . magic mouthwash w/lidocaine  5 mL Oral QID  . mirtazapine  7.5 mg Oral QHS  . sodium chloride flush  3 mL Intravenous Q12H  . tamsulosin  0.4 mg Oral QPC supper   Continuous Infusions: . sodium chloride 250 mL (09/13/20 1714)  . ceFEPime (MAXIPIME) IV Stopped (09/17/20 0610)    Assessment/Plan:  1. Severe sepsis, present on admission with acute hypoxic respiratory failure bilateral aspiration pneumonia and Pseudomonas UTI.  Yesterday was on 5 L of oxygen and today I dialed him down to 2 L with good saturation.  Patient finished antibiotics for aspiration pneumonia.  We will have a few more days of cefepime for Pseudomonas UTI 2. Bilateral aspiration pneumonia.  Patient on dysphagia diet.  Patient did not eat much last night but states he ate some for breakfast.  Speech therapy to reeval today and potentially do a modified barium swallow tomorrow 3. Dysphagia.  Speech therapy reevaluation today and possible modified barium swallow tomorrow.  Ulcerations in his mouth and we put on Magic mouthwash and they look a little bit better today.  For his poor appetite I added Remeron last night 4. Iron deficiency anemia last hemoglobin 8.7 5. Hypernatremia, hypokalemia, hypophosphatemia and  hypomagnesemia.  Improved during the hospital course with replacement 6. Recent hip fracture.  Will need rehab upon disposition from hospital 7. Paroxysmal atrial fibrillation on Eliquis for anticoagulation 8. Hyperlipidemia unspecified.  Crestor on hold 9. History of prostate cancer.  A specialty pharmacy contacted me about his cancer medication today.  I gave the specialty pharmacy the number to his son to discuss the prostate cancer medication and get the name of his prescribing doctor for further information. 10. Stage II decubiti, present on admission.  See description below  Pressure Injury 10/09/18 Stage II -  Partial thickness loss of dermis presenting as a shallow open ulcer with a red, pink wound bed without slough. pt had this wound from home per his report; chronic partial thickness wounds related to shear, not pressur (Active)  10/09/18 0730  Location: Buttocks  Location Orientation: Left;Right  Staging: Stage II -  Partial thickness loss of dermis presenting as a shallow open ulcer with a red, pink wound bed without slough.  Wound Description (Comments): pt had this wound from home per his report; chronic partial thickness wounds related to shear, not pressure  Present on Admission: Yes     Pressure Injury 09/17/20 Buttocks Left Stage 2 -  Partial thickness loss of dermis presenting as a shallow open injury with a red, pink wound bed without slough. epithelialized; some bleeding; margins irregular (Active)  09/17/20 0526  Location: Buttocks  Location Orientation: Left  Staging: Stage 2 -  Partial thickness loss of dermis presenting as a shallow open injury with a red, pink wound bed without slough.  Wound Description (Comments): epithelialized; some bleeding; margins irregular  Present on Admission:        Code Status:     Code Status Orders  (From admission, onward)         Start     Ordered   09/10/20 1927  Do not attempt resuscitation (DNR)  Continuous        Question Answer Comment  In the event of cardiac or respiratory ARREST Do not call a "code blue"   In the event of cardiac or respiratory ARREST Do not perform Intubation, CPR, defibrillation or ACLS   In the event of cardiac or respiratory ARREST Use medication by any route, position, wound care, and other measures to relive pain and suffering. May use oxygen, suction and manual treatment of airway obstruction as needed for comfort.   Comments CODE STATUS was discussed and patient wishes to be placed on a DO NOT RESUSCITATE      09/10/20 1929        Code Status History    Date Active Date Inactive Code Status Order ID Comments User Context   09/02/2020 1018 09/07/2020 2011 DNR [id number]  Collier Bullock, MD ED   09/02/2020 0935 09/02/2020 1017 Full Code [id number]  Collier Bullock, MD ED   12/20/2019 1731 12/23/2019 1827 DNR [id number]  Nita Sells, MD Inpatient   12/19/2019 1343 12/20/2019 1731 Full Code [id number]  Lequita Halt, MD ED   03/08/2019 1132 03/10/2019 1627 Full Code [id number]  Lang Snow, NP ED   03/02/2019 2157 03/04/2019 1836 Full Code [id number]  Jani Gravel, MD ED   10/05/2018 1758 10/12/2018 0206 Full Code [id number]  [place], [place], DO ED   08/05/2018 0007 08/09/2018 1918 Full Code [id number]  Etta Quill, DO ED   01/05/2018 0309 01/06/2018 2143 Full Code [id number]  Rise Patience, MD ED   03/12/2017 1628 03/17/2017 2043 Full Code [id number]  Murlean Iba, MD Inpatient   Advance Care Planning Activity     Family Communication: Spoke with son on the phone Disposition Plan: Status is: Inpatient  Dispo: The patient is from: Rehab  Anticipated d/c is to: Rehab              Anticipated d/c date is: We will have a modified barium swallow tomorrow.  We will have discussion again with family after that on how he is doing.              Patient currently not eating very well and still have trouble swallowing.  Palliative care following.  We will have to  make a plan upon discharge from the hospital   Difficult to place patient.  Hopefully not.  Consultants:  Palliative care  Antibiotics:  Cefepime  Time spent: 27 minutes, case discussed with speech therapy palliative care and nursing staff  [facility]  [facility]

## 2020-09-17 NOTE — Progress Notes (Signed)
Palliative:   [name], [name], is lying quietly in bed. His eyes are closed, but he wakes easily as I enter the room. He will briefly make but not keep eye contact until I asked him to look at me. He is oriented to person and place, but is unsure of the month. He is able to make his basic needs known. There is no family at bedside at this time.  Carl [name] asks for something to drink.  He is on a thickened liquid restriction.  He is able to take and hold his thickened cranberry juice, drinking 90% of it.  There is more thickened clam berry juice nearby, I asked him if he would like it.  He tells me that he does not want any more, but knows that he must drink.  There is a cup of water nearby he tells me he would like some water.  We talked about his restrictions, but we attempt thin liquid water.  He has a cough after drinking.  I share with Carl [name] that this is a sign of aspiration, and we must be safe.  I try to reassure him.  Thin liquids removed from bedside.  He is to have a modified barium swallow.  It remains to be seen, Carl [name]' ability to safely take thin liquids.  It is difficult to maintain hydration on thickened liquids.  Conference with attending, bedside nursing staff, transition of care team, speech therapy, related to patient condition, needs, goals of care.  Plan: Continue to treat the treatable but no CPR or intubation.  Return to [facility] for rehab, with ultimate goal of returning home  40 minutes   Quinn Axe, NP Palliative medicine team Team phone [phone] Greater than 50% of this time was spent counseling and coordinating care related to the above assessment and plan.

## 2020-09-18 ENCOUNTER — Inpatient Hospital Stay: Payer: Medicare Other

## 2020-09-18 ENCOUNTER — Other Ambulatory Visit: Payer: Self-pay | Admitting: Family Medicine

## 2020-09-18 DIAGNOSIS — L89302 Pressure ulcer of unspecified buttock, stage 2: Secondary | ICD-10-CM | POA: Diagnosis not present

## 2020-09-18 DIAGNOSIS — S72032A Displaced midcervical fracture of left femur, initial encounter for closed fracture: Secondary | ICD-10-CM | POA: Diagnosis not present

## 2020-09-18 DIAGNOSIS — M10039 Idiopathic gout, unspecified wrist: Secondary | ICD-10-CM | POA: Diagnosis not present

## 2020-09-18 DIAGNOSIS — B965 Pseudomonas (aeruginosa) (mallei) (pseudomallei) as the cause of diseases classified elsewhere: Secondary | ICD-10-CM | POA: Diagnosis not present

## 2020-09-18 DIAGNOSIS — I10 Essential (primary) hypertension: Secondary | ICD-10-CM | POA: Diagnosis not present

## 2020-09-18 DIAGNOSIS — L89312 Pressure ulcer of right buttock, stage 2: Secondary | ICD-10-CM | POA: Diagnosis not present

## 2020-09-18 DIAGNOSIS — E870 Hyperosmolality and hypernatremia: Secondary | ICD-10-CM | POA: Diagnosis not present

## 2020-09-18 DIAGNOSIS — R6520 Severe sepsis without septic shock: Secondary | ICD-10-CM | POA: Diagnosis not present

## 2020-09-18 DIAGNOSIS — D72829 Elevated white blood cell count, unspecified: Secondary | ICD-10-CM | POA: Diagnosis not present

## 2020-09-18 DIAGNOSIS — Z7189 Other specified counseling: Secondary | ICD-10-CM | POA: Diagnosis not present

## 2020-09-18 DIAGNOSIS — S72002D Fracture of unspecified part of neck of left femur, subsequent encounter for closed fracture with routine healing: Secondary | ICD-10-CM | POA: Diagnosis not present

## 2020-09-18 DIAGNOSIS — I272 Pulmonary hypertension, unspecified: Secondary | ICD-10-CM | POA: Diagnosis not present

## 2020-09-18 DIAGNOSIS — M1388 Other specified arthritis, other site: Secondary | ICD-10-CM | POA: Diagnosis not present

## 2020-09-18 DIAGNOSIS — R2681 Unsteadiness on feet: Secondary | ICD-10-CM | POA: Diagnosis not present

## 2020-09-18 DIAGNOSIS — I480 Paroxysmal atrial fibrillation: Secondary | ICD-10-CM | POA: Diagnosis not present

## 2020-09-18 DIAGNOSIS — I714 Abdominal aortic aneurysm, without rupture: Secondary | ICD-10-CM | POA: Diagnosis not present

## 2020-09-18 DIAGNOSIS — I4811 Longstanding persistent atrial fibrillation: Secondary | ICD-10-CM | POA: Diagnosis not present

## 2020-09-18 DIAGNOSIS — A419 Sepsis, unspecified organism: Secondary | ICD-10-CM | POA: Diagnosis not present

## 2020-09-18 DIAGNOSIS — D500 Iron deficiency anemia secondary to blood loss (chronic): Secondary | ICD-10-CM | POA: Diagnosis not present

## 2020-09-18 DIAGNOSIS — N183 Chronic kidney disease, stage 3 unspecified: Secondary | ICD-10-CM | POA: Diagnosis not present

## 2020-09-18 DIAGNOSIS — R1310 Dysphagia, unspecified: Secondary | ICD-10-CM | POA: Diagnosis not present

## 2020-09-18 DIAGNOSIS — E785 Hyperlipidemia, unspecified: Secondary | ICD-10-CM | POA: Diagnosis not present

## 2020-09-18 DIAGNOSIS — L89152 Pressure ulcer of sacral region, stage 2: Secondary | ICD-10-CM | POA: Diagnosis not present

## 2020-09-18 DIAGNOSIS — R5381 Other malaise: Secondary | ICD-10-CM | POA: Diagnosis not present

## 2020-09-18 DIAGNOSIS — R279 Unspecified lack of coordination: Secondary | ICD-10-CM | POA: Diagnosis not present

## 2020-09-18 DIAGNOSIS — R1319 Other dysphagia: Secondary | ICD-10-CM | POA: Diagnosis not present

## 2020-09-18 DIAGNOSIS — J9601 Acute respiratory failure with hypoxia: Secondary | ICD-10-CM | POA: Diagnosis not present

## 2020-09-18 DIAGNOSIS — R109 Unspecified abdominal pain: Secondary | ICD-10-CM

## 2020-09-18 DIAGNOSIS — I5032 Chronic diastolic (congestive) heart failure: Secondary | ICD-10-CM | POA: Diagnosis not present

## 2020-09-18 DIAGNOSIS — Z9181 History of falling: Secondary | ICD-10-CM | POA: Diagnosis not present

## 2020-09-18 DIAGNOSIS — R1312 Dysphagia, oropharyngeal phase: Secondary | ICD-10-CM | POA: Diagnosis not present

## 2020-09-18 DIAGNOSIS — N390 Urinary tract infection, site not specified: Secondary | ICD-10-CM | POA: Diagnosis not present

## 2020-09-18 DIAGNOSIS — E876 Hypokalemia: Secondary | ICD-10-CM | POA: Diagnosis not present

## 2020-09-18 DIAGNOSIS — S7292XD Unspecified fracture of left femur, subsequent encounter for closed fracture with routine healing: Secondary | ICD-10-CM | POA: Diagnosis not present

## 2020-09-18 DIAGNOSIS — M6281 Muscle weakness (generalized): Secondary | ICD-10-CM | POA: Diagnosis not present

## 2020-09-18 DIAGNOSIS — L0390 Cellulitis, unspecified: Secondary | ICD-10-CM | POA: Diagnosis not present

## 2020-09-18 DIAGNOSIS — J690 Pneumonitis due to inhalation of food and vomit: Secondary | ICD-10-CM | POA: Diagnosis not present

## 2020-09-18 DIAGNOSIS — K5900 Constipation, unspecified: Secondary | ICD-10-CM | POA: Diagnosis not present

## 2020-09-18 DIAGNOSIS — R2689 Other abnormalities of gait and mobility: Secondary | ICD-10-CM | POA: Diagnosis not present

## 2020-09-18 DIAGNOSIS — Z515 Encounter for palliative care: Secondary | ICD-10-CM | POA: Diagnosis not present

## 2020-09-18 DIAGNOSIS — Z736 Limitation of activities due to disability: Secondary | ICD-10-CM | POA: Diagnosis not present

## 2020-09-18 DIAGNOSIS — R531 Weakness: Secondary | ICD-10-CM | POA: Diagnosis not present

## 2020-09-18 DIAGNOSIS — K219 Gastro-esophageal reflux disease without esophagitis: Secondary | ICD-10-CM | POA: Diagnosis not present

## 2020-09-18 DIAGNOSIS — L89322 Pressure ulcer of left buttock, stage 2: Secondary | ICD-10-CM | POA: Diagnosis not present

## 2020-09-18 DIAGNOSIS — E43 Unspecified severe protein-calorie malnutrition: Secondary | ICD-10-CM | POA: Diagnosis not present

## 2020-09-18 DIAGNOSIS — R488 Other symbolic dysfunctions: Secondary | ICD-10-CM | POA: Diagnosis not present

## 2020-09-18 LAB — CBC
HCT: 27.6 % — ABNORMAL LOW (ref 39.0–52.0)
Hemoglobin: 8.8 g/dL — ABNORMAL LOW (ref 13.0–17.0)
MCH: 31.7 pg (ref 26.0–34.0)
MCHC: 31.9 g/dL (ref 30.0–36.0)
MCV: 99.3 fL (ref 80.0–100.0)
Platelets: 335 10*3/uL (ref 150–400)
RBC: 2.78 MIL/uL — ABNORMAL LOW (ref 4.22–5.81)
RDW: 19.7 % — ABNORMAL HIGH (ref 11.5–15.5)
WBC: 7 10*3/uL (ref 4.0–10.5)
nRBC: 0 % (ref 0.0–0.2)

## 2020-09-18 LAB — COMPREHENSIVE METABOLIC PANEL
ALT: 16 U/L (ref 0–44)
AST: 16 U/L (ref 15–41)
Albumin: 2 g/dL — ABNORMAL LOW (ref 3.5–5.0)
Alkaline Phosphatase: 69 U/L (ref 38–126)
Anion gap: 4 — ABNORMAL LOW (ref 5–15)
BUN: 13 mg/dL (ref 8–23)
CO2: 20 mmol/L — ABNORMAL LOW (ref 22–32)
Calcium: 7.8 mg/dL — ABNORMAL LOW (ref 8.9–10.3)
Chloride: 118 mmol/L — ABNORMAL HIGH (ref 98–111)
Creatinine, Ser: 0.76 mg/dL (ref 0.61–1.24)
GFR, Estimated: >60 mL/min (ref >60)
Glucose, Bld: 104 mg/dL — ABNORMAL HIGH (ref 70–99)
Potassium: 3.8 mmol/L (ref 3.5–5.1)
Sodium: 142 mmol/L (ref 135–145)
Total Bilirubin: 0.9 mg/dL (ref 0.3–1.2)
Total Protein: 4.8 g/dL — ABNORMAL LOW (ref 6.5–8.1)

## 2020-09-18 LAB — MAGNESIUM: Magnesium: 1.6 mg/dL — ABNORMAL LOW (ref 1.7–2.4)

## 2020-09-18 LAB — RESP PANEL BY RT-PCR (FLU A&B, COVID) ARPGX2
Influenza A by PCR: NEGATIVE
Influenza B by PCR: NEGATIVE
SARS Coronavirus 2 by RT PCR: NEGATIVE

## 2020-09-18 LAB — PHOSPHORUS: Phosphorus: 2.1 mg/dL — ABNORMAL LOW (ref 2.5–4.6)

## 2020-09-18 MED ORDER — TRAMADOL HCL 50 MG PO TABS: 50.0000 mg | ORAL_TABLET | Freq: Four times a day (QID) | ORAL | 0 refills | Status: DC | PRN

## 2020-09-18 MED ORDER — ACETAMINOPHEN 325 MG PO TABS: 650.0000 mg | ORAL_TABLET | Freq: Four times a day (QID) | ORAL | Status: DC | PRN

## 2020-09-18 MED ORDER — MAGIC MOUTHWASH W/LIDOCAINE: 5.0000 mL | Freq: Four times a day (QID) | ORAL | 0 refills | Status: AC

## 2020-09-18 MED ORDER — MAGNESIUM SULFATE 2 GM/50ML IV SOLN
2.0000 g | Freq: Once | INTRAVENOUS | Status: AC
  Administered 2020-09-18: 2 g via INTRAVENOUS
  Filled 2020-09-18: qty 50

## 2020-09-18 MED ORDER — MIRTAZAPINE 7.5 MG PO TABS: 7.5000 mg | ORAL_TABLET | Freq: Every day | ORAL | 0 refills | Status: DC

## 2020-09-18 MED ORDER — POTASSIUM & SODIUM PHOSPHATES 280-160-250 MG PO PACK: 1.0000 | PACK | Freq: Three times a day (TID) | ORAL | 0 refills | Status: AC

## 2020-09-18 MED ORDER — POTASSIUM & SODIUM PHOSPHATES 280-160-250 MG PO PACK
1.0000 | PACK | Freq: Three times a day (TID) | ORAL | Status: AC
  Administered 2020-09-18 (×3): 1 via ORAL
  Filled 2020-09-18 (×5): qty 1

## 2020-09-18 NOTE — TOC Progression Note (Signed)
Transition of Care [facility]) - Progression Note    Patient Details  Name: Carl [name] MRN: [id number] Date of Birth: 15-Jun-1936  Transition of Care [facility] CM/SW Contact  Shelbie Hutching, RN Phone Number: 09/18/2020, 11:11 AM  Clinical Narrative:    Insurance authorization for SNF started through [employer].  FL2 updated to reflect current speech recommendations.     Expected Discharge Plan: [place] Barriers to Discharge: Continued Medical Work up  Expected Discharge Plan and Services Expected Discharge Plan: [place]   Discharge Planning Services: CM Consult Post Acute Care Choice: [place] Living arrangements for the past 2 months: Single Family Home                 DME Arranged: N/A DME Agency: NA       HH Arranged: NA           Social Determinants of Health (SDOH) Interventions    Readmission Risk Interventions Readmission Risk Prevention Plan 09/04/2020  Transportation Screening Complete  Social Work Consult for [place] Planning/Counseling Complete  Medication Review Press photographer) Complete  Some recent data might be hidden

## 2020-09-18 NOTE — Progress Notes (Signed)
Occupational Therapy Treatment Patient Details Name: Carl [name] MRN: [id number] DOB: 1935/12/04 Today's Date: 09/18/2020    History of present illness Pt is an 85 y/o M with PMH: pAFib on Eliquis, dCHF, AAA, stage III CKD, CAD and recent adm to [facility] with fall to L side and was s/p L hemiarthroplasty with Dr. Sabra Heck. Pt re-presented from [facility] where he went after hip sx, d/t SOB. Pt found to have b/l aspiration PNA on imaging and adm with sepsis.   OT comments  Pt received in bed, awake but lethargic, endorsing 8/10 pain in L LE and declining EOB and OOB activity. Pt able to engage in LE AROM/AAROM exercises, requiring increased time, verbal cues, and frequent encouragement to continue with activity. Two coughing episodes during session. Pt requires Max A for bed mobility. Therapist able to assist pt in repositioning to increase comfort. Nurse notified of pt's pain. D/C to SNF remains appropriate.    Follow Up Recommendations  SNF    Equipment Recommendations       Recommendations for Other Services      Precautions / Restrictions Precautions Precautions: Posterior Hip;Fall Precaution Comments: hip abduction pillow; aspiration Restrictions Weight Bearing Restrictions: Yes LLE Weight Bearing: Partial weight bearing       Mobility Bed Mobility Overal bed mobility: Needs Assistance Bed Mobility: Rolling;Supine to Sit;Sit to Supine Rolling: Max assist   Supine to sit: Max assist Sit to supine: Max assist        Transfers Overall transfer level: Needs assistance               General transfer comment: unable to attempt    Balance Overall balance assessment: Needs assistance Sitting-balance support: Single extremity supported;Feet supported Sitting balance-Leahy Scale: Poor         Standing balance comment: unable to attempt                           ADL either performed or assessed with clinical judgement   ADL                                          General ADL Comments: pt requires SETUP to MIN A For seated UB ADLs including oral care, MAX A for LB ADLs.     Vision Patient Visual Report: No change from baseline     Perception     Praxis      Cognition Arousal/Alertness: Lethargic Behavior During Therapy: WFL for tasks assessed/performed Overall Cognitive Status: Within Functional Limits for tasks assessed                         Following Commands: Follows one step commands with increased time     Problem Solving: Slow processing;Decreased initiation;Requires verbal cues;Requires tactile cues General Comments: Oriented to self, location, situation        Exercises Total Joint Exercises Ankle Circles/Pumps: AROM;10 reps;Both;Supine Heel Slides: AROM;Both;5 reps;Supine Hip ABduction/ADduction: AAROM;Both;5 reps;Supine Straight Leg Raises: AAROM;Both;5 reps;Supine Knee Flexion: PROM;Both;5 reps;Supine   Shoulder Instructions       General Comments      Pertinent Vitals/ Pain       Pain Assessment: 0-10 Pain Score: 8  Pain Location: L hip/thigh Pain Descriptors / Indicators: Grimacing;Operative site guarding;Tender;Sore Pain Intervention(s): Limited activity within patient's tolerance;Patient requesting pain meds-RN notified;Monitored during  session;Repositioned  Home Living                                          Prior Functioning/Environment              Frequency  Min 1X/week        Progress Toward Goals  OT Goals(current goals can now be found in the care plan section)  Progress towards OT goals: OT to reassess next treatment  Acute Rehab OT Goals Patient Stated Goal: get stronger OT Goal Formulation: With patient Time For Goal Achievement: 09/28/20 Potential to Achieve Goals: [place] Discharge plan remains appropriate;Frequency needs to be updated    Co-evaluation                 AM-PAC OT "6 Clicks" Daily  Activity     Outcome Measure   Help from another person eating meals?: A Little Help from another person taking care of personal grooming?: A Little Help from another person toileting, which includes using toliet, bedpan, or urinal?: A Lot Help from another person bathing (including washing, rinsing, drying)?: A Lot Help from another person to put on and taking off regular upper body clothing?: A Lot Help from another person to put on and taking off regular lower body clothing?: A Lot 6 Click Score: 14    End of Session    OT Visit Diagnosis: Unsteadiness on feet (R26.81);Muscle weakness (generalized) (M62.81)   Activity Tolerance Patient limited by fatigue;Patient limited by pain   Patient Left in bed;with call bell/phone within reach;with bed alarm set   Nurse Communication Patient requests pain meds        Time: 7681-1572 OT Time Calculation (min): 24 min  Charges: OT General Charges $OT Visit: 1 Visit OT Treatments $Self Care/Home Management : 23-37 mins  Josiah Lobo, PhD, [place], OTR/L ascom [phone] 09/18/20, 11:40 AM

## 2020-09-18 NOTE — NC FL2 (Signed)
[place] LEVEL OF CARE SCREENING TOOL     IDENTIFICATION  Patient Name: Carl [name] Birthdate: 24-Aug-1935 Sex: male Admission Date (Current Location): 09/10/2020  [place] and [place] Number:  Engineering geologist and Address:  [facility], [address], [place], [place] [address]      Provider Number: [id number]  Attending Physician Name and Address:  Loletha Grayer, MD  Relative Name and Phone Number:  Gwyndolyn Saxon (son)    Current Level of Care: Hospital Recommended Level of Care: [place] Prior Approval Number:    Date Approved/Denied:   PASRR Number:    Discharge Plan: SNF    Current Diagnoses: Patient Active Problem List   Diagnosis Date Noted  . Pressure injury of skin 09/17/2020  . Aspiration pneumonia of both lower lobes due to gastric secretions ([place])   . Dysphagia   . Hypernatremia   . Hypokalemia   . Hypophosphatasia   . Severe sepsis ([place]) 09/10/2020  . Acute respiratory failure with hypoxia ([place]) 09/10/2020  . Closed left hip fracture ([place]) 09/02/2020  . Transaminitis 09/02/2020  . AF (paroxysmal atrial fibrillation) ([place]) 09/02/2020  . History of UTI 02/14/2020  . Constipation 02/14/2020  . Iron deficiency anemia 01/17/2020  . Near syncope   . Protein-calorie malnutrition, severe 12/22/2019  . Fall 12/19/2019  . Weight loss 08/30/2019  . Onychomycosis 08/30/2019  . AAA (abdominal aortic aneurysm) ([place]) 08/26/2018  . Pseudomonas urinary tract infection 08/05/2018  . Kidney stones 06/30/2018  . Allergic rhinitis 04/26/2018  . Urolithiasis 01/05/2018  . Frequent urination 01/01/2018  . Colon polyps 09/15/2017  . GERD (gastroesophageal reflux disease) 09/15/2017  . Wrist pain, acute, right 05/25/2017  . Low back pain 03/24/2017  . Lung nodule seen on imaging study: 7 mm nodule left midlung 03/13/2017  . Physical deconditioning 03/13/2017  . Normocytic anemia 03/13/2017  . Fall at  home, initial encounter 03/12/2017  . Abdominal soreness 03/12/2017  . Generalized weakness 03/12/2017  . Leukocytosis 03/12/2017  . Hyponatremia 03/12/2017  . Orthostatic hypotension 02/16/2017  . Anticoagulated 09/19/2016  . Prostate CA ([place]) 09/04/2016  . Longstanding persistent atrial fibrillation ([place]) 09/04/2016  . Cardiomyopathy, ischemic 07/16/2012  . Chronic kidney disease (CKD), stage III (moderate) ([place]) 07/16/2012  . Essential hypertension 08/05/2011  . Murmur 08/05/2011  . 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 Dyslipidemia 02/04/2011    Orientation RESPIRATION BLADDER Height & Weight     Self,Place  O2 ([place] 4L) Incontinent,External catheter Weight: 72.4 kg Height:  6\' 3"  (190.5 cm)  BEHAVIORAL SYMPTOMS/MOOD NEUROLOGICAL BOWEL NUTRITION STATUS      Continent Diet (Dysphagia 3 (Mech soft) solids;Nectar thick liquid  Liquid Administration via Cup;No straw  Medication Administration Whole meds with puree Slow rate;Small sips/bite;Follow solids with liquid;Seated upright at 90 degrees)  AMBULATORY STATUS COMMUNICATION OF NEEDS Skin   Extensive Assist Verbally Surgical wounds (Left hip incision- skin tears to left elbow and hand)                       Personal Care Assistance Level of Assistance  Bathing,Feeding,Dressing Bathing Assistance: Maximum assistance Feeding assistance: Maximum assistance Dressing Assistance: Maximum assistance     Functional Limitations Info             SPECIAL CARE FACTORS FREQUENCY  PT (By licensed PT),OT (By licensed OT),Speech therapy     PT Frequency: 5 times per week OT Frequency: 5 times per week  Speech Therapy Frequency: 2-3 times per week see Speech notes from hospital      Contractures Contractures Info: Not present    Additional Factors Info  Code Status,Allergies Code Status Info: DNR Allergies Info: Ciprofloxacin, ibuprofen           Current Medications (09/18/2020):  This is the current hospital active  medication list Current Facility-Administered Medications  Medication Dose Route Frequency Provider Last Rate Last Admin  . 0.9 %  sodium chloride infusion   Intravenous PRN Allie Bossier, MD 10 mL/hr at 09/18/20 9030 Infusion Verify at 09/18/20 0923  . acetaminophen (TYLENOL) tablet 650 mg  650 mg Oral Q6H PRN Marcelyn Bruins, MD       Or  . acetaminophen (TYLENOL) suppository 650 mg  650 mg Rectal Q6H PRN Marcelyn Bruins, MD      . apixaban Arne Cleveland) tablet 5 mg  5 mg Oral BID Loletha Grayer, MD   5 mg at 09/18/20 0957  . ceFEPIme (MAXIPIME) 2 g in sodium chloride 0.9 % 100 mL IVPB  2 g Intravenous Q8H Loletha Grayer, MD   Stopped at 09/18/20 [phone]  . magic mouthwash w/lidocaine  5 mL Oral QID Loletha Grayer, MD   5 mL at 09/18/20 0957  . mirtazapine (REMERON) tablet 7.5 mg  7.5 mg Oral QHS Loletha Grayer, MD   7.5 mg at 09/17/20 2217  . morphine 2 MG/ML injection 0.5 mg  0.5 mg Intravenous Q4H PRN Fritzi Mandes, MD   0.5 mg at 09/17/20 0158  . potassium & sodium phosphates (PHOS-NAK) 280-160-250 MG packet 1 packet  1 packet Oral TID WC & HS Loletha Grayer, MD   1 packet at 09/18/20 0957  . sodium chloride flush (NS) 0.9 % injection 3 mL  3 mL Intravenous Q12H Marcelyn Bruins, MD   3 mL at 09/17/20 1011  . tamsulosin (FLOMAX) capsule 0.4 mg  0.4 mg Oral QPC supper Allie Bossier, MD   0.4 mg at 09/16/20 1739  . traMADol (ULTRAM) tablet 50 mg  50 mg Oral Q6H PRN Marcelyn Bruins, MD   50 mg at 09/16/20 2232     Discharge Medications: Please see discharge summary for a list of discharge medications.  Relevant Imaging Results:  Relevant Lab Results:   Additional Information [id number]   aspiration precautions  Shelbie Hutching, RN

## 2020-09-18 NOTE — Evaluation (Signed)
Objective Swallowing Evaluation: Type of Study: MBS-Modified Barium Swallow Study   Patient Details  Name: Carl [name] MRN: [id number] Date of Birth: 11-23-1935  Today's Date: 09/18/2020 Time: SLP Start Time (ACUTE ONLY): 1308 -SLP Stop Time (ACUTE ONLY): 6578  SLP Time Calculation (min) (ACUTE ONLY): 60 min   Past Medical History:  Past Medical History:  Diagnosis Date  . (HFpEF) heart failure with preserved ejection fraction ([place])    a. 08/2016 Echo: EF 50-55%.  [name] AAA (abdominal aortic aneurysm) ([place]) 06/05/2019   abdominal aortic aneurysm measuring 3.1 cm.  . Arthritis    arms  . Bladder calculus   . Chronic anemia   . Chronic anxiety   . CKD (chronic kidney disease), stage III ([place])   . Coronary artery disease CARDIOLOGIST-  DR Rockey Situ   a. CABG 2006 with  LIMA-LAD, SVG-Ramus, SVG-OM, seq SVG-PLV-PDA.  . DOE (dyspnea on exertion)   . Frequent falls 11/2019  . GERD (gastroesophageal reflux disease)   . Heart murmur   . Hematuria   . Hip fracture ([place]) 10/05/2018  . History of bladder stone   . History of colon polyps   . History of non-ST elevation myocardial infarction (NSTEMI) 03/30/2005   post op recovery inguinal hernia repair  . History of pressure ulcer   . History of recurrent UTIs    last admission 08-04-2018 pseudomonas UTI  . Hyperlipidemia   . Hyperplasia of prostate without lower urinary tract symptoms (LUTS)   . Hypertension   . Indwelling Foley catheter present   . Intracranial hematoma ([place]) 10/05/2018  . Ischemic cardiomyopathy    a. echo (01/13) 45-50% septal akinesis, mild MR; b. Echo  (01/16) ef 60-65%, mid-apical anteroseptal hypokinesis; c. echo 09-16-2016 ef 50-55%  . Left ureteral stone   . Lung nodule    noted 08/ 2018;  followed by pcp  . LVH (left ventricular hypertrophy) 09/16/2016   Moderate noted on ECHO  . Moderate mitral regurgitation    a.08/2016 Echo: Mod MR/TR.  [name] Orthostatic hypotension   . PAC (premature atrial contraction)    . Paroxysmal atrial flutter ([place])    a. dx 08/2016, started on amiodarone.  . Persistent atrial fibrillation [facility]) followed by cardiologist-- dr Rockey Situ   a. dx AFib 08/2016 s/p DCCV 10/2016-->eliquis (CHA2DS2VASc = 5).  . Pneumonia   . Pulmonary hypertension ([place])    last echo in epic 09-16-2016  . Recurrent prostate cancer ([place]) UROLOGIST-- DR BELL   s/p  radioactive prostate seed implants in 1997;    due to PSA rising pt has been doing lupron injection's few past several yrs at dr bell office  . Renal calculi    bilateral per CT 06-18-2018  . S/P CABG x 5 04/04/2005   LIMA to LAD,  SVG to Ramus,  SVG to OM,  seqSVG to PLV and PDA  . Wears glasses    Past Surgical History:  Past Surgical History:  Procedure Laterality Date  . CARDIAC CATHETERIZATION  04/02/2005   dr Doreatha Lew    Critical three-vessel coronary disease --  Essentially normal left ventricular function , ef 55%  . CARDIOVASCULAR STRESS TEST  05-01-2008  dr Doreatha Lew /  dr harding   Low risk nuclear study w/ no ischemia or scar but flattened septal motion/  ef 64%  . CARDIOVERSION N/A 11/19/2016   Procedure: CARDIOVERSION;  Surgeon: Jerline Pain, MD;  Location: [facility];  Service: Cardiovascular;  Laterality: N/A;  . COLONOSCOPY N/A 10/23/2016   Procedure: COLONOSCOPY;  Surgeon: Milus Banister, MD;  [facility];  Service: Endoscopy;  Laterality: N/A;  . CORONARY ARTERY BYPASS GRAFT  04/04/2005   dr  Ricard Dillon   LIMA - LAD,  SVG - Ramus, SVG - OM,  seqSVG - PLV and PDA  . CYSTOSCOPY WITH LITHOLAPAXY N/A 05/07/2016   Procedure: CYSTOSCOPY WITH LITHOLAPAXY;  Surgeon: Rana Snare, MD;  Location: [facility];  Service: Urology;  Laterality: N/A;  . CYSTOSCOPY WITH RETROGRADE PYELOGRAM, URETEROSCOPY AND STENT PLACEMENT  09/03/2018   Procedure: CYSTOSCOPY WITH RETROGRADE PYELOGRAM, URETEROSCOPY LASER LITHOTRIPSY AND STENT PLACEMENT;  Surgeon: Lucas Mallow, MD;  Location: [facility];  Service: Urology;;   . CYSTOSCOPY/URETEROSCOPY/HOLMIUM LASER/STENT PLACEMENT Right 06/15/2019   Procedure: CYSTOSCOPY RIGHT URETEROSCOPY HOLMIUM LASER STENT PLACEMENT;  Surgeon: Lucas Mallow, MD;  Location: [facility];  Service: Urology;  Laterality: Right;  . HIP ARTHROPLASTY Left 09/03/2020   Procedure: ARTHROPLASTY BIPOLAR HIP (HEMIARTHROPLASTY);  Surgeon: Earnestine Leys, MD;  Location: [facility];  Service: Orthopedics;  Laterality: Left;  . HOLMIUM LASER APPLICATION N/A 03/10/4817   Procedure: HOLMIUM LASER APPLICATION;  Surgeon: Rana Snare, MD;  Location: [facility];  Service: Urology;  Laterality: N/A;  . INGUINAL HERNIA REPAIR Left 03-30-2005  dr Excell Seltzer   incarcerated   . RADIOACTIVE PROSTATE SEED IMPLANTS  1997  . TOTAL HIP ARTHROPLASTY Right 10/07/2018   Procedure: TOTAL HIP ARTHROPLASTY ANTERIOR APPROACH;  Surgeon: Rod Can, MD;  Location: [facility];  Service: Orthopedics;  Laterality: Right;  . TRANSTHORACIC ECHOCARDIOGRAM  08/24/2014   mid anteroseptal and distal septak hypokinetic,  mild focal basal LVH, ef 60-65%,  grade 1 diastolic dysfunction/  mild MR/  mild LAE/  mild dilated RV with normal RVSP/  trivial TR   HPI: [name] with medical history significant for paroxysmal atrial fibrillation on anticoagulation therapy with Eliquis, chronic diastolic dysfunction CHF, abdominal aortic aneurysm, stage III chronic kidney disease and coronary artery disease.  He is currently S/P L hemiarthroplasty and receiving Rehab at facility post d/c from hospital last week.  Unsure of pt's Baseline Cognitive status prior to fall and hip surgery.  Pt was being followed by [facility] w/ Malnutrition, per chart notes.  He presents to the emergency department for shortness of breath from his rehab facility.   According to EMS report patient is currently at [facility] rehab after his left hip replacement.  Patient was noted to be short of breath by staff  today and have a room air saturation of 77% and febrile to 101.4.  Upon arrival patient does appear very fatigued and weak however he is able to answer basic questions and is oriented.  States some pain in the left hip but denies any other pain.  Patient is short of breath.  There was concern he might be developing a UTI at the facility. Chest CT: "Multifocal airspace disease consistent with pneumonia, head  suspicious for aspiration. Dense airspace consolidation in the  dependent right upper, middle and lower lobes. Dense but lesser  consolidation in the dependent left lower lobe. Right bronchus  intermedius is completely collapsed or occluded, motion obscures  detailed assessment. Right lower lobe bronchus appears filled.  Additional patchy tree in bud and ground-glass opacities in the more  anterior right lung.".   Subjective: pt awake, verbal w/ min confusion. Redirection when necessary. Impulsive when drinking thin liquids.    Assessment / Plan / Recommendation  CHL IP CLINICAL  IMPRESSIONS 09/18/2020  Clinical Impression Pt appears to present w/ Mod-Severe Oropharyngeal phase Dysphagia w/ SILENT laryngeal Penetration and Aspiration occurring during this evaluation today. The Aspiration Significantly increases risk for negative sequelae and Pulmonary decline, including Aspiration Pnuemonia.   During the Pharyngeal phase, pt exhibited a Delayed pharyngeal swallowing initiation w/ Thin liquids resulting in laryngeal Penetration and Aspiration of Thin liquids BEFORE the swallow. Thin liquids appeared to fill the Pyriform Sinuses before the pharyngeal swallow initiated. Pt also exhibited uncoordinated pharyngeal swallows; multiple, reduced effort swallows w/ Decreased, tight airway closure during the swallows allowing for laryngeal Penetration of Thin liquids DURING the swallow. Noted pharyngeal residue after the swallow w/ trace-min laryngeal Penetration of the residue AFTER the swallow -- noted build-up  of the Penetration seeping below the Vocal Cords becoming Aspiration in between swallows. The pharyngeal residue issue(BOT, posterior pharyngeal wall, Valleculae, Pyriform Sinuses) was also present for the other consistencies given: Nectar liquids, purees, and softened solids indicating decreased pharyngeal pressure, BOT strength, and laryngeal excursion during the swallow. Pt was instructed on using a Chin Tuck during the swallowing; f/u, DRY swallows after in attempts to reduce/clear the pharyngeal residue. Of note, trials of Nectar consistency liquids appeared to provide a more Timely pharyngeal swallow = offered increased sensation and weight w/ its viscosity resulting in swallow triggering at/around the Valleculae w/ a more complete swallow. A 1x occurence of deep Penetration of residue from Nectar liquids AFTER the swallow was noted but appeared to clear w/ completion of the DRY swallow.    Of MOST Concern is the fact that pt is SILENT in much of his laryngeal Penetration and Aspiration until much AFTER the occurrences -- a delayed, congested cough occurred intermittently. This can be deceiving to Caregivers who may suspect pt is tolerating po's "fine" b/c overt s/s are not immediately noted.    To continue w/ an oral diet, recommendation would be for the Dysphagia level 3(mech soft w/ gravies to moisten) w/ NECTAR consistency liquids VIA CUP. Supervision at meals for follow through w/ precautions, strategies. Strict Aspiration Precautions including strategies of f/u, DRY swallow and intermittent, strong Coughs b/t po's and at end of meals. Also, using a min Chin Tuck during swallowing and effortful swallows could aid in reducing pharyngeal residue.   SLP Visit Diagnosis Dysphagia, oropharyngeal phase (R13.12)  Attention and concentration deficit following --  Frontal lobe and executive function deficit following --  Impact on safety and function Moderate aspiration risk;Risk for inadequate  nutrition/hydration      CHL IP TREATMENT RECOMMENDATION 09/18/2020  Treatment Recommendations Defer treatment plan to f/u with SLP     Prognosis 09/18/2020  Prognosis for Safe Diet Advancement Guarded  Barriers to Reach Goals Cognitive deficits;Time post onset;Severity of deficits;Behavior  Barriers/Prognosis Comment baseline Anorexia    CHL IP DIET RECOMMENDATION 09/18/2020  SLP Diet Recommendations Dysphagia 3 (Mech soft) solids;Nectar thick liquid  Liquid Administration via Cup;No straw  Medication Administration Whole meds with puree  Compensations Minimize environmental distractions;Slow rate;Small sips/bites;Lingual sweep for clearance of pocketing;Multiple dry swallows after each bite/sip;Follow solids with liquid;Effortful swallow;Hard cough after swallow;Chin tuck  Postural Changes Remain semi-upright after after feeds/meals (Comment);Seated upright at 90 degrees      CHL IP OTHER RECOMMENDATIONS 09/18/2020  Recommended Consults (No Data)  Oral Care Recommendations Oral care BID;Oral care before and after PO;Staff/trained caregiver to provide oral care  Other Recommendations Order thickener from pharmacy;Prohibited food (jello, ice cream, thin soups);Remove water pitcher;Have oral suction available  CHL IP FOLLOW UP RECOMMENDATIONS 09/18/2020  Follow up Recommendations Skilled Nursing facility      [facility] IP FREQUENCY AND DURATION 09/18/2020  Speech Therapy Frequency (ACUTE ONLY) min 2x/week  Treatment Duration 1 week           CHL IP ORAL PHASE 09/18/2020  Oral Phase Impaired  Oral - Pudding Teaspoon --  Oral - Pudding Cup --  Oral - Honey Teaspoon --  Oral - Honey Cup NT  Oral - Nectar Teaspoon --  Oral - Nectar Cup 3 trials  Oral - Nectar Straw --  Oral - Thin Teaspoon 2 trials  Oral - Thin Cup 8 trials  Oral - Thin Straw --  Oral - Puree 2 trials  Oral - Mech Soft 1 trial  Oral - Regular --  Oral - Multi-Consistency --  Oral - Pill --  Oral Phase -  Comment --    CHL IP PHARYNGEAL PHASE 09/18/2020  Pharyngeal Phase Impaired  Pharyngeal- Pudding Teaspoon --  Pharyngeal --  Pharyngeal- Pudding Cup --  Pharyngeal --  Pharyngeal- Honey Teaspoon --  Pharyngeal --  Pharyngeal- Honey Cup NT  Pharyngeal --  Pharyngeal- Nectar Teaspoon --  Pharyngeal --  Pharyngeal- Nectar Cup 3 trials  Pharyngeal --  Pharyngeal- Nectar Straw --  Pharyngeal --  Pharyngeal- Thin Teaspoon 2 trials  Pharyngeal --  Pharyngeal- Thin Cup 8+ trials  Pharyngeal --  Pharyngeal- Thin Straw --  Pharyngeal --  Pharyngeal- Puree 2 trials  Pharyngeal --  Pharyngeal- Mechanical Soft 1 trial  Pharyngeal --  Pharyngeal- Regular --  Pharyngeal --  Pharyngeal- Multi-consistency --  Pharyngeal --  Pharyngeal- Pill --  Pharyngeal --  Pharyngeal Comment --     CHL IP CERVICAL ESOPHAGEAL PHASE 09/18/2020  Cervical Esophageal Phase WFL  Pudding Teaspoon --  Pudding Cup --  Honey Teaspoon --  Honey Cup --  Nectar Teaspoon --  Nectar Cup --  Nectar Straw --  Thin Teaspoon --  Thin Cup --  Thin Straw --  Puree --  Mechanical Soft --  Regular --  Multi-consistency --  Pill --  Cervical Esophageal Comment --          Orinda Kenner, MS, CCC-SLP Speech Language Pathologist Rehab Services [phone] [facility] 09/18/2020, 10:41 AM

## 2020-09-18 NOTE — Progress Notes (Signed)
Called and gave report to RN  Luretha Murphy at [facility] patient going to room E9-1

## 2020-09-18 NOTE — TOC Progression Note (Signed)
Transition of Care [facility]) - Progression Note    Patient Details  Name: Carl [name] MRN: [id number] Date of Birth: [birth year]  [facility]) CM/SW Contact  Shelbie Hutching, RN Phone Number: 09/18/2020, 3:10 PM  Clinical Narrative:     Transport arranged with [facility], patient will be ready after 4pm.   Expected Discharge Plan: [place] Barriers to Discharge: No Barriers Identified  Expected Discharge Plan and Services Expected Discharge Plan: [place]   Discharge Planning Services: CM Consult Post Acute Care Choice: [place] Living arrangements for the past 2 months: Single Family Home Expected Discharge Date: 09/18/20               DME Arranged: N/A DME Agency: NA       HH Arranged: NA           Social Determinants of Health (SDOH) Interventions    Readmission Risk Interventions Readmission Risk Prevention Plan 09/04/2020  Transportation Screening Complete  Social Work Consult for [place] Planning/Counseling Complete  Medication Review Press photographer) Complete  Some recent data might be hidden

## 2020-09-18 NOTE — TOC Transition Note (Signed)
Transition of Care [facility]) - CM/SW Discharge Note   Patient Details  Name: Carl [name] MRN: [id number] Date of Birth: 02/18/1936  Transition of Care [facility]) CM/SW Contact:  Shelbie Hutching, RN Phone Number: 09/18/2020, 2:03 PM   Clinical Narrative:    Patient medically cleared for discharge to [facility] in [place].  [place] ID [id number], reference # [id number]- good from today through 2/24.  Repeat COVID test is pending, once COVID comes back negative RN can call report to [phone].  Patient will be going to room E9.  RNCM will arrange transport.    Final next level of care: Skilled Nursing Facility Barriers to Discharge: No Barriers Identified   Patient Goals and CMS Choice Patient states their goals for this hospitalization and ongoing recovery are:: Family and patient would like to get back to short term rehab and then long term goal to get back home CMS Medicare.gov Compare Post Acute Care list provided to:: Patient Represenative (must comment) Choice offered to / list presented to : Adult Children  Discharge Placement              Patient chooses bed at: [facility] Patient to be transferred to facility by: [facility] Name of family member notified: Juanda Crumble Patient and family notified of of transfer: 09/18/20  Discharge Plan and Services   Discharge Planning Services: CM Consult Post Acute Care Choice: [place]          DME Arranged: N/A DME Agency: NA       HH Arranged: NA          Social Determinants of Health (SDOH) Interventions     Readmission Risk Interventions Readmission Risk Prevention Plan 09/04/2020  Transportation Screening Complete  Social Work Consult for [place] Planning/Counseling Complete  Medication Review Press photographer) Complete  Some recent data might be hidden

## 2020-09-18 NOTE — Discharge Summary (Signed)
[facility] NAME: Carl [name]    MR#:  [id number]  DATE OF BIRTH:  24-Jul-1936  DATE OF ADMISSION:  09/10/2020 ADMITTING PHYSICIAN: Marcelyn Bruins, MD  DATE OF DISCHARGE: 09/18/2020  PRIMARY CARE PHYSICIAN: [name], MD    ADMISSION DIAGNOSIS:  Severe sepsis ([place]) [A41.9, R65.20]  DISCHARGE DIAGNOSIS:  Principal Problem:   Severe sepsis ([place]) Active Problems:   Hx of CABG x 5 '06   Dyslipidemia   Essential hypertension   Chronic kidney disease (CKD), stage III (moderate) (HCC)   Prostate CA (HCC)   Longstanding persistent atrial fibrillation (HCC)   Generalized weakness   Physical deconditioning   Low back pain   GERD (gastroesophageal reflux disease)   Pseudomonas urinary tract infection   Iron deficiency anemia   History of UTI   Closed left hip fracture (HCC)   AF (paroxysmal atrial fibrillation) (HCC)   Acute respiratory failure with hypoxia (HCC)   Aspiration pneumonia of both lower lobes due to gastric secretions (HCC)   Dysphagia   Hypernatremia   Hypokalemia   Hypophosphatasia   Pressure injury of skin   SECONDARY DIAGNOSIS:   Past Medical History:  Diagnosis Date  . (HFpEF) heart failure with preserved ejection fraction ([place])    a. 08/2016 Echo: EF 50-55%.  [name] AAA (abdominal aortic aneurysm) ([place]) 06/05/2019   abdominal aortic aneurysm measuring 3.1 cm.  . Arthritis    arms  . Bladder calculus   . Chronic anemia   . Chronic anxiety   . CKD (chronic kidney disease), stage III ([place])   . Coronary artery disease CARDIOLOGIST-  DR Rockey Situ   a. CABG 2006 with  LIMA-LAD, SVG-Ramus, SVG-OM, seq SVG-PLV-PDA.  . DOE (dyspnea on exertion)   . Frequent falls 11/2019  . GERD (gastroesophageal reflux disease)   . Heart murmur   . Hematuria   . Hip fracture ([place]) 10/05/2018  . History of bladder stone   . History of colon polyps   . History of non-ST elevation myocardial infarction (NSTEMI)  03/30/2005   post op recovery inguinal hernia repair  . History of pressure ulcer   . History of recurrent UTIs    last admission 08-04-2018 pseudomonas UTI  . Hyperlipidemia   . Hyperplasia of prostate without lower urinary tract symptoms (LUTS)   . Hypertension   . Indwelling Foley catheter present   . Intracranial hematoma ([place]) 10/05/2018  . Ischemic cardiomyopathy    a. echo (01/13) 45-50% septal akinesis, mild MR; b. Echo  (01/16) ef 60-65%, mid-apical anteroseptal hypokinesis; c. echo 09-16-2016 ef 50-55%  . Left ureteral stone   . Lung nodule    noted 08/ 2018;  followed by pcp  . LVH (left ventricular hypertrophy) 09/16/2016   Moderate noted on ECHO  . Moderate mitral regurgitation    a.08/2016 Echo: Mod MR/TR.  [name] Orthostatic hypotension   . PAC (premature atrial contraction)   . Paroxysmal atrial flutter ([place])    a. dx 08/2016, started on amiodarone.  . Persistent atrial fibrillation [facility]) followed by cardiologist-- dr Rockey Situ   a. dx AFib 08/2016 s/p DCCV 10/2016-->eliquis (CHA2DS2VASc = 5).  . Pneumonia   . Pulmonary hypertension ([place])    last echo in epic 09-16-2016  . Recurrent prostate cancer ([place]) UROLOGIST-- DR BELL   s/p  radioactive prostate seed implants in 1997;    due to PSA rising pt has been doing lupron injection's few past several yrs at dr bell  office  . Renal calculi    bilateral per CT 06-18-2018  . S/P CABG x 5 04/04/2005   LIMA to LAD,  SVG to Ramus,  SVG to OM,  seqSVG to PLV and PDA  . Wears glasses     HOSPITAL COURSE:   1.  Severe sepsis, present on admission with acute hypoxic respiratory failure and bilateral aspiration pneumonia and Pseudomonas UTI.  The patient was given cefepime and Flagyl initially.  Patient finished treatment for pneumonia and given a few more days of cefepime to treat the Pseudomonas UTI.  Will complete the antibiotics while here in the hospital.  The patient's acute respiratory failure has resolved he has gone down from  5 L of oxygen to off oxygen at this point. 2.  Bilateral aspiration pneumonia.  The patient is on dysphagia 3 diet with nectar thick liquids.  Patient had a modified barium swallow please see separate report.  Pneumonia treated completely with antibiotics here in the hospital. 3.  Dysphagia.  Speech therapy did a modified barium swallow please see separate report.  Patient is on dysphagia 3 diet with nectar thick liquids.  For his ulcerations in the mouth I put on Magic mouthwash.  For his poor appetite I added Remeron at night.  Patient is high risk for aspiration and not keeping up with his nutritional needs.  Palliative care to follow at the facility.  Consideration for conversion over to hospice if declines. 4.  Iron deficiency anemia.  Last hemoglobin 8.8 5.  Hypernatremia, hypokalemia, hypophosphatemia and hypomagnesemia.  The patient has received intermittent replacements of these electrolytes and also IV fluids to lower the sodium.  Upon discharge sodium is 142, potassium 3.8, magnesium 1.6 but I did give 2 g of IV magnesium this morning.  And phosphorus is 2.1.  We will continue K-Phos supplementation for another 3 doses. 6.  Recent hip fracture and generalized weakness.  Patient will be discharged out to rehab.  Follow-up with Dr. Sabra Heck orthopedic surgery in 1 week for removal of staples. 7.  Paroxysmal atrial fibrillation on Eliquis for anticoagulation 8.  Hyperlipidemia unspecified.  We will continue to hold Crestor 9.  Prostate cancer.  We will leave it up to the family on whether or not to continue the nubeqa (which they have at home). 10.  Stage II decubiti, present on admission, right buttock and left buttock.  Partial-thickness loss of dermis presenting a shallow open ulcer with red, pink wound bed without slough.  Continue changing positions in bed. 11.  Palliative care to follow-up facility.  Patient is a DNR.  Consideration for hospice if patient declines.   DISCHARGE CONDITIONS:    Fair  CONSULTS OBTAINED:  Treatment Team:  Earnestine Leys, MD  DRUG ALLERGIES:   Allergies  Allergen Reactions  . Ciprofloxacin Other (See Comments)    PER HEART DR  . Ibuprofen Other (See Comments)    Kidney issues    DISCHARGE MEDICATIONS:   Allergies as of 09/18/2020      Reactions   Ciprofloxacin Other (See Comments)   PER HEART DR   Ibuprofen Other (See Comments)   Kidney issues      Medication List    STOP taking these medications   amiodarone 200 MG tablet Commonly known as: PACERONE   bisacodyl 10 MG suppository Commonly known as: DULCOLAX   polyethylene glycol 17 g packet Commonly known as: MIRALAX / GLYCOLAX   rosuvastatin 40 MG tablet Commonly known as: CRESTOR     TAKE  these medications   acetaminophen 325 MG tablet Commonly known as: TYLENOL Take 2 tablets (650 mg total) by mouth every 6 (six) hours as needed for mild pain (or Fever >/= 101). What changed:   medication strength  how much to take  reasons to take this   apixaban 5 MG Tabs tablet Commonly known as: Eliquis Take 1 tablet (5 mg total) by mouth 2 (two) times daily.   docusate sodium 100 MG capsule Commonly known as: COLACE Take 1 capsule (100 mg total) by mouth 2 (two) times daily.   ferrous sulfate 325 (65 FE) MG tablet Take 1 tablet (325 mg total) by mouth daily with breakfast.   magic mouthwash w/lidocaine Soln Take 5 mLs by mouth 4 (four) times daily for 10 days.   mirtazapine 7.5 MG tablet Commonly known as: REMERON Take 1 tablet (7.5 mg total) by mouth at bedtime.   multivitamin with minerals Tabs tablet Take 1 tablet by mouth daily.   Nubeqa 300 MG tablet Generic drug: darolutamide Take 2 tablets (600 mg total) by mouth 2 (two) times daily.   omeprazole 40 MG capsule Commonly known as: PRILOSEC TAKE 1 CAPSULE BY MOUTH EVERY DAY What changed: how much to take   potassium & sodium phosphates 280-160-250 MG Pack Commonly known as: PHOS-NAK Take 1  packet by mouth 4 (four) times daily -  with meals and at bedtime for 3 doses.   tamsulosin 0.4 MG Caps capsule Commonly known as: FLOMAX Take 1 capsule (0.4 mg total) by mouth daily after supper.   traMADol 50 MG tablet Commonly known as: ULTRAM Take 1 tablet (50 mg total) by mouth every 6 (six) hours as needed for moderate pain.        DISCHARGE INSTRUCTIONS:   Follow-up team at rehab 1 day Follow-up orthopedic surgery 1 week  If you experience worsening of your admission symptoms, develop shortness of breath, life threatening emergency, suicidal or homicidal thoughts you must seek medical attention immediately by calling 911 or calling your MD immediately  if symptoms less severe.  You Must read complete instructions/literature along with all the possible adverse reactions/side effects for all the Medicines you take and that have been prescribed to you. Take any new Medicines after you have completely understood and accept all the possible adverse reactions/side effects.   Please note  You were cared for by a hospitalist during your hospital stay. If you have any questions about your discharge medications or the care you received while you were in the hospital after you are discharged, you can call the unit and asked to speak with the hospitalist on call if the hospitalist that took care of you is not available. Once you are discharged, your primary care physician will handle any further medical issues. Please note that NO REFILLS for any discharge medications will be authorized once you are discharged, as it is imperative that you return to your primary care physician (or establish a relationship with a primary care physician if you do not have one) for your aftercare needs so that they can reassess your need for medications and monitor your lab values.    Today   CHIEF COMPLAINT:   Chief Complaint  Patient presents with  . Code Sepsis    HISTORY OF PRESENT ILLNESS:   Carl [name]  is a 85 y.o. male came in with sepsis   VITAL SIGNS:  Blood pressure 135/64, pulse 77, temperature 97.7 F (36.5 C), temperature source Oral, resp. rate 20, height  6\' 3"  (1.905 m), weight 72.4 kg, SpO2 93 %.  I/O:    Intake/Output Summary (Last 24 hours) at 09/18/2020 1344 Last data filed at 09/18/2020 9371 Gross per 24 hour  Intake 453.13 ml  Output 300 ml  Net 153.13 ml    PHYSICAL EXAMINATION:  GENERAL:  85 y.o.-year-old patient lying in the bed with no acute distress.  EYES: Pupils equal, round, reactive to light and accommodation. No scleral icterus. HEENT: Head atraumatic, normocephalic. Oropharynx and nasopharynx clear.  LUNGS: Decreased breath sounds bilateral bases, no wheezing, rales,rhonchi or crepitation. No use of accessory muscles of respiration.  CARDIOVASCULAR: S1, S2 normal. No murmurs, rubs, or gallops.  ABDOMEN: Soft, non-tender, non-distended.  EXTREMITIES: 2+ pedal edema.  NEUROLOGIC: Cranial nerves II through XII are intact.  PSYCHIATRIC: The patient is alert and answers questions appropriately.  SKIN: No obvious rash.   DATA REVIEW:   CBC Recent Labs  Lab 09/18/20 0521  WBC 7.0  HGB 8.8*  HCT 27.6*  PLT 335    Chemistries  Recent Labs  Lab 09/18/20 0521  NA 142  K 3.8  CL 118*  CO2 20*  GLUCOSE 104*  BUN 13  CREATININE 0.76  CALCIUM 7.8*  MG 1.6*  AST 16  ALT 16  ALKPHOS 64  BILITOT 0.9    Microbiology Results  Results for orders placed or performed during the hospital encounter of 09/10/20  Blood Culture (routine x 2)     Status: None   Collection Time: 09/10/20  5:37 PM   Specimen: BLOOD  Result Value Ref Range Status   Specimen Description BLOOD LEFT ANTECUBITAL  Final   Special Requests   Final    BOTTLES DRAWN AEROBIC AND ANAEROBIC Blood Culture adequate volume   Culture   Final    NO GROWTH 5 DAYS Performed at [facility], [address] [place], [place] [address]    Report Status  09/15/2020 FINAL  Final  Resp Panel by RT-PCR (Flu A&B, Covid) Nasopharyngeal Swab     Status: None   Collection Time: 09/10/20  5:39 PM   Specimen: Nasopharyngeal Swab; Nasopharyngeal(NP) swabs in vial transport medium  Result Value Ref Range Status   SARS Coronavirus 2 by RT PCR NEGATIVE NEGATIVE Final    Comment: (NOTE) SARS-CoV-2 target nucleic acids are NOT DETECTED.  The SARS-CoV-2 RNA is generally detectable in upper respiratory specimens during the acute phase of infection. The lowest concentration of SARS-CoV-2 viral copies this assay can detect is 138 copies/mL. A negative result does not preclude SARS-Cov-2 infection and should not be used as the sole basis for treatment or other patient management decisions. A negative result may occur with  improper specimen collection/handling, submission of specimen other than nasopharyngeal swab, presence of viral mutation(s) within the areas targeted by this assay, and inadequate number of viral copies(<138 copies/mL). A negative result must be combined with clinical observations, patient history, and epidemiological information. The expected result is Negative.  Fact Sheet for Patients:  EntrepreneurPulse.com.au  Fact Sheet for Healthcare Providers:  IncredibleEmployment.be  This test is no t yet approved or cleared by the [place] FDA and  has been authorized for detection and/or diagnosis of SARS-CoV-2 by FDA under an Emergency Use Authorization (EUA). This EUA will remain  in effect (meaning this test can be used) for the duration of the COVID-19 declaration under Section 564(b)(1) of the Act, 21 U.S.C.section 360bbb-3(b)(1), unless the authorization is terminated  or revoked sooner.  Influenza A by PCR NEGATIVE NEGATIVE Final   Influenza B by PCR NEGATIVE NEGATIVE Final    Comment: (NOTE) The Xpert Xpress SARS-CoV-2/FLU/RSV plus assay is intended as an aid in the diagnosis of  influenza from Nasopharyngeal swab specimens and should not be used as a sole basis for treatment. Nasal washings and aspirates are unacceptable for Xpert Xpress SARS-CoV-2/FLU/RSV testing.  Fact Sheet for Patients: EntrepreneurPulse.com.au  Fact Sheet for Healthcare Providers: IncredibleEmployment.be  This test is not yet approved or cleared by the [place] FDA and has been authorized for detection and/or diagnosis of SARS-CoV-2 by FDA under an Emergency Use Authorization (EUA). This EUA will remain in effect (meaning this test can be used) for the duration of the COVID-19 declaration under Section 564(b)(1) of the Act, 21 U.S.C. section 360bbb-3(b)(1), unless the authorization is terminated or revoked.  Performed at [facility], [address] [place], [place] [address]   Blood Culture (routine x 2)     Status: None   Collection Time: 09/10/20  5:42 PM   Specimen: BLOOD  Result Value Ref Range Status   Specimen Description BLOOD RIGHT ANTECUBITAL  Final   Special Requests   Final    BOTTLES DRAWN AEROBIC AND ANAEROBIC Blood Culture adequate volume   Culture   Final    NO GROWTH 5 DAYS Performed at [facility], [address], [place], [place] [address]    Report Status 09/15/2020 FINAL  Final  Urine culture     Status: Abnormal   Collection Time: 09/10/20 11:34 PM   Specimen: Urine, Random  Result Value Ref Range Status   Specimen Description   Final    URINE, RANDOM Performed at [facility], [address], [place], [place] [address]    Special Requests   Final    NONE Performed at [facility], [place],  [address]    Culture >=100,000 COLONIES/mL PSEUDOMONAS AERUGINOSA (A)  Final   Report Status 09/13/2020 FINAL  Final   Organism ID, Bacteria PSEUDOMONAS AERUGINOSA (A)  Final      Susceptibility   Pseudomonas aeruginosa - MIC*    CEFTAZIDIME 4 SENSITIVE  Sensitive     CIPROFLOXACIN <=0.25 SENSITIVE Sensitive     GENTAMICIN 2 SENSITIVE Sensitive     IMIPENEM 2 SENSITIVE Sensitive     PIP/TAZO 8 SENSITIVE Sensitive     CEFEPIME 4 SENSITIVE Sensitive     * >=100,000 COLONIES/mL PSEUDOMONAS AERUGINOSA    RADIOLOGY:  DG Chest Port 1 View  Result Date: 09/17/2020 CLINICAL DATA:  Status post fall. EXAM: PORTABLE CHEST 1 VIEW COMPARISON:  September 10, 2020. FINDINGS: The heart size and mediastinal contours are within normal limits. Status post coronary bypass graft. No pneumothorax or pleural effusion is noted. Bilateral lung opacities are again noted, right greater than left, concerning for multifocal pneumonia or less likely edema. The visualized skeletal structures are unremarkable. IMPRESSION: Bilateral lung opacities are again noted, right greater than left, concerning for multifocal pneumonia or less likely edema. Electronically Signed   By: Marijo Conception M.D.   On: 09/17/2020 08:15     Management plans discussed with the patient, family and they are in agreement.  CODE STATUS:     Code Status Orders  (From admission, onward)         Start     Ordered   09/10/20 1927  Do not attempt resuscitation (DNR)  Continuous       Question Answer Comment  In the event of cardiac or respiratory ARREST Do not call a "code blue"   In the event of cardiac or respiratory ARREST Do not perform Intubation, CPR, defibrillation or ACLS   In the event of cardiac or respiratory ARREST Use medication by any route, position, wound care, and other measures to relive pain and suffering. May use oxygen, suction and manual treatment of airway obstruction as needed for comfort.   Comments CODE STATUS was discussed and patient wishes to be placed on a DO NOT RESUSCITATE      09/10/20 1929        Code Status History    Date Active Date Inactive Code Status Order ID Comments User Context   09/02/2020 1018 09/07/2020 2011 DNR [id number]  Collier Bullock, MD ED    09/02/2020 0935 09/02/2020 1017 Full Code [id number]  Collier Bullock, MD ED   12/20/2019 1731 12/23/2019 1827 DNR [id number]  Nita Sells, MD Inpatient   12/19/2019 1343 12/20/2019 1731 Full Code [id number]  Lequita Halt, MD ED   03/08/2019 1132 03/10/2019 1627 Full Code [id number]  Lang Snow, NP ED   03/02/2019 2157 03/04/2019 1836 Full Code [id number]  Jani Gravel, MD ED   10/05/2018 1758 10/12/2018 0206 Full Code [id number]  [place], [place], DO ED   08/05/2018 0007 08/09/2018 1918 Full Code [id number]  Etta Quill, DO ED   01/05/2018 0309 01/06/2018 2143 Full Code [id number]  Rise Patience, MD ED   03/12/2017 1628 03/17/2017 2043 Full Code [id number]  Murlean Iba, MD Inpatient   Advance Care Planning Activity      TOTAL TIME TAKING CARE OF THIS PATIENT: 35 minutes.    Loletha Grayer M.D on 09/18/2020 at 1:44 PM  Between 7am to 6pm - Pager - [phone]  After 6pm go to [email or web address] - password [email or web address]  [facility]  CC: Primary care physician; [name], MD

## 2020-09-18 NOTE — Progress Notes (Signed)
Palliative: [name], Carl [name], is sitting up quietly in bed. He appears to be asleep, but wakes easily when I called his name. He is oriented to person place and situation, able to make his basic needs none. There is no family at bedside at this time. Carl [name] appears acutely/chronically ill and frail. He has a weak voice and is difficult to hear/understand at times.  We talked about his MBBS, silent aspiration and risk for pneumonia. Although Carl [name] tells me that he feels that his swallowing is improving, clinically testing shows that he continues to be at very high risk for aspiration pneumonia. We talked about finding a balance between giving him what he would like, water, thin liquids, and reducing his risk of aspiration pneumonia.  Carl [name] states that at this point he is willing to continue with thickened liquids only. He continues to agree to short-term rehab. He agrees to continue with outpatient palliative services  We talked about healthcare power of attorney. Carl [name] states that his daughter, Carl [name], is his healthcare surrogate. He states that Carl [name] and her son Carl [name] live in his home and help care for him. We talked about advanced directives, and Carl [name] agrees that he would not want extraordinary measures such as life support or a tube to feed him. He states that if it comes to that, "just let me go". I asked him if he has discussed what he does and does not want with Carl [name]. He states that he thinks he has, and believes that she knows his wishes. He continues to be active with [facility] outpatient palliative  Call to daughter, Carl [name].  Carl [name] states that she has been updated by her brother Carl [name].  She understands that her father will go to short-term rehab when able.  The ultimate goal is to return home.  Carl [name] states that she understands that Carl [name] may not do well in short-term rehab.  We talked about returning home with the benefits of hospice for "treat the  treatable" care.  Carl [name] is tearful, but states that she sees her father's decline and frailty.  Conference with attending, bedside nursing staff, transition of care team related to patient condition, needs, goals of care, desire to continue to treat, accept dietary restrictions.  Plan:   Continue to treat the treatable but no CPR or intubation. Agreeable to short-term rehab with [facility] when medically stable. Outpatient palliative to follow  40 minutes  Carl Axe, NP Palliative medicine team Team phone [phone] Greater than 50% of this time was spent counseling and coordinating care related to the above assessment and plan.

## 2020-09-18 NOTE — Care Management Important Message (Signed)
Important Message  Patient Details  Name: Carl [name] MRN: [id number] Date of Birth: 02/19/1936   Medicare Important Message Given:  Yes  Talked with the patient earlier today and reviewed the Important Message from Medicare with him and left his copy in his room.  He asked me to contact his daughter, [name] and review with her as well [phone]). She was in agreement with the discharge plan and I thanked her for her time.   Juliann Pulse A Allmond 09/18/2020, 1:53 PM

## 2020-09-18 NOTE — Plan of Care (Signed)

## 2020-09-20 DIAGNOSIS — L89312 Pressure ulcer of right buttock, stage 2: Secondary | ICD-10-CM | POA: Diagnosis not present

## 2020-09-20 DIAGNOSIS — L89322 Pressure ulcer of left buttock, stage 2: Secondary | ICD-10-CM | POA: Diagnosis not present

## 2020-09-25 DIAGNOSIS — M10039 Idiopathic gout, unspecified wrist: Secondary | ICD-10-CM | POA: Diagnosis not present

## 2020-09-26 DIAGNOSIS — L0390 Cellulitis, unspecified: Secondary | ICD-10-CM | POA: Diagnosis not present

## 2020-09-26 DIAGNOSIS — I5032 Chronic diastolic (congestive) heart failure: Secondary | ICD-10-CM | POA: Diagnosis not present

## 2020-09-26 DIAGNOSIS — R5381 Other malaise: Secondary | ICD-10-CM | POA: Diagnosis not present

## 2020-09-26 DIAGNOSIS — I480 Paroxysmal atrial fibrillation: Secondary | ICD-10-CM | POA: Diagnosis not present

## 2020-09-27 DIAGNOSIS — S72032A Displaced midcervical fracture of left femur, initial encounter for closed fracture: Secondary | ICD-10-CM | POA: Diagnosis not present

## 2020-10-02 DIAGNOSIS — I10 Essential (primary) hypertension: Secondary | ICD-10-CM | POA: Diagnosis not present

## 2020-10-02 DIAGNOSIS — L89152 Pressure ulcer of sacral region, stage 2: Secondary | ICD-10-CM | POA: Diagnosis not present

## 2020-10-02 DIAGNOSIS — I272 Pulmonary hypertension, unspecified: Secondary | ICD-10-CM | POA: Diagnosis not present

## 2020-10-02 DIAGNOSIS — J690 Pneumonitis due to inhalation of food and vomit: Secondary | ICD-10-CM | POA: Diagnosis not present

## 2020-10-02 DIAGNOSIS — I714 Abdominal aortic aneurysm, without rupture: Secondary | ICD-10-CM | POA: Diagnosis not present

## 2020-10-02 DIAGNOSIS — R1319 Other dysphagia: Secondary | ICD-10-CM | POA: Diagnosis not present

## 2020-10-02 DIAGNOSIS — N183 Chronic kidney disease, stage 3 unspecified: Secondary | ICD-10-CM | POA: Diagnosis not present

## 2020-10-02 DIAGNOSIS — R5381 Other malaise: Secondary | ICD-10-CM | POA: Diagnosis not present

## 2020-10-02 DIAGNOSIS — I480 Paroxysmal atrial fibrillation: Secondary | ICD-10-CM | POA: Diagnosis not present

## 2020-10-02 DIAGNOSIS — I5032 Chronic diastolic (congestive) heart failure: Secondary | ICD-10-CM | POA: Diagnosis not present

## 2020-10-02 DIAGNOSIS — S72002D Fracture of unspecified part of neck of left femur, subsequent encounter for closed fracture with routine healing: Secondary | ICD-10-CM | POA: Diagnosis not present

## 2020-10-02 DIAGNOSIS — M1388 Other specified arthritis, other site: Secondary | ICD-10-CM | POA: Diagnosis not present

## 2020-10-03 DIAGNOSIS — D72829 Elevated white blood cell count, unspecified: Secondary | ICD-10-CM | POA: Diagnosis not present

## 2020-10-04 DIAGNOSIS — L89322 Pressure ulcer of left buttock, stage 2: Secondary | ICD-10-CM | POA: Diagnosis not present

## 2020-10-08 DIAGNOSIS — R1312 Dysphagia, oropharyngeal phase: Secondary | ICD-10-CM | POA: Diagnosis not present

## 2020-10-08 DIAGNOSIS — R2689 Other abnormalities of gait and mobility: Secondary | ICD-10-CM | POA: Diagnosis not present

## 2020-10-08 DIAGNOSIS — Z9181 History of falling: Secondary | ICD-10-CM | POA: Diagnosis not present

## 2020-10-08 DIAGNOSIS — M6281 Muscle weakness (generalized): Secondary | ICD-10-CM | POA: Diagnosis not present

## 2020-10-08 DIAGNOSIS — J690 Pneumonitis due to inhalation of food and vomit: Secondary | ICD-10-CM | POA: Diagnosis not present

## 2020-10-08 DIAGNOSIS — Z736 Limitation of activities due to disability: Secondary | ICD-10-CM | POA: Diagnosis not present

## 2020-10-08 DIAGNOSIS — R488 Other symbolic dysfunctions: Secondary | ICD-10-CM | POA: Diagnosis not present

## 2020-10-09 DIAGNOSIS — M6281 Muscle weakness (generalized): Secondary | ICD-10-CM | POA: Diagnosis not present

## 2020-10-09 DIAGNOSIS — Z9181 History of falling: Secondary | ICD-10-CM | POA: Diagnosis not present

## 2020-10-09 DIAGNOSIS — R488 Other symbolic dysfunctions: Secondary | ICD-10-CM | POA: Diagnosis not present

## 2020-10-09 DIAGNOSIS — R1312 Dysphagia, oropharyngeal phase: Secondary | ICD-10-CM | POA: Diagnosis not present

## 2020-10-09 DIAGNOSIS — J690 Pneumonitis due to inhalation of food and vomit: Secondary | ICD-10-CM | POA: Diagnosis not present

## 2020-10-09 DIAGNOSIS — R2689 Other abnormalities of gait and mobility: Secondary | ICD-10-CM | POA: Diagnosis not present

## 2020-10-09 DIAGNOSIS — Z736 Limitation of activities due to disability: Secondary | ICD-10-CM | POA: Diagnosis not present

## 2020-10-10 ENCOUNTER — Non-Acute Institutional Stay: Payer: Medicare Other | Admitting: Primary Care

## 2020-10-10 ENCOUNTER — Other Ambulatory Visit: Payer: Self-pay

## 2020-10-10 DIAGNOSIS — J690 Pneumonitis due to inhalation of food and vomit: Secondary | ICD-10-CM | POA: Diagnosis not present

## 2020-10-10 DIAGNOSIS — R059 Cough, unspecified: Secondary | ICD-10-CM | POA: Diagnosis not present

## 2020-10-10 DIAGNOSIS — Z736 Limitation of activities due to disability: Secondary | ICD-10-CM | POA: Diagnosis not present

## 2020-10-10 DIAGNOSIS — R2689 Other abnormalities of gait and mobility: Secondary | ICD-10-CM | POA: Diagnosis not present

## 2020-10-10 DIAGNOSIS — R488 Other symbolic dysfunctions: Secondary | ICD-10-CM | POA: Diagnosis not present

## 2020-10-10 DIAGNOSIS — M6281 Muscle weakness (generalized): Secondary | ICD-10-CM | POA: Diagnosis not present

## 2020-10-10 DIAGNOSIS — R1312 Dysphagia, oropharyngeal phase: Secondary | ICD-10-CM | POA: Diagnosis not present

## 2020-10-10 DIAGNOSIS — Z9181 History of falling: Secondary | ICD-10-CM | POA: Diagnosis not present

## 2020-10-10 DIAGNOSIS — R062 Wheezing: Secondary | ICD-10-CM | POA: Diagnosis not present

## 2020-10-11 ENCOUNTER — Other Ambulatory Visit: Payer: Self-pay

## 2020-10-11 ENCOUNTER — Non-Acute Institutional Stay: Payer: Medicare Other | Admitting: Primary Care

## 2020-10-11 DIAGNOSIS — R2689 Other abnormalities of gait and mobility: Secondary | ICD-10-CM | POA: Diagnosis not present

## 2020-10-11 DIAGNOSIS — Z9181 History of falling: Secondary | ICD-10-CM | POA: Diagnosis not present

## 2020-10-11 DIAGNOSIS — L89322 Pressure ulcer of left buttock, stage 2: Secondary | ICD-10-CM | POA: Diagnosis not present

## 2020-10-11 DIAGNOSIS — Z736 Limitation of activities due to disability: Secondary | ICD-10-CM | POA: Diagnosis not present

## 2020-10-11 DIAGNOSIS — R1312 Dysphagia, oropharyngeal phase: Secondary | ICD-10-CM | POA: Diagnosis not present

## 2020-10-11 DIAGNOSIS — M6281 Muscle weakness (generalized): Secondary | ICD-10-CM | POA: Diagnosis not present

## 2020-10-11 DIAGNOSIS — J690 Pneumonitis due to inhalation of food and vomit: Secondary | ICD-10-CM | POA: Diagnosis not present

## 2020-10-11 DIAGNOSIS — R488 Other symbolic dysfunctions: Secondary | ICD-10-CM | POA: Diagnosis not present

## 2020-10-12 ENCOUNTER — Other Ambulatory Visit: Payer: Self-pay

## 2020-10-12 ENCOUNTER — Non-Acute Institutional Stay: Payer: Medicare Other | Admitting: Primary Care

## 2020-10-12 DIAGNOSIS — Z9181 History of falling: Secondary | ICD-10-CM | POA: Diagnosis not present

## 2020-10-12 DIAGNOSIS — I480 Paroxysmal atrial fibrillation: Secondary | ICD-10-CM | POA: Diagnosis not present

## 2020-10-12 DIAGNOSIS — Z736 Limitation of activities due to disability: Secondary | ICD-10-CM | POA: Diagnosis not present

## 2020-10-12 DIAGNOSIS — R634 Abnormal weight loss: Secondary | ICD-10-CM | POA: Diagnosis not present

## 2020-10-12 DIAGNOSIS — E43 Unspecified severe protein-calorie malnutrition: Secondary | ICD-10-CM

## 2020-10-12 DIAGNOSIS — J690 Pneumonitis due to inhalation of food and vomit: Secondary | ICD-10-CM | POA: Diagnosis not present

## 2020-10-12 DIAGNOSIS — R2689 Other abnormalities of gait and mobility: Secondary | ICD-10-CM | POA: Diagnosis not present

## 2020-10-12 DIAGNOSIS — R488 Other symbolic dysfunctions: Secondary | ICD-10-CM | POA: Diagnosis not present

## 2020-10-12 DIAGNOSIS — R1312 Dysphagia, oropharyngeal phase: Secondary | ICD-10-CM | POA: Diagnosis not present

## 2020-10-12 DIAGNOSIS — M6281 Muscle weakness (generalized): Secondary | ICD-10-CM | POA: Diagnosis not present

## 2020-10-12 DIAGNOSIS — Z515 Encounter for palliative care: Secondary | ICD-10-CM

## 2020-10-12 NOTE — Progress Notes (Signed)
[facility] Consult Note Telephone: [phone]  Fax: [phone]    Date of encounter: 10/12/20 PATIENT NAME: Carl [name] [address] [place] [address] [phone] (home)  DOB: 1936-04-20 MRN: [id number]  PRIMARY CARE PROVIDER:    Alvester Morin, MD,  [place]. [place] [address] [phone]  REFERRING PROVIDER:   Alvester Morin, MD [place]. [place],  [place] [address] [phone]  RESPONSIBLE PARTY:   Extended Emergency Contact Information Primary Emergency Contact: Colquitt,[name] Address: [address]          [place], [place] [address] Johnnette Litter of [employer] Phone: [phone] Relation: Daughter Secondary Emergency Contact: Cavanah,Pearley Jr.          [place],  [address] [place] Phone: [phone] Mobile Phone: [phone] Relation: Son  I met face to face with patient facility. Palliative Care was asked to follow this patient by consultation request of Slade-Hartman, Ivette Loyal* to help address advance care planning and goals of care. This is the initial  visit.   ASSESSMENT AND RECOMMENDATIONS:   1. Advance Care Planning/Goals of Care: Goals include to maximize quality of life and symptom management. Our advance care planning conversation included a discussion about:     DNR on file  Exploration of personal, cultural or spiritual beliefs that might influence medical decisions   2. Symptom Management:   I met with patient in his nursing home room. He was alert but drowsy. He states some discomfort in his lower extremities he's very cachectic and states poor appetite.   I later spoke with his daughter who states that he used to weight much more. She states that before his hospital stay he was staying at their home able to ambulate to the bathroom and do some self-care. He now has total care needs. [facility] but [facility] now been transferred to long-term care.   I will monitor him for hospice appropriateness. If his weight continues to decline in his function declines, he would be a candidate.  3. Follow up Palliative Care Visit: Palliative care will continue to follow for goals of care clarification and symptom management. Return 4 weeks or prn.  4. Family /Caregiver/Community Supports: Daughter is POA, lives in [place]  5. Cognitive / Functional decline:   A and O x 1, dependent in all adls and iadls, declining.  I spent 35 minutes providing this consultation,  from 1500 to 1535. More than 50% of the time in this consultation was spent in counseling and care coordination.  CODE STATUS: DNR  PPS: 30%  HOSPICE ELIGIBILITY/DIAGNOSIS: yes with concordant goals of care/ abnormal weight loss  Subjective:  CHIEF COMPLAINT: debility  HISTORY OF PRESENT ILLNESS:  Carl [name] is a 85 y.o. year old male  with recent sepsis, weight loss, immobility, functional decline, h/o prostate cancer, severe weight loss. He is declining in function per his family and staff. He had been living at home until the recent hospital stay, and was able to ambulate some. He is now bedbound and dependent in all adls. Albumin at that time was 1.8-2.  We are asked to consult around advance care planning and complex medical decision making.    Review and summarization of old Epic records shows or history from other than patient.  Review or lab tests, radiology,  or medicine. Labs and xrays showing pneumonia Review of case with family member. Daughter [name]  History obtained  from review of EMR, discussion with primary team, and  interview with family, caregiver  and/or Mr. Hayashi. Records reviewed and summarized above.   CURRENT PROBLEM LIST:  Patient Active Problem List   Diagnosis Date Noted  . Pressure injury of skin 09/17/2020  . Aspiration pneumonia of both lower lobes due to  gastric secretions ([place])   . Dysphagia   . Hypernatremia   . Hypokalemia   . Hypophosphatasia   . Severe sepsis ([place]) 09/10/2020  . Acute respiratory failure with hypoxia ([place]) 09/10/2020  . Closed left hip fracture ([place]) 09/02/2020  . Transaminitis 09/02/2020  . AF (paroxysmal atrial fibrillation) ([place]) 09/02/2020  . History of UTI 02/14/2020  . Constipation 02/14/2020  . Iron deficiency anemia 01/17/2020  . Near syncope   . Protein-calorie malnutrition, severe 12/22/2019  . Fall 12/19/2019  . Weight loss 08/30/2019  . Onychomycosis 08/30/2019  . AAA (abdominal aortic aneurysm) ([place]) 08/26/2018  . Pseudomonas urinary tract infection 08/05/2018  . Kidney stones 06/30/2018  . Allergic rhinitis 04/26/2018  . Urolithiasis 01/05/2018  . Frequent urination 01/01/2018  . Colon polyps 09/15/2017  . GERD (gastroesophageal reflux disease) 09/15/2017  . Wrist pain, acute, right 05/25/2017  . Low back pain 03/24/2017  . Lung nodule seen on imaging study: 7 mm nodule left midlung 03/13/2017  . Physical deconditioning 03/13/2017  . Normocytic anemia 03/13/2017  . Fall at home, initial encounter 03/12/2017  . Abdominal soreness 03/12/2017  . Generalized weakness 03/12/2017  . Leukocytosis 03/12/2017  . Hyponatremia 03/12/2017  . Orthostatic hypotension 02/16/2017  . Anticoagulated 09/19/2016  . Prostate CA ([place]) 09/04/2016  . Longstanding persistent atrial fibrillation ([place]) 09/04/2016  . Cardiomyopathy, ischemic 07/16/2012  . Chronic kidney disease (CKD), stage III (moderate) ([place]) 07/16/2012  . Essential hypertension 08/05/2011  . Murmur 08/05/2011  . 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 Dyslipidemia 02/04/2011   PAST MEDICAL HISTORY:  Active Ambulatory Problems    Diagnosis Date Noted  . 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 Dyslipidemia 02/04/2011  . Essential hypertension 08/05/2011  . Murmur 08/05/2011  . Cardiomyopathy, ischemic 07/16/2012  . Chronic kidney disease (CKD), stage  III (moderate) ([place]) 07/16/2012  . Prostate CA ([place]) 09/04/2016  . Longstanding persistent atrial fibrillation ([place]) 09/04/2016  . Anticoagulated 09/19/2016  . Orthostatic hypotension 02/16/2017  . Fall at home, initial encounter 03/12/2017  . Abdominal soreness 03/12/2017  . Generalized weakness 03/12/2017  . Leukocytosis 03/12/2017  . Hyponatremia 03/12/2017  . Lung nodule seen on imaging study: 7 mm nodule left midlung 03/13/2017  . Physical deconditioning 03/13/2017  . Normocytic anemia 03/13/2017  . Low back pain 03/24/2017  . Wrist pain, acute, right 05/25/2017  . Colon polyps 09/15/2017  . GERD (gastroesophageal reflux disease) 09/15/2017  . Frequent urination 01/01/2018  . Urolithiasis 01/05/2018  . Allergic rhinitis 04/26/2018  . Kidney stones 06/30/2018  . Pseudomonas urinary tract infection 08/05/2018  . AAA (abdominal aortic aneurysm) ([place]) 08/26/2018  . Weight loss 08/30/2019  . Onychomycosis 08/30/2019  . Fall 12/19/2019  . Protein-calorie malnutrition, severe 12/22/2019  . Near syncope   . Iron deficiency anemia 01/17/2020  . History of UTI 02/14/2020  . Constipation 02/14/2020  . Closed left hip fracture ([place]) 09/02/2020  . Transaminitis 09/02/2020  . AF (paroxysmal atrial fibrillation) ([place]) 09/02/2020  . Severe sepsis ([place]) 09/10/2020  . Acute respiratory failure with hypoxia ([place]) 09/10/2020  . Aspiration pneumonia of both lower lobes due to gastric secretions ([place])   . Dysphagia   .  Hypernatremia   . Hypokalemia   . Hypophosphatasia   . Pressure injury of skin 09/17/2020   Resolved Ambulatory Problems    Diagnosis Date Noted  . Acute congestive heart failure ([place]) 09/04/2016  . Blood in stool 09/19/2016  . Atrial fibrillation with RVR ([place]) 03/12/2017  . Acute cystitis without hematuria 03/24/2017  . Community acquired pneumonia of right lower lobe of lung 09/15/2017  . AKI (acute kidney injury) ([place]) 01/01/2018  . Complicated UTI (urinary tract  infection) 01/05/2018  . Acute pyelonephritis   . Skin tear of elbow without complication 82/70/7867  . Hematuria 08/05/2018  . Pressure injury of skin 08/05/2018  . Pseudomonas aeruginosa infection 08/06/2018  . Acute cystitis with hematuria   . Hip fracture ([place]) 10/05/2018  . Intracranial hematoma ([place]) 10/05/2018  . Displaced fracture of right femoral neck ([place]) 10/07/2018  . Acute lower UTI 03/02/2019  . AMS (altered mental status) 03/02/2019  . Altered mental status 03/08/2019  . UTI (urinary tract infection) 04/25/2019  . Pyelonephritis 12/20/2019   Past Medical History:  Diagnosis Date  . (HFpEF) heart failure with preserved ejection fraction ([place])   . Arthritis   . Bladder calculus   . Chronic anemia   . Chronic anxiety   . CKD (chronic kidney disease), stage III ([place])   . Coronary artery disease CARDIOLOGIST-  DR Rockey Situ  . DOE (dyspnea on exertion)   . Frequent falls 11/2019  . Heart murmur   . History of bladder stone   . History of colon polyps   . History of non-ST elevation myocardial infarction (NSTEMI) 03/30/2005  . History of pressure ulcer   . History of recurrent UTIs   . Hyperlipidemia   . Hyperplasia of prostate without lower urinary tract symptoms (LUTS)   . Hypertension   . Indwelling Foley catheter present   . Ischemic cardiomyopathy   . Left ureteral stone   . Lung nodule   . LVH (left ventricular hypertrophy) 09/16/2016  . Moderate mitral regurgitation   . PAC (premature atrial contraction)   . Paroxysmal atrial flutter ([place])   . Persistent atrial fibrillation [facility]) followed by cardiologist-- dr Rockey Situ  . Pneumonia   . Pulmonary hypertension ([place])   . Recurrent prostate cancer ([place]) UROLOGIST-- DR BELL  . Renal calculi   . S/P CABG x 5 04/04/2005  . Wears glasses    SOCIAL HX:  Social History   Tobacco Use  . Smoking status: Former Smoker    Years: 2.00    Types: Cigarettes    Quit date: 07/28/1960    Years since quitting: 60.2  .  Smokeless tobacco: Never Used  Substance Use Topics  . Alcohol use: No   FAMILY HX:  Family History  Problem Relation Age of Onset  . Heart disease Mother   . Cancer Mother   . Cancer Father   . Cancer Brother       ALLERGIES:  Allergies  Allergen Reactions  . Ciprofloxacin Other (See Comments)    PER HEART DR  . Ibuprofen Other (See Comments)    Kidney issues     PERTINENT MEDICATIONS:  Outpatient Encounter Medications as of 10/12/2020  Medication Sig  . acetaminophen (TYLENOL) 325 MG tablet Take 2 tablets (650 mg total) by mouth every 6 (six) hours as needed for mild pain (or Fever >/= 101).  [name] apixaban (ELIQUIS) 5 MG TABS tablet Take 1 tablet (5 mg total) by mouth 2 (two) times daily.  . darolutamide (NUBEQA) 300 MG tablet  Take 2 tablets (600 mg total) by mouth 2 (two) times daily.  [name] docusate sodium (COLACE) 100 MG capsule Take 1 capsule (100 mg total) by mouth 2 (two) times daily.  . ferrous sulfate 325 (65 FE) MG tablet Take 1 tablet (325 mg total) by mouth daily with breakfast.  . mirtazapine (REMERON) 7.5 MG tablet Take 1 tablet (7.5 mg total) by mouth at bedtime.  . Multiple Vitamin (MULTIVITAMIN WITH MINERALS) TABS tablet Take 1 tablet by mouth daily.  [name] omeprazole (PRILOSEC) 40 MG capsule TAKE 1 CAPSULE BY MOUTH EVERY DAY  . tamsulosin (FLOMAX) 0.4 MG CAPS capsule Take 1 capsule (0.4 mg total) by mouth daily after supper.  . traMADol (ULTRAM) 50 MG tablet Take 1 tablet (50 mg total) by mouth every 6 (six) hours as needed for moderate pain.   No facility-administered encounter medications on file as of 10/12/2020.    Objective: ROS/staff  General: NAD ENMT:  Endorses dysphagia  Cardiovascular: denies chest pain Pulmonary: denies  cough, denies increased SOB Abdomen: endorses poor appetite, denies  constipation, endorses incontinence of bowel GU: denies dysuria, endorses incontinence of urine MSK:  endorses ROM limitations, no falls reported Skin: sacral wounds,  leg wounds Neurological: endorses weakness, denies pain, denies insomnia Psych: Endorses confused mood Heme/lymph/immuno: denies bruises, abnormal bleeding  Physical Exam: Current and past weights: 159 lbs, has been 200 lbs in past  but has lost wt unintentionally Constitutional: NAD General: frail appearing, thin EYES: anicteric sclera, lids intact, no discharge  ENMT: intact hearing,oral mucous membranes moist, dentition intact CV: S1S2, RRR, no LE edema Pulmonary: LCTA, no increased work of breathing, no cough, no audible wheezes, room air Abdomen: intake 25-50%, soft and non tender, no ascites GU: deferred MSK: severe sarcopenia, decreased ROM in all extremities, no contractures of LE, non ambulatory Skin: warm and dry, wounds on visible skin of LE Neuro: Generalized weakness, severe cognitive impairment Psych: non- anxious affect, A and O x 1 Hem/lymph/immuno: no widespread bruising    Thank you for the opportunity to participate in the care of Mr. Kirsch.  The palliative care team will continue to follow. Please call our office at [phone] if we can be of additional assistance.  Jason Coop, NP , DNP, MPH, AGPCNP-BC, ACHPN   COVID-19 PATIENT SCREENING TOOL  Person answering questions: _______staff____________   1.  Is the patient or any family member in the home showing any signs or symptoms regarding respiratory infection?                  Person with Symptom  ______________na___________ a. Fever/chills/headache                                                        Yes___ No__X_            b. Shortness of breath                                                            Yes___ No__X_           c. Cough/congestion  Yes___  No__X_          d. Muscle/Body aches/pains                                                   Yes___ No__X_         e. Gastrointestinal symptoms (diarrhea,nausea)             Yes___ No__X_          f. Sudden loss of smell or taste      Yes___ No__X_        2. Within the past 10 days, has anyone living in the home had any contact with someone with or under investigation for COVID-19?    Yes___ No__X__   Person __________________

## 2020-10-14 DIAGNOSIS — J690 Pneumonitis due to inhalation of food and vomit: Secondary | ICD-10-CM | POA: Diagnosis not present

## 2020-10-14 DIAGNOSIS — Z736 Limitation of activities due to disability: Secondary | ICD-10-CM | POA: Diagnosis not present

## 2020-10-14 DIAGNOSIS — Z9181 History of falling: Secondary | ICD-10-CM | POA: Diagnosis not present

## 2020-10-14 DIAGNOSIS — R1312 Dysphagia, oropharyngeal phase: Secondary | ICD-10-CM | POA: Diagnosis not present

## 2020-10-14 DIAGNOSIS — M6281 Muscle weakness (generalized): Secondary | ICD-10-CM | POA: Diagnosis not present

## 2020-10-14 DIAGNOSIS — R488 Other symbolic dysfunctions: Secondary | ICD-10-CM | POA: Diagnosis not present

## 2020-10-14 DIAGNOSIS — R2689 Other abnormalities of gait and mobility: Secondary | ICD-10-CM | POA: Diagnosis not present

## 2020-10-15 ENCOUNTER — Emergency Department
Admission: EM | Admit: 2020-10-15 | Discharge: 2020-10-15 | Disposition: A | Discharge status: Home / Self Care | Payer: Medicare Other | Attending: Emergency Medicine | Admitting: Emergency Medicine

## 2020-10-15 ENCOUNTER — Emergency Department: Payer: Medicare Other

## 2020-10-15 DIAGNOSIS — Z7901 Long term (current) use of anticoagulants: Secondary | ICD-10-CM | POA: Diagnosis not present

## 2020-10-15 DIAGNOSIS — Z87891 Personal history of nicotine dependence: Secondary | ICD-10-CM | POA: Diagnosis not present

## 2020-10-15 DIAGNOSIS — R079 Chest pain, unspecified: Secondary | ICD-10-CM | POA: Diagnosis not present

## 2020-10-15 DIAGNOSIS — R488 Other symbolic dysfunctions: Secondary | ICD-10-CM | POA: Diagnosis not present

## 2020-10-15 DIAGNOSIS — R0789 Other chest pain: Secondary | ICD-10-CM | POA: Diagnosis not present

## 2020-10-15 DIAGNOSIS — Z736 Limitation of activities due to disability: Secondary | ICD-10-CM | POA: Diagnosis not present

## 2020-10-15 DIAGNOSIS — J690 Pneumonitis due to inhalation of food and vomit: Secondary | ICD-10-CM | POA: Diagnosis not present

## 2020-10-15 DIAGNOSIS — Z951 Presence of aortocoronary bypass graft: Secondary | ICD-10-CM | POA: Insufficient documentation

## 2020-10-15 DIAGNOSIS — R2689 Other abnormalities of gait and mobility: Secondary | ICD-10-CM | POA: Diagnosis not present

## 2020-10-15 DIAGNOSIS — R1312 Dysphagia, oropharyngeal phase: Secondary | ICD-10-CM | POA: Diagnosis not present

## 2020-10-15 DIAGNOSIS — Z20822 Contact with and (suspected) exposure to covid-19: Secondary | ICD-10-CM | POA: Insufficient documentation

## 2020-10-15 DIAGNOSIS — I499 Cardiac arrhythmia, unspecified: Secondary | ICD-10-CM | POA: Diagnosis not present

## 2020-10-15 DIAGNOSIS — I509 Heart failure, unspecified: Secondary | ICD-10-CM | POA: Diagnosis not present

## 2020-10-15 DIAGNOSIS — Z9181 History of falling: Secondary | ICD-10-CM | POA: Diagnosis not present

## 2020-10-15 DIAGNOSIS — Z7401 Bed confinement status: Secondary | ICD-10-CM | POA: Diagnosis not present

## 2020-10-15 DIAGNOSIS — N183 Chronic kidney disease, stage 3 unspecified: Secondary | ICD-10-CM | POA: Insufficient documentation

## 2020-10-15 DIAGNOSIS — Z96643 Presence of artificial hip joint, bilateral: Secondary | ICD-10-CM | POA: Diagnosis not present

## 2020-10-15 DIAGNOSIS — I2510 Atherosclerotic heart disease of native coronary artery without angina pectoris: Secondary | ICD-10-CM | POA: Insufficient documentation

## 2020-10-15 DIAGNOSIS — I4811 Longstanding persistent atrial fibrillation: Secondary | ICD-10-CM | POA: Insufficient documentation

## 2020-10-15 DIAGNOSIS — R197 Diarrhea, unspecified: Secondary | ICD-10-CM | POA: Diagnosis not present

## 2020-10-15 DIAGNOSIS — M6281 Muscle weakness (generalized): Secondary | ICD-10-CM | POA: Diagnosis not present

## 2020-10-15 DIAGNOSIS — I130 Hypertensive heart and chronic kidney disease with heart failure and stage 1 through stage 4 chronic kidney disease, or unspecified chronic kidney disease: Secondary | ICD-10-CM | POA: Insufficient documentation

## 2020-10-15 DIAGNOSIS — Z743 Need for continuous supervision: Secondary | ICD-10-CM | POA: Diagnosis not present

## 2020-10-15 DIAGNOSIS — M2550 Pain in unspecified joint: Secondary | ICD-10-CM | POA: Diagnosis not present

## 2020-10-15 LAB — CBC WITH DIFFERENTIAL/PLATELET
Abs Immature Granulocytes: 0.05 10*3/uL (ref 0.00–0.07)
Basophils Absolute: 0 10*3/uL (ref 0.0–0.1)
Basophils Relative: 0 %
Eosinophils Absolute: 0 10*3/uL (ref 0.0–0.5)
Eosinophils Relative: 0 %
HCT: 35.3 % — ABNORMAL LOW (ref 39.0–52.0)
Hemoglobin: 11.5 g/dL — ABNORMAL LOW (ref 13.0–17.0)
Immature Granulocytes: 0 %
Lymphocytes Relative: 9 %
Lymphs Abs: 1.1 10*3/uL (ref 0.7–4.0)
MCH: 31.2 pg (ref 26.0–34.0)
MCHC: 32.6 g/dL (ref 30.0–36.0)
MCV: 95.7 fL (ref 80.0–100.0)
Monocytes Absolute: 0.7 10*3/uL (ref 0.1–1.0)
Monocytes Relative: 6 %
Neutro Abs: 10.2 10*3/uL — ABNORMAL HIGH (ref 1.7–7.7)
Neutrophils Relative %: 85 %
Platelets: 295 10*3/uL (ref 150–400)
RBC: 3.69 MIL/uL — ABNORMAL LOW (ref 4.22–5.81)
RDW: 17.9 % — ABNORMAL HIGH (ref 11.5–15.5)
WBC: 12.1 10*3/uL — ABNORMAL HIGH (ref 4.0–10.5)
nRBC: 0 % (ref 0.0–0.2)

## 2020-10-15 LAB — COMPREHENSIVE METABOLIC PANEL
ALT: 9 U/L (ref 0–44)
AST: 14 U/L — ABNORMAL LOW (ref 15–41)
Albumin: 2.6 g/dL — ABNORMAL LOW (ref 3.5–5.0)
Alkaline Phosphatase: 89 U/L (ref 38–126)
Anion gap: 7 (ref 5–15)
BUN: 18 mg/dL (ref 8–23)
CO2: 25 mmol/L (ref 22–32)
Calcium: 8.4 mg/dL — ABNORMAL LOW (ref 8.9–10.3)
Chloride: 100 mmol/L (ref 98–111)
Creatinine, Ser: 1.06 mg/dL (ref 0.61–1.24)
GFR, Estimated: >60 mL/min (ref >60)
Glucose, Bld: 137 mg/dL — ABNORMAL HIGH (ref 70–99)
Potassium: 4.7 mmol/L (ref 3.5–5.1)
Sodium: 132 mmol/L — ABNORMAL LOW (ref 135–145)
Total Bilirubin: 1 mg/dL (ref 0.3–1.2)
Total Protein: 6 g/dL — ABNORMAL LOW (ref 6.5–8.1)

## 2020-10-15 LAB — RESP PANEL BY RT-PCR (FLU A&B, COVID) ARPGX2
Influenza A by PCR: NEGATIVE
Influenza B by PCR: NEGATIVE
SARS Coronavirus 2 by RT PCR: NEGATIVE

## 2020-10-15 LAB — TROPONIN I (HIGH SENSITIVITY)
Troponin I (High Sensitivity): 6 ng/L (ref <18)
Troponin I (High Sensitivity): 5 ng/L (ref <18)

## 2020-10-15 LAB — PROCALCITONIN: Procalcitonin: <0.1 ng/mL

## 2020-10-15 MED ORDER — FENTANYL CITRATE (PF) 100 MCG/2ML IJ SOLN
50.0000 ug | Freq: Once | INTRAMUSCULAR | Status: AC
  Administered 2020-10-15: 50 ug via INTRAVENOUS
  Filled 2020-10-15: qty 2

## 2020-10-15 MED ORDER — ACETAMINOPHEN 500 MG PO TABS
1000.0000 mg | ORAL_TABLET | Freq: Once | ORAL | Status: AC
  Administered 2020-10-15: 1000 mg via ORAL
  Filled 2020-10-15: qty 2

## 2020-10-15 MED ORDER — LIDOCAINE 5 % EX PTCH
1.0000 | MEDICATED_PATCH | CUTANEOUS | Status: DC
  Administered 2020-10-15: 1 via TRANSDERMAL
  Filled 2020-10-15: qty 1

## 2020-10-15 NOTE — ED Triage Notes (Signed)
Pt reports pain to his left chest that worsens with deep breaths. Pt was dx with PNA and started on doxycycline 10/10/20. Pt denies dizziness or recent falls.

## 2020-10-15 NOTE — ED Provider Notes (Signed)
[facility] Emergency Department Provider Note  ____________________________________________   Event Date/Time   First MD Initiated Contact with Patient 10/15/20 1721     (approximate)  I have reviewed the triage vital signs and the nursing notes.   HISTORY  Chief Complaint Chest Pain   HPI Carl [name] is a 85 y.o. male with a past medical history of CHF, abdominal AAA, chronic anemia, CKD, CAD, HTN, HDL, A. fib anticoagulated on Eliquis, recent diagnosis of pneumonia currently on doxycycline who presents via EMS from SNF for assessment of some left-sided chest discomfort he developed earlier this afternoon.  He denies any headache, earache, sore throat, cough, shortness of breath, back pain abdominal pain, vomiting, urinary symptoms does endorse little diarrhea for last couple days.  States he has not been able to walk since he came to his nursing facility after hip surgery couple months ago.  No recent falls or injuries.  No heavy lifting.  His pain is exacerbated by any patient of his chest.  No other related aggravating factors.  No medications administered prior to arrival.         Past Medical History:  Diagnosis Date  . (HFpEF) heart failure with preserved ejection fraction ([place])    a. 08/2016 Echo: EF 50-55%.  [name] AAA (abdominal aortic aneurysm) ([place]) 06/05/2019   abdominal aortic aneurysm measuring 3.1 cm.  . Arthritis    arms  . Bladder calculus   . Chronic anemia   . Chronic anxiety   . CKD (chronic kidney disease), stage III ([place])   . Coronary artery disease CARDIOLOGIST-  DR Rockey Situ   a. CABG 2006 with  LIMA-LAD, SVG-[place]amus, SVG-OM, seq SVG-PLV-PDA.  . DOE (dyspnea on exertion)   . Frequent falls 11/2019  . GERD (gastroesophageal reflux disease)   . Heart murmur   . Hematuria   . Hip fracture ([place]) 10/05/2018  . History of bladder stone   . History of colon polyps   . History of non-ST elevation myocardial infarction (NSTEMI)  03/30/2005   post op recovery inguinal hernia repair  . History of pressure ulcer   . History of recurrent UTIs    last admission 08-04-2018 pseudomonas UTI  . Hyperlipidemia   . Hyperplasia of prostate without lower urinary tract symptoms (LUTS)   . Hypertension   . Indwelling Foley catheter present   . Intracranial hematoma ([place]) 10/05/2018  . Ischemic cardiomyopathy    a. echo (01/13) 45-50% septal akinesis, mild MR; b. Echo  (01/16) ef 60-65%, mid-apical anteroseptal hypokinesis; c. echo 09-16-2016 ef 50-55%  . Left ureteral stone   . Lung nodule    noted 08/ 2018;  followed by pcp  . LVH (left ventricular hypertrophy) 09/16/2016   Moderate noted on ECHO  . Moderate mitral regurgitation    a.08/2016 Echo: Mod MR/TR.  [name] Orthostatic hypotension   . PAC (premature atrial contraction)   . Paroxysmal atrial flutter ([place])    a. dx 08/2016, started on amiodarone.  . Persistent atrial fibrillation [facility]) followed by cardiologist-- dr Rockey Situ   a. dx AFib 08/2016 s/p DCCV 10/2016-->eliquis (CHA2DS2VASc = 5).  . Pneumonia   . Pulmonary hypertension ([place])    last echo in epic 09-16-2016  . Recurrent prostate cancer ([place]) UROLOGIST-- DR BELL   s/p  radioactive prostate seed implants in 1997;    due to PSA rising pt has been doing lupron injection's few past several yrs at dr bell office  . Renal calculi    bilateral  per CT 06-18-2018  . S/P CABG x 5 04/04/2005   LIMA to LAD,  SVG to Ramus,  SVG to OM,  seqSVG to PLV and PDA  . Wears glasses     Patient Active Problem List   Diagnosis Date Noted  . Pressure injury of skin 09/17/2020  . Aspiration pneumonia of both lower lobes due to gastric secretions ([place])   . Dysphagia   . Hypernatremia   . Hypokalemia   . Hypophosphatasia   . Severe sepsis ([place]) 09/10/2020  . Acute respiratory failure with hypoxia ([place]) 09/10/2020  . Closed left hip fracture ([place]) 09/02/2020  . Transaminitis 09/02/2020  . AF (paroxysmal atrial fibrillation)  ([place]) 09/02/2020  . History of UTI 02/14/2020  . Constipation 02/14/2020  . Iron deficiency anemia 01/17/2020  . Near syncope   . Protein-calorie malnutrition, severe 12/22/2019  . Fall 12/19/2019  . Weight loss 08/30/2019  . Onychomycosis 08/30/2019  . AAA (abdominal aortic aneurysm) ([place]) 08/26/2018  . Pseudomonas urinary tract infection 08/05/2018  . Kidney stones 06/30/2018  . Allergic rhinitis 04/26/2018  . Urolithiasis 01/05/2018  . Frequent urination 01/01/2018  . Colon polyps 09/15/2017  . GERD (gastroesophageal reflux disease) 09/15/2017  . Wrist pain, acute, right 05/25/2017  . Low back pain 03/24/2017  . Lung nodule seen on imaging study: 7 mm nodule left midlung 03/13/2017  . Physical deconditioning 03/13/2017  . Normocytic anemia 03/13/2017  . Fall at home, initial encounter 03/12/2017  . Abdominal soreness 03/12/2017  . Generalized weakness 03/12/2017  . Leukocytosis 03/12/2017  . Hyponatremia 03/12/2017  . Orthostatic hypotension 02/16/2017  . Anticoagulated 09/19/2016  . Prostate CA ([place]) 09/04/2016  . Longstanding persistent atrial fibrillation ([place]) 09/04/2016  . Cardiomyopathy, ischemic 07/16/2012  . Chronic kidney disease (CKD), stage III (moderate) ([place]) 07/16/2012  . Essential hypertension 08/05/2011  . Murmur 08/05/2011  . 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 Dyslipidemia 02/04/2011    Past Surgical History:  Procedure Laterality Date  . CARDIAC CATHETERIZATION  04/02/2005   dr Doreatha Lew    Critical three-vessel coronary disease --  Essentially normal left ventricular function , ef 55%  . CARDIOVASCULAR STRESS TEST  05-01-2008  dr Doreatha Lew /  dr harding   Low risk nuclear study w/ no ischemia or scar but flattened septal motion/  ef 64%  . CARDIOVERSION N/A 11/19/2016   Procedure: CARDIOVERSION;  Surgeon: Jerline Pain, MD;  Location: [facility];  Service: Cardiovascular;  Laterality: N/A;  . COLONOSCOPY N/A 10/23/2016   Procedure: COLONOSCOPY;   Surgeon: Milus Banister, MD;  Location: [facility];  Service: Endoscopy;  Laterality: N/A;  . CORONARY ARTERY BYPASS GRAFT  04/04/2005   dr  Ricard Dillon   LIMA - LAD,  SVG - Ramus, SVG - OM,  seqSVG - PLV and PDA  . CYSTOSCOPY WITH LITHOLAPAXY N/A 05/07/2016   Procedure: CYSTOSCOPY WITH LITHOLAPAXY;  Surgeon: Rana Snare, MD;  Location: [facility];  Service: Urology;  Laterality: N/A;  . CYSTOSCOPY WITH RETROGRADE PYELOGRAM, URETEROSCOPY AND STENT PLACEMENT  09/03/2018   Procedure: CYSTOSCOPY WITH RETROGRADE PYELOGRAM, URETEROSCOPY LASER LITHOTRIPSY AND STENT PLACEMENT;  Surgeon: Lucas Mallow, MD;  Location: [facility];  Service: Urology;;  . CYSTOSCOPY/URETEROSCOPY/HOLMIUM LASER/STENT PLACEMENT Right 06/15/2019   Procedure: CYSTOSCOPY RIGHT URETEROSCOPY HOLMIUM LASER STENT PLACEMENT;  Surgeon: Lucas Mallow, MD;  Location: [facility];  Service: Urology;  Laterality: Right;  . HIP ARTHROPLASTY Left 09/03/2020   Procedure: ARTHROPLASTY BIPOLAR HIP (HEMIARTHROPLASTY);  Surgeon: Earnestine Leys,  MD;  Location: [facility];  Service: Orthopedics;  Laterality: Left;  . HOLMIUM LASER APPLICATION N/A 99/37/1696   Procedure: HOLMIUM LASER APPLICATION;  Surgeon: Rana Snare, MD;  Location: [facility];  Service: Urology;  Laterality: N/A;  . INGUINAL HERNIA REPAIR Left 03-30-2005  dr Excell Seltzer   incarcerated   . RADIOACTIVE PROSTATE SEED IMPLANTS  1997  . TOTAL HIP ARTHROPLASTY Right 10/07/2018   Procedure: TOTAL HIP ARTHROPLASTY ANTERIOR APPROACH;  Surgeon: Rod Can, MD;  Location: [facility];  Service: Orthopedics;  Laterality: Right;  . TRANSTHORACIC ECHOCARDIOGRAM  08/24/2014   mid anteroseptal and distal septak hypokinetic,  mild focal basal LVH, ef 60-65%,  grade 1 diastolic dysfunction/  mild MR/  mild LAE/  mild dilated RV with normal RVSP/  trivial TR    Prior to Admission medications   Medication Sig Start Date End Date Taking? Authorizing Provider  acetaminophen  (TYLENOL) 325 MG tablet Take 2 tablets (650 mg total) by mouth every 6 (six) hours as needed for mild pain (or Fever >/= 101). 09/18/20   Wieting, Richard, MD  apixaban (ELIQUIS) 5 MG TABS tablet Take 1 tablet (5 mg total) by mouth 2 (two) times daily. 05/03/20   [name], NP  darolutamide (NUBEQA) 300 MG tablet Take 2 tablets (600 mg total) by mouth 2 (two) times daily. 09/07/20   Fritzi Mandes, MD  docusate sodium (COLACE) 100 MG capsule Take 1 capsule (100 mg total) by mouth 2 (two) times daily. 09/07/20   Fritzi Mandes, MD  ferrous sulfate 325 (65 FE) MG tablet Take 1 tablet (325 mg total) by mouth daily with breakfast. 09/07/20   Fritzi Mandes, MD  mirtazapine (REMERON) 7.5 MG tablet Take 1 tablet (7.5 mg total) by mouth at bedtime. 09/18/20   Loletha Grayer, MD  Multiple Vitamin (MULTIVITAMIN WITH MINERALS) TABS tablet Take 1 tablet by mouth daily. 09/07/20   Fritzi Mandes, MD  omeprazole (PRILOSEC) 40 MG capsule TAKE 1 CAPSULE BY MOUTH EVERY DAY 09/18/20   [name], MD  tamsulosin (FLOMAX) 0.4 MG CAPS capsule Take 1 capsule (0.4 mg total) by mouth daily after supper. 01/06/18   [name], MD  traMADol (ULTRAM) 50 MG tablet Take 1 tablet (50 mg total) by mouth every 6 (six) hours as needed for moderate pain. 09/18/20   Loletha Grayer, MD    Allergies Ciprofloxacin and Ibuprofen  Family History  Problem Relation Age of Onset  . Heart disease Mother   . Cancer Mother   . Cancer Father   . Cancer Brother     Social History Social History   Tobacco Use  . Smoking status: Former Smoker    Years: 2.00    Types: Cigarettes    Quit date: 07/28/1960    Years since quitting: 60.2  . Smokeless tobacco: Never Used  Vaping Use  . Vaping Use: Never used  Substance Use Topics  . Alcohol use: No  . Drug use: No    Review of Systems  Review of Systems  Constitutional: Negative for chills and fever.  HENT: Negative for sore throat.   Eyes: Negative for pain.  Respiratory:  Negative for cough and stridor.   Cardiovascular: Positive for chest pain.  Gastrointestinal: Negative for vomiting.  Genitourinary: Negative for dysuria.  Skin: Negative for rash.  Neurological: Positive for weakness ( chronic in lower extremities since hip surgery 2 months ago). Negative for seizures, loss of consciousness and headaches.  Psychiatric/Behavioral: Negative for suicidal ideas.  All other  systems reviewed and are negative.     ____________________________________________   PHYSICAL EXAM:  VITAL SIGNS: ED Triage Vitals  Enc Vitals Group     BP      Pulse      Resp      Temp      Temp src      SpO2      Weight      Height      Head Circumference      Peak Flow      Pain Score      Pain Loc      Pain Edu?      Excl. in [place]?    Vitals:   10/15/20 2200 10/15/20 2230  BP: 97/67 92/66  Pulse: 88   Resp: 16 15  Temp:    SpO2: 95% 97%   Physical Exam Vitals and nursing note reviewed.  Constitutional:      Appearance: He is well-developed. He is ill-appearing.  HENT:     Head: Normocephalic and atraumatic.     Right Ear: External ear normal.     Left Ear: External ear normal.     Nose: Nose normal.  Eyes:     Conjunctiva/sclera: Conjunctivae normal.  Cardiovascular:     Rate and Rhythm: Normal rate. Rhythm irregular.     Heart sounds: Murmur heard.   Systolic murmur is present.   Pulmonary:     Effort: Pulmonary effort is normal. No respiratory distress.     Breath sounds: Rhonchi present.  Abdominal:     Palpations: Abdomen is soft.     Tenderness: There is no abdominal tenderness.  Musculoskeletal:     Cervical back: Neck supple.  Skin:    General: Skin is warm and dry.     Capillary Refill: Capillary refill takes less than 2 seconds.  Neurological:     Mental Status: He is alert.     Patient is oriented to year but not date.  There is some tenderness over the left costochondral joints.  2+ bilateral radial and DP pulses.  Patient does  appear overall chronically ill-appearing and underweight.  His abdomen is soft nontender throughout.  No others evidence of acute trauma on exam although patient has some chronic appearing skin tears over his upper extremities.  ____________________________________________   LABS (all labs ordered are listed, but only abnormal results are displayed)  Labs Reviewed  CBC WITH DIFFERENTIAL/PLATELET - Abnormal; Notable for the following components:      Result Value   WBC 12.1 (*)    RBC 3.69 (*)    Hemoglobin 11.5 (*)    HCT 35.3 (*)    RDW 17.9 (*)    Neutro Abs 10.2 (*)    All other components within normal limits  COMPREHENSIVE METABOLIC PANEL - Abnormal; Notable for the following components:   Sodium 132 (*)    Glucose, Bld 137 (*)    Calcium 8.4 (*)    Total Protein 6.0 (*)    Albumin 2.6 (*)    AST 14 (*)    All other components within normal limits  RESP PANEL BY RT-PCR (FLU A&B, COVID) ARPGX2  PROCALCITONIN  TROPONIN I (HIGH SENSITIVITY)  TROPONIN I (HIGH SENSITIVITY)   ____________________________________________  EKG  A. fib with ventricular rate of 93, normal axis, and diffuse nonspecific changes without clear evidence of ischemia.  Unremarkable intervals. ____________________________________________  RADIOLOGY  ED MD interpretation: No clear for consolidation, effusion, significant edema, pneumothorax or other clear acute  intrathoracic process.  Official radiology report(s): DG Chest 1 View  Result Date: 10/15/2020 CLINICAL DATA:  Chest pain EXAM: CHEST  1 VIEW COMPARISON:  09/17/2020 FINDINGS: Prior CABG. Heart is normal size. No confluent opacities or effusions. Interstitial prominence within the lungs, likely scarring, most notable in the lung bases. No effusions or acute bony abnormality. IMPRESSION: Chronic changes.  No active disease. Electronically Signed   By: Rolm Baptise M.D.   On: 10/15/2020 18:33     ____________________________________________   PROCEDURES  Procedure(s) performed (including Critical Care):  .1-3 Lead EKG Interpretation Performed by: Lucrezia Starch, MD Authorized by: Lucrezia Starch, MD     Interpretation: normal     ECG rate assessment: normal     Rhythm: atrial fibrillation     Ectopy: none     Conduction: normal       ____________________________________________   INITIAL IMPRESSION / ASSESSMENT AND PLAN / ED COURSE      Patient presents with above-stated exam for assessment of some left-sided chest pain that began several fairly acutely earlier today.  On arrival he is afebrile hemodynamically stable.  He does have some tenderness over his left chest and costochondral joints and overall appears fairly chronically ill and underweight.   Primary differential includes ACS, pain from no pneumonia, pneumothorax, symptomatic effusion PE, dissection, myocarditis, pericarditis and pleurisy.  Low suspicion for PE as patient states he is compliant with his Eliquis.  Chest x-ray does not show evidence of pneumonia I suspect this is likely resolving given patient states he has been on Doxy for around at least a week.  No evidence of pneumothorax large effusion or other clear acute intrathoracic process on x-ray.  CBC shows slight leukocytosis with WBC count of 12.1 but hemoglobin of 11.5 compared to 8.44 weeks ago and not consistent with acute symptomatic anemia.  CMP shows no significant electrolyte or metabolic derangements.  While ECG has some nonspecific findings given nonelevated troponins x2 low suspicion for ACS or myocarditis.  Procalcitonin is undetectable.  Covid and influenza are negative.  Suspect costochondritis versus possible pleurisy from patient's recent bacterial pneumonia.  However given stable vitals otherwise reassuring exam and work-up and improvement in pain on reassessment after blood noted analgesia given I believe he is safe for  discharge with plan for close PCP follow-up at his SNF.  Explained this to patient and his son.  All questions answered to the best my ability.  Patient discharged stable condition.  Strict return precautions advised and discussed.       ____________________________________________   FINAL CLINICAL IMPRESSION(S) / ED DIAGNOSES  Final diagnoses:  Chest pain, unspecified type    Medications  lidocaine (LIDODERM) 5 % 1 patch (1 patch Transdermal Patch Applied 10/15/20 1748)  acetaminophen (TYLENOL) tablet 1,000 mg (1,000 mg Oral Given 10/15/20 1736)  fentaNYL (SUBLIMAZE) injection 50 mcg (50 mcg Intravenous Given 10/15/20 0174)     ED Discharge Orders    None       Note:  This document was prepared using Dragon voice recognition software and may include unintentional dictation errors.   Lucrezia Starch, MD 10/16/20 [phone]

## 2020-10-16 DIAGNOSIS — R2689 Other abnormalities of gait and mobility: Secondary | ICD-10-CM | POA: Diagnosis not present

## 2020-10-16 DIAGNOSIS — Z736 Limitation of activities due to disability: Secondary | ICD-10-CM | POA: Diagnosis not present

## 2020-10-16 DIAGNOSIS — R488 Other symbolic dysfunctions: Secondary | ICD-10-CM | POA: Diagnosis not present

## 2020-10-16 DIAGNOSIS — R1312 Dysphagia, oropharyngeal phase: Secondary | ICD-10-CM | POA: Diagnosis not present

## 2020-10-16 DIAGNOSIS — M6281 Muscle weakness (generalized): Secondary | ICD-10-CM | POA: Diagnosis not present

## 2020-10-16 DIAGNOSIS — J690 Pneumonitis due to inhalation of food and vomit: Secondary | ICD-10-CM | POA: Diagnosis not present

## 2020-10-16 DIAGNOSIS — Z9181 History of falling: Secondary | ICD-10-CM | POA: Diagnosis not present

## 2020-10-17 DIAGNOSIS — R2689 Other abnormalities of gait and mobility: Secondary | ICD-10-CM | POA: Diagnosis not present

## 2020-10-17 DIAGNOSIS — Z9181 History of falling: Secondary | ICD-10-CM | POA: Diagnosis not present

## 2020-10-17 DIAGNOSIS — J690 Pneumonitis due to inhalation of food and vomit: Secondary | ICD-10-CM | POA: Diagnosis not present

## 2020-10-17 DIAGNOSIS — R1312 Dysphagia, oropharyngeal phase: Secondary | ICD-10-CM | POA: Diagnosis not present

## 2020-10-17 DIAGNOSIS — Z736 Limitation of activities due to disability: Secondary | ICD-10-CM | POA: Diagnosis not present

## 2020-10-17 DIAGNOSIS — M6281 Muscle weakness (generalized): Secondary | ICD-10-CM | POA: Diagnosis not present

## 2020-10-17 DIAGNOSIS — R488 Other symbolic dysfunctions: Secondary | ICD-10-CM | POA: Diagnosis not present

## 2020-10-18 DIAGNOSIS — R488 Other symbolic dysfunctions: Secondary | ICD-10-CM | POA: Diagnosis not present

## 2020-10-18 DIAGNOSIS — M6281 Muscle weakness (generalized): Secondary | ICD-10-CM | POA: Diagnosis not present

## 2020-10-18 DIAGNOSIS — R1312 Dysphagia, oropharyngeal phase: Secondary | ICD-10-CM | POA: Diagnosis not present

## 2020-10-18 DIAGNOSIS — Z736 Limitation of activities due to disability: Secondary | ICD-10-CM | POA: Diagnosis not present

## 2020-10-18 DIAGNOSIS — R2689 Other abnormalities of gait and mobility: Secondary | ICD-10-CM | POA: Diagnosis not present

## 2020-10-18 DIAGNOSIS — J690 Pneumonitis due to inhalation of food and vomit: Secondary | ICD-10-CM | POA: Diagnosis not present

## 2020-10-18 DIAGNOSIS — Z9181 History of falling: Secondary | ICD-10-CM | POA: Diagnosis not present

## 2020-10-19 DIAGNOSIS — J690 Pneumonitis due to inhalation of food and vomit: Secondary | ICD-10-CM | POA: Diagnosis not present

## 2020-10-19 DIAGNOSIS — R1312 Dysphagia, oropharyngeal phase: Secondary | ICD-10-CM | POA: Diagnosis not present

## 2020-10-19 DIAGNOSIS — Z736 Limitation of activities due to disability: Secondary | ICD-10-CM | POA: Diagnosis not present

## 2020-10-19 DIAGNOSIS — R488 Other symbolic dysfunctions: Secondary | ICD-10-CM | POA: Diagnosis not present

## 2020-10-19 DIAGNOSIS — M6281 Muscle weakness (generalized): Secondary | ICD-10-CM | POA: Diagnosis not present

## 2020-10-19 DIAGNOSIS — R2689 Other abnormalities of gait and mobility: Secondary | ICD-10-CM | POA: Diagnosis not present

## 2020-10-19 DIAGNOSIS — Z9181 History of falling: Secondary | ICD-10-CM | POA: Diagnosis not present

## 2020-10-22 DIAGNOSIS — R488 Other symbolic dysfunctions: Secondary | ICD-10-CM | POA: Diagnosis not present

## 2020-10-22 DIAGNOSIS — Z9181 History of falling: Secondary | ICD-10-CM | POA: Diagnosis not present

## 2020-10-22 DIAGNOSIS — Z736 Limitation of activities due to disability: Secondary | ICD-10-CM | POA: Diagnosis not present

## 2020-10-22 DIAGNOSIS — R1312 Dysphagia, oropharyngeal phase: Secondary | ICD-10-CM | POA: Diagnosis not present

## 2020-10-22 DIAGNOSIS — J690 Pneumonitis due to inhalation of food and vomit: Secondary | ICD-10-CM | POA: Diagnosis not present

## 2020-10-22 DIAGNOSIS — R2689 Other abnormalities of gait and mobility: Secondary | ICD-10-CM | POA: Diagnosis not present

## 2020-10-22 DIAGNOSIS — M6281 Muscle weakness (generalized): Secondary | ICD-10-CM | POA: Diagnosis not present

## 2020-10-23 DIAGNOSIS — J690 Pneumonitis due to inhalation of food and vomit: Secondary | ICD-10-CM | POA: Diagnosis not present

## 2020-10-23 DIAGNOSIS — R2689 Other abnormalities of gait and mobility: Secondary | ICD-10-CM | POA: Diagnosis not present

## 2020-10-23 DIAGNOSIS — R1312 Dysphagia, oropharyngeal phase: Secondary | ICD-10-CM | POA: Diagnosis not present

## 2020-10-23 DIAGNOSIS — R488 Other symbolic dysfunctions: Secondary | ICD-10-CM | POA: Diagnosis not present

## 2020-10-23 DIAGNOSIS — Z736 Limitation of activities due to disability: Secondary | ICD-10-CM | POA: Diagnosis not present

## 2020-10-23 DIAGNOSIS — Z9181 History of falling: Secondary | ICD-10-CM | POA: Diagnosis not present

## 2020-10-23 DIAGNOSIS — M6281 Muscle weakness (generalized): Secondary | ICD-10-CM | POA: Diagnosis not present

## 2020-10-24 DIAGNOSIS — R1312 Dysphagia, oropharyngeal phase: Secondary | ICD-10-CM | POA: Diagnosis not present

## 2020-10-24 DIAGNOSIS — Z9181 History of falling: Secondary | ICD-10-CM | POA: Diagnosis not present

## 2020-10-24 DIAGNOSIS — R2689 Other abnormalities of gait and mobility: Secondary | ICD-10-CM | POA: Diagnosis not present

## 2020-10-24 DIAGNOSIS — Z736 Limitation of activities due to disability: Secondary | ICD-10-CM | POA: Diagnosis not present

## 2020-10-24 DIAGNOSIS — R488 Other symbolic dysfunctions: Secondary | ICD-10-CM | POA: Diagnosis not present

## 2020-10-24 DIAGNOSIS — J690 Pneumonitis due to inhalation of food and vomit: Secondary | ICD-10-CM | POA: Diagnosis not present

## 2020-10-24 DIAGNOSIS — M6281 Muscle weakness (generalized): Secondary | ICD-10-CM | POA: Diagnosis not present

## 2020-10-25 DIAGNOSIS — M6281 Muscle weakness (generalized): Secondary | ICD-10-CM | POA: Diagnosis not present

## 2020-10-25 DIAGNOSIS — J690 Pneumonitis due to inhalation of food and vomit: Secondary | ICD-10-CM | POA: Diagnosis not present

## 2020-10-25 DIAGNOSIS — R1312 Dysphagia, oropharyngeal phase: Secondary | ICD-10-CM | POA: Diagnosis not present

## 2020-10-25 DIAGNOSIS — Z9181 History of falling: Secondary | ICD-10-CM | POA: Diagnosis not present

## 2020-10-25 DIAGNOSIS — Z736 Limitation of activities due to disability: Secondary | ICD-10-CM | POA: Diagnosis not present

## 2020-10-25 DIAGNOSIS — R2689 Other abnormalities of gait and mobility: Secondary | ICD-10-CM | POA: Diagnosis not present

## 2020-10-25 DIAGNOSIS — R488 Other symbolic dysfunctions: Secondary | ICD-10-CM | POA: Diagnosis not present

## 2020-10-26 DIAGNOSIS — R1312 Dysphagia, oropharyngeal phase: Secondary | ICD-10-CM | POA: Diagnosis not present

## 2020-10-26 DIAGNOSIS — M6281 Muscle weakness (generalized): Secondary | ICD-10-CM | POA: Diagnosis not present

## 2020-10-26 DIAGNOSIS — Z9181 History of falling: Secondary | ICD-10-CM | POA: Diagnosis not present

## 2020-10-26 DIAGNOSIS — R488 Other symbolic dysfunctions: Secondary | ICD-10-CM | POA: Diagnosis not present

## 2020-10-26 DIAGNOSIS — Z736 Limitation of activities due to disability: Secondary | ICD-10-CM | POA: Diagnosis not present

## 2020-10-26 DIAGNOSIS — R2689 Other abnormalities of gait and mobility: Secondary | ICD-10-CM | POA: Diagnosis not present

## 2020-10-27 DIAGNOSIS — R488 Other symbolic dysfunctions: Secondary | ICD-10-CM | POA: Diagnosis not present

## 2020-10-27 DIAGNOSIS — R1312 Dysphagia, oropharyngeal phase: Secondary | ICD-10-CM | POA: Diagnosis not present

## 2020-10-27 DIAGNOSIS — R2689 Other abnormalities of gait and mobility: Secondary | ICD-10-CM | POA: Diagnosis not present

## 2020-10-27 DIAGNOSIS — Z736 Limitation of activities due to disability: Secondary | ICD-10-CM | POA: Diagnosis not present

## 2020-10-27 DIAGNOSIS — M6281 Muscle weakness (generalized): Secondary | ICD-10-CM | POA: Diagnosis not present

## 2020-10-27 DIAGNOSIS — Z9181 History of falling: Secondary | ICD-10-CM | POA: Diagnosis not present

## 2020-10-29 DIAGNOSIS — R488 Other symbolic dysfunctions: Secondary | ICD-10-CM | POA: Diagnosis not present

## 2020-10-29 DIAGNOSIS — Z79899 Other long term (current) drug therapy: Secondary | ICD-10-CM | POA: Diagnosis not present

## 2020-10-29 DIAGNOSIS — Z736 Limitation of activities due to disability: Secondary | ICD-10-CM | POA: Diagnosis not present

## 2020-10-29 DIAGNOSIS — D509 Iron deficiency anemia, unspecified: Secondary | ICD-10-CM | POA: Diagnosis not present

## 2020-10-29 DIAGNOSIS — R2689 Other abnormalities of gait and mobility: Secondary | ICD-10-CM | POA: Diagnosis not present

## 2020-10-29 DIAGNOSIS — M6281 Muscle weakness (generalized): Secondary | ICD-10-CM | POA: Diagnosis not present

## 2020-10-29 DIAGNOSIS — Z9181 History of falling: Secondary | ICD-10-CM | POA: Diagnosis not present

## 2020-10-29 DIAGNOSIS — R1312 Dysphagia, oropharyngeal phase: Secondary | ICD-10-CM | POA: Diagnosis not present

## 2020-10-30 DIAGNOSIS — M6281 Muscle weakness (generalized): Secondary | ICD-10-CM | POA: Diagnosis not present

## 2020-10-30 DIAGNOSIS — R2689 Other abnormalities of gait and mobility: Secondary | ICD-10-CM | POA: Diagnosis not present

## 2020-10-30 DIAGNOSIS — Z736 Limitation of activities due to disability: Secondary | ICD-10-CM | POA: Diagnosis not present

## 2020-10-30 DIAGNOSIS — R488 Other symbolic dysfunctions: Secondary | ICD-10-CM | POA: Diagnosis not present

## 2020-10-30 DIAGNOSIS — R1312 Dysphagia, oropharyngeal phase: Secondary | ICD-10-CM | POA: Diagnosis not present

## 2020-10-30 DIAGNOSIS — Z9181 History of falling: Secondary | ICD-10-CM | POA: Diagnosis not present

## 2020-10-31 DIAGNOSIS — R488 Other symbolic dysfunctions: Secondary | ICD-10-CM | POA: Diagnosis not present

## 2020-10-31 DIAGNOSIS — Z736 Limitation of activities due to disability: Secondary | ICD-10-CM | POA: Diagnosis not present

## 2020-10-31 DIAGNOSIS — Z9181 History of falling: Secondary | ICD-10-CM | POA: Diagnosis not present

## 2020-10-31 DIAGNOSIS — M6281 Muscle weakness (generalized): Secondary | ICD-10-CM | POA: Diagnosis not present

## 2020-10-31 DIAGNOSIS — R1312 Dysphagia, oropharyngeal phase: Secondary | ICD-10-CM | POA: Diagnosis not present

## 2020-10-31 DIAGNOSIS — R2689 Other abnormalities of gait and mobility: Secondary | ICD-10-CM | POA: Diagnosis not present

## 2020-11-01 DIAGNOSIS — R1312 Dysphagia, oropharyngeal phase: Secondary | ICD-10-CM | POA: Diagnosis not present

## 2020-11-01 DIAGNOSIS — Z9181 History of falling: Secondary | ICD-10-CM | POA: Diagnosis not present

## 2020-11-01 DIAGNOSIS — R488 Other symbolic dysfunctions: Secondary | ICD-10-CM | POA: Diagnosis not present

## 2020-11-01 DIAGNOSIS — R2689 Other abnormalities of gait and mobility: Secondary | ICD-10-CM | POA: Diagnosis not present

## 2020-11-01 DIAGNOSIS — Z736 Limitation of activities due to disability: Secondary | ICD-10-CM | POA: Diagnosis not present

## 2020-11-01 DIAGNOSIS — S72032A Displaced midcervical fracture of left femur, initial encounter for closed fracture: Secondary | ICD-10-CM | POA: Diagnosis not present

## 2020-11-01 DIAGNOSIS — M6281 Muscle weakness (generalized): Secondary | ICD-10-CM | POA: Diagnosis not present

## 2020-11-02 ENCOUNTER — Non-Acute Institutional Stay: Payer: Medicare Other | Admitting: Primary Care

## 2020-11-02 ENCOUNTER — Other Ambulatory Visit: Payer: Self-pay

## 2020-11-02 DIAGNOSIS — E43 Unspecified severe protein-calorie malnutrition: Secondary | ICD-10-CM | POA: Diagnosis not present

## 2020-11-02 DIAGNOSIS — Z515 Encounter for palliative care: Secondary | ICD-10-CM

## 2020-11-02 DIAGNOSIS — R488 Other symbolic dysfunctions: Secondary | ICD-10-CM | POA: Diagnosis not present

## 2020-11-02 DIAGNOSIS — G8929 Other chronic pain: Secondary | ICD-10-CM

## 2020-11-02 DIAGNOSIS — Z9181 History of falling: Secondary | ICD-10-CM | POA: Diagnosis not present

## 2020-11-02 DIAGNOSIS — R634 Abnormal weight loss: Secondary | ICD-10-CM | POA: Diagnosis not present

## 2020-11-02 DIAGNOSIS — M545 Low back pain, unspecified: Secondary | ICD-10-CM

## 2020-11-02 DIAGNOSIS — R1312 Dysphagia, oropharyngeal phase: Secondary | ICD-10-CM | POA: Diagnosis not present

## 2020-11-02 DIAGNOSIS — L89002 Pressure ulcer of unspecified elbow, stage 2: Secondary | ICD-10-CM | POA: Diagnosis not present

## 2020-11-02 DIAGNOSIS — M6281 Muscle weakness (generalized): Secondary | ICD-10-CM | POA: Diagnosis not present

## 2020-11-02 DIAGNOSIS — Z736 Limitation of activities due to disability: Secondary | ICD-10-CM | POA: Diagnosis not present

## 2020-11-02 DIAGNOSIS — R2689 Other abnormalities of gait and mobility: Secondary | ICD-10-CM | POA: Diagnosis not present

## 2020-11-02 NOTE — Progress Notes (Signed)
[facility] Consult Note Telephone: [phone]  Fax: [phone]    Date of encounter: 11/02/20 PATIENT NAME: Carl [name] [address] [place] [address]   [phone] (home)  DOB: [birth year] MRN: [id number] PRIMARY CARE PROVIDER:    Alvester Morin, MD,  [place]. [place] [address] [phone]  REFERRING PROVIDER:   Alvester Morin, MD [address]. [place],  [place] [address] [phone]  RESPONSIBLE PARTY:    Contact Information    Name Relation Home Work Mobile   Mottley,Ormond Graham. Son [phone]  [phone]   HOLDYN, POYSER Daughter   [phone]   Puskarich,Connie Daughter   [phone]      I met face to face with patient and family in [facility] facility. Palliative Care was asked to follow this patient by consultation request of  Slade-Hartman, Ivette Loyal* to address advance care planning and complex medical decision making. This is a follow up visit.    ASSESSMENT AND RECOMMENDATIONS:   1. Advance Care Planning/Goals of Care: Goals include to maximize quality of life and symptom management. Our advance care planning conversation included a discussion about:     Exploration of personal, cultural or spiritual beliefs that might influence medical decisions  I called POA [name]. I had to leave a message  that I wanted to start him on some pain medicine. I would also like to speak with her when available about goals of care. Patient does have a DNR but I am suspicious that this pain and weight loss is from a neoplastic process. How ever we will wait for a work up to know for sure, but ACP is indicated in any event.  2. Symptom Management:   I met with patient today in his nursing home room. He was in bed and states he did not want to get up today. He endorses pain all over, especially in his hips. Discussed with staff his current status.   Nutrition. He  is eating fairly well but continues to lose weight precipitously. He now weighs 129.5 pounds and 12 months ago weighed  closer to his baseline of 200 lbs. He continues to lose roughly 10 pounds a month. Albumin yesterday was 2.6. This is actually an improvement over a month ago however weight loss continues. I have requested mid afternoon magic cup instead of with his meals as he complains of early satiety. I increased his mirtazapine to 15 mg per sublingual tablet.  Pain: He currently does not have anything but tramadol for PRN pain. He is in constant pain and needs schedule medication as his cognition is moderately impaired.  I ordered acetaminophen CR 650 mg po Q8 hours, acetaminophen 500 mg po q 8 hrs prn pain,  and tramadol 25 mg po Q6 hours. He is going to have some work up for this weight loss and pain and I anticipate Titrating pain medication.  He said he went to the hospital yesterday for chest pain, left-sided. It was actually several weeks ago.  Today he states he thinks he may have shoulder pain. He was seen and released, negative troponins, neg for PE or cardiac issues per CT and ECG. He was Rx for PNA at that time and this seems to be resolving well.  Mobility Staff endorses he refuses to get out of bed,  likely due to untreated pain. I am hopeful that treating his pain around the clock will help improve his mobility.  3. Follow up Palliative Care Visit: Palliative care  will continue to follow for advance care planning and  clarification and symptom management. Return 1-2 weeks or prn.  4. Family /Caregiver/Community Supports: Daughter is Designer, industrial/product, lives in [place].  5. Cognitive / Functional decline: A and O x 2, dependent in all adls and iadls.  This visit was coded based on medical decision making (MDM).  CODE STATUS: DNR  PPS: 30%  HOSPICE ELIGIBILITY/DIAGNOSIS: TBD  Medical reason for visit: Pain, weight loss  HISTORY OF PRESENT ILLNESS:  [name] is a 85 y.o. year old  male  with h/o severe weight loss, prostate cancer, fractures, debility and frailty. He is seen today for pain in context possibly of hip fx, immobility or prostate cancer,  which is constant, 6/10 on PAINAD, and generalized but hips and back are noted. He has not had medications given to relieve pain and his mobility is impaired due to pain.   History obtained from review of EMR, discussion with primary team, and  interview with family, caregiver  and/or Mr. Eakes. Records reviewed and summarized above.  Review lab tests PSA noted in 2021 to be 7.1. Albumin 2.6, negative troponin noted from ED trip 3/22. Review radiology results Most recent CXR clear Review medicine study results  3/22 ECG not evident for MI  CURRENT PROBLEM LIST:  Patient Active Problem List   Diagnosis Date Noted  . Pressure injury of skin 09/17/2020  . Aspiration pneumonia of both lower lobes due to gastric secretions ([place])   . Dysphagia   . Hypernatremia   . Hypokalemia   . Hypophosphatasia   . Severe sepsis ([place]) 09/10/2020  . Acute respiratory failure with hypoxia ([place]) 09/10/2020  . Closed left hip fracture ([place]) 09/02/2020  . Transaminitis 09/02/2020  . AF (paroxysmal atrial fibrillation) ([place]) 09/02/2020  . History of UTI 02/14/2020  . Constipation 02/14/2020  . Iron deficiency anemia 01/17/2020  . Near syncope   . Protein-calorie malnutrition, severe 12/22/2019  . Fall 12/19/2019  . Weight loss 08/30/2019  . Onychomycosis 08/30/2019  . AAA (abdominal aortic aneurysm) ([place]) 08/26/2018  . Pseudomonas urinary tract infection 08/05/2018  . Kidney stones 06/30/2018  . Allergic rhinitis 04/26/2018  . Urolithiasis 01/05/2018  . Frequent urination 01/01/2018  . Colon polyps 09/15/2017  . GERD (gastroesophageal reflux disease) 09/15/2017  . Wrist pain, acute, right 05/25/2017  . Low back pain 03/24/2017  . Lung nodule seen on imaging study: 7 mm nodule left midlung 03/13/2017  . Physical deconditioning  03/13/2017  . Normocytic anemia 03/13/2017  . Fall at home, initial encounter 03/12/2017  . Abdominal soreness 03/12/2017  . Generalized weakness 03/12/2017  . Leukocytosis 03/12/2017  . Hyponatremia 03/12/2017  . Orthostatic hypotension 02/16/2017  . Anticoagulated 09/19/2016  . Prostate CA ([place]) 09/04/2016  . Longstanding persistent atrial fibrillation ([place]) 09/04/2016  . Cardiomyopathy, ischemic 07/16/2012  . Chronic kidney disease (CKD), stage III (moderate) ([place]) 07/16/2012  . Essential hypertension 08/05/2011  . Murmur 08/05/2011  . 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 Dyslipidemia 02/04/2011   PAST MEDICAL HISTORY:  Active Ambulatory Problems    Diagnosis Date Noted  . 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 Dyslipidemia 02/04/2011  . Essential hypertension 08/05/2011  . Murmur 08/05/2011  . Cardiomyopathy, ischemic 07/16/2012  . Chronic kidney disease (CKD), stage III (moderate) ([place]) 07/16/2012  . Prostate CA ([place]) 09/04/2016  . Longstanding persistent atrial fibrillation ([place]) 09/04/2016  . Anticoagulated 09/19/2016  . Orthostatic hypotension 02/16/2017  . Fall at home, initial encounter 03/12/2017  .  Abdominal soreness 03/12/2017  . Generalized weakness 03/12/2017  . Leukocytosis 03/12/2017  . Hyponatremia 03/12/2017  . Lung nodule seen on imaging study: 7 mm nodule left midlung 03/13/2017  . Physical deconditioning 03/13/2017  . Normocytic anemia 03/13/2017  . Low back pain 03/24/2017  . Wrist pain, acute, right 05/25/2017  . Colon polyps 09/15/2017  . GERD (gastroesophageal reflux disease) 09/15/2017  . Frequent urination 01/01/2018  . Urolithiasis 01/05/2018  . Allergic rhinitis 04/26/2018  . Kidney stones 06/30/2018  . Pseudomonas urinary tract infection 08/05/2018  . AAA (abdominal aortic aneurysm) ([place]) 08/26/2018  . Weight loss 08/30/2019  . Onychomycosis 08/30/2019  . Fall 12/19/2019  . Protein-calorie malnutrition, severe 12/22/2019  . Near syncope   .  Iron deficiency anemia 01/17/2020  . History of UTI 02/14/2020  . Constipation 02/14/2020  . Closed left hip fracture ([place]) 09/02/2020  . Transaminitis 09/02/2020  . AF (paroxysmal atrial fibrillation) ([place]) 09/02/2020  . Severe sepsis ([place]) 09/10/2020  . Acute respiratory failure with hypoxia ([place]) 09/10/2020  . Aspiration pneumonia of both lower lobes due to gastric secretions ([place])   . Dysphagia   . Hypernatremia   . Hypokalemia   . Hypophosphatasia   . Pressure injury of skin 09/17/2020   Resolved Ambulatory Problems    Diagnosis Date Noted  . Acute congestive heart failure ([place]) 09/04/2016  . Blood in stool 09/19/2016  . Atrial fibrillation with RVR ([place]) 03/12/2017  . Acute cystitis without hematuria 03/24/2017  . Community acquired pneumonia of right lower lobe of lung 09/15/2017  . AKI (acute kidney injury) ([place]) 01/01/2018  . Complicated UTI (urinary tract infection) 01/05/2018  . Acute pyelonephritis   . Skin tear of elbow without complication 41/96/2229  . Hematuria 08/05/2018  . Pressure injury of skin 08/05/2018  . Pseudomonas aeruginosa infection 08/06/2018  . Acute cystitis with hematuria   . Hip fracture ([place]) 10/05/2018  . Intracranial hematoma ([place]) 10/05/2018  . Displaced fracture of right femoral neck ([place]) 10/07/2018  . Acute lower UTI 03/02/2019  . AMS (altered mental status) 03/02/2019  . Altered mental status 03/08/2019  . UTI (urinary tract infection) 04/25/2019  . Pyelonephritis 12/20/2019   Past Medical History:  Diagnosis Date  . (HFpEF) heart failure with preserved ejection fraction ([place])   . Arthritis   . Bladder calculus   . Chronic anemia   . Chronic anxiety   . CKD (chronic kidney disease), stage III ([place])   . Coronary artery disease CARDIOLOGIST-  DR Rockey Situ  . DOE (dyspnea on exertion)   . Frequent falls 11/2019  . Heart murmur   . History of bladder stone   . History of colon polyps   . History of non-ST elevation myocardial  infarction (NSTEMI) 03/30/2005  . History of pressure ulcer   . History of recurrent UTIs   . Hyperlipidemia   . Hyperplasia of prostate without lower urinary tract symptoms (LUTS)   . Hypertension   . Indwelling Foley catheter present   . Ischemic cardiomyopathy   . Left ureteral stone   . Lung nodule   . LVH (left ventricular hypertrophy) 09/16/2016  . Moderate mitral regurgitation   . PAC (premature atrial contraction)   . Paroxysmal atrial flutter ([place])   . Persistent atrial fibrillation [facility]) followed by cardiologist-- dr Rockey Situ  . Pneumonia   . Pulmonary hypertension ([place])   . Recurrent prostate cancer ([place]) UROLOGIST-- DR BELL  . Renal calculi   . S/P CABG x 5 04/04/2005  . Wears glasses  SOCIAL HX:  Social History   Tobacco Use  . Smoking status: Former Smoker    Years: 2.00    Types: Cigarettes    Quit date: 07/28/1960    Years since quitting: 60.3  . Smokeless tobacco: Never Used  Substance Use Topics  . Alcohol use: No   FAMILY HX:  Family History  Problem Relation Age of Onset  . Heart disease Mother   . Cancer Mother   . Cancer Father   . Cancer Brother       ALLERGIES:  Allergies  Allergen Reactions  . Ciprofloxacin Other (See Comments)    PER HEART DR  . Ibuprofen Other (See Comments)    Kidney issues     PERTINENT MEDICATIONS:  Outpatient Encounter Medications as of 11/02/2020  Medication Sig  . acetaminophen (TYLENOL) 500 MG tablet Take 500 mg by mouth every 8 (eight) hours as needed for moderate pain.  [name] acetaminophen (TYLENOL) 650 MG CR tablet Take 650 mg by mouth every 8 (eight) hours.  [name] apixaban (ELIQUIS) 5 MG TABS tablet Take 1 tablet (5 mg total) by mouth 2 (two) times daily.  . darolutamide (NUBEQA) 300 MG tablet Take 2 tablets (600 mg total) by mouth 2 (two) times daily.  . diclofenac Sodium (VOLTAREN) 1 % GEL Apply 1 application topically 3 (three) times daily as needed. For pain  . docusate (COLACE) 50 MG/5ML liquid Take 100 mg by  mouth 2 (two) times daily.  . ferrous sulfate 325 (65 FE) MG tablet Take 1 tablet (325 mg total) by mouth daily with breakfast.  . mirtazapine (REMERON SOL-TAB) 15 MG disintegrating tablet Take 15 mg by mouth at bedtime.  . Multiple Vitamin (MULTIVITAMIN WITH MINERALS) TABS tablet Take 1 tablet by mouth daily.  [name] omeprazole (PRILOSEC) 40 MG capsule TAKE 1 CAPSULE BY MOUTH EVERY DAY  . senna (SENOKOT) 8.6 MG TABS tablet Take 1 tablet by mouth in the morning and at bedtime.  . tamsulosin (FLOMAX) 0.4 MG CAPS capsule Take 1 capsule (0.4 mg total) by mouth daily after supper.  . [DISCONTINUED] acetaminophen (TYLENOL) 325 MG tablet Take 2 tablets (650 mg total) by mouth every 6 (six) hours as needed for mild pain (or Fever >/= 101).  . [DISCONTINUED] docusate sodium (COLACE) 100 MG capsule Take 1 capsule (100 mg total) by mouth 2 (two) times daily.  . [DISCONTINUED] mirtazapine (REMERON) 15 MG tablet Take 7.5 mg by mouth daily.  . [DISCONTINUED] mirtazapine (REMERON) 7.5 MG tablet Take 1 tablet (7.5 mg total) by mouth at bedtime.  . [DISCONTINUED] traMADol (ULTRAM) 50 MG tablet Take 1 tablet (50 mg total) by mouth every 6 (six) hours as needed for moderate pain.   No facility-administered encounter medications on file as of 11/02/2020.     ROS/staff General: weak ENMT: denies dysphagia Cardiovascular: denies chest pain now, denies DOE Pulmonary: denies cough, denies increased SOB Abdomen: endorses fair  appetite, endorses constipation, endorses incontinence of bowel GU: denies dysuria, endorses incontinence of urine MSK:  endorsesweakness,  no falls reported Skin: endorses PI of sacrum Neurological: endorses  pain, denies insomnia Psych: Endorses depressed mood Heme/lymph/immuno: denies bruises, abnormal bleeding  Physical Exam: Current and past weights: 129.5 loss, losing 10lbs / month,  180 lbs 6 mos ago, 30% weight loss in 6 months. Constitutional:  NAD General: frail appearing,  thin CV:  RRR, no LE edema Pulmonary:  no increased work of breathing, no cough, room air Abdomen: intake 50%,no ascites GU: deferred MSK: severe sarcopenia,  Cannot move all extremities, nonambulatory Skin: warm and dry, PI on sacrum, not visualized Neuro:  ++generalized weakness,  moderate cognitive impairment Psych: non-anxious affect, A and O x 1-2 Hem/lymph/immuno: no widespread bruising  Thank you for the opportunity to participate in the care of Mr. Popper.  The palliative care team will continue to follow. Please call our office at [phone] if we can be of additional assistance.   Jason Coop, NP , DNP, MPH, AGPCNP-BC, ACHPN   COVID-19 PATIENT SCREENING TOOL Asked and negative response unless otherwise noted:   Have you had symptoms of covid, tested positive or been in contact with someone with symptoms/positive test in the past 5-10 days?

## 2020-11-03 DIAGNOSIS — Z736 Limitation of activities due to disability: Secondary | ICD-10-CM | POA: Diagnosis not present

## 2020-11-03 DIAGNOSIS — R2689 Other abnormalities of gait and mobility: Secondary | ICD-10-CM | POA: Diagnosis not present

## 2020-11-03 DIAGNOSIS — R488 Other symbolic dysfunctions: Secondary | ICD-10-CM | POA: Diagnosis not present

## 2020-11-03 DIAGNOSIS — R1312 Dysphagia, oropharyngeal phase: Secondary | ICD-10-CM | POA: Diagnosis not present

## 2020-11-03 DIAGNOSIS — M6281 Muscle weakness (generalized): Secondary | ICD-10-CM | POA: Diagnosis not present

## 2020-11-03 DIAGNOSIS — Z9181 History of falling: Secondary | ICD-10-CM | POA: Diagnosis not present

## 2020-11-05 DIAGNOSIS — Z9181 History of falling: Secondary | ICD-10-CM | POA: Diagnosis not present

## 2020-11-05 DIAGNOSIS — M6281 Muscle weakness (generalized): Secondary | ICD-10-CM | POA: Diagnosis not present

## 2020-11-05 DIAGNOSIS — R1312 Dysphagia, oropharyngeal phase: Secondary | ICD-10-CM | POA: Diagnosis not present

## 2020-11-05 DIAGNOSIS — D509 Iron deficiency anemia, unspecified: Secondary | ICD-10-CM | POA: Diagnosis not present

## 2020-11-05 DIAGNOSIS — R2689 Other abnormalities of gait and mobility: Secondary | ICD-10-CM | POA: Diagnosis not present

## 2020-11-05 DIAGNOSIS — R488 Other symbolic dysfunctions: Secondary | ICD-10-CM | POA: Diagnosis not present

## 2020-11-05 DIAGNOSIS — Z736 Limitation of activities due to disability: Secondary | ICD-10-CM | POA: Diagnosis not present

## 2020-11-06 DIAGNOSIS — R2689 Other abnormalities of gait and mobility: Secondary | ICD-10-CM | POA: Diagnosis not present

## 2020-11-06 DIAGNOSIS — R488 Other symbolic dysfunctions: Secondary | ICD-10-CM | POA: Diagnosis not present

## 2020-11-06 DIAGNOSIS — M6281 Muscle weakness (generalized): Secondary | ICD-10-CM | POA: Diagnosis not present

## 2020-11-06 DIAGNOSIS — Z736 Limitation of activities due to disability: Secondary | ICD-10-CM | POA: Diagnosis not present

## 2020-11-06 DIAGNOSIS — R1312 Dysphagia, oropharyngeal phase: Secondary | ICD-10-CM | POA: Diagnosis not present

## 2020-11-06 DIAGNOSIS — Z9181 History of falling: Secondary | ICD-10-CM | POA: Diagnosis not present

## 2020-11-07 DIAGNOSIS — R2689 Other abnormalities of gait and mobility: Secondary | ICD-10-CM | POA: Diagnosis not present

## 2020-11-07 DIAGNOSIS — D509 Iron deficiency anemia, unspecified: Secondary | ICD-10-CM | POA: Diagnosis not present

## 2020-11-07 DIAGNOSIS — Z9181 History of falling: Secondary | ICD-10-CM | POA: Diagnosis not present

## 2020-11-07 DIAGNOSIS — M6281 Muscle weakness (generalized): Secondary | ICD-10-CM | POA: Diagnosis not present

## 2020-11-07 DIAGNOSIS — R1312 Dysphagia, oropharyngeal phase: Secondary | ICD-10-CM | POA: Diagnosis not present

## 2020-11-07 DIAGNOSIS — Z736 Limitation of activities due to disability: Secondary | ICD-10-CM | POA: Diagnosis not present

## 2020-11-07 DIAGNOSIS — R488 Other symbolic dysfunctions: Secondary | ICD-10-CM | POA: Diagnosis not present

## 2020-11-08 DIAGNOSIS — R1312 Dysphagia, oropharyngeal phase: Secondary | ICD-10-CM | POA: Diagnosis not present

## 2020-11-08 DIAGNOSIS — R5381 Other malaise: Secondary | ICD-10-CM | POA: Diagnosis not present

## 2020-11-08 DIAGNOSIS — I5032 Chronic diastolic (congestive) heart failure: Secondary | ICD-10-CM | POA: Diagnosis not present

## 2020-11-08 DIAGNOSIS — I480 Paroxysmal atrial fibrillation: Secondary | ICD-10-CM | POA: Diagnosis not present

## 2020-11-08 DIAGNOSIS — R488 Other symbolic dysfunctions: Secondary | ICD-10-CM | POA: Diagnosis not present

## 2020-11-08 DIAGNOSIS — S72002A Fracture of unspecified part of neck of left femur, initial encounter for closed fracture: Secondary | ICD-10-CM | POA: Diagnosis not present

## 2020-11-08 DIAGNOSIS — Z736 Limitation of activities due to disability: Secondary | ICD-10-CM | POA: Diagnosis not present

## 2020-11-08 DIAGNOSIS — R2689 Other abnormalities of gait and mobility: Secondary | ICD-10-CM | POA: Diagnosis not present

## 2020-11-08 DIAGNOSIS — M6281 Muscle weakness (generalized): Secondary | ICD-10-CM | POA: Diagnosis not present

## 2020-11-08 DIAGNOSIS — Z9181 History of falling: Secondary | ICD-10-CM | POA: Diagnosis not present

## 2020-11-09 DIAGNOSIS — R488 Other symbolic dysfunctions: Secondary | ICD-10-CM | POA: Diagnosis not present

## 2020-11-09 DIAGNOSIS — Z736 Limitation of activities due to disability: Secondary | ICD-10-CM | POA: Diagnosis not present

## 2020-11-09 DIAGNOSIS — Z9181 History of falling: Secondary | ICD-10-CM | POA: Diagnosis not present

## 2020-11-09 DIAGNOSIS — R1312 Dysphagia, oropharyngeal phase: Secondary | ICD-10-CM | POA: Diagnosis not present

## 2020-11-09 DIAGNOSIS — R2689 Other abnormalities of gait and mobility: Secondary | ICD-10-CM | POA: Diagnosis not present

## 2020-11-09 DIAGNOSIS — M6281 Muscle weakness (generalized): Secondary | ICD-10-CM | POA: Diagnosis not present

## 2020-11-10 DIAGNOSIS — R2689 Other abnormalities of gait and mobility: Secondary | ICD-10-CM | POA: Diagnosis not present

## 2020-11-10 DIAGNOSIS — R488 Other symbolic dysfunctions: Secondary | ICD-10-CM | POA: Diagnosis not present

## 2020-11-10 DIAGNOSIS — R1312 Dysphagia, oropharyngeal phase: Secondary | ICD-10-CM | POA: Diagnosis not present

## 2020-11-10 DIAGNOSIS — Z9181 History of falling: Secondary | ICD-10-CM | POA: Diagnosis not present

## 2020-11-10 DIAGNOSIS — M6281 Muscle weakness (generalized): Secondary | ICD-10-CM | POA: Diagnosis not present

## 2020-11-10 DIAGNOSIS — Z736 Limitation of activities due to disability: Secondary | ICD-10-CM | POA: Diagnosis not present

## 2020-11-12 DIAGNOSIS — Z9181 History of falling: Secondary | ICD-10-CM | POA: Diagnosis not present

## 2020-11-12 DIAGNOSIS — Z736 Limitation of activities due to disability: Secondary | ICD-10-CM | POA: Diagnosis not present

## 2020-11-12 DIAGNOSIS — R488 Other symbolic dysfunctions: Secondary | ICD-10-CM | POA: Diagnosis not present

## 2020-11-12 DIAGNOSIS — M6281 Muscle weakness (generalized): Secondary | ICD-10-CM | POA: Diagnosis not present

## 2020-11-12 DIAGNOSIS — R2689 Other abnormalities of gait and mobility: Secondary | ICD-10-CM | POA: Diagnosis not present

## 2020-11-12 DIAGNOSIS — R1312 Dysphagia, oropharyngeal phase: Secondary | ICD-10-CM | POA: Diagnosis not present

## 2020-11-13 DIAGNOSIS — M6281 Muscle weakness (generalized): Secondary | ICD-10-CM | POA: Diagnosis not present

## 2020-11-13 DIAGNOSIS — Z9181 History of falling: Secondary | ICD-10-CM | POA: Diagnosis not present

## 2020-11-13 DIAGNOSIS — R2689 Other abnormalities of gait and mobility: Secondary | ICD-10-CM | POA: Diagnosis not present

## 2020-11-13 DIAGNOSIS — R488 Other symbolic dysfunctions: Secondary | ICD-10-CM | POA: Diagnosis not present

## 2020-11-13 DIAGNOSIS — Z736 Limitation of activities due to disability: Secondary | ICD-10-CM | POA: Diagnosis not present

## 2020-11-13 DIAGNOSIS — R1312 Dysphagia, oropharyngeal phase: Secondary | ICD-10-CM | POA: Diagnosis not present

## 2020-11-14 DIAGNOSIS — R2689 Other abnormalities of gait and mobility: Secondary | ICD-10-CM | POA: Diagnosis not present

## 2020-11-14 DIAGNOSIS — M6281 Muscle weakness (generalized): Secondary | ICD-10-CM | POA: Diagnosis not present

## 2020-11-14 DIAGNOSIS — Z9181 History of falling: Secondary | ICD-10-CM | POA: Diagnosis not present

## 2020-11-14 DIAGNOSIS — Z736 Limitation of activities due to disability: Secondary | ICD-10-CM | POA: Diagnosis not present

## 2020-11-14 DIAGNOSIS — R488 Other symbolic dysfunctions: Secondary | ICD-10-CM | POA: Diagnosis not present

## 2020-11-14 DIAGNOSIS — R1312 Dysphagia, oropharyngeal phase: Secondary | ICD-10-CM | POA: Diagnosis not present

## 2020-11-15 DIAGNOSIS — M818 Other osteoporosis without current pathological fracture: Secondary | ICD-10-CM | POA: Diagnosis not present

## 2020-11-15 DIAGNOSIS — Z9181 History of falling: Secondary | ICD-10-CM | POA: Diagnosis not present

## 2020-11-15 DIAGNOSIS — R2689 Other abnormalities of gait and mobility: Secondary | ICD-10-CM | POA: Diagnosis not present

## 2020-11-15 DIAGNOSIS — M6281 Muscle weakness (generalized): Secondary | ICD-10-CM | POA: Diagnosis not present

## 2020-11-15 DIAGNOSIS — R488 Other symbolic dysfunctions: Secondary | ICD-10-CM | POA: Diagnosis not present

## 2020-11-15 DIAGNOSIS — Z736 Limitation of activities due to disability: Secondary | ICD-10-CM | POA: Diagnosis not present

## 2020-11-15 DIAGNOSIS — R1312 Dysphagia, oropharyngeal phase: Secondary | ICD-10-CM | POA: Diagnosis not present

## 2020-11-16 DIAGNOSIS — R1312 Dysphagia, oropharyngeal phase: Secondary | ICD-10-CM | POA: Diagnosis not present

## 2020-11-16 DIAGNOSIS — Z736 Limitation of activities due to disability: Secondary | ICD-10-CM | POA: Diagnosis not present

## 2020-11-16 DIAGNOSIS — Z9181 History of falling: Secondary | ICD-10-CM | POA: Diagnosis not present

## 2020-11-16 DIAGNOSIS — R488 Other symbolic dysfunctions: Secondary | ICD-10-CM | POA: Diagnosis not present

## 2020-11-16 DIAGNOSIS — R2689 Other abnormalities of gait and mobility: Secondary | ICD-10-CM | POA: Diagnosis not present

## 2020-11-16 DIAGNOSIS — M6281 Muscle weakness (generalized): Secondary | ICD-10-CM | POA: Diagnosis not present

## 2020-11-17 DIAGNOSIS — Z736 Limitation of activities due to disability: Secondary | ICD-10-CM | POA: Diagnosis not present

## 2020-11-17 DIAGNOSIS — M6281 Muscle weakness (generalized): Secondary | ICD-10-CM | POA: Diagnosis not present

## 2020-11-17 DIAGNOSIS — R2689 Other abnormalities of gait and mobility: Secondary | ICD-10-CM | POA: Diagnosis not present

## 2020-11-17 DIAGNOSIS — R1312 Dysphagia, oropharyngeal phase: Secondary | ICD-10-CM | POA: Diagnosis not present

## 2020-11-17 DIAGNOSIS — R488 Other symbolic dysfunctions: Secondary | ICD-10-CM | POA: Diagnosis not present

## 2020-11-17 DIAGNOSIS — Z9181 History of falling: Secondary | ICD-10-CM | POA: Diagnosis not present

## 2020-11-19 DIAGNOSIS — R2689 Other abnormalities of gait and mobility: Secondary | ICD-10-CM | POA: Diagnosis not present

## 2020-11-19 DIAGNOSIS — R1312 Dysphagia, oropharyngeal phase: Secondary | ICD-10-CM | POA: Diagnosis not present

## 2020-11-19 DIAGNOSIS — Z9181 History of falling: Secondary | ICD-10-CM | POA: Diagnosis not present

## 2020-11-19 DIAGNOSIS — R488 Other symbolic dysfunctions: Secondary | ICD-10-CM | POA: Diagnosis not present

## 2020-11-19 DIAGNOSIS — Z736 Limitation of activities due to disability: Secondary | ICD-10-CM | POA: Diagnosis not present

## 2020-11-19 DIAGNOSIS — M6281 Muscle weakness (generalized): Secondary | ICD-10-CM | POA: Diagnosis not present

## 2020-11-20 DIAGNOSIS — R488 Other symbolic dysfunctions: Secondary | ICD-10-CM | POA: Diagnosis not present

## 2020-11-20 DIAGNOSIS — Z9181 History of falling: Secondary | ICD-10-CM | POA: Diagnosis not present

## 2020-11-20 DIAGNOSIS — R1312 Dysphagia, oropharyngeal phase: Secondary | ICD-10-CM | POA: Diagnosis not present

## 2020-11-20 DIAGNOSIS — M6281 Muscle weakness (generalized): Secondary | ICD-10-CM | POA: Diagnosis not present

## 2020-11-20 DIAGNOSIS — R2689 Other abnormalities of gait and mobility: Secondary | ICD-10-CM | POA: Diagnosis not present

## 2020-11-20 DIAGNOSIS — Z736 Limitation of activities due to disability: Secondary | ICD-10-CM | POA: Diagnosis not present

## 2020-11-21 DIAGNOSIS — Z736 Limitation of activities due to disability: Secondary | ICD-10-CM | POA: Diagnosis not present

## 2020-11-21 DIAGNOSIS — R2689 Other abnormalities of gait and mobility: Secondary | ICD-10-CM | POA: Diagnosis not present

## 2020-11-21 DIAGNOSIS — M6281 Muscle weakness (generalized): Secondary | ICD-10-CM | POA: Diagnosis not present

## 2020-11-21 DIAGNOSIS — R488 Other symbolic dysfunctions: Secondary | ICD-10-CM | POA: Diagnosis not present

## 2020-11-21 DIAGNOSIS — Z9181 History of falling: Secondary | ICD-10-CM | POA: Diagnosis not present

## 2020-11-21 DIAGNOSIS — R1312 Dysphagia, oropharyngeal phase: Secondary | ICD-10-CM | POA: Diagnosis not present

## 2020-11-22 DIAGNOSIS — Z9181 History of falling: Secondary | ICD-10-CM | POA: Diagnosis not present

## 2020-11-22 DIAGNOSIS — R1312 Dysphagia, oropharyngeal phase: Secondary | ICD-10-CM | POA: Diagnosis not present

## 2020-11-22 DIAGNOSIS — Z736 Limitation of activities due to disability: Secondary | ICD-10-CM | POA: Diagnosis not present

## 2020-11-22 DIAGNOSIS — R2689 Other abnormalities of gait and mobility: Secondary | ICD-10-CM | POA: Diagnosis not present

## 2020-11-22 DIAGNOSIS — M6281 Muscle weakness (generalized): Secondary | ICD-10-CM | POA: Diagnosis not present

## 2020-11-22 DIAGNOSIS — R488 Other symbolic dysfunctions: Secondary | ICD-10-CM | POA: Diagnosis not present

## 2020-11-23 DIAGNOSIS — M6281 Muscle weakness (generalized): Secondary | ICD-10-CM | POA: Diagnosis not present

## 2020-11-23 DIAGNOSIS — Z736 Limitation of activities due to disability: Secondary | ICD-10-CM | POA: Diagnosis not present

## 2020-11-23 DIAGNOSIS — R2689 Other abnormalities of gait and mobility: Secondary | ICD-10-CM | POA: Diagnosis not present

## 2020-11-23 DIAGNOSIS — Z9181 History of falling: Secondary | ICD-10-CM | POA: Diagnosis not present

## 2020-11-23 DIAGNOSIS — R1312 Dysphagia, oropharyngeal phase: Secondary | ICD-10-CM | POA: Diagnosis not present

## 2020-11-23 DIAGNOSIS — R488 Other symbolic dysfunctions: Secondary | ICD-10-CM | POA: Diagnosis not present

## 2020-11-26 DIAGNOSIS — R2689 Other abnormalities of gait and mobility: Secondary | ICD-10-CM | POA: Diagnosis not present

## 2020-11-26 DIAGNOSIS — J690 Pneumonitis due to inhalation of food and vomit: Secondary | ICD-10-CM | POA: Diagnosis not present

## 2020-11-26 DIAGNOSIS — Z736 Limitation of activities due to disability: Secondary | ICD-10-CM | POA: Diagnosis not present

## 2020-11-26 DIAGNOSIS — M6281 Muscle weakness (generalized): Secondary | ICD-10-CM | POA: Diagnosis not present

## 2020-11-26 DIAGNOSIS — Z9181 History of falling: Secondary | ICD-10-CM | POA: Diagnosis not present

## 2020-11-26 DIAGNOSIS — R1312 Dysphagia, oropharyngeal phase: Secondary | ICD-10-CM | POA: Diagnosis not present

## 2020-11-26 DIAGNOSIS — R488 Other symbolic dysfunctions: Secondary | ICD-10-CM | POA: Diagnosis not present

## 2020-11-27 DIAGNOSIS — J690 Pneumonitis due to inhalation of food and vomit: Secondary | ICD-10-CM | POA: Diagnosis not present

## 2020-11-27 DIAGNOSIS — Z9181 History of falling: Secondary | ICD-10-CM | POA: Diagnosis not present

## 2020-11-27 DIAGNOSIS — Z736 Limitation of activities due to disability: Secondary | ICD-10-CM | POA: Diagnosis not present

## 2020-11-27 DIAGNOSIS — R1312 Dysphagia, oropharyngeal phase: Secondary | ICD-10-CM | POA: Diagnosis not present

## 2020-11-27 DIAGNOSIS — M6281 Muscle weakness (generalized): Secondary | ICD-10-CM | POA: Diagnosis not present

## 2020-11-27 DIAGNOSIS — R2689 Other abnormalities of gait and mobility: Secondary | ICD-10-CM | POA: Diagnosis not present

## 2020-11-27 DIAGNOSIS — R488 Other symbolic dysfunctions: Secondary | ICD-10-CM | POA: Diagnosis not present

## 2020-11-28 DIAGNOSIS — J690 Pneumonitis due to inhalation of food and vomit: Secondary | ICD-10-CM | POA: Diagnosis not present

## 2020-11-28 DIAGNOSIS — R488 Other symbolic dysfunctions: Secondary | ICD-10-CM | POA: Diagnosis not present

## 2020-11-28 DIAGNOSIS — M6281 Muscle weakness (generalized): Secondary | ICD-10-CM | POA: Diagnosis not present

## 2020-11-28 DIAGNOSIS — R1312 Dysphagia, oropharyngeal phase: Secondary | ICD-10-CM | POA: Diagnosis not present

## 2020-11-28 DIAGNOSIS — Z736 Limitation of activities due to disability: Secondary | ICD-10-CM | POA: Diagnosis not present

## 2020-11-28 DIAGNOSIS — R2689 Other abnormalities of gait and mobility: Secondary | ICD-10-CM | POA: Diagnosis not present

## 2020-11-28 DIAGNOSIS — Z9181 History of falling: Secondary | ICD-10-CM | POA: Diagnosis not present

## 2020-11-29 DIAGNOSIS — M6281 Muscle weakness (generalized): Secondary | ICD-10-CM | POA: Diagnosis not present

## 2020-11-29 DIAGNOSIS — J690 Pneumonitis due to inhalation of food and vomit: Secondary | ICD-10-CM | POA: Diagnosis not present

## 2020-11-29 DIAGNOSIS — R2689 Other abnormalities of gait and mobility: Secondary | ICD-10-CM | POA: Diagnosis not present

## 2020-11-29 DIAGNOSIS — Z736 Limitation of activities due to disability: Secondary | ICD-10-CM | POA: Diagnosis not present

## 2020-11-29 DIAGNOSIS — R488 Other symbolic dysfunctions: Secondary | ICD-10-CM | POA: Diagnosis not present

## 2020-11-29 DIAGNOSIS — Z9181 History of falling: Secondary | ICD-10-CM | POA: Diagnosis not present

## 2020-11-29 DIAGNOSIS — R1312 Dysphagia, oropharyngeal phase: Secondary | ICD-10-CM | POA: Diagnosis not present

## 2020-11-30 DIAGNOSIS — Z736 Limitation of activities due to disability: Secondary | ICD-10-CM | POA: Diagnosis not present

## 2020-11-30 DIAGNOSIS — R488 Other symbolic dysfunctions: Secondary | ICD-10-CM | POA: Diagnosis not present

## 2020-11-30 DIAGNOSIS — R2689 Other abnormalities of gait and mobility: Secondary | ICD-10-CM | POA: Diagnosis not present

## 2020-11-30 DIAGNOSIS — M6281 Muscle weakness (generalized): Secondary | ICD-10-CM | POA: Diagnosis not present

## 2020-11-30 DIAGNOSIS — R1312 Dysphagia, oropharyngeal phase: Secondary | ICD-10-CM | POA: Diagnosis not present

## 2020-11-30 DIAGNOSIS — J690 Pneumonitis due to inhalation of food and vomit: Secondary | ICD-10-CM | POA: Diagnosis not present

## 2020-11-30 DIAGNOSIS — Z9181 History of falling: Secondary | ICD-10-CM | POA: Diagnosis not present

## 2020-12-01 DIAGNOSIS — R2689 Other abnormalities of gait and mobility: Secondary | ICD-10-CM | POA: Diagnosis not present

## 2020-12-01 DIAGNOSIS — J690 Pneumonitis due to inhalation of food and vomit: Secondary | ICD-10-CM | POA: Diagnosis not present

## 2020-12-01 DIAGNOSIS — Z9181 History of falling: Secondary | ICD-10-CM | POA: Diagnosis not present

## 2020-12-01 DIAGNOSIS — R488 Other symbolic dysfunctions: Secondary | ICD-10-CM | POA: Diagnosis not present

## 2020-12-01 DIAGNOSIS — M6281 Muscle weakness (generalized): Secondary | ICD-10-CM | POA: Diagnosis not present

## 2020-12-01 DIAGNOSIS — R1312 Dysphagia, oropharyngeal phase: Secondary | ICD-10-CM | POA: Diagnosis not present

## 2020-12-01 DIAGNOSIS — Z736 Limitation of activities due to disability: Secondary | ICD-10-CM | POA: Diagnosis not present

## 2020-12-03 DIAGNOSIS — M6281 Muscle weakness (generalized): Secondary | ICD-10-CM | POA: Diagnosis not present

## 2020-12-03 DIAGNOSIS — R488 Other symbolic dysfunctions: Secondary | ICD-10-CM | POA: Diagnosis not present

## 2020-12-03 DIAGNOSIS — R1312 Dysphagia, oropharyngeal phase: Secondary | ICD-10-CM | POA: Diagnosis not present

## 2020-12-03 DIAGNOSIS — J690 Pneumonitis due to inhalation of food and vomit: Secondary | ICD-10-CM | POA: Diagnosis not present

## 2020-12-03 DIAGNOSIS — Z736 Limitation of activities due to disability: Secondary | ICD-10-CM | POA: Diagnosis not present

## 2020-12-03 DIAGNOSIS — R2689 Other abnormalities of gait and mobility: Secondary | ICD-10-CM | POA: Diagnosis not present

## 2020-12-03 DIAGNOSIS — Z9181 History of falling: Secondary | ICD-10-CM | POA: Diagnosis not present

## 2020-12-04 DIAGNOSIS — Z736 Limitation of activities due to disability: Secondary | ICD-10-CM | POA: Diagnosis not present

## 2020-12-04 DIAGNOSIS — M6281 Muscle weakness (generalized): Secondary | ICD-10-CM | POA: Diagnosis not present

## 2020-12-04 DIAGNOSIS — R488 Other symbolic dysfunctions: Secondary | ICD-10-CM | POA: Diagnosis not present

## 2020-12-04 DIAGNOSIS — R1312 Dysphagia, oropharyngeal phase: Secondary | ICD-10-CM | POA: Diagnosis not present

## 2020-12-04 DIAGNOSIS — J690 Pneumonitis due to inhalation of food and vomit: Secondary | ICD-10-CM | POA: Diagnosis not present

## 2020-12-04 DIAGNOSIS — R2689 Other abnormalities of gait and mobility: Secondary | ICD-10-CM | POA: Diagnosis not present

## 2020-12-04 DIAGNOSIS — Z9181 History of falling: Secondary | ICD-10-CM | POA: Diagnosis not present

## 2020-12-05 DIAGNOSIS — M6281 Muscle weakness (generalized): Secondary | ICD-10-CM | POA: Diagnosis not present

## 2020-12-05 DIAGNOSIS — R1312 Dysphagia, oropharyngeal phase: Secondary | ICD-10-CM | POA: Diagnosis not present

## 2020-12-05 DIAGNOSIS — Z736 Limitation of activities due to disability: Secondary | ICD-10-CM | POA: Diagnosis not present

## 2020-12-05 DIAGNOSIS — R488 Other symbolic dysfunctions: Secondary | ICD-10-CM | POA: Diagnosis not present

## 2020-12-05 DIAGNOSIS — Z9181 History of falling: Secondary | ICD-10-CM | POA: Diagnosis not present

## 2020-12-05 DIAGNOSIS — R2689 Other abnormalities of gait and mobility: Secondary | ICD-10-CM | POA: Diagnosis not present

## 2020-12-05 DIAGNOSIS — J690 Pneumonitis due to inhalation of food and vomit: Secondary | ICD-10-CM | POA: Diagnosis not present

## 2020-12-06 DIAGNOSIS — R488 Other symbolic dysfunctions: Secondary | ICD-10-CM | POA: Diagnosis not present

## 2020-12-06 DIAGNOSIS — Z736 Limitation of activities due to disability: Secondary | ICD-10-CM | POA: Diagnosis not present

## 2020-12-06 DIAGNOSIS — M6281 Muscle weakness (generalized): Secondary | ICD-10-CM | POA: Diagnosis not present

## 2020-12-06 DIAGNOSIS — J690 Pneumonitis due to inhalation of food and vomit: Secondary | ICD-10-CM | POA: Diagnosis not present

## 2020-12-06 DIAGNOSIS — R1312 Dysphagia, oropharyngeal phase: Secondary | ICD-10-CM | POA: Diagnosis not present

## 2020-12-06 DIAGNOSIS — Z9181 History of falling: Secondary | ICD-10-CM | POA: Diagnosis not present

## 2020-12-06 DIAGNOSIS — R2689 Other abnormalities of gait and mobility: Secondary | ICD-10-CM | POA: Diagnosis not present

## 2020-12-07 DIAGNOSIS — R1312 Dysphagia, oropharyngeal phase: Secondary | ICD-10-CM | POA: Diagnosis not present

## 2020-12-07 DIAGNOSIS — J690 Pneumonitis due to inhalation of food and vomit: Secondary | ICD-10-CM | POA: Diagnosis not present

## 2020-12-07 DIAGNOSIS — Z9181 History of falling: Secondary | ICD-10-CM | POA: Diagnosis not present

## 2020-12-07 DIAGNOSIS — Z736 Limitation of activities due to disability: Secondary | ICD-10-CM | POA: Diagnosis not present

## 2020-12-07 DIAGNOSIS — R2689 Other abnormalities of gait and mobility: Secondary | ICD-10-CM | POA: Diagnosis not present

## 2020-12-07 DIAGNOSIS — R488 Other symbolic dysfunctions: Secondary | ICD-10-CM | POA: Diagnosis not present

## 2020-12-07 DIAGNOSIS — M6281 Muscle weakness (generalized): Secondary | ICD-10-CM | POA: Diagnosis not present

## 2020-12-09 DIAGNOSIS — R488 Other symbolic dysfunctions: Secondary | ICD-10-CM | POA: Diagnosis not present

## 2020-12-09 DIAGNOSIS — J690 Pneumonitis due to inhalation of food and vomit: Secondary | ICD-10-CM | POA: Diagnosis not present

## 2020-12-09 DIAGNOSIS — M6281 Muscle weakness (generalized): Secondary | ICD-10-CM | POA: Diagnosis not present

## 2020-12-09 DIAGNOSIS — R1312 Dysphagia, oropharyngeal phase: Secondary | ICD-10-CM | POA: Diagnosis not present

## 2020-12-09 DIAGNOSIS — R2689 Other abnormalities of gait and mobility: Secondary | ICD-10-CM | POA: Diagnosis not present

## 2020-12-09 DIAGNOSIS — Z736 Limitation of activities due to disability: Secondary | ICD-10-CM | POA: Diagnosis not present

## 2020-12-09 DIAGNOSIS — Z9181 History of falling: Secondary | ICD-10-CM | POA: Diagnosis not present

## 2020-12-10 DIAGNOSIS — R1312 Dysphagia, oropharyngeal phase: Secondary | ICD-10-CM | POA: Diagnosis not present

## 2020-12-10 DIAGNOSIS — Z736 Limitation of activities due to disability: Secondary | ICD-10-CM | POA: Diagnosis not present

## 2020-12-10 DIAGNOSIS — Z9181 History of falling: Secondary | ICD-10-CM | POA: Diagnosis not present

## 2020-12-10 DIAGNOSIS — R488 Other symbolic dysfunctions: Secondary | ICD-10-CM | POA: Diagnosis not present

## 2020-12-10 DIAGNOSIS — M6281 Muscle weakness (generalized): Secondary | ICD-10-CM | POA: Diagnosis not present

## 2020-12-10 DIAGNOSIS — R2689 Other abnormalities of gait and mobility: Secondary | ICD-10-CM | POA: Diagnosis not present

## 2020-12-10 DIAGNOSIS — J690 Pneumonitis due to inhalation of food and vomit: Secondary | ICD-10-CM | POA: Diagnosis not present

## 2020-12-11 DIAGNOSIS — Z9181 History of falling: Secondary | ICD-10-CM | POA: Diagnosis not present

## 2020-12-11 DIAGNOSIS — M6281 Muscle weakness (generalized): Secondary | ICD-10-CM | POA: Diagnosis not present

## 2020-12-11 DIAGNOSIS — R1312 Dysphagia, oropharyngeal phase: Secondary | ICD-10-CM | POA: Diagnosis not present

## 2020-12-11 DIAGNOSIS — J690 Pneumonitis due to inhalation of food and vomit: Secondary | ICD-10-CM | POA: Diagnosis not present

## 2020-12-11 DIAGNOSIS — R488 Other symbolic dysfunctions: Secondary | ICD-10-CM | POA: Diagnosis not present

## 2020-12-11 DIAGNOSIS — Z736 Limitation of activities due to disability: Secondary | ICD-10-CM | POA: Diagnosis not present

## 2020-12-11 DIAGNOSIS — R2689 Other abnormalities of gait and mobility: Secondary | ICD-10-CM | POA: Diagnosis not present

## 2020-12-12 DIAGNOSIS — Z9181 History of falling: Secondary | ICD-10-CM | POA: Diagnosis not present

## 2020-12-12 DIAGNOSIS — J690 Pneumonitis due to inhalation of food and vomit: Secondary | ICD-10-CM | POA: Diagnosis not present

## 2020-12-12 DIAGNOSIS — R2689 Other abnormalities of gait and mobility: Secondary | ICD-10-CM | POA: Diagnosis not present

## 2020-12-12 DIAGNOSIS — R1312 Dysphagia, oropharyngeal phase: Secondary | ICD-10-CM | POA: Diagnosis not present

## 2020-12-12 DIAGNOSIS — M6281 Muscle weakness (generalized): Secondary | ICD-10-CM | POA: Diagnosis not present

## 2020-12-12 DIAGNOSIS — Z736 Limitation of activities due to disability: Secondary | ICD-10-CM | POA: Diagnosis not present

## 2020-12-12 DIAGNOSIS — R488 Other symbolic dysfunctions: Secondary | ICD-10-CM | POA: Diagnosis not present

## 2020-12-13 DIAGNOSIS — Z9181 History of falling: Secondary | ICD-10-CM | POA: Diagnosis not present

## 2020-12-13 DIAGNOSIS — J690 Pneumonitis due to inhalation of food and vomit: Secondary | ICD-10-CM | POA: Diagnosis not present

## 2020-12-13 DIAGNOSIS — Z736 Limitation of activities due to disability: Secondary | ICD-10-CM | POA: Diagnosis not present

## 2020-12-13 DIAGNOSIS — R488 Other symbolic dysfunctions: Secondary | ICD-10-CM | POA: Diagnosis not present

## 2020-12-13 DIAGNOSIS — R2689 Other abnormalities of gait and mobility: Secondary | ICD-10-CM | POA: Diagnosis not present

## 2020-12-13 DIAGNOSIS — M6281 Muscle weakness (generalized): Secondary | ICD-10-CM | POA: Diagnosis not present

## 2020-12-13 DIAGNOSIS — R1312 Dysphagia, oropharyngeal phase: Secondary | ICD-10-CM | POA: Diagnosis not present

## 2020-12-14 DIAGNOSIS — R488 Other symbolic dysfunctions: Secondary | ICD-10-CM | POA: Diagnosis not present

## 2020-12-14 DIAGNOSIS — R1312 Dysphagia, oropharyngeal phase: Secondary | ICD-10-CM | POA: Diagnosis not present

## 2020-12-14 DIAGNOSIS — Z736 Limitation of activities due to disability: Secondary | ICD-10-CM | POA: Diagnosis not present

## 2020-12-14 DIAGNOSIS — J690 Pneumonitis due to inhalation of food and vomit: Secondary | ICD-10-CM | POA: Diagnosis not present

## 2020-12-14 DIAGNOSIS — Z9181 History of falling: Secondary | ICD-10-CM | POA: Diagnosis not present

## 2020-12-14 DIAGNOSIS — M6281 Muscle weakness (generalized): Secondary | ICD-10-CM | POA: Diagnosis not present

## 2020-12-14 DIAGNOSIS — R2689 Other abnormalities of gait and mobility: Secondary | ICD-10-CM | POA: Diagnosis not present

## 2020-12-15 DIAGNOSIS — R2689 Other abnormalities of gait and mobility: Secondary | ICD-10-CM | POA: Diagnosis not present

## 2020-12-15 DIAGNOSIS — Z736 Limitation of activities due to disability: Secondary | ICD-10-CM | POA: Diagnosis not present

## 2020-12-15 DIAGNOSIS — J690 Pneumonitis due to inhalation of food and vomit: Secondary | ICD-10-CM | POA: Diagnosis not present

## 2020-12-15 DIAGNOSIS — R1312 Dysphagia, oropharyngeal phase: Secondary | ICD-10-CM | POA: Diagnosis not present

## 2020-12-15 DIAGNOSIS — M6281 Muscle weakness (generalized): Secondary | ICD-10-CM | POA: Diagnosis not present

## 2020-12-15 DIAGNOSIS — R488 Other symbolic dysfunctions: Secondary | ICD-10-CM | POA: Diagnosis not present

## 2020-12-15 DIAGNOSIS — Z9181 History of falling: Secondary | ICD-10-CM | POA: Diagnosis not present

## 2020-12-16 DIAGNOSIS — Z9181 History of falling: Secondary | ICD-10-CM | POA: Diagnosis not present

## 2020-12-16 DIAGNOSIS — J690 Pneumonitis due to inhalation of food and vomit: Secondary | ICD-10-CM | POA: Diagnosis not present

## 2020-12-16 DIAGNOSIS — R488 Other symbolic dysfunctions: Secondary | ICD-10-CM | POA: Diagnosis not present

## 2020-12-16 DIAGNOSIS — R1312 Dysphagia, oropharyngeal phase: Secondary | ICD-10-CM | POA: Diagnosis not present

## 2020-12-16 DIAGNOSIS — R2689 Other abnormalities of gait and mobility: Secondary | ICD-10-CM | POA: Diagnosis not present

## 2020-12-16 DIAGNOSIS — Z736 Limitation of activities due to disability: Secondary | ICD-10-CM | POA: Diagnosis not present

## 2020-12-16 DIAGNOSIS — M6281 Muscle weakness (generalized): Secondary | ICD-10-CM | POA: Diagnosis not present

## 2020-12-17 DIAGNOSIS — Z9181 History of falling: Secondary | ICD-10-CM | POA: Diagnosis not present

## 2020-12-17 DIAGNOSIS — R1312 Dysphagia, oropharyngeal phase: Secondary | ICD-10-CM | POA: Diagnosis not present

## 2020-12-17 DIAGNOSIS — R2689 Other abnormalities of gait and mobility: Secondary | ICD-10-CM | POA: Diagnosis not present

## 2020-12-17 DIAGNOSIS — Z736 Limitation of activities due to disability: Secondary | ICD-10-CM | POA: Diagnosis not present

## 2020-12-17 DIAGNOSIS — J690 Pneumonitis due to inhalation of food and vomit: Secondary | ICD-10-CM | POA: Diagnosis not present

## 2020-12-17 DIAGNOSIS — M6281 Muscle weakness (generalized): Secondary | ICD-10-CM | POA: Diagnosis not present

## 2020-12-17 DIAGNOSIS — R488 Other symbolic dysfunctions: Secondary | ICD-10-CM | POA: Diagnosis not present

## 2020-12-18 DIAGNOSIS — J690 Pneumonitis due to inhalation of food and vomit: Secondary | ICD-10-CM | POA: Diagnosis not present

## 2020-12-18 DIAGNOSIS — M6281 Muscle weakness (generalized): Secondary | ICD-10-CM | POA: Diagnosis not present

## 2020-12-18 DIAGNOSIS — R2689 Other abnormalities of gait and mobility: Secondary | ICD-10-CM | POA: Diagnosis not present

## 2020-12-18 DIAGNOSIS — Z736 Limitation of activities due to disability: Secondary | ICD-10-CM | POA: Diagnosis not present

## 2020-12-18 DIAGNOSIS — R488 Other symbolic dysfunctions: Secondary | ICD-10-CM | POA: Diagnosis not present

## 2020-12-18 DIAGNOSIS — R1312 Dysphagia, oropharyngeal phase: Secondary | ICD-10-CM | POA: Diagnosis not present

## 2020-12-18 DIAGNOSIS — Z9181 History of falling: Secondary | ICD-10-CM | POA: Diagnosis not present

## 2020-12-19 DIAGNOSIS — M6281 Muscle weakness (generalized): Secondary | ICD-10-CM | POA: Diagnosis not present

## 2020-12-19 DIAGNOSIS — Z9181 History of falling: Secondary | ICD-10-CM | POA: Diagnosis not present

## 2020-12-19 DIAGNOSIS — Z736 Limitation of activities due to disability: Secondary | ICD-10-CM | POA: Diagnosis not present

## 2020-12-19 DIAGNOSIS — R488 Other symbolic dysfunctions: Secondary | ICD-10-CM | POA: Diagnosis not present

## 2020-12-19 DIAGNOSIS — R1312 Dysphagia, oropharyngeal phase: Secondary | ICD-10-CM | POA: Diagnosis not present

## 2020-12-19 DIAGNOSIS — R2689 Other abnormalities of gait and mobility: Secondary | ICD-10-CM | POA: Diagnosis not present

## 2020-12-19 DIAGNOSIS — J690 Pneumonitis due to inhalation of food and vomit: Secondary | ICD-10-CM | POA: Diagnosis not present

## 2020-12-20 DIAGNOSIS — Z736 Limitation of activities due to disability: Secondary | ICD-10-CM | POA: Diagnosis not present

## 2020-12-20 DIAGNOSIS — R1312 Dysphagia, oropharyngeal phase: Secondary | ICD-10-CM | POA: Diagnosis not present

## 2020-12-20 DIAGNOSIS — R2689 Other abnormalities of gait and mobility: Secondary | ICD-10-CM | POA: Diagnosis not present

## 2020-12-20 DIAGNOSIS — Z9181 History of falling: Secondary | ICD-10-CM | POA: Diagnosis not present

## 2020-12-20 DIAGNOSIS — M6281 Muscle weakness (generalized): Secondary | ICD-10-CM | POA: Diagnosis not present

## 2020-12-20 DIAGNOSIS — R488 Other symbolic dysfunctions: Secondary | ICD-10-CM | POA: Diagnosis not present

## 2020-12-20 DIAGNOSIS — J690 Pneumonitis due to inhalation of food and vomit: Secondary | ICD-10-CM | POA: Diagnosis not present

## 2020-12-21 ENCOUNTER — Non-Acute Institutional Stay: Payer: Medicare Other | Admitting: Primary Care

## 2020-12-21 ENCOUNTER — Other Ambulatory Visit: Payer: Self-pay

## 2020-12-21 DIAGNOSIS — G8929 Other chronic pain: Secondary | ICD-10-CM

## 2020-12-21 DIAGNOSIS — M545 Low back pain, unspecified: Secondary | ICD-10-CM | POA: Diagnosis not present

## 2020-12-21 DIAGNOSIS — R488 Other symbolic dysfunctions: Secondary | ICD-10-CM | POA: Diagnosis not present

## 2020-12-21 DIAGNOSIS — M1990 Unspecified osteoarthritis, unspecified site: Secondary | ICD-10-CM | POA: Diagnosis not present

## 2020-12-21 DIAGNOSIS — R1312 Dysphagia, oropharyngeal phase: Secondary | ICD-10-CM | POA: Diagnosis not present

## 2020-12-21 DIAGNOSIS — Z515 Encounter for palliative care: Secondary | ICD-10-CM

## 2020-12-21 DIAGNOSIS — S72002A Fracture of unspecified part of neck of left femur, initial encounter for closed fracture: Secondary | ICD-10-CM | POA: Diagnosis not present

## 2020-12-21 DIAGNOSIS — Z9181 History of falling: Secondary | ICD-10-CM | POA: Diagnosis not present

## 2020-12-21 DIAGNOSIS — J690 Pneumonitis due to inhalation of food and vomit: Secondary | ICD-10-CM | POA: Diagnosis not present

## 2020-12-21 DIAGNOSIS — E43 Unspecified severe protein-calorie malnutrition: Secondary | ICD-10-CM | POA: Diagnosis not present

## 2020-12-21 DIAGNOSIS — M6281 Muscle weakness (generalized): Secondary | ICD-10-CM | POA: Diagnosis not present

## 2020-12-21 DIAGNOSIS — R2689 Other abnormalities of gait and mobility: Secondary | ICD-10-CM | POA: Diagnosis not present

## 2020-12-21 DIAGNOSIS — I480 Paroxysmal atrial fibrillation: Secondary | ICD-10-CM | POA: Diagnosis not present

## 2020-12-21 DIAGNOSIS — Z736 Limitation of activities due to disability: Secondary | ICD-10-CM | POA: Diagnosis not present

## 2020-12-21 DIAGNOSIS — I5032 Chronic diastolic (congestive) heart failure: Secondary | ICD-10-CM | POA: Diagnosis not present

## 2020-12-21 NOTE — Progress Notes (Signed)
[facility] Consult Note Telephone: [phone]  Fax: [phone]    Date of encounter: 12/21/20 PATIENT NAME: Carl [name] [address] [place] [address]   [phone] (home)  DOB: 1936/04/28 MRN: [id number] PRIMARY CARE PROVIDER:    Alvester Morin, MD,  [place]. [place] [address] [phone]  REFERRING PROVIDER:   Alvester Morin, MD [address]. [place],  [place] [address] [phone]  RESPONSIBLE PARTY:    Contact Information    Name Relation Home Work Mobile   Hiller,Jaelen [place]. Son [phone]  [phone]   TIM, CORRIHER Daughter   [phone]   Risko,Connie Daughter   [phone]       I met face to face with patient in [facility] facility. Palliative Care was asked to follow this patient by consultation request of  Slade-Hartman, Ivette Loyal* to address advance care planning and complex medical decision making. This is a follow up visit.                                   ASSESSMENT AND PLAN / RECOMMENDATIONS:   Advance Care Planning/Goals of Care: Goals include to maximize quality of life and symptom management. Our advance care planning conversation included a discussion about:     Exploration of personal, cultural or spiritual beliefs that might influence medical decisions   Exploration of goals of care in the event of a sudden injury or illness   Review and updating or creation of an  advance directive document .  CODE STATUS: DNR  Symptom Management/Plan:  Up in chair, states he has not pain. Appears to be eating better and more interactive and smiling  than our last visit. States he is working with PT some for strengthening but still non ambulatory.  Current and past weights: 129 lbs, stable x 2 months; 159 lbs in March. Have been monitoring for profound weight loss. This appears to have stopped but will require ongoing monitoring. Pain  appears controlled under present regimen.  States he wants his shoes, which I let his daughter [name] know. Shoes may help his ambulation. I spoke with her, she had no concerns or questions.  Follow up Palliative Care Visit: Palliative care will continue to follow for complex medical decision making, advance care planning, and clarification of goals. Return 4 weeks or prn.  I spent 25 minutes providing this consultation. More than 50% of the time in this consultation was spent in counseling and care coordination.  PPS: 30%  HOSPICE ELIGIBILITY/DIAGNOSIS: TBD  Chief Complaint: weight loss  HISTORY OF PRESENT ILLNESS:  Carl [name] is a 85 y.o. year old male  with h/o  Profound weight loss, debility, immobility, HFrEF, h/o sepsis.  History obtained from review of EMR, discussion with primary team, and interview with family, facility staff/caregiver and/or Carl [name].  I reviewed available labs, medications, imaging, studies and related documents from the EMR.  Records reviewed and summarized above.   ROS   General: NAD ENMT: denies dysphagia Cardiovascular: denies chest pain, denies DOE Pulmonary: denies cough, denies increased SOB Abdomen: endorses fair appetite MSK:  endorses weakness,  no falls reported Skin: denies rashes or wounds Neurological: denies pain, denies insomnia Psych: Endorses positive mood Heme/lymph/immuno: denies bruises, abnormal bleeding  Physical Exam: .  Constitutional: NAD General: frail appearing, thin  EYES: anicteric sclera, lids intact, no discharge  ENMT: intact hearing, oral mucous membranes moist CV:  no LE edema Pulmonary:  no increased work of breathing, no cough, room air Abdomen: intake 50%, no ascites GU: deferred MSK: severe sarcopenia, moves all extremities, non ambulatory Skin: warm and dry, no rashes or wounds on visible skin Neuro: ++ generalized weakness,  some cognitive impairment Psych: non-anxious affect, A and O x  2-3 Hem/lymph/immuno: no widespread bruising   Thank you for the opportunity to participate in the care of Carl [name].  The palliative care team will continue to follow. Please call our office at [phone] if we can be of additional assistance.   Jason Coop, NP , DNP, MPH, AGPCNP-BC, ACHPN  COVID-19 PATIENT SCREENING TOOL Asked and negative response unless otherwise noted:   Have you had symptoms of covid, tested positive or been in contact with someone with symptoms/positive test in the past 5-10 days?

## 2020-12-23 DIAGNOSIS — M6281 Muscle weakness (generalized): Secondary | ICD-10-CM | POA: Diagnosis not present

## 2020-12-23 DIAGNOSIS — Z736 Limitation of activities due to disability: Secondary | ICD-10-CM | POA: Diagnosis not present

## 2020-12-23 DIAGNOSIS — R488 Other symbolic dysfunctions: Secondary | ICD-10-CM | POA: Diagnosis not present

## 2020-12-23 DIAGNOSIS — R2689 Other abnormalities of gait and mobility: Secondary | ICD-10-CM | POA: Diagnosis not present

## 2020-12-23 DIAGNOSIS — J690 Pneumonitis due to inhalation of food and vomit: Secondary | ICD-10-CM | POA: Diagnosis not present

## 2020-12-23 DIAGNOSIS — R1312 Dysphagia, oropharyngeal phase: Secondary | ICD-10-CM | POA: Diagnosis not present

## 2020-12-23 DIAGNOSIS — Z9181 History of falling: Secondary | ICD-10-CM | POA: Diagnosis not present

## 2020-12-24 DIAGNOSIS — R488 Other symbolic dysfunctions: Secondary | ICD-10-CM | POA: Diagnosis not present

## 2020-12-24 DIAGNOSIS — Z736 Limitation of activities due to disability: Secondary | ICD-10-CM | POA: Diagnosis not present

## 2020-12-24 DIAGNOSIS — R2689 Other abnormalities of gait and mobility: Secondary | ICD-10-CM | POA: Diagnosis not present

## 2020-12-24 DIAGNOSIS — R1312 Dysphagia, oropharyngeal phase: Secondary | ICD-10-CM | POA: Diagnosis not present

## 2020-12-24 DIAGNOSIS — J690 Pneumonitis due to inhalation of food and vomit: Secondary | ICD-10-CM | POA: Diagnosis not present

## 2020-12-24 DIAGNOSIS — M6281 Muscle weakness (generalized): Secondary | ICD-10-CM | POA: Diagnosis not present

## 2020-12-24 DIAGNOSIS — Z9181 History of falling: Secondary | ICD-10-CM | POA: Diagnosis not present

## 2020-12-25 DIAGNOSIS — R2689 Other abnormalities of gait and mobility: Secondary | ICD-10-CM | POA: Diagnosis not present

## 2020-12-25 DIAGNOSIS — R1312 Dysphagia, oropharyngeal phase: Secondary | ICD-10-CM | POA: Diagnosis not present

## 2020-12-25 DIAGNOSIS — Z736 Limitation of activities due to disability: Secondary | ICD-10-CM | POA: Diagnosis not present

## 2020-12-25 DIAGNOSIS — Z9181 History of falling: Secondary | ICD-10-CM | POA: Diagnosis not present

## 2020-12-25 DIAGNOSIS — R488 Other symbolic dysfunctions: Secondary | ICD-10-CM | POA: Diagnosis not present

## 2020-12-25 DIAGNOSIS — M6281 Muscle weakness (generalized): Secondary | ICD-10-CM | POA: Diagnosis not present

## 2020-12-25 DIAGNOSIS — J690 Pneumonitis due to inhalation of food and vomit: Secondary | ICD-10-CM | POA: Diagnosis not present

## 2020-12-26 DIAGNOSIS — R488 Other symbolic dysfunctions: Secondary | ICD-10-CM | POA: Diagnosis not present

## 2020-12-26 DIAGNOSIS — Z736 Limitation of activities due to disability: Secondary | ICD-10-CM | POA: Diagnosis not present

## 2020-12-26 DIAGNOSIS — R2689 Other abnormalities of gait and mobility: Secondary | ICD-10-CM | POA: Diagnosis not present

## 2020-12-26 DIAGNOSIS — R1312 Dysphagia, oropharyngeal phase: Secondary | ICD-10-CM | POA: Diagnosis not present

## 2020-12-26 DIAGNOSIS — Z9181 History of falling: Secondary | ICD-10-CM | POA: Diagnosis not present

## 2020-12-26 DIAGNOSIS — M6281 Muscle weakness (generalized): Secondary | ICD-10-CM | POA: Diagnosis not present

## 2020-12-27 DIAGNOSIS — R1312 Dysphagia, oropharyngeal phase: Secondary | ICD-10-CM | POA: Diagnosis not present

## 2020-12-27 DIAGNOSIS — R488 Other symbolic dysfunctions: Secondary | ICD-10-CM | POA: Diagnosis not present

## 2020-12-27 DIAGNOSIS — M6281 Muscle weakness (generalized): Secondary | ICD-10-CM | POA: Diagnosis not present

## 2020-12-27 DIAGNOSIS — Z9181 History of falling: Secondary | ICD-10-CM | POA: Diagnosis not present

## 2020-12-27 DIAGNOSIS — Z736 Limitation of activities due to disability: Secondary | ICD-10-CM | POA: Diagnosis not present

## 2020-12-27 DIAGNOSIS — R2689 Other abnormalities of gait and mobility: Secondary | ICD-10-CM | POA: Diagnosis not present

## 2020-12-28 DIAGNOSIS — M6281 Muscle weakness (generalized): Secondary | ICD-10-CM | POA: Diagnosis not present

## 2020-12-28 DIAGNOSIS — R1312 Dysphagia, oropharyngeal phase: Secondary | ICD-10-CM | POA: Diagnosis not present

## 2020-12-28 DIAGNOSIS — R488 Other symbolic dysfunctions: Secondary | ICD-10-CM | POA: Diagnosis not present

## 2020-12-28 DIAGNOSIS — Z736 Limitation of activities due to disability: Secondary | ICD-10-CM | POA: Diagnosis not present

## 2020-12-28 DIAGNOSIS — R2689 Other abnormalities of gait and mobility: Secondary | ICD-10-CM | POA: Diagnosis not present

## 2020-12-28 DIAGNOSIS — Z9181 History of falling: Secondary | ICD-10-CM | POA: Diagnosis not present

## 2020-12-30 DIAGNOSIS — R488 Other symbolic dysfunctions: Secondary | ICD-10-CM | POA: Diagnosis not present

## 2020-12-30 DIAGNOSIS — Z9181 History of falling: Secondary | ICD-10-CM | POA: Diagnosis not present

## 2020-12-30 DIAGNOSIS — M6281 Muscle weakness (generalized): Secondary | ICD-10-CM | POA: Diagnosis not present

## 2020-12-30 DIAGNOSIS — R1312 Dysphagia, oropharyngeal phase: Secondary | ICD-10-CM | POA: Diagnosis not present

## 2020-12-30 DIAGNOSIS — Z736 Limitation of activities due to disability: Secondary | ICD-10-CM | POA: Diagnosis not present

## 2020-12-30 DIAGNOSIS — R2689 Other abnormalities of gait and mobility: Secondary | ICD-10-CM | POA: Diagnosis not present

## 2020-12-31 DIAGNOSIS — M6281 Muscle weakness (generalized): Secondary | ICD-10-CM | POA: Diagnosis not present

## 2020-12-31 DIAGNOSIS — R2689 Other abnormalities of gait and mobility: Secondary | ICD-10-CM | POA: Diagnosis not present

## 2020-12-31 DIAGNOSIS — R1312 Dysphagia, oropharyngeal phase: Secondary | ICD-10-CM | POA: Diagnosis not present

## 2020-12-31 DIAGNOSIS — R488 Other symbolic dysfunctions: Secondary | ICD-10-CM | POA: Diagnosis not present

## 2020-12-31 DIAGNOSIS — Z9181 History of falling: Secondary | ICD-10-CM | POA: Diagnosis not present

## 2020-12-31 DIAGNOSIS — Z736 Limitation of activities due to disability: Secondary | ICD-10-CM | POA: Diagnosis not present

## 2021-01-01 DIAGNOSIS — R2689 Other abnormalities of gait and mobility: Secondary | ICD-10-CM | POA: Diagnosis not present

## 2021-01-01 DIAGNOSIS — Z9181 History of falling: Secondary | ICD-10-CM | POA: Diagnosis not present

## 2021-01-01 DIAGNOSIS — R1312 Dysphagia, oropharyngeal phase: Secondary | ICD-10-CM | POA: Diagnosis not present

## 2021-01-01 DIAGNOSIS — M6281 Muscle weakness (generalized): Secondary | ICD-10-CM | POA: Diagnosis not present

## 2021-01-01 DIAGNOSIS — R488 Other symbolic dysfunctions: Secondary | ICD-10-CM | POA: Diagnosis not present

## 2021-01-01 DIAGNOSIS — Z736 Limitation of activities due to disability: Secondary | ICD-10-CM | POA: Diagnosis not present

## 2021-01-02 DIAGNOSIS — M6281 Muscle weakness (generalized): Secondary | ICD-10-CM | POA: Diagnosis not present

## 2021-01-02 DIAGNOSIS — Z736 Limitation of activities due to disability: Secondary | ICD-10-CM | POA: Diagnosis not present

## 2021-01-02 DIAGNOSIS — R2689 Other abnormalities of gait and mobility: Secondary | ICD-10-CM | POA: Diagnosis not present

## 2021-01-02 DIAGNOSIS — R1312 Dysphagia, oropharyngeal phase: Secondary | ICD-10-CM | POA: Diagnosis not present

## 2021-01-02 DIAGNOSIS — Z9181 History of falling: Secondary | ICD-10-CM | POA: Diagnosis not present

## 2021-01-02 DIAGNOSIS — R488 Other symbolic dysfunctions: Secondary | ICD-10-CM | POA: Diagnosis not present

## 2021-01-03 DIAGNOSIS — M6281 Muscle weakness (generalized): Secondary | ICD-10-CM | POA: Diagnosis not present

## 2021-01-03 DIAGNOSIS — R488 Other symbolic dysfunctions: Secondary | ICD-10-CM | POA: Diagnosis not present

## 2021-01-03 DIAGNOSIS — R1312 Dysphagia, oropharyngeal phase: Secondary | ICD-10-CM | POA: Diagnosis not present

## 2021-01-03 DIAGNOSIS — Z736 Limitation of activities due to disability: Secondary | ICD-10-CM | POA: Diagnosis not present

## 2021-01-03 DIAGNOSIS — Z9181 History of falling: Secondary | ICD-10-CM | POA: Diagnosis not present

## 2021-01-03 DIAGNOSIS — R2689 Other abnormalities of gait and mobility: Secondary | ICD-10-CM | POA: Diagnosis not present

## 2021-01-04 DIAGNOSIS — R488 Other symbolic dysfunctions: Secondary | ICD-10-CM | POA: Diagnosis not present

## 2021-01-04 DIAGNOSIS — Z9181 History of falling: Secondary | ICD-10-CM | POA: Diagnosis not present

## 2021-01-04 DIAGNOSIS — R1312 Dysphagia, oropharyngeal phase: Secondary | ICD-10-CM | POA: Diagnosis not present

## 2021-01-04 DIAGNOSIS — Z736 Limitation of activities due to disability: Secondary | ICD-10-CM | POA: Diagnosis not present

## 2021-01-04 DIAGNOSIS — R2689 Other abnormalities of gait and mobility: Secondary | ICD-10-CM | POA: Diagnosis not present

## 2021-01-04 DIAGNOSIS — M6281 Muscle weakness (generalized): Secondary | ICD-10-CM | POA: Diagnosis not present

## 2021-01-05 DIAGNOSIS — R1312 Dysphagia, oropharyngeal phase: Secondary | ICD-10-CM | POA: Diagnosis not present

## 2021-01-05 DIAGNOSIS — R2689 Other abnormalities of gait and mobility: Secondary | ICD-10-CM | POA: Diagnosis not present

## 2021-01-05 DIAGNOSIS — Z736 Limitation of activities due to disability: Secondary | ICD-10-CM | POA: Diagnosis not present

## 2021-01-05 DIAGNOSIS — R488 Other symbolic dysfunctions: Secondary | ICD-10-CM | POA: Diagnosis not present

## 2021-01-05 DIAGNOSIS — Z9181 History of falling: Secondary | ICD-10-CM | POA: Diagnosis not present

## 2021-01-05 DIAGNOSIS — M6281 Muscle weakness (generalized): Secondary | ICD-10-CM | POA: Diagnosis not present

## 2021-01-06 DIAGNOSIS — M6281 Muscle weakness (generalized): Secondary | ICD-10-CM | POA: Diagnosis not present

## 2021-01-06 DIAGNOSIS — R1312 Dysphagia, oropharyngeal phase: Secondary | ICD-10-CM | POA: Diagnosis not present

## 2021-01-06 DIAGNOSIS — Z736 Limitation of activities due to disability: Secondary | ICD-10-CM | POA: Diagnosis not present

## 2021-01-06 DIAGNOSIS — Z9181 History of falling: Secondary | ICD-10-CM | POA: Diagnosis not present

## 2021-01-06 DIAGNOSIS — R488 Other symbolic dysfunctions: Secondary | ICD-10-CM | POA: Diagnosis not present

## 2021-01-06 DIAGNOSIS — R2689 Other abnormalities of gait and mobility: Secondary | ICD-10-CM | POA: Diagnosis not present

## 2021-01-07 DIAGNOSIS — Z9181 History of falling: Secondary | ICD-10-CM | POA: Diagnosis not present

## 2021-01-07 DIAGNOSIS — M6281 Muscle weakness (generalized): Secondary | ICD-10-CM | POA: Diagnosis not present

## 2021-01-07 DIAGNOSIS — R488 Other symbolic dysfunctions: Secondary | ICD-10-CM | POA: Diagnosis not present

## 2021-01-07 DIAGNOSIS — Z736 Limitation of activities due to disability: Secondary | ICD-10-CM | POA: Diagnosis not present

## 2021-01-07 DIAGNOSIS — R1312 Dysphagia, oropharyngeal phase: Secondary | ICD-10-CM | POA: Diagnosis not present

## 2021-01-07 DIAGNOSIS — R2689 Other abnormalities of gait and mobility: Secondary | ICD-10-CM | POA: Diagnosis not present

## 2021-01-08 DIAGNOSIS — R488 Other symbolic dysfunctions: Secondary | ICD-10-CM | POA: Diagnosis not present

## 2021-01-08 DIAGNOSIS — Z9181 History of falling: Secondary | ICD-10-CM | POA: Diagnosis not present

## 2021-01-08 DIAGNOSIS — R2689 Other abnormalities of gait and mobility: Secondary | ICD-10-CM | POA: Diagnosis not present

## 2021-01-08 DIAGNOSIS — M6281 Muscle weakness (generalized): Secondary | ICD-10-CM | POA: Diagnosis not present

## 2021-01-08 DIAGNOSIS — R1312 Dysphagia, oropharyngeal phase: Secondary | ICD-10-CM | POA: Diagnosis not present

## 2021-01-08 DIAGNOSIS — Z736 Limitation of activities due to disability: Secondary | ICD-10-CM | POA: Diagnosis not present

## 2021-01-09 DIAGNOSIS — Z736 Limitation of activities due to disability: Secondary | ICD-10-CM | POA: Diagnosis not present

## 2021-01-09 DIAGNOSIS — M6281 Muscle weakness (generalized): Secondary | ICD-10-CM | POA: Diagnosis not present

## 2021-01-09 DIAGNOSIS — Z9181 History of falling: Secondary | ICD-10-CM | POA: Diagnosis not present

## 2021-01-09 DIAGNOSIS — R1312 Dysphagia, oropharyngeal phase: Secondary | ICD-10-CM | POA: Diagnosis not present

## 2021-01-09 DIAGNOSIS — R2689 Other abnormalities of gait and mobility: Secondary | ICD-10-CM | POA: Diagnosis not present

## 2021-01-09 DIAGNOSIS — R488 Other symbolic dysfunctions: Secondary | ICD-10-CM | POA: Diagnosis not present

## 2021-01-10 DIAGNOSIS — Z736 Limitation of activities due to disability: Secondary | ICD-10-CM | POA: Diagnosis not present

## 2021-01-10 DIAGNOSIS — Z9181 History of falling: Secondary | ICD-10-CM | POA: Diagnosis not present

## 2021-01-10 DIAGNOSIS — M6281 Muscle weakness (generalized): Secondary | ICD-10-CM | POA: Diagnosis not present

## 2021-01-10 DIAGNOSIS — R1312 Dysphagia, oropharyngeal phase: Secondary | ICD-10-CM | POA: Diagnosis not present

## 2021-01-10 DIAGNOSIS — R488 Other symbolic dysfunctions: Secondary | ICD-10-CM | POA: Diagnosis not present

## 2021-01-10 DIAGNOSIS — R2689 Other abnormalities of gait and mobility: Secondary | ICD-10-CM | POA: Diagnosis not present

## 2021-01-11 DIAGNOSIS — Z736 Limitation of activities due to disability: Secondary | ICD-10-CM | POA: Diagnosis not present

## 2021-01-11 DIAGNOSIS — R1312 Dysphagia, oropharyngeal phase: Secondary | ICD-10-CM | POA: Diagnosis not present

## 2021-01-11 DIAGNOSIS — Z9181 History of falling: Secondary | ICD-10-CM | POA: Diagnosis not present

## 2021-01-11 DIAGNOSIS — M6281 Muscle weakness (generalized): Secondary | ICD-10-CM | POA: Diagnosis not present

## 2021-01-11 DIAGNOSIS — R2689 Other abnormalities of gait and mobility: Secondary | ICD-10-CM | POA: Diagnosis not present

## 2021-01-11 DIAGNOSIS — R488 Other symbolic dysfunctions: Secondary | ICD-10-CM | POA: Diagnosis not present

## 2021-01-14 DIAGNOSIS — Z736 Limitation of activities due to disability: Secondary | ICD-10-CM | POA: Diagnosis not present

## 2021-01-14 DIAGNOSIS — M6281 Muscle weakness (generalized): Secondary | ICD-10-CM | POA: Diagnosis not present

## 2021-01-14 DIAGNOSIS — Z9181 History of falling: Secondary | ICD-10-CM | POA: Diagnosis not present

## 2021-01-14 DIAGNOSIS — R488 Other symbolic dysfunctions: Secondary | ICD-10-CM | POA: Diagnosis not present

## 2021-01-14 DIAGNOSIS — R2689 Other abnormalities of gait and mobility: Secondary | ICD-10-CM | POA: Diagnosis not present

## 2021-01-14 DIAGNOSIS — R1312 Dysphagia, oropharyngeal phase: Secondary | ICD-10-CM | POA: Diagnosis not present

## 2021-01-15 DIAGNOSIS — R488 Other symbolic dysfunctions: Secondary | ICD-10-CM | POA: Diagnosis not present

## 2021-01-15 DIAGNOSIS — Z9181 History of falling: Secondary | ICD-10-CM | POA: Diagnosis not present

## 2021-01-15 DIAGNOSIS — R1312 Dysphagia, oropharyngeal phase: Secondary | ICD-10-CM | POA: Diagnosis not present

## 2021-01-15 DIAGNOSIS — Z736 Limitation of activities due to disability: Secondary | ICD-10-CM | POA: Diagnosis not present

## 2021-01-15 DIAGNOSIS — R2689 Other abnormalities of gait and mobility: Secondary | ICD-10-CM | POA: Diagnosis not present

## 2021-01-15 DIAGNOSIS — M6281 Muscle weakness (generalized): Secondary | ICD-10-CM | POA: Diagnosis not present

## 2021-01-16 DIAGNOSIS — Z9181 History of falling: Secondary | ICD-10-CM | POA: Diagnosis not present

## 2021-01-16 DIAGNOSIS — Z736 Limitation of activities due to disability: Secondary | ICD-10-CM | POA: Diagnosis not present

## 2021-01-16 DIAGNOSIS — R2689 Other abnormalities of gait and mobility: Secondary | ICD-10-CM | POA: Diagnosis not present

## 2021-01-16 DIAGNOSIS — R1312 Dysphagia, oropharyngeal phase: Secondary | ICD-10-CM | POA: Diagnosis not present

## 2021-01-16 DIAGNOSIS — R488 Other symbolic dysfunctions: Secondary | ICD-10-CM | POA: Diagnosis not present

## 2021-01-16 DIAGNOSIS — M6281 Muscle weakness (generalized): Secondary | ICD-10-CM | POA: Diagnosis not present

## 2021-01-17 DIAGNOSIS — R2689 Other abnormalities of gait and mobility: Secondary | ICD-10-CM | POA: Diagnosis not present

## 2021-01-17 DIAGNOSIS — Z9181 History of falling: Secondary | ICD-10-CM | POA: Diagnosis not present

## 2021-01-17 DIAGNOSIS — R488 Other symbolic dysfunctions: Secondary | ICD-10-CM | POA: Diagnosis not present

## 2021-01-17 DIAGNOSIS — Z736 Limitation of activities due to disability: Secondary | ICD-10-CM | POA: Diagnosis not present

## 2021-01-17 DIAGNOSIS — M6281 Muscle weakness (generalized): Secondary | ICD-10-CM | POA: Diagnosis not present

## 2021-01-17 DIAGNOSIS — R1312 Dysphagia, oropharyngeal phase: Secondary | ICD-10-CM | POA: Diagnosis not present

## 2021-01-18 DIAGNOSIS — R2689 Other abnormalities of gait and mobility: Secondary | ICD-10-CM | POA: Diagnosis not present

## 2021-01-18 DIAGNOSIS — M6281 Muscle weakness (generalized): Secondary | ICD-10-CM | POA: Diagnosis not present

## 2021-01-18 DIAGNOSIS — Z736 Limitation of activities due to disability: Secondary | ICD-10-CM | POA: Diagnosis not present

## 2021-01-18 DIAGNOSIS — Z9181 History of falling: Secondary | ICD-10-CM | POA: Diagnosis not present

## 2021-01-18 DIAGNOSIS — R488 Other symbolic dysfunctions: Secondary | ICD-10-CM | POA: Diagnosis not present

## 2021-01-18 DIAGNOSIS — R1312 Dysphagia, oropharyngeal phase: Secondary | ICD-10-CM | POA: Diagnosis not present

## 2021-01-19 DIAGNOSIS — M6281 Muscle weakness (generalized): Secondary | ICD-10-CM | POA: Diagnosis not present

## 2021-01-19 DIAGNOSIS — R2689 Other abnormalities of gait and mobility: Secondary | ICD-10-CM | POA: Diagnosis not present

## 2021-01-19 DIAGNOSIS — Z736 Limitation of activities due to disability: Secondary | ICD-10-CM | POA: Diagnosis not present

## 2021-01-19 DIAGNOSIS — Z9181 History of falling: Secondary | ICD-10-CM | POA: Diagnosis not present

## 2021-01-19 DIAGNOSIS — R1312 Dysphagia, oropharyngeal phase: Secondary | ICD-10-CM | POA: Diagnosis not present

## 2021-01-19 DIAGNOSIS — R488 Other symbolic dysfunctions: Secondary | ICD-10-CM | POA: Diagnosis not present

## 2021-01-20 DIAGNOSIS — R488 Other symbolic dysfunctions: Secondary | ICD-10-CM | POA: Diagnosis not present

## 2021-01-20 DIAGNOSIS — M6281 Muscle weakness (generalized): Secondary | ICD-10-CM | POA: Diagnosis not present

## 2021-01-20 DIAGNOSIS — R2689 Other abnormalities of gait and mobility: Secondary | ICD-10-CM | POA: Diagnosis not present

## 2021-01-20 DIAGNOSIS — Z9181 History of falling: Secondary | ICD-10-CM | POA: Diagnosis not present

## 2021-01-20 DIAGNOSIS — Z736 Limitation of activities due to disability: Secondary | ICD-10-CM | POA: Diagnosis not present

## 2021-01-20 DIAGNOSIS — R1312 Dysphagia, oropharyngeal phase: Secondary | ICD-10-CM | POA: Diagnosis not present

## 2021-01-21 DIAGNOSIS — Z736 Limitation of activities due to disability: Secondary | ICD-10-CM | POA: Diagnosis not present

## 2021-01-21 DIAGNOSIS — Z9181 History of falling: Secondary | ICD-10-CM | POA: Diagnosis not present

## 2021-01-21 DIAGNOSIS — R2689 Other abnormalities of gait and mobility: Secondary | ICD-10-CM | POA: Diagnosis not present

## 2021-01-21 DIAGNOSIS — M6281 Muscle weakness (generalized): Secondary | ICD-10-CM | POA: Diagnosis not present

## 2021-01-21 DIAGNOSIS — R1312 Dysphagia, oropharyngeal phase: Secondary | ICD-10-CM | POA: Diagnosis not present

## 2021-01-21 DIAGNOSIS — R488 Other symbolic dysfunctions: Secondary | ICD-10-CM | POA: Diagnosis not present

## 2021-01-22 DIAGNOSIS — R488 Other symbolic dysfunctions: Secondary | ICD-10-CM | POA: Diagnosis not present

## 2021-01-22 DIAGNOSIS — R1312 Dysphagia, oropharyngeal phase: Secondary | ICD-10-CM | POA: Diagnosis not present

## 2021-01-22 DIAGNOSIS — Z9181 History of falling: Secondary | ICD-10-CM | POA: Diagnosis not present

## 2021-01-22 DIAGNOSIS — Z736 Limitation of activities due to disability: Secondary | ICD-10-CM | POA: Diagnosis not present

## 2021-01-22 DIAGNOSIS — R2689 Other abnormalities of gait and mobility: Secondary | ICD-10-CM | POA: Diagnosis not present

## 2021-01-22 DIAGNOSIS — M6281 Muscle weakness (generalized): Secondary | ICD-10-CM | POA: Diagnosis not present

## 2021-01-23 DIAGNOSIS — M6281 Muscle weakness (generalized): Secondary | ICD-10-CM | POA: Diagnosis not present

## 2021-01-23 DIAGNOSIS — Z736 Limitation of activities due to disability: Secondary | ICD-10-CM | POA: Diagnosis not present

## 2021-01-23 DIAGNOSIS — R2689 Other abnormalities of gait and mobility: Secondary | ICD-10-CM | POA: Diagnosis not present

## 2021-01-23 DIAGNOSIS — Z9181 History of falling: Secondary | ICD-10-CM | POA: Diagnosis not present

## 2021-01-23 DIAGNOSIS — R1312 Dysphagia, oropharyngeal phase: Secondary | ICD-10-CM | POA: Diagnosis not present

## 2021-01-23 DIAGNOSIS — R488 Other symbolic dysfunctions: Secondary | ICD-10-CM | POA: Diagnosis not present

## 2021-01-24 DIAGNOSIS — Z736 Limitation of activities due to disability: Secondary | ICD-10-CM | POA: Diagnosis not present

## 2021-01-24 DIAGNOSIS — Z9181 History of falling: Secondary | ICD-10-CM | POA: Diagnosis not present

## 2021-01-24 DIAGNOSIS — R1312 Dysphagia, oropharyngeal phase: Secondary | ICD-10-CM | POA: Diagnosis not present

## 2021-01-24 DIAGNOSIS — R2689 Other abnormalities of gait and mobility: Secondary | ICD-10-CM | POA: Diagnosis not present

## 2021-01-24 DIAGNOSIS — R488 Other symbolic dysfunctions: Secondary | ICD-10-CM | POA: Diagnosis not present

## 2021-01-24 DIAGNOSIS — M6281 Muscle weakness (generalized): Secondary | ICD-10-CM | POA: Diagnosis not present

## 2021-02-02 DIAGNOSIS — D509 Iron deficiency anemia, unspecified: Secondary | ICD-10-CM | POA: Diagnosis not present

## 2021-02-06 DIAGNOSIS — K5900 Constipation, unspecified: Secondary | ICD-10-CM | POA: Diagnosis not present

## 2021-02-08 DIAGNOSIS — K5900 Constipation, unspecified: Secondary | ICD-10-CM | POA: Diagnosis not present

## 2021-02-19 DIAGNOSIS — L89152 Pressure ulcer of sacral region, stage 2: Secondary | ICD-10-CM | POA: Diagnosis not present

## 2021-02-19 DIAGNOSIS — D649 Anemia, unspecified: Secondary | ICD-10-CM | POA: Diagnosis not present

## 2021-02-19 DIAGNOSIS — I255 Ischemic cardiomyopathy: Secondary | ICD-10-CM | POA: Diagnosis not present

## 2021-02-19 DIAGNOSIS — R1310 Dysphagia, unspecified: Secondary | ICD-10-CM | POA: Diagnosis not present

## 2021-02-19 DIAGNOSIS — I272 Pulmonary hypertension, unspecified: Secondary | ICD-10-CM | POA: Diagnosis not present

## 2021-02-19 DIAGNOSIS — N183 Chronic kidney disease, stage 3 unspecified: Secondary | ICD-10-CM | POA: Diagnosis not present

## 2021-02-19 DIAGNOSIS — I2510 Atherosclerotic heart disease of native coronary artery without angina pectoris: Secondary | ICD-10-CM | POA: Diagnosis not present

## 2021-02-19 DIAGNOSIS — I5032 Chronic diastolic (congestive) heart failure: Secondary | ICD-10-CM | POA: Diagnosis not present

## 2021-02-19 DIAGNOSIS — I480 Paroxysmal atrial fibrillation: Secondary | ICD-10-CM | POA: Diagnosis not present

## 2021-02-19 DIAGNOSIS — K219 Gastro-esophageal reflux disease without esophagitis: Secondary | ICD-10-CM | POA: Diagnosis not present

## 2021-02-19 DIAGNOSIS — S72002A Fracture of unspecified part of neck of left femur, initial encounter for closed fracture: Secondary | ICD-10-CM | POA: Diagnosis not present

## 2021-03-06 ENCOUNTER — Non-Acute Institutional Stay: Payer: Medicare Other | Admitting: Primary Care

## 2021-03-06 ENCOUNTER — Other Ambulatory Visit: Payer: Self-pay

## 2021-03-06 ENCOUNTER — Encounter: Payer: Self-pay | Admitting: Primary Care

## 2021-03-06 VITALS — Wt 127.0 lb

## 2021-03-06 DIAGNOSIS — G8929 Other chronic pain: Secondary | ICD-10-CM

## 2021-03-06 DIAGNOSIS — M545 Low back pain, unspecified: Secondary | ICD-10-CM

## 2021-03-06 DIAGNOSIS — E43 Unspecified severe protein-calorie malnutrition: Secondary | ICD-10-CM | POA: Diagnosis not present

## 2021-03-06 DIAGNOSIS — Z515 Encounter for palliative care: Secondary | ICD-10-CM

## 2021-03-06 DIAGNOSIS — R634 Abnormal weight loss: Secondary | ICD-10-CM | POA: Diagnosis not present

## 2021-03-06 NOTE — Progress Notes (Signed)
[facility] Consult Note Telephone: [phone]  Fax: [phone]    Date of encounter: 03/06/21 PATIENT NAME: Carl [name] [address] [place] [address]   [phone] (home)  DOB: [birth year] MRN: [id number] PRIMARY CARE PROVIDER:    Alvester Morin, MD,  [address]. [place] [address] [phone]  REFERRING PROVIDER:   Alvester Morin, MD [address]. [place],  [place] [address] [phone]  RESPONSIBLE PARTY:    Contact Information     Name Relation Home Work Mobile   Bressi,Revin [place]. Son [phone]  [phone]   DARON, STUTZ Daughter   [phone]   Pinkard,Connie Daughter   [phone]       I met face to face with patient in [facility] facility. Palliative Care was asked to follow this patient by consultation request of  Carl [name]* to address advance care planning and complex medical decision making. This is a follow up visit.                                   ASSESSMENT AND PLAN / RECOMMENDATIONS:   Advance Care Planning/Goals of Care: Goals include to maximize quality of life and symptom management.  CODE STATUS: DNR  Symptom Management/Plan:   Patient alert today, endorses some fatigue. Spends most days in the bed. Endorses eating well and is on thickened liquids. None at bedside, I requested  staff to keep more thickened water at bedside.  Pt has friable skins and some skin tears on the R arm. He wears geri sleeves most of the time but still sustains some tears.   He endorses pain in his sacrum, and endorses sore/soreness there. He has on heel protectors today. He states he is mostly comfortable, and does not have any requests. His weight loss is now at almost 20% in 6 months, and qualifies him for hospice program admission should he and family desire supportive and comfort oriented care goals.  Follow up Palliative Care Visit:  Palliative care will continue to follow for complex medical decision making, advance care planning, and clarification of goals. Return 6 weeks or prn.  I spent 25 minutes providing this consultation. More than 50% of the time in this consultation was spent in counseling and care coordination.  PPS: 30%  HOSPICE ELIGIBILITY/DIAGNOSIS:  yes with concordant goals of care/weight loss. They may want to consider  to stop darolutamide, which has not been effective in preventing pt decline and debility.  Chief Complaint: debility,abnormal weight loss  HISTORY OF PRESENT ILLNESS:  Carl [name] is a 85 y.o. year old male  with debility, weight loss, CKD, CAD, deconditioning.  History obtained from review of EMR, discussion with primary team, and interview with family, facility staff/caregiver and/or Carl [name].  I reviewed available labs, medications, imaging, studies and related documents from the EMR.  Records reviewed and summarized above.   ROS/staff  General: NAD EYES: denies vision changes ENMT: endorses some dysphagia, on thickened fluids Cardiovascular: denies chest pain, denies DOE Pulmonary: denies cough, denies increased SOB Abdomen: endorses good appetite, denies constipation, endorses incontinence of bowel GU: denies dysuria, endorses incontinence of urine MSK:  endorses  weakness,  no falls reported Skin: endorses sore sacrum Neurological: denies pain, denies insomnia Psych: Endorses positive mood Heme/lymph/immuno: denies bruises, abnormal bleeding  Physical Exam: Current and past weights: 127 lbs, continues steady decline in weights. 158 lbs, 30 lb weight loss. This  is 19%. Constitutional: 90/60 HR 93 RR 18 General: frail appearing, thin EYES: anicteric sclera, lids intact, no discharge  ENMT: intact hearing, oral mucous membranes dry, edentulous CV: S1S2, RRR, no LE edema Pulmonary: LCTA, no increased work of breathing, no cough, room air Abdomen: intake 100%,  normo-active BS + 4 quadrants, soft and non tender, no ascites GU: deferred MSK: severe  sarcopenia, moves all extremities, non ambulatory Skin: warm and dry, skin tears on arm R Neuro:  severe generalized weakness,  + cognitive impairment Psych: non-anxious affect, A and O x 2 Hem/lymph/immuno: no widespread bruising  Outpatient Encounter Medications as of 03/06/2021  Medication Sig   acetaminophen (TYLENOL) 500 MG tablet Take 500 mg by mouth every 8 (eight) hours as needed for moderate pain.   acetaminophen (TYLENOL) 650 MG CR tablet Take 650 mg by mouth every 8 (eight) hours.   apixaban (ELIQUIS) 5 MG TABS tablet Take 1 tablet (5 mg total) by mouth 2 (two) times daily.   darolutamide (NUBEQA) 300 MG tablet Take 2 tablets (600 mg total) by mouth 2 (two) times daily.   diclofenac Sodium (VOLTAREN) 1 % GEL Apply 1 application topically 3 (three) times daily as needed. For pain   ferrous sulfate 325 (65 FE) MG tablet Take 1 tablet (325 mg total) by mouth daily with breakfast.   mirtazapine (REMERON SOL-TAB) 15 MG disintegrating tablet Take 15 mg by mouth at bedtime.   Multiple Vitamin (MULTIVITAMIN WITH MINERALS) TABS tablet Take 1 tablet by mouth daily.   omeprazole (PRILOSEC) 40 MG capsule TAKE 1 CAPSULE BY MOUTH EVERY DAY   senna (SENOKOT) 8.6 MG TABS tablet Take 1 tablet by mouth in the morning and at bedtime.   tamsulosin (FLOMAX) 0.4 MG CAPS capsule Take 1 capsule (0.4 mg total) by mouth daily after supper.   traMADol (ULTRAM) 50 MG tablet Take 25 mg by mouth every 6 (six) hours.   [DISCONTINUED] docusate (COLACE) 50 MG/5ML liquid Take 100 mg by mouth 2 (two) times daily.   No facility-administered encounter medications on file as of 03/06/2021.     Thank you for the opportunity to participate in the care of Carl [name].  The palliative care team will continue to follow. Please call our office at [phone] if we can be of additional assistance.   Jason Coop, NP    COVID-19 PATIENT SCREENING TOOL Asked and negative response unless otherwise noted:   Have you had symptoms of covid, tested positive or been in contact with someone with symptoms/positive test in the past 5-10 days?

## 2021-03-26 DIAGNOSIS — I2510 Atherosclerotic heart disease of native coronary artery without angina pectoris: Secondary | ICD-10-CM | POA: Diagnosis not present

## 2021-03-26 DIAGNOSIS — L603 Nail dystrophy: Secondary | ICD-10-CM | POA: Diagnosis not present

## 2021-04-09 DIAGNOSIS — R279 Unspecified lack of coordination: Secondary | ICD-10-CM | POA: Diagnosis not present

## 2021-04-09 DIAGNOSIS — M6259 Muscle wasting and atrophy, not elsewhere classified, multiple sites: Secondary | ICD-10-CM | POA: Diagnosis not present

## 2021-04-09 DIAGNOSIS — R1312 Dysphagia, oropharyngeal phase: Secondary | ICD-10-CM | POA: Diagnosis not present

## 2021-04-09 DIAGNOSIS — E43 Unspecified severe protein-calorie malnutrition: Secondary | ICD-10-CM | POA: Diagnosis not present

## 2021-04-09 DIAGNOSIS — M6281 Muscle weakness (generalized): Secondary | ICD-10-CM | POA: Diagnosis not present

## 2021-04-10 DIAGNOSIS — M6281 Muscle weakness (generalized): Secondary | ICD-10-CM | POA: Diagnosis not present

## 2021-04-10 DIAGNOSIS — R279 Unspecified lack of coordination: Secondary | ICD-10-CM | POA: Diagnosis not present

## 2021-04-10 DIAGNOSIS — E43 Unspecified severe protein-calorie malnutrition: Secondary | ICD-10-CM | POA: Diagnosis not present

## 2021-04-10 DIAGNOSIS — M6259 Muscle wasting and atrophy, not elsewhere classified, multiple sites: Secondary | ICD-10-CM | POA: Diagnosis not present

## 2021-04-10 DIAGNOSIS — R1312 Dysphagia, oropharyngeal phase: Secondary | ICD-10-CM | POA: Diagnosis not present

## 2021-04-11 DIAGNOSIS — M6259 Muscle wasting and atrophy, not elsewhere classified, multiple sites: Secondary | ICD-10-CM | POA: Diagnosis not present

## 2021-04-11 DIAGNOSIS — R279 Unspecified lack of coordination: Secondary | ICD-10-CM | POA: Diagnosis not present

## 2021-04-11 DIAGNOSIS — E43 Unspecified severe protein-calorie malnutrition: Secondary | ICD-10-CM | POA: Diagnosis not present

## 2021-04-11 DIAGNOSIS — R1312 Dysphagia, oropharyngeal phase: Secondary | ICD-10-CM | POA: Diagnosis not present

## 2021-04-11 DIAGNOSIS — M6281 Muscle weakness (generalized): Secondary | ICD-10-CM | POA: Diagnosis not present

## 2021-04-12 DIAGNOSIS — R1312 Dysphagia, oropharyngeal phase: Secondary | ICD-10-CM | POA: Diagnosis not present

## 2021-04-12 DIAGNOSIS — M6281 Muscle weakness (generalized): Secondary | ICD-10-CM | POA: Diagnosis not present

## 2021-04-12 DIAGNOSIS — R279 Unspecified lack of coordination: Secondary | ICD-10-CM | POA: Diagnosis not present

## 2021-04-12 DIAGNOSIS — E43 Unspecified severe protein-calorie malnutrition: Secondary | ICD-10-CM | POA: Diagnosis not present

## 2021-04-12 DIAGNOSIS — M6259 Muscle wasting and atrophy, not elsewhere classified, multiple sites: Secondary | ICD-10-CM | POA: Diagnosis not present

## 2021-04-15 DIAGNOSIS — R1312 Dysphagia, oropharyngeal phase: Secondary | ICD-10-CM | POA: Diagnosis not present

## 2021-04-15 DIAGNOSIS — E43 Unspecified severe protein-calorie malnutrition: Secondary | ICD-10-CM | POA: Diagnosis not present

## 2021-04-15 DIAGNOSIS — R279 Unspecified lack of coordination: Secondary | ICD-10-CM | POA: Diagnosis not present

## 2021-04-15 DIAGNOSIS — M6259 Muscle wasting and atrophy, not elsewhere classified, multiple sites: Secondary | ICD-10-CM | POA: Diagnosis not present

## 2021-04-15 DIAGNOSIS — M6281 Muscle weakness (generalized): Secondary | ICD-10-CM | POA: Diagnosis not present

## 2021-04-16 DIAGNOSIS — M6281 Muscle weakness (generalized): Secondary | ICD-10-CM | POA: Diagnosis not present

## 2021-04-16 DIAGNOSIS — R279 Unspecified lack of coordination: Secondary | ICD-10-CM | POA: Diagnosis not present

## 2021-04-16 DIAGNOSIS — R1312 Dysphagia, oropharyngeal phase: Secondary | ICD-10-CM | POA: Diagnosis not present

## 2021-04-16 DIAGNOSIS — M6259 Muscle wasting and atrophy, not elsewhere classified, multiple sites: Secondary | ICD-10-CM | POA: Diagnosis not present

## 2021-04-16 DIAGNOSIS — E43 Unspecified severe protein-calorie malnutrition: Secondary | ICD-10-CM | POA: Diagnosis not present

## 2021-04-17 DIAGNOSIS — M6281 Muscle weakness (generalized): Secondary | ICD-10-CM | POA: Diagnosis not present

## 2021-04-17 DIAGNOSIS — R1312 Dysphagia, oropharyngeal phase: Secondary | ICD-10-CM | POA: Diagnosis not present

## 2021-04-17 DIAGNOSIS — M6259 Muscle wasting and atrophy, not elsewhere classified, multiple sites: Secondary | ICD-10-CM | POA: Diagnosis not present

## 2021-04-17 DIAGNOSIS — E43 Unspecified severe protein-calorie malnutrition: Secondary | ICD-10-CM | POA: Diagnosis not present

## 2021-04-17 DIAGNOSIS — R279 Unspecified lack of coordination: Secondary | ICD-10-CM | POA: Diagnosis not present

## 2021-04-18 DIAGNOSIS — R1312 Dysphagia, oropharyngeal phase: Secondary | ICD-10-CM | POA: Diagnosis not present

## 2021-04-18 DIAGNOSIS — R279 Unspecified lack of coordination: Secondary | ICD-10-CM | POA: Diagnosis not present

## 2021-04-18 DIAGNOSIS — M6281 Muscle weakness (generalized): Secondary | ICD-10-CM | POA: Diagnosis not present

## 2021-04-18 DIAGNOSIS — M6259 Muscle wasting and atrophy, not elsewhere classified, multiple sites: Secondary | ICD-10-CM | POA: Diagnosis not present

## 2021-04-18 DIAGNOSIS — E43 Unspecified severe protein-calorie malnutrition: Secondary | ICD-10-CM | POA: Diagnosis not present

## 2021-04-19 DIAGNOSIS — R279 Unspecified lack of coordination: Secondary | ICD-10-CM | POA: Diagnosis not present

## 2021-04-19 DIAGNOSIS — M6259 Muscle wasting and atrophy, not elsewhere classified, multiple sites: Secondary | ICD-10-CM | POA: Diagnosis not present

## 2021-04-19 DIAGNOSIS — R1312 Dysphagia, oropharyngeal phase: Secondary | ICD-10-CM | POA: Diagnosis not present

## 2021-04-19 DIAGNOSIS — E43 Unspecified severe protein-calorie malnutrition: Secondary | ICD-10-CM | POA: Diagnosis not present

## 2021-04-19 DIAGNOSIS — M6281 Muscle weakness (generalized): Secondary | ICD-10-CM | POA: Diagnosis not present

## 2021-04-22 ENCOUNTER — Telehealth: Payer: Self-pay | Admitting: Family Medicine

## 2021-04-22 DIAGNOSIS — R1312 Dysphagia, oropharyngeal phase: Secondary | ICD-10-CM | POA: Diagnosis not present

## 2021-04-22 DIAGNOSIS — R279 Unspecified lack of coordination: Secondary | ICD-10-CM | POA: Diagnosis not present

## 2021-04-22 DIAGNOSIS — M6281 Muscle weakness (generalized): Secondary | ICD-10-CM | POA: Diagnosis not present

## 2021-04-22 DIAGNOSIS — M6259 Muscle wasting and atrophy, not elsewhere classified, multiple sites: Secondary | ICD-10-CM | POA: Diagnosis not present

## 2021-04-22 DIAGNOSIS — E43 Unspecified severe protein-calorie malnutrition: Secondary | ICD-10-CM | POA: Diagnosis not present

## 2021-04-22 NOTE — Telephone Encounter (Signed)
Per pt daughter [name] he is in and home in [place] and she declined the AWV

## 2021-04-23 DIAGNOSIS — M6259 Muscle wasting and atrophy, not elsewhere classified, multiple sites: Secondary | ICD-10-CM | POA: Diagnosis not present

## 2021-04-23 DIAGNOSIS — R1312 Dysphagia, oropharyngeal phase: Secondary | ICD-10-CM | POA: Diagnosis not present

## 2021-04-23 DIAGNOSIS — E43 Unspecified severe protein-calorie malnutrition: Secondary | ICD-10-CM | POA: Diagnosis not present

## 2021-04-23 DIAGNOSIS — R279 Unspecified lack of coordination: Secondary | ICD-10-CM | POA: Diagnosis not present

## 2021-04-23 DIAGNOSIS — M6281 Muscle weakness (generalized): Secondary | ICD-10-CM | POA: Diagnosis not present

## 2021-04-24 ENCOUNTER — Non-Acute Institutional Stay: Payer: Medicare Other | Admitting: Primary Care

## 2021-04-24 ENCOUNTER — Other Ambulatory Visit: Payer: Self-pay

## 2021-04-24 DIAGNOSIS — M545 Low back pain, unspecified: Secondary | ICD-10-CM

## 2021-04-24 DIAGNOSIS — M6281 Muscle weakness (generalized): Secondary | ICD-10-CM | POA: Diagnosis not present

## 2021-04-24 DIAGNOSIS — R634 Abnormal weight loss: Secondary | ICD-10-CM

## 2021-04-24 DIAGNOSIS — Z515 Encounter for palliative care: Secondary | ICD-10-CM

## 2021-04-24 DIAGNOSIS — E43 Unspecified severe protein-calorie malnutrition: Secondary | ICD-10-CM

## 2021-04-24 DIAGNOSIS — G8929 Other chronic pain: Secondary | ICD-10-CM | POA: Diagnosis not present

## 2021-04-24 DIAGNOSIS — R279 Unspecified lack of coordination: Secondary | ICD-10-CM | POA: Diagnosis not present

## 2021-04-24 DIAGNOSIS — M6259 Muscle wasting and atrophy, not elsewhere classified, multiple sites: Secondary | ICD-10-CM | POA: Diagnosis not present

## 2021-04-24 DIAGNOSIS — R1312 Dysphagia, oropharyngeal phase: Secondary | ICD-10-CM | POA: Diagnosis not present

## 2021-04-24 NOTE — Progress Notes (Signed)
[facility] Consult Note Telephone: [phone]  Fax: [phone]    Date of encounter: 04/24/21 1:55 PM PATIENT NAME: Carl [name] [address] [address]   [phone] (home)  DOB: 09-18-1935 MRN: [id number] PRIMARY CARE PROVIDER:    Alvester Morin, MD,  [address]. [place] [address] [phone]  REFERRING PROVIDER:   Alvester Morin, MD [address]. [place],  [place] [address] [phone]  RESPONSIBLE PARTY:    Contact Information     Name Relation Home Work Mobile   Brisbane,Treyden [place]. Son [phone]  [phone]   KEYSEAN, SAVINO Daughter   [phone]   [place]ottam,Connie Daughter   [phone]        I met face to face with patient  in [facility] facility. Palliative Care was asked to follow this patient by consultation request of  Slade-Hartman, Ivette Loyal* to address advance care planning and complex medical decision making. This is a follow up visit.                                   ASSESSMENT AND PLAN / RECOMMENDATIONS:   Advance Care Planning/Goals of Care: Goals include to maximize quality of life and symptom management. Our advance care planning conversation included a discussion about:    Exploration of personal, cultural or spiritual beliefs that might influence medical decisions  CODE STATUS: DNR  Symptom Management/Plan:  Pain: Complains of pain in sacrum, and states he has open wounds. Please corroborate. Recommend alternating pressure mattress please for wounds.  Nutrition: Eating small amounts. Continues to lose weight and reports sacral wound.  Follow up Palliative Care Visit: Palliative care will continue to follow for complex medical decision making, advance care planning, and clarification of goals. Return 6-8 weeks or prn.  I spent 25 minutes providing this consultation. More than 50% of the time in this consultation was spent in  counseling and care coordination.  PPS: 30%  HOSPICE ELIGIBILITY/DIAGNOSIS: yes, with concordant goals of care.  Chief Complaint: debility  HISTORY OF PRESENT ILLNESS:  Carl [name] is a 85 y.o. year old male  with debility, protein calorie malnutrition , prostate cancer.   History obtained from review of EMR, discussion with primary team, and interview with family, facility staff/caregiver and/or Mr. Pitta.  I reviewed available labs, medications, imaging, studies and related documents from the EMR.  Records reviewed and summarized above.   ROS/staff  General: NAD ENMT: denies dysphagia Cardiovascular: denies chest pain, denies DOE Pulmonary: denies cough, denies increased SOB Abdomen: endorses poor  appetite, denies constipation, endorses incontinence of bowel GU: denies dysuria, endorses continence of urine MSK:  endorses  weakness,  no falls reported Skin: denies rashes or wounds Neurological: endorses sacral  pain, denies insomnia Psych: Endorses withdrawn  mood Heme/lymph/immuno: denies bruises, abnormal bleeding  Physical Exam: Current and past weights: 125 lbs, continued weight loss, 158 lbs 8 months ago, 20% Constitutional: NAD General: frail appearing, thin EYES: anicteric sclera, lids intact, no discharge  ENMT: intact hearing, oral mucous membranes dry, edentulous CV: S1S2, RRR, no LE edema Pulmonary: LCTA, no increased work of breathing, no cough, room air Abdomen: intake 25-50%, soft and non tender, no ascites GU: deferred MSK: ++ sarcopenia, moves all extremities, non ambulatory Skin: warm and dry, no rashes or wounds on visible skin Neuro:  ++ generalized weakness,  mild  cognitive impairment Psych: non-anxious affect, A and O x  1-2 Hem/lymph/immuno: no widespread bruising   Thank you for the opportunity to participate in the care of Carl [name].  The palliative care team will continue to follow. Please call our office at [phone] if we can be  of additional assistance.   Jason Coop, NP   COVID-19 PATIENT SCREENING TOOL Asked and negative response unless otherwise noted:   Have you had symptoms of covid, tested positive or been in contact with someone with symptoms/positive test in the past 5-10 days?

## 2021-05-01 DIAGNOSIS — E43 Unspecified severe protein-calorie malnutrition: Secondary | ICD-10-CM | POA: Diagnosis not present

## 2021-05-01 DIAGNOSIS — R1312 Dysphagia, oropharyngeal phase: Secondary | ICD-10-CM | POA: Diagnosis not present

## 2021-05-02 ENCOUNTER — Ambulatory Visit: Payer: Medicare Other

## 2021-05-30 DIAGNOSIS — R509 Fever, unspecified: Secondary | ICD-10-CM | POA: Diagnosis not present

## 2021-05-30 DIAGNOSIS — N19 Unspecified kidney failure: Secondary | ICD-10-CM | POA: Diagnosis not present

## 2021-05-31 ENCOUNTER — Non-Acute Institutional Stay: Payer: Medicare Other | Admitting: Primary Care

## 2021-05-31 ENCOUNTER — Other Ambulatory Visit: Payer: Self-pay

## 2021-05-31 DIAGNOSIS — R062 Wheezing: Secondary | ICD-10-CM | POA: Diagnosis not present

## 2021-05-31 DIAGNOSIS — M545 Low back pain, unspecified: Secondary | ICD-10-CM

## 2021-05-31 DIAGNOSIS — U071 COVID-19: Secondary | ICD-10-CM | POA: Diagnosis not present

## 2021-05-31 DIAGNOSIS — E43 Unspecified severe protein-calorie malnutrition: Secondary | ICD-10-CM

## 2021-05-31 DIAGNOSIS — G8929 Other chronic pain: Secondary | ICD-10-CM | POA: Diagnosis not present

## 2021-05-31 DIAGNOSIS — R634 Abnormal weight loss: Secondary | ICD-10-CM

## 2021-05-31 DIAGNOSIS — Z515 Encounter for palliative care: Secondary | ICD-10-CM | POA: Diagnosis not present

## 2021-05-31 DIAGNOSIS — R768 Other specified abnormal immunological findings in serum: Secondary | ICD-10-CM | POA: Diagnosis not present

## 2021-05-31 DIAGNOSIS — R1310 Dysphagia, unspecified: Secondary | ICD-10-CM | POA: Diagnosis not present

## 2021-05-31 NOTE — Progress Notes (Signed)
[facility] Consult Note Telephone: [phone]  Fax: [phone]    Date of encounter: 05/31/21 2:09 PM PATIENT NAME: Carl [name] [address] [place] [address]   [phone] (home)  DOB: 27-Apr-1936 MRN: [id number] PRIMARY CARE PROVIDER:    Alvester Morin, MD,  [place]. [place] [address] [phone]  REFERRING PROVIDER:   Alvester Morin, MD [address]. [place],  [place] [address] [phone]  RESPONSIBLE PARTY:    Contact Information     Name Relation Home Work Mobile   [name],Carl [place]. Son [phone]  [phone]   [name], Carl Daughter   [phone]   [name],Carl Daughter   [phone]        I met face to face with patient in [facility] facility. Palliative Care was asked to follow this patient by consultation request of  Carl [name]* to address advance care planning and complex medical decision making. This is a follow up visit.                                   ASSESSMENT AND PLAN / RECOMMENDATIONS:   Advance Care Planning/Goals of Care: Goals include to maximize quality of life and symptom management. Our advance care planning conversation included a discussion about:    The value and importance of advance care planning  Experiences with loved ones who have been seriously ill or have died  Exploration of personal, cultural or spiritual beliefs that might influence medical decisions  Exploration of goals of care in the event of a sudden injury or illness  Identification of a healthcare agent - Daughter Carl [name] Review and updating or creation of an  advance directive document . Discussed treating in place vs er, would better serve patient due to debility. Daughter is in agreement. CODE STATUS: DNR  Symptom Management/Plan:  Patient has covid 19 infection. He is in his bed today, having had lunch. He endorses feeling ok, no  s/sx discomfort. Receiving IV fluids, and staff will get CXR due to anterior rales. Patient is not ill appearing, but bp is running lower than normal at 90/60, would agree with f/u for pneumonia. Daughter endorses for patient to receive as much care as possible at SNF given goals of care . Discussed pt frailty and ability to treat in place for many appropriate interventions. Discussed pt frailty. Daughter lives some distance and does not get to visit often, no MOST on file but I would like to try to develop this with one of the other POAs.  Follow up Palliative Care Visit: Palliative care will continue to follow for complex medical decision making, advance care planning, and clarification of goals. Return 6 weeks or prn.  I spent 25 minutes providing this consultation. More than 50% of the time in this consultation was spent in counseling and care coordination.   PPS: 30%  HOSPICE ELIGIBILITY/DIAGNOSIS: TBD  Chief Complaint: debility  HISTORY OF PRESENT ILLNESS:  Carl [name] is a 85 y.o. year old male  with Covid infection, h/o dementia, lung nodule, h/o prostate cancer, abnormal weight loss .   History obtained from review of EMR, discussion with primary team, and interview with family, facility staff/caregiver and/or Carl [name].  I reviewed available labs, medications, imaging, studies and related documents from the EMR.  Records reviewed and summarized above.   ROS   General: NAD ENMT: denies dysphagia Cardiovascular: denies chest pain, denies DOE  Pulmonary: denies cough, denies increased SOB Abdomen: endorses good appetite, denies constipation, endorses incontinence of bowel GU: denies dysuria, endorses incontinence of urine MSK:  endorses  weakness,  no falls reported Skin: denies rashes or wounds Neurological: denies pain, denies insomnia Psych: Endorses positive mood Heme/lymph/immuno: denies bruises, abnormal bleeding  Physical Exam: Current and past weights: up 4  lbs to 120 lbs,  Constitutional: NAD General: frail appearing, thin EYES: anicteric sclera, lids intact, no discharge  ENMT: intact hearing, oral mucous membranes moist, edentulous CV: no LE edema Pulmonary: no increased work of breathing, no cough, room air Abdomen: intake 75%, no ascites GU: deferred MSK: + sarcopenia, moves all extremities slowly, non ambulatory, bed bound  Skin: warm and dry, no rashes or wounds on visible skin Neuro:  severe  generalized weakness,  + cognitive impairment Psych: non-anxious affect, A and O x 1-2 Hem/lymph/immuno: no widespread bruising   Thank you for the opportunity to participate in the care of Carl [name].  The palliative care team will continue to follow. Please call our office at [phone] if we can be of additional assistance.   Jason Coop, NP DNP, AGPCNP-BC  COVID-19 PATIENT SCREENING TOOL Asked and negative response unless otherwise noted:   Have you had symptoms of covid, tested positive or been in contact with someone with symptoms/positive test in the past 5-10 days?

## 2021-06-03 ENCOUNTER — Emergency Department: Payer: Medicare Other

## 2021-06-03 ENCOUNTER — Encounter: Payer: Self-pay | Admitting: Emergency Medicine

## 2021-06-03 ENCOUNTER — Inpatient Hospital Stay
Admission: EM | Admit: 2021-06-03 | Discharge: 2021-06-27 | DRG: 871 | Disposition: E | Payer: Medicare Other | Attending: Hospitalist | Admitting: Hospitalist

## 2021-06-03 ENCOUNTER — Other Ambulatory Visit: Payer: Self-pay

## 2021-06-03 DIAGNOSIS — G9341 Metabolic encephalopathy: Secondary | ICD-10-CM | POA: Diagnosis not present

## 2021-06-03 DIAGNOSIS — J1282 Pneumonia due to coronavirus disease 2019: Secondary | ICD-10-CM | POA: Diagnosis not present

## 2021-06-03 DIAGNOSIS — J9600 Acute respiratory failure, unspecified whether with hypoxia or hypercapnia: Secondary | ICD-10-CM | POA: Diagnosis present

## 2021-06-03 DIAGNOSIS — Z951 Presence of aortocoronary bypass graft: Secondary | ICD-10-CM

## 2021-06-03 DIAGNOSIS — I499 Cardiac arrhythmia, unspecified: Secondary | ICD-10-CM | POA: Diagnosis not present

## 2021-06-03 DIAGNOSIS — I252 Old myocardial infarction: Secondary | ICD-10-CM

## 2021-06-03 DIAGNOSIS — E8809 Other disorders of plasma-protein metabolism, not elsewhere classified: Secondary | ICD-10-CM | POA: Diagnosis present

## 2021-06-03 DIAGNOSIS — R627 Adult failure to thrive: Secondary | ICD-10-CM | POA: Diagnosis present

## 2021-06-03 DIAGNOSIS — Z888 Allergy status to other drugs, medicaments and biological substances status: Secondary | ICD-10-CM

## 2021-06-03 DIAGNOSIS — Z743 Need for continuous supervision: Secondary | ICD-10-CM | POA: Diagnosis not present

## 2021-06-03 DIAGNOSIS — Z681 Body mass index (BMI) 19 or less, adult: Secondary | ICD-10-CM

## 2021-06-03 DIAGNOSIS — Z515 Encounter for palliative care: Secondary | ICD-10-CM | POA: Diagnosis not present

## 2021-06-03 DIAGNOSIS — A419 Sepsis, unspecified organism: Secondary | ICD-10-CM | POA: Diagnosis present

## 2021-06-03 DIAGNOSIS — Z7901 Long term (current) use of anticoagulants: Secondary | ICD-10-CM

## 2021-06-03 DIAGNOSIS — J159 Unspecified bacterial pneumonia: Secondary | ICD-10-CM | POA: Diagnosis present

## 2021-06-03 DIAGNOSIS — I2510 Atherosclerotic heart disease of native coronary artery without angina pectoris: Secondary | ICD-10-CM | POA: Diagnosis not present

## 2021-06-03 DIAGNOSIS — Z66 Do not resuscitate: Secondary | ICD-10-CM | POA: Diagnosis present

## 2021-06-03 DIAGNOSIS — I130 Hypertensive heart and chronic kidney disease with heart failure and stage 1 through stage 4 chronic kidney disease, or unspecified chronic kidney disease: Secondary | ICD-10-CM | POA: Diagnosis present

## 2021-06-03 DIAGNOSIS — Z96652 Presence of left artificial knee joint: Secondary | ICD-10-CM | POA: Diagnosis not present

## 2021-06-03 DIAGNOSIS — A4189 Other specified sepsis: Secondary | ICD-10-CM | POA: Diagnosis not present

## 2021-06-03 DIAGNOSIS — N183 Chronic kidney disease, stage 3 unspecified: Secondary | ICD-10-CM | POA: Diagnosis not present

## 2021-06-03 DIAGNOSIS — Z96641 Presence of right artificial hip joint: Secondary | ICD-10-CM | POA: Diagnosis present

## 2021-06-03 DIAGNOSIS — Z8249 Family history of ischemic heart disease and other diseases of the circulatory system: Secondary | ICD-10-CM

## 2021-06-03 DIAGNOSIS — J9601 Acute respiratory failure with hypoxia: Secondary | ICD-10-CM | POA: Diagnosis present

## 2021-06-03 DIAGNOSIS — Z881 Allergy status to other antibiotic agents status: Secondary | ICD-10-CM

## 2021-06-03 DIAGNOSIS — R0602 Shortness of breath: Secondary | ICD-10-CM | POA: Diagnosis not present

## 2021-06-03 DIAGNOSIS — B965 Pseudomonas (aeruginosa) (mallei) (pseudomallei) as the cause of diseases classified elsewhere: Secondary | ICD-10-CM | POA: Diagnosis present

## 2021-06-03 DIAGNOSIS — T17908A Unspecified foreign body in respiratory tract, part unspecified causing other injury, initial encounter: Secondary | ICD-10-CM | POA: Diagnosis present

## 2021-06-03 DIAGNOSIS — E785 Hyperlipidemia, unspecified: Secondary | ICD-10-CM | POA: Diagnosis not present

## 2021-06-03 DIAGNOSIS — E43 Unspecified severe protein-calorie malnutrition: Secondary | ICD-10-CM | POA: Diagnosis not present

## 2021-06-03 DIAGNOSIS — U071 COVID-19: Secondary | ICD-10-CM | POA: Diagnosis not present

## 2021-06-03 DIAGNOSIS — M545 Low back pain, unspecified: Secondary | ICD-10-CM | POA: Diagnosis not present

## 2021-06-03 DIAGNOSIS — D509 Iron deficiency anemia, unspecified: Secondary | ICD-10-CM | POA: Diagnosis not present

## 2021-06-03 DIAGNOSIS — I255 Ischemic cardiomyopathy: Secondary | ICD-10-CM | POA: Diagnosis not present

## 2021-06-03 DIAGNOSIS — F419 Anxiety disorder, unspecified: Secondary | ICD-10-CM | POA: Diagnosis present

## 2021-06-03 DIAGNOSIS — I4811 Longstanding persistent atrial fibrillation: Secondary | ICD-10-CM | POA: Diagnosis not present

## 2021-06-03 DIAGNOSIS — L89152 Pressure ulcer of sacral region, stage 2: Secondary | ICD-10-CM | POA: Diagnosis present

## 2021-06-03 DIAGNOSIS — N390 Urinary tract infection, site not specified: Secondary | ICD-10-CM | POA: Diagnosis not present

## 2021-06-03 DIAGNOSIS — J189 Pneumonia, unspecified organism: Secondary | ICD-10-CM | POA: Diagnosis present

## 2021-06-03 DIAGNOSIS — R0689 Other abnormalities of breathing: Secondary | ICD-10-CM | POA: Diagnosis not present

## 2021-06-03 DIAGNOSIS — G8929 Other chronic pain: Secondary | ICD-10-CM | POA: Diagnosis not present

## 2021-06-03 DIAGNOSIS — I5032 Chronic diastolic (congestive) heart failure: Secondary | ICD-10-CM | POA: Diagnosis not present

## 2021-06-03 DIAGNOSIS — I714 Abdominal aortic aneurysm, without rupture, unspecified: Secondary | ICD-10-CM | POA: Diagnosis present

## 2021-06-03 DIAGNOSIS — C61 Malignant neoplasm of prostate: Secondary | ICD-10-CM | POA: Diagnosis present

## 2021-06-03 DIAGNOSIS — Z7401 Bed confinement status: Secondary | ICD-10-CM

## 2021-06-03 DIAGNOSIS — R531 Weakness: Secondary | ICD-10-CM

## 2021-06-03 DIAGNOSIS — R000 Tachycardia, unspecified: Secondary | ICD-10-CM | POA: Diagnosis not present

## 2021-06-03 DIAGNOSIS — L8990 Pressure ulcer of unspecified site, unspecified stage: Secondary | ICD-10-CM | POA: Diagnosis present

## 2021-06-03 DIAGNOSIS — Z87442 Personal history of urinary calculi: Secondary | ICD-10-CM

## 2021-06-03 DIAGNOSIS — R1310 Dysphagia, unspecified: Secondary | ICD-10-CM | POA: Diagnosis present

## 2021-06-03 DIAGNOSIS — Z87440 Personal history of urinary (tract) infections: Secondary | ICD-10-CM

## 2021-06-03 DIAGNOSIS — Z79899 Other long term (current) drug therapy: Secondary | ICD-10-CM

## 2021-06-03 DIAGNOSIS — Z87891 Personal history of nicotine dependence: Secondary | ICD-10-CM

## 2021-06-03 DIAGNOSIS — Z7189 Other specified counseling: Secondary | ICD-10-CM | POA: Diagnosis not present

## 2021-06-03 LAB — CBC WITH DIFFERENTIAL/PLATELET
Abs Immature Granulocytes: 0.09 10*3/uL — ABNORMAL HIGH (ref 0.00–0.07)
Basophils Absolute: 0 10*3/uL (ref 0.0–0.1)
Basophils Relative: 0 %
Eosinophils Absolute: 0 10*3/uL (ref 0.0–0.5)
Eosinophils Relative: 0 %
HCT: 33.7 % — ABNORMAL LOW (ref 39.0–52.0)
Hemoglobin: 11 g/dL — ABNORMAL LOW (ref 13.0–17.0)
Immature Granulocytes: 1 %
Lymphocytes Relative: 4 %
Lymphs Abs: 0.5 10*3/uL — ABNORMAL LOW (ref 0.7–4.0)
MCH: 30.1 pg (ref 26.0–34.0)
MCHC: 32.6 g/dL (ref 30.0–36.0)
MCV: 92.3 fL (ref 80.0–100.0)
Monocytes Absolute: 0.3 10*3/uL (ref 0.1–1.0)
Monocytes Relative: 3 %
Neutro Abs: 10.3 10*3/uL — ABNORMAL HIGH (ref 1.7–7.7)
Neutrophils Relative %: 92 %
Platelets: 199 10*3/uL (ref 150–400)
RBC: 3.65 MIL/uL — ABNORMAL LOW (ref 4.22–5.81)
RDW: 16.1 % — ABNORMAL HIGH (ref 11.5–15.5)
Smear Review: NORMAL
WBC: 11.1 10*3/uL — ABNORMAL HIGH (ref 4.0–10.5)
nRBC: 0 % (ref 0.0–0.2)

## 2021-06-03 LAB — LACTATE DEHYDROGENASE: LDH: 115 U/L (ref 98–192)

## 2021-06-03 LAB — COMPREHENSIVE METABOLIC PANEL
ALT: 12 U/L (ref 0–44)
AST: 21 U/L (ref 15–41)
Albumin: 2.3 g/dL — ABNORMAL LOW (ref 3.5–5.0)
Alkaline Phosphatase: 58 U/L (ref 38–126)
Anion gap: 7 (ref 5–15)
BUN: 32 mg/dL — ABNORMAL HIGH (ref 8–23)
CO2: 22 mmol/L (ref 22–32)
Calcium: 7.8 mg/dL — ABNORMAL LOW (ref 8.9–10.3)
Chloride: 105 mmol/L (ref 98–111)
Creatinine, Ser: 0.85 mg/dL (ref 0.61–1.24)
GFR, Estimated: >60 mL/min (ref >60)
Glucose, Bld: 123 mg/dL — ABNORMAL HIGH (ref 70–99)
Potassium: 3.8 mmol/L (ref 3.5–5.1)
Sodium: 134 mmol/L — ABNORMAL LOW (ref 135–145)
Total Bilirubin: 0.8 mg/dL (ref 0.3–1.2)
Total Protein: 6 g/dL — ABNORMAL LOW (ref 6.5–8.1)

## 2021-06-03 LAB — C-REACTIVE PROTEIN: CRP: 26.9 mg/dL — ABNORMAL HIGH (ref <1.0)

## 2021-06-03 LAB — FIBRINOGEN: Fibrinogen: 467 mg/dL (ref 210–475)

## 2021-06-03 LAB — TRIGLYCERIDES: Triglycerides: 68 mg/dL (ref <150)

## 2021-06-03 LAB — TROPONIN I (HIGH SENSITIVITY)
Troponin I (High Sensitivity): 19 ng/L — ABNORMAL HIGH (ref <18)
Troponin I (High Sensitivity): 16 ng/L (ref <18)

## 2021-06-03 LAB — BRAIN NATRIURETIC PEPTIDE: B Natriuretic Peptide: 147.6 pg/mL — ABNORMAL HIGH (ref 0.0–100.0)

## 2021-06-03 LAB — PROCALCITONIN: Procalcitonin: 1.15 ng/mL

## 2021-06-03 LAB — FERRITIN: Ferritin: 262 ng/mL (ref 24–336)

## 2021-06-03 LAB — LACTIC ACID, PLASMA
Lactic Acid, Venous: 1.9 mmol/L (ref 0.5–1.9)
Lactic Acid, Venous: 1.6 mmol/L (ref 0.5–1.9)

## 2021-06-03 MED ORDER — METHYLPREDNISOLONE SODIUM SUCC 125 MG IJ SOLR
125.0000 mg | Freq: Once | INTRAMUSCULAR | Status: AC
  Administered 2021-06-03: 125 mg via INTRAVENOUS
  Filled 2021-06-03: qty 2

## 2021-06-03 MED ORDER — DOCUSATE SODIUM 100 MG PO CAPS: 100.0000 mg | ORAL_CAPSULE | Freq: Two times a day (BID) | ORAL | Status: DC | PRN

## 2021-06-03 MED ORDER — SODIUM CHLORIDE 0.9 % IV SOLN
200.0000 mg | Freq: Once | INTRAVENOUS | Status: AC
  Administered 2021-06-03: 200 mg via INTRAVENOUS
  Filled 2021-06-03: qty 40

## 2021-06-03 MED ORDER — SODIUM CHLORIDE 0.9 % IV BOLUS
500.0000 mL | Freq: Once | INTRAVENOUS | Status: AC
  Administered 2021-06-03: 500 mL via INTRAVENOUS

## 2021-06-03 MED ORDER — POLYETHYLENE GLYCOL 3350 17 G PO PACK: 17.0000 g | PACK | Freq: Every day | ORAL | Status: DC | PRN

## 2021-06-03 MED ORDER — SODIUM CHLORIDE 0.9 % IV SOLN
100.0000 mg | Freq: Every day | INTRAVENOUS | Status: DC
  Administered 2021-06-04 – 2021-06-05 (×2): 100 mg via INTRAVENOUS
  Filled 2021-06-03: qty 20
  Filled 2021-06-03 (×2): qty 100

## 2021-06-03 NOTE — ED Triage Notes (Signed)
Pt arrives via [facility] from [facility].  Pt D/X with Covid on 11/4 and now on 4L O2, previously on RA. EMS states O2 sats down to 84 on the 4L and lethargy at facility. Pt alert at this time O2 sat 89 on 4L [place].

## 2021-06-03 NOTE — ED Notes (Signed)
Acute care ARNP at bedside.

## 2021-06-03 NOTE — ED Provider Notes (Signed)
[facility] Emergency Department Provider Note  ____________________________________________   Event Date/Time   First MD Initiated Contact with Patient 06/19/2021 1830     (approximate)  I have reviewed the triage vital signs    HISTORY  Chief Complaint Shortness of Breath    HPI Carl [name] is a 85 y.o. male with heart failure, CKD, coronary disease, A. fib on Eliquis who comes in with concerns for worsening shortness of breath.  Patient comes in from [place].  Patient was reportedly diagnosed with COVID on 11/4.  At that time patient was placed on 4 L nasal cannula is getting dexamethasone 6 mg 2 times daily since 11/4.  However patient was noted to have worsening shortness of breath, constant, severe.  Unable to get full HPI from patient due to his shortness of breath.              Past Medical History:  Diagnosis Date   (HFpEF) heart failure with preserved ejection fraction ([place])    a. 08/2016 Echo: EF 50-55%.   AAA (abdominal aortic aneurysm) 06/05/2019   abdominal aortic aneurysm measuring 3.1 cm.   Arthritis    arms   Bladder calculus    Chronic anemia    Chronic anxiety    CKD (chronic kidney disease), stage III ([place])    Coronary artery disease CARDIOLOGIST-  DR GOLLAN   a. CABG 2006 with  LIMA-LAD, SVG-[place], SVG-OM, seq SVG-PLV-PDA.   DOE (dyspnea on exertion)    Frequent falls 11/2019   GERD (gastroesophageal reflux disease)    Heart murmur    Hematuria    Hip fracture ([place]) 10/05/2018   History of bladder stone    History of colon polyps    History of non-ST elevation myocardial infarction (NSTEMI) 03/30/2005   post op recovery inguinal hernia repair   History of pressure ulcer    History of recurrent UTIs    last admission 08-04-2018 pseudomonas UTI   Hyperlipidemia    Hyperplasia of prostate without lower urinary tract symptoms (LUTS)    Hypertension    Indwelling Foley catheter present    Intracranial  hematoma 10/05/2018   Ischemic cardiomyopathy    a. echo (01/13) 45-50% septal akinesis, mild MR; b. Echo  (01/16) ef 60-65%, mid-apical anteroseptal hypokinesis; c. echo 09-16-2016 ef 50-55%   Left ureteral stone    Lung nodule    noted 08/ 2018;  followed by pcp   LVH (left ventricular hypertrophy) 09/16/2016   Moderate noted on ECHO   Moderate mitral regurgitation    a.08/2016 Echo: Mod MR/TR.   Orthostatic hypotension    PAC (premature atrial contraction)    Paroxysmal atrial flutter ([place])    a. dx 08/2016, started on amiodarone.   Persistent atrial fibrillation [facility]) followed by cardiologist-- dr Rockey Situ   a. dx AFib 08/2016 s/p DCCV 10/2016-->eliquis (CHA2DS2VASc = 5).   Pneumonia    Pulmonary hypertension ([place])    last echo in epic 09-16-2016   Recurrent prostate cancer ([place]) UROLOGIST-- DR BELL   s/p  radioactive prostate seed implants in 1997;    due to PSA rising pt has been doing lupron injection's few past several yrs at dr bell office   Renal calculi    bilateral per CT 06-18-2018   S/P CABG x 5 04/04/2005   LIMA to LAD,  SVG to Ramus,  SVG to OM,  seqSVG to PLV and PDA   Wears glasses     Patient Active Problem List  Diagnosis Date Noted   COVID-19 05/31/2021   Pressure injury of skin 09/17/2020   Aspiration pneumonia of both lower lobes due to gastric secretions (HCC)    Dysphagia    Hypernatremia    Hypokalemia    Hypophosphatasia    Severe sepsis ([place]) 09/10/2020   Acute respiratory failure with hypoxia (HCC) 09/10/2020   Closed left hip fracture ([place]) 09/02/2020   Transaminitis 09/02/2020   AF (paroxysmal atrial fibrillation) ([place]) 09/02/2020   History of UTI 02/14/2020   Constipation 02/14/2020   Iron deficiency anemia 01/17/2020   Near syncope    Protein-calorie malnutrition, severe 12/22/2019   Fall 12/19/2019   Weight loss 08/30/2019   Onychomycosis 08/30/2019   AAA (abdominal aortic aneurysm) 08/26/2018   Pseudomonas urinary tract infection  08/05/2018   Kidney stones 06/30/2018   Allergic rhinitis 04/26/2018   Urolithiasis 01/05/2018   Frequent urination 01/01/2018   Colon polyps 09/15/2017   GERD (gastroesophageal reflux disease) 09/15/2017   Wrist pain, acute, right 05/25/2017   Low back pain 03/24/2017   Lung nodule seen on imaging study: 7 mm nodule left midlung 03/13/2017   Physical deconditioning 03/13/2017   Normocytic anemia 03/13/2017   Fall at home, initial encounter 03/12/2017   Abdominal soreness 03/12/2017   Generalized weakness 03/12/2017   Leukocytosis 03/12/2017   Hyponatremia 03/12/2017   Orthostatic hypotension 02/16/2017   Anticoagulated 09/19/2016   Prostate CA ([place]) 09/04/2016   Longstanding persistent atrial fibrillation ([place]) 09/04/2016   Cardiomyopathy, ischemic 07/16/2012   Chronic kidney disease (CKD), stage III (moderate) ([place]) 07/16/2012   Essential hypertension 08/05/2011   Murmur 08/05/2011   Hx of CABG x 5 '06 02/04/2011   Dyslipidemia 02/04/2011    Past Surgical History:  Procedure Laterality Date   CARDIAC CATHETERIZATION  04/02/2005   dr Doreatha Lew    Critical three-vessel coronary disease --  Essentially normal left ventricular function , ef 55%   CARDIOVASCULAR STRESS TEST  05-01-2008  dr Doreatha Lew /  dr harding   Low risk nuclear study w/ no ischemia or scar but flattened septal motion/  ef 64%   CARDIOVERSION N/A 11/19/2016   Procedure: CARDIOVERSION;  Surgeon: Jerline Pain, MD;  [facility]COPY;  Service: Cardiovascular;  Laterality: N/A;   COLONOSCOPY N/A 10/23/2016   Procedure: COLONOSCOPY;  Surgeon: Milus Banister, MD;  Location: [facility] ENDOSCOPY;  Service: Endoscopy;  Laterality: N/A;   CORONARY ARTERY BYPASS GRAFT  04/04/2005   dr  Ricard Dillon   LIMA - LAD,  SVG - Ramus, SVG - OM,  seqSVG - PLV and PDA   CYSTOSCOPY WITH LITHOLAPAXY N/A 05/07/2016   Procedure: CYSTOSCOPY WITH LITHOLAPAXY;  Surgeon: Rana Snare, MD;  Location: [facility];  Service: Urology;   Laterality: N/A;   CYSTOSCOPY WITH RETROGRADE PYELOGRAM, URETEROSCOPY AND STENT PLACEMENT  09/03/2018   Procedure: CYSTOSCOPY WITH RETROGRADE PYELOGRAM, URETEROSCOPY LASER LITHOTRIPSY AND STENT PLACEMENT;  Surgeon: Lucas Mallow, MD;  Location: [facility] ORS;  Service: Urology;;   CYSTOSCOPY/URETEROSCOPY/HOLMIUM LASER/STENT PLACEMENT Right 06/15/2019   Procedure: CYSTOSCOPY RIGHT URETEROSCOPY HOLMIUM LASER STENT PLACEMENT;  Surgeon: Lucas Mallow, MD;  Location: [facility] ORS;  Service: Urology;  Laterality: Right;   HIP ARTHROPLASTY Left 09/03/2020   Procedure: ARTHROPLASTY BIPOLAR HIP (HEMIARTHROPLASTY);  Surgeon: Earnestine Leys, MD;  Location: [facility] ORS;  Service: Orthopedics;  Laterality: Left;   HOLMIUM LASER APPLICATION N/A 90/30/0923   Procedure: HOLMIUM LASER APPLICATION;  Surgeon: Rana Snare, MD;  Location: [facility];  Service: Urology;  Laterality:  N/A;   INGUINAL HERNIA REPAIR Left 03-30-2005  dr Excell Seltzer   incarcerated    [place]   TOTAL HIP ARTHROPLASTY Right 10/07/2018   Procedure: TOTAL HIP ARTHROPLASTY ANTERIOR APPROACH;  Surgeon: Rod Can, MD;  Location: [facility] ORS;  Service: Orthopedics;  Laterality: Right;   TRANSTHORACIC ECHOCARDIOGRAM  08/24/2014   mid anteroseptal and distal septak hypokinetic,  mild focal basal LVH, ef 60-65%,  grade 1 diastolic dysfunction/  mild MR/  mild LAE/  mild dilated RV with normal RVSP/  trivial TR    Prior to Admission medications   Medication Sig Start Date End Date Taking? Authorizing Provider  acetaminophen (TYLENOL) 500 MG tablet Take 500 mg by mouth every 8 (eight) hours as needed for moderate pain.    [provider]  acetaminophen (TYLENOL) 650 MG CR tablet Take 650 mg by mouth every 8 (eight) hours.    [provider]  apixaban (ELIQUIS) 5 MG TABS tablet Take 1 tablet (5 mg total) by mouth 2 (two) times daily. 05/03/20   [name], NP  darolutamide (NUBEQA)  300 MG tablet Take 2 tablets (600 mg total) by mouth 2 (two) times daily. 09/07/20   Fritzi Mandes, MD  diclofenac Sodium (VOLTAREN) 1 % GEL Apply 1 application topically 3 (three) times daily as needed. For pain 10/31/20   [provider]  ferrous sulfate 325 (65 FE) MG tablet Take 1 tablet (325 mg total) by mouth daily with breakfast. 09/07/20   Fritzi Mandes, MD  mirtazapine (REMERON SOL-TAB) 15 MG disintegrating tablet Take 15 mg by mouth at bedtime.    [provider]  Multiple Vitamin (MULTIVITAMIN WITH MINERALS) TABS tablet Take 1 tablet by mouth daily. 09/07/20   Fritzi Mandes, MD  omeprazole (PRILOSEC) 40 MG capsule TAKE 1 CAPSULE BY MOUTH EVERY DAY 09/18/20   [name], MD  senna (SENOKOT) 8.6 MG TABS tablet Take 1 tablet by mouth in the morning and at bedtime.    [provider]  tamsulosin (FLOMAX) 0.4 MG CAPS capsule Take 1 capsule (0.4 mg total) by mouth daily after supper. 01/06/18   [name], MD  traMADol (ULTRAM) 50 MG tablet Take 25 mg by mouth every 6 (six) hours.    [provider]    Allergies Ciprofloxacin and Ibuprofen  Family History  Problem Relation Age of Onset   Heart disease Mother    Cancer Mother    Cancer Father    Cancer Brother     Social History Social History   Tobacco Use   Smoking status: Former    Years: 2.00    Types: Cigarettes    Quit date: 07/28/1960    Years since quitting: 60.8   Smokeless tobacco: Never  Vaping Use   Vaping Use: Never used  Substance Use Topics   Alcohol use: No   Drug use: No      Review of Systems Unable to get full review of systems due to shortness of breath ____________________________________________   PHYSICAL EXAM:  VITAL SIGNS: ED Triage Vitals  Enc Vitals Group     BP 06/11/2021 1849 103/64     Pulse Rate 06/10/2021 1849 (!) 111     Resp 06/05/2021 1849 20     Temp 06/01/2021 1922 98.7 F (37.1 C)     Temp Source 05/30/2021 1922 Rectal     SpO2 06/02/2021 1840 96 %      Weight 06/16/2021 1851 130 lb 11.7 oz (59.3 kg)  Height 05/31/2021 1851 5\' 9"  (1.753 m)     Head Circumference --      Peak Flow --      Pain Score 06/17/2021 1849 10     Pain Loc --      Pain Edu? --      Excl. in [place]? --     Constitutional: Able to state name, elderly male Eyes: Conjunctivae are normal. EOMI. Head: Atraumatic. Nose: No congestion/rhinnorhea. Mouth/Throat: Mucous membranes are moist.   Neck: No stridor. Trachea Midline. FROM Cardiovascular: Tachycardic, regular rhythm.  Good peripheral circulation. Respiratory: no audible stridor, work of breathing  Gastrointestinal: Soft and nontender. No distention. Musculoskeletal: No lower extremity tenderness nor edema.  No joint effusions. Neurologic: Able to state his name but somewhat hard to understand but he is able to wiggle his toes and move his hands Skin:  Skin is warm, dry and intact.  Psychiatric: Unable to fully assess GU: Deferred   ____________________________________________   LABS (all labs ordered are listed, but only abnormal results are displayed)  Labs Reviewed  CBC WITH DIFFERENTIAL/PLATELET - Abnormal; Notable for the following components:      Result Value   WBC 11.1 (*)    RBC 3.65 (*)    Hemoglobin 11.0 (*)    HCT 33.7 (*)    RDW 16.1 (*)    All other components within normal limits  CULTURE, BLOOD (ROUTINE X 2)  CULTURE, BLOOD (ROUTINE X 2)  LACTIC ACID, PLASMA  LACTIC ACID, PLASMA  COMPREHENSIVE METABOLIC PANEL  PROCALCITONIN  LACTATE DEHYDROGENASE  FERRITIN  TRIGLYCERIDES  FIBRINOGEN  C-REACTIVE PROTEIN  BRAIN NATRIURETIC PEPTIDE  URINALYSIS, ROUTINE W REFLEX MICROSCOPIC  TROPONIN I (HIGH SENSITIVITY)   ____________________________________________   ED ECG REPORT I, Vanessa Trafalgar, the attending physician, personally viewed and interpreted this ECG.  Atrial fibrillation rate of 102, no ST elevation, no T wave versions, normal  intervals ____________________________________________  RADIOLOGY Robert Bellow, personally viewed and evaluated these images (plain radiographs) as part of my medical decision making, as well as reviewing the written report by the radiologist.  ED MD interpretation: Some infiltrates bilaterally concerning for COVID-pneumonia Official radiology report(s): DG Chest Port 1 View  Result Date: 06/24/2021 CLINICAL DATA:  Shortness of breath EXAM: PORTABLE CHEST 1 VIEW COMPARISON:  10/15/2020, 09/10/2020 CT FINDINGS: Post sternotomy changes. Incompletely included lung bases. Diffuse bilateral right greater than left interstitial and alveolar disease. Normal cardiac size with aortic atherosclerosis. No pneumothorax IMPRESSION: Interval right greater than left interstitial and alveolar disease, favored to represent pneumonia Electronically Signed   By: Donavan Foil M.D.   On: 06/10/2021 20:18    ____________________________________________   PROCEDURES  Procedure(s) performed (including Critical Care):  .1-3 Lead EKG Interpretation Performed by: Vanessa Wilmont, MD Authorized by: Vanessa Bartlett, MD     Interpretation: abnormal     ECG rate:  90-100s   ECG rate assessment: tachycardic     Rhythm: atrial fibrillation     Ectopy: none     Conduction: normal   .Critical Care Performed by: Vanessa De Leon, MD Authorized by: Vanessa , MD   Critical care provider statement:    Critical care time (minutes):  45   Critical care was necessary to treat or prevent imminent or life-threatening deterioration of the following conditions:  Respiratory failure   Critical care was time spent personally by me on the following activities:  Blood draw for specimens, development of treatment plan with patient or surrogate,  evaluation of patient's response to treatment, examination of patient, obtaining history from patient or surrogate, ordering and performing treatments and interventions, pulse oximetry,  re-evaluation of patient's condition, ordering and review of radiographic studies and ordering and review of laboratory studies   Care discussed with: admitting provider     ____________________________________________   INITIAL IMPRESSION / ASSESSMENT AND PLAN / ED COURSE  [name] was evaluated in Emergency Department on 06/14/2021 for the symptoms described in the history of present illness. He was evaluated in the context of the global COVID-19 pandemic, which necessitated consideration that the patient might be at risk for infection with the SARS-CoV-2 virus that causes COVID-19. Institutional protocols and algorithms that pertain to the evaluation of patients at risk for COVID-19 are in a state of rapid change based on information released by regulatory bodies including the CDC and federal and state organizations. These policies and algorithms were followed during the patient's care in the ED.     Pt presents with SOB. Suspect could be secondary to COVID. Will obtain testing and get xray to evaluate for PNA, EKG/trop to evaluate for ACS. No H/o Heart Failure. Lower suspicion for PE at this time given lower risk and patient already on blood thinner.  Will get labs to evaluate for dehydration and electrolyte abnormalities.    Initially patient was on 6 L of oxygen continue to have desats therefore placed on high flow nasal cannula as well as a nonrebreather.  Patient having some intermittent low blood pressures and given 2 500 cc fluid boluses which have seemed to help.  Patient will be started on some Solu-Medrol, remdesivir for his known COVID.  Reevaluated patient.  I did discuss with patient's son.  Patient is DNR.  They are not sure if they would want any pressors done but most likely would not want an intervention where they had to put a catheter in the neck.  However they would like to be called if that was getting to that point to further discuss.  's are otherwise reassuring  at this time.  Low suspicion for sepsis, bacteremia given this all seems to be filled by the COVID.    We will discussed with ICU team for admission        ____________________________________________   FINAL CLINICAL IMPRESSION(S) / ED DIAGNOSES   Final diagnoses:  Pneumonia due to COVID-19 virus  Acute respiratory failure with hypoxia ([place])      MEDICATIONS GIVEN DURING THIS VISIT:  Medications  remdesivir 200 mg in sodium chloride 0.9% 250 mL IVPB (has no administration in time range)    Followed by  remdesivir 100 mg in sodium chloride 0.9 % 100 mL IVPB (has no administration in time range)  sodium chloride 0.9 % bolus 500 mL (0 mLs Intravenous Stopped 06/04/2021 2042)  methylPREDNISolone sodium succinate (SOLU-MEDROL) 125 mg/2 mL injection 125 mg (125 mg Intravenous Given 06/04/2021 2048)  sodium chloride 0.9 % bolus 500 mL (500 mLs Intravenous New Bag/Given 05/31/2021 2048)     ED Discharge Orders     None        Note:  This document was prepared using Dragon voice recognition software and may include unintentional dictation errors.   Vanessa [place], MD 06/26/2021 2052

## 2021-06-03 NOTE — ED Notes (Signed)
MD notified of persistent hypotension despite fluid boluses.

## 2021-06-03 NOTE — ED Notes (Addendum)
MD notified of hypotension & hypoxia despite 6L via [place].    RT called for highflow.     Mepilex dressing on coccyx r/t pressure ulcer - Seizure pads placed on bed for skin integrity. Pt propped on side with pillows & heel protectors.

## 2021-06-03 NOTE — Consult Note (Signed)
Remdesivir - Pharmacy Brief Note  Covid Dx on 11/4; previously ORA now requiring supplemental O2. O:  ALT: NNL but BL WNL CXR: Result pending read SpO2: 87% on 4L [place] >> HFNC   A/P:  Remdesivir 200 mg IVPB once followed by 100 mg IVPB daily x 4 days.   Lorna Dibble, PharmD, [facility] Clinical Pharmacist 06/02/2021 7:46 PM

## 2021-06-03 NOTE — ED Notes (Signed)
Attempt to collect x2 - Lab called - will send tech.

## 2021-06-03 NOTE — ED Notes (Signed)
Report given to [facility] - All questions answered.

## 2021-06-03 NOTE — ED Notes (Signed)
This RN received report from [employer] fast streamer at [employer].

## 2021-06-03 NOTE — H&P (Signed)
NAME:  Carl [name], MRN:  [id number], DOB:  Jul 07, 1936, LOS: 0 ADMISSION DATE:  06/20/2021, CONSULTATION DATE:  06/20/2021 REFERRING MD:  Loraine Leriche MD CHIEF COMPLAINT: SOB   HPI  85 y.o with significant multiple PMH as below who presented to the ED via [facility] from [place] with progressive SOB.   Pt D/X with Covid on 11/4 and now on 4L O2, previously on RA. EMS states O2 sats down to 84 on the 4L and lethargy at facility.  ED Course: On arrival to the ED, he was afebrile with blood pressure 103/64 mm Hg and pulse rate 111 beats/min, RR 20 breath per minute, sats 96% 6L.  Patient continued to use to desat on 6 L of oxygen via nasal cannula therefore placed on high flow nasal cannula as well as nonrebreather.  He was also noted with intermittent low blood pressure and was given 500 cc IV fluid bolus x2 with some improvement.  Patient was started on home Solu-Medrol, and remdesivir for his known COVID.  Labs and imaging obtained as follows: Pertinent Labs in Red/Diagnostics Findings: Na+/ K+: 134/3.8 Glucose: 123 BUN/Cr.: 32/0.85 Calcium: 7.8 AST/ALT: 21/12   WBC/ TMAX: 11.1/ afebrile Hgb/Hct: 11.0/33.7 Plts: 199 PCT: 1.15 Lactic acid: 1.9 CRP: 26.9 COVID PCR: positive   Troponin: 19 BNP: 147.6  EKG: Atrial fibrillation rate of 102, no ST elevation, no T wave versions, normal intervals Imaging : CXR> Interval right greater than left interstitial and alveolar disease, favored to represent pneumonia  PCCM consulted for admission and further management.  Past Medical History    HFpEF) heart failure with preserved ejection fraction ([place]   a. 08/2016 Echo: EF 50-55%.  AAA (abdominal aortic aneurysm) ([place])    CT abd. dated 06-18-2018 in epic,  3.1cm;  followed by pcp  Arthritis    arms  Bladder calculus    Chronic anemia    Chronic anxiety    CKD (chronic kidney disease), stage III ([place])    Coronary artery disease CARDIOLOGIST-  DR GOLLAN  a. CABG 2006 with   LIMA-LAD, SVG-Ramus, SVG-OM, seq SVG-PLV-PDA.  DOE (dyspnea on exertion)    GERD (gastroesophageal reflux disease)    Hematuria    History of bladder stone    History of non-ST elevation myocardial infarction (NSTEMI) 03/30/2005  post op recovery inguinal hernia repair  History of recurrent UTIs    last admission 08-04-2018 pseudomonas UTI  Hyperlipidemia    Hyperplasia of prostate without lower urinary tract symptoms (LUTS)    Hypertension    Ischemic cardiomyopathy    a. echo (01/13) 45-50% septal akinesis, mild MR; b. Echo  (01/16) ef 60-65%, mid-apical anteroseptal hypokinesis; c. echo 09-16-2016 ef 50-55%  Left ureteral stone    Lung nodule    noted 08/ 2018;  followed by pcp  Moderate mitral regurgitation    a.08/2016 Echo: Mod MR/TR.  Orthostatic hypotension    PAC (premature atrial contraction)    Paroxysmal atrial flutter ([place])    a. dx 08/2016, started on amiodarone.  Persistent atrial fibrillation followed by cardiologist-- dr Rockey Situ  a. dx AFib 08/2016 s/p DCCV 10/2016-->eliquis (CHA2DS2VASc = 5).  Pulmonary hypertension ([place])    last echo in epic 09-16-2016  Recurrent prostate cancer ([place]) UROLOGIST-- DR BELL  s/p  radioactive prostate seed implants in 1997;    due to PSA rising pt has been doing lupron injection's few past several yrs at dr bell office  Renal calculi    bilateral per CT 06-18-2018  S/P CABG x 5 04/04/2005  LIMA to LAD,  SVG to Ramus,  SVG to OM,  seqSVG to PLV and PDA  Wears glasses    Significant Hospital Events   06/25/2021: Admitted to ICU with acute hypoxic respiratory failure secondary to COVID-pneumonia  Consults:  PCCM  Procedures:  None  Significant Diagnostic Tests:  11/7: Chest Xray> Interval right greater than left interstitial and alveolar disease, favored to represent pneumonia  Micro Data:  11/7: SARS-CoV-2 PCR> POSITIVE 11/7: Influenza PCR> negative 11/7: Blood culture x2> 11/7: Urine Culture> 11/7: MRSA PCR>>  11/7: Strep  pneumo urinary antigen> 11/7: Legionella urinary antigen>  Antimicrobials:  Azithromycin 11/8> Ceftriaxone 11/8> OBJECTIVE  Blood pressure 124/82, pulse 96, temperature (!) 96.1 F (35.6 C), temperature source Axillary, resp. rate (!) 26, height 5\' 9"  (1.753 m), weight 59.3 kg, SpO2 100 %.        Intake/Output Summary (Last 24 hours) at 05/30/2021 2349 Last data filed at 06/09/2021 2157 Gross per 24 hour  Intake 1250 ml  Output --  Net 1250 ml   Filed Weights   06/18/2021 1851  Weight: 59.3 kg   Physical Examination  GENERAL: 85 year-old pale appearing critically ill patient lying in the bed with no acute distress.  EYES: Pupils equal, round, reactive to light and accommodation. No scleral icterus. Extraocular muscles intact.  HEENT: Head atraumatic, normocephalic. Oropharynx and nasopharynx clear.  NECK:  Supple, no jugular venous distention. No thyroid enlargement, no tenderness.  LUNGS: Decreased breath sounds bilaterally, no wheezing, rales,rhonchi or crepitation. No use of accessory muscles of respiration.  CARDIOVASCULAR: S1, S2 normal. No murmurs, rubs, or gallops.  ABDOMEN: Soft, nontender, nondistended. Bowel sounds present. No organomegaly or mass.  EXTREMITIES: No pedal edema, cyanosis, or clubbing.  NEUROLOGIC: Cranial nerves II through XII are intact.  Muscle strength not assessed. Sensation intact. Gait not checked.  PSYCHIATRIC: The patient is alert and oriented x 1.  SKIN: No obvious rash, lesion, Sacral pressure ulcer, healing wound on left knee  Labs/imaging that I havepersonally reviewed  (right click and "Reselect all SmartList Selections" daily)     Labs   CBC: Recent Labs  Lab 06/16/2021 1850  WBC 11.1*  NEUTROABS 10.3*  HGB 11.0*  HCT 33.7*  MCV 92.3  PLT 814    Basic Metabolic Panel: Recent Labs  Lab 06/23/2021 1850  NA 134*  K 3.8  CL 105  CO2 22  GLUCOSE 123*  BUN 32*  CREATININE 0.85  CALCIUM 7.8*   GFR: Estimated Creatinine  Clearance: 55.2 mL/min (by C-G formula based on SCr of 0.85 mg/dL). Recent Labs  Lab 05/28/2021 1850 06/19/2021 2119  PROCALCITON 1.15  --   WBC 11.1*  --   LATICACIDVEN 1.9 1.6    Liver Function Tests: Recent Labs  Lab 06/16/2021 1850  AST 21  ALT 12  ALKPHOS 58  BILITOT 0.8  PROT 6.0*  ALBUMIN 2.3*   No results for input(s): LIPASE, AMYLASE in the last 168 hours. No results for input(s): AMMONIA in the last 168 hours.  ABG    Component Value Date/Time   TCO2 23 05/07/2016 0912     Coagulation Profile: No results for input(s): INR, PROTIME in the last 168 hours.  Cardiac Enzymes: No results for input(s): CKTOTAL, CKMB, CKMBINDEX, TROPONINI in the last 168 hours.  HbA1C: Hgb A1c MFr Bld  Date/Time Value Ref Range Status  06/18/2018 04:11 PM 5.2 <5.7 % of total Hgb Final    Comment:    For  the purpose of screening for the presence of diabetes: . <5.7%       Consistent with the absence of diabetes 5.7-6.4%    Consistent with increased risk for diabetes             (prediabetes) > or =6.5%  Consistent with diabetes . This assay result is consistent with a decreased risk of diabetes. . Currently, no consensus exists regarding use of hemoglobin A1c for diagnosis of diabetes in children. . According to American Diabetes Association (ADA) guidelines, hemoglobin A1c <7.0% represents optimal control in non-pregnant diabetic patients. Different metrics may apply to specific patient populations.  Standards of Medical Care in Diabetes(ADA). [name]   03/12/2017 04:37 PM 5.5 4.8 - 5.6 % Final    Comment:    (NOTE) Pre diabetes:          5.7%-6.4% Diabetes:              >6.4% Glycemic control for   <7.0% adults with diabetes     CBG: No results for input(s): GLUCAP in the last 168 hours.  Review of Systems:   UNABLE TO OBTAIN DUE TO POOR HISTORIAN  Past Medical History  He,  has a past medical history of (HFpEF) heart failure with preserved ejection fraction  ([place]), AAA (abdominal aortic aneurysm) (06/05/2019), Arthritis, Bladder calculus, Chronic anemia, Chronic anxiety, CKD (chronic kidney disease), stage III ([place]), Coronary artery disease (CARDIOLOGIST-  DR Rockey Situ), DOE (dyspnea on exertion), Frequent falls (11/2019), GERD (gastroesophageal reflux disease), Heart murmur, Hematuria, Hip fracture ([place]) (10/05/2018), History of bladder stone, History of colon polyps, History of non-ST elevation myocardial infarction (NSTEMI) (03/30/2005), History of pressure ulcer, History of recurrent UTIs, Hyperlipidemia, Hyperplasia of prostate without lower urinary tract symptoms (LUTS), Hypertension, Indwelling Foley catheter present, Intracranial hematoma (10/05/2018), Ischemic cardiomyopathy, Left ureteral stone, Lung nodule, LVH (left ventricular hypertrophy) (09/16/2016), Moderate mitral regurgitation, Orthostatic hypotension, PAC (premature atrial contraction), Paroxysmal atrial flutter ([place]), Persistent atrial fibrillation ([place]) (followed by cardiologist-- dr Rockey Situ), Pneumonia, Pulmonary hypertension ([place]), Recurrent prostate cancer ([place]) (UROLOGIST-- DR BELL), Renal calculi, S/P CABG x 5 (04/04/2005), and Wears glasses.   Surgical History    Past Surgical History:  Procedure Laterality Date   CARDIAC CATHETERIZATION  04/02/2005   dr Doreatha Lew    Critical three-vessel coronary disease --  Essentially normal left ventricular function , ef 55%   CARDIOVASCULAR STRESS TEST  05-01-2008  dr Doreatha Lew /  dr harding   Low risk nuclear study w/ no ischemia or scar but flattened septal motion/  ef 64%   CARDIOVERSION N/A 11/19/2016   Procedure: CARDIOVERSION;  Surgeon: Jerline Pain, MD;  [facility] END[facility]COPY;  Service: Cardiovascular;  Laterality: N/A;   COLONOSCOPY N/A 10/23/2016   Procedure: COLONOSCOPY;  Surgeon: Milus Banister, MD;  Location: [facility] ENDOSCOPY;  Service: Endoscopy;  Laterality: N/A;   CORONARY ARTERY BYPASS GRAFT  04/04/2005   dr  Ricard Dillon   LIMA - LAD,  SVG -  Ramus, SVG - OM,  seqSVG - PLV and PDA   CYSTOSCOPY WITH LITHOLAPAXY N/A 05/07/2016   Procedure: CYSTOSCOPY WITH LITHOLAPAXY;  Surgeon: Rana Snare, MD;  Location[facility];  Service: Urology;  Laterality: N/A;   CYSTOSCOPY WITH RETROGRADE PYELOGRAM, URETEROSCOPY AND STENT PLACEMENT  09/03/2018   Procedure: CYSTOSCOPY WITH RETROGRADE PYELOGRAM, URETEROSCOPY LASER LITHOTRIPSY AND STENT PLACEMENT;  Surgeon: Lucas Mallow, MD;  Location: [facility] ORS;  Service: Urology;;   CYSTOSCOPY/URETEROSCOPY/HOLMIUM LASER/STENT PLACEMENT Right 06/15/2019   Procedure: CYSTOSCOPY RIGHT URETEROSCOPY HOLMIUM LASER  STENT PLACEMENT;  Surgeon: Lucas Mallow, MD;  Location: [facility] ORS;  Service: Urology;  Laterality: Right;   HIP ARTHROPLASTY Left 09/03/2020   Procedure: ARTHROPLASTY BIPOLAR HIP (HEMIARTHROPLASTY);  Surgeon: Earnestine Leys, MD;  Location: [facility] ORS;  Service: Orthopedics;  Laterality: Left;   HOLMIUM LASER APPLICATION N/A 36/46/8032   Procedure: HOLMIUM LASER APPLICATION;  Surgeon: Rana Snare, MD;  [facility];  Service: Urology;  Laterality: N/A;   INGUINAL HERNIA REPAIR Left 03-30-2005  dr Excell Seltzer   incarcerated    [place] Right 10/07/2018   Procedure: TOTAL HIP ARTHROPLASTY ANTERIOR APPROACH;  Surgeon: Rod Can, MD;  Location: [facility] ORS;  Service: Orthopedics;  Laterality: Right;   TRANSTHORACIC ECHOCARDIOGRAM  08/24/2014   mid anteroseptal and distal septak hypokinetic,  mild focal basal LVH, ef 60-65%,  grade 1 diastolic dysfunction/  mild MR/  mild LAE/  mild dilated RV with normal RVSP/  trivial TR     Social History   reports that he quit smoking about 60 years ago. His smoking use included cigarettes. He has never used smokeless tobacco. He reports that he does not drink alcohol and does not use drugs.   Family History   His family history includes Cancer in his brother, father, and  mother; Heart disease in his mother.   Allergies Allergies  Allergen Reactions   Ciprofloxacin Other (See Comments)    PER HEART DR   Ibuprofen Other (See Comments)    Kidney issues     Home Medications  Prior to Admission medications   Medication Sig Start Date End Date Taking? Authorizing Provider  acetaminophen (TYLENOL) 650 MG CR tablet Take 650 mg by mouth every 8 (eight) hours.   Yes [provider]  apixaban (ELIQUIS) 5 MG TABS tablet Take 1 tablet (5 mg total) by mouth 2 (two) times daily. 05/03/20  Yes [name], NP  darolutamide (NUBEQA) 300 MG tablet Take 2 tablets (600 mg total) by mouth 2 (two) times daily. 09/07/20  Yes Fritzi Mandes, MD  dexamethasone (DECADRON) 6 MG tablet Take 6 mg by mouth 2 (two) times daily with a meal.   Yes [provider]  ferrous sulfate 325 (65 FE) MG tablet Take 1 tablet (325 mg total) by mouth daily with breakfast. 09/07/20  Yes Fritzi Mandes, MD  guaiFENesin (MUCINEX) 600 MG 12 hr tablet Take 600 mg by mouth 2 (two) times daily.   Yes [provider]  mirtazapine (REMERON SOL-TAB) 15 MG disintegrating tablet Take 15 mg by mouth at bedtime.   Yes [provider]  Multiple Vitamin (MULTIVITAMIN WITH MINERALS) TABS tablet Take 1 tablet by mouth daily. 09/07/20  Yes Fritzi Mandes, MD  omeprazole (PRILOSEC) 40 MG capsule TAKE 1 CAPSULE BY MOUTH EVERY DAY 09/18/20  Yes [name], MD  senna (SENOKOT) 8.6 MG TABS tablet Take 1 tablet by mouth in the morning and at bedtime.   Yes [provider]  tamsulosin (FLOMAX) 0.4 MG CAPS capsule Take 1 capsule (0.4 mg total) by mouth daily after supper. 01/06/18  Yes [name], MD  traMADol (ULTRAM) 50 MG tablet Take 25 mg by mouth every 6 (six) hours.   Yes [provider]  acetaminophen (TYLENOL) 500 MG tablet Take 500 mg by mouth every 8 (eight) hours as needed for moderate pain.    [provider]  diclofenac Sodium (VOLTAREN) 1 % GEL  Apply 1 application topically 3 (  three) times daily as needed. For pain 10/31/20   [provider]  Scheduled Meds: Continuous Infusions:  remdesivir 100 mg in NS 100 mL     PRN Meds:.docusate sodium, polyethylene glycol  Assessment & Plan:  Acute Hypoxic Respiratory Failure secondary to COVID-19 Pneumonia  Tested COVID + on 11/4 at the facility -Supplemental O2 as needed to maintain O2 saturations 88 to 92% -Follow intermittent ABG and chest x-ray as needed -Ensure adequate pulmonary hygiene  -F/u cultures, trend PCT, wbc, monitor fever curve -As needed bronchodilators -Continue remdesivir x5 days  -Continue steroids. -Start Empiric abx coverage given risk factors with Ceftriaxone + Azithromycin pending cultures, may broaden if appropriate -Encourage OOB, IS, FV, and awake proning if able -Continue airborne, contact precautions for 21 days from positive testing. -Monitor CMP and inflammatory markers  Hypertension Hyperlipidemia PMH x: Coronary Artery Disease status post 5 vessel bypass in 2006, ischemic cardiomyopathy with recovery of LV function -Continuous cardiac monitoring -Maintain MAP greater than 65   Persistent atrial fibrillation -Continue home eliquis 5mg  BID   Physical deconditioning Recent Hx of left hip fracture status post left knee arthroplasty -Has been taking his Eliquis for anticoagulation  -PT/OT consult -Palliative following  Chronic low back pain -Continue home tramadol as needed   Prostate cancer -Continue home darolutamide  Best practice:  Diet:  Oral Pain/Anxiety/Delirium protocol (if indicated): No VAP protocol (if indicated): Not indicated DVT prophylaxis: Systemic AC GI prophylaxis: PPI Glucose control:  SSI No Central venous access:  N/A Arterial line:  N/A Foley:  Yes, and it is still needed Mobility:  bed rest  PT consulted: Yes Last date of multidisciplinary goals of care discussion [11/7] Code Status:  DNR Disposition:  ICU   = Goals of Care = Code Status Order: DNR  Primary Emergency Contact: Ryker,Cambren Jr., Home Phone: [phone] Patient wishes to pursue ongoing treatment, but concurred that if deteriorated to pulselessness, patient would prefer a natural death as opposed to invasive measures such as CPR and intubation.  Family would wish to be immediately contacted in such situations.  Critical care time: 45 minutes     Rufina Falco, DNP, CCRN, FNP-C, AGACNP-BC Acute Care Nurse Practitioner  [facility] Pager: [phone] [place] [facility]  .

## 2021-06-04 DIAGNOSIS — U071 COVID-19: Secondary | ICD-10-CM | POA: Diagnosis not present

## 2021-06-04 DIAGNOSIS — J1282 Pneumonia due to coronavirus disease 2019: Secondary | ICD-10-CM | POA: Diagnosis not present

## 2021-06-04 DIAGNOSIS — J9600 Acute respiratory failure, unspecified whether with hypoxia or hypercapnia: Secondary | ICD-10-CM | POA: Diagnosis not present

## 2021-06-04 LAB — BLOOD CULTURE ID PANEL (REFLEXED) - BCID2

## 2021-06-04 LAB — URINALYSIS, ROUTINE W REFLEX MICROSCOPIC
Bilirubin Urine: NEGATIVE
Glucose, UA: NEGATIVE mg/dL
Ketones, ur: NEGATIVE mg/dL
Nitrite: NEGATIVE
Protein, ur: 30 mg/dL — AB
RBC / HPF: >50 RBC/hpf — ABNORMAL HIGH (ref 0–5)
Specific Gravity, Urine: 1.028 (ref 1.005–1.030)
Squamous Epithelial / HPF: NONE SEEN (ref 0–5)
pH: 5 (ref 5.0–8.0)

## 2021-06-04 LAB — PHOSPHORUS: Phosphorus: 3.4 mg/dL (ref 2.5–4.6)

## 2021-06-04 LAB — GLUCOSE, CAPILLARY: Glucose-Capillary: 120 mg/dL — ABNORMAL HIGH (ref 70–99)

## 2021-06-04 LAB — MAGNESIUM: Magnesium: 2 mg/dL (ref 1.7–2.4)

## 2021-06-04 MED ORDER — CHLORHEXIDINE GLUCONATE CLOTH 2 % EX PADS
6.0000 | MEDICATED_PAD | Freq: Every day | CUTANEOUS | Status: DC
  Administered 2021-06-04 – 2021-06-05 (×2): 6 via TOPICAL

## 2021-06-04 MED ORDER — HYDROCOD POLST-CPM POLST ER 10-8 MG/5ML PO SUER: 5.0000 mL | Freq: Two times a day (BID) | ORAL | Status: DC | PRN

## 2021-06-04 MED ORDER — DEXAMETHASONE SODIUM PHOSPHATE 10 MG/ML IJ SOLN
6.0000 mg | INTRAMUSCULAR | Status: DC
  Administered 2021-06-04 – 2021-06-05 (×2): 6 mg via INTRAVENOUS
  Filled 2021-06-04 (×3): qty 0.6

## 2021-06-04 MED ORDER — ORAL CARE MOUTH RINSE
15.0000 mL | Freq: Two times a day (BID) | OROMUCOSAL | Status: DC
  Administered 2021-06-04 – 2021-06-05 (×4): 15 mL via OROMUCOSAL

## 2021-06-04 MED ORDER — ADULT MULTIVITAMIN W/MINERALS CH: 1.0000 | ORAL_TABLET | Freq: Every day | ORAL | Status: DC

## 2021-06-04 MED ORDER — TRAMADOL HCL 50 MG PO TABS: 25.0000 mg | ORAL_TABLET | Freq: Four times a day (QID) | ORAL | Status: DC

## 2021-06-04 MED ORDER — ASCORBIC ACID 500 MG PO TABS: 500.0000 mg | ORAL_TABLET | Freq: Every day | ORAL | Status: DC

## 2021-06-04 MED ORDER — SODIUM CHLORIDE 0.9 % IV SOLN
1.0000 g | INTRAVENOUS | Status: DC
  Administered 2021-06-04 – 2021-06-05 (×2): 1 g via INTRAVENOUS
  Filled 2021-06-04 (×2): qty 10
  Filled 2021-06-04: qty 1

## 2021-06-04 MED ORDER — SENNA 8.6 MG PO TABS: 1.0000 | ORAL_TABLET | Freq: Every day | ORAL | Status: DC

## 2021-06-04 MED ORDER — GUAIFENESIN-DM 100-10 MG/5ML PO SYRP: 10.0000 mL | ORAL_SOLUTION | ORAL | Status: DC | PRN

## 2021-06-04 MED ORDER — MIRTAZAPINE 15 MG PO TBDP
15.0000 mg | ORAL_TABLET | Freq: Every day | ORAL | Status: DC
  Filled 2021-06-04 (×3): qty 1

## 2021-06-04 MED ORDER — VANCOMYCIN HCL 1250 MG/250ML IV SOLN
1250.0000 mg | Freq: Once | INTRAVENOUS | Status: AC
  Administered 2021-06-04: 1250 mg via INTRAVENOUS
  Filled 2021-06-04: qty 250

## 2021-06-04 MED ORDER — TAMSULOSIN HCL 0.4 MG PO CAPS: 0.4000 mg | ORAL_CAPSULE | Freq: Every day | ORAL | Status: DC

## 2021-06-04 MED ORDER — DAROLUTAMIDE 300 MG PO TABS: 600.0000 mg | ORAL_TABLET | Freq: Two times a day (BID) | ORAL | Status: DC

## 2021-06-04 MED ORDER — PANTOPRAZOLE SODIUM 40 MG PO TBEC: 40.0000 mg | DELAYED_RELEASE_TABLET | Freq: Every day | ORAL | Status: DC

## 2021-06-04 MED ORDER — IPRATROPIUM-ALBUTEROL 20-100 MCG/ACT IN AERS
1.0000 | INHALATION_SPRAY | Freq: Four times a day (QID) | RESPIRATORY_TRACT | Status: DC
  Filled 2021-06-04: qty 4

## 2021-06-04 MED ORDER — APIXABAN 5 MG PO TABS: 5.0000 mg | ORAL_TABLET | Freq: Two times a day (BID) | ORAL | Status: DC

## 2021-06-04 MED ORDER — FERROUS SULFATE 325 (65 FE) MG PO TABS: 325.0000 mg | ORAL_TABLET | Freq: Every day | ORAL | Status: DC

## 2021-06-04 MED ORDER — ZINC SULFATE 220 (50 ZN) MG PO CAPS: 220.0000 mg | ORAL_CAPSULE | Freq: Every day | ORAL | Status: DC

## 2021-06-04 MED ORDER — AZITHROMYCIN 500 MG IV SOLR
500.0000 mg | INTRAVENOUS | Status: DC
  Administered 2021-06-04 – 2021-06-05 (×2): 500 mg via INTRAVENOUS
  Filled 2021-06-04 (×3): qty 500

## 2021-06-04 NOTE — Progress Notes (Signed)
Updated pt's son Vincent Streater. Via telephone.  Discussed treatment for COVID-19, questionable superimposed bacterial PNA, and UTI.  Pt's respiratory status has improved, currently weaned down to 4L [place].  Son confirms DNR/DNI status.  Also consulting Palliative Care as the patient is followed by Palliative in the outpatient setting.  All questions answered to the son's satisfaction.     Darel Hong, AGACNP-BC [facility] Prefer epic messenger for cross cover needs If after hours, please call E-link

## 2021-06-04 NOTE — Progress Notes (Signed)
Pt not able to do Flutter or IS at this time.

## 2021-06-04 NOTE — Progress Notes (Signed)
NAME:  Carl [name], MRN:  [id number], DOB:  Jan 03, 1936, LOS: 1 ADMISSION DATE:  06/21/2021, CONSULTATION DATE:  06/08/2021 REFERRING MD:  Loraine Leriche MD CHIEF COMPLAINT: SOB   HPI  85 y.o with significant multiple PMH as below who presented to the ED via [facility] from [facility] with progressive SOB.   Pt D/X with Covid on 11/4 and now on 4L O2, previously on RA. EMS states O2 sats down to 84 on the 4L and lethargy at facility.  ED Course: On arrival to the ED, he was afebrile with blood pressure 103/64 mm Hg and pulse rate 111 beats/min, RR 20 breath per minute, sats 96% 6L.  Patient continued to use to desat on 6 L of oxygen via nasal cannula therefore placed on high flow nasal cannula as well as nonrebreather.  He was also noted with intermittent low blood pressure and was given 500 cc IV fluid bolus x2 with some improvement.  Patient was started on home Solu-Medrol, and remdesivir for his known COVID.  Labs and imaging obtained as follows: Pertinent Labs in Red/Diagnostics Findings: Na+/ K+: 134/3.8 Glucose: 123 BUN/Cr.: 32/0.85 Calcium: 7.8 AST/ALT: 21/12   WBC/ TMAX: 11.1/ afebrile Hgb/Hct: 11.0/33.7 Plts: 199 PCT: 1.15 Lactic acid: 1.9 CRP: 26.9 COVID PCR: positive   Troponin: 19 BNP: 147.6  EKG: Atrial fibrillation rate of 102, no ST elevation, no T wave versions, normal intervals Imaging : CXR> Interval right greater than left interstitial and alveolar disease, favored to represent pneumonia  PCCM consulted for admission and further management.  Past Medical History    HFpEF) heart failure with preserved ejection fraction ([place]   a. 08/2016 Echo: EF 50-55%.  AAA (abdominal aortic aneurysm) ([place])    CT abd. dated 06-18-2018 in epic,  3.1cm;  followed by pcp  Arthritis    arms  Bladder calculus    Chronic anemia    Chronic anxiety    CKD (chronic kidney disease), stage III ([place])    Coronary artery disease CARDIOLOGIST-  DR GOLLAN  a. CABG 2006 with   LIMA-LAD, SVG-Ramus, SVG-OM, seq SVG-PLV-PDA.  DOE (dyspnea on exertion)    GERD (gastroesophageal reflux disease)    Hematuria    History of bladder stone    History of non-ST elevation myocardial infarction (NSTEMI) 03/30/2005  post op recovery inguinal hernia repair  History of recurrent UTIs    last admission 08-04-2018 pseudomonas UTI  Hyperlipidemia    Hyperplasia of prostate without lower urinary tract symptoms (LUTS)    Hypertension    Ischemic cardiomyopathy    a. echo (01/13) 45-50% septal akinesis, mild MR; b. Echo  (01/16) ef 60-65%, mid-apical anteroseptal hypokinesis; c. echo 09-16-2016 ef 50-55%  Left ureteral stone    Lung nodule    noted 08/ 2018;  followed by pcp  Moderate mitral regurgitation    a.08/2016 Echo: Mod MR/TR.  Orthostatic hypotension    PAC (premature atrial contraction)    Paroxysmal atrial flutter ([place])    a. dx 08/2016, started on amiodarone.  Persistent atrial fibrillation followed by cardiologist-- dr Rockey Situ  a. dx AFib 08/2016 s/p DCCV 10/2016-->eliquis (CHA2DS2VASc = 5).  Pulmonary hypertension ([place])    last echo in epic 09-16-2016  Recurrent prostate cancer ([place]) UROLOGIST-- DR BELL  s/p  radioactive prostate seed implants in 1997;    due to PSA rising pt has been doing lupron injection's few past several yrs at dr bell office  Renal calculi    bilateral per CT 06-18-2018  S/P CABG x 5 04/04/2005  LIMA to LAD,  SVG to Ramus,  SVG to OM,  seqSVG to PLV and PDA  Wears glasses    Significant Hospital Events   06/02/2021: Admitted to ICU with acute hypoxic respiratory failure secondary to COVID-pneumonia 06/04/21: Respiratory status improved, FiO2 weaned to 4L [place]. UA is + for UTI.  Palliative Care consulted to assist with [facility] (follows with Palliative as outpatient)  Consults:  PCCM Palliative Care  Procedures:  None  Significant Diagnostic Tests:  11/7: Chest Xray> Interval right greater than left interstitial and alveolar disease, favored  to represent pneumonia  Micro Data:  11/7: SARS-CoV-2 PCR> POSITIVE 11/7: Influenza PCR> negative 11/7: Blood culture x2> 11/7: Urine Culture> 11/7: MRSA PCR>>  11/7: Strep pneumo urinary antigen> 11/7: Legionella urinary antigen>  Antimicrobials:  Azithromycin 11/8> Ceftriaxone 11/8> Remdesivir 11/8>>  OBJECTIVE  Blood pressure 102/64, pulse 82, temperature (!) 96 F (35.6 C), temperature source Axillary, resp. rate (!) 22, height 5\' 9"  (1.753 m), weight 52.2 kg, SpO2 99 %.        Intake/Output Summary (Last 24 hours) at 06/04/2021 0831 Last data filed at 06/04/2021 0311 Gross per 24 hour  Intake 1350 ml  Output --  Net 1350 ml    Filed Weights   06/14/2021 1851 06/04/21 0311  Weight: 59.3 kg 52.2 kg   Physical Examination  GENERAL: 85 year-old pale cachetic appearing, critically ill patient lying in the bed, on [place] 4L,  with no acute distress.  EYES: Pupils equal, round, reactive to light and accommodation. No scleral icterus. Extraocular muscles intact.  HEENT: Head atraumatic, normocephalic. Oropharynx and nasopharynx clear.  NECK:  Supple, no jugular venous distention. No thyroid enlargement, no tenderness.  LUNGS: Diminished breath sounds bilaterally, no wheezing, rales,rhonchi or crepitation. No use of accessory muscles of respiration.  CARDIOVASCULAR: Irregularly irregular rhythm, rate controlled. No murmurs, rubs, or gallops  ABDOMEN: Soft, nontender, nondistended. Bowel sounds present. No organomegaly or mass.  EXTREMITIES: No pedal edema, cyanosis, or clubbing.  NEUROLOGIC: Lethargic, arouses easily to voice, follows commands, Oriented to person and place. SKIN: No obvious rash, lesion, Sacral pressure ulcer, healing wound on left knee  Labs/imaging that I havepersonally reviewed  (right click and "Reselect all SmartList Selections" daily)     Labs   CBC: Recent Labs  Lab 06/08/2021 1850  WBC 11.1*  NEUTROABS 10.3*  HGB 11.0*  HCT 33.7*  MCV 92.3  PLT  199     Basic Metabolic Panel: Recent Labs  Lab 06/08/2021 1850 06/04/21 0456  NA 134*  --   K 3.8  --   CL 105  --   CO2 22  --   GLUCOSE 123*  --   BUN 32*  --   CREATININE 0.85  --   CALCIUM 7.8*  --   MG  --  2.0  PHOS  --  3.4    GFR: Estimated Creatinine Clearance: 48.6 mL/min (by C-G formula based on SCr of 0.85 mg/dL). Recent Labs  Lab 06/14/2021 1850 05/29/2021 2119  PROCALCITON 1.15  --   WBC 11.1*  --   LATICACIDVEN 1.9 1.6     Liver Function Tests: Recent Labs  Lab 06/26/2021 1850  AST 21  ALT 12  ALKPHOS 58  BILITOT 0.8  PROT 6.0*  ALBUMIN 2.3*    No results for input(s): LIPASE, AMYLASE in the last 168 hours. No results for input(s): AMMONIA in the last 168 hours.  ABG    Component Value Date/Time  TCO2 23 05/07/2016 0912      Coagulation Profile: No results for input(s): INR, PROTIME in the last 168 hours.  Cardiac Enzymes: No results for input(s): CKTOTAL, CKMB, CKMBINDEX, TROPONINI in the last 168 hours.  HbA1C: Hgb A1c MFr Bld  Date/Time Value Ref Range Status  06/18/2018 04:11 PM 5.2 <5.7 % of total Hgb Final    Comment:    For the purpose of screening for the presence of diabetes: . <5.7%       Consistent with the absence of diabetes 5.7-6.4%    Consistent with increased risk for diabetes             (prediabetes) > or =6.5%  Consistent with diabetes . This assay result is consistent with a decreased risk of diabetes. . Currently, no consensus exists regarding use of hemoglobin A1c for diagnosis of diabetes in children. . According to American Diabetes Association (ADA) guidelines, hemoglobin A1c <7.0% represents optimal control in non-pregnant diabetic patients. Different metrics may apply to specific patient populations.  Standards of Medical Care in Diabetes(ADA). [name]   03/12/2017 04:37 PM 5.5 4.8 - 5.6 % Final    Comment:    (NOTE) Pre diabetes:          5.7%-6.4% Diabetes:              >6.4% Glycemic control  for   <7.0% adults with diabetes     CBG: No results for input(s): GLUCAP in the last 168 hours.  Review of Systems:   UNABLE TO OBTAIN DUE TO AMS/POOR HISTORIAN  Past Medical History  He,  has a past medical history of (HFpEF) heart failure with preserved ejection fraction ([place]), AAA (abdominal aortic aneurysm) (06/05/2019), Arthritis, Bladder calculus, Chronic anemia, Chronic anxiety, CKD (chronic kidney disease), stage III ([place]), Coronary artery disease (CARDIOLOGIST-  DR Rockey Situ), DOE (dyspnea on exertion), Frequent falls (11/2019), GERD (gastroesophageal reflux disease), Heart murmur, Hematuria, Hip fracture ([place]) (10/05/2018), History of bladder stone, History of colon polyps, History of non-ST elevation myocardial infarction (NSTEMI) (03/30/2005), History of pressure ulcer, History of recurrent UTIs, Hyperlipidemia, Hyperplasia of prostate without lower urinary tract symptoms (LUTS), Hypertension, Indwelling Foley catheter present, Intracranial hematoma (10/05/2018), Ischemic cardiomyopathy, Left ureteral stone, Lung nodule, LVH (left ventricular hypertrophy) (09/16/2016), Moderate mitral regurgitation, Orthostatic hypotension, PAC (premature atrial contraction), Paroxysmal atrial flutter ([place]), Persistent atrial fibrillation ([place]) (followed by cardiologist-- dr Rockey Situ), Pneumonia, Pulmonary hypertension ([place]), Recurrent prostate cancer ([place]) (UROLOGIST-- DR BELL), Renal calculi, S/P CABG x 5 (04/04/2005), and Wears glasses.   Surgical History    Past Surgical History:  Procedure Laterality Date   CARDIAC CATHETERIZATION  04/02/2005   dr Doreatha Lew    Critical three-vessel coronary disease --  Essentially normal left ventricular function , ef 55%   CARDIOVASCULAR STRESS TEST  05-01-2008  dr Doreatha Lew /  dr harding   Low risk nuclear study w/ no ischemia or scar but flattened septal motion/  ef 64%   CARDIOVERSION N/A 11/19/2016   Procedure: CARDIOVERSION;  Surgeon: Jerline Pain, MD;  Location:  [facility] ENDOSCOPY;  Service: Cardiovascular;  Laterality: N/A;   COLONOSCOPY N/A 10/23/2016   Procedure: COLONOSCOPY;  Surgeon: Milus Banister, MD;  Location: [facility] ENDOSCOPY;  Service: Endoscopy;  Laterality: N/A;   CORONARY ARTERY BYPASS GRAFT  04/04/2005   dr  Ricard Dillon   LIMA - LAD,  SVG - Ramus, SVG - OM,  seqSVG - PLV and PDA   CYSTOSCOPY WITH LITHOLAPAXY N/A 05/07/2016   Procedure: CYSTOSCOPY WITH LITHOLAPAXY;  Surgeon:  Rana Snare, MD;  Location[facility];  Service: Urology;  Laterality: N/A;   CYSTOSCOPY WITH RETROGRADE PYELOGRAM, URETEROSCOPY AND STENT PLACEMENT  09/03/2018   Procedure: CYSTOSCOPY WITH RETROGRADE PYELOGRAM, URETEROSCOPY LASER LITHOTRIPSY AND STENT PLACEMENT;  Surgeon: Lucas Mallow, MD;  Location: [facility] ORS;  Service: Urology;;   CYSTOSCOPY/URETEROSCOPY/HOLMIUM LASER/STENT PLACEMENT Right 06/15/2019   Procedure: CYSTOSCOPY RIGHT URETEROSCOPY HOLMIUM LASER STENT PLACEMENT;  Surgeon: Lucas Mallow, MD;  Location: [facility] ORS;  Service: Urology;  Laterality: Right;   HIP ARTHROPLASTY Left 09/03/2020   Procedure: ARTHROPLASTY BIPOLAR HIP (HEMIARTHROPLASTY);  Surgeon: Earnestine Leys, MD;  Location: [facility] ORS;  Service: Orthopedics;  Laterality: Left;   HOLMIUM LASER APPLICATION N/A [id number]   Procedure: HOLMIUM LASER APPLICATION;  Surgeon: Rana Snare, MD;  Location: [facility];  Service: Urology;  Laterality: N/A;   INGUINAL HERNIA REPAIR Left 03-30-2005  dr Excell Seltzer   incarcerated    [place] Right 10/07/2018   Procedure: TOTAL HIP ARTHROPLASTY ANTERIOR APPROACH;  Surgeon: Rod Can, MD;  Location: [facility] ORS;  Service: Orthopedics;  Laterality: Right;   TRANSTHORACIC ECHOCARDIOGRAM  08/24/2014   mid anteroseptal and distal septak hypokinetic,  mild focal basal LVH, ef 60-65%,  grade 1 diastolic dysfunction/  mild MR/  mild LAE/  mild dilated RV with normal RVSP/  trivial TR     Social  History   reports that he quit smoking about 60 years ago. His smoking use included cigarettes. He has never used smokeless tobacco. He reports that he does not drink alcohol and does not use drugs.   Family History   His family history includes Cancer in his brother, father, and mother; Heart disease in his mother.   Allergies Allergies  Allergen Reactions   Ciprofloxacin Other (See Comments)    PER HEART DR   Ibuprofen Other (See Comments)    Kidney issues     Home Medications  Prior to Admission medications   Medication Sig Start Date End Date Taking? Authorizing Provider  acetaminophen (TYLENOL) 650 MG CR tablet Take 650 mg by mouth every 8 (eight) hours.   Yes [provider]  apixaban (ELIQUIS) 5 MG TABS tablet Take 1 tablet (5 mg total) by mouth 2 (two) times daily. 05/03/20  Yes [name], NP  darolutamide (NUBEQA) 300 MG tablet Take 2 tablets (600 mg total) by mouth 2 (two) times daily. 09/07/20  Yes Fritzi Mandes, MD  dexamethasone (DECADRON) 6 MG tablet Take 6 mg by mouth 2 (two) times daily with a meal.   Yes [provider]  ferrous sulfate 325 (65 FE) MG tablet Take 1 tablet (325 mg total) by mouth daily with breakfast. 09/07/20  Yes Fritzi Mandes, MD  guaiFENesin (MUCINEX) 600 MG 12 hr tablet Take 600 mg by mouth 2 (two) times daily.   Yes [provider]  mirtazapine (REMERON SOL-TAB) 15 MG disintegrating tablet Take 15 mg by mouth at bedtime.   Yes [provider]  Multiple Vitamin (MULTIVITAMIN WITH MINERALS) TABS tablet Take 1 tablet by mouth daily. 09/07/20  Yes Fritzi Mandes, MD  omeprazole (PRILOSEC) 40 MG capsule TAKE 1 CAPSULE BY MOUTH EVERY DAY 09/18/20  Yes [name], MD  senna (SENOKOT) 8.6 MG TABS tablet Take 1 tablet by mouth in the morning and at bedtime.   Yes [provider]  tamsulosin (FLOMAX) 0.4 MG CAPS capsule Take 1 capsule (0.4 mg total) by mouth  daily after supper. 01/06/18  Yes [name], MD   traMADol (ULTRAM) 50 MG tablet Take 25 mg by mouth every 6 (six) hours.   Yes [provider]  acetaminophen (TYLENOL) 500 MG tablet Take 500 mg by mouth every 8 (eight) hours as needed for moderate pain.    [provider]  diclofenac Sodium (VOLTAREN) 1 % GEL Apply 1 application topically 3 (three) times daily as needed. For pain 10/31/20   [provider]  Scheduled Meds:  apixaban  5 mg Oral BID   vitamin C  500 mg Oral Daily   Chlorhexidine Gluconate Cloth  6 each Topical Daily   darolutamide  600 mg Oral BID   dexamethasone (DECADRON) injection  6 mg Intravenous Q24H   ferrous sulfate  325 mg Oral Q breakfast   Ipratropium-Albuterol  1 puff Inhalation Q6H   mouth rinse  15 mL Mouth Rinse BID   mirtazapine  15 mg Oral QHS   multivitamin with minerals  1 tablet Oral Daily   pantoprazole  40 mg Oral Daily   senna  1 tablet Oral QHS   tamsulosin  0.4 mg Oral QPC supper   traMADol  25 mg Oral Q6H   zinc sulfate  220 mg Oral Daily   Continuous Infusions:  azithromycin 500 mg (06/04/21 0311)   cefTRIAXone (ROCEPHIN)  IV Stopped (06/04/21 0250)   remdesivir 100 mg in NS 100 mL     PRN Meds:.chlorpheniramine-HYDROcodone, docusate sodium, guaiFENesin-dextromethorphan, polyethylene glycol  Assessment & Plan:   Acute Hypoxic Respiratory Failure secondary to COVID-19 Pneumonia +/- ? Superimposed Bacterial Pneumonia Tested COVID + on 11/4 at the facility -Supplemental O2 as needed to maintain O2 saturations > 88 % -Follow intermittent ABG and chest x-ray as needed -Ensure adequate pulmonary hygiene  -Maintain Euvovolemia to net negative fluid balance as able -F/u cultures, trend PCT, wbc, monitor fever curve -As needed bronchodilators -Continue remdesivir x5 days  -Continue steroids. -Continue Empiric abx coverage given risk factors with Ceftriaxone + Azithromycin pending cultures, may broaden if appropriate -Encourage OOB, IS, FV, and awake proning if  able -Vitamin C & Zinc -Continue airborne, contact precautions for 21 days from positive testing. -Monitor CMP and inflammatory markers  Sepsis due to COVID-19 Pneumonia +/- ? Superimposed Bacterial Pneumonia UTI -Monitor fever curve -Trend WBC's & Procalcitonin -Follow cultures as above -Continue empiric Azithromycin & Rocephin pending cultures & sensitivities -Treatment for COVID as listed above  Hypertension Persistent Atrial Fibrillation Hyperlipidemia PMH x: Coronary Artery Disease status post 5 vessel bypass in 2006, ischemic cardiomyopathy with recovery of LV function -Continuous cardiac monitoring -Maintain MAP greater than 65 -Continue home eliquis 5mg  BID   Acute Metabolic Encephalopathy in setting of Sepsis and COVID-19 -Treat sepsis and infectious processes -Provide supportive care, promote normal sleep/wake cycle -Avoid sedating meds as able  Physical deconditioning Recent Hx of left hip fracture status post left knee arthroplasty -Has been taking his Eliquis for anticoagulation  -PT/OT consult, mobilize as able -Palliative following outpatient ~ will consult here in Hospital  Chronic low back pain -Continue home tramadol as needed   Prostate cancer -Continue home darolutamide  Best practice:  Diet:  Oral Pain/Anxiety/Delirium protocol (if indicated): No VAP protocol (if indicated): Not indicated DVT prophylaxis: Eliquis GI prophylaxis: PPI Glucose control:  SSI No Central venous access:  N/A Arterial line:  N/A Foley:  N/A Mobility:  OOB as tolerated PT consulted: Yes Last date of multidisciplinary goals of care discussion [11/8] Code Status:  DNR Disposition:  ICU   = Goals of Care = Code Status Order: DNR  Primary Emergency Contact: Vahey,[name] Patient wishes to pursue ongoing treatment, but concurred that if deteriorated to pulselessness, patient would prefer a natural death as opposed to invasive measures such as CPR and intubation.   Family would wish to be immediately contacted in such situations.  Critical care time: 40 minutes     Darel Hong, AGACNP-BC [facility] Prefer epic messenger for cross cover needs If after hours, please call E-link   .

## 2021-06-04 NOTE — Progress Notes (Signed)
PHARMACY - PHYSICIAN COMMUNICATION CRITICAL VALUE ALERT - BLOOD CULTURE IDENTIFICATION (BCID)  Carl [name] is an 85 y.o. male who presented to [facility] on 06/21/2021 with a chief complaint of shortness of breath  Assessment:  11/7 blood cx with GPC in both bottles on 1 set.  BCID = staphylococcus species (NOT S aureus or S epidermidis).  He is COVID + on antibiotics for possible superimposed pneumonia  Name of physician (or Provider) Contacted: DR Patsey Berthold  Current antibiotics: ceftriaxone, azithromycin   Changes to prescribed antibiotics recommended:  - Give vancomycin x 1 dose pending update to blood cultures.  Suspect contaminant  Results for orders placed or performed during the hospital encounter of 06/16/2021  Blood Culture ID Panel (Reflexed) (Collected: 06/24/2021  6:50 PM)  Result Value Ref Range   Enterococcus faecalis NOT DETECTED NOT DETECTED   Enterococcus Faecium NOT DETECTED NOT DETECTED   Listeria monocytogenes NOT DETECTED NOT DETECTED   Staphylococcus species DETECTED (A) NOT DETECTED   Staphylococcus aureus (BCID) NOT DETECTED NOT DETECTED   Staphylococcus epidermidis NOT DETECTED NOT DETECTED   Staphylococcus lugdunensis NOT DETECTED NOT DETECTED   Streptococcus species NOT DETECTED NOT DETECTED   Streptococcus agalactiae NOT DETECTED NOT DETECTED   Streptococcus pneumoniae NOT DETECTED NOT DETECTED   Streptococcus pyogenes NOT DETECTED NOT DETECTED   A.calcoaceticus-baumannii NOT DETECTED NOT DETECTED   Bacteroides fragilis NOT DETECTED NOT DETECTED   Enterobacterales NOT DETECTED NOT DETECTED   Enterobacter cloacae complex NOT DETECTED NOT DETECTED   Escherichia coli NOT DETECTED NOT DETECTED   Klebsiella aerogenes NOT DETECTED NOT DETECTED   Klebsiella oxytoca NOT DETECTED NOT DETECTED   Klebsiella pneumoniae NOT DETECTED NOT DETECTED   Proteus species NOT DETECTED NOT DETECTED   Salmonella species NOT DETECTED NOT DETECTED   Serratia marcescens NOT  DETECTED NOT DETECTED   Haemophilus influenzae NOT DETECTED NOT DETECTED   Neisseria meningitidis NOT DETECTED NOT DETECTED   Pseudomonas aeruginosa NOT DETECTED NOT DETECTED   Stenotrophomonas maltophilia NOT DETECTED NOT DETECTED   Candida albicans NOT DETECTED NOT DETECTED   Candida auris NOT DETECTED NOT DETECTED   Candida glabrata NOT DETECTED NOT DETECTED   Candida krusei NOT DETECTED NOT DETECTED   Candida parapsilosis NOT DETECTED NOT DETECTED   Candida tropicalis NOT DETECTED NOT DETECTED   Cryptococcus neoformans/gattii NOT DETECTED NOT DETECTED    Doreene Eland, PharmD, BCPS.   Work Cell: [phone] 06/04/2021 2:03 PM

## 2021-06-05 DIAGNOSIS — T17908A Unspecified foreign body in respiratory tract, part unspecified causing other injury, initial encounter: Secondary | ICD-10-CM | POA: Diagnosis present

## 2021-06-05 DIAGNOSIS — Z7189 Other specified counseling: Secondary | ICD-10-CM | POA: Diagnosis not present

## 2021-06-05 DIAGNOSIS — E8809 Other disorders of plasma-protein metabolism, not elsewhere classified: Secondary | ICD-10-CM | POA: Diagnosis present

## 2021-06-05 DIAGNOSIS — G9341 Metabolic encephalopathy: Secondary | ICD-10-CM | POA: Diagnosis present

## 2021-06-05 DIAGNOSIS — J1282 Pneumonia due to coronavirus disease 2019: Secondary | ICD-10-CM | POA: Diagnosis not present

## 2021-06-05 DIAGNOSIS — J9600 Acute respiratory failure, unspecified whether with hypoxia or hypercapnia: Secondary | ICD-10-CM | POA: Diagnosis not present

## 2021-06-05 DIAGNOSIS — U071 COVID-19: Secondary | ICD-10-CM | POA: Diagnosis not present

## 2021-06-05 LAB — CBC WITH DIFFERENTIAL/PLATELET
Abs Immature Granulocytes: 0.06 10*3/uL (ref 0.00–0.07)
Basophils Absolute: 0 10*3/uL (ref 0.0–0.1)
Basophils Relative: 0 %
Eosinophils Absolute: 0 10*3/uL (ref 0.0–0.5)
Eosinophils Relative: 0 %
HCT: 32.5 % — ABNORMAL LOW (ref 39.0–52.0)
Hemoglobin: 10 g/dL — ABNORMAL LOW (ref 13.0–17.0)
Immature Granulocytes: 1 %
Lymphocytes Relative: 5 %
Lymphs Abs: 0.4 10*3/uL — ABNORMAL LOW (ref 0.7–4.0)
MCH: 29.3 pg (ref 26.0–34.0)
MCHC: 30.8 g/dL (ref 30.0–36.0)
MCV: 95.3 fL (ref 80.0–100.0)
Monocytes Absolute: 0.2 10*3/uL (ref 0.1–1.0)
Monocytes Relative: 3 %
Neutro Abs: 6.9 10*3/uL (ref 1.7–7.7)
Neutrophils Relative %: 91 %
Platelets: 201 10*3/uL (ref 150–400)
RBC: 3.41 MIL/uL — ABNORMAL LOW (ref 4.22–5.81)
RDW: 16.5 % — ABNORMAL HIGH (ref 11.5–15.5)
WBC: 7.6 10*3/uL (ref 4.0–10.5)
nRBC: 0 % (ref 0.0–0.2)

## 2021-06-05 LAB — COMPREHENSIVE METABOLIC PANEL
ALT: 9 U/L (ref 0–44)
AST: 14 U/L — ABNORMAL LOW (ref 15–41)
Albumin: 1.9 g/dL — ABNORMAL LOW (ref 3.5–5.0)
Alkaline Phosphatase: 50 U/L (ref 38–126)
Anion gap: 12 (ref 5–15)
BUN: 35 mg/dL — ABNORMAL HIGH (ref 8–23)
CO2: 18 mmol/L — ABNORMAL LOW (ref 22–32)
Calcium: 8.8 mg/dL — ABNORMAL LOW (ref 8.9–10.3)
Chloride: 112 mmol/L — ABNORMAL HIGH (ref 98–111)
Creatinine, Ser: 0.67 mg/dL (ref 0.61–1.24)
GFR, Estimated: >60 mL/min (ref >60)
Glucose, Bld: 99 mg/dL (ref 70–99)
Potassium: 3.9 mmol/L (ref 3.5–5.1)
Sodium: 142 mmol/L (ref 135–145)
Total Bilirubin: 1 mg/dL (ref 0.3–1.2)
Total Protein: 5.4 g/dL — ABNORMAL LOW (ref 6.5–8.1)

## 2021-06-05 LAB — D-DIMER, QUANTITATIVE: D-Dimer, Quant: 1.07 ug/mL-FEU — ABNORMAL HIGH (ref 0.00–0.50)

## 2021-06-05 LAB — LEGIONELLA PNEUMOPHILA SEROGP 1 UR AG: L. pneumophila Serogp 1 Ur Ag: NEGATIVE

## 2021-06-05 LAB — C-REACTIVE PROTEIN: CRP: 20.6 mg/dL — ABNORMAL HIGH (ref <1.0)

## 2021-06-05 LAB — STREP PNEUMONIAE URINARY ANTIGEN: Strep Pneumo Urinary Antigen: NEGATIVE

## 2021-06-05 LAB — MAGNESIUM: Magnesium: 2 mg/dL (ref 1.7–2.4)

## 2021-06-05 LAB — PROCALCITONIN: Procalcitonin: 0.7 ng/mL

## 2021-06-05 LAB — PHOSPHORUS: Phosphorus: 3.1 mg/dL (ref 2.5–4.6)

## 2021-06-05 LAB — FERRITIN: Ferritin: 268 ng/mL (ref 24–336)

## 2021-06-05 MED ORDER — DILTIAZEM HCL 25 MG/5ML IV SOLN
10.0000 mg | Freq: Once | INTRAVENOUS | Status: AC
  Administered 2021-06-05: 10 mg via INTRAVENOUS
  Filled 2021-06-05: qty 5

## 2021-06-05 MED ORDER — PIPERACILLIN-TAZOBACTAM 3.375 G IVPB
3.3750 g | Freq: Three times a day (TID) | INTRAVENOUS | Status: DC
  Administered 2021-06-05: 3.375 g via INTRAVENOUS
  Filled 2021-06-05: qty 50

## 2021-06-05 MED ORDER — APIXABAN 2.5 MG PO TABS: 2.5000 mg | ORAL_TABLET | Freq: Two times a day (BID) | ORAL | Status: DC

## 2021-06-05 MED ORDER — MORPHINE SULFATE (PF) 2 MG/ML IV SOLN
1.0000 mg | INTRAVENOUS | Status: DC | PRN
  Administered 2021-06-05: 1 mg via INTRAVENOUS
  Filled 2021-06-05: qty 1

## 2021-06-05 MED ORDER — ENOXAPARIN SODIUM 40 MG/0.4ML IJ SOSY: 40.0000 mg | PREFILLED_SYRINGE | INTRAMUSCULAR | Status: DC

## 2021-06-05 MED ORDER — MORPHINE SULFATE (PF) 2 MG/ML IV SOLN
2.0000 mg | INTRAVENOUS | Status: DC | PRN
  Administered 2021-06-05 (×2): 2 mg via INTRAVENOUS
  Filled 2021-06-05 (×2): qty 1

## 2021-06-05 MED ORDER — DILTIAZEM HCL 25 MG/5ML IV SOLN: 10.0000 mg | INTRAVENOUS | Status: DC | PRN

## 2021-06-05 MED ORDER — GLYCOPYRROLATE 0.2 MG/ML IJ SOLN
0.1000 mg | Freq: Once | INTRAMUSCULAR | Status: AC
  Administered 2021-06-05: 0.1 mg via INTRAVENOUS
  Filled 2021-06-05: qty 1

## 2021-06-05 NOTE — Progress Notes (Addendum)
PROGRESS NOTE    Carl [name]  HMC:[id number] DOB: 1936/02/16 DOA: 06/15/2021 PCP: Alvester Morin, MD  [id number]   Assessment & Plan:   Principal Problem:   Acute respiratory failure due to COVID-19 [facility]) Active Problems:   Hx of CABG x 5 '06   Chronic kidney disease (CKD), stage III (moderate) (HCC)   Prostate CA (HCC)   Longstanding persistent atrial fibrillation (HCC)   Generalized weakness   Pneumonia   Pseudomonas urinary tract infection   Protein-calorie malnutrition, severe   Iron deficiency anemia   Sepsis ([place])   Dysphagia   Pressure ulcer   Aspiration into airway   Hypoalbuminemia   Acute metabolic encephalopathy   85 y.o with hx of A. fib, CAD status post CABG x5 in 2006, hypertension, iron deficiency anemia, orthostatic hypotension, prostate cancer, HFpEF, dysphagia who presented to the ED from [place] with progressive SOB.   Pt D/X with Covid on 11/4.  EMS states O2 sats down to 84 on the 4L and lethargy at facility.   Acute Hypoxic Respiratory Failure secondary to  COVID-19 Pneumonia  Superimposed Bacterial Pneumonia --started on tx for both covid and bacterial PNA on admission. --O2 requirement worsened today, from 5L up to 14L and then non-rebreather. Plan: --Continue supplemental O2 to keep sats >=90% --cont current tx, per son's wish.  COVID-19 Pneumonia, POA Tested COVID + on 11/4 at the facility. --CRP 26.9 on presentation. Plan: --cont IV Remdesivir --cont IV decadron  Superimposed Bacterial Pneumonia --procal elevated at 1.15 on presentation. --started on ceftriaxone and azithromycin Plan: --cont azithro --switch to zosyn due to finding of pseudomonal UTI   UTI 2/2 pseudomonas  --given the severity of pt's illness, will treat --switch abx from ceftriaxone to zosyn today  Sepsis, POA --criteria met with tachycardia, leukocytosis, source PNA and/or UTI   Persistent Atrial Fibrillation with  RVR --IV dilt 10 mg x1 --will start dilt gtt if HR remained >110's after IV dilt 10 mg --unable to take home Eliquis due to NPO status   Acute Metabolic Encephalopathy in setting of Sepsis and COVID-19 --keep NPO for now due to lethargy and risk of aspiration  Dysphagia --Pt has underlying dysphagia with known silent aspiration. --unable to clear his own secretion currently --Keep NPO for now due to significant aspiration risk  Chronic low back pain --d/c scheduled tramadol   Prostate cancer -unable to take home darolutamide due to NPO status.  Underweight, Severe malnutrition in context of chronic illness --no tube feed, per son --currently NPO due to high aspiration risks --palliative consult today   DVT prophylaxis: Lovenox SQ while unable to take oral Eliquis Code Status: DNR  Family Communication: son updated on the phone today.  Pt currently is appropriate for comfort care and hospice, however, son would like to continue treatment and will come see the pt soon and possibly make a decision on goals of care.     Level of care: Stepdown Dispo:   The patient is from: SNF Anticipated d/c is to: undetermined Anticipated d/c date is: undetermined Patient currently is not medically ready to d/c due to: on non-rebreather.   Subjective and Interval History:  Pt was not able to take pills due to lethargy and aspiration risks.  Pt woke up to tell me "I am dying."  Denied being in pain.  O2 requirement steadily increasing, from 5L up to 14L.  RR up to 40's.  Also went into Afib w RVR.  Palliative care provider spoke with son  this morning, I spoke with son this afternoon.    Son would like to continue treatment, but will be by to see pt shortly.   Objective: Vitals:   06/05/21 1000 06/05/21 1500 06/05/21 1600 06/05/21 1648  BP:  118/74 118/82 122/78  Pulse:  (!) 118 (!) 127 (!) 109  Resp:  (!) 42 (!) 43 (!) 44  Temp: (!) 97.1 F (36.2 C) (!) 97.4 F (36.3 C) (!) 97.1 F  (36.2 C) (!) 97 F (36.1 C)  TempSrc: Axillary Axillary Axillary Axillary  SpO2: 95% 91% (!) 87% 93%  Weight:      Height:        Intake/Output Summary (Last 24 hours) at 06/05/2021 1726 Last data filed at 06/05/2021 1620 Gross per 24 hour  Intake 1061.71 ml  Output 1000 ml  Net 61.71 ml   Filed Weights   06/01/2021 1851 06/04/21 0311 06/05/21 0211  Weight: 59.3 kg 52.2 kg 55.6 kg    Examination:   Constitutional: NAD, lethargic HEENT: conjunctivae and lids normal, EOMI CV: No cyanosis.   RESP: increased RR, loud gurgling sounds with breathing Extremities: trace pitting edema in BLE SKIN: warm, dry   Data Reviewed: I have personally reviewed following labs and imaging studies  CBC: Recent Labs  Lab 06/15/2021 1850 06/05/21 0539  WBC 11.1* 7.6  NEUTROABS 10.3* 6.9  HGB 11.0* 10.0*  HCT 33.7* 32.5*  MCV 92.3 95.3  PLT 199 659   Basic Metabolic Panel: Recent Labs  Lab 06/18/2021 1850 06/04/21 0456 06/05/21 0539  NA 134*  --  142  K 3.8  --  3.9  CL 105  --  112*  CO2 22  --  18*  GLUCOSE 123*  --  99  BUN 32*  --  35*  CREATININE 0.85  --  0.67  CALCIUM 7.8*  --  8.8*  MG  --  2.0 2.0  PHOS  --  3.4 3.1   GFR: Estimated Creatinine Clearance: 55 mL/min (by C-G formula based on SCr of 0.67 mg/dL). Liver Function Tests: Recent Labs  Lab 06/04/2021 1850 06/05/21 0539  AST 21 14*  ALT 12 9  ALKPHOS 58 50  BILITOT 0.8 1.0  PROT 6.0* 5.4*  ALBUMIN 2.3* 1.9*   No results for input(s): LIPASE, AMYLASE in the last 168 hours. No results for input(s): AMMONIA in the last 168 hours. Coagulation Profile: No results for input(s): INR, PROTIME in the last 168 hours. Cardiac Enzymes: No results for input(s): CKTOTAL, CKMB, CKMBINDEX, TROPONINI in the last 168 hours. BNP (last 3 results) No results for input(s): PROBNP in the last 8760 hours. HbA1C: No results for input(s): HGBA1C in the last 72 hours. CBG: Recent Labs  Lab 06/08/2021 2303  GLUCAP 120*    Lipid Profile: Recent Labs    06/18/2021 1850  TRIG 68   Thyroid Function Tests: No results for input(s): TSH, T4TOTAL, FREET4, T3FREE, THYROIDAB in the last 72 hours. Anemia Panel: Recent Labs    06/05/2021 1850 06/05/21 0539  FERRITIN 262 268   Sepsis Labs: Recent Labs  Lab 06/02/2021 1850 06/14/2021 2119 06/05/21 0539  PROCALCITON 1.15  --  0.70  LATICACIDVEN 1.9 1.6  --     Recent Results (from the past 240 hour(s))  Blood Culture (routine x 2)     Status: None (Preliminary result)   Collection Time: 06/22/2021  6:50 PM   Specimen: BLOOD  Result Value Ref Range Status   Specimen Description BLOOD BLOOD LEFT HAND  Final  Special Requests   Final    BOTTLES DRAWN AEROBIC AND ANAEROBIC Blood Culture adequate volume   Culture   Final    NO GROWTH 2 DAYS Performed at [facility], [address] [place], [place] [address]    Report Status PENDING  Incomplete  Blood Culture (routine x 2)     Status: Abnormal (Preliminary result)   Collection Time: 06/17/2021  6:50 PM   Specimen: BLOOD  Result Value Ref Range Status   Specimen Description   Final    BLOOD BLOOD RIGHT FOREARM Performed at [facility], [address] [place], [place] [address]    Special Requests   Final    BOTTLES DRAWN AEROBIC AND ANAEROBIC Blood Culture adequate volume Performed at [facility], [address] [place], [place] [address]    Culture  Setup Time   Final    Organism ID to follow IN BOTH AEROBIC AND ANAEROBIC BOTTLES GRAM POSITIVE COCCI CRITICAL RESULT CALLED TO, READ BACK BY AND VERIFIED WITH: SUSAN WATSON @ 0762 ON 06/04/21.Marland KitchenMarland KitchenTKR Performed at [facility], [address] [place], [place] [address]    Culture (A)  Final    STAPHYLOCOCCUS CAPITIS THE SIGNIFICANCE OF ISOLATING THIS ORGANISM FROM A SINGLE SET OF BLOOD CULTURES WHEN MULTIPLE SETS ARE DRAWN IS UNCERTAIN. PLEASE NOTIFY THE MICROBIOLOGY DEPARTMENT WITHIN ONE WEEK IF SPECIATION AND  SENSITIVITIES ARE REQUIRED. Performed at [facility], [place] [address] [place], [place] [address]    Report Status PENDING  Incomplete  Blood Culture ID Panel (Reflexed)     Status: Abnormal   Collection Time: 06/08/2021  6:50 PM  Result Value Ref Range Status   Enterococcus faecalis NOT DETECTED NOT DETECTED Final   Enterococcus Faecium NOT DETECTED NOT DETECTED Final   Listeria monocytogenes NOT DETECTED NOT DETECTED Final   Staphylococcus species DETECTED (A) NOT DETECTED Final    Comment: CRITICAL RESULT CALLED TO, READ BACK BY AND VERIFIED WITH: SUSAN WATSON @ 1344 ON 06/04/21.Marland KitchenMarland KitchenTKR    Staphylococcus aureus (BCID) NOT DETECTED NOT DETECTED Final   Staphylococcus epidermidis NOT DETECTED NOT DETECTED Final   Staphylococcus lugdunensis NOT DETECTED NOT DETECTED Final   Streptococcus species NOT DETECTED NOT DETECTED Final   Streptococcus agalactiae NOT DETECTED NOT DETECTED Final   Streptococcus pneumoniae NOT DETECTED NOT DETECTED Final   Streptococcus pyogenes NOT DETECTED NOT DETECTED Final   A.calcoaceticus-baumannii NOT DETECTED NOT DETECTED Final   Bacteroides fragilis NOT DETECTED NOT DETECTED Final   Enterobacterales NOT DETECTED NOT DETECTED Final   Enterobacter cloacae complex NOT DETECTED NOT DETECTED Final   Escherichia coli NOT DETECTED NOT DETECTED Final   Klebsiella aerogenes NOT DETECTED NOT DETECTED Final   Klebsiella oxytoca NOT DETECTED NOT DETECTED Final   Klebsiella pneumoniae NOT DETECTED NOT DETECTED Final   Proteus species NOT DETECTED NOT DETECTED Final   Salmonella species NOT DETECTED NOT DETECTED Final   Serratia marcescens NOT DETECTED NOT DETECTED Final   Haemophilus influenzae NOT DETECTED NOT DETECTED Final   Neisseria meningitidis NOT DETECTED NOT DETECTED Final   Pseudomonas aeruginosa NOT DETECTED NOT DETECTED Final   Stenotrophomonas maltophilia NOT DETECTED NOT DETECTED Final   Candida albicans NOT DETECTED NOT DETECTED Final   Candida  auris NOT DETECTED NOT DETECTED Final   Candida glabrata NOT DETECTED NOT DETECTED Final   Candida krusei NOT DETECTED NOT DETECTED Final   Candida parapsilosis NOT DETECTED NOT DETECTED Final   Candida tropicalis NOT DETECTED NOT DETECTED Final  Cryptococcus neoformans/gattii NOT DETECTED NOT DETECTED Final    Comment: Performed at [facility], [address] [place], [place] [address]  Urine Culture     Status: Abnormal (Preliminary result)   Collection Time: 06/04/21  6:32 AM   Specimen: Urine, Clean Catch  Result Value Ref Range Status   Specimen Description   Final    URINE, CLEAN CATCH Performed at [facility], [address] [place], [place] [address]    Special Requests   Final    NONE Performed at [facility], [address], [place], [place] [address]    Culture (A)  Final    >=100,000 COLONIES/mL PSEUDOMONAS AERUGINOSA SUSCEPTIBILITIES TO FOLLOW Performed at [facility], [place] [address] [place], [place] [address]    Report Status PENDING  Incomplete      Radiology Studies: DG Chest Port 1 View  Result Date: 06/09/2021 CLINICAL DATA:  Shortness of breath EXAM: PORTABLE CHEST 1 VIEW COMPARISON:  10/15/2020, 09/10/2020 CT FINDINGS: Post sternotomy changes. Incompletely included lung bases. Diffuse bilateral right greater than left interstitial and alveolar disease. Normal cardiac size with aortic atherosclerosis. No pneumothorax IMPRESSION: Interval right greater than left interstitial and alveolar disease, favored to represent pneumonia Electronically Signed   By: Donavan Foil M.D.   On: 06/23/2021 20:18     Scheduled Meds:  apixaban  2.5 mg Oral BID   vitamin C  500 mg Oral Daily   Chlorhexidine Gluconate Cloth  6 each Topical Daily   darolutamide  600 mg Oral BID   dexamethasone (DECADRON) injection  6 mg Intravenous Q24H   Ipratropium-Albuterol  1 puff Inhalation Q6H   mouth rinse  15 mL Mouth Rinse BID   mirtazapine   15 mg Oral QHS   multivitamin with minerals  1 tablet Oral Daily   pantoprazole  40 mg Oral Daily   senna  1 tablet Oral QHS   tamsulosin  0.4 mg Oral QPC supper   zinc sulfate  220 mg Oral Daily   Continuous Infusions:  azithromycin Stopped (06/05/21 0310)   piperacillin-tazobactam (ZOSYN)  IV 3.375 g (06/05/21 1515)   remdesivir 100 mg in NS 100 mL 100 mg (06/05/21 1030)     LOS: 2 days   Billed for critical care, 40 minutes.  Pt's respiratory status declined throughout the day, up to 14L and then nonrebreather.  Also went into Afib w RVR requiring medical management.  Multi-organ failure.   Enzo Bi, MD [facility] If 7PM-7AM, please contact night-coverage 06/05/2021, 5:26 PM

## 2021-06-05 NOTE — Progress Notes (Signed)
Pt's respiratory status has been deteriorating all day, now on non-rebreather with RR in the 40's.    Talked with son Tyland Klemens again on the phone, confirmed no intubation.  Can not use BiPAP due to excessive secretion due to pt not being able to clear his airway.  I let the son know that with RR in the 40's, pt is struggling to breath, and will likely tire out soon.  Son and family have now decided to make pt comfort care.  IV morphine ordered to treat pt's pain and air hunger.

## 2021-06-05 NOTE — Progress Notes (Signed)
Pt made comfort care around 2100, family at bedside, young kids present and spoken with guardians about pt being under COVID precautions, however they refused to keep the kids out of the room, family advised about visitor policy. IV morphine given as pt began diagonally breathing.

## 2021-06-05 NOTE — Plan of Care (Signed)
  Problem: Clinical Measurements: Goal: Ability to maintain clinical measurements within normal limits will improve Outcome: Progressing Goal: Diagnostic test results will improve Outcome: Progressing Goal: Respiratory complications will improve Outcome: Progressing   Problem: Safety: Goal: Ability to remain free from injury will improve Outcome: Progressing   Problem: Activity: Goal: Risk for activity intolerance will decrease Outcome: Not Progressing   Problem: Nutrition: Goal: Adequate nutrition will be maintained Outcome: Not Progressing

## 2021-06-05 NOTE — Consult Note (Signed)
Consultation Note Date: 06/05/2021   Patient Name: Carl [name]  DOB: 03/08/1936  MRN: [id number]  Age / Sex: 85 y.o., male  PCP: Alvester Morin, MD Referring Physician: Enzo Bi, MD  Reason for Consultation:  GOC  HPI/Patient Profile: 85 y.o. male  with past medical history of CHF, AAA, chronic anemia, CKD III CAD s/p CABG in 2006, recurrent UTIs, HLD, resident in SNF, s/p hip fracture, sacral wound, protein calorie malnutrition,  admitted on 06/24/2021 with COVID +, UTI, pnuemonia. Palliative medicine consulted for [place].    Primary Decision Maker NEXT OF KIN  Discussion: [name] is not arousing. He is not eating or drinking.  I called his son- [name], [name] for [place] discussion.  [name] is followed at his facility by [facility]- last seen on Nov. 4th- at that time [place] were for treat the treatable, and DNR.  Patient has lived at [facility] for several months. He is bedbound at baseline. Was only able to eat pureed foods due to dysphagia/aspiration. Cognitively he was able to make his needs known but his speech was sometimes unintelligible. He found joy in watching sports- baseball, racing, basketball- he is a [employer] farm manager.  GOC were discussed. Patient has DNR in place.  Patient's son wishes to continue current care in efforts to restore patient to his previous level of function. However, would not want any invasive care. No feeding tube.  We discussed concerns for patient's poor functional state previous to this admission and how that can affect his ability to recover from this most recent insult.      SUMMARY OF RECOMMENDATIONS -DNR -No feeding tube -Continue IV fluids, antibiotics, monitor for recovery -PMT will continue to follow and discuss with family if patient is not improving    Code Status/Advance Care Planning: DNR   Prognosis:   Unable to  determine  Discharge Planning: [place] for rehab with Palliative care service follow-up  Primary Diagnoses: Present on Admission:  Acute respiratory failure due to COVID-19 [facility])   Review of Systems  Physical Exam  Vital Signs: BP 105/67   Pulse 98   Temp (!) 97.1 F (36.2 C) (Axillary)   Resp (!) 24   Ht 5\' 9"  (1.753 m)   Wt 55.6 kg   SpO2 95%   BMI 18.10 kg/m  Pain Scale: CPOT   Pain Score: 0-No pain   SpO2: SpO2: 95 % O2 Device:SpO2: 95 % O2 Flow Rate: .O2 Flow Rate (L/min): 4 L/min (acute)  IO: Intake/output summary:  Intake/Output Summary (Last 24 hours) at 06/05/2021 1137 Last data filed at 06/05/2021 0618 Gross per 24 hour  Intake 861.71 ml  Output 250 ml  Net 611.71 ml    LBM: Last BM Date: 06/05/2021 Baseline Weight: Weight: 59.3 kg Most recent weight: Weight: 55.6 kg     Palliative Assessment/Data: PPS: 10%       Thank you for this consult. Palliative medicine will continue to follow and assist as needed.   Time In: 1035 Time Out:  1147 Time Total: 72 mins Greater than 50%  of this time was spent counseling and coordinating care related to the above assessment and plan.  Signed by: Mariana Kaufman, AGNP-C Palliative Medicine    Please contact Palliative Medicine Team phone at [phone] for questions and concerns.  For individual provider: See Shea Evans

## 2021-06-05 NOTE — Progress Notes (Addendum)
Initial Nutrition Assessment  DOCUMENTATION CODES:   Underweight, Severe malnutrition in context of chronic illness  INTERVENTION:   -RD will follow for diet advancement and add supplements as appropriate  NUTRITION DIAGNOSIS:   Severe Malnutrition related to acute illness (COVID-19) as evidenced by percent weight loss, severe fat depletion, severe muscle depletion.  GOAL:   Patient will meet greater than or equal to 90% of their needs  MONITOR:   PO intake, Supplement acceptance, Diet advancement, Labs, Weight trends, Skin, I & O's  REASON FOR ASSESSMENT:   Rounds    ASSESSMENT:   85 y.o with significant multiple PMH as below who presented to the ED via [facility] from [place] with progressive SOB.   Pt D/X with Covid on 11/4 and now on 4L O2, previously on RA. EMS states O2 sats down to 84 on the 4L and lethargy at facility.  Pt admitted with acute hypoxic respiratory failure secondary to COVID-19 pneumonia.   Reviewed I/O's: +612 ml x 24 hours and +2 L since admission  UOP: 250 ml x 24 hours   Spoke with pt at bedside, who was very soft spoken. He repeats to this RD that he believes that he is dying, secondary to difficulty breathing. RD provided comfort and emotional support. Also covered pt with blanket, due to complaint of being cold.   Pt reports that he had a poor appetite PTA. He is unable to provide further details. Per palliative care notes, pt with dysphagia and had difficulty tolerating pureed foods PTA.   Reviewed wt hx; pt has experienced a 11.7% wt loss over the past 7 months, which is significant for time frame.   Per palliative care notes, pt and family do not desire a feeding tube.   Suspect pt with malnutrition secondary to chronic illness at baseline, however, malnutrition is exacerbated secondary to COVID-19.  Medications reviewed and include vitamin C, decadron, remeron, senokot, zinc sulfate, and remdesivir.   Labs reviewed:  CBGS: 120 (inpatient orders for glycemic control are none).    NUTRITION - FOCUSED PHYSICAL EXAM:  Flowsheet Row Most Recent Value  Orbital Region Severe depletion  Upper Arm Region Severe depletion  Thoracic and Lumbar Region Severe depletion  Buccal Region Severe depletion  Temple Region Severe depletion  Clavicle Bone Region Severe depletion  Clavicle and Acromion Bone Region Severe depletion  Scapular Bone Region Severe depletion  Dorsal Hand Severe depletion  Patellar Region Severe depletion  Anterior Thigh Region Severe depletion  Posterior Calf Region Severe depletion  Edema (RD Assessment) None  Hair Reviewed  Eyes Reviewed  Mouth Reviewed  Skin Reviewed  Nails Reviewed       Diet Order:   Diet Order             Diet NPO time specified  Diet effective now                   EDUCATION NEEDS:   Education needs have been addressed  Skin:  Skin Assessment: Skin Integrity Issues: Skin Integrity Issues:: Stage II Stage II: coccyx  Last BM:  06/21/2021  Height:   Ht Readings from Last 1 Encounters:  06/07/2021 5\' 9"  (1.753 m)    Weight:   Wt Readings from Last 1 Encounters:  06/05/21 55.6 kg    Ideal Body Weight:  72.7 kg  BMI:  Body mass index is 18.1 kg/m.  Estimated Nutritional Needs:   Kcal:  2000-2200  Protein:  110-125 grams  Fluid:  >  [place], RD, LDN, [place] Registered Dietitian II Certified Diabetes Care and Education Specialist Please refer to [facility] for RD and/or RD on-call/weekend/after hours pager

## 2021-06-06 LAB — URINE CULTURE: Culture: >=100000 — AB

## 2021-06-06 LAB — CULTURE, BLOOD (ROUTINE X 2): Special Requests: ADEQUATE

## 2021-06-08 LAB — CULTURE, BLOOD (ROUTINE X 2)
Culture: NO GROWTH
Special Requests: ADEQUATE

## 2021-06-27 NOTE — Progress Notes (Signed)
Chaplain Maggie responded to page to [facility] after pt passed. Family declined chaplain's visitation.

## 2021-06-27 NOTE — Progress Notes (Signed)
Pt passed away at 0117, family was at bedside and refused to follow hospital policy about certain number of visitors inside pt's isolation room. Leaving door to [place] room open, not taking off PPE when leaving room and not wearing masks in the ICU unit. Even after constant advisory about infection risk towards other patients and hospital staff. Security was called incase of an altercation due to noncompliance, and chaplain was called for family support. no belongings were left at bedside.

## 2021-06-27 NOTE — Discharge Summary (Addendum)
Death Summary  Carl TIBERIO SrMarland [name] AST:[id number] DOB: Jun 06, 1936 DOA: 06-13-21  PCP: Alvester Morin, MD  Admit date: 2021/06/13 Date of Death: 2021/06/16 Time of Death: 01:17   History of present illness:  Carl Heller Sr. Is a 85 y.o with hx of A. fib, CAD status post CABG x5 in 2006, hypertension, iron deficiency anemia, orthostatic hypotension, prostate cancer, HFpEF, dysphagia who presented to the ED from [place] with progressive SOB.   Pt was dx with Covid on 11/4.  EMS states O2 sats down to 84 on the 4L and lethargy at facility.   Acute Hypoxic Respiratory Failure secondary to  COVID-19 Pneumonia  Superimposed Bacterial Pneumonia --started on tx for both covid and bacterial PNA on admission. --O2 requirement worsened throughout the day on 11/9, from 5L up to 14L and then needed non-rebreather and heated hf.  With RR in the 40's.   --palliative care provider had spoken to son (health care POA) in the morning and confirmed DNR and no invasive procedures, but initially, son still wanted pt to be fully treated.  However, towards the evening time, pt was on max oxygen support short of BiPAP (could not use BiPAP due to excessive secretion and pt not being able to clear his airway) and intubation.  Pt was struggling to breath.  I personally had 2 separate goals of care conversation with the son throughout the day and night, and finally after son and family came to see the pt, son decided to make pt comfort care.   --Pt was then given IV morphine PRN to relieve his air hunger and the rapid breathing.  All family were able to visit before pt passed early morning of Jun 17, 2023.   UTI 2/2 pseudomonas    Sepsis, POA --criteria met with tachycardia, leukocytosis, source PNA and/or UTI   Persistent Atrial Fibrillation with RVR   Acute Metabolic Encephalopathy in setting of Sepsis and COVID-19 --kept NPO for now due to lethargy and risk of aspiration    Dysphagia --Pt has underlying dysphagia with known silent aspiration. --unable to clear his own secretion. --kept NPO due to significant aspiration risk and lethargy   Chronic low back pain --d/c scheduled tramadol   Prostate cancer -unable to take home darolutamide due to NPO status.   Underweight, Severe malnutrition in context of chronic illness --no tube feed, per son   Discharge Diagnoses:  Principal Problem:   Acute respiratory failure due to COVID-19 [facility]) Active Problems:   Hx of CABG x 5 '06   Chronic kidney disease (CKD), stage III (moderate) (HCC)   Prostate CA (HCC)   Longstanding persistent atrial fibrillation (HCC)   Generalized weakness   Pneumonia   Pseudomonas urinary tract infection   Protein-calorie malnutrition, severe   Iron deficiency anemia   Sepsis (HCC)   Dysphagia   Pressure ulcer   Aspiration into airway   Hypoalbuminemia   Acute metabolic encephalopathy    The results of significant diagnostics from this hospitalization (including imaging, microbiology, ancillary and laboratory) are listed below for reference.    Significant Diagnostic Studies: DG Chest Port 1 View  Result Date: 13-Jun-2021 CLINICAL DATA:  Shortness of breath EXAM: PORTABLE CHEST 1 VIEW COMPARISON:  10/15/2020, 09/10/2020 CT FINDINGS: Post sternotomy changes. Incompletely included lung bases. Diffuse bilateral right greater than left interstitial and alveolar disease. Normal cardiac size with aortic atherosclerosis. No pneumothorax IMPRESSION: Interval right greater than left interstitial and alveolar disease, favored to represent pneumonia Electronically Signed   By: Maudie Mercury  Francoise Ceo M.D.   On: 06/05/2021 20:18    Microbiology: Recent Results (from the past 240 hour(s))  Blood Culture (routine x 2)     Status: None (Preliminary result)   Collection Time: 06/09/2021  6:50 PM   Specimen: BLOOD  Result Value Ref Range Status   Specimen Description BLOOD BLOOD LEFT HAND  Final    Special Requests   Final    BOTTLES DRAWN AEROBIC AND ANAEROBIC Blood Culture adequate volume   Culture   Final    NO GROWTH 3 DAYS Performed at [facility], [address] [place], [place] [address]    Report Status PENDING  Incomplete  Blood Culture (routine x 2)     Status: Abnormal (Preliminary result)   Collection Time: 06/22/2021  6:50 PM   Specimen: BLOOD  Result Value Ref Range Status   Specimen Description   Final    BLOOD BLOOD RIGHT FOREARM Performed at [facility], [address] [place], [place] [address]    Special Requests   Final    BOTTLES DRAWN AEROBIC AND ANAEROBIC Blood Culture adequate volume Performed at [facility], [address] [place], [place] [address]    Culture  Setup Time   Final    Organism ID to follow IN BOTH AEROBIC AND ANAEROBIC BOTTLES GRAM POSITIVE COCCI CRITICAL RESULT CALLED TO, READ BACK BY AND VERIFIED WITH: SUSAN WATSON @ 1344 ON 06/04/21.Carl KitchenMarland KitchenTKR Performed at [facility], [address] [place],  [address]    Culture (A)  Final    STAPHYLOCOCCUS CAPITIS THE SIGNIFICANCE OF ISOLATING THIS ORGANISM FROM A SINGLE SET OF BLOOD CULTURES WHEN MULTIPLE SETS ARE DRAWN IS UNCERTAIN. PLEASE NOTIFY THE MICROBIOLOGY DEPARTMENT WITHIN ONE WEEK IF SPECIATION AND SENSITIVITIES ARE REQUIRED. Performed at [facility], [place] [address] [place],  [address]    Report Status PENDING  Incomplete  Blood Culture ID Panel (Reflexed)     Status: Abnormal   Collection Time: 06/26/2021  6:50 PM  Result Value Ref Range Status   Enterococcus faecalis NOT DETECTED NOT DETECTED Final   Enterococcus Faecium NOT DETECTED NOT DETECTED Final   Listeria monocytogenes NOT DETECTED NOT DETECTED Final   Staphylococcus species DETECTED (A) NOT DETECTED Final    Comment: CRITICAL RESULT CALLED TO, READ BACK BY AND VERIFIED WITH: SUSAN WATSON @ 1344 ON 06/04/21.Carl KitchenMarland KitchenTKR    Staphylococcus aureus (BCID) NOT DETECTED NOT  DETECTED Final   Staphylococcus epidermidis NOT DETECTED NOT DETECTED Final   Staphylococcus lugdunensis NOT DETECTED NOT DETECTED Final   Streptococcus species NOT DETECTED NOT DETECTED Final   Streptococcus agalactiae NOT DETECTED NOT DETECTED Final   Streptococcus pneumoniae NOT DETECTED NOT DETECTED Final   Streptococcus pyogenes NOT DETECTED NOT DETECTED Final   A.calcoaceticus-baumannii NOT DETECTED NOT DETECTED Final   Bacteroides fragilis NOT DETECTED NOT DETECTED Final   Enterobacterales NOT DETECTED NOT DETECTED Final   Enterobacter cloacae complex NOT DETECTED NOT DETECTED Final   Escherichia coli NOT DETECTED NOT DETECTED Final   Klebsiella aerogenes NOT DETECTED NOT DETECTED Final   Klebsiella oxytoca NOT DETECTED NOT DETECTED Final   Klebsiella pneumoniae NOT DETECTED NOT DETECTED Final   Proteus species NOT DETECTED NOT DETECTED Final   Salmonella species NOT DETECTED NOT DETECTED Final   Serratia marcescens NOT DETECTED NOT DETECTED Final   Haemophilus influenzae NOT DETECTED NOT DETECTED Final   Neisseria meningitidis NOT DETECTED NOT DETECTED Final   Pseudomonas aeruginosa NOT DETECTED NOT DETECTED Final   Stenotrophomonas  maltophilia NOT DETECTED NOT DETECTED Final   Candida albicans NOT DETECTED NOT DETECTED Final   Candida auris NOT DETECTED NOT DETECTED Final   Candida glabrata NOT DETECTED NOT DETECTED Final   Candida krusei NOT DETECTED NOT DETECTED Final   Candida parapsilosis NOT DETECTED NOT DETECTED Final   Candida tropicalis NOT DETECTED NOT DETECTED Final   Cryptococcus neoformans/gattii NOT DETECTED NOT DETECTED Final    Comment: Performed at [facility], [address] [place], [place] [address]  Urine Culture     Status: Abnormal (Preliminary result)   Collection Time: 06/04/21  6:32 AM   Specimen: Urine, Clean Catch  Result Value Ref Range Status   Specimen Description   Final    URINE, CLEAN CATCH Performed at [facility],  [address], [place], [place] [address]    Special Requests   Final    NONE Performed at [facility], [address], [place], [place] [address]    Culture (A)  Final    >=100,000 COLONIES/mL PSEUDOMONAS AERUGINOSA SUSCEPTIBILITIES TO FOLLOW Performed at [facility], [place] [address], [place], [place] [address]    Report Status PENDING  Incomplete     Labs: Basic Metabolic Panel: Recent Labs  Lab 06/01/2021 1850 06/04/21 0456 06/05/21 0539  NA 134*  --  142  K 3.8  --  3.9  CL 105  --  112*  CO2 22  --  18*  GLUCOSE 123*  --  99  BUN 32*  --  35*  CREATININE 0.85  --  0.67  CALCIUM 7.8*  --  8.8*  MG  --  2.0 2.0  PHOS  --  3.4 3.1   Liver Function Tests: Recent Labs  Lab 06/26/2021 1850 06/05/21 0539  AST 21 14*  ALT 12 9  ALKPHOS 58 50  BILITOT 0.8 1.0  PROT 6.0* 5.4*  ALBUMIN 2.3* 1.9*   No results for input(s): LIPASE, AMYLASE in the last 168 hours. No results for input(s): AMMONIA in the last 168 hours. CBC: Recent Labs  Lab 06/18/2021 1850 06/05/21 0539  WBC 11.1* 7.6  NEUTROABS 10.3* 6.9  HGB 11.0* 10.0*  HCT 33.7* 32.5*  MCV 92.3 95.3  PLT 199 201   Cardiac Enzymes: No results for input(s): CKTOTAL, CKMB, CKMBINDEX, TROPONINI in the last 168 hours. D-Dimer Recent Labs    06/05/21 0539  DDIMER 1.07*   BNP: Invalid input(s): POCBNP CBG: Recent Labs  Lab 06/05/2021 2303  GLUCAP 120*   Anemia work up Recent Labs    06/01/2021 1850 06/05/21 0539  FERRITIN 262 268   Urinalysis    Component Value Date/Time   COLORURINE AMBER (A) 06/04/2021 0632   APPEARANCEUR HAZY (A) 06/04/2021 0632   LABSPEC 1.028 06/04/2021 0632   PHURINE 5.0 06/04/2021 0632   GLUCOSEU NEGATIVE 06/04/2021 0632   HGBUR LARGE (A) 06/04/2021 0632   BILIRUBINUR NEGATIVE 06/04/2021 0632   BILIRUBINUR negative 01/17/2020 [place] 06/04/2021 0632   PROTEINUR 30 (A) 06/04/2021 0632   UROBILINOGEN 0.2 01/17/2020 1227   NITRITE  NEGATIVE 06/04/2021 0632   LEUKOCYTESUR MODERATE (A) 06/04/2021 0632   Sepsis Labs Invalid input(s): PROCALCITONIN,  WBC,  LACTICIDVEN     SIGNED:  Enzo Bi, MD  [facility] Jun 23, 2021, 8:32 AM Pager   No charge note.  Pt passed away during the night shift.

## 2021-06-27 DEATH — deceased
